# Patient Record
Sex: Female | Born: 1960
Health system: Southern US, Community
[De-identification: ages and names within clinical notes are randomized; demographics above are authoritative.]

## PROBLEM LIST (undated history)

## (undated) DIAGNOSIS — Z87898 Personal history of other specified conditions: Secondary | ICD-10-CM

## (undated) DIAGNOSIS — R51 Headache: Secondary | ICD-10-CM

## (undated) DIAGNOSIS — M545 Low back pain, unspecified: Secondary | ICD-10-CM

## (undated) DIAGNOSIS — Z8614 Personal history of Methicillin resistant Staphylococcus aureus infection: Secondary | ICD-10-CM

## (undated) DIAGNOSIS — M869 Osteomyelitis, unspecified: Secondary | ICD-10-CM

## (undated) DIAGNOSIS — Z8701 Personal history of pneumonia (recurrent): Secondary | ICD-10-CM

## (undated) DIAGNOSIS — E876 Hypokalemia: Secondary | ICD-10-CM

## (undated) DIAGNOSIS — E669 Obesity, unspecified: Secondary | ICD-10-CM

## (undated) DIAGNOSIS — Z419 Encounter for procedure for purposes other than remedying health state, unspecified: Secondary | ICD-10-CM

## (undated) DIAGNOSIS — F329 Major depressive disorder, single episode, unspecified: Secondary | ICD-10-CM

## (undated) DIAGNOSIS — R000 Tachycardia, unspecified: Secondary | ICD-10-CM

## (undated) DIAGNOSIS — I739 Peripheral vascular disease, unspecified: Secondary | ICD-10-CM

## (undated) DIAGNOSIS — I639 Cerebral infarction, unspecified: Secondary | ICD-10-CM

## (undated) DIAGNOSIS — R0602 Shortness of breath: Secondary | ICD-10-CM

## (undated) DIAGNOSIS — K219 Gastro-esophageal reflux disease without esophagitis: Secondary | ICD-10-CM

## (undated) DIAGNOSIS — F419 Anxiety disorder, unspecified: Secondary | ICD-10-CM

## (undated) DIAGNOSIS — D649 Anemia, unspecified: Secondary | ICD-10-CM

## (undated) DIAGNOSIS — R339 Retention of urine, unspecified: Secondary | ICD-10-CM

## (undated) DIAGNOSIS — N390 Urinary tract infection, site not specified: Secondary | ICD-10-CM

## (undated) DIAGNOSIS — F32A Depression, unspecified: Secondary | ICD-10-CM

## (undated) DIAGNOSIS — E785 Hyperlipidemia, unspecified: Secondary | ICD-10-CM

## (undated) DIAGNOSIS — I10 Essential (primary) hypertension: Secondary | ICD-10-CM

## (undated) DIAGNOSIS — J189 Pneumonia, unspecified organism: Secondary | ICD-10-CM

## (undated) DIAGNOSIS — N19 Unspecified kidney failure: Secondary | ICD-10-CM

## (undated) DIAGNOSIS — G609 Hereditary and idiopathic neuropathy, unspecified: Secondary | ICD-10-CM

## (undated) DIAGNOSIS — E119 Type 2 diabetes mellitus without complications: Secondary | ICD-10-CM

## (undated) DIAGNOSIS — M1990 Unspecified osteoarthritis, unspecified site: Secondary | ICD-10-CM

## (undated) DIAGNOSIS — Z418 Encounter for other procedures for purposes other than remedying health state: Secondary | ICD-10-CM

## (undated) DIAGNOSIS — G4730 Sleep apnea, unspecified: Secondary | ICD-10-CM

## (undated) HISTORY — DX: Unspecified osteoarthritis, unspecified site: M19.90

## (undated) HISTORY — DX: Unspecified kidney failure: N19

## (undated) HISTORY — PX: TOE AMPUTATION: SHX809

## (undated) HISTORY — DX: Type 2 diabetes mellitus without complications: E11.9

## (undated) HISTORY — DX: Low back pain, unspecified: M54.50

## (undated) HISTORY — DX: Retention of urine, unspecified: R33.9

## (undated) HISTORY — DX: Hyperlipidemia, unspecified: E78.5

## (undated) HISTORY — DX: Hereditary and idiopathic neuropathy, unspecified: G60.9

## (undated) HISTORY — DX: Gastro-esophageal reflux disease without esophagitis: K21.9

## (undated) HISTORY — DX: Personal history of pneumonia (recurrent): Z87.01

## (undated) HISTORY — DX: Hypokalemia: E87.6

## (undated) HISTORY — DX: Major depressive disorder, single episode, unspecified: F32.9

## (undated) HISTORY — DX: Anxiety disorder, unspecified: F41.9

## (undated) HISTORY — DX: Encounter for procedure for purposes other than remedying health state, unspecified: Z41.9

## (undated) HISTORY — DX: Depression, unspecified: F32.A

## (undated) HISTORY — DX: Anemia, unspecified: D64.9

## (undated) HISTORY — DX: Osteomyelitis, unspecified: M86.9

## (undated) HISTORY — DX: Personal history of other specified conditions: Z87.898

## (undated) HISTORY — PX: DILATION AND CURETTAGE OF UTERUS: SHX78

## (undated) HISTORY — DX: Obesity, unspecified: E66.9

## (undated) HISTORY — PX: TYMPANOPLASTY: SHX33

## (undated) HISTORY — DX: Headache: R51

## (undated) HISTORY — DX: Personal history of Methicillin resistant Staphylococcus aureus infection: Z86.14

## (undated) HISTORY — DX: Cerebral infarction, unspecified: I63.9

## (undated) HISTORY — DX: Low back pain: M54.5

## (undated) HISTORY — DX: Encounter for other procedures for purposes other than remedying health state: Z41.8

## (undated) HISTORY — DX: Sleep apnea, unspecified: G47.30

## (undated) HISTORY — DX: Essential (primary) hypertension: I10

---

## 1997-09-15 ENCOUNTER — Emergency Department (HOSPITAL_COMMUNITY): Admission: EM | Admit: 1997-09-15 | Discharge: 1997-09-15 | Payer: Self-pay | Admitting: Emergency Medicine

## 1997-10-01 ENCOUNTER — Emergency Department (HOSPITAL_COMMUNITY): Admission: EM | Admit: 1997-10-01 | Discharge: 1997-10-01 | Payer: Self-pay | Admitting: Emergency Medicine

## 1999-12-05 ENCOUNTER — Emergency Department (HOSPITAL_COMMUNITY): Admission: EM | Admit: 1999-12-05 | Discharge: 1999-12-05 | Payer: Self-pay | Admitting: Emergency Medicine

## 1999-12-05 ENCOUNTER — Encounter: Payer: Self-pay | Admitting: Emergency Medicine

## 2000-01-09 ENCOUNTER — Encounter: Admission: RE | Admit: 2000-01-09 | Discharge: 2000-04-08 | Payer: Self-pay | Admitting: Internal Medicine

## 2000-07-25 ENCOUNTER — Encounter: Admission: RE | Admit: 2000-07-25 | Discharge: 2000-10-23 | Payer: Self-pay | Admitting: Internal Medicine

## 2006-08-30 ENCOUNTER — Emergency Department (HOSPITAL_COMMUNITY): Admission: EM | Admit: 2006-08-30 | Discharge: 2006-08-30 | Payer: Self-pay | Admitting: Emergency Medicine

## 2006-10-11 ENCOUNTER — Ambulatory Visit: Payer: Self-pay | Admitting: Family Medicine

## 2006-10-11 LAB — CONVERTED CEMR LAB
Albumin: 3.3 g/dL — ABNORMAL LOW (ref 3.5–5.2)
Basophils Absolute: 0.1 10*3/uL (ref 0.0–0.1)
Bilirubin, Direct: 0.1 mg/dL (ref 0.0–0.3)
Cholesterol: 189 mg/dL (ref 0–200)
Creatinine,U: 182.6 mg/dL
Eosinophils Absolute: 0 10*3/uL (ref 0.0–0.6)
Eosinophils Relative: 0.7 % (ref 0.0–5.0)
GFR calc non Af Amer: 115 mL/min
Glucose, Bld: 290 mg/dL — ABNORMAL HIGH (ref 70–99)
HCT: 39.4 % (ref 36.0–46.0)
Hemoglobin: 13 g/dL (ref 12.0–15.0)
Hgb A1c MFr Bld: 12.1 % — ABNORMAL HIGH (ref 4.6–6.0)
Lymphocytes Relative: 39.4 % (ref 12.0–46.0)
MCHC: 33.1 g/dL (ref 30.0–36.0)
MCV: 88.5 fL (ref 78.0–100.0)
Microalb, Ur: 1.4 mg/dL (ref 0.0–1.9)
Monocytes Absolute: 0.6 10*3/uL (ref 0.2–0.7)
Neutro Abs: 3.5 10*3/uL (ref 1.4–7.7)
Neutrophils Relative %: 50.4 % (ref 43.0–77.0)
Potassium: 3.9 meq/L (ref 3.5–5.1)
Sodium: 136 meq/L (ref 135–145)
TSH: 3.18 microintl units/mL (ref 0.35–5.50)
Total Bilirubin: 1.1 mg/dL (ref 0.3–1.2)
Total CHOL/HDL Ratio: 5.1
WBC: 6.9 10*3/uL (ref 4.5–10.5)

## 2006-10-17 ENCOUNTER — Encounter: Admission: RE | Admit: 2006-10-17 | Discharge: 2007-01-15 | Payer: Self-pay | Admitting: Internal Medicine

## 2007-01-03 DIAGNOSIS — F329 Major depressive disorder, single episode, unspecified: Secondary | ICD-10-CM

## 2007-01-03 DIAGNOSIS — R51 Headache: Secondary | ICD-10-CM

## 2007-01-03 DIAGNOSIS — E785 Hyperlipidemia, unspecified: Secondary | ICD-10-CM

## 2007-01-03 DIAGNOSIS — M545 Low back pain, unspecified: Secondary | ICD-10-CM | POA: Insufficient documentation

## 2007-01-03 DIAGNOSIS — F411 Generalized anxiety disorder: Secondary | ICD-10-CM

## 2007-01-03 DIAGNOSIS — R519 Headache, unspecified: Secondary | ICD-10-CM | POA: Insufficient documentation

## 2007-01-03 DIAGNOSIS — E118 Type 2 diabetes mellitus with unspecified complications: Secondary | ICD-10-CM

## 2007-01-03 DIAGNOSIS — M1990 Unspecified osteoarthritis, unspecified site: Secondary | ICD-10-CM | POA: Insufficient documentation

## 2007-01-03 DIAGNOSIS — K219 Gastro-esophageal reflux disease without esophagitis: Secondary | ICD-10-CM

## 2007-01-07 ENCOUNTER — Ambulatory Visit: Payer: Self-pay | Admitting: Family Medicine

## 2007-01-07 DIAGNOSIS — G609 Hereditary and idiopathic neuropathy, unspecified: Secondary | ICD-10-CM | POA: Insufficient documentation

## 2007-05-04 ENCOUNTER — Emergency Department (HOSPITAL_COMMUNITY): Admission: EM | Admit: 2007-05-04 | Discharge: 2007-05-04 | Payer: Self-pay | Admitting: Emergency Medicine

## 2008-06-23 ENCOUNTER — Ambulatory Visit: Payer: Self-pay | Admitting: *Deleted

## 2008-06-23 ENCOUNTER — Ambulatory Visit: Payer: Self-pay | Admitting: Cardiology

## 2008-06-23 ENCOUNTER — Inpatient Hospital Stay (HOSPITAL_COMMUNITY): Admission: EM | Admit: 2008-06-23 | Discharge: 2008-07-06 | Payer: Self-pay | Admitting: Emergency Medicine

## 2008-06-24 ENCOUNTER — Encounter (INDEPENDENT_AMBULATORY_CARE_PROVIDER_SITE_OTHER): Payer: Self-pay | Admitting: Orthopedic Surgery

## 2008-06-24 ENCOUNTER — Encounter (INDEPENDENT_AMBULATORY_CARE_PROVIDER_SITE_OTHER): Payer: Self-pay | Admitting: *Deleted

## 2008-06-25 ENCOUNTER — Ambulatory Visit: Payer: Self-pay | Admitting: Vascular Surgery

## 2008-06-28 ENCOUNTER — Encounter (INDEPENDENT_AMBULATORY_CARE_PROVIDER_SITE_OTHER): Payer: Self-pay | Admitting: *Deleted

## 2008-06-30 ENCOUNTER — Ambulatory Visit: Payer: Self-pay | Admitting: Physical Medicine & Rehabilitation

## 2008-07-06 ENCOUNTER — Inpatient Hospital Stay (HOSPITAL_COMMUNITY)
Admission: RE | Admit: 2008-07-06 | Discharge: 2008-07-19 | Payer: Self-pay | Admitting: Physical Medicine & Rehabilitation

## 2008-07-06 ENCOUNTER — Ambulatory Visit: Payer: Self-pay | Admitting: Physical Medicine & Rehabilitation

## 2008-08-23 ENCOUNTER — Encounter
Admission: RE | Admit: 2008-08-23 | Discharge: 2008-08-24 | Payer: Self-pay | Admitting: Physical Medicine & Rehabilitation

## 2008-08-24 ENCOUNTER — Ambulatory Visit: Payer: Self-pay | Admitting: Physical Medicine & Rehabilitation

## 2008-08-25 ENCOUNTER — Ambulatory Visit: Payer: Self-pay | Admitting: Family Medicine

## 2008-08-27 ENCOUNTER — Encounter: Payer: Self-pay | Admitting: Family Medicine

## 2008-08-27 LAB — CONVERTED CEMR LAB
Alkaline Phosphatase: 74 units/L (ref 39–117)
BUN: 17 mg/dL (ref 6–23)
Basophils Absolute: 0 10*3/uL (ref 0.0–0.1)
Basophils Relative: 0.7 % (ref 0.0–3.0)
Bilirubin, Direct: 0.1 mg/dL (ref 0.0–0.3)
CO2: 29 meq/L (ref 19–32)
Calcium: 9 mg/dL (ref 8.4–10.5)
Chloride: 100 meq/L (ref 96–112)
Creatinine, Ser: 0.8 mg/dL (ref 0.4–1.2)
Creatinine,U: 65.4 mg/dL
Eosinophils Absolute: 0 10*3/uL (ref 0.0–0.7)
Hgb A1c MFr Bld: 10.3 % — ABNORMAL HIGH (ref 4.6–6.5)
Lymphocytes Relative: 33.7 % (ref 12.0–46.0)
MCHC: 33.4 g/dL (ref 30.0–36.0)
MCV: 87.1 fL (ref 78.0–100.0)
Microalb Creat Ratio: 122.3 mg/g — ABNORMAL HIGH (ref 0.0–30.0)
Microalb, Ur: 8 mg/dL — ABNORMAL HIGH (ref 0.0–1.9)
Monocytes Absolute: 0.4 10*3/uL (ref 0.1–1.0)
Neutrophils Relative %: 57.5 % (ref 43.0–77.0)
Platelets: 321 10*3/uL (ref 150.0–400.0)
RBC: 4.1 M/uL (ref 3.87–5.11)
Total Bilirubin: 0.6 mg/dL (ref 0.3–1.2)

## 2008-09-07 ENCOUNTER — Encounter: Payer: Self-pay | Admitting: Family Medicine

## 2009-03-22 ENCOUNTER — Encounter
Admission: RE | Admit: 2009-03-22 | Discharge: 2009-03-25 | Payer: Self-pay | Admitting: Physical Medicine & Rehabilitation

## 2009-03-25 ENCOUNTER — Ambulatory Visit: Payer: Self-pay | Admitting: Physical Medicine & Rehabilitation

## 2009-11-18 ENCOUNTER — Emergency Department (HOSPITAL_COMMUNITY): Admission: EM | Admit: 2009-11-18 | Discharge: 2009-11-18 | Payer: Self-pay | Admitting: Emergency Medicine

## 2010-03-28 ENCOUNTER — Emergency Department (HOSPITAL_COMMUNITY): Admission: EM | Admit: 2010-03-28 | Discharge: 2010-03-28 | Payer: Self-pay | Admitting: Emergency Medicine

## 2010-06-10 ENCOUNTER — Inpatient Hospital Stay (HOSPITAL_COMMUNITY)
Admission: EM | Admit: 2010-06-10 | Discharge: 2010-06-19 | Disposition: A | Discharge status: Rehabilitation Facility | Payer: Self-pay | Source: Home / Self Care | Attending: Internal Medicine | Admitting: Internal Medicine

## 2010-06-11 ENCOUNTER — Encounter (INDEPENDENT_AMBULATORY_CARE_PROVIDER_SITE_OTHER): Payer: Self-pay | Admitting: Internal Medicine

## 2010-06-11 ENCOUNTER — Encounter: Payer: Self-pay | Admitting: Internal Medicine

## 2010-06-12 ENCOUNTER — Inpatient Hospital Stay (HOSPITAL_COMMUNITY)
Admission: EM | Admit: 2010-06-12 | Discharge: 2010-06-19 | Disposition: A | Discharge status: Rehabilitation Facility | Payer: Self-pay | Attending: Internal Medicine | Admitting: Internal Medicine

## 2010-06-12 ENCOUNTER — Encounter (INDEPENDENT_AMBULATORY_CARE_PROVIDER_SITE_OTHER): Payer: Self-pay | Admitting: Internal Medicine

## 2010-06-12 ENCOUNTER — Encounter: Payer: Self-pay | Admitting: Cardiovascular Disease

## 2010-06-12 ENCOUNTER — Other Ambulatory Visit: Payer: Self-pay | Admitting: Internal Medicine

## 2010-06-13 ENCOUNTER — Encounter: Payer: Self-pay | Admitting: Internal Medicine

## 2010-06-14 ENCOUNTER — Encounter (INDEPENDENT_AMBULATORY_CARE_PROVIDER_SITE_OTHER): Payer: Self-pay | Admitting: *Deleted

## 2010-06-14 LAB — CBC
HCT: 29.8 % — ABNORMAL LOW (ref 36.0–46.0)
Hemoglobin: 9.9 g/dL — ABNORMAL LOW (ref 12.0–15.0)
MCH: 28.2 pg (ref 26.0–34.0)
MCHC: 33.2 g/dL (ref 30.0–36.0)
MCV: 84.9 fL (ref 78.0–100.0)
Platelets: 290 10*3/uL (ref 150–400)
RBC: 3.51 MIL/uL — ABNORMAL LOW (ref 3.87–5.11)
RDW: 13.1 % (ref 11.5–15.5)
WBC: 9.5 10*3/uL (ref 4.0–10.5)

## 2010-06-14 LAB — GLUCOSE, CAPILLARY
Glucose-Capillary: 165 mg/dL — ABNORMAL HIGH (ref 70–99)
Glucose-Capillary: 164 mg/dL — ABNORMAL HIGH (ref 70–99)
Glucose-Capillary: 173 mg/dL — ABNORMAL HIGH (ref 70–99)
Glucose-Capillary: 116 mg/dL — ABNORMAL HIGH (ref 70–99)

## 2010-06-15 LAB — GLUCOSE, CAPILLARY
Glucose-Capillary: 123 mg/dL — ABNORMAL HIGH (ref 70–99)
Glucose-Capillary: 120 mg/dL — ABNORMAL HIGH (ref 70–99)
Glucose-Capillary: 165 mg/dL — ABNORMAL HIGH (ref 70–99)

## 2010-06-16 ENCOUNTER — Telehealth (INDEPENDENT_AMBULATORY_CARE_PROVIDER_SITE_OTHER): Payer: Self-pay | Admitting: *Deleted

## 2010-06-16 LAB — GLUCOSE, CAPILLARY
Glucose-Capillary: 138 mg/dL — ABNORMAL HIGH (ref 70–99)
Glucose-Capillary: 132 mg/dL — ABNORMAL HIGH (ref 70–99)
Glucose-Capillary: 153 mg/dL — ABNORMAL HIGH (ref 70–99)

## 2010-06-19 ENCOUNTER — Inpatient Hospital Stay (HOSPITAL_COMMUNITY)
Admission: RE | Admit: 2010-06-19 | Discharge: 2010-06-24 | Payer: Self-pay | Attending: Physical Medicine & Rehabilitation | Admitting: Physical Medicine & Rehabilitation

## 2010-06-26 LAB — DIFFERENTIAL
Basophils Absolute: 0 10*3/uL (ref 0.0–0.1)
Basophils Relative: 0 % (ref 0–1)
Eosinophils Absolute: 0.1 10*3/uL (ref 0.0–0.7)
Eosinophils Relative: 2 % (ref 0–5)
Lymphocytes Relative: 33 % (ref 12–46)
Lymphs Abs: 2.2 10*3/uL (ref 0.7–4.0)
Monocytes Absolute: 0.6 10*3/uL (ref 0.1–1.0)
Monocytes Relative: 9 % (ref 3–12)
Neutro Abs: 3.8 10*3/uL (ref 1.7–7.7)
Neutrophils Relative %: 56 % (ref 43–77)

## 2010-06-26 LAB — GLUCOSE, CAPILLARY
Glucose-Capillary: 145 mg/dL — ABNORMAL HIGH (ref 70–99)
Glucose-Capillary: 183 mg/dL — ABNORMAL HIGH (ref 70–99)
Glucose-Capillary: 144 mg/dL — ABNORMAL HIGH (ref 70–99)
Glucose-Capillary: 152 mg/dL — ABNORMAL HIGH (ref 70–99)
Glucose-Capillary: 174 mg/dL — ABNORMAL HIGH (ref 70–99)
Glucose-Capillary: 119 mg/dL — ABNORMAL HIGH (ref 70–99)
Glucose-Capillary: 124 mg/dL — ABNORMAL HIGH (ref 70–99)
Glucose-Capillary: 143 mg/dL — ABNORMAL HIGH (ref 70–99)
Glucose-Capillary: 146 mg/dL — ABNORMAL HIGH (ref 70–99)
Glucose-Capillary: 150 mg/dL — ABNORMAL HIGH (ref 70–99)
Glucose-Capillary: 154 mg/dL — ABNORMAL HIGH (ref 70–99)
Glucose-Capillary: 155 mg/dL — ABNORMAL HIGH (ref 70–99)
Glucose-Capillary: 157 mg/dL — ABNORMAL HIGH (ref 70–99)
Glucose-Capillary: 168 mg/dL — ABNORMAL HIGH (ref 70–99)
Glucose-Capillary: 107 mg/dL — ABNORMAL HIGH (ref 70–99)
Glucose-Capillary: 119 mg/dL — ABNORMAL HIGH (ref 70–99)
Glucose-Capillary: 132 mg/dL — ABNORMAL HIGH (ref 70–99)
Glucose-Capillary: 143 mg/dL — ABNORMAL HIGH (ref 70–99)
Glucose-Capillary: 145 mg/dL — ABNORMAL HIGH (ref 70–99)
Glucose-Capillary: 145 mg/dL — ABNORMAL HIGH (ref 70–99)
Glucose-Capillary: 152 mg/dL — ABNORMAL HIGH (ref 70–99)
Glucose-Capillary: 154 mg/dL — ABNORMAL HIGH (ref 70–99)
Glucose-Capillary: 163 mg/dL — ABNORMAL HIGH (ref 70–99)
Glucose-Capillary: 168 mg/dL — ABNORMAL HIGH (ref 70–99)
Glucose-Capillary: 181 mg/dL — ABNORMAL HIGH (ref 70–99)
Glucose-Capillary: 187 mg/dL — ABNORMAL HIGH (ref 70–99)
Glucose-Capillary: 198 mg/dL — ABNORMAL HIGH (ref 70–99)
Glucose-Capillary: 121 mg/dL — ABNORMAL HIGH (ref 70–99)
Glucose-Capillary: 126 mg/dL — ABNORMAL HIGH (ref 70–99)
Glucose-Capillary: 127 mg/dL — ABNORMAL HIGH (ref 70–99)
Glucose-Capillary: 148 mg/dL — ABNORMAL HIGH (ref 70–99)
Glucose-Capillary: 172 mg/dL — ABNORMAL HIGH (ref 70–99)

## 2010-06-26 LAB — CBC
HCT: 27.3 % — ABNORMAL LOW (ref 36.0–46.0)
HCT: 27.9 % — ABNORMAL LOW (ref 36.0–46.0)
HCT: 27.5 % — ABNORMAL LOW (ref 36.0–46.0)
HCT: 28.3 % — ABNORMAL LOW (ref 36.0–46.0)
Hemoglobin: 9.2 g/dL — ABNORMAL LOW (ref 12.0–15.0)
Hemoglobin: 9.1 g/dL — ABNORMAL LOW (ref 12.0–15.0)
Hemoglobin: 9 g/dL — ABNORMAL LOW (ref 12.0–15.0)
Hemoglobin: 9.3 g/dL — ABNORMAL LOW (ref 12.0–15.0)
MCH: 28.6 pg (ref 26.0–34.0)
MCH: 28 pg (ref 26.0–34.0)
MCH: 28.1 pg (ref 26.0–34.0)
MCH: 28.3 pg (ref 26.0–34.0)
MCHC: 33.7 g/dL (ref 30.0–36.0)
MCHC: 32.6 g/dL (ref 30.0–36.0)
MCHC: 32.7 g/dL (ref 30.0–36.0)
MCHC: 32.9 g/dL (ref 30.0–36.0)
MCV: 84.8 fL (ref 78.0–100.0)
MCV: 85.8 fL (ref 78.0–100.0)
MCV: 85.9 fL (ref 78.0–100.0)
MCV: 86 fL (ref 78.0–100.0)
Platelets: 336 10*3/uL (ref 150–400)
Platelets: 333 10*3/uL (ref 150–400)
Platelets: 330 10*3/uL (ref 150–400)
Platelets: 381 10*3/uL (ref 150–400)
RBC: 3.22 MIL/uL — ABNORMAL LOW (ref 3.87–5.11)
RBC: 3.25 MIL/uL — ABNORMAL LOW (ref 3.87–5.11)
RBC: 3.2 MIL/uL — ABNORMAL LOW (ref 3.87–5.11)
RBC: 3.29 MIL/uL — ABNORMAL LOW (ref 3.87–5.11)
RDW: 12.9 % (ref 11.5–15.5)
RDW: 13.5 % (ref 11.5–15.5)
RDW: 13.6 % (ref 11.5–15.5)
RDW: 13.9 % (ref 11.5–15.5)
WBC: 7.3 10*3/uL (ref 4.0–10.5)
WBC: 7.3 10*3/uL (ref 4.0–10.5)
WBC: 6.8 10*3/uL (ref 4.0–10.5)
WBC: 5 10*3/uL (ref 4.0–10.5)

## 2010-06-26 LAB — FERRITIN: Ferritin: 313 ng/mL — ABNORMAL HIGH (ref 10–291)

## 2010-06-26 LAB — BASIC METABOLIC PANEL
BUN: 8 mg/dL (ref 6–23)
BUN: 8 mg/dL (ref 6–23)
BUN: 8 mg/dL (ref 6–23)
BUN: 10 mg/dL (ref 6–23)
CO2: 22 mEq/L (ref 19–32)
CO2: 24 mEq/L (ref 19–32)
CO2: 23 mEq/L (ref 19–32)
CO2: 24 mEq/L (ref 19–32)
Calcium: 8 mg/dL — ABNORMAL LOW (ref 8.4–10.5)
Calcium: 8 mg/dL — ABNORMAL LOW (ref 8.4–10.5)
Calcium: 8.2 mg/dL — ABNORMAL LOW (ref 8.4–10.5)
Calcium: 8.4 mg/dL (ref 8.4–10.5)
Chloride: 103 mEq/L (ref 96–112)
Chloride: 107 mEq/L (ref 96–112)
Chloride: 110 mEq/L (ref 96–112)
Chloride: 111 mEq/L (ref 96–112)
Creatinine, Ser: 1.07 mg/dL (ref 0.4–1.2)
Creatinine, Ser: 1.11 mg/dL (ref 0.4–1.2)
Creatinine, Ser: 1.19 mg/dL (ref 0.4–1.2)
Creatinine, Ser: 1.22 mg/dL — ABNORMAL HIGH (ref 0.4–1.2)
GFR calc Af Amer: >60 mL/min (ref >60)
GFR calc Af Amer: >60 mL/min (ref >60)
GFR calc Af Amer: 59 mL/min — ABNORMAL LOW (ref >60)
GFR calc Af Amer: 57 mL/min — ABNORMAL LOW (ref >60)
GFR calc non Af Amer: 55 mL/min — ABNORMAL LOW (ref >60)
GFR calc non Af Amer: 53 mL/min — ABNORMAL LOW (ref >60)
GFR calc non Af Amer: 49 mL/min — ABNORMAL LOW (ref >60)
GFR calc non Af Amer: 47 mL/min — ABNORMAL LOW (ref >60)
Glucose, Bld: 138 mg/dL — ABNORMAL HIGH (ref 70–99)
Glucose, Bld: 150 mg/dL — ABNORMAL HIGH (ref 70–99)
Glucose, Bld: 120 mg/dL — ABNORMAL HIGH (ref 70–99)
Glucose, Bld: 153 mg/dL — ABNORMAL HIGH (ref 70–99)
Potassium: 3 mEq/L — ABNORMAL LOW (ref 3.5–5.1)
Potassium: 2.9 mEq/L — ABNORMAL LOW (ref 3.5–5.1)
Potassium: 3.5 mEq/L (ref 3.5–5.1)
Potassium: 3.9 mEq/L (ref 3.5–5.1)
Sodium: 133 mEq/L — ABNORMAL LOW (ref 135–145)
Sodium: 140 mEq/L (ref 135–145)
Sodium: 139 mEq/L (ref 135–145)
Sodium: 140 mEq/L (ref 135–145)

## 2010-06-26 LAB — COMPREHENSIVE METABOLIC PANEL
ALT: 18 U/L (ref 0–35)
AST: 19 U/L (ref 0–37)
Albumin: 2.1 g/dL — ABNORMAL LOW (ref 3.5–5.2)
Alkaline Phosphatase: 53 U/L (ref 39–117)
BUN: 8 mg/dL (ref 6–23)
CO2: 23 mEq/L (ref 19–32)
Calcium: 8 mg/dL — ABNORMAL LOW (ref 8.4–10.5)
Chloride: 110 mEq/L (ref 96–112)
Creatinine, Ser: 1.3 mg/dL — ABNORMAL HIGH (ref 0.4–1.2)
GFR calc Af Amer: 53 mL/min — ABNORMAL LOW (ref >60)
GFR calc non Af Amer: 44 mL/min — ABNORMAL LOW (ref >60)
Glucose, Bld: 134 mg/dL — ABNORMAL HIGH (ref 70–99)
Potassium: 3.4 mEq/L — ABNORMAL LOW (ref 3.5–5.1)
Sodium: 138 mEq/L (ref 135–145)
Total Bilirubin: 0.7 mg/dL (ref 0.3–1.2)
Total Protein: 6 g/dL (ref 6.0–8.3)

## 2010-06-26 LAB — VITAMIN B12: Vitamin B-12: 1037 pg/mL — ABNORMAL HIGH (ref 211–911)

## 2010-06-26 LAB — URINALYSIS, ROUTINE W REFLEX MICROSCOPIC
Bilirubin Urine: NEGATIVE
Ketones, ur: NEGATIVE mg/dL
Leukocytes, UA: NEGATIVE
Nitrite: NEGATIVE
Protein, ur: >300 mg/dL — AB
Specific Gravity, Urine: 1.016 (ref 1.005–1.030)
Urine Glucose, Fasting: 100 mg/dL — AB
Urobilinogen, UA: 0.2 mg/dL (ref 0.0–1.0)
pH: 6 (ref 5.0–8.0)

## 2010-06-26 LAB — URINE MICROSCOPIC-ADD ON

## 2010-06-26 LAB — IRON AND TIBC
Iron: 15 ug/dL — ABNORMAL LOW (ref 42–135)
Saturation Ratios: 10 % — ABNORMAL LOW (ref 20–55)
TIBC: 154 ug/dL — ABNORMAL LOW (ref 250–470)
UIBC: 139 ug/dL

## 2010-06-26 LAB — URINE CULTURE
Colony Count: NO GROWTH
Culture  Setup Time: 201201092235
Culture: NO GROWTH

## 2010-06-26 LAB — BRAIN NATRIURETIC PEPTIDE: Pro B Natriuretic peptide (BNP): 173 pg/mL — ABNORMAL HIGH (ref 0.0–100.0)

## 2010-06-26 LAB — MAGNESIUM: Magnesium: 1.7 mg/dL (ref 1.5–2.5)

## 2010-06-26 LAB — FOLATE: Folate: 8.4 ng/mL

## 2010-06-27 ENCOUNTER — Encounter
Admission: RE | Admit: 2010-06-27 | Discharge: 2010-07-11 | Payer: Self-pay | Source: Home / Self Care | Attending: Physical Medicine & Rehabilitation | Admitting: Physical Medicine & Rehabilitation

## 2010-06-30 NOTE — H&P (Signed)
NAMEMAGNOLIA, Murphy NO.:  [id number]  MEDICAL RECORD NO.:  [id number]          PATIENT TYPE:  IPS  LOCATION:  [id number]                         FACILITY:  [facility]  PHYSICIAN:  Meredith Staggers, M.D.DATE OF BIRTH:  07-25-1960  DATE OF ADMISSION:  06/19/2010 DATE OF DISCHARGE:                             HISTORY & PHYSICAL   CHIEF COMPLAINT:  Weakness and word-finding problems with confusion.  NEUROLOGIST:  Andrey Spearman, MD.  HISTORY OF PRESENT ILLNESS:  This is a 50 year old African American female, known to the rehab service from prior admission with history of diabetes and hypertension; admitted on December 31 with nausea, vomiting, and hematemesis.  Endoscopy revealed distal esophagitis.  Head CT on admission did not show any acute changes.  The patient, on January 2, developed problems with speech and processing.  Head CT showed evolving infarct in the left parietal lobe.  MRI/MRA of the brain showed confluent left parietal and posterior temporal infarcts with multiple focal infarcts in the bilateral cerebral hemispheres consistent with embolic versus hypotensive watershed event.  The patient had shortness of breath and cough on January 3, and chest x-ray showed extensive bilateral airspace disease, pulmonary edema versus pneumonia.  She was started on IV Zosyn, treated with IV Lasix.  Followup chest x-ray on January 4 showed bilateral airspace disease with increased confluence. She was changed ultimately to clindamycin today.  Carotid Dopplers were negative for ICA stenosis.  TEE on January 10 did show no evidence of PFO; 2-D echo showed 50% to 55% ejection fraction, no wall abnormalities.  Speech Therapy saw the patient, placed her on a regular diet.  Rehab was asked to evaluate the patient and felt ultimately that she could benefit from inpatient rehab stay.  REVIEW OF SYSTEMS:  Notable for weakness, low back pain, insomnia, which is chronic.  Full  12-point review is in written H and P, and other pertinent positives are above.  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  FAMILY HISTORY:  Positive for diabetes and CAD.  SOCIAL HISTORY:  The patient lives with sister in a one-level house with three steps to enter.  She worked in [employer], with multiple back surgeries and injuries has been working on disability.  She does not smoke and occasionally drinks.  ALLERGIES:  VANCOMYCIN.  HOME MEDICATIONS:  None.  LABORATORY DATA:  Please see written H and P.  PHYSICAL EXAMINATION:  VITAL SIGNS:  Blood pressure is 169/93, pulse 90, respiratory rate 16, and temperature 99.0. GENERAL:  The patient is pleasant, alert, oriented x3. HEENT:  Pupils equal, round, reactive to light.  Ear, nose, and throat exam is notable for partial upper dentures and multiple lower teeth missing.  Mucosa is pink and moist. NECK:  Supple.  No JVD or lymphadenopathy. CHEST:  Clear to auscultation except for at the bases. HEART:  Regular rate and rhythm without murmur, rubs, or gallops. ABDOMEN:  Soft, nontender.  Bowel sounds are positive. SKIN:  Generally intact. NEUROLOGIC:  Cranial nerves II-XII are within normal limits.  Judgment, orientation, memory, and mood are appropriate.  She did need cues for month and day although a lot of problem with language.  The patient with significant issues with processing and apraxia with speech and motor function.  She was able to shift attention for simple task, but had more difficulty for more complex tasks.  Strength was generally 4/5 in the upper extremities with more planting problems on the right.  Lower extremity strength was 4/5 proximal; distal with more  planting problems, right and left.  POST-ADMISSION PHYSICIAN EVALUATION: 1. Functional deficit secondary to left parietal and bilateral     cerebral infarcts, essentially due to watershed event. 2. The patient is admitted to receive collaborative interdisciplinary     care between the physiatrist, rehab nursing staff, and therapy     team. 3. The patient's level of medical complexity and substantial therapy     needs in context of that medical necessity cannot be provided at a     lesser intensity of care. 4. The patient has experienced substantial functional loss from her     baseline.  Upon functional assessment at the time of preadmission     screening, patient was min assist transfers, min to guard assist     ambulating, min assist bathing upper body, mod assist dressing     lower body.  She is min to mod assist currently with self-care and     mobility.  Judging by the patient's diagnosis, physical exam, and     functional history, she has the potential for functional progress     which will result in measurable gains while inpatient rehab.  These     gains will be of substantial and practical use upon discharge to     home and facilitate mobility and self-care. 5. The physiatrist to provide 24-hour management of medical needs as     well as oversight of therapy plans/treatment and provide guidance     as appropriate regarding interaction of the two.  Medical problem     list and plan are below. 6. The 24-hour rehab nursing team will assist and manage the patient's     skin care needs as well as nutrition, pain, integration of therapy     concepts, techniques. 7. PT will assess and treat for lower extremity strength, range of     motion, apraxic difficulties, with goals supervision to modified     independent. 8. OT will assess and treat for upper extremity use, ADLs, adaptive     techniques, equipment, functional mobility, apraxia, cognitive-     perceptual training, and  neuromuscular reeducation with goals     modified independent to occasional min assist for set up. 9. Speech and language pathology will assess and treat for language     and cognitive deficits with goals supervision to min assist. Copy and social worker will assess and treat for     psychosocial issues and discharge planning. 11.Team conference will be held weekly to assess progress towards     goals and to determine barriers to discharge. 12.The patient has demonstrated sufficient medical stability and     exercise capacity to tolerate at least 3 hours of therapy per day     at least 5 days per week. 13.Estimated length of stay is 1 week plus.  PROGNOSIS:  Good.  MEDICAL PROBLEM LIST AND PLAN: 1. DVT  prophylaxis, subcu Lovenox. 2. Pain management, p.r.n. Tylenol. 3. Bilateral pneumonia:  This may be secondary to aspiration.  Today     is day six of IV Zosyn.  We will change to p.o. clindamycin as per     recommendations on acute. 4. Diabetes type 2:  Hemoglobin A1c 13.9.  Sugar is under good control     with sliding scale insulin and diet modification.  The patient     needs significant dietary education as well as family. 5. Hypertension:  This is poorly controlled.  We will increase     lisinopril to b.i.d., titrating to a systolic in the [id number] range. 6. Morbid obesity:  Continue pressure relief measures for skin. 7. Fluid overload:  Check BNP.  The patient has been treated with     Lasix.  We will follow in's and out's as well as regular weights     and adjust medication regimen to suit.     Meredith Staggers, M.D.     ZTS/MEDQ  D:  06/19/2010  T:  06/20/2010  Job:  [id number]  cc:   Andrey Spearman, MD  Electronically Signed by Alger Simons M.D. on 06/30/2010 09:59:01 AM

## 2010-07-07 DIAGNOSIS — I10 Essential (primary) hypertension: Secondary | ICD-10-CM | POA: Insufficient documentation

## 2010-07-07 DIAGNOSIS — M869 Osteomyelitis, unspecified: Secondary | ICD-10-CM | POA: Insufficient documentation

## 2010-07-07 DIAGNOSIS — G4730 Sleep apnea, unspecified: Secondary | ICD-10-CM | POA: Insufficient documentation

## 2010-07-07 DIAGNOSIS — I6350 Cerebral infarction due to unspecified occlusion or stenosis of unspecified cerebral artery: Secondary | ICD-10-CM | POA: Insufficient documentation

## 2010-07-07 DIAGNOSIS — Z86718 Personal history of other venous thrombosis and embolism: Secondary | ICD-10-CM | POA: Insufficient documentation

## 2010-07-07 DIAGNOSIS — D649 Anemia, unspecified: Secondary | ICD-10-CM | POA: Insufficient documentation

## 2010-07-07 DIAGNOSIS — N184 Chronic kidney disease, stage 4 (severe): Secondary | ICD-10-CM

## 2010-07-07 DIAGNOSIS — N179 Acute kidney failure, unspecified: Secondary | ICD-10-CM | POA: Insufficient documentation

## 2010-07-07 DIAGNOSIS — A4902 Methicillin resistant Staphylococcus aureus infection, unspecified site: Secondary | ICD-10-CM | POA: Insufficient documentation

## 2010-07-07 NOTE — Consult Note (Addendum)
[name], [name] NO.:  [id number]  MEDICAL RECORD NO.:  [id number]          PATIENT TYPE:  INP  LOCATION:  22                         FACILITY:  [facility]  PHYSICIAN:  Andrey Spearman, MD   DATE OF BIRTH:  18-Jul-1960  DATE OF CONSULTATION: DATE OF DISCHARGE:                                CONSULTATION   CHIEF COMPLAINT:  Language difficulty suspected left middle cerebral artery ischemic infarction.  HISTORY OF PRESENT ILLNESS:  A 50 year old female with uncontrolled hypertension, diabetes, hypercholesterolemia, obesity, who was admitted to [facility] on June 10, 2010 for nausea, vomiting, and hematemesis.  She underwent upper GI endoscopy yesterday on June 11, 2010, was found to have esophagitis.  After her procedure, she was recovering from sedation and her family saw her around 1:00 p.m., noticed that she was somewhat groggy and repeating some words.  By 8 o'clock or 9 o'clock p.m. they noted some significant language problems. At 2 o'clock in the morning, the patient had a CT scan of the head which showed left parietal hypodensity suspicious for ischemic infarction. The patient was then transferred to [facility] for further evaluation.  This morning, the patient continues to have language deficit.  She is awake and alert.  Her family thinks that her language function has slightly improved compared to yesterday evening.  The patient was not on any medications at home prior to this admission except for ibuprofen and Excedrin as needed.  PAST MEDICAL HISTORY:  Hypertension, diabetes, diabetic neuropathy, hypercholesterolemia, obesity, back pain, migraine, depression.  MEDICATIONS AT HOME:  Excedrin Migraine, BC powder and ibuprofen as needed.  In the hospital, she is now on aspirin 325 daily, Carafate, Protonix, Lovenox 40 mg subcu daily, NovoLog and Lantus.  ALLERGIES:  NO KNOWN DRUG ALLERGIES.  FAMILY HISTORY:  Coronary  artery disease, hypertension.  SOCIAL HISTORY:  Denies tobacco, alcohol, illicit was drug use.  REVIEW OF SYSTEMS:  As per HPI.  The patient had been having headaches for the last several days prior to admission.  PHYSICAL EXAMINATION:  VITAL SIGNS:  Blood pressure 157/72, heart rate 121, sinus rhythm, respirations 30, O2 sat 92% on 2 L nasal cannula, temperature 99.8. GENERAL:  She is awake and alert.  She is accompanied at the bedside by some family members.  MENTAL STATUS:  Awake and alert.  She has markedly decreased fluency. She is able to say some phrases with ease.  She does mention to me several times "I know what it is," some perseveration.  She is able to name some simple objects such as cup, but unable to name straw.  She is unable to name most of the objects from [place] stroke scale card except for the club which she calls "hand."  Some of the words and phrases she is able to say out loud, but she is unable to read full sentences.  She is unable to describe the cookie-theft picture.  She does follow some simple commands.  Cranial nerve examination, pupils are reactive from 3- 2 mm.  She has decreased right visual field to confrontation. Extraocular muscles are intact.  Facial  sensation and strength is symmetric.  Uvula is midline.  Shoulders symmetric.  Tongue is midline. Motor examination, normal bulk and tone of bilateral upper and lower extremities.  She has a right parietal drift which moves up word.  No pronator drift.  No drift in lower extremities.  Sensory examination decreased in the right upper and lower extremities to light touch.  She also extinguishes to double simultaneous stimulation on the right side. Cerebellar testing, no ataxia in left upper extremities.  She does have ataxia with a right upper extremity.  Reflexes are trace in the upper and lower extremities.  Downgoing toes.  Gait is not assessed. CARDIOVASCULAR:  The patient has regular rhythm,  tachycardic.  LAB TESTING:  Sodium 141, BUN 13, creatinine 1.2, glucose 179, a hemoglobin A1c of 13.9, platelets 276, H and H of 9.6 over 29.2 white count 9.1.  EKG shows sinus tachycardia.  CT scan of the head which I reviewed from June 12, 2009 at 2:26 a.m. shows a left parietal hypodensity, suspect branch occlusion of the left middle cerebral artery.  ASSESSMENT AND RECOMMENDATIONS:  A 50 year old female with uncontrolled hypertension, diabetes, hypercholesterolemia, obesity who was not on any medications prior to this admission, admitted for nausea, vomiting, hematemesis and diagnosed with esophagitis.  Postprocedure, she was found to have language deficit with CT scan findings and exam findings consistent with branched left middle cerebral artery infarction.  RECOMMENDATIONS: 1. MRI of the brain, MRA of the head. 2. Transthoracic echocardiogram followed by transesophageal     echocardiogram to rule out cardioembolic source. 3. Carotid ultrasound. 4. Fasting lipid profile. 5. Start aspirin 325 mg daily which has been okayed by GI. 6. Speech language therapy evaluation. 7. PT evaluation. 8. Social work [place].  The patient is uninsured, likely needs     to plan for Medicaid.  I discussed my finding with the patient, her family, and other healthcare providers, reviewed imaging myself, recommend additional testing and evaluation of this high-risk medical condition (acute ischemic infarction) which requires high-complexity medical decision making.     Andrey Spearman, MD     VP/MEDQ  D:  06/12/2010  T:  06/12/2010  Job:  [id number]  Electronically Signed by Andrey Spearman  on 07/05/2010 02:56:18 PM

## 2010-07-11 ENCOUNTER — Ambulatory Visit: Admit: 2010-07-11 | Payer: Self-pay | Admitting: Cardiovascular Disease

## 2010-07-11 ENCOUNTER — Other Ambulatory Visit (HOSPITAL_COMMUNITY): Payer: Self-pay | Admitting: Family Medicine

## 2010-07-11 DIAGNOSIS — Z139 Encounter for screening, unspecified: Secondary | ICD-10-CM

## 2010-07-12 ENCOUNTER — Ambulatory Visit: Payer: Self-pay | Admitting: Physical Therapy

## 2010-07-12 ENCOUNTER — Ambulatory Visit: Payer: Self-pay | Attending: Physical Medicine & Rehabilitation | Admitting: Speech Pathology

## 2010-07-12 ENCOUNTER — Encounter: Payer: Self-pay | Admitting: Occupational Therapy

## 2010-07-12 ENCOUNTER — Encounter: Admit: 2010-07-12 | Payer: Self-pay | Admitting: Physical Medicine & Rehabilitation

## 2010-07-13 NOTE — Miscellaneous (Signed)
  Clinical Lists Changes  Observations: Added new observation of EGD: 06/12/2010 (06/14/2010 9:23)

## 2010-07-13 NOTE — Procedures (Signed)
Summary: Upper Endoscopy  Patient: [name] Note: All result statuses are Final unless otherwise noted.  Tests: (1) Upper Endoscopy (EGD)   EGD Upper Endoscopy       DONE     [facility]     [place], [place]  [address]           ENDOSCOPY PROCEDURE REPORT           PATIENT:  [name], [name]  MR#:  [id number]     BIRTHDATE:  04/11/1961, 49 yrs. old  GENDER:  female           ENDOSCOPIST:  Lowella Bandy. Olevia Perches, MD     Referred by:           PROCEDURE DATE:  06/11/2010     PROCEDURE:  EGD with biopsy, 432[address]     ASA CLASS:  Class II     INDICATIONS:  hematemesis, nausea and vomiting intractable     vomiting, suspect diabetic gadtroparesis           MEDICATIONS:   Versed 6 mg, Fentanyl 75 mcg     TOPICAL ANESTHETIC:  Cetacaine Spray           DESCRIPTION OF PROCEDURE:   After the risks benefits and     alternatives of the procedure were thoroughly explained, informed     consent was obtained.  The  endoscope was introduced through the     mouth and advanced to the second portion of the duodenum, without     limitations.  The instrument was slowly withdrawn as the mucosa     was fully examined.     <<PROCEDUREIMAGES>>           Esophagitis was found in the distal esophagus. mild esophagitis,     no Mallory-Weiss tear With standard forceps, a biopsy was obtained     and sent to pathology (see image1, image6, and image5).  A sessile     polyp was found in the bulb of the duodenum. 8 mm sessile polyp in     the duodenal bulb With standard forceps, a biopsy was obtained and     sent to pathology (see image3).  Otherwise the examination was     normal. With standard forceps, a biopsy was obtained and sent to     pathology. r/o H (see image4 and image2).Pylori    Retroflexed     views revealed no abnormalities.    The scope was then withdrawn     from the patient and the procedure completed.           COMPLICATIONS:  None           ENDOSCOPIC IMPRESSION:     1) Esophagitis in the distal esophagus     2) Sessile polyp in the bulb of duodenum     3) Otherwise normal examination     hematemesis was likely caused by forceful vomiting,     RECOMMENDATIONS:     1) Await biopsy results     continue Carafate, PPI, may advance diet     GEScan           REPEAT EXAM:  In 0 year(s) for.           ______________________________     Lowella Bandy. Olevia Perches, MD           CC:           n.  eSIGNED:   Lowella Bandy. Brodie at 06/11/2010 09:03 AM           Juliann Pares, [id number]  Note: An exclamation mark (!) indicates a result that was not dispersed into the flowsheet. Document Creation Date: 06/11/2010 9:03 AM _______________________________________________________________________  (1) Order result status: Final Collection or observation date-time: 06/11/2010 08:52 Requested date-time:  Receipt date-time:  Reported date-time:  Referring Physician:   Ordering Physician: Delfin Edis [phone]) Specimen Source:  Source: Tawanna Cooler Order Number: [id number] Lab site:

## 2010-07-13 NOTE — Letter (Signed)
Summary: Patient Notice-Endo Biopsy Results  [facility]  [place], [place] [address]   Phone: [phone]  Fax: [phone]        June 13, 2010 MRN: [id number]    MELANNI RUMPEL [place], [place]  [address]    Dear Ms. Randie Heinz,  I am pleased to inform you that the biopsies taken during your recent endoscopic examination did not show any evidence of cancer upon pathologic examination.  Additional information/recommendations:  __No further action is needed at this time.  Please follow-up with      your primary care physician for your other healthcare needs.  __ Please call [phone] to schedule a return visit to review      your condition.  _x_ Continue with the treatment plan as outlined on the day of your      exam.     Please call us if you are having persistent problems or have questions about your condition that have not been fully answered at this time.  Sincerely,  Lafayette Dragon MD  This letter has been electronically signed by your physician.  Appended Document: Patient Notice-Endo Biopsy Results Letter mailed to patient.

## 2010-07-13 NOTE — Progress Notes (Signed)
Summary: event monitor  Phone Note Outgoing Call   Call placed by: Susette Racer,  June 16, 2010 9:28 AM Summary of Call: Call patient left messege for her to call back to make appt. for monitor  Follow-up for Phone Call        patient have call back to sch. event monitor let messeges after messege. Follow-up by: Susette Racer,  June 30, 2010 10:56 AM

## 2010-07-18 ENCOUNTER — Ambulatory Visit: Payer: Self-pay | Admitting: Physical Therapy

## 2010-07-18 ENCOUNTER — Ambulatory Visit: Payer: Self-pay | Admitting: Occupational Therapy

## 2010-07-18 ENCOUNTER — Ambulatory Visit: Payer: Self-pay

## 2010-07-19 ENCOUNTER — Encounter (HOSPITAL_COMMUNITY): Payer: Self-pay

## 2010-07-19 ENCOUNTER — Ambulatory Visit (HOSPITAL_COMMUNITY)
Admission: RE | Admit: 2010-07-19 | Discharge: 2010-07-19 | Disposition: A | Discharge status: Home / Self Care | Payer: Worker's Compensation | Source: Ambulatory Visit | Attending: Family Medicine | Admitting: Family Medicine

## 2010-07-19 DIAGNOSIS — Z1231 Encounter for screening mammogram for malignant neoplasm of breast: Secondary | ICD-10-CM | POA: Insufficient documentation

## 2010-07-19 DIAGNOSIS — Z139 Encounter for screening, unspecified: Secondary | ICD-10-CM

## 2010-07-21 ENCOUNTER — Ambulatory Visit: Payer: Self-pay | Admitting: Physical Therapy

## 2010-07-21 ENCOUNTER — Encounter: Payer: Self-pay | Admitting: Occupational Therapy

## 2010-07-25 ENCOUNTER — Inpatient Hospital Stay: Payer: Self-pay | Admitting: Physical Medicine & Rehabilitation

## 2010-07-25 ENCOUNTER — Encounter: Payer: Self-pay | Admitting: Cardiovascular Disease

## 2010-07-25 ENCOUNTER — Encounter (INDEPENDENT_AMBULATORY_CARE_PROVIDER_SITE_OTHER): Payer: Self-pay | Admitting: Cardiovascular Disease

## 2010-07-25 ENCOUNTER — Encounter: Payer: Worker's Compensation | Attending: Physical Medicine & Rehabilitation

## 2010-07-25 DIAGNOSIS — R002 Palpitations: Secondary | ICD-10-CM

## 2010-07-25 DIAGNOSIS — R000 Tachycardia, unspecified: Secondary | ICD-10-CM | POA: Insufficient documentation

## 2010-07-25 DIAGNOSIS — I6789 Other cerebrovascular disease: Secondary | ICD-10-CM

## 2010-07-25 DIAGNOSIS — I10 Essential (primary) hypertension: Secondary | ICD-10-CM

## 2010-07-26 ENCOUNTER — Ambulatory Visit: Payer: Self-pay | Admitting: Speech Pathology

## 2010-07-26 ENCOUNTER — Ambulatory Visit: Payer: Self-pay | Admitting: Physical Therapy

## 2010-07-26 ENCOUNTER — Ambulatory Visit: Payer: Self-pay | Admitting: Occupational Therapy

## 2010-07-28 ENCOUNTER — Telehealth (INDEPENDENT_AMBULATORY_CARE_PROVIDER_SITE_OTHER): Payer: Self-pay | Admitting: *Deleted

## 2010-07-31 ENCOUNTER — Encounter (INDEPENDENT_AMBULATORY_CARE_PROVIDER_SITE_OTHER): Payer: Self-pay

## 2010-07-31 ENCOUNTER — Encounter: Payer: Self-pay | Admitting: Speech Pathology

## 2010-07-31 ENCOUNTER — Encounter: Payer: Self-pay | Admitting: Occupational Therapy

## 2010-07-31 ENCOUNTER — Ambulatory Visit: Payer: Self-pay | Admitting: Physical Therapy

## 2010-07-31 DIAGNOSIS — I472 Ventricular tachycardia: Secondary | ICD-10-CM

## 2010-08-02 ENCOUNTER — Ambulatory Visit: Payer: Self-pay | Admitting: Physical Therapy

## 2010-08-02 ENCOUNTER — Ambulatory Visit: Payer: Self-pay | Admitting: Occupational Therapy

## 2010-08-02 ENCOUNTER — Ambulatory Visit: Payer: Self-pay

## 2010-08-02 NOTE — Assessment & Plan Note (Signed)
Summary: [name]   History of Present Illness: [name] is seen post hospital D/C  She was seen initially seen at [facility] for nausea and vohmiting with gastritis.  She has a left MCA stroke after endocscopy.  Thought secondary to uncontrolled HTN and DM.  Patient was on no meds prior to admission having run out of insulin.  I did her TEE as part of her neuro w/u and aside from severe LVH there were no cardiac abnormalities.  She has a relative tachycardia.  Describes palpitations lasting 30 minutes 2x/week.  No associated syncope SSCP dyspnea or diaphoresis  No history of PAF or arrthymia when in hospital.  Continues to do rehab at [facility]  Speech improved and mild LLE weakness  Current Problems (verified): 1)  Tachycardia  (ICD-785.0) 2)  Hypertension  (ICD-401.9) 3)  Hyperlipidemia  (ICD-272.4) 4)  Mrsa  (ICD-041.12) 5)  Sleep Apnea  (ICD-780.57) 6)  Cva  (ICD-434.91) 7)  Anemia  (ICD-285.9) 8)  Dvt  (ICD-453.40) 9)  Obesity  (ICD-278.00) 10)  Renal Failure  (ICD-586) 11)  Osteomyelitis  (ICD-730.20) 12)  Low Back Pain  (ICD-724.2) 13)  Neuropathy, Idiopathic Peripheral Nos  (ICD-356.9) 14)  Low Back Pain, Chronic  (ICD-724.2) 15)  Degenerative Joint Disease  (ICD-715.90) 16)  Headache  (ICD-784.0) 17)  Gerd  (ICD-530.81) 18)  Diabetes Mellitus, Type II  (ICD-250.00) 19)  Depression  (ICD-311) 20)  Anxiety  (ICD-300.00)  Current Medications (verified): 1)  Metoprolol Tartrate 50 Mg Tabs (Metoprolol Tartrate) .... Take One Tablet By Mouth Twice A Day 2)  Metformin Hcl 1000 Mg Tabs (Metformin Hcl) .... Two Times A Day 3)  Lisinopril 20 Mg Tabs (Lisinopril) .Marland Kitchen.. 1 Tab Morning 2 Tabs By Mouth At Bedtime 4)  Aspirin Ec 325 Mg Tbec (Aspirin) .... Take One Tablet By Mouth Daily 5)  Prilosec 20 Mg Cpdr (Omeprazole) .Marland Kitchen.. 1  Tab By Mouth Once Daily 6)  Ferrous Sulfate 325 (65 Fe) Mg  Tabs (Ferrous Sulfate) .Marland Kitchen.. 1 Tab By Mouth Two Times A Day 7)  Hydrochlorothiazide 12.5 Mg Tabs  (Hydrochlorothiazide) .... Take One Tablet By Mouth Daily. 8)  Potassium Chloride Crys Cr 20 Meq Cr-Tabs (Potassium Chloride Crys Cr) .Marland Kitchen.. 1  Tab By Mouth Once Daily 9)  Pravastatin Sodium 40 Mg Tabs (Pravastatin Sodium) .... Take One Tablet By Mouth Daily At Bedtime  Allergies (verified): No Known Drug Allergies  Past History:  Past Medical History: Last updated: 07/07/2010 HYPERTENSION  HYPERLIPIDEMIA MRSA hx of SLEEP APNEA CVA  ANEMIA  DVT OBESITY  RENAL FAILURE OSTEOMYELITIS LOW BACK PAIN  NEUROPATHY, IDIOPATHIC PERIPHERAL NOS LOW BACK PAIN, CHRONIC DEGENERATIVE JOINT DISEASE  HEADACHE  GERD DIABETES MELLITUS, TYPE II  DEPRESSION ANXIETY    Left parietal and bilateral cerebral infarcts.  Distal esophagitis Hypokalemia resolved.  Fluid overload, compensated.  urinary retention, sees Dr. Alinda Money History of renal failure due to vanc toxicity.  Pneumonia treated.     hospitalized 1-10 for MRSA osteomyelitis of left foot and bacteremia Low back pain, sees Dr. Naaman Plummer  Past Surgical History: Last updated: 07/07/2010 TYMPANOPLASTY  left 2nd toe amputation per Dr. Rolena Infante  History of an ear surgery    D and C in the  past.    Family History: Last updated: 07/07/2010  Positive for diabetes and coronary artery disease.   Social History: Last updated: 07/07/2010   The patient lives with sister in 1-level home with 3   steps at entry.  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  Has been   working on disability.  Does not use any tobacco, uses alcohol socially.   Sister at home can provide some supervision.      Review of Systems       Denies fever, malais, weight loss, blurry vision, decreased visual acuity, cough, sputum, SOB, hemoptysis, pleuritic pain,  heartburn, abdominal pain, melena, lower extremity edema, claudication, or rash.   Vital Signs:  Patient profile:   50 year old female Weight:      283 pounds Pulse rate:   108 / minute Resp:     14  per minute BP sitting:   132 / 82  (left arm)  Vitals Entered By: Burnett Kanaris (July 25, 2010 4:22 PM)  Physical Exam  General:  Affect appropriate Healthy:  appears stated age 50: normal Neck supple with no adenopathy JVP normal no bruits no thyromegaly Lungs clear with no wheezing and good diaphragmatic motion Heart:  S1/S2 no murmur,rub, gallop or click PMI normal Abdomen: benighn, BS positve, no tenderness, no AAA no bruit.  No HSM or HJR Distal pulses intact with no bruits No edema Neuro mild aphasia and LLE weakness Skin warm and dry    Impression & Recommendations:  Problem # 1:  TACHYCARDIA (ICD-785.0) Increase lopresser.  EF normal by TEE.  Event monitor to R/O arrythmia and specifically PAF Orders: Event (Event)  Problem # 2:  HYPERTENSION (ICD-401.9) Improved on medication Her updated medication list for this problem includes:    Metoprolol Tartrate 50 Mg Tabs (Metoprolol tartrate) .Marland Kitchen... Take one tablet by mouth twice a day    Lisinopril 20 Mg Tabs (Lisinopril) .Marland Kitchen... 1 tab morning 2 tabs by mouth at bedtime    Aspirin Ec 325 Mg Tbec (Aspirin) .Marland Kitchen... Take one tablet by mouth daily    Hydrochlorothiazide 12.5 Mg Tabs (Hydrochlorothiazide) .Marland Kitchen... Take one tablet by mouth daily.  Problem # 3:  HYPERLIPIDEMIA (B2193296.4) Continue statin labs per Dr Sharlene Motts Her updated medication list for this problem includes:    Pravastatin Sodium 40 Mg Tabs (Pravastatin sodium) .Marland Kitchen... Take one tablet by mouth daily at bedtime  CHOL: 189 (10/11/2006)   LDL: 123 (10/11/2006)   HDL: 37.4 (10/11/2006)   TG: 141 (10/11/2006)  Problem # 4:  CVA (ICD-434.91) Continue rehab.  F/U neuro  continue ASA and control of BP and DM unless monitor shows PAF antiplatelet Rx enough Her updated medication list for this problem includes:    Aspirin Ec 325 Mg Tbec (Aspirin) .Marland Kitchen... Take one tablet by mouth daily  Patient Instructions: 1)  Your physician recommends that you schedule a  follow-up appointment in: 6-8 weeks 2)  Your physician has recommended that you wear an event monitor.  Event monitors are medical devices that record the heart's electrical activity. Doctors most often use these monitors to diagnose arrhythmias. Arrhythmias are problems with the speed or rhythm of the heartbeat. The monitor is a small, portable device. You can wear one while you do your normal daily activities. This is usually used to diagnose what is causing palpitations/syncope (passing out).

## 2010-08-02 NOTE — Progress Notes (Signed)
  DDS Request received sent to [facility]  July 28, 2010 9:15 AM

## 2010-08-04 ENCOUNTER — Encounter: Payer: Self-pay | Admitting: Occupational Therapy

## 2010-08-08 ENCOUNTER — Ambulatory Visit: Payer: Self-pay | Admitting: Physical Therapy

## 2010-08-08 ENCOUNTER — Encounter: Payer: Self-pay | Admitting: Occupational Therapy

## 2010-08-09 ENCOUNTER — Encounter: Payer: Self-pay | Admitting: Speech Pathology

## 2010-08-09 ENCOUNTER — Ambulatory Visit: Payer: Self-pay | Admitting: Physical Therapy

## 2010-08-09 ENCOUNTER — Encounter: Payer: Self-pay | Admitting: Occupational Therapy

## 2010-08-10 ENCOUNTER — Ambulatory Visit: Payer: Self-pay

## 2010-08-10 ENCOUNTER — Ambulatory Visit: Payer: Self-pay | Admitting: Occupational Therapy

## 2010-08-10 ENCOUNTER — Ambulatory Visit: Payer: Self-pay | Attending: Physical Medicine & Rehabilitation | Admitting: Physical Therapy

## 2010-08-10 DIAGNOSIS — Z5189 Encounter for other specified aftercare: Secondary | ICD-10-CM | POA: Insufficient documentation

## 2010-08-10 DIAGNOSIS — I69919 Unspecified symptoms and signs involving cognitive functions following unspecified cerebrovascular disease: Secondary | ICD-10-CM | POA: Insufficient documentation

## 2010-08-10 DIAGNOSIS — M6281 Muscle weakness (generalized): Secondary | ICD-10-CM | POA: Insufficient documentation

## 2010-08-10 DIAGNOSIS — I69998 Other sequelae following unspecified cerebrovascular disease: Secondary | ICD-10-CM | POA: Insufficient documentation

## 2010-08-16 ENCOUNTER — Ambulatory Visit: Payer: Self-pay | Admitting: Speech Pathology

## 2010-08-16 ENCOUNTER — Ambulatory Visit: Payer: Self-pay | Attending: Family Medicine | Admitting: Physical Therapy

## 2010-08-16 ENCOUNTER — Ambulatory Visit: Payer: Self-pay | Admitting: Occupational Therapy

## 2010-08-18 ENCOUNTER — Telehealth (INDEPENDENT_AMBULATORY_CARE_PROVIDER_SITE_OTHER): Payer: Self-pay | Admitting: *Deleted

## 2010-08-21 ENCOUNTER — Ambulatory Visit: Payer: Self-pay | Admitting: Speech Pathology

## 2010-08-21 LAB — CARDIAC PANEL(CRET KIN+CKTOT+MB+TROPI)
CK, MB: 1.8 ng/mL (ref 0.3–4.0)
Relative Index: 1.5 (ref 0.0–2.5)
Relative Index: 1.7 (ref 0.0–2.5)
Total CK: 108 U/L (ref 7–177)
Total CK: 123 U/L (ref 7–177)
Troponin I: 0.04 ng/mL (ref 0.00–0.06)

## 2010-08-21 LAB — BASIC METABOLIC PANEL
BUN: 13 mg/dL (ref 6–23)
BUN: 20 mg/dL (ref 6–23)
CO2: 23 mEq/L (ref 19–32)
CO2: 23 mEq/L (ref 19–32)
Calcium: 8.2 mg/dL — ABNORMAL LOW (ref 8.4–10.5)
Chloride: 109 mEq/L (ref 96–112)
Creatinine, Ser: 1.24 mg/dL — ABNORMAL HIGH (ref 0.4–1.2)
GFR calc Af Amer: >60 mL/min (ref >60)
GFR calc non Af Amer: 40 mL/min — ABNORMAL LOW (ref >60)
Glucose, Bld: 179 mg/dL — ABNORMAL HIGH (ref 70–99)
Potassium: 4.5 mEq/L (ref 3.5–5.1)
Sodium: 139 mEq/L (ref 135–145)
Sodium: 140 mEq/L (ref 135–145)

## 2010-08-21 LAB — HEPATIC FUNCTION PANEL
AST: 14 U/L (ref 0–37)
Albumin: 2 g/dL — ABNORMAL LOW (ref 3.5–5.2)
Alkaline Phosphatase: 60 U/L (ref 39–117)
Total Protein: 6.1 g/dL (ref 6.0–8.3)

## 2010-08-21 LAB — URINE CULTURE: Colony Count: NO GROWTH

## 2010-08-21 LAB — LACTIC ACID, PLASMA: Lactic Acid, Venous: 2.5 mmol/L — ABNORMAL HIGH (ref 0.5–2.2)

## 2010-08-21 LAB — BLOOD GAS, ARTERIAL
Acid-base deficit: 1.3 mmol/L (ref 0.0–2.0)
O2 Saturation: 97.3 %
Patient temperature: 98.6
TCO2: 21 mmol/L (ref 0–100)
pH, Arterial: 7.405 — ABNORMAL HIGH (ref 7.350–7.400)

## 2010-08-21 LAB — URINALYSIS, ROUTINE W REFLEX MICROSCOPIC
Bilirubin Urine: NEGATIVE
Glucose, UA: >1000 mg/dL — AB
Ketones, ur: NEGATIVE mg/dL
Protein, ur: >300 mg/dL — AB
Urobilinogen, UA: 0.2 mg/dL (ref 0.0–1.0)

## 2010-08-21 LAB — COMPREHENSIVE METABOLIC PANEL
ALT: 14 U/L (ref 0–35)
Alkaline Phosphatase: 75 U/L (ref 39–117)
BUN: 25 mg/dL — ABNORMAL HIGH (ref 6–23)
CO2: 26 mEq/L (ref 19–32)
GFR calc non Af Amer: 49 mL/min — ABNORMAL LOW (ref >60)
Glucose, Bld: 409 mg/dL — ABNORMAL HIGH (ref 70–99)
Potassium: 3.8 mEq/L (ref 3.5–5.1)
Sodium: 134 mEq/L — ABNORMAL LOW (ref 135–145)

## 2010-08-21 LAB — CBC
HCT: 29.2 % — ABNORMAL LOW (ref 36.0–46.0)
HCT: 30.6 % — ABNORMAL LOW (ref 36.0–46.0)
HCT: 30.8 % — ABNORMAL LOW (ref 36.0–46.0)
Hemoglobin: 10.2 g/dL — ABNORMAL LOW (ref 12.0–15.0)
Hemoglobin: 10.7 g/dL — ABNORMAL LOW (ref 12.0–15.0)
Hemoglobin: 9.6 g/dL — ABNORMAL LOW (ref 12.0–15.0)
MCH: 28.5 pg (ref 26.0–34.0)
MCHC: 33.3 g/dL (ref 30.0–36.0)
MCV: 84.8 fL (ref 78.0–100.0)
MCV: 85.3 fL (ref 78.0–100.0)
RBC: 3.37 MIL/uL — ABNORMAL LOW (ref 3.87–5.11)
RBC: 3.75 MIL/uL — ABNORMAL LOW (ref 3.87–5.11)
RDW: 13 % (ref 11.5–15.5)
RDW: 13.1 % (ref 11.5–15.5)
WBC: 7.4 10*3/uL (ref 4.0–10.5)
WBC: 8.7 10*3/uL (ref 4.0–10.5)

## 2010-08-21 LAB — HEMOGLOBIN AND HEMATOCRIT, BLOOD
HCT: 28.9 % — ABNORMAL LOW (ref 36.0–46.0)
HCT: 29.7 % — ABNORMAL LOW (ref 36.0–46.0)
HCT: 29.9 % — ABNORMAL LOW (ref 36.0–46.0)
HCT: 31.7 % — ABNORMAL LOW (ref 36.0–46.0)
Hemoglobin: 10.3 g/dL — ABNORMAL LOW (ref 12.0–15.0)
Hemoglobin: 9.4 g/dL — ABNORMAL LOW (ref 12.0–15.0)
Hemoglobin: 9.9 g/dL — ABNORMAL LOW (ref 12.0–15.0)

## 2010-08-21 LAB — GLUCOSE, CAPILLARY
Glucose-Capillary: 133 mg/dL — ABNORMAL HIGH (ref 70–99)
Glucose-Capillary: 145 mg/dL — ABNORMAL HIGH (ref 70–99)
Glucose-Capillary: 171 mg/dL — ABNORMAL HIGH (ref 70–99)
Glucose-Capillary: 178 mg/dL — ABNORMAL HIGH (ref 70–99)
Glucose-Capillary: 182 mg/dL — ABNORMAL HIGH (ref 70–99)
Glucose-Capillary: 192 mg/dL — ABNORMAL HIGH (ref 70–99)
Glucose-Capillary: 194 mg/dL — ABNORMAL HIGH (ref 70–99)
Glucose-Capillary: 198 mg/dL — ABNORMAL HIGH (ref 70–99)
Glucose-Capillary: 216 mg/dL — ABNORMAL HIGH (ref 70–99)
Glucose-Capillary: 250 mg/dL — ABNORMAL HIGH (ref 70–99)
Glucose-Capillary: 420 mg/dL — ABNORMAL HIGH (ref 70–99)

## 2010-08-21 LAB — URINE MICROSCOPIC-ADD ON

## 2010-08-21 LAB — MRSA PCR SCREENING
MRSA by PCR: POSITIVE — AB
MRSA by PCR: POSITIVE — AB

## 2010-08-21 LAB — POCT I-STAT, CHEM 8
Calcium, Ion: 1.11 mmol/L — ABNORMAL LOW (ref 1.12–1.32)
Chloride: 103 mEq/L (ref 96–112)
HCT: 36 % (ref 36.0–46.0)
TCO2: 28 mmol/L (ref 0–100)

## 2010-08-21 LAB — GASTRIC OCCULT BLOOD (1-CARD TO LAB): Occult Blood, Gastric: POSITIVE — AB

## 2010-08-21 LAB — TSH: TSH: 3.821 u[IU]/mL (ref 0.350–4.500)

## 2010-08-21 LAB — LIPID PANEL
HDL: 39 mg/dL — ABNORMAL LOW (ref >39)
Total CHOL/HDL Ratio: 7.1 RATIO

## 2010-08-21 LAB — PROTIME-INR: Prothrombin Time: 14.7 seconds (ref 11.6–15.2)

## 2010-08-21 LAB — TYPE AND SCREEN: ABO/RH(D): A POS

## 2010-08-21 LAB — BLOOD GAS, VENOUS
Patient temperature: 98.6
pH, Ven: 7.327 — ABNORMAL HIGH (ref 7.250–7.300)

## 2010-08-21 LAB — DIFFERENTIAL
Eosinophils Relative: 1 % (ref 0–5)
Lymphocytes Relative: 31 % (ref 12–46)
Lymphs Abs: 2.7 10*3/uL (ref 0.7–4.0)
Monocytes Relative: 8 % (ref 3–12)

## 2010-08-21 LAB — ABO/RH: ABO/RH(D): A POS

## 2010-08-22 ENCOUNTER — Encounter: Payer: Self-pay | Admitting: Occupational Therapy

## 2010-08-22 NOTE — Progress Notes (Signed)
  Phone Note Other Incoming   Request: Send information Summary of Call: Request for records recieved from DDS. 4 pages forwarded to [employer].

## 2010-08-23 ENCOUNTER — Ambulatory Visit: Payer: Self-pay | Admitting: Physical Therapy

## 2010-08-23 ENCOUNTER — Ambulatory Visit: Payer: Self-pay | Admitting: Speech Pathology

## 2010-08-23 ENCOUNTER — Ambulatory Visit: Payer: Self-pay | Admitting: Occupational Therapy

## 2010-08-23 LAB — POCT CARDIAC MARKERS
CKMB, poc: 1.7 ng/mL (ref 1.0–8.0)
CKMB, poc: 1.9 ng/mL (ref 1.0–8.0)
Myoglobin, poc: 102 ng/mL (ref 12–200)
Myoglobin, poc: 113 ng/mL (ref 12–200)
Troponin i, poc: <0.05 ng/mL (ref 0.00–0.09)
Troponin i, poc: <0.05 ng/mL (ref 0.00–0.09)

## 2010-08-23 LAB — RAPID URINE DRUG SCREEN, HOSP PERFORMED
Barbiturates: NOT DETECTED
Benzodiazepines: NOT DETECTED
Cocaine: NOT DETECTED

## 2010-08-23 LAB — URINE MICROSCOPIC-ADD ON

## 2010-08-23 LAB — GLUCOSE, CAPILLARY
Glucose-Capillary: 237 mg/dL — ABNORMAL HIGH (ref 70–99)
Glucose-Capillary: 299 mg/dL — ABNORMAL HIGH (ref 70–99)
Glucose-Capillary: 334 mg/dL — ABNORMAL HIGH (ref 70–99)

## 2010-08-23 LAB — POCT I-STAT, CHEM 8
BUN: 12 mg/dL (ref 6–23)
Calcium, Ion: 1.14 mmol/L (ref 1.12–1.32)
Chloride: 99 meq/L (ref 96–112)
Creatinine, Ser: 1.1 mg/dL (ref 0.4–1.2)
Glucose, Bld: 536 mg/dL — ABNORMAL HIGH (ref 70–99)
HCT: 37 % (ref 36.0–46.0)
Hemoglobin: 12.6 g/dL (ref 12.0–15.0)
Potassium: 3.5 meq/L (ref 3.5–5.1)
Sodium: 134 meq/L — ABNORMAL LOW (ref 135–145)
TCO2: 26 mmol/L (ref 0–100)

## 2010-08-23 LAB — DIFFERENTIAL
Basophils Absolute: 0 10*3/uL (ref 0.0–0.1)
Eosinophils Relative: 1 % (ref 0–5)
Lymphocytes Relative: 30 % (ref 12–46)
Monocytes Absolute: 0.5 10*3/uL (ref 0.1–1.0)
Monocytes Relative: 7 % (ref 3–12)

## 2010-08-23 LAB — URINALYSIS, ROUTINE W REFLEX MICROSCOPIC
Bilirubin Urine: NEGATIVE
Protein, ur: 100 mg/dL — AB
Urobilinogen, UA: 0.2 mg/dL (ref 0.0–1.0)

## 2010-08-23 LAB — CBC
HCT: 34.5 % — ABNORMAL LOW (ref 36.0–46.0)
Hemoglobin: 11.8 g/dL — ABNORMAL LOW (ref 12.0–15.0)
MCV: 84.8 fL (ref 78.0–100.0)
RDW: 12.8 % (ref 11.5–15.5)
WBC: 7.4 10*3/uL (ref 4.0–10.5)

## 2010-08-23 LAB — POCT PREGNANCY, URINE: Preg Test, Ur: NEGATIVE

## 2010-08-25 ENCOUNTER — Encounter: Payer: Self-pay | Admitting: Occupational Therapy

## 2010-08-25 ENCOUNTER — Inpatient Hospital Stay: Payer: Self-pay | Admitting: Physical Medicine & Rehabilitation

## 2010-08-25 ENCOUNTER — Ambulatory Visit: Payer: Self-pay | Admitting: Physical Therapy

## 2010-08-28 ENCOUNTER — Encounter: Payer: Self-pay | Admitting: Speech Pathology

## 2010-08-28 LAB — URINALYSIS, ROUTINE W REFLEX MICROSCOPIC
Protein, ur: >300 mg/dL — AB
Urobilinogen, UA: 1 mg/dL (ref 0.0–1.0)

## 2010-08-28 LAB — DIFFERENTIAL
Eosinophils Relative: 2 % (ref 0–5)
Monocytes Absolute: 0.6 10*3/uL (ref 0.1–1.0)
Monocytes Relative: 7 % (ref 3–12)
Neutrophils Relative %: 63 % (ref 43–77)

## 2010-08-28 LAB — CBC
MCV: 88.3 fL (ref 78.0–100.0)
Platelets: 303 10*3/uL (ref 150–400)
RDW: 13.9 % (ref 11.5–15.5)
WBC: 8.5 10*3/uL (ref 4.0–10.5)

## 2010-08-28 LAB — POCT I-STAT, CHEM 8
BUN: 18 mg/dL (ref 6–23)
Calcium, Ion: 1.11 mmol/L — ABNORMAL LOW (ref 1.12–1.32)
Chloride: 103 mEq/L (ref 96–112)
Glucose, Bld: 418 mg/dL — ABNORMAL HIGH (ref 70–99)

## 2010-08-28 LAB — GLUCOSE, CAPILLARY: Glucose-Capillary: 274 mg/dL — ABNORMAL HIGH (ref 70–99)

## 2010-08-28 LAB — URINE CULTURE: Colony Count: >=100000

## 2010-08-28 LAB — URINE MICROSCOPIC-ADD ON

## 2010-08-30 ENCOUNTER — Encounter: Payer: Self-pay | Admitting: Occupational Therapy

## 2010-08-30 ENCOUNTER — Ambulatory Visit: Payer: Self-pay | Admitting: Physical Therapy

## 2010-08-30 ENCOUNTER — Encounter: Payer: Self-pay | Admitting: Cardiology

## 2010-08-30 ENCOUNTER — Ambulatory Visit: Payer: Self-pay | Admitting: Speech Pathology

## 2010-08-31 ENCOUNTER — Ambulatory Visit: Payer: Self-pay | Admitting: Physical Therapy

## 2010-09-04 ENCOUNTER — Ambulatory Visit: Payer: Self-pay | Admitting: Speech Pathology

## 2010-09-05 ENCOUNTER — Telehealth: Payer: Self-pay | Admitting: *Deleted

## 2010-09-05 NOTE — Telephone Encounter (Signed)
Spoke with pt, aware monitor shows sinus with occ PAC's. No sig arrythmia

## 2010-09-06 ENCOUNTER — Encounter: Payer: Self-pay | Admitting: Occupational Therapy

## 2010-09-06 ENCOUNTER — Ambulatory Visit: Payer: Self-pay | Admitting: Speech Pathology

## 2010-09-06 ENCOUNTER — Ambulatory Visit: Payer: Self-pay | Admitting: Physical Therapy

## 2010-09-07 ENCOUNTER — Ambulatory Visit: Payer: Self-pay | Admitting: Physical Therapy

## 2010-09-11 ENCOUNTER — Ambulatory Visit: Payer: Self-pay | Admitting: Physical Therapy

## 2010-09-11 ENCOUNTER — Telehealth: Payer: Self-pay | Admitting: Cardiovascular Disease

## 2010-09-11 ENCOUNTER — Ambulatory Visit (INDEPENDENT_AMBULATORY_CARE_PROVIDER_SITE_OTHER): Payer: Medicaid Other | Admitting: Cardiovascular Disease

## 2010-09-11 ENCOUNTER — Encounter: Payer: Self-pay | Admitting: *Deleted

## 2010-09-11 ENCOUNTER — Ambulatory Visit: Payer: Self-pay | Attending: Physical Medicine & Rehabilitation | Admitting: Speech Pathology

## 2010-09-11 ENCOUNTER — Encounter: Payer: Self-pay | Admitting: Cardiovascular Disease

## 2010-09-11 VITALS — BP 154/92 | HR 126 | Resp 14 | Ht 67.0 in | Wt 241.0 lb

## 2010-09-11 DIAGNOSIS — I10 Essential (primary) hypertension: Secondary | ICD-10-CM

## 2010-09-11 DIAGNOSIS — Z5189 Encounter for other specified aftercare: Secondary | ICD-10-CM | POA: Insufficient documentation

## 2010-09-11 DIAGNOSIS — I6350 Cerebral infarction due to unspecified occlusion or stenosis of unspecified cerebral artery: Secondary | ICD-10-CM

## 2010-09-11 DIAGNOSIS — D649 Anemia, unspecified: Secondary | ICD-10-CM

## 2010-09-11 DIAGNOSIS — E785 Hyperlipidemia, unspecified: Secondary | ICD-10-CM

## 2010-09-11 DIAGNOSIS — M6281 Muscle weakness (generalized): Secondary | ICD-10-CM | POA: Insufficient documentation

## 2010-09-11 DIAGNOSIS — I69919 Unspecified symptoms and signs involving cognitive functions following unspecified cerebrovascular disease: Secondary | ICD-10-CM | POA: Insufficient documentation

## 2010-09-11 DIAGNOSIS — R000 Tachycardia, unspecified: Secondary | ICD-10-CM

## 2010-09-11 DIAGNOSIS — I69998 Other sequelae following unspecified cerebrovascular disease: Secondary | ICD-10-CM | POA: Insufficient documentation

## 2010-09-11 MED ORDER — GABAPENTIN 600 MG PO TABS: 600.0000 mg | ORAL_TABLET | Freq: Three times a day (TID) | ORAL | Status: DC

## 2010-09-11 MED ORDER — AMITRIPTYLINE HCL 50 MG PO TABS: 50.0000 mg | ORAL_TABLET | ORAL | Status: DC | PRN

## 2010-09-11 MED ORDER — METOPROLOL TARTRATE 25 MG PO TABS: 25.0000 mg | ORAL_TABLET | Freq: Two times a day (BID) | ORAL | Status: DC

## 2010-09-11 NOTE — Assessment & Plan Note (Signed)
Continue statin  Labs per primary Lab Results  Component Value Date   [facility]  Value: 213        Total Cholesterol/HDL:CHD Risk Coronary Heart Disease Risk Table                     Men   Women  1/2 Average Risk   3.4   3.3  Average Risk       5.0   4.4  2 X Average Risk   9.6   7.1  3 X Average Risk  23.4   11.0        Use the calculated Patient Ratio above and the CHD Risk Table to determine the patient's CHD Risk.        ATP III CLASSIFICATION (LDL):  <100     mg/dL   Optimal  100-129  mg/dL   Near or Above                    Optimal  130-159  mg/dL   Borderline  160-189  mg/dL   High  >190     mg/dL   Very High* 06/13/2010

## 2010-09-11 NOTE — Assessment & Plan Note (Signed)
Resume beta blocker.  May need further lab work if still high with lopresser to include CBC,TSH

## 2010-09-11 NOTE — Assessment & Plan Note (Signed)
Should be improved if she is compliant with beta blocker

## 2010-09-11 NOTE — Patient Instructions (Signed)
CHANGE METOPROLOL TO 25MG  TWICE DAILY FOLLOW UP WITH DR Johnsie Cancel IN 4 WEEKS

## 2010-09-11 NOTE — Progress Notes (Signed)
[name] is seen post hospital D/C  She was seen initially seen at [facility] for nausea and vohmiting with gastritis.  She has a left MCA stroke after endocscopy.  Thought secondary to uncontrolled HTN and DM.  Patient was on no meds prior to admission having run out of insulin.  I did her TEE as part of her neuro w/u and aside from severe LVH there were no cardiac abnormalities.  She has a relative tachycardia.  Describes palpitations lasting 30 minutes 2x/week.  No associated syncope SSCP dyspnea or diaphoresis  No history of PAF or arrthymia when in hospital.  Continues to do rehab at [facility]  Speech improved and mild LLE weakness  Has not taken metoprolol 50 bid  Made her feel poorly.  Discussed need for beta blocker.  Will try lower dose.  Also having problems getting scripts form [facility] office.  Ok to continue rehab for CVA  ROS: Denies fever, malais, weight loss, blurry vision, decreased visual acuity, cough, sputum, SOB, hemoptysis, pleuritic pain, palpitaitons, heartburn, abdominal pain, melena, lower extremity edema, claudication, or rash.   General: Affect appropriate Healthy:  appears stated age 50: normal Neck supple with no adenopathy JVP normal no bruits no thyromegaly Lungs clear with no wheezing and good diaphragmatic motion Heart:  S1/S2 no murmur,rub, gallop or click PMI normal Abdomen: benighn, BS positve, no tenderness, no AAA no bruit.  No HSM or HJR Distal pulses intact with no bruits No edema Neuro non-focal Skin warm and dry No muscular weakness   Current Outpatient Prescriptions  Medication Sig Dispense Refill  . amitriptyline (ELAVIL) 50 MG tablet Take 50 mg by mouth as needed.        Marland Kitchen aspirin EC 325 MG EC tablet Take 325 mg by mouth daily.        . ferrous sulfate 325 (65 FE) MG tablet Take 325 mg by mouth 2 (two) times daily.        Marland Kitchen gabapentin (NEURONTIN) 600 MG tablet Take 600 mg by mouth 3 (three) times daily.        . hydrochlorothiazide  (,MICROZIDE/HYDRODIURIL,) 12.5 MG capsule Take 12.5 mg by mouth daily.        Marland Kitchen lisinopril (PRINIVIL,ZESTRIL) 20 MG tablet Take 1 tablet in the am and 2 at bedtime by mouth daily       . metFORMIN (GLUCOPHAGE) 1000 MG tablet Take 1,000 mg by mouth 2 (two) times daily with a meal.        . omeprazole (PRILOSEC) 20 MG capsule Take 20 mg by mouth daily.        . potassium chloride SA (K-DUR,KLOR-CON) 20 MEQ tablet Take 20 mEq by mouth daily.        . pravastatin (PRAVACHOL) 40 MG tablet Take 40 mg by mouth at bedtime.        . metoprolol (LOPRESSOR) 50 MG tablet Take 50 mg by mouth 2 (two) times daily.          Allergies  Review of patient's allergies indicates no known allergies.  Electrocardiogram:  Sinus tachycardia 127   P wave positive and normal in 2,3 and F  LAFB Poor R wave progresion  Assessment and Plan

## 2010-09-13 ENCOUNTER — Ambulatory Visit: Payer: Self-pay | Admitting: Speech Pathology

## 2010-09-13 ENCOUNTER — Ambulatory Visit: Payer: Self-pay | Admitting: Physical Therapy

## 2010-09-13 NOTE — Telephone Encounter (Signed)
Spoke with pt sister, she had an early morning physical therapy appt and her bp was elevated when she got there. They would not do therapy due to her bp. After she completed speech therapy her bp had gone down. She is having some nausea but has not eaten a lot before taking her meds. She feels fine now. They will cont to monitor her bp and let us know of cont problems.

## 2010-09-18 ENCOUNTER — Ambulatory Visit: Payer: Self-pay | Admitting: Speech Pathology

## 2010-09-20 ENCOUNTER — Ambulatory Visit: Payer: Self-pay | Admitting: Physical Therapy

## 2010-09-20 ENCOUNTER — Ambulatory Visit: Payer: Self-pay | Admitting: Speech Pathology

## 2010-09-20 NOTE — Progress Notes (Signed)
NAMEREID, GARNO NO.:  [id number]  MEDICAL RECORD NO.:  [id number]          PATIENT TYPE:  INP  LOCATION:  [id number]                         FACILITY:  [facility]  PHYSICIAN:  Kieth Brightly, MDDATE OF BIRTH:  [birth year]                                PROGRESS NOTE   HOSPITAL COURSE: [name] is a 50 year old female who was admitted on June 10, 2010 at [facility] for nausea and vomiting.  She was seen there.  At that time she was reported of feeling very sick and dizzy and frequently she was also had one episode of vomiting of red blood.  She was evaluated there and the initial admitting diagnosis was acute upper GI bleed possibly secondary to NSAIDs.  She had hypoglycemia at the time of admission and had an elevated blood pressure also.  After admission, she was transferred.  After admission, she was found to have difficulty in language issues and seen by Neurology as consult and the CAT scan revealed left parietal hypo-densities suspicious for ischemia infarction, so Neurology recommended transferring the patient to [facility] that she may need a transesophageal echocardiogram to rule out a cardioembolic source.  Started on aspirin after that.  For GI bleed issues resolved completely and she does not have any further issues. The patient has had a transesophageal echocardiogram on June 14, 2009 by Dr. Johnsie Cancel and it was found that there is no evidence of left atrial, left appendage clot, no left atrial clots and no left ventricular clots seen.  She had an MRI done which showed multiple foci of emboli on the brain in both hemispheres.  In view of this Neurology was almost definite that this is definitely an embolic source and so that is the reason that pushed [place] to pursue an transesophageal echocardiogram.  A bubble study done during PE showed no evidence of any right-to-left shunt either.  The patient was initiated on a dysphagia diet as per  speech and swallow evaluation, but after that the patient was found to be very short of breath suddenly 2 days back and found to have symptoms or signs.  She was found to have significant evidence of aspiration pneumonia, so in view of this I have made for again total n.p.o. and requested speech and swallow eval to do test based evaluation such as MBS or FEES to assess the accurately the swallowing issues and then initiate diet.  The patient is scheduled for FEES or MBS today.  Her respiratory status is much more stable.  A Neurology has signed off at this time and they recommended follow-up in the office of Dr. Leonie Man in 3-4 weeks.  At that time, they will do a outpatient TCD and bubble study.  They advised cardiac monitoring for rule out arrhythmias or paroxysmal atrial fibrillation.  Over the period of 4-5 days here, we have not been able to recognize any arrhythmias significant enough to induce embolic phenomenon such as atrial fibrillation so far.  I have already spoken to [facility] and she has been already set up for outpatient cardiac arrhythmia monitoring on a 4-week schedule, which will  be done as outpatient.  The patient has significant daytime somnolence and sleepy all day and night and considering her body habitus, I strongly suspect an obstructive sleep.  She will also need an outpatient sleep study once she is discharged.  SUBJECTIVE: GENERAL:  The patient seen and examined today, not in any distress. Afebrile.  Respiratory status is much better. HEAD AND NECK:  Thick neck.  No bleeding seen in oral or nasal mucosa. No JVD. CHEST:  Bilateral air entry is good anteriorly, but posteriorly on the right side there is markedly decreased air entry.  Coarse rales are heard posteriorly and anteriorly.  On the left side, also at the bases posteriorly, there are some coarse rales.  No wheezing heard. CARDIAC:  S1, S2 heard, regular. ABDOMEN:  Soft, nontender, obese.   Bowel sounds positive. EXTREMITIES:  No significant tenderness or erythema in the calf.  No trace pedal . CNS:  She was inspected by me during physical therapy and she is ambulating with some support.  The weakness on both sides have improved considerably and she has a power of 4-5/5 in all 4 limbs at this time.  Oropharyngeal dysphagia continues to be there and will be evaluated by Speech and Swallow.  Language is clear.  No slurring of speech is present at this time.  LABORATORY DATA: No new labs today.  The latest labs are CBC, WBC 9.5, hemoglobin 9.9, hematocrit 29.8, platelets 290.  TSH is 3.821.  ABG done on June 12, 2009 showed a pH of 7.405, pCO2 36.8, pO2 82.4 and O2 saturation 97.3, now on 2 liters oxygen.  Carotid Dopplers on June 12, 2009 showed bilateral minimal plaque noted.  No significant ICA stenosis, antegrade vertebral artery.  TE done on June 14, 2009 shows no left atrium appendage clot.  No PFO.  No normal valves and normal aortic orifice and 60% ejection fraction and LVH present.  MRA done on June 13, 2009 shows bilateral numerous small lacunar infarcts present possibly secondary to aortic embolic phenomenon versus hypotensive and watershed infarct.  MRA shows stenosis of thrombocyte, left MCA bifurcation affecting the posterior most left MCA sylvian division with decreased flow and likely more distal occlusal branch.  This is congruent with the MRI findings. No other major circle of Willis branch stenosis or occlusion.  ASSESSMENT: 1. Cerebrovascular accident with bilateral embolic strokes, strong     possibility for embolic phenomenon. 2. Morbid obesity. 3. Suspected obstructive sleep apnea. 4. Rule out any underlying arrhythmia suggest paroxysmal atrial     fibrillation -- planned for outpatient cardiac telemetry monitoring     for 4 weeks. 5. Dyslipidemia. 6. Diabetes mellitus. 7. Hypertension. 8. Back pain. 9.  Migraine. 10.Depression. 11.Upper gastrointestinal bleed at the time of admission, currently     stable hemoglobin. 12.Aspiration pneumonia.  PLAN: At this time, she is on aspirin 300 mg per rectal.  All her p.o. medications are on hold.  We had discontinued the Lantus and she is on NovoLog sensitive sliding scale at bedtime.  For aspiration pneumonia, continue the Zosyn.  She is on metoprolol 5 mg IV q. 6 hourly for the hypertension.  I need it to be restarted on Crestor 20 mg daily once her p.o. feeds has been established.  At that time, aspirin and Protonix can also be changed to p.o. mode.  Currently, she is on very low flow of normal saline and half normal saline 60 mL per hour for nutrition purposes.  Further decisions about p.o. feeding  will be done after the speech and swallow evaluation is done.  At the time of discharge, the patient will need; 1. Outpatient sleep study. 2. Outpatient telemetry monitoring with [facility] --     already has been set up. 3. Physical therapy, occupational therapy and possible speech language     therapy too.  DISPOSITION: Transferred to telemetry floor.  We will continue observation for any arrhythmic events until she is ready for discharge.     Kieth Brightly, MD     UT/MEDQ  D:  06/15/2010  T:  06/15/2010  Job:  [id number]  Electronically Signed by Kieth Brightly MD on 06/16/2010 02:04:40 PM

## 2010-09-21 NOTE — Consult Note (Signed)
  NAMEREDA, PIWOWARSKI NO.:  [id number]  MEDICAL RECORD NO.:  [id number]          PATIENT TYPE:  INP  LOCATION:  [place]                         FACILITY:  [facility]  PHYSICIAN:  Wallis Bamberg. Johnsie Cancel, MD, FACCDATE OF BIRTH:  January 25, 1961  DATE OF CONSULTATION: DATE OF DISCHARGE:                                CONSULTATION   Transesophageal Echo.  INDICATION:  CVA.  The patient was sedated with 6 mg Versed and 50 mcg of fentanyl.  Using digital technique, an Omniplane probe was advanced in the distal esophagus without incident.  Transgastric imaging revealed severe LVH with a septal thickness in the 18-20 mm range.  There was no outflow tract gradient or SAM.  EF was 60%.  There were no regional wall motion abnormalities and no mural thrombus.  Mitral valve was mildly thickened without significant MR. There was mild left atrial enlargement.  Right-sided cardiac chambers were normal.  Aortic valve was trileaflet and normal.  Ascending aortic root was normal.  Imaging of the atrial septum showed no PFO.  Bubble study was negative for right-to-left shunt.  There was no left atrial appendage clot.  Imaging of the aorta showed no significant debris.  FINAL IMPRESSION: 1. No source of embolus. 2. Negative bubble study. 3. No left atrial appendage clot. 4. Severe left ventricular hypertrophy, ejection fraction 60%. 5. Mild left atrial enlargement. 6. Normal cardiac valves. 7. Normal right-sided cardiac chambers. 8. No aortic debris.  The patient tolerated the procedure well.     Wallis Bamberg. Johnsie Cancel, MD, [facility]     PCN/MEDQ  D:  06/14/2010  T:  06/15/2010  Job:  [id number]  Electronically Signed by Jenkins Rouge MD [facility] on 06/20/2010 02:08:10 PM

## 2010-09-22 ENCOUNTER — Inpatient Hospital Stay: Payer: Self-pay | Admitting: Physical Medicine & Rehabilitation

## 2010-09-22 ENCOUNTER — Encounter: Payer: Self-pay | Attending: Physical Medicine & Rehabilitation

## 2010-09-22 DIAGNOSIS — Z79899 Other long term (current) drug therapy: Secondary | ICD-10-CM | POA: Insufficient documentation

## 2010-09-22 DIAGNOSIS — I69921 Dysphasia following unspecified cerebrovascular disease: Secondary | ICD-10-CM

## 2010-09-22 DIAGNOSIS — E1149 Type 2 diabetes mellitus with other diabetic neurological complication: Secondary | ICD-10-CM | POA: Insufficient documentation

## 2010-09-22 DIAGNOSIS — E1142 Type 2 diabetes mellitus with diabetic polyneuropathy: Secondary | ICD-10-CM | POA: Insufficient documentation

## 2010-09-22 DIAGNOSIS — I69998 Other sequelae following unspecified cerebrovascular disease: Secondary | ICD-10-CM | POA: Insufficient documentation

## 2010-09-22 DIAGNOSIS — I10 Essential (primary) hypertension: Secondary | ICD-10-CM | POA: Insufficient documentation

## 2010-09-22 DIAGNOSIS — M79609 Pain in unspecified limb: Secondary | ICD-10-CM | POA: Insufficient documentation

## 2010-09-22 DIAGNOSIS — I6340 Cerebral infarction due to embolism of unspecified cerebral artery: Secondary | ICD-10-CM

## 2010-09-22 DIAGNOSIS — I69920 Aphasia following unspecified cerebrovascular disease: Secondary | ICD-10-CM | POA: Insufficient documentation

## 2010-09-22 DIAGNOSIS — R471 Dysarthria and anarthria: Secondary | ICD-10-CM

## 2010-09-23 NOTE — Assessment & Plan Note (Signed)
REASON FOR VISIT:  Left lower extremity pain.  HISTORY:  A 50 year old female with prior history of hypertension, diabetes type 2 admitted June 10, 2010, with nausea, vomiting, hematemesis.  She progressed with some problems with speech and mental status changes.  Head CT demonstrated infarct, left parietal lobe.  MRI showed confluent left parietal and temporal lobe infarct, additional small Lipitor infarcts, bilateral cerebral hemispheric embolic infarcts. She had bubble study on TEE June 14, 2010.  PAST MEDICAL HISTORY:  Significant for low back pain, spondylolisthesis as well as diabetes type 2 with peripheral neuropathy.  She had aphasia as well as left knee pain during her rehab stay.  She was then followed at [facility] for her primary care, although the patient somehow does not seem satisfied, wants to see a Reg another doctor.  Nevertheless, [facility] has prescribed her medications, although she did not call them when she ran out of lisinopril and has been off for a week and during this time has been discharged for therapy because her blood pressure has been too high.  I reviewed her e-chart as well as paper chart.  The patient has followed up with Dr. Johnsie Cancel as instructed. Holter monitor recommended.  I do not see where she followed up after this as recommended yet, although this might be coming up soon.  She has had a mammogram, showed no evidence malignancy, July 19, 2010.  Pain is 8/10 on average.  It increases with walking, bending, sitting, standing.  Walking tolerance is 2 minutes.  She needs some help with meal prep, household duties, shopping.  She has bowel and bladder control issues, weakness, numbness, trouble walking, spasms, dizziness, confusion, depression, anxiety, poor appetite, coughing.  MEDICATIONS:  Ferrous sulfate, amitriptyline 50 mg, aspirin 325 mg, gabapentin 600 t.i.d., hydrochlorothiazide 12.5 daily, lisinopril 20 b.i.d., has not been  taking metformin 1000 b.i.d., omeprazole 20 mg a day, K-Dur 20 mEq per day, and pravastatin 40 one-half b.i.d.  SOCIAL HISTORY:  Divorced, lives with sister.  Sister is with her today.  PHYSICAL EXAMINATION:  VITAL SIGNS:  Blood pressure is 137/85, pulse 99, respirations 18, and O2 sat 98% room air. GENERAL:  No acute stress.  Mood and affect appropriate.  Speech is dysarthric and aphasic. NEUROLOGIC:  Standing is fair.  She has pain in the left lower extremity, even with light touch around the knee.  She has hyperactive reflex bilateral lower extremities.  IMPRESSION: 1. CVA, bilateral cerebral, worse with left parietal which is causing     aphasia.  I do think her left lower extremity pain is likely stroke     related given that she has hypersensitivity over that area.  No     specific thalamic infarcts were noted on the report.     Unfortunately, the actual study is not available on image cast. 2. I will increase her Neurontin to 600 q.i.d.  I will send her back     to physical therapy.  I have written for lisinopril and encouraged     her to follow up with [facility] until she gets a primary care     physician.  If not much better in 1 month, consider MRI lumbar spine.    Charlett Blake, M.D. Electronically Signed   AEK/MedQ D:  09/22/2010 12:16:43  T:  09/23/2010 02:25:42  Job #:  [id number]

## 2010-09-25 ENCOUNTER — Ambulatory Visit: Payer: Self-pay

## 2010-09-25 LAB — BASIC METABOLIC PANEL
BUN: 9 mg/dL (ref 6–23)
BUN: 11 mg/dL (ref 6–23)
BUN: 15 mg/dL (ref 6–23)
BUN: 16 mg/dL (ref 6–23)
CO2: 24 mEq/L (ref 19–32)
CO2: 27 mEq/L (ref 19–32)
CO2: 27 mEq/L (ref 19–32)
Calcium: 8.1 mg/dL — ABNORMAL LOW (ref 8.4–10.5)
Calcium: 8.8 mg/dL (ref 8.4–10.5)
Calcium: 8.5 mg/dL (ref 8.4–10.5)
Calcium: 8.6 mg/dL (ref 8.4–10.5)
Calcium: 8.8 mg/dL (ref 8.4–10.5)
Chloride: 99 mEq/L (ref 96–112)
Chloride: 106 mEq/L (ref 96–112)
Chloride: 98 mEq/L (ref 96–112)
Creatinine, Ser: 0.5 mg/dL (ref 0.4–1.2)
Creatinine, Ser: 0.65 mg/dL (ref 0.4–1.2)
Creatinine, Ser: 1.65 mg/dL — ABNORMAL HIGH (ref 0.4–1.2)
Creatinine, Ser: 1.66 mg/dL — ABNORMAL HIGH (ref 0.4–1.2)
Creatinine, Ser: 1.96 mg/dL — ABNORMAL HIGH (ref 0.4–1.2)
GFR calc Af Amer: >60 mL/min (ref >60)
GFR calc Af Amer: >60 mL/min (ref >60)
GFR calc Af Amer: 39 mL/min — ABNORMAL LOW (ref >60)
GFR calc Af Amer: >60 mL/min (ref >60)
GFR calc non Af Amer: >60 mL/min (ref >60)
GFR calc non Af Amer: >60 mL/min (ref >60)
GFR calc non Af Amer: 28 mL/min — ABNORMAL LOW (ref >60)
GFR calc non Af Amer: 33 mL/min — ABNORMAL LOW (ref >60)
GFR calc non Af Amer: 33 mL/min — ABNORMAL LOW (ref >60)
GFR calc non Af Amer: 34 mL/min — ABNORMAL LOW (ref >60)
GFR calc non Af Amer: 35 mL/min — ABNORMAL LOW (ref >60)
GFR calc non Af Amer: >60 mL/min (ref >60)
GFR calc non Af Amer: >60 mL/min (ref >60)
Glucose, Bld: 301 mg/dL — ABNORMAL HIGH (ref 70–99)
Glucose, Bld: 114 mg/dL — ABNORMAL HIGH (ref 70–99)
Glucose, Bld: 142 mg/dL — ABNORMAL HIGH (ref 70–99)
Glucose, Bld: 145 mg/dL — ABNORMAL HIGH (ref 70–99)
Glucose, Bld: 147 mg/dL — ABNORMAL HIGH (ref 70–99)
Glucose, Bld: 233 mg/dL — ABNORMAL HIGH (ref 70–99)
Potassium: 4 mEq/L (ref 3.5–5.1)
Potassium: 3.5 mEq/L (ref 3.5–5.1)
Potassium: 3.7 mEq/L (ref 3.5–5.1)
Potassium: 3.9 mEq/L (ref 3.5–5.1)
Sodium: 133 mEq/L — ABNORMAL LOW (ref 135–145)
Sodium: 135 mEq/L (ref 135–145)
Sodium: 134 mEq/L — ABNORMAL LOW (ref 135–145)
Sodium: 138 mEq/L (ref 135–145)
Sodium: 139 mEq/L (ref 135–145)
Sodium: 141 mEq/L (ref 135–145)

## 2010-09-25 LAB — URINE MICROSCOPIC-ADD ON

## 2010-09-25 LAB — HEPATIC FUNCTION PANEL
Alkaline Phosphatase: 84 U/L (ref 39–117)
Bilirubin, Direct: 0.4 mg/dL — ABNORMAL HIGH (ref 0.0–0.3)
Indirect Bilirubin: 1.3 mg/dL — ABNORMAL HIGH (ref 0.3–0.9)
Total Bilirubin: 1.7 mg/dL — ABNORMAL HIGH (ref 0.3–1.2)
Total Protein: 7.8 g/dL (ref 6.0–8.3)

## 2010-09-25 LAB — CBC
HCT: 38.6 % (ref 36.0–46.0)
HCT: 30 % — ABNORMAL LOW (ref 36.0–46.0)
HCT: 30.5 % — ABNORMAL LOW (ref 36.0–46.0)
HCT: 30.6 % — ABNORMAL LOW (ref 36.0–46.0)
HCT: 30.7 % — ABNORMAL LOW (ref 36.0–46.0)
HCT: 33.5 % — ABNORMAL LOW (ref 36.0–46.0)
HCT: 35.1 % — ABNORMAL LOW (ref 36.0–46.0)
Hemoglobin: 12 g/dL (ref 12.0–15.0)
Hemoglobin: 12.6 g/dL (ref 12.0–15.0)
Hemoglobin: 10.1 g/dL — ABNORMAL LOW (ref 12.0–15.0)
Hemoglobin: 10.2 g/dL — ABNORMAL LOW (ref 12.0–15.0)
Hemoglobin: 10.2 g/dL — ABNORMAL LOW (ref 12.0–15.0)
Hemoglobin: 10.3 g/dL — ABNORMAL LOW (ref 12.0–15.0)
Hemoglobin: 10.4 g/dL — ABNORMAL LOW (ref 12.0–15.0)
Hemoglobin: 11 g/dL — ABNORMAL LOW (ref 12.0–15.0)
Hemoglobin: 11.5 g/dL — ABNORMAL LOW (ref 12.0–15.0)
Hemoglobin: 9.7 g/dL — ABNORMAL LOW (ref 12.0–15.0)
Hemoglobin: 9.9 g/dL — ABNORMAL LOW (ref 12.0–15.0)
MCHC: 33 g/dL (ref 30.0–36.0)
MCHC: 32.6 g/dL (ref 30.0–36.0)
MCHC: 33.6 g/dL (ref 30.0–36.0)
MCV: 88.2 fL (ref 78.0–100.0)
MCV: 89.4 fL (ref 78.0–100.0)
MCV: 89.8 fL (ref 78.0–100.0)
MCV: 90.3 fL (ref 78.0–100.0)
MCV: 90.9 fL (ref 78.0–100.0)
Platelets: 295 10*3/uL (ref 150–400)
Platelets: 302 10*3/uL (ref 150–400)
Platelets: 306 10*3/uL (ref 150–400)
Platelets: 358 10*3/uL (ref 150–400)
Platelets: 391 10*3/uL (ref 150–400)
Platelets: 426 10*3/uL — ABNORMAL HIGH (ref 150–400)
Platelets: 453 10*3/uL — ABNORMAL HIGH (ref 150–400)
Platelets: 479 10*3/uL — ABNORMAL HIGH (ref 150–400)
RBC: 4.01 MIL/uL (ref 3.87–5.11)
RBC: 4.21 MIL/uL (ref 3.87–5.11)
RBC: 3.23 MIL/uL — ABNORMAL LOW (ref 3.87–5.11)
RBC: 3.42 MIL/uL — ABNORMAL LOW (ref 3.87–5.11)
RBC: 3.46 MIL/uL — ABNORMAL LOW (ref 3.87–5.11)
RBC: 3.89 MIL/uL (ref 3.87–5.11)
RDW: 13 % (ref 11.5–15.5)
RDW: 13.4 % (ref 11.5–15.5)
RDW: 13.5 % (ref 11.5–15.5)
WBC: 13.8 10*3/uL — ABNORMAL HIGH (ref 4.0–10.5)
WBC: 9.9 10*3/uL (ref 4.0–10.5)
WBC: 10.1 10*3/uL (ref 4.0–10.5)
WBC: 11 10*3/uL — ABNORMAL HIGH (ref 4.0–10.5)
WBC: 6.3 10*3/uL (ref 4.0–10.5)
WBC: 6.6 10*3/uL (ref 4.0–10.5)
WBC: 7.7 10*3/uL (ref 4.0–10.5)
WBC: 7.8 10*3/uL (ref 4.0–10.5)
WBC: 7.8 10*3/uL (ref 4.0–10.5)
WBC: 9.6 10*3/uL (ref 4.0–10.5)
WBC: 9.6 10*3/uL (ref 4.0–10.5)

## 2010-09-25 LAB — GLUCOSE, CAPILLARY
Glucose-Capillary: 146 mg/dL — ABNORMAL HIGH (ref 70–99)
Glucose-Capillary: 222 mg/dL — ABNORMAL HIGH (ref 70–99)
Glucose-Capillary: 278 mg/dL — ABNORMAL HIGH (ref 70–99)
Glucose-Capillary: 285 mg/dL — ABNORMAL HIGH (ref 70–99)
Glucose-Capillary: 87 mg/dL (ref 70–99)
Glucose-Capillary: 122 mg/dL — ABNORMAL HIGH (ref 70–99)
Glucose-Capillary: 123 mg/dL — ABNORMAL HIGH (ref 70–99)
Glucose-Capillary: 123 mg/dL — ABNORMAL HIGH (ref 70–99)
Glucose-Capillary: 128 mg/dL — ABNORMAL HIGH (ref 70–99)
Glucose-Capillary: 130 mg/dL — ABNORMAL HIGH (ref 70–99)
Glucose-Capillary: 132 mg/dL — ABNORMAL HIGH (ref 70–99)
Glucose-Capillary: 133 mg/dL — ABNORMAL HIGH (ref 70–99)
Glucose-Capillary: 139 mg/dL — ABNORMAL HIGH (ref 70–99)
Glucose-Capillary: 140 mg/dL — ABNORMAL HIGH (ref 70–99)
Glucose-Capillary: 143 mg/dL — ABNORMAL HIGH (ref 70–99)
Glucose-Capillary: 143 mg/dL — ABNORMAL HIGH (ref 70–99)
Glucose-Capillary: 147 mg/dL — ABNORMAL HIGH (ref 70–99)
Glucose-Capillary: 158 mg/dL — ABNORMAL HIGH (ref 70–99)
Glucose-Capillary: 159 mg/dL — ABNORMAL HIGH (ref 70–99)
Glucose-Capillary: 160 mg/dL — ABNORMAL HIGH (ref 70–99)
Glucose-Capillary: 163 mg/dL — ABNORMAL HIGH (ref 70–99)
Glucose-Capillary: 163 mg/dL — ABNORMAL HIGH (ref 70–99)
Glucose-Capillary: 168 mg/dL — ABNORMAL HIGH (ref 70–99)
Glucose-Capillary: 173 mg/dL — ABNORMAL HIGH (ref 70–99)
Glucose-Capillary: 180 mg/dL — ABNORMAL HIGH (ref 70–99)
Glucose-Capillary: 194 mg/dL — ABNORMAL HIGH (ref 70–99)
Glucose-Capillary: 209 mg/dL — ABNORMAL HIGH (ref 70–99)
Glucose-Capillary: 211 mg/dL — ABNORMAL HIGH (ref 70–99)
Glucose-Capillary: 216 mg/dL — ABNORMAL HIGH (ref 70–99)
Glucose-Capillary: 220 mg/dL — ABNORMAL HIGH (ref 70–99)
Glucose-Capillary: 241 mg/dL — ABNORMAL HIGH (ref 70–99)

## 2010-09-25 LAB — CULTURE, BLOOD (ROUTINE X 2): Culture: NO GROWTH

## 2010-09-25 LAB — DIFFERENTIAL
Basophils Absolute: 0 10*3/uL (ref 0.0–0.1)
Basophils Absolute: 0 10*3/uL (ref 0.0–0.1)
Basophils Relative: 0 % (ref 0–1)
Basophils Relative: 0 % (ref 0–1)
Basophils Relative: 0 % (ref 0–1)
Basophils Relative: 1 % (ref 0–1)
Eosinophils Absolute: 0 10*3/uL (ref 0.0–0.7)
Eosinophils Absolute: 0.1 10*3/uL (ref 0.0–0.7)
Eosinophils Absolute: 0.2 10*3/uL (ref 0.0–0.7)
Eosinophils Relative: 0 % (ref 0–5)
Lymphocytes Relative: 11 % — ABNORMAL LOW (ref 12–46)
Lymphs Abs: 1.3 10*3/uL (ref 0.7–4.0)
Monocytes Absolute: 0.7 10*3/uL (ref 0.1–1.0)
Monocytes Absolute: 1.6 10*3/uL — ABNORMAL HIGH (ref 0.1–1.0)
Monocytes Absolute: 0.7 10*3/uL (ref 0.1–1.0)
Monocytes Relative: 12 % (ref 3–12)
Monocytes Relative: 10 % (ref 3–12)
Neutro Abs: 10.6 10*3/uL — ABNORMAL HIGH (ref 1.7–7.7)
Neutro Abs: 3.8 10*3/uL (ref 1.7–7.7)
Neutrophils Relative %: 77 % (ref 43–77)
Neutrophils Relative %: 60 % (ref 43–77)
Neutrophils Relative %: 72 % (ref 43–77)
Neutrophils Relative %: 74 % (ref 43–77)

## 2010-09-25 LAB — MICROALBUMIN / CREATININE URINE RATIO: Creatinine, Urine: 119.7 mg/dL

## 2010-09-25 LAB — LIPID PANEL
Cholesterol: 208 mg/dL — ABNORMAL HIGH (ref 0–200)
HDL: 43 mg/dL (ref >39)
LDL Cholesterol: 151 mg/dL — ABNORMAL HIGH (ref 0–99)
Total CHOL/HDL Ratio: 4.8 RATIO
Triglycerides: 72 mg/dL (ref <150)

## 2010-09-25 LAB — COMPREHENSIVE METABOLIC PANEL
ALT: 16 U/L (ref 0–35)
ALT: 30 U/L (ref 0–35)
ALT: 31 U/L (ref 0–35)
ALT: 33 U/L (ref 0–35)
AST: 15 U/L (ref 0–37)
Albumin: 1.8 g/dL — ABNORMAL LOW (ref 3.5–5.2)
Alkaline Phosphatase: 66 U/L (ref 39–117)
Alkaline Phosphatase: 102 U/L (ref 39–117)
Alkaline Phosphatase: 106 U/L (ref 39–117)
Alkaline Phosphatase: 131 U/L — ABNORMAL HIGH (ref 39–117)
Alkaline Phosphatase: 65 U/L (ref 39–117)
Alkaline Phosphatase: 88 U/L (ref 39–117)
BUN: 14 mg/dL (ref 6–23)
BUN: 16 mg/dL (ref 6–23)
BUN: 16 mg/dL (ref 6–23)
CO2: 27 mEq/L (ref 19–32)
CO2: 26 mEq/L (ref 19–32)
CO2: 27 mEq/L (ref 19–32)
CO2: 27 mEq/L (ref 19–32)
CO2: 27 mEq/L (ref 19–32)
CO2: 28 mEq/L (ref 19–32)
Calcium: 8.3 mg/dL — ABNORMAL LOW (ref 8.4–10.5)
Chloride: 101 mEq/L (ref 96–112)
Chloride: 102 mEq/L (ref 96–112)
Chloride: 102 mEq/L (ref 96–112)
Chloride: 107 mEq/L (ref 96–112)
Creatinine, Ser: 1.74 mg/dL — ABNORMAL HIGH (ref 0.4–1.2)
GFR calc Af Amer: >60 mL/min (ref >60)
GFR calc non Af Amer: >60 mL/min (ref >60)
GFR calc non Af Amer: 24 mL/min — ABNORMAL LOW (ref >60)
GFR calc non Af Amer: 24 mL/min — ABNORMAL LOW (ref >60)
GFR calc non Af Amer: 31 mL/min — ABNORMAL LOW (ref >60)
GFR calc non Af Amer: 32 mL/min — ABNORMAL LOW (ref >60)
Glucose, Bld: 296 mg/dL — ABNORMAL HIGH (ref 70–99)
Glucose, Bld: 126 mg/dL — ABNORMAL HIGH (ref 70–99)
Glucose, Bld: 151 mg/dL — ABNORMAL HIGH (ref 70–99)
Glucose, Bld: 165 mg/dL — ABNORMAL HIGH (ref 70–99)
Glucose, Bld: 174 mg/dL — ABNORMAL HIGH (ref 70–99)
Potassium: 3.5 mEq/L (ref 3.5–5.1)
Potassium: 3.4 mEq/L — ABNORMAL LOW (ref 3.5–5.1)
Potassium: 4 mEq/L (ref 3.5–5.1)
Potassium: 4 mEq/L (ref 3.5–5.1)
Potassium: 4 mEq/L (ref 3.5–5.1)
Potassium: 4.1 mEq/L (ref 3.5–5.1)
Sodium: 131 mEq/L — ABNORMAL LOW (ref 135–145)
Sodium: 135 mEq/L (ref 135–145)
Sodium: 138 mEq/L (ref 135–145)
Sodium: 139 mEq/L (ref 135–145)
Total Bilirubin: 0.5 mg/dL (ref 0.3–1.2)
Total Bilirubin: 0.7 mg/dL (ref 0.3–1.2)
Total Bilirubin: 0.7 mg/dL (ref 0.3–1.2)
Total Bilirubin: 1.3 mg/dL — ABNORMAL HIGH (ref 0.3–1.2)
Total Bilirubin: 1.4 mg/dL — ABNORMAL HIGH (ref 0.3–1.2)
Total Protein: 6.3 g/dL (ref 6.0–8.3)
Total Protein: 6.2 g/dL (ref 6.0–8.3)
Total Protein: 6.2 g/dL (ref 6.0–8.3)

## 2010-09-25 LAB — CULTURE, ROUTINE-ABSCESS

## 2010-09-25 LAB — URINALYSIS, ROUTINE W REFLEX MICROSCOPIC
Ketones, ur: NEGATIVE mg/dL
Leukocytes, UA: NEGATIVE
Nitrite: NEGATIVE
Nitrite: NEGATIVE
Protein, ur: 100 mg/dL — AB
Protein, ur: NEGATIVE mg/dL
Urobilinogen, UA: 1 mg/dL (ref 0.0–1.0)
pH: 6.5 (ref 5.0–8.0)

## 2010-09-25 LAB — ANAEROBIC CULTURE

## 2010-09-25 LAB — CK: Total CK: 28 U/L (ref 7–177)

## 2010-09-25 LAB — VANCOMYCIN, RANDOM: Vancomycin Rm: 36.2 ug/mL

## 2010-09-25 LAB — RAPID URINE DRUG SCREEN, HOSP PERFORMED
Barbiturates: NOT DETECTED
Cocaine: NOT DETECTED
Opiates: POSITIVE — AB
Tetrahydrocannabinol: NOT DETECTED

## 2010-09-25 LAB — MAGNESIUM: Magnesium: 2.6 mg/dL — ABNORMAL HIGH (ref 1.5–2.5)

## 2010-09-25 LAB — VANCOMYCIN, TROUGH: Vancomycin Tr: 13.8 ug/mL (ref 10.0–20.0)

## 2010-09-25 LAB — APTT: aPTT: 44 seconds — ABNORMAL HIGH (ref 24–37)

## 2010-09-25 LAB — HEMOGLOBIN A1C: Hgb A1c MFr Bld: 13.4 % — ABNORMAL HIGH (ref 4.6–6.1)

## 2010-09-26 LAB — URINALYSIS, ROUTINE W REFLEX MICROSCOPIC
Glucose, UA: NEGATIVE mg/dL
Ketones, ur: NEGATIVE mg/dL
Protein, ur: NEGATIVE mg/dL
Urobilinogen, UA: 0.2 mg/dL (ref 0.0–1.0)

## 2010-09-26 LAB — GLUCOSE, CAPILLARY
Glucose-Capillary: 109 mg/dL — ABNORMAL HIGH (ref 70–99)
Glucose-Capillary: 128 mg/dL — ABNORMAL HIGH (ref 70–99)
Glucose-Capillary: 137 mg/dL — ABNORMAL HIGH (ref 70–99)
Glucose-Capillary: 146 mg/dL — ABNORMAL HIGH (ref 70–99)
Glucose-Capillary: 147 mg/dL — ABNORMAL HIGH (ref 70–99)
Glucose-Capillary: 147 mg/dL — ABNORMAL HIGH (ref 70–99)
Glucose-Capillary: 148 mg/dL — ABNORMAL HIGH (ref 70–99)
Glucose-Capillary: 149 mg/dL — ABNORMAL HIGH (ref 70–99)
Glucose-Capillary: 162 mg/dL — ABNORMAL HIGH (ref 70–99)
Glucose-Capillary: 167 mg/dL — ABNORMAL HIGH (ref 70–99)
Glucose-Capillary: 169 mg/dL — ABNORMAL HIGH (ref 70–99)
Glucose-Capillary: 176 mg/dL — ABNORMAL HIGH (ref 70–99)
Glucose-Capillary: 194 mg/dL — ABNORMAL HIGH (ref 70–99)
Glucose-Capillary: 209 mg/dL — ABNORMAL HIGH (ref 70–99)
Glucose-Capillary: 211 mg/dL — ABNORMAL HIGH (ref 70–99)
Glucose-Capillary: 213 mg/dL — ABNORMAL HIGH (ref 70–99)
Glucose-Capillary: 229 mg/dL — ABNORMAL HIGH (ref 70–99)

## 2010-09-26 LAB — URINE CULTURE
Colony Count: NO GROWTH
Culture: NO GROWTH

## 2010-09-26 LAB — BASIC METABOLIC PANEL
CO2: 27 mEq/L (ref 19–32)
Calcium: 8.5 mg/dL (ref 8.4–10.5)
Chloride: 102 mEq/L (ref 96–112)
Creatinine, Ser: 1.37 mg/dL — ABNORMAL HIGH (ref 0.4–1.2)
Glucose, Bld: 195 mg/dL — ABNORMAL HIGH (ref 70–99)

## 2010-09-26 LAB — CBC
HCT: 29.4 % — ABNORMAL LOW (ref 36.0–46.0)
Hemoglobin: 9.8 g/dL — ABNORMAL LOW (ref 12.0–15.0)
MCHC: 33.3 g/dL (ref 30.0–36.0)
MCV: 87.6 fL (ref 78.0–100.0)
RBC: 3.36 MIL/uL — ABNORMAL LOW (ref 3.87–5.11)

## 2010-09-26 LAB — CARDIAC PANEL(CRET KIN+CKTOT+MB+TROPI)
CK, MB: 1.8 ng/mL (ref 0.3–4.0)
CK, MB: 1.9 ng/mL (ref 0.3–4.0)
Total CK: 196 U/L — ABNORMAL HIGH (ref 7–177)
Total CK: 204 U/L — ABNORMAL HIGH (ref 7–177)
Troponin I: <0.01 ng/mL (ref 0.00–0.06)

## 2010-09-26 LAB — D-DIMER, QUANTITATIVE: D-Dimer, Quant: 3.48 ug/mL-FEU — ABNORMAL HIGH (ref 0.00–0.48)

## 2010-09-26 LAB — URINE MICROSCOPIC-ADD ON

## 2010-09-27 ENCOUNTER — Encounter: Payer: Self-pay | Admitting: Speech Pathology

## 2010-09-27 ENCOUNTER — Ambulatory Visit: Payer: Self-pay | Admitting: Physical Therapy

## 2010-10-02 ENCOUNTER — Ambulatory Visit: Payer: Self-pay | Admitting: Speech Pathology

## 2010-10-04 ENCOUNTER — Ambulatory Visit: Payer: Self-pay | Admitting: Speech Pathology

## 2010-10-04 ENCOUNTER — Ambulatory Visit: Payer: Self-pay | Admitting: Physical Therapy

## 2010-10-10 ENCOUNTER — Ambulatory Visit (INDEPENDENT_AMBULATORY_CARE_PROVIDER_SITE_OTHER): Payer: Medicaid Other | Admitting: Cardiovascular Disease

## 2010-10-10 ENCOUNTER — Encounter: Payer: Self-pay | Admitting: Cardiovascular Disease

## 2010-10-10 DIAGNOSIS — I10 Essential (primary) hypertension: Secondary | ICD-10-CM

## 2010-10-10 DIAGNOSIS — R000 Tachycardia, unspecified: Secondary | ICD-10-CM

## 2010-10-10 DIAGNOSIS — E785 Hyperlipidemia, unspecified: Secondary | ICD-10-CM

## 2010-10-10 NOTE — Assessment & Plan Note (Signed)
Nonpathologic.  Normal EF.  No arrhythmia on event monitor.  Increase lopressor to 50 bid.  F/U primary for lab work

## 2010-10-10 NOTE — Patient Instructions (Addendum)
Your physician recommends that you schedule a follow-up appointment in: [place] physician has recommended you make the following change in your medication: METOPROLOL 50 MG 1 TAB TWICE DAILY.

## 2010-10-10 NOTE — Progress Notes (Signed)
50 yo obese, HTN female.  CVA last year.  TEE with normal LV severe LVH. Previous issues with med compliance.  Relative tachycardia.  Improved with lopresser.  Event monitor 2/12 with SR occasional PAC;s no significant arrhtymia.  CRF HTN and elevated lipids well controlled.  DM less well controlled.  Daughter helps her with meds.  No SSCP, palpitations, dyspnea or edema.    Reviewed Event monitor and strips from 2/20-3/20  ROS: Denies fever, malais, weight loss, blurry vision, decreased visual acuity, cough, sputum, SOB, hemoptysis, pleuritic pain, palpitaitons, heartburn, abdominal pain, melena, lower extremity edema, claudication, or rash.   General: Affect appropriate Healthy:  appears stated age 10: normal Neck supple with no adenopathy JVP normal no bruits no thyromegaly Lungs clear with no wheezing and good diaphragmatic motion Heart:  S1/S2 no murmur,rub, gallop or click PMI normal Abdomen: benighn, BS positve, no tenderness, no AAA no bruit.  No HSM or HJR Distal pulses intact with no bruits No edema Neuro non-focal Skin warm and dry No muscular weakness   Current Outpatient Prescriptions  Medication Sig Dispense Refill  . amitriptyline (ELAVIL) 50 MG tablet Take 1 tablet (50 mg total) by mouth as needed.  30 tablet  12  . ferrous sulfate 325 (65 FE) MG tablet Take 325 mg by mouth 2 (two) times daily.        Marland Kitchen gabapentin (NEURONTIN) 600 MG tablet Take 600 mg by mouth. 4 times per day       . hydrochlorothiazide (,MICROZIDE/HYDRODIURIL,) 12.5 MG capsule Take 12.5 mg by mouth daily.        Marland Kitchen lisinopril (PRINIVIL,ZESTRIL) 20 MG tablet 1 tab po bid      . metFORMIN (GLUCOPHAGE) 1000 MG tablet Take 1,000 mg by mouth 2 (two) times daily with a meal.        . metoprolol (LOPRESSOR) 50 MG tablet Take 50 mg by mouth 2 (two) times daily.        . pravastatin (PRAVACHOL) 40 MG tablet Take 40 mg by mouth at bedtime.        Marland Kitchen DISCONTD: aspirin EC 325 MG EC tablet Take 325 mg by  mouth daily.        Marland Kitchen DISCONTD: gabapentin (NEURONTIN) 600 MG tablet Take 1 tablet (600 mg total) by mouth 3 (three) times daily.  90 tablet  3  . DISCONTD: metoprolol (LOPRESSOR) 25 MG tablet Take 1 tablet (25 mg total) by mouth 2 (two) times daily.  60 tablet  11  . DISCONTD: omeprazole (PRILOSEC) 20 MG capsule Take 20 mg by mouth daily.        Marland Kitchen DISCONTD: potassium chloride SA (K-DUR,KLOR-CON) 20 MEQ tablet Take 20 mEq by mouth daily.          Allergies  Vancomycin  Electrocardiogram:  SR 96 Nonspecific ST/T wave changes  Assessment and Plan

## 2010-10-10 NOTE — Assessment & Plan Note (Signed)
Well controlled.  Continue current medications and low sodium Dash type diet.  .  Will improve more with increase in BB

## 2010-10-10 NOTE — Assessment & Plan Note (Addendum)
Just started to be compliant with statin.  F/U labs with primary 3 months

## 2010-10-11 ENCOUNTER — Ambulatory Visit: Payer: Self-pay | Admitting: Speech Pathology

## 2010-10-11 ENCOUNTER — Ambulatory Visit: Payer: Self-pay | Attending: Physical Therapy | Admitting: Physical Therapy

## 2010-10-11 DIAGNOSIS — I69919 Unspecified symptoms and signs involving cognitive functions following unspecified cerebrovascular disease: Secondary | ICD-10-CM | POA: Insufficient documentation

## 2010-10-11 DIAGNOSIS — M6281 Muscle weakness (generalized): Secondary | ICD-10-CM | POA: Insufficient documentation

## 2010-10-11 DIAGNOSIS — I69998 Other sequelae following unspecified cerebrovascular disease: Secondary | ICD-10-CM | POA: Insufficient documentation

## 2010-10-11 DIAGNOSIS — Z5189 Encounter for other specified aftercare: Secondary | ICD-10-CM | POA: Insufficient documentation

## 2010-10-13 ENCOUNTER — Ambulatory Visit: Payer: Self-pay | Admitting: Physical Therapy

## 2010-10-13 ENCOUNTER — Ambulatory Visit: Payer: Self-pay

## 2010-10-16 ENCOUNTER — Ambulatory Visit: Payer: Self-pay | Admitting: *Deleted

## 2010-10-16 ENCOUNTER — Ambulatory Visit: Payer: Self-pay | Admitting: Speech Pathology

## 2010-10-16 ENCOUNTER — Encounter: Payer: Self-pay | Admitting: Speech Pathology

## 2010-10-20 ENCOUNTER — Ambulatory Visit: Payer: Self-pay

## 2010-10-20 ENCOUNTER — Ambulatory Visit: Payer: Self-pay | Admitting: *Deleted

## 2010-10-23 ENCOUNTER — Ambulatory Visit: Payer: Self-pay | Admitting: Physical Therapy

## 2010-10-23 ENCOUNTER — Ambulatory Visit: Payer: Self-pay | Admitting: Speech Pathology

## 2010-10-24 ENCOUNTER — Encounter: Payer: Self-pay | Attending: Neurosurgery | Admitting: Neurosurgery

## 2010-10-24 NOTE — Consult Note (Signed)
NAMESARIANA, PAYSON NO.:  [id number]   MEDICAL RECORD NO.:  [id number]          PATIENT TYPE:  INP   LOCATION:  5013                         FACILITY:  [facility]   PHYSICIAN:  Dahlia Bailiff, MD    DATE OF BIRTH:  [birth year]   DATE OF CONSULTATION:  DATE OF DISCHARGE:                                 CONSULTATION   REQUESTING PHYSICIAN:  Stephanie Coup, M.D.   REASON FOR CONSULTATION:  Osteomyelitis of the left second toe.   HISTORY:  [name] is a very pleasant 50 year old morbidly obese African  American woman who has had a long-standing history of poorly controlled  diabetes.  She presented initially yesterday to the emergency room with  complaints of severe back pain.  She states that she was having  difficulty getting in and out of bed because of her pain so much so that  she could not make it to the bathroom on time and she had an episode of  incontinence.  Upon initial evaluation, she was admitted to the Medical  Service.  She was treated appropriately with narcotic pain medications  which seemed to reduce her back pain significantly.  She had an MRI done  on admission which demonstrated degenerative disk disease at L4-5 with a  grade 1 anterolisthesis at L4-L5 and degenerative changes.  The patient  was also noted to have a swollen nontender left second toe.  Because of  the history of diabetes and concern for infection, an x-ray was done.  The x-ray showed bony destruction consistent with osteomyelitis of the  distal phalanx.  As a result, orthopedic consultation was requested  today.   The patient was seen today in the hospital by my PA, Karen Kays,  and myself.  The patient stated that about a month ago, she received new  diabetic shoes and she felt as though they were too tight and she  started developing a blister on her second toe.  She was given some  cream for this, and despite this, the blister persisted.  She had noted  at home histories of  fevers and chills.  Currently, she is in the  hospital.  She is not on any antibiotics and she has not had any ordered  as of yet.  The patient has been having fevers up to 102.9.   Currently, she is comfortable in bed.   PAST MEDICAL, SURGICAL, FAMILY, AND SOCIAL HISTORY:  Type 2 diabetes,  chronic neuropathy, depression, reflux disease, chronic back pain, and  migraines.  She is on Glucotrol, St. Johns wart remover, and gabapentin.  She is a nonsmoker and nondrinker.   CLINICAL EXAMINATION:  GENERAL:  She is a pleasant woman appears her  stated age in no acute distress.  She is alert and oriented x3.  VITAL SIGNS:  She has a fever to 102.9, pulse 121, and blood pressure  165/90.  NEUROLOGIC:  Cranial nerves II-XII are tested, they are grossly intact.  LUNGS:  Clear to auscultation, but she does have a cough and there are  positive palpitations.  ABDOMEN:  Soft, nontender, and nondistended.  No rebound tenderness.  EXTREMITIES:  She has full range of motion in the lower extremities.  They are palpable with diminished pulses at the dorsalis pedis and  posterior tibialis bilaterally.  It is difficult to get a capillary  refill because she has significant thickening of the nail beds.  There  is significant swelling of the second left toe with a draining wound  present at the distal end.   Her initial laboratory work and x-rays were reviewed.  They do show bony  destruction consistent with osteomyelitis.   Her initial white count on evaluation in the ER was 9.0; however,  yesterday's white count was 16.5.  Her initial white count in the  emergency room per the ER report done on June 23, 2008, was 9.9.  Hematocrit was 38.6.  most recent hematocrit was 37.9.   Blood cultures are still pending at this time.   At this point in time, the patient's clinical history is consistent with  diabetic osteomyelitis of the second toe of the left foot.  Due to the  fact that the patient's white  count is increased over the last 2 days  from 9-16.5 and she is having fevers and reports a history of chills, I  am concerned that she has early sepsis from the infection.  Given this,  I would recommend an urgent amputation of the second metatarsal.  I have  reviewed this with the patient and I have spoken with her sister on the  phone.  The risks of the surgery as I have explained to the patient  include ongoing infection, need for further surgery, wound healing  complications, death, stroke, and paralysis.  All of this was discussed.  All of her questions were encouraged.  At this point in time, I have  spoken with the primary care team and they are aware of my plan.  Since  she ate at 2 p.m. today, we will take her tonight at around 8 o'clock to  the OR for an amputation of the second toe, an I&D, and washout.  After  that, we will begin her on broad-spectrum antibiotics (vancomycin) and  then we will modify the antibiotic regimen per the culture results.       Dahlia Bailiff, MD  Electronically Signed     DDB/MEDQ  D:  06/24/2008  T:  06/25/2008  Job:  [id number]   cc:   Stephanie Coup, MD

## 2010-10-24 NOTE — Discharge Summary (Signed)
NAMELO, PAAP NO.:  [id number]   MEDICAL RECORD NO.:  [id number]          PATIENT TYPE:  INP   LOCATION:  [id number]                         FACILITY:  [facility]   PHYSICIAN:  Lucy Chris, MD     DATE OF BIRTH:  22-Jun-1960   DATE OF ADMISSION:  06/23/2008  DATE OF DISCHARGE:  07/06/2008                               DISCHARGE SUMMARY   DISCHARGE DIAGNOSES:  1. Low back pain secondary to musculoskeletal etiology, resolving.  2. Osteomyelitis of the left second toe status post amputation.  3. Group B streptococcal bacteremia.  4. Acute renal failure, resolving.  5. Diabetes mellitus type 2, complicated by peripheral neuropathy.  6. Depression/anxiety.  7. Gastroesophageal reflux disease.  8. History of migraine headaches.   DISCHARGE MEDICATIONS:  1. Ciprofloxacin 400 mg IV q.12 h for 4 weeks total.  2. Daptomycin 750 mg IV daily for 4 weeks total.  3. Flagyl 500 mg IV q.8 h for 4 weeks total.  4. Elavil 75 mg p.o. at bedtime.  5. Norvasc 5 mg p.o. daily.  6. Colace 100 mg p.o. b.i.d.  7. Ensure 1 can p.o. t.i.d.  8. Lantus 15 units subcutaneous at bedtime.  9. Sliding scale insulin.  10.MS Contin 15 mg p.o. b.i.d.  11.Senokot 1 tablet p.o. at bedtime.  12.Tylenol 650 mg q.6 h p.r.n.  13.Morphine 2 mg IV q.4 h p.r.n.  14.Flexeril 5 mg p.o. t.i.d.  15.MiraLax 17 g p.o. daily p.r.n.  16.Ambien 5 mg p.o. at bedtime p.r.n.  17.Ativan 2 mg p.o. q.8 h p.r.n.   DISPOSITION AND FOLLOWUP:  Once the patient's insurance was approved,  she was discharged from the Medicine Teaching Service and transferred to  Inpatient Rehab for further continuation of her medical care.  The  patient was transferred in stable condition, and she will need to remain  on IV antibiotics noted in the discharge medication section above for a  total of 4 weeks starting from June 24, 2008.  Because she is on  daptomycin, she will need to have her creatinine kinase checked once per  week as well as a weekly BMET to assure that her hypokalemia and acute  renal failure continue to improve.  Furthermore, her insulin regimen  should be titrated based on her blood glucose levels and consideration  should be given to starting her on an ACE inhibitor once her acute renal  issues are stabilized.  If the patient continues to have episodes of  hypertension, consideration can also be given to increasing her Norvasc  dose from 5 mg to 10 mg daily.  However, it is important to note that  she did have transient hypotension earlier in her hospitalization (which  is a possible contributing etiology for her acute renal failure).  So,  it is important not to decrease blood pressures too much in the acute  setting.  Upon discharge from inpatient rehab, the patient should also  be set up with a followup appointment by her orthopedic surgeon, Dr.  Rolena Infante to evaluate her left foot after her recent surgery.   PROCEDURES PERFORMED:  1.  An MRI of the lumbar spine with and without contrast showed a grade      1 anterior slip of L4 on L5 with mild spinal stenosis.  There was      no spinal infection or diskitis.  There was no acute fracture      noted.  A chest x-ray performed on June 23, 2008, showed mild      cardiac enlargement.  Low lung volumes with vascular crowding and      atelectasis were also noted.  A plain film of the hip on right side      performed on June 23, 2008, showed early degenerative changes      but no acute fractures, subluxation, or dislocation.  Soft tissues      were intact.  A plain film of the left foot performed on June 24, 2008, showed bony destructive changes of the second toe      consistent with osteomyelitis.  There is also a potential      incomplete nondisplaced fracture of the proximal phalanx of the      fourth toe.  An MRI of the left foot with and without contrast      performed on June 27, 2008, did not reveal any further signs of       persistent osteomyelitis.  Please see MRI report for further      details.  2. Renal ultrasound performed on June 29, 2008, revealed no focal      lesions.  No bladder lesions were noted.  There was no      hydronephrosis noted, although the kidneys appeared slightly larger      than average.  A portable chest x-ray performed on July 02, 2008, revealed appropriate placement of the patient's PICC line.  3. A second toe amputation of the left foot was performed on June 24, 2008, with clear margins.  This was performed by Dr. Melina Schools of [facility].  4. A PICC line was placed on July 02, 2008.  PICC lines had 3      lumens.   CONSULTATIONS:  Dr. Rolena Infante from Orthopedic Surgery.   BRIEF ADMITTING HISTORY AND PHYSICAL:  Ms. Filo is a 50 year old  African American female with past medical history of uncontrolled  diabetes complicated by peripheral neuropathy, migraine headaches, and  depression who was admitted to the emergency department complaining of  low back pain which is worse on the right side compared to the left.  She stated that it started the morning of admission when she was in bed  trying to roll over.  Her pain is described as sharp and resides mainly  in the right lumbar area and right thigh/buttocks area.  The patient  states that she could not move at all without significant pain and had  to sit down on her bed when she tried to mobilize.  The patient denies  any fecal or urinary incontinence and denies any focal numbness or  weakness.  The patient states that she had injured her back in early  2008 and has seen occupational therapy since then on a regular basis.  This was secondary to a job injury that she incurred.  She states that  her chronic low back pain had been progressively getting better until 4-  5 days prior to admission when she strained her back again throwing away  trash.  She denied any fevers, chills, chest pain,  shortness of breath,  cough, dysuria, or hematuria.  She states that she had some sick  contacts at work, however.   PHYSICAL EXAMINATION:  VITAL SIGNS:  The patient had a temperature of  98.4, blood pressure of 138/99, pulse of 115, respiratory rate of 20,  and oxygen saturation of 100% on room air.  GENERAL:  The patient was in moderate-to-severe distress secondary to  her pain and was tearful because of this.  EYES:  Extraocular motions intact with pupils equal, round, and reactive  to light.  ENT:  Oropharynx and oral cavity to be clear with moist mucous  membranes.  NECK:  Supple with no meningeal signs.  RESPIRATORY:  Clear to auscultation bilaterally.  CARDIOVASCULAR:  Tachycardia with regular rate and no murmurs, rubs, or  gallops.  GI:  Positive bowel sounds with a soft, nontender, and nondistended  abdomen.  EXTREMITIES:  No clubbing, cyanosis, or edema.  MUSCULOSKELETAL:  Significant decreased range of motion in the lower  back secondary to pain, a positive straight leg test on the right with  decreased range of motion.  NEUROLOGIC:  The patient to be alert and oriented x3.  Cranial nerves II  through XII were intact.  Sensation was intact in all extremities, and  the patient had decreased but symmetric deep tendon reflexes throughout.  There was no clonus, no saddle anesthesia, and the patient had a  negative Babinski's bilaterally.  RECTAL:  Tone was not assessed.   White blood cell count was 9.9 with a hemoglobin of 13.1, platelets of  295, ANC of 7.9, and MCV of 88.  Sodium 133, potassium 4.0, chloride  199, bicarb 24, BUN 9, creatinine 0.5, and glucose 301.  Total bilirubin  1.6, alk phos 66, AST 15, ALT 16, total protein 6.3, and albumin 2.6.  Calcium 8.3.  Urinalysis showed greater than 1000 glucose and 100  protein, but no signs of infection.  Urine pregnancy test was negative.  Urine drug screen was positive for opiates.  Microalbumin to creatinine  ratio was  328.  Hemoglobin A1c was 13.4.  HIV was nonreactive.  Magnesium was 1.9.  Fasting lipid profile showed a total cholesterol of  206, triglycerides 72, HDL 43, and LDL 151.   HOSPITAL COURSE:  1. Low-back pain.  The patient was initially admitted for pain      control, and an MRI showed a grade 1 slip of L4 on L5, but no      evidence of abscess or fracture.  The patient had a hip x-ray      performed to rule out any kind of acute fracture of the right      pelvic girdle and this was found to be negative for any soft tissue      process or bony abnormalities.  Physical therapy was consulted and      the patient continued to work with them throughout her      hospitalization showing slow but progressive improvement in her      mobility.  Pain was initially controlled with MS Contin and IV      morphine and throughout her hospitalization her as needed morphine      requirement slowly decreased.  Because the patient was still having      significant mobility issues and required assistance to ambulate out      of bed and into chair, it was decided that the best course of  action for further recovery would be transfer to inpatient rehab      which the patient was agreeable to.  The patient was also continued      on Flexeril for her low back pain and she will need to continue      working with physical therapy to regain her functionality.  Because      her low back pain was thought to be secondary to musculoskeletal      etiology, the patient may benefit from a short course of NSAID use      once her renal issues have stabilized.  2. Osteomyelitis of the left foot.  On hospital day #2, the patient      was noticed to have some purulent discharge from her left second      toe.  An x-ray was obtained which showed bony destructive changes      consistent with osteomyelitis.  An ankle-brachial index was also      obtained which was uninterpretable because of a poor study and       uncompressible vessels.  Because of her acute osteomyelitis, an      urgent Orthopedic consult was obtained, and the patient underwent      an amputation of the left second toe on June 24, 2008, by Dr.      Rolena Infante.  The patient's toe was amputated with clear margins and      culture results from her wound showed MRSA sensitive to Bactrim and      vancomycin as well as group B Streptococcus.  Since the patient was      initially started on IV vancomycin and Zosyn on hospital day #2      because of fever and a rising white blood cell count, she was      continued on this antibiotic regimen while her culture results were      pending.  However, because she developed acute renal failure partly      due to vancomycin toxicity during the middle of her hospital      course, the patient was switched over to IV ciprofloxacin, Flagyl,      and daptomycin for polymicrobial coverage.  A repeat MRI was      performed as well because of persistent leukocytosis and fever      which was concerning for recurrent osteomyelitis.  However, there      was none noted and the patient continued with her current      antibiotic regimen throughout the remainder of her hospital course      with eventual resolution of her leukocytosis and fevers.  The      patient will need to continue with polymicrobial antibiotic      coverage for a total of 4 weeks starting from June 24, 2008.  3. Group B Streptococcus bacteremia.  During her initial ED visit, the      patient was noted to spike a fever up to 101.4 degrees.  The      patient did not have a leukocytosis at that time and urinalysis and      chest x-ray were negative for any signs of infection.  Therefore, a      blood culture was obtained and the patient was initially held off      on starting antibiotics as there was no acute source of infection      found during her admission.  However, on her hospital day #2, the  patient continued to spike fevers and  developed leukocytosis with a      white blood cell count of 16.5.  Broad-spectrum antibiotics were      started with IV vancomycin and Zosyn and by the next day, blood      cultures results returned with group B Streptococcus.  This was      most likely seeded from her left toe wound.  The patient was      continued on IV antibiotics throughout her hospital course and      repeat blood cultures 3 days after initiation of antibiotics showed      no growth by day of discharge.  However, since the patient will      require a total of 4 weeks of IV antibiotics, a PICC line was      placed prior to discharge once her creatinine level started to      stabilize.  In addition because of her bacteremia, a      transesophageal echocardiogram was obtained to rule out any      vegetation or endocarditis.  By day of discharge, remained afebrile      for greater than 48 hours and her leukocytosis had resolved.  She      will need to have careful monitoring of her creatinine kinase level      while she is on the daptomycin at least once per week.  4. Acute renal failure, resolving.  On day of admission, the patient      had a creatinine of 0.5 which rose to a high of 2.21 during the      first half of her hospitalization.  This was thought secondary to a      combination of acute tubular necrosis secondary to a transient      period of hypotension and vancomycin toxicity.  The patient      received an ultrasound to rule out postobstructive causes and this      was negative and a urine for eosinophils was also negative.  The      patient was transitioned from the vancomycin and Zosyn to      ciprofloxacin, Flagyl, and daptomycin.  IV hydration with normal      saline was started and the patient's renal function began to      plateau and then creatinine levels started to decrease slowly and      progressively to a low of 1.6 on day of discharge.  The patient      maintained good urine output throughout  this time and should be      continued on IV fluids while in the Inpatient Rehab Unit with      periodic checks of her creatinine to assure continued resolution of      her acute renal failure.  5. Diabetes mellitus type 2, complicated by neuropathy.  Initially,      the patient was held from her home Glucotrol and started on Lantus      with a sliding scale insulin.  A hemoglobin A1c was checked and     found to be 13.4 indicating poor home glycemic control.  The      patient's Lantus was titrated up for her CBGs and Glucotrol was      reinstated after her surgery.  A microalbumin to creatinine ratio      was obtained and found to be elevated at 328 and the patient would  benefit from addition of ACE inhibitor to her medication regimen      once her acute renal failure resolves.  Because of her peripheral      neuropathy, the patient was initially continued on a high-dose      Neurontin.  However, due to the development of her acute renal      failure, her Neurontin was discontinued and the patient was started      on Elavil to help both with her depression as well as her      neuropathic pain.  The patient may benefit from addition of Lyrica      to her medication regimen upon transfer to inpatient rehab if she      continues to have peripheral neuropathy.  In addition, a fasting      lipid profile was obtained and showed an LDL level of 151 which is      not at goal for a diabetic.  Once her acute issues are resolved,      the patient may benefit from the addition of a statin therapy as      well.  During the last half of her hospital stay, the patient was      also noticed to be hypertensive in the 160 range.  Therefore, she      was started on a low-dose calcium channel blocker with good effect      on her blood pressure.  Should her blood pressures remained      elevated, consideration can be given to increasing her Norvasc dose      but careful attention was be made to not  acutely drop her blood      pressures too much to avoid any further episodes of transient      hypotension which may lead to worsening of her renal function.   DISCHARGE LABORATORIES AND VITAL SIGNS:  On day of discharge, the  patient had a temperature of 97.6, blood pressure of 138/93, pulse of  99, respiratory rate of 18, and oxygen saturation of 95% on room air.  White blood cell count was 6.6, hemoglobin 10.2, and platelets 4-79.  Sodium 141, potassium 3.7, chloride 105, bicarb 28, BUN 13, creatinine  of 1.61, glucose 147, and calcium 8.5.  Creatinine kinase level was 28.  PT was 15.8, INR 1.2, and PTT 44.      Stephanie Coup, MD  Electronically Signed      Lucy Chris, MD  Electronically Signed   RK/MEDQ  D:  07/07/2008  T:  07/08/2008  Job:  [id number]

## 2010-10-24 NOTE — Consult Note (Signed)
[name], JOHANNES NO.:  [id number]   MEDICAL RECORD NO.:  [id number]          PATIENT TYPE:  INP   LOCATION:  [id number]                         FACILITY:  [facility]   PHYSICIAN:  Raynelle Bring, MD      DATE OF BIRTH:  Jun 27, 1960   DATE OF CONSULTATION:  07/14/2008  DATE OF DISCHARGE:  07/06/2008                                 CONSULTATION   REQUESTING PHYSICIAN:  Thornton Dales, P.A.  Dr. Jarvis Morgan   REASON FOR CONSULTATION:  Urinary retention.   HISTORY OF PRESENT ILLNESS:  [name] is a 50 year old female admitted  on June 23, 2008 for severe back pain, lower extremity pain, fever,  and leukocytosis.  She was found to have osteomyelitis of her left leg  and toe, which was treated with antibiotics and amputation.  During this  admission, her MRI shows a grade I anterolisthesis at L4-L5 with  multilevel degenerative disk disease from L3-S1.  Urology was consulted  today for a 3-day history of urinary retention.  The indwelling Foley  catheter was removed on July 12, 2008.  Her PVRs have been gradually  declining since that time.  Today, the PVRs ranged from 200-400 and her  voids have been 100-200 mL.  She denies any history of lower urinary  tract symptoms except for urge incontinence, which began with her back  pain.   PAST MEDICAL HISTORY:  1. Morbid obesity.  2. Type 2 diabetes mellitus with neuropathy.  3. Chronic back pain.  4. Depression.  5. Migraine headache.  6. MRSA infection.  7. Questionable CVA.   PAST SURGICAL HISTORY:  Amputation of left second toe.   MEDICATIONS:  1. Gabapentin.  2. Strifon Forte.  3. Glucotrol.  4. Colace.  5. Daptomycin.  6. Cipro.  7. Flagyl.  8. Elavil.  9. Norvasc.  10.Lantus.  11.MS Contin.  12.Flexeril.  13.MiraLax.  14.Ambien.  15.Ativan.  16.Lopressor.   ALLERGIES:  LATEX, causes rash.   FAMILY HISTORY:  Positive for coronary artery disease and MI in her  father.  Mother died at 51  years old with breast cancer.   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  She lives alone, but her son  and his wife live nearby.  She denies any alcohol use.  She also denies  any history or current use of tobacco.   REVIEW OF SYSTEMS:  Positive for fever and night sweats.  She also has  recent history of skin rash from latex use.  She complains of occasional  blurred vision and headaches due to some migraines.  She has chronic  back pain and deals with constipation due to pain medication use.   PHYSICAL EXAMINATION:  VITAL SIGNS:  Temperature is 97.7, pulse 93,  respirations 20, and blood pressure 123/73.  GENERAL:  She is a well-developed, well-nourished, obese, African  American female in no acute distress.  HEENT:  Normocephalic, atraumatic.  NECK:  Supple.  No lymphadenopathy.  HEART:  Regular rate and rhythm.  No murmurs, rubs, or  gallops.  LUNGS:  Bilateral breath sounds.  Clear to auscultation.  ABDOMEN:  Soft, nontender, nondistended.  Positive bowel sounds x4.  No  CVA tenderness.  GU:  Has a normal urinary meatus without lesions, mass, or discharge.  EXTREMITIES:  Positive pedal pulses x2 bilaterally, positive lower ankle  edema, 1+ pitting and no atrophy.  NEUROLOGIC:  Grossly intact.   LABORATORY DATA:  Urine culture at this time shows no growth.  Microscopic urine, wbc's 0 to few, rbc's 3-6, bacteria rare.  Urinalysis, SP is 1.012, pH 7, blood small, leukocyte trace, rest is  negative.   WBC 5.0, hemoglobin 9.8, hematocrit 29.4, and platelet count of 403.   Sodium 136, chloride 102, BUN 14, creatinine 1.37, which has decreased  from 1.61 on July 06, 2008, on admission was 0.50.  Her potassium is  3.6 and her CO2 is 27.   A renal ultrasound performed on June 29, 2008, showed right kidney in  the size of 15.5 cm and left kidney at 15.0 cm.  Renal ultrasound showed  no evidence of  hydronephrosis, masses, calculus, or parenchymal loss.  There were no bladder lesions.   IMPRESSION/PLAN:  Urinary retention was observed by elevated PVRs of 200-  400 mL.  Recommended continuing to check PVRs and perform in and out  caths for high levels of PVRs.  If her PVR continues to be > 300cc, I  would recommend that she be discharged with either CIC or an indwelling  catheter.  Despite a discussion regarding the advantages of CIC, the  patient is reluctant to proceed with CIC at this time and prefers and  indwelling catheter if necessary.  She may have a neurologic etiology  for her bladder dysfunction although her current need for narcotic pain  medication and poorly controlled diabetes certainly may also be  contributing to her urinary retention.  I  will plan to have her followup with me in about 3 weeks as an outpatient  for further evaluation and a voiding trial if she does leave the  hospital with a catheter.  She may require urodynamics for further  evaluation if still demonstrating evidence for bladder dysfunction.      Franco Collet, NP      Raynelle Bring, MD  Electronically Signed    MA/MEDQ  D:  07/14/2008  T:  07/15/2008  Job:  [id number]

## 2010-10-24 NOTE — H&P (Signed)
[name], OTTEY NO.:  [id number]   MEDICAL RECORD NO.:  [id number]          PATIENT TYPE:  IPS   LOCATION:  4037                         FACILITY:  [facility]   PHYSICIAN:  Meredith Staggers, M.D.DATE OF BIRTH:  [birth year]   DATE OF ADMISSION:  07/06/2008  DATE OF DISCHARGE:                              HISTORY & PHYSICAL   ATTENDING PHYSICIAN:  Jarvis Morgan, M.D.   CHIEF COMPLAINT:  Low back pain and left foot pain.   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  She was admitted on June 23, 2008 with increasing back pain and right  greater than left lower extremity pain and fever with leukocytosis.  She  was found to have purulent blisters on the left foot and x-rays revealed  osteomyelitis of the second toe and nondisplaced fracture, left fourth  toe.  MRI revealed a grade 1 anterolisthesis of L4-L5 with multilevel  degenerative disk disease and facet arthropathy from L3 to S1.  Orthopedic was consulted for input regarding the foot and recommended IV  vancomycin and Zosyn for treatment.  She underwent a left second toe  amputation on June 24, 2008 by Dr. Rolena Infante.  Foot cultures positive  for MRSA and blood cultures positive for a group B strep.  She developed  acute renal failure, on vancomycin, requiring changing the antibiotics  to daptomycin.  Two-D echo was done on June 28, 2008 which showed no  abnormalities.  Plan for IV antibiotics, 4 weeks total.  Her main  complaint really is low back pain at this point.  The patient was felt  after rehab evaluation to be someone who could benefit from an inpatient  admission and thus was brought to the floor today.   REVIEW OF SYSTEMS:  Notable for low back pain, most notable when sitting  and standing.  She also has some problems with lying flat.  She  complains of pain into the right buttocks and then  posterior leg.  She  also had some recent diarrhea after initial constipation.  Appetite is  fair.  Denies shortness breath, chest pain, etc.  Full review of systems  is in the written H&P.   PAST MEDICAL HISTORY:  Positive for morbid obesity, type 2 diabetes with  neuropathy, decreased sensation in bilateral distal limbs.  Migraine  headaches, depression, mechanical back injuries, questionable CVA.   FAMILY HISTORY:  Positive for CAD and cancer.   SOCIAL HISTORY:  The patient shows a home with family, which is 2 levels  with one step to enter.  She lives on the first floor.  She was  independent working at [employer] prior to arrival.  She does not drink or  smoke.  Sister and family can assist at discharge.   ALLERGIES:  None.   HOME MEDICATIONS:  Gabapentin 600 mg 2 t.i.d., St. John's Wort, folic  acid, Glucotrol 10 mg daily, and stool softeners.   LABORATORY DATA:  Hemoglobin 10.1, white count 6.6, platelets 479.  Sodium 141, potassium 3.7, BUN  13, creatinine 1.6.   PHYSICAL EXAMINATION:  VITAL SIGNS:  Blood pressure is 138/93, pulse is  99, respiratory rate is 18, temperature 97.8.  GENERAL:  The patient is lying in bed, is an obese black female, in no  acute distress.  HEENT:  Pupils equal, round, reactive to light.  Ear, nose, and throat  exam is notable for upper set of dentures, but otherwise mucosa pink and  moist.  NECK: Supple without JVD or lymphadenopathy.  CHEST:  Clear to auscultation bilaterally without wheezes, rales, or  rhonchi.  HEART: Regular rate and rhythm without murmur, rubs, or gallops.  EXTREMITIES:  Showed no clubbing, cyanosis, or edema.  ABDOMEN:  Soft, nontender.  SKIN:  Notable for significant dryness over the sole and distal to the  sole and dorsum of the feet.  The left second toe is missing with  sutures in place and area is clean, dry, and intact.  No other breakdown  is appreciated on the foot.  She has a small area of what appears to be  a  healing wound on the right medial first toe.  NEUROLOGICAL:  The patient's cranial nerves are intact.  Reflexes are  1+.  Sensation is slightly decreased distally to fine touch, although  not substantially so.  Strength is 4/5 in the upper extremities proximal  and distal.  Lower extremity strength is 1 to 2/5 proximal and 4/5  distally.  Judgment, orientation, memory, and mood are all within  functional limits.  It is difficult to assess back truly due to her body  habitus and position in the bed today.   POST ADMISSION PHYSICIAN EVALUATION:  1. Functional deficits secondary to left foot osteomyelitis due to      MRSA infection.  Course was complicated by strep bacteremia, need      for left toe amputation, and acute renal failure.  The patient also      has significant lumbar spondylolisthesis and low back pain related      to the disk disease and facet arthropathy.  2. The patient is admitted and received collaborative      interdisciplinary care between the physiatrist, rehab nursing      staff, and therapy team.  3. The patient's level of medical complexity and substantial therapy      needs in context of that medical necessity cannot be provided at a      lesser intensity of care.  4. The patient has experienced substantial functional loss from her      baseline.  Upon rehab evaluation on June 30, 2008, the patient      was requiring max total assist for basic mobility, transfers,      ambulation.  As of most recent therapy evaluation, she is requiring      supervision for basic transfers.  She is able to walk 20 feet with      rolling walker and supervision, although limited by pain and      endurance.  She is requiring mid-to-moderate assist for what ADLs      could be attempted on acute.  She has not had a real voiding trial      as Foley was discontinued this morning.  Judging by the patient's      diagnosis, physical exam, and functional history, she has potential       for functional progress which will result in measurable gains while      in rehab.  The gains will be substantial  and practically used upon      discharge to home where her family will assist her in further needs      as appropriate.  Interim changes in the medical status since our      first preadmission screening are detailed in the history of present      illness above.  5. Physiatrist will provide 24-hour management of medical needs as      well as oversight of the therapy plan/treatment and provide      guidance as appropriate regarding interaction of the two.  The      medical problem list is listed below.  6. A 24-hour rehab nursing will assist in the management of the      patient's skin care needs as well as bowel and bladder function,      appropriate pain medication and treatment, nutritional concepts,      therapy needs, techniques, education, etc.  7. PT will assess and treat for lower extremity use, strengthening,      pain-coping strategies, adaptive equipment, family education, pre      gait and gait training.  Goals, modified independent.  8. OT will assess and treat for upper extremity use of adaptive      equipment, ADLs, appropriate family education, posture, etc.      Goals, supervision to modified independent.  9. Case management social worker will assess and treat for      psychosocial issues and discharge planning.  10.A team conference will be held weekly to assess progress towards      goals and determine barriers of discharge.  11.The patient has demonstrated sufficient medical stability and      exercise capacity to tolerate at least 3 hours therapy per day at      least 5 days per week.  12.Estimated length of stay is 7-10 days.  Prognosis is good.   MEDICAL PROBLEM LIST AND PLAN:  1. Persistent low-back pain.  As above.  This is related to      spondylolisthesis as well as lumbar disk disease and facet      arthropathy.  I do not see a real radicular  component to her pain      based on her MRI.  She may have some referred pain from her to      facets or sacroiliac joint.  Continue to treat aggressively with      analgesics, muscle relaxants, etc.  We will need to work on pain as      an outpatient with specific therapy targeted at this as well as      weight loss, home exercise program, etc.  The patient was on      Neurontin prior to arrival.  We will initiate Lyrica 50 mg b.i.d.      for perceived radicular pain.  We will use morphine sulfate      immediate release 15 mg q.4 h p.r.n. and add MS Contin 15 mg p.o.      b.i.d. as scheduled.  2. Diabetes type 2:  Increase Lantus for better control.  Cover with      sliding scale insulin for now.  3. GU:  Continue voiding trial and observe for PVRs and incontinence.  4. Bowels:  The patient has had recent loose stool after bowel clean      out.  Treat symptomatically for now.  We would like to begin stool      softeners at bedtime tomorrow.  5. DVT prophylaxis.  Subcu Lovenox for now.  6. MRSA osteomyelitis:  Continue contact precautions.  Day 13 of 28 of      daptomycin.  Cipro IV 400 mg p.o. q.12 h continue this as well for      4 weeks total.  7. Orthopedic:  Continue toe-down weightbearing, left heel with Darko      shoe and nonweightbearing on left forefoot.  8. Skin care issues:  Left foot looks good.  Stay with protective      measures.  Place Mepilex to midback and sacrum q. 3-5 days.      Meredith Staggers, M.D.  Electronically Signed     ZTS/MEDQ  D:  07/06/2008  T:  07/07/2008  Job:  [id number]

## 2010-10-24 NOTE — Assessment & Plan Note (Signed)
[name] is back regarding her left foot osteoarthritis and chronic low  back pain.  She was on rehab until July 19, 2008.  She has been home  with her sister in [place], now in [place], [place].  She has  been doing better overall from a pain standpoint even after running out  of her morphine several days ago.  She rates her pain as 6/10, described  as sharp, stabbing, aching, and constant.  She feels that her back has  been doing better as her activity has improved.  She states that pain  interferes with general activity, relations with others, and enjoyment  of life on a severe level.  Sleep is poor in general, but is better with  Lyrica and control of her foot pain.  She notes back pain generally  present most of the time.  Her left foot and ankle seem to bother her  when it is cold and damp outside.   The patient had undergone a left second toe amputation on June 24, 2008, by Dr. Rolena Infante.  She has not seen him in followup as of yet.  Wound  has been healing slowly but consistently.  She denies any fevers, skin  breakdown, etc.   The patient does report some lightheadedness and dizziness.  She is off  the antibiotics currently.  She remains on Lopressor and Lyrica, as well  as Norvasc, Flexeril and Elavil for blood pressure and pain control.  She also uses lidocaine patches.   REVIEW OF SYSTEMS:  Notable for the above as well as some numbness,  tingling, spasms, depression, confusion, bowel and bladder issues,  nausea, vomiting, high sugars, weight gain, shortness of breath.  Full  review is in the written health and history section of the chart.   SOCIAL HISTORY:  The patient is divorced, living with sisters.   PHYSICAL EXAMINATION:  VITAL SIGNS:  Blood pressure is 98/56 sitting,  80/39 standing.  Pulse is 106, respiratory rate 18.  She is sating 99%  on room air.  GENERAL:  The patient is generally pleasant and alert.  She remains  morbidly obese.  HEART:   Tachycardic.  CHEST:  Clear.  ABDOMEN:  Soft, nontender.  MUSCULOSKELETAL:  Both arms had normal motor and function range of  motion.  Low back had more pain with flexion and extension today.  She  had good sitting posture.  Left second toe was generally clean with mild  fibronecrotic tissue over the open area in the wound which measured  about a centimeter in length and half a centimeter in width.  There was  no swelling in the foot.  Sensation in both feet were decreased to  pinprick and light touch today.  Strength in the legs is in the 5/5  range.  The patient had some pain in the back and leg with seated slump  test today.  Reflexes are 1+ throughout.   ASSESSMENT:  1. Left foot osteomyelitis secondary to methicillin-resistant      Staphylococcus aureus.  2. Diabetes type 2, poorly controlled.  3. History of urinary retention and suprapubic catheter.  4. Hypertension with now low blood pressures recorded.  5. Situational anxiety.  6. Chronic low back pain and spondylolisthesis with stenosis.  7. Peripheral neuropathy related to diabetes.  8. Morbid obesity.   PLAN:  1. Continue wound care.  I have recommended gentle debridement of the      area with the gauze pad prior to application of a dry  dressing.  2. We will get the patient started on outpatient therapy to work on      gait, ADL, low back range of motion, pain management etc.  I think,      she will be doing here at [facility]  3. We will hold the Norvasc.  I asked the patient to check blood      pressure and if it continues low and the patient is symptomatic, we      will need to look at holding Lopressor as well.  4. Encourage appropriate weight loss and dietary measures.  5. We will resume MS Contin 15 mg 1 q.12 h #60 with goal of      discontinuing this in the near future.  6. Refilled morphine sulfate immediate release 1 q.6 h p.r.n. #60.  7. Continue Lyrica for neuropathic pain.  She can use  lidocaine      patches for local pain, Flexeril for spasm.  8. We will see her back in about 1 month with nursing, 2 months with      me.      Meredith Staggers, M.D.  Electronically Signed     ZTS/MedQ  D:  08/24/2008 13:13:20  T:  08/25/2008 02:24:01  Job #:  [id number]

## 2010-10-24 NOTE — Op Note (Signed)
NAMEJEMMA, [name] NO.:  [id number]   MEDICAL RECORD NO.:  [id number]          PATIENT TYPE:  INP   LOCATION:  5013                         FACILITY:  [facility]   PHYSICIAN:  Dahlia Bailiff, MD    DATE OF BIRTH:  Jul 02, 1960   DATE OF PROCEDURE:  06/24/2008  DATE OF DISCHARGE:                               OPERATIVE REPORT   PREOPERATIVE DIAGNOSIS:  Diabetic osteomyelitis, left second toe.   POSTOPERATIVE DIAGNOSIS:  Diabetic osteomyelitis, left second toe.   OPERATIVE PROCEDURE:  Second toe amputation, left foot.   COMPLICATIONS:  None.   CONDITION:  Stable.   HISTORY:  She is a very pleasant 50 year old woman who presents with a 1-  month history of a blister noted and it has had increasing drainage for  sometime now.  She had fevers, elevated white count, and x-rays  consistent with bony destruction.  Clinical and radiographic analysis  confirmed osteomyelitis secondary to the diabetes.  Because of her  increasing fevers and elevated white count, decision was made to take  her to the operating room this evening for an amputation.   All appropriate risks, benefits, and alternatives were discussed with  the patient and consent was obtained.   OPERATIVE NOTE:  The patient was seen in the holding area.  The correct  digit was confirmed by the patient and I marked the extremity and toe.  The patient was brought to the operating room.  The left lower extremity  was prepped and draped in the standard fashion.   Upon palpation of the toe, gross pus was expressed from the second toe.  A tennis racket incision was made, starting on the dorsal surface of the  foot and extending in a circumferential manner around the base of the  second toe.  The metatarsal proximal phalangeal joint was resected,  thereby removing the entire infected toe.  The appropriate  intraoperative cultures were obtained and IV antibiotics were started at  that time.  In addition, the toe  was sent to pathology for appropriate  micro and pathological evaluation.   Hemostasis was obtained using pressure and minimal use of the cautery.  Once we had obtained hemostasis, I then closed the wound with  interrupted 2-0 Prolene sutures.  The wound closed without complication.  A bulky dry  dressing was applied and the patient was transferred to the PACU without  incident.  Should be noted the anesthesia type was local with sedation.   FIRST ASSISTANT:  __________PA.      Dahlia Bailiff, MD  Electronically Signed     DDB/MEDQ  D:  06/24/2008  T:  06/25/2008  Job:  [id number]

## 2010-10-27 ENCOUNTER — Ambulatory Visit: Payer: Self-pay | Admitting: Physical Medicine & Rehabilitation

## 2010-10-27 ENCOUNTER — Ambulatory Visit: Payer: Self-pay | Admitting: Physical Therapy

## 2010-10-27 NOTE — Discharge Summary (Signed)
NAMENOBIA, WOLTHUIS NO.:  [id number]   MEDICAL RECORD NO.:  [id number]          PATIENT TYPE:  IPS   LOCATION:  4037                         FACILITY:  [facility]   PHYSICIAN:  Jarvis Morgan, M.D.   DATE OF BIRTH:  1961-04-18   DATE OF ADMISSION:  07/06/2008  DATE OF DISCHARGE:  07/19/2008                               DISCHARGE SUMMARY   DISCHARGE DIAGNOSES:  1. Left foot osteomyelitis secondary to methicillin-resistant      Staphylococcus aureus and strep bacteremia with low back pain      secondary to lumbar spondylolisthesis.  2. Diabetes mellitus type 2, poorly controlled.  3. Urinary retention, question neurogenic bladder.  4. Hypertension.  5. Tachycardia, resolved.  6. Situational anxiety.   HISTORY OF PRESENT ILLNESS:  Ms. Harville is a 50 year old female with  history of diabetes mellitus, multiple back injuries admitted on June 23, 2008 with increasing back pain, right lower extremity greater than  left lower extremity pain, fevers, and leukocytosis.  The patient was  noted to have purulent blisters on the left foot at time of admission  and x-rays done revealed osteomyelitis, left second toe.  Also, question  of nondisplaced fracture, left fourth toe.  MRI of L-spine showed grade  1 slip, L4-L5 with mild stenosis.  Ortho was consulted for input and the  patient was started on IV vancomycin and IV Zosyn for treatment  initially.  She also underwent left second toe amputation on June 24, 2008 by Dr. Rolena Infante.  Foot cultures were positive for MRSA and blood  cultures positive for group B strep.  The patient developed acute renal  failure on IV vancomycin, requiring change of antibiotics to IV  daptomycin.  A 2-D echo was done on June 28, 2008 revealing EF of 55-  65%, no vegetation.  Recommendations are for IV antibiotics to continue  for 4 total weeks.  Currently, therapies are ongoing with the patient  noted to be limited by low back pain.   Also noted to be deconditioned.  The patient was evaluated by physiatrist and felt to be a patient who  would benefit from inpatient rehab stay.   PAST MEDICAL HISTORY:  1. Morbid obesity.  2. DM type 2 with neuropathy and decreased sensation bilateral lower      extremities.  3. History of migraines.  4. Depression.  5. Mechanical back injuries at work.  6. Questionable history of CVA.   ALLERGIES:  No known drug allergies.   REVIEW OF SYMPTOMS:  The patient with complaints of low back pain, most  notable with sitting and standing.  Pain in right buttock and posterior  leg.  Some issues with recent diarrhea.   FAMILY HISTORY:  Positive for coronary artery disease and cancer.   SOCIAL HISTORY:  The patient shares the home with family.  Lives in two-  level home with one-step at entry.  Stays on first level and son lives  on the second level.  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  Does not use any tobacco or  alcohol.  She has a  supportive family that will assist past discharge.   FUNCTIONAL HISTORY:  The patient was independent driving prior to  admission.  Had been on short-term disability a few weeks prior to  admission.   FUNCTIONAL STATUS:  The patient is min to mod assist for toileting with  OT.  Supervision for transfers.  Supervision for ambulating 20 feet with  difficulty advancing left lower extremity.   PHYSICAL EXAMINATION:  VITAL SIGNS:  Blood pressure 138/93, pulse 99,  respiratory rate 18, and temperature 97.8.  GENERAL:  The patient is obese black female lying in bed in no acute  distress.  HEENT:  Pupils equal, round, and reactive to light.  Nares patent.  Hearing appears to be intact.  Oral mucosa is notable for a set of upper  dentures.  NECK:  Supple without JVD or lymphadenopathy.  CHEST:  Clear to auscultation bilaterally without wheezes, rales, or  rhonchi.  EXTREMITIES:  No evidence of clubbing, cyanosis, or edema.   ABDOMEN:  Soft and nontender with positive bowel sounds.  SKIN:  Notable for significant dryness over distal sole to dorsum of  feet.  Left second toe is missing with sutures in place.  The area is  clean, dry, and intact.  She has a small area of healing wound, right  medial first toe.  The back area is also noted to have two linear areas  of breakdown, mid back and gluteal cleft.  NEUROLOGIC:  Cranial nerves intact.  Reflexes 1+.  Sensation slightly  decreased to fine touch, though not substantially.  Strength is 4/5 in  upper extremity proximal and distal.  Lower extremity is 1-2/5 proximal  and 4/5 distal.  Judgment, orientation, memory, and mood are within  functional limits.   HOSPITAL COURSE:  Ms. Madhu Crotteau was admitted to Rehab on July 06, 2008 for inpatient therapies to consist of PT, OT at least 3 hours 5  days a week.  Past admission, physiatrist, Rehab RN, and therapy team  have worked together to provide customized collaborative  interdisciplinary care.  During the patient's stay in Rehab, weekly team  conferences were held to assess the patient's progress, set goals as  well as discuss barriers to discharge.  Rehab RN has been assisting with  skin care and wound care monitoring.  They have been working with the  patient on pressure relief measures to help with prevention of further  breakdown.  They have also been monitoring the patient's p.o. intake  with offering of supplementation on p.r.n. basis.  Initially, the  patient with much complaints regarding pain, especially with mobility  and pain.  Medication regimen was adjusted to help improve the patient's  participation in therapy.  The patient had labs checked on July 07, 2008 revealing hemoglobin 10.3, hematocrit 30.5, white count 6.3, and  platelet 473.  Check of lytes revealed sodium 137, potassium 3.4,  chloride 102, CO2 27, BUN 14, creatinine 1.53, and glucose 123.  LFTs  revealed SGOT 20, SGPT 20,  alkaline phos 65, T-bili 0.5, total protein  6.3, and albumin 2.1.  The patient's blood sugars were monitored on  before meals and nightly basis.  Rehab RN has been assisting the patient  and family regarding insulin administration and management.  The  patient's blood sugars were noted to be variable and Lantus insulin was  titrated for tighter blood sugar control.  At time of discharge, the  patient's blood sugars were ranging from 230s-140s.  Lantus was  increased by 2 units in a.m. and p.m. at time of discharge.  The patient  was also advised to check blood sugars on before meals and nightly basis  for now and follow up with LMD for further adjustments in her  hypoglycemic protocol.  The patient's blood pressures were monitored on  b.i.d. basis.  These were noted to be labile, ranging from [id number]  systolic.  Norvasc was increased to 10 mg p.o. q.a.m.  The patient's  heart rate has also been tachycardic ranging from 90s-99 in part  secondary to deconditioning.  On the a.m. of July 06, 2008, the  patient reported increase in fatigue with malaise.  With her ADLs that  a.m., she was noted to be tachycardic with heart rate in 120s.  EKG done  showed sinus tach with heart rate of 119 beats per minute, no acute  changes.  Chest x-ray was done to rule out pneumonia or CHF for cause of  tachycardia.  Chest x-ray of July 12, 2008 showed stable cardiomegaly  and lungs were noted to be clear.  Cardiac enzymes x3 were drawn and  these were noted to be negative.  CT of chest was done to rule out PE  with the patient's recent immobility.  CTA revealed low lung volumes  with bibasilar atelectasis and no evidence for acute PE.  The patient  was started on low-dose beta-blocker 25 mg b.i.d. with improvement in  heart rate overall.  At time of discharge, the patient's heart rate was  ranging from mid 90-100 range.  The patient has had serial labs checked  to monitor hydration status as well as  anemia.  Last check of July 12, 2008 revealed hemoglobin 9.8, hematocrit 29.4, white count 5.0, and  platelet 403.  Check of lytes revealed sodium 136, potassium 3.6,  chloride 102, CO2 27, BUN 14, creatinine 1.37, and glucose 195.  CPK is  elevated at 212.  The patient to have another CK level drawn on July 20, 2008.  The patient currently continues on Cubicin with pharmacy  recommendation to check CKs q. week while on this.   The patient was noted to have some issues with voiding additionally on  July 12, 2008.  The patient was noted to have urinary retention with  PVR at 689 mL.  Therefore, Foley was placed secondary to ongoing medical  issues.  Foley was discontinued on July 13, 2008 and voiding was  monitored with PVR checks.  The patient was noted to be voiding with  high PVRs ranging from 450-600 mL.  She was started on low-dose  Urecholine without much improvement in voiding.  GU was consulted for  input on the patient's voiding function.  They recommended continuing in  and out caths versus indwelling Foley with a concern of neurogenic  impairments of bladder function.  The patient was very hesitant about  doing in and out caths past discharge and elected on having indwelling  Foley for now.  She is to follow up with Dr. Alinda Money in the next couple  of weeks for voiding trial and for further evaluation as indicated.  The  patient was maintained on IV Flagyl, daptomycin, and Cipro through most  of her stay.  She was initially set up for discharge on July 16, 2008.  There were issues about insurance fully covering the IV  antibiotics with high co-pay.  Family Practice was consulted about need  of the patient requiring all 3 IV  antibiotics.  They felt the patient  could be changed to p.o. Cipro and Flagyl.  However, recommended  continuing IV daptomycin for 4 total weeks.  The patient was changed  over to oral Cipro and Flagyl on July 16, 2008.  IV daptomycin to   continue through July 21, 2008.  She was treated with last dose of  daptomycin on July 19, 2008.  She had been set up with [facility] that will provide an Katherine RN to help with  administration of IV antibiotics.  Currently, a PICC line is in place  and is intact.  [facility] to discontinue PICC on July 21, 2008 past  last dose of IV antibiotic administered.   The patient was limited by pain initially.  Lidocaine patches  additionally were added to back to help with her symptomatology.  Also,  Rehab RN has been assisting by premedicating the patient with MSIR prior  to a.m. and p.m. therapies.  At time of admission, the patient was noted  to be at min to mod assist for bathing and dressing tasks, total assist  for toileting, and min assist for bedside commode transfers.  She was  noted to be limited by decreased endurance with poor exercise tolerance,  pain control issues with decrease in functional mobility.  At time of  admission, the patient was able to ambulate 15-30 feet with rolling  walker with min assist.  Noted to have antalgic gait on right lower  extremity secondary to back pain.  She did require some rest breaks with  ambulation secondary to hip and back pain.  PT has been educating the  patient on sitting tolerance, bed mobility as well as pain management.  They have been working with the patient and family regarding positioning  in bed to decrease pressure and back pain.  Upper extremity exercises  have been ongoing for strengthening and coordination including breathing  awareness.  Family has been very supportive and has been here for more  therapy sessions.  The patient is currently at supervision level for  transfers, supervision level for ambulating up to 50 feet with cues to  tighten abdominals on lifting walker.  She is able to navigate 2 steps  with rolling walker backwards with min assist.  She did express some  hesitation with use  of walker backwards.  She was tried with crutches  for navigation of stairs and she was able to navigate 15 stairs with  right rail with crutches at modified independent levels.  The patient's  caregiver was instructed and has assisted with helping the patient  navigate stairs with verbal cues.  The patient and caregiver are also  able to perform car transfers with supervision, requires demonstration  x2.  The patient is currently modified independent for wheelchair  mobility of 200 feet.  OT evaluation at admission revealed the patient  had min assist to bath feet.  She did require total assist for toileting  due to increased weakness.  She was noted have decrease in activity  tolerance.  Self-care retraining has focused at sink level with working  on weightbearing precautions on left lower extremity as well as  improving activity tolerance.  The patient was provided with LSO for  back support to help with pain management and this has been effective to  a part.  Currently, the patient is able to don foot brace and back brace  independently.  She is modified independent for transfers from low  surfaces.  Modified independent for upper and lower body dressing as  well as toileting.  The patient is able to bathe 10/10 body parts at  modified independent level.  Showers were not done during the patient's  stay due to PICC line currently in place.  The patient will continue to  receive further followup Home Health PT, OT by [facility] past  discharge.  The patient's family is to provide supervision assistance as  needed.  The patient's length of stay was extended by couple of days due  to inclement weather.  On July 19, 2008, the patient was discharged  to home with ongoing outpatient therapies.   DISCHARGE MEDICATIONS:  1. Elavil 75 mg at bedtime.  2. Flexeril 5 mg q.8 h.  3. Lyrica 50 mg b.i.d.  4. Lopressor 25 mg b.i.d.  5. Norvasc 10 mg a day.  6. Xanax 0.25 mg q.6-8 h,  p.r.n. anxiety.  7. Lidocaine patches 2-3 to mid lower back, on 8 a.m. and off 8:00      p.m. daily.  8. Flagyl 500 mg q.8 hours at 8:00 a.m., 4:00 p.m., and midnight.  9. Cipro 500 mg b.i.d. at 6:00 a.m. and 6 p.m.  10.Cubicin 750 mg IV around noon.  Antibiotics to continue through      July 21, 2008. 11.MS Contin 15 mg one p.o. q.12 h., #60 prescription for this month      with additional prescription for #60 for next month.  12.MSIR 15 mg one p.o. q.6-8 h. p.r.n. pain, #45 prescription for this      month with a prescription for #45 for next month also given.  13.Lantus insulin 7 units subcu in a.m. and 17 units subcu with      supper.   DIET:  Diabetic diet, low-fat.   WOUND CARE:  Keep toe area clean and dry with Darco shoe on at all  times, nonweightbearing on right forefoot, and touchdown weightbearing  left heel.   ACTIVITY LEVEL:  Intermittent supervision.  No strenuous activity.  No  alcohol, no smoking, and no driving.   SPECIAL INSTRUCTIONS:  Do not use gabapentin, Glucotrol, or [facility]  wort.  Check blood sugars on b.i.d. rotating basis for now.  Antibiotics  to continue through July 21, 2008.  [facility] to provide PT  and RN.   FOLLOWUP:  The patient to follow up with Dr. Naaman Plummer, September 24, 2008 at  11:20 for 11:40 appointment.  Follow up with Dr. Delma Freeze at May 22, 2009 at 11:00 a.m.  Follow up with Dr. Rolena Infante for postop check in 2  weeks for input regarding weightbearing status and return to work.  Follow up with Dr. Alinda Money for voiding trial in the next few weeks.      Thornton Dales, P.A.    ______________________________  Jarvis Morgan, M.D.    PP/MEDQ  D:  07/20/2008  T:  07/20/2008  Job:  [id number]   cc:   Lucy Chris, MD  Dahlia Bailiff, MD  Raynelle Bring, MD  Ishmael Holter. Sarajane Jews, MD

## 2010-10-27 NOTE — Assessment & Plan Note (Signed)
[facility] OFFICE NOTE   [name], [name]                         MRN:          [id number]  DATE:10/11/2006                            DOB:          Oct 23, 1960    This is a 50 year old woman here to establish with our practice with  several ongoing complaints. She previously received her primary care  through the  [facility] in [place]. Apparently this is set up to treat  patients with no insurance. She recently regained her insurance and has  now switched to [place]. She has been having ongoing problems with headaches.  She was diagnosed with migraine headaches many years ago but for some  reason has never been given a Triptan product to try apparently. She has  simply been given narcotic type pain medications either in oral form or  in shot form. Also she would like to be treated for depression. She has  a long history of depression and anxiety and has been on several agents  over the years. She has tried Zoloft, Paxil, and Lexapro in the past.  All of these were stopped because they either did not work or they had  sexual side effects for her including decreased libido. She now is  having some struggles with daily sadness and occasional tearfulness. She  denies any suicidal ideations. She also finds that her patience is  growing thin and she gets irritable quite easily. She does sleep  reasonably well. She has diabetes and her Glucometer broke a little over  a year ago. She is asking for me to supply her a new one.   OTHER PAST MEDICAL HISTORY:  She was diagnosed with diabetes about 5  years ago. Her last blood work including hemoglobin A1C was in January  of this year. She says that it seems to be fairly well controlled. She  is also developed diabetic neuropathy in the feet which consists of  burning type pains and tingling. She has been on Neurontin now for the  past 1 year and she thinks it  does help somewhat. She tries to watch her  diet but admits to getting very little exercise. Otherwise she was  involved with an injury on the job on February 18th of this year when  she was trying to support a patient who was falling and she helped catch  him before he fell, and in so doing she twisted her lower back and has  had trouble with pain in the lower back ever since. 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 Sela Hilding through  the [facility]. She had 1 set of plain  films that was negative. She had been treated with rest and pain  medications. She is due to begin physical therapy next week. She is  working full time but has a restriction of not lifting more than 15  pounds per Dr. Sela Hilding. She is a gravida 1, para 1 having had 1 vaginal  delivery. She had tympanoplasty to repair a perforation in  1 ear in  1968. She has degenerative arthritis as well as gastroesophageal reflux  disease. She has high cholesterol but is currently taking nothing for  it.   ALLERGIES:  None.   CURRENT MEDICATIONS:  1. Neurontin 600 mg per day.  2. Glipizide 10 mg per day.  3. Etodolac 400 mg 2 or 3 times a day as needed for pain.  4. Metformin 500 mg 2 tablets b.i.d.  5. Cyclobenzaprine 10 mg t.i.d. as needed.  6. Phenergan 25 mg tablets as needed.  7. Vicodin as needed for pain.   HABITS:  She does not use tobacco. She drinks some alcohol.   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   FAMILY HISTORY:  Remarkable for breast cancer, heart disease,  hypertension, and diabetes all in her family.   OBJECTIVE:  Height 5 feet 7 inches, weight 295, blood pressure 132/78,  pulse 84 and regular.  IN GENERAL: She is in no distress. I did not do a detailed examination  on her today. Her affect is very slightly depressed but she is  appropriate and has good eye contact.    ASSESSMENT/PLAN:  1. Type 2 diabetes mellitus, she is fasting so we will check      laboratories today including a urine microalbumin, a metabolic      panel, a hemoglobin A1C, and a lipid panel. Also I wrote for her to      have her own Glucometer again and to test her sugars once daily.  2. Migraine headaches, gave her samples and a prescription for Imitrex      100 mg tablets to use as needed.  3. Depression, we will try Celexa 20 mg once daily hopefully she can      avoid side effects with this agent. I asked to see her back in 3 to      4 weeks for a follow up visit.  4. Low back pain, she will follow up with Dr. Sela Hilding.     Ishmael Holter. Sarajane Jews, MD  Electronically Signed    SAF/MedQ  DD: 10/11/2006  DT: 10/11/2006  Job #: [id number]

## 2010-10-30 ENCOUNTER — Ambulatory Visit: Payer: Self-pay | Admitting: Physical Therapy

## 2010-10-30 ENCOUNTER — Ambulatory Visit: Payer: Self-pay | Admitting: Speech Pathology

## 2010-11-01 ENCOUNTER — Ambulatory Visit: Payer: Self-pay | Admitting: Physical Therapy

## 2010-11-01 ENCOUNTER — Ambulatory Visit: Payer: Self-pay

## 2010-11-08 ENCOUNTER — Ambulatory Visit: Payer: Self-pay | Admitting: Physical Therapy

## 2010-11-08 ENCOUNTER — Ambulatory Visit: Payer: Self-pay

## 2010-11-09 ENCOUNTER — Ambulatory Visit: Payer: Self-pay | Admitting: Physical Therapy

## 2010-11-10 ENCOUNTER — Ambulatory Visit: Payer: Self-pay | Admitting: Physical Therapy

## 2011-03-20 LAB — BASIC METABOLIC PANEL
BUN: 7
Calcium: 9.2
Creatinine, Ser: 0.63
GFR calc Af Amer: >60

## 2011-04-21 ENCOUNTER — Encounter (HOSPITAL_COMMUNITY): Payer: Self-pay | Admitting: *Deleted

## 2011-04-21 ENCOUNTER — Emergency Department (HOSPITAL_COMMUNITY)
Admission: EM | Admit: 2011-04-21 | Discharge: 2011-04-21 | Discharge status: Against Medical Advice | Payer: Medicaid Other | Source: Home / Self Care

## 2011-04-21 ENCOUNTER — Other Ambulatory Visit: Payer: Self-pay

## 2011-04-21 DIAGNOSIS — R079 Chest pain, unspecified: Secondary | ICD-10-CM | POA: Insufficient documentation

## 2011-04-21 DIAGNOSIS — M79609 Pain in unspecified limb: Secondary | ICD-10-CM | POA: Insufficient documentation

## 2011-04-21 NOTE — ED Notes (Signed)
Pt is here with left big toe pain and / infection.  Family sts it looks infected.

## 2011-04-21 NOTE — ED Notes (Signed)
Printed two old ekgs from [facility]. 

## 2011-04-21 NOTE — ED Notes (Signed)
Spoke with family and pt.  Pt would like to have issue resolved on Monday at PCP, apologized for the delay

## 2011-04-22 ENCOUNTER — Encounter (HOSPITAL_COMMUNITY): Payer: Self-pay | Admitting: Internal Medicine

## 2011-04-22 ENCOUNTER — Inpatient Hospital Stay (HOSPITAL_COMMUNITY)
Admission: EM | Admit: 2011-04-22 | Discharge: 2011-04-26 | DRG: 065 | Disposition: A | Discharge status: Rehabilitation Facility | Payer: Medicaid Other | Attending: Internal Medicine | Admitting: Internal Medicine

## 2011-04-22 ENCOUNTER — Other Ambulatory Visit: Payer: Self-pay

## 2011-04-22 ENCOUNTER — Emergency Department (HOSPITAL_COMMUNITY): Payer: Medicaid Other

## 2011-04-22 DIAGNOSIS — N19 Unspecified kidney failure: Secondary | ICD-10-CM

## 2011-04-22 DIAGNOSIS — D649 Anemia, unspecified: Secondary | ICD-10-CM

## 2011-04-22 DIAGNOSIS — E11621 Type 2 diabetes mellitus with foot ulcer: Secondary | ICD-10-CM

## 2011-04-22 DIAGNOSIS — L600 Ingrowing nail: Secondary | ICD-10-CM | POA: Diagnosis present

## 2011-04-22 DIAGNOSIS — R51 Headache: Secondary | ICD-10-CM

## 2011-04-22 DIAGNOSIS — E669 Obesity, unspecified: Secondary | ICD-10-CM

## 2011-04-22 DIAGNOSIS — R4789 Other speech disturbances: Secondary | ICD-10-CM | POA: Diagnosis present

## 2011-04-22 DIAGNOSIS — E118 Type 2 diabetes mellitus with unspecified complications: Secondary | ICD-10-CM | POA: Insufficient documentation

## 2011-04-22 DIAGNOSIS — I639 Cerebral infarction, unspecified: Secondary | ICD-10-CM

## 2011-04-22 DIAGNOSIS — N179 Acute kidney failure, unspecified: Secondary | ICD-10-CM | POA: Diagnosis present

## 2011-04-22 DIAGNOSIS — E119 Type 2 diabetes mellitus without complications: Secondary | ICD-10-CM

## 2011-04-22 DIAGNOSIS — H53469 Homonymous bilateral field defects, unspecified side: Secondary | ICD-10-CM | POA: Diagnosis present

## 2011-04-22 DIAGNOSIS — M869 Osteomyelitis, unspecified: Secondary | ICD-10-CM

## 2011-04-22 DIAGNOSIS — I429 Cardiomyopathy, unspecified: Secondary | ICD-10-CM

## 2011-04-22 DIAGNOSIS — I6340 Cerebral infarction due to embolism of unspecified cerebral artery: Principal | ICD-10-CM | POA: Diagnosis present

## 2011-04-22 DIAGNOSIS — L97509 Non-pressure chronic ulcer of other part of unspecified foot with unspecified severity: Secondary | ICD-10-CM | POA: Diagnosis present

## 2011-04-22 DIAGNOSIS — R29810 Facial weakness: Secondary | ICD-10-CM | POA: Diagnosis present

## 2011-04-22 DIAGNOSIS — F411 Generalized anxiety disorder: Secondary | ICD-10-CM

## 2011-04-22 DIAGNOSIS — A4902 Methicillin resistant Staphylococcus aureus infection, unspecified site: Secondary | ICD-10-CM

## 2011-04-22 DIAGNOSIS — N189 Chronic kidney disease, unspecified: Secondary | ICD-10-CM | POA: Diagnosis present

## 2011-04-22 DIAGNOSIS — I82409 Acute embolism and thrombosis of unspecified deep veins of unspecified lower extremity: Secondary | ICD-10-CM

## 2011-04-22 DIAGNOSIS — K219 Gastro-esophageal reflux disease without esophagitis: Secondary | ICD-10-CM

## 2011-04-22 DIAGNOSIS — I6350 Cerebral infarction due to unspecified occlusion or stenosis of unspecified cerebral artery: Secondary | ICD-10-CM

## 2011-04-22 DIAGNOSIS — M545 Low back pain, unspecified: Secondary | ICD-10-CM

## 2011-04-22 DIAGNOSIS — R000 Tachycardia, unspecified: Secondary | ICD-10-CM

## 2011-04-22 DIAGNOSIS — M1990 Unspecified osteoarthritis, unspecified site: Secondary | ICD-10-CM

## 2011-04-22 DIAGNOSIS — I10 Essential (primary) hypertension: Secondary | ICD-10-CM

## 2011-04-22 DIAGNOSIS — G609 Hereditary and idiopathic neuropathy, unspecified: Secondary | ICD-10-CM

## 2011-04-22 DIAGNOSIS — G459 Transient cerebral ischemic attack, unspecified: Secondary | ICD-10-CM

## 2011-04-22 DIAGNOSIS — G4730 Sleep apnea, unspecified: Secondary | ICD-10-CM

## 2011-04-22 DIAGNOSIS — IMO0001 Reserved for inherently not codable concepts without codable children: Secondary | ICD-10-CM | POA: Diagnosis present

## 2011-04-22 DIAGNOSIS — F329 Major depressive disorder, single episode, unspecified: Secondary | ICD-10-CM

## 2011-04-22 DIAGNOSIS — I129 Hypertensive chronic kidney disease with stage 1 through stage 4 chronic kidney disease, or unspecified chronic kidney disease: Secondary | ICD-10-CM | POA: Diagnosis present

## 2011-04-22 DIAGNOSIS — F3289 Other specified depressive episodes: Secondary | ICD-10-CM

## 2011-04-22 DIAGNOSIS — E785 Hyperlipidemia, unspecified: Secondary | ICD-10-CM

## 2011-04-22 DIAGNOSIS — I428 Other cardiomyopathies: Secondary | ICD-10-CM | POA: Diagnosis present

## 2011-04-22 HISTORY — DX: Tachycardia, unspecified: R00.0

## 2011-04-22 HISTORY — DX: Cerebral infarction, unspecified: I63.9

## 2011-04-22 LAB — PROTIME-INR
INR: 1.14 (ref 0.00–1.49)
Prothrombin Time: 14.8 seconds (ref 11.6–15.2)

## 2011-04-22 LAB — COMPREHENSIVE METABOLIC PANEL
ALT: 7 U/L (ref 0–35)
Alkaline Phosphatase: 84 U/L (ref 39–117)
BUN: 24 mg/dL — ABNORMAL HIGH (ref 6–23)
CO2: 25 mEq/L (ref 19–32)
GFR calc Af Amer: 49 mL/min — ABNORMAL LOW (ref >90)
GFR calc non Af Amer: 42 mL/min — ABNORMAL LOW (ref >90)
Glucose, Bld: 273 mg/dL — ABNORMAL HIGH (ref 70–99)
Potassium: 4.5 mEq/L (ref 3.5–5.1)
Sodium: 136 mEq/L (ref 135–145)

## 2011-04-22 LAB — DIFFERENTIAL
Eosinophils Relative: 2 % (ref 0–5)
Lymphocytes Relative: 25 % (ref 12–46)
Lymphs Abs: 1.8 10*3/uL (ref 0.7–4.0)
Monocytes Relative: 8 % (ref 3–12)
Neutrophils Relative %: 64 % (ref 43–77)

## 2011-04-22 LAB — URINALYSIS, ROUTINE W REFLEX MICROSCOPIC
Bilirubin Urine: NEGATIVE
Ketones, ur: NEGATIVE mg/dL
Nitrite: NEGATIVE
Protein, ur: >300 mg/dL — AB
Urobilinogen, UA: 1 mg/dL (ref 0.0–1.0)

## 2011-04-22 LAB — CBC
Hemoglobin: 8.6 g/dL — ABNORMAL LOW (ref 12.0–15.0)
MCH: 26.1 pg (ref 26.0–34.0)
MCV: 86 fL (ref 78.0–100.0)
Platelets: 412 10*3/uL — ABNORMAL HIGH (ref 150–400)
RBC: 3.29 MIL/uL — ABNORMAL LOW (ref 3.87–5.11)
WBC: 7 10*3/uL (ref 4.0–10.5)

## 2011-04-22 LAB — URINE MICROSCOPIC-ADD ON

## 2011-04-22 LAB — CARDIAC PANEL(CRET KIN+CKTOT+MB+TROPI): Relative Index: INVALID (ref 0.0–2.5)

## 2011-04-22 LAB — PREPARE RBC (CROSSMATCH)

## 2011-04-22 LAB — APTT: aPTT: 41 seconds — ABNORMAL HIGH (ref 24–37)

## 2011-04-22 MED ORDER — HYDROCHLOROTHIAZIDE 12.5 MG PO CAPS
12.5000 mg | ORAL_CAPSULE | Freq: Every day | ORAL | Status: DC
  Administered 2011-04-22 – 2011-04-24 (×3): 12.5 mg via ORAL
  Filled 2011-04-22 (×6): qty 1

## 2011-04-22 MED ORDER — SODIUM CHLORIDE 0.9 % IV SOLN
INTRAVENOUS | Status: DC
  Administered 2011-04-23: 1000 mL via INTRAVENOUS
  Administered 2011-04-24: 10:00:00 via INTRAVENOUS

## 2011-04-22 MED ORDER — GABAPENTIN 300 MG PO CAPS
600.0000 mg | ORAL_CAPSULE | Freq: Four times a day (QID) | ORAL | Status: DC
  Administered 2011-04-22 – 2011-04-23 (×4): 600 mg via ORAL
  Administered 2011-04-23: 300 mg via ORAL
  Administered 2011-04-24 – 2011-04-26 (×7): 600 mg via ORAL
  Filled 2011-04-22 (×23): qty 2

## 2011-04-22 MED ORDER — SIMVASTATIN 20 MG PO TABS
20.0000 mg | ORAL_TABLET | Freq: Every day | ORAL | Status: DC
  Administered 2011-04-23 – 2011-04-24 (×2): 20 mg via ORAL
  Filled 2011-04-22 (×5): qty 1

## 2011-04-22 MED ORDER — ASPIRIN 325 MG PO TABS
325.0000 mg | ORAL_TABLET | Freq: Every day | ORAL | Status: DC
  Administered 2011-04-22 – 2011-04-26 (×4): 325 mg via ORAL
  Filled 2011-04-22 (×6): qty 1

## 2011-04-22 MED ORDER — AMITRIPTYLINE HCL 50 MG PO TABS
50.0000 mg | ORAL_TABLET | Freq: Every day | ORAL | Status: DC
  Administered 2011-04-22 – 2011-04-25 (×4): 50 mg via ORAL
  Filled 2011-04-22 (×7): qty 1

## 2011-04-22 MED ORDER — LISINOPRIL 20 MG PO TABS
20.0000 mg | ORAL_TABLET | Freq: Two times a day (BID) | ORAL | Status: DC
  Administered 2011-04-22 – 2011-04-24 (×5): 20 mg via ORAL
  Filled 2011-04-22 (×10): qty 1

## 2011-04-22 MED ORDER — METOPROLOL TARTRATE 50 MG PO TABS
75.0000 mg | ORAL_TABLET | Freq: Two times a day (BID) | ORAL | Status: DC
  Administered 2011-04-22 – 2011-04-26 (×7): 75 mg via ORAL
  Filled 2011-04-22 (×13): qty 1

## 2011-04-22 MED ORDER — CYCLOSPORINE 0.05 % OP EMUL
1.0000 [drp] | Freq: Two times a day (BID) | OPHTHALMIC | Status: DC
  Administered 2011-04-22 – 2011-04-26 (×8): 1 [drp] via OPHTHALMIC
  Filled 2011-04-22 (×12): qty 1

## 2011-04-22 MED ORDER — ENOXAPARIN SODIUM 30 MG/0.3ML ~~LOC~~ SOLN
30.0000 mg | SUBCUTANEOUS | Status: DC
  Administered 2011-04-22 – 2011-04-23 (×2): 30 mg via SUBCUTANEOUS
  Filled 2011-04-22 (×5): qty 0.3

## 2011-04-22 MED ORDER — INSULIN ASPART 100 UNIT/ML ~~LOC~~ SOLN
0.0000 [IU] | Freq: Three times a day (TID) | SUBCUTANEOUS | Status: DC
  Administered 2011-04-23 – 2011-04-24 (×3): 3 [IU] via SUBCUTANEOUS
  Administered 2011-04-25 (×2): 2 [IU] via SUBCUTANEOUS
  Administered 2011-04-26: 3 [IU] via SUBCUTANEOUS
  Administered 2011-04-26: 2 [IU] via SUBCUTANEOUS
  Filled 2011-04-22 (×3): qty 3

## 2011-04-22 MED ORDER — FERROUS SULFATE 325 (65 FE) MG PO TABS
325.0000 mg | ORAL_TABLET | Freq: Two times a day (BID) | ORAL | Status: DC
  Administered 2011-04-22 – 2011-04-26 (×7): 325 mg via ORAL
  Filled 2011-04-22 (×12): qty 1

## 2011-04-22 MED ORDER — HYDROMORPHONE HCL PF 1 MG/ML IJ SOLN
0.5000 mg | Freq: Once | INTRAMUSCULAR | Status: AC
  Administered 2011-04-22: 0.5 mg via INTRAVENOUS
  Filled 2011-04-22: qty 1

## 2011-04-22 MED ORDER — ACETAMINOPHEN 325 MG PO TABS
650.0000 mg | ORAL_TABLET | ORAL | Status: DC | PRN
  Administered 2011-04-23: 650 mg via ORAL
  Filled 2011-04-22 (×2): qty 2

## 2011-04-22 MED ORDER — GABAPENTIN 600 MG PO TABS
600.0000 mg | ORAL_TABLET | Freq: Four times a day (QID) | ORAL | Status: DC
  Filled 2011-04-22 (×2): qty 1

## 2011-04-22 NOTE — ED Provider Notes (Signed)
History     CSN: [id number] Arrival date & time: 04/22/2011  3:13 PM   First MD Initiated Contact with Patient 04/22/11 1518      No chief complaint on file.   (Consider location/radiation/quality/duration/timing/severity/associated sxs/prior treatment) HPI Comments: Pt has hx of prior stroke with aphasia and left sided weakness at baseline and sister states that for several seconds she had slurred speech today without other deficits.  Now resolved.  Also she has been having toe pain and drainage.  Denies fever or shortness of breath.  Patient is a 50 y.o. female presenting with neurologic complaint. The history is provided by the patient and a relative.  Neurologic Problem The primary symptoms include speech change. Primary symptoms do not include headaches, loss of consciousness, altered mental status, visual change, paresthesias, focal weakness, loss of sensation, nausea or vomiting. The symptoms began less than 1 hour ago. The episode lasted 5 seconds. The symptoms are resolved. The neurological symptoms are focal. Context: while getting into the car.  Features of the speech change include inability to articulate.  Additional symptoms do not include loss of balance. Medical issues also include cerebral vascular accident.    Past Medical History  Diagnosis Date  . HTN (hypertension)   . HLD (hyperlipidemia)   . History of methicillin resistant staphylococcus aureus (MRSA)   . Sleep apnea   . CVA (cerebral infarction)   . Anemia   . DVT (deep venous thrombosis)   . Obesity   . Renal failure     due to vanc toxicity  . Osteomyelitis   . Lower back pain     Chronic  . Idiopathic peripheral neuropathy     NOS  . DJD (degenerative joint disease)   . Headache   . GERD (gastroesophageal reflux disease)   . DM type 2 (diabetes mellitus, type 2)   . Depression   . Anxiety   . Hypokalemia     resolved  . Cerebral infarct     left parietal and bilateral  . Esophagitis    distal  . Fluid overload     compensated  . Urinary retention   . H/O: pneumonia   . History of bacteremia   . Surgery, other elective     of the ear    Past Surgical History  Procedure Date  . Tympanoplasty   . Toe amputation     left 2nd toe  . Dilation and curettage of uterus     history    Family History  Problem Relation Age of Onset  . Coronary artery disease    . Diabetes      History  Substance Use Topics  . Smoking status: Never Smoker   . Smokeless tobacco: Not on file  . Alcohol Use: No     socially    OB History    Grav Para Term Preterm Abortions TAB SAB Ect Mult Living                  Review of Systems  Gastrointestinal: Negative for nausea and vomiting.  Musculoskeletal:       Left great toe pain  Neurological: Positive for speech change. Negative for focal weakness, loss of consciousness, headaches, paresthesias and loss of balance.  Psychiatric/Behavioral: Negative for altered mental status.  All other systems reviewed and are negative.    Allergies  Vancomycin  Home Medications   Current Outpatient Rx  Name Route Sig Dispense Refill  . AMITRIPTYLINE HCL 50 MG PO TABS  Oral Take 50 mg by mouth at bedtime. For nerves/rest     . CYCLOSPORINE 0.05 % OP EMUL Both Eyes Place 1 drop into both eyes 2 (two) times daily.      Marland Kitchen FERROUS SULFATE 325 (65 FE) MG PO TABS Oral Take 325 mg by mouth 2 (two) times daily.      Marland Kitchen GABAPENTIN 600 MG PO TABS Oral Take 600 mg by mouth. 4 times per day     . HYDROCHLOROTHIAZIDE 12.5 MG PO CAPS Oral Take 12.5 mg by mouth daily.      Marland Kitchen LISINOPRIL 20 MG PO TABS Oral Take 20 mg by mouth daily. 1 tab po bid    . METFORMIN HCL 1000 MG PO TABS Oral Take 1,000 mg by mouth 2 (two) times daily with a meal.      . METOPROLOL TARTRATE 50 MG PO TABS Oral Take 50 mg by mouth 2 (two) times daily.      Marland Kitchen PRAVASTATIN SODIUM 40 MG PO TABS Oral Take 40 mg by mouth at bedtime.        BP 156/89  Pulse 117  Resp 16  SpO2  99%  Physical Exam  Nursing note and vitals reviewed. Constitutional: She is oriented to person, place, and time. She appears well-developed and well-nourished. No distress.  HENT:  Head: Normocephalic and atraumatic.  Eyes: EOM are normal. Pupils are equal, round, and reactive to light.  Neck: Normal range of motion. Neck supple.  Cardiovascular: Regular rhythm, normal heart sounds and intact distal pulses.  Tachycardia present.  Exam reveals no friction rub.   No murmur heard. Pulmonary/Chest: Effort normal and breath sounds normal. She has no wheezes. She has no rales.  Abdominal: Soft. Bowel sounds are normal. She exhibits no distension. There is no tenderness. There is no rebound and no guarding.  Musculoskeletal: Normal range of motion. She exhibits no tenderness.       Left foot: She exhibits tenderness.       Feet:       No edema  Neurological: She is alert and oriented to person, place, and time. She displays no tremor. GCS eye subscore is 4. GCS verbal subscore is 5. GCS motor subscore is 6.       4/5 strength in the left upper ext and mild left facial droop.  Normal eye movements and decreased sensation on the left compared to right.  Normal speech but aphasia  Skin: Skin is warm and dry. No rash noted.  Psychiatric: She has a normal mood and affect. Her behavior is normal.    ED Course  Procedures (including critical care time)  Labs Reviewed  GLUCOSE, CAPILLARY - Abnormal; Notable for the following:    Glucose-Capillary 234 (*)    All other components within normal limits  CBC  DIFFERENTIAL  COMPREHENSIVE METABOLIC PANEL  URINALYSIS, ROUTINE W REFLEX MICROSCOPIC  CARDIAC PANEL(CRET KIN+CKTOT+MB+TROPI)   Dg Chest 2 View  04/22/2011  *RADIOLOGY REPORT*  Clinical Data: Tachycardia  CHEST - 2 VIEW  Comparison: 06/19/2010  Findings: Cardiomegaly with pulmonary vascular congestion and suspected mild interstitial edema. No pleural effusion or pneumothorax.  Degenerative  changes of the visualized thoracolumbar spine.  IMPRESSION: Cardiomegaly with pulmonary vascular congestion and suspected mild interstitial edema.  Original Report Authenticated By: Julian Hy, M.D.   Ct Head Wo Contrast  04/22/2011  *RADIOLOGY REPORT*  Clinical Data: Slurred speech, left-sided weakness  CT HEAD WITHOUT CONTRAST  Technique:  Contiguous axial images were obtained from the base  of the skull through the vertex without contrast.  Comparison: Head MRI 06/13/2010  Findings: There is a cortical infarction in the high left frontal and parietal lobe which corresponds to prior infarction demonstrated on MRI 06/13/2010.  There is no CT evidence of acute infarction.  No intracranial hemorrhage.  No focal mass lesion.  No midline shift or mass effect.  No hydrocephalus.  Paranasal sinuses and mastoid air cells are clear.  Orbits are normal.  IMPRESSION:  1.  No acute intracranial findings. 2.  Remote left frontoparietal infarction.  Original Report Authenticated By: Suzy Bouchard, M.D.   Dg Foot Complete Left  04/22/2011  *RADIOLOGY REPORT*  Clinical Data: Great toe wound, evaluate for osteomyelitis  LEFT FOOT - COMPLETE 3+ VIEW  Comparison: MRI dated 06/27/2008  Findings: Soft tissue injury along the medial aspect of the first distal phalanx.  No definite underlying cortical irregularity to suggest osteomyelitis.  Prior resection of the second ray.  No fracture or dislocation is seen.  Plantar and posterior calcaneal enthesopathy.  Vascular calcifications.  IMPRESSION: Soft tissue injury along the medial aspect of the first distal phalanx.  No radiographic evidence of osteomyelitis.  Original Report Authenticated By: Julian Hy, M.D.    Date: 04/23/2011  Rate: 116  Rhythm: sinus tachycardia  QRS Axis: normal  Intervals: normal  ST/T Wave abnormalities: nonspecific T wave changes, left ventricular hypertrophy  Conduction Disutrbances:none  Narrative Interpretation:   Old EKG  Reviewed: unchanged     1. Tachycardia   2. Anemia   3. TIA (transient ischemic attack)   4. Diabetic foot ulcer   5. CVA       MDM   Pt with hx of left sided stroke in the past and today family states slurred speech for 2 seconds and now say her speech is normal.  However pt was at the ED yesterday for foot pain and infection and pt has hx of DM.  Sounds like possible TIA today but other concern is for osteo of the left great toe and pt is tachy here in the 115's.  Will check temp, CBC, CMP, CE, EKG, CXR and head CT and film of the foot.   Currently pt is back to baseline so not a code stroke at this time.   Labs showed worsening anemia and 1 unit of PRBC's ordered due to persistent tachycardia. Spoke with [facility] and neuro about TIA work up and obs.  Also no signs of osteo from the toe.      Blanchie Dessert, MD 04/23/11 [phone]

## 2011-04-22 NOTE — H&P (Signed)
Hiya Boers MRN: [id number] DOB/AGE: 1960-07-10 50 y.o. Primary Care Physician:FRY,STEPHEN A, MD Admit date: 04/22/2011 Chief Complaint: Slurred speech HPI:  Abigailrose Natalie is a 50 year old female with history of hypertension tachycardia hyperlipidemia prior history of CVA in December of 2011 with residual aphasia and left hemiplegia  who presents to the ED with slurred speech. Per patient's sister, and her the way out today as patient was trying to get in the car she developed slurred speech, slight left facial droop. She denies any fever, no nausea, no emesis,no dysuria, no weakness, no cough. Patient endorses some SOB, and an episode of chest pain 1 day prior to admission. Pt also endorses left great toe wound. Patient was seen in ED and head CT done was negative. CBC with Hgb 8.6, HR 115, EKG with HR 115. We were called to admit for further eval and rxcs. Neurology was consulted and will see the patient.  Past Medical History  Diagnosis Date  . HTN (hypertension)   . HLD (hyperlipidemia)   . History of methicillin resistant staphylococcus aureus (MRSA)   . Sleep apnea   . CVA (cerebral infarction)   . Anemia   . Tachycardia   . Obesity   . Renal failure     due to vanc toxicity  . Osteomyelitis   . Lower back pain     Chronic  . Idiopathic peripheral neuropathy     NOS  . DJD (degenerative joint disease)   . Headache   . GERD (gastroesophageal reflux disease)   . DM type 2 (diabetes mellitus, type 2)   . Depression   . Anxiety   . Hypokalemia     resolved  . Cerebral infarct     left parietal and bilateral  . Esophagitis     distal  . Fluid overload     compensated  . Urinary retention   . H/O: pneumonia   . History of bacteremia   . Surgery, other elective     of the ear    Past Surgical History  Procedure Date  . Tympanoplasty   . Toe amputation     left 2nd toe  . Dilation and curettage of uterus     history    Prior to Admission medications   Medication  Sig Start Date End Date Taking? Authorizing Provider  amitriptyline (ELAVIL) 50 MG tablet Take 50 mg by mouth at bedtime. For nerves/rest    Yes Historical Provider, MD  cycloSPORINE (RESTASIS) 0.05 % ophthalmic emulsion Place 1 drop into both eyes 2 (two) times daily.     Yes Historical Provider, MD  ferrous sulfate 325 (65 FE) MG tablet Take 325 mg by mouth 2 (two) times daily.     Yes Historical Provider, MD  gabapentin (NEURONTIN) 600 MG tablet Take 600 mg by mouth. 4 times per day  09/11/10  Yes Josue Hector, MD  hydrochlorothiazide (,MICROZIDE/HYDRODIURIL,) 12.5 MG capsule Take 12.5 mg by mouth daily.    Yes Historical Provider, MD  lisinopril (PRINIVIL,ZESTRIL) 20 MG tablet Take 20 mg by mouth 2 (two) times daily.    Yes Historical Provider, MD  metFORMIN (GLUCOPHAGE) 1000 MG tablet Take 1,000 mg by mouth 2 (two) times daily.    Yes Historical Provider, MD  metoprolol (LOPRESSOR) 50 MG tablet Take 50 mg by mouth 2 (two) times daily.    Yes Historical Provider, MD  pravastatin (PRAVACHOL) 40 MG tablet Take 40 mg by mouth at bedtime.     Yes Historical Provider, MD  Allergies:  Allergies  Allergen Reactions  . Vancomycin Other (See Comments)    Unknown     Family History  Problem Relation Age of Onset  . Coronary artery disease    . Diabetes      Social History:  reports that she has never smoked. She does not have any smokeless tobacco history on file. She reports that she does not drink alcohol or use illicit drugs.  ROS: All systems reviewed with the patient and was positive as per HPI otherwise all other systems are negative.  PHYSICAL EXAM: Blood pressure 154/89, pulse 118, temperature 98.7 F (37.1 C), temperature source Oral, resp. rate 15, SpO2 93.00%. General: Alert, awake, oriented x3, in no acute distress. HEENT: No bruits, no goiter. Heart: Regular rate and rhythm, without murmurs, rubs, gallops. Lungs: Clear to auscultation bilaterally. Abdomen: Soft,  nontender, nondistended, positive bowel sounds. Extremities: No clubbing cyanosis or edema with positive pedal pulses. Neuro: Patient with slight left facial droop otherwise rest of CN exam within normal limits. Sensation is intact. 5/5 RUE and RLE strength. 3-4/5 LUE and LLE strength. Unable to elicit reflexes diffusely.   EKG: Sinus tachycardia.  No results found for this or any previous visit (from the past 240 hour(s)).   Lab results:  [facility] 04/22/11 1545  NA 136  K 4.5  CL 104  CO2 25  GLUCOSE 273*  BUN 24*  CREATININE 1.44*  CALCIUM 9.0  MG --  PHOS --    Basename 04/22/11 1545  AST 10  ALT 7  ALKPHOS 84  BILITOT 0.2*  PROT 6.5  ALBUMIN 2.2*   No results found for this basename: LIPASE:2,AMYLASE:2 in the last 72 hours  Basename 04/22/11 1545  WBC 7.0  NEUTROABS 4.5  HGB 8.6*  HCT 28.3*  MCV 86.0  PLT 412*    Basename 04/22/11 1545  CKTOTAL 95  CKMB 4.0  CKMBINDEX --  TROPONINI <0.30   No results found for this basename: POCBNP:3 in the last 72 hours No results found for this basename: DDIMER in the last 72 hours No results found for this basename: HGBA1C:2 in the last 72 hours No results found for this basename: CHOL:2,HDL:2,LDLCALC:2,TRIG:2,CHOLHDL:2,LDLDIRECT:2 in the last 72 hours No results found for this basename: TSH,T4TOTAL,FREET3,T3FREE,THYROIDAB in the last 72 hours No results found for this basename: VITAMINB12:2,FOLATE:2,FERRITIN:2,TIBC:2,IRON:2,RETICCTPCT:2 in the last 72 hours Imaging results:  Dg Chest 2 View  04/22/2011  *RADIOLOGY REPORT*  Clinical Data: Tachycardia  CHEST - 2 VIEW  Comparison: 06/19/2010  Findings: Cardiomegaly with pulmonary vascular congestion and suspected mild interstitial edema. No pleural effusion or pneumothorax.  Degenerative changes of the visualized thoracolumbar spine.  IMPRESSION: Cardiomegaly with pulmonary vascular congestion and suspected mild interstitial edema.  Original Report Authenticated By:  Julian Hy, M.D.   Ct Head Wo Contrast  04/22/2011  *RADIOLOGY REPORT*  Clinical Data: Slurred speech, left-sided weakness  CT HEAD WITHOUT CONTRAST  Technique:  Contiguous axial images were obtained from the base of the skull through the vertex without contrast.  Comparison: Head MRI 06/13/2010  Findings: There is a cortical infarction in the high left frontal and parietal lobe which corresponds to prior infarction demonstrated on MRI 06/13/2010.  There is no CT evidence of acute infarction.  No intracranial hemorrhage.  No focal mass lesion.  No midline shift or mass effect.  No hydrocephalus.  Paranasal sinuses and mastoid air cells are clear.  Orbits are normal.  IMPRESSION:  1.  No acute intracranial findings. 2.  Remote left frontoparietal  infarction.  Original Report Authenticated By: Suzy Bouchard, M.D.   Dg Foot Complete Left  04/22/2011  *RADIOLOGY REPORT*  Clinical Data: Great toe wound, evaluate for osteomyelitis  LEFT FOOT - COMPLETE 3+ VIEW  Comparison: MRI dated 06/27/2008  Findings: Soft tissue injury along the medial aspect of the first distal phalanx.  No definite underlying cortical irregularity to suggest osteomyelitis.  Prior resection of the second ray.  No fracture or dislocation is seen.  Plantar and posterior calcaneal enthesopathy.  Vascular calcifications.  IMPRESSION: Soft tissue injury along the medial aspect of the first distal phalanx.  No radiographic evidence of osteomyelitis.  Original Report Authenticated By: Julian Hy, M.D.   Impression/Plan:  Principal Problem:  1.TIA (transient ischemic attack) - Patient with prior stroke, DM, HTN, hyperlipidemia. Will admit to r/o CVA. Will check, MRI, 2 d echo, carotid dopplers, cycle cardiac enzymes, PT/OT, place on ASA, risk factor modification. Neurology consult pending. Follow 2. Tachycardia - chronic and worked up extensively by her cardiologist. Titrate beta blocker. 3. Iron deficiency anemia - H/H stable.  Transfusion threshhold Hgb < 7. 4. DM II - check Hgb A1C. Hold oral hypoglycemics. SSI 5. L great toe ulcer - wound care consult. 6. Hyperlipidemia - check FLP. Continue statin. 7. HTN - Continue home regimen. 8. Depression - Continue elavil 9. Prophylaxis- lovenox for DVT.     Tyera Hansley 04/22/2011, 7:10 PM

## 2011-04-22 NOTE — ED Notes (Signed)
Admitting MD at bedside.

## 2011-04-22 NOTE — Consult Note (Signed)
NEUROLOGY STROKE/TIA CONSULT NOTE  Referring Provider: Blanchie Dessert, MD  HPI:  [name] is a 50 y.o. female with history of obesity, DM, HTN, HLD, and previous left temporoparietal infarct in 1/12 and baseline neuro deficits of mild expressive aphasia, left sided weakness, right gaze deviation, and left neglect who presents to the [facility] ED with onset of slurred speech. She lives with her sister who is her caregiver and the primary history today. Her sister states she was at her baseline this morning. At about 2pm, she was in their kitchen when her sister noticed she seemed "clumsy" and was dropping items. Her sister asked her what the problem was and she responded that she was fine. They walked downstairs to get in their car. At that time, her sister noted that she was slurring her words. She felt the patient was talking more out of the right side of her mouth and was drooling slightly. Her language was at her baseline during this. She did not notice any focal neuro deficits or facial droop. The patient states that she did not realize anything was wrong. Her sister brought her to the ED where a CT head was performed with no acute changes. Initial lab work does not suggest infection. By the time she arrived here, her sister felt she was back to her baseline. Patient reports recent chest pains that she associates with anxiety since yesterday. She was seen in the ED yesterday for a left great toe infection. She complained of chest pain at that time and had an EKG performed which was unremarkable. Since her stroke last year, she has not had any further episodes of neuro changes. At baseline, she lives with her sister but performs her own ADLs. She occasionally walks with a walker but usually walks unassisted.   Past Medical History  Diagnosis Date  . HTN (hypertension)   . HLD (hyperlipidemia)   . History of methicillin resistant staphylococcus aureus (MRSA)   . Sleep apnea   . CVA  (cerebral infarction)   . Anemia   . Tachycardia   . Obesity   . Renal failure     due to vanc toxicity  . Osteomyelitis   . Lower back pain     Chronic  . Idiopathic peripheral neuropathy     NOS  . DJD (degenerative joint disease)   . Headache   . GERD (gastroesophageal reflux disease)   . DM type 2 (diabetes mellitus, type 2)   . Depression   . Anxiety   . Hypokalemia     resolved  . Cerebral infarct     left parietal and bilateral  . Esophagitis     distal  . Fluid overload     compensated  . Urinary retention   . H/O: pneumonia   . History of bacteremia   . Surgery, other elective     of the ear  . Stroke     Hx of left frontoparietal stroke in 12/11. History of right brain stroke.    Past Surgical History  Procedure Date  . Tympanoplasty   . Toe amputation     left 2nd toe  . Dilation and curettage of uterus     history   Allergies  Allergen Reactions  . Vancomycin Other (See Comments)    Unknown    Prescriptions prior to admission  Medication Sig Dispense Refill  . amitriptyline (ELAVIL) 50 MG tablet Take 50 mg by mouth at bedtime. For nerves/rest       .  cycloSPORINE (RESTASIS) 0.05 % ophthalmic emulsion Place 1 drop into both eyes 2 (two) times daily.        . ferrous sulfate 325 (65 FE) MG tablet Take 325 mg by mouth 2 (two) times daily.        Marland Kitchen gabapentin (NEURONTIN) 600 MG tablet Take 600 mg by mouth. 4 times per day       . hydrochlorothiazide (,MICROZIDE/HYDRODIURIL,) 12.5 MG capsule Take 12.5 mg by mouth daily.       Marland Kitchen lisinopril (PRINIVIL,ZESTRIL) 20 MG tablet Take 20 mg by mouth 2 (two) times daily.       . metFORMIN (GLUCOPHAGE) 1000 MG tablet Take 1,000 mg by mouth 2 (two) times daily.       . metoprolol (LOPRESSOR) 50 MG tablet Take 50 mg by mouth 2 (two) times daily.       . pravastatin (PRAVACHOL) 40 MG tablet Take 40 mg by mouth at bedtime.         Family History  Problem Relation Age of Onset  . Coronary artery disease    .  Diabetes     Social History: Patient drinks rarely on holidays. She has never used tobacco. She lives with her sister and is on permanent disability.  Review of Systems:   A ten system review of systems was obtained and was negative except as stated above.   Physical Exam: BP 154/89  Pulse 118  Temp(Src) 98.7 F (37.1 C) (Oral)  Resp 15  SpO2 93% GENERAL:   Well nourished, well hydrated, no acute distress. Obese.  CARDIOVASCULAR:   Regular rate and rhythm, no thrills or palpable murmurs, S1, S2, no murmur, no rubs or gallops.      Carotid arteries: No carotid bruits.   RESPIRATORY:  Mild end-expiratory wheezing.  EXTREMITIES:  Left great toenail is ingrown with some skin breakdown.   MENTAL STATUS EXAM:    Orientation:  Alert and oriented to person, place and time.      Memory:  Cooperative, follows commands well.  Recent and remote memory normal.      Attention, concentration:  Attention span and concentration are normal.      Language:  Speech is slightly slurred. Some word finding difficulty (baseline per sister).     CRANIAL NERVES:     CN 2 (Optic):  Some restriction of left bilateral hemifield (baseline), funduscopic examination without optic disk pallor or edema.     CN 3,4,6 (EOM):  Pupils equal and reactive to light. Right gaze deviation; does not cross midline (baseline).     CN 5 (Trigeminal):  Facial sensation is normal, no weakness of masticatory muscles.      CN 7 (Facial):  Left lower facial weakness (baseline).     CN 8 (Auditory):  Auditory acuity grossly normal.      CN 9,10 (Glossophar):  The uvula is midline, the palate elevates symmetrically.      CN 11 (spinal access):  Normal sternocleidomastoid and trapezius strength.      CN 12 (Hypoglossal):  The tongue is midline. No atrophy or fasciculations.   MOTOR:    Deltoids:            (R): 5  (L): 5-      Biceps:                       (R): 5  (L): 5-      Triceps:                       (  R): 5  (L): 4+           Wrist Extensors:         (R): 5  (L): 5      Wrist Flexors:      (R): 5  (L): 5      Hip Flexors:                       (R): 5  (L): 5      Quadriceps:                       (R): 5  (L): 5      Hamstrings:                       (R): 5  (L): 5-      Tibialis Anterior:                 (R): 5  (L): 5-      Medial Gastrocnemius:     (R): 5  (L): 5  REFLEXES:     Triceps:                 (R): 2+  (L): 2+      Biceps:                  (R): 2+  (L): 2+      Brachioradialis:     (R): 2+  (L): 2+      Patellar:                 (R): 1+  (L): 1+      Achilles:                 (R): 1+  (L): 1+      Hoffman:    (R): absent  (L): absent      Babinski:    (R): absent  (L): absent   COORDINATION:     Intact finger-to-nose. Difficulty following with HTS.  SENSATION:     Distal vibratory loss in both legs in a length-dependent pattern. Simultaneous neglect to left lower extremity.  GAIT:     Deferred due to fall risk.  NIHSS Score: 7- this appears to be her baseline deficits Diagnostic Studies:   1) CT head noncontrast 11/11: no acute abnormality 2) MRA brain without 06/12/10: Stenosis or thrombus at the left MCA bifurcation effecting the posterior most left MCA sylvian division with decreased flow and likely more distal occlusion of the branch. This is congruent with the MRI findings. No other major circle of Willis branch stenosis or occlusion. Cavernous ICA and posterior circulation atherosclerosis. 3) MRI brain without 06/12/10: Confluent left parietal and posterior temporal lobe infarct as seen on earlier CT. Multi focal additional small/lacunar infarcts in the bilateral cerebral hemispheres. Pattern could reflect either cardiac or aortic embolic phenomena versus hypotensive and watershed infarcts. No significant mass effect or hemorrhage. Underlying chronic small vessel ischemia 4) TEE 06/14/10: No embolus and negative bubble studies with severe LVH with EF of 60%.     Assessment:  50y/o RH AAW with  history of previous left temporoparietal infarct presenting with an episode of dysarthria today with clumsiness resolving to baseline. Her current deficits per her sister are at her baseline; however, her left sided weakness and neglect are not well explained by her previous stroke in 1/12 and were not mentioned on her discharge  summary at that time. Her event today could be consistent with a lacunar infarct or TIA syndrome such as clumsy hand-dysarthria syndrome. Given her renal problems, she is not current a good candidate for contrasted studies.  Plan: -Agree with plan for admission on telemetry for cardiac monitoring. -Recommend MRI brain without contrast and MRA head and neck without contrast. -Recommend TCD/CUS and TTE. No bubble study necessary as this was checked previously. -Risk stratify with lipid profile, HgbA1c, and TSH. -Current SBP in the 150s. Recommend aiming for SBPs of 140-160 at this time. -If patient can tolerate, recommend aspirin at 325mg  daily for antiplatelet therapy. -Given her recent complaints of chest pain, agree with plan to rule out with cardiac enzymes x3.  Thank you for this consultation. The neurology stroke consult team will follow up tomorrow. Please page me with any further questions if needed.  Caffie Damme [facility] Pager [phone]

## 2011-04-22 NOTE — ED Notes (Signed)
Gave report to RN on 4 [place]. Will transport pt vis stretcher to room.

## 2011-04-23 ENCOUNTER — Inpatient Hospital Stay (HOSPITAL_COMMUNITY): Payer: Medicaid Other

## 2011-04-23 DIAGNOSIS — I517 Cardiomegaly: Secondary | ICD-10-CM

## 2011-04-23 DIAGNOSIS — G459 Transient cerebral ischemic attack, unspecified: Secondary | ICD-10-CM

## 2011-04-23 LAB — CBC
MCHC: 30.7 g/dL (ref 30.0–36.0)
Platelets: 378 10*3/uL (ref 150–400)
RDW: 15.4 % (ref 11.5–15.5)

## 2011-04-23 LAB — GLUCOSE, CAPILLARY
Glucose-Capillary: 171 mg/dL — ABNORMAL HIGH (ref 70–99)
Glucose-Capillary: 182 mg/dL — ABNORMAL HIGH (ref 70–99)

## 2011-04-23 LAB — CARDIAC PANEL(CRET KIN+CKTOT+MB+TROPI)
CK, MB: 3.1 ng/mL (ref 0.3–4.0)
Relative Index: INVALID (ref 0.0–2.5)
Total CK: 69 U/L (ref 7–177)
Total CK: 63 U/L (ref 7–177)
Troponin I: <0.3 ng/mL (ref <0.30)

## 2011-04-23 LAB — LIPID PANEL
LDL Cholesterol: 158 mg/dL — ABNORMAL HIGH (ref 0–99)
Triglycerides: 116 mg/dL (ref <150)

## 2011-04-23 LAB — BASIC METABOLIC PANEL
GFR calc Af Amer: 49 mL/min — ABNORMAL LOW (ref >90)
GFR calc non Af Amer: 42 mL/min — ABNORMAL LOW (ref >90)
Potassium: 4.8 mEq/L (ref 3.5–5.1)
Sodium: 134 mEq/L — ABNORMAL LOW (ref 135–145)

## 2011-04-23 LAB — RAPID URINE DRUG SCREEN, HOSP PERFORMED
Amphetamines: NOT DETECTED
Benzodiazepines: NOT DETECTED

## 2011-04-23 LAB — HEMOGLOBIN A1C: Hgb A1c MFr Bld: 8.4 % — ABNORMAL HIGH (ref <5.7)

## 2011-04-23 LAB — TSH: TSH: 4.099 u[IU]/mL (ref 0.350–4.500)

## 2011-04-23 NOTE — Progress Notes (Signed)
  Echocardiogram 2D Echocardiogram has been performed.  [name] [name] 04/23/2011, 3:33 PM

## 2011-04-23 NOTE — Consult Note (Signed)
WOC consult Note Reason for Consult: left great toe Wound type: etiology not known; ischemic v. Infectious.  Area beneath long toenail has what appears to be a keratotic growth.  Family states that this area drained blood and pus over the weekend. Measurement:1cm x 1cm area Drainage (amount, consistency, odor): None at this time or on old dressing. Periwound: discoloration measuring 1.5cm x .4cm at medial aspect of left great toe. Dressing procedure/placement/frequency: *Recommend podiatry consult for nail debridement when medically able/available*  In the meantime, I have ordered once daily swabbing with an antiseptic swab (povodine iodine) and air dry.  Dress with dry gauze and change daily. Patient transferring to [facility] this pm. I will not follow.  Please re-consult if needed. Thanks, Maudie Flakes, MSN, RN, [facility] [place] [phone])

## 2011-04-23 NOTE — Progress Notes (Signed)
Subjective: "My feet hurt and i am thirsty, but otherwise ok".  Objective: Vital signs Filed Vitals:   04/22/11 1602 04/22/11 1853 04/22/11 2000 04/23/11 0445  BP: 155/79 154/89 158/92 112/71  Pulse: 115 118 120 63  Temp: 98.8 F (37.1 C) 98.7 F (37.1 C) 99.1 F (37.3 C) 97.5 F (36.4 C)  TempSrc: Oral Oral Oral Oral  Resp: 21 15 14 20   Height:   5\' 7"  (1.702 m)   Weight:   121 kg (266 lb 12.1 oz)   SpO2: 96% 93% 92% 99%   Weight change:  Last BM Date: 04/21/11  Intake/Output from previous day: 11/11 0701 - 11/12 0700 In: 420 [P.O.:120; I.V.:300] Out: 0  Total I/O In: 660 [P.O.:60; I.V.:600] Out: 1050 [Urine:1050]   Physical Exam: General: Alert, awake, oriented x3, in no acute distress. HEENT: No bruits, no goiter. Heart: Regular rate and rhythm, without murmurs, rubs, gallops. Lungs: Clear to auscultation bilaterally. Normal effort. No wheeze, rhonchi rales Abdomen: Soft, nontender, nondistended, positive bowel sounds. Obese Extremities: No clubbing cyanosis or edema with positive pedal pulses.  Neuro: Left facial droop. RLE strength 2/5 RUE strength 2/5. Speech slurred.     Lab Results: Basic Metabolic Panel:  Basename 04/23/11 0359 04/22/11 1545  NA 134* 136  K 4.8 4.5  CL 103 104  CO2 25 25  GLUCOSE 205* 273*  BUN 24* 24*  CREATININE 1.44* 1.44*  CALCIUM 8.9 9.0  MG -- --  PHOS -- --   Liver Function Tests:  Basename 04/22/11 1545  AST 10  ALT 7  ALKPHOS 84  BILITOT 0.2*  PROT 6.5  ALBUMIN 2.2*   No results found for this basename: LIPASE:2,AMYLASE:2 in the last 72 hours No results found for this basename: AMMONIA:2 in the last 72 hours CBC:  Basename 04/23/11 0359 04/22/11 1545  WBC 6.9 7.0  NEUTROABS -- 4.5  HGB 8.4* 8.6*  HCT 27.4* 28.3*  MCV 86.2 86.0  PLT 378 412*   Cardiac Enzymes:  Basename 04/23/11 0359 04/22/11 2017 04/22/11 1545  CKTOTAL 69 94 95  CKMB 2.9 3.7 4.0  CKMBINDEX -- -- --  TROPONINI <0.30 <0.30 <0.30    BNP: No results found for this basename: POCBNP:3 in the last 72 hours D-Dimer: No results found for this basename: DDIMER:2 in the last 72 hours CBG:  Basename 04/23/11 0441 04/23/11 0036 04/22/11 2031 04/22/11 1518  GLUCAP 194* 171* 174* 234*   Hemoglobin A1C:  Basename 04/22/11 2017  HGBA1C 8.4*   Fasting Lipid Panel:  Basename 04/23/11 0359  CHOL 230*  HDL 49  LDLCALC 158*  TRIG 116  CHOLHDL 4.7  LDLDIRECT --   Thyroid Function Tests:  Basename 04/22/11 2017  TSH 4.099  T4TOTAL --  FREET4 --  T3FREE --  THYROIDAB --   Anemia Panel: No results found for this basename: VITAMINB12,FOLATE,FERRITIN,TIBC,IRON,RETICCTPCT in the last 72 hours Coagulation:  Basename 04/22/11 1725  LABPROT 14.8  INR 1.14   Urine Drug Screen:  Alcohol Level: No results found for this basename: ETH:2 in the last 72 hours Urinalysis:  Misc. Labs:  Recent Results (from the past 240 hour(s))  MRSA PCR SCREENING     Status: Normal   Collection Time   04/22/11 10:29 PM      Component Value Range Status Comment   MRSA by PCR NEGATIVE  NEGATIVE  Final     Studies/Results: Dg Chest 2 View  04/22/2011  *RADIOLOGY REPORT*  Clinical Data: Tachycardia  CHEST - 2 VIEW  Comparison: 06/19/2010  Findings: Cardiomegaly with pulmonary vascular congestion and suspected mild interstitial edema. No pleural effusion or pneumothorax.  Degenerative changes of the visualized thoracolumbar spine.  IMPRESSION: Cardiomegaly with pulmonary vascular congestion and suspected mild interstitial edema.  Original Report Authenticated By: Julian Hy, M.D.   Ct Head Wo Contrast  04/22/2011  *RADIOLOGY REPORT*  Clinical Data: Slurred speech, left-sided weakness  CT HEAD WITHOUT CONTRAST  Technique:  Contiguous axial images were obtained from the base of the skull through the vertex without contrast.  Comparison: Head MRI 06/13/2010  Findings: There is a cortical infarction in the high left frontal and  parietal lobe which corresponds to prior infarction demonstrated on MRI 06/13/2010.  There is no CT evidence of acute infarction.  No intracranial hemorrhage.  No focal mass lesion.  No midline shift or mass effect.  No hydrocephalus.  Paranasal sinuses and mastoid air cells are clear.  Orbits are normal.  IMPRESSION:  1.  No acute intracranial findings. 2.  Remote left frontoparietal infarction.  Original Report Authenticated By: Suzy Bouchard, M.D.   Dg Foot Complete Left  04/22/2011  *RADIOLOGY REPORT*  Clinical Data: Great toe wound, evaluate for osteomyelitis  LEFT FOOT - COMPLETE 3+ VIEW  Comparison: MRI dated 06/27/2008  Findings: Soft tissue injury along the medial aspect of the first distal phalanx.  No definite underlying cortical irregularity to suggest osteomyelitis.  Prior resection of the second ray.  No fracture or dislocation is seen.  Plantar and posterior calcaneal enthesopathy.  Vascular calcifications.  IMPRESSION: Soft tissue injury along the medial aspect of the first distal phalanx.  No radiographic evidence of osteomyelitis.  Original Report Authenticated By: Julian Hy, M.D.    Medications: Scheduled Meds:   . amitriptyline  50 mg Oral QHS  . aspirin  325 mg Oral Daily  . cycloSPORINE  1 drop Both Eyes BID  . enoxaparin  30 mg Subcutaneous Q24H  . ferrous sulfate  325 mg Oral BID  . gabapentin  600 mg Oral Q6H  . hydrochlorothiazide  12.5 mg Oral Daily  .  HYDROmorphone (DILAUDID) injection  0.5 mg Intravenous Once  . insulin aspart  0-15 Units Subcutaneous TID WC  . lisinopril  20 mg Oral BID  . metoprolol  75 mg Oral BID  . simvastatin  20 mg Oral q1800  . DISCONTD: gabapentin  600 mg Oral Q6H   Continuous Infusions:   . sodium chloride 1,000 mL (04/23/11 0808)   PRN Meds:.acetaminophen  Assessment/Plan:  Principal Problem: 1. *TIA (transient ischemic attack): hx stroke, DM, HTN. MRI pending. Symptoms seem to be worsening per Neuro. MRI pending. CE  neg to date. Carotid dopp pend. Cont ASA. Appreciate neuro assistance.  2. Tachycardia: chronic. Current range 90-110. On BB 3. IDA. Hg stable. FOBT neg. Will monitor and consider transfusion if Hg <7 4. DM 2: uncontrolled. HG AIC 8.4. Holding home oral agents for now. Cont SSI 5. L great toe ulcer: Wound consult 6. HTN: controlled. Continue home meds 7. Hyperlipidemia: continue statin 8. Depression: stable. Continue elavil. 9. Idiopath peripheral neuropathy: baseline. Cont neurontin 10. Acute/chronic renal failure prob due to vanc use and decreased po intake. Chart review indicates baseline creat 1.2-1.3. Currently slightly above base. Will gently hydrate and hold any  Nephrotoxins. Monitor 11. Mild hyponatremia. Iv fluids. monitor    LOS: 1 day   [facility] M 04/23/2011, 8:45 AM  Attending: I have seen and assessed patient and agree with Dyanne Carrel, NP assessment and plan. Patient condition  has worsened, with flaccid LUE, and decreased strength in LLE and Left neglect. MRI pending. Will transfer to [facility] so stroke MD can follow.

## 2011-04-23 NOTE — Plan of Care (Signed)
Problem: Phase II Progression Outcomes Goal: Able to communicate Outcome: Progressing Needs substantial time to understand

## 2011-04-23 NOTE — Progress Notes (Signed)
Speech Language/Pathology Clinical/Bedside Swallow Evaluation Patient Details  Name: [name] MRN: [id number] DOB: 03-16-1961 Today's Date: 04/23/2011 1020-1105  Past Medical History:  Past Medical History  Diagnosis Date  . HTN (hypertension)   . HLD (hyperlipidemia)   . History of methicillin resistant staphylococcus aureus (MRSA)   . Sleep apnea   . CVA (cerebral infarction)   . Anemia   . Tachycardia   . Obesity   . Renal failure     due to vanc toxicity  . Osteomyelitis   . Lower back pain     Chronic  . Idiopathic peripheral neuropathy     NOS  . DJD (degenerative joint disease)   . Headache   . GERD (gastroesophageal reflux disease)   . DM type 2 (diabetes mellitus, type 2)   . Depression   . Anxiety   . Hypokalemia     resolved  . Cerebral infarct     left parietal and bilateral  . Esophagitis     distal  . Fluid overload     compensated  . Urinary retention   . H/O: pneumonia   . History of bacteremia   . Surgery, other elective     of the ear  . Stroke     Hx of left frontoparietal stroke in 12/11. History of right brain stroke.   Past Surgical History:  Past Surgical History  Procedure Date  . Tympanoplasty   . Toe amputation     left 2nd toe  . Dilation and curettage of uterus     history    Assessment/Recommendations/Treatment Plan  SLP Assessment Clinical Impression Statement: Pt presents with clinical symptoms of oral more than pharyngeal dysphagia.  Overt sign of aspiration with chicken noodle soup noted as pt orally pocketed (left) large amount of noodles with suspected aspiration of noodles likely prematurely spilling in pharynx.  No clinical signs of aspiration with puree and thin liquids.  Due to signficant oral sensorimotor deficits resulting in severe oral stasis (left), recommend modify diet with strict adherence to precautions.  Pt will require full supervision as her sisters state she eats quickly.  If pt demonstrates clinical  symptoms of aspiration at bedside, she may benefit from FEES to instrumentally evaluate swallow.      Risk for Aspiration: Moderate Other Related Risk Factors: History of pneumonia;History of dysphagia;History of GERD;Decreased respiratory status;Previous CVA  Recommendations Recommended Consults: OT self-feeding, FEES if pt does not appear to tolerate diet) Solid Consistency: Dysphagia 1 (Puree)  Extra gravy and sauce Liquid Consistency: Thin Liquid Administration via: Cup Medication Administration: Whole meds with puree (crush if not tolerated and not contraindicated) Supervision: Full supervision/cueing for compensatory strategies (pt to feed self with full supervision)  Compensations: Slow rate;Small sips/bites;Check for pocketing;Check for anterior loss (consume liquids (prefer water) throughout meal) Postural Changes and/or Swallow Maneuvers: Upright 30-60 min after meal;Seated upright 90 degrees Oral Care Recommendations: Oral care BID Other Recommendations: Clarify dietary restrictions  Prognosis Prognosis for Safe Diet Advancement: Guarded Barriers to Reach Goals: Behavior Barriers/Prognosis Comment: pt appears impulsive, which could increase her aspiration risk   Swallow Study Goals  SLP Swallowing Goals Patient will consume recommended diet without observed clinical signs of aspiration with: Moderate cueing;Moderate assistance Patient will utilize recommended strategies during swallow to increase swallowing safety with: Moderate cueing;Moderate assistance  Swallow Study Prior Functional Status  Cognitive/Linguistic Baseline: Baseline deficits, baseline aphasia from CVA 12/11  General  Date of Onset: 04/21/11 Other Pertinent Information: Pt is a 50 year old  female admitted to [facility] with left facial droop and slurred speech.  Pt has history of left hemiparesis and aphasia from previous left tempoparietal CVA 12/11. Pt seen by neuro who documented pt with right gaze  preference, left facial droop.  CXR 11/11 cardiomegaly with congestion.  CT Head negative, remote left tempoparietal CVA.  Pt for MRI today secondary to evolving symptoms.  Pt reports choking on foods since Saturday and sensation of stastis pointing to right side of pharynx.  Taking small bites, sips and following solids with liquids prevents cough per pt and family members.      Macario Golds 04/23/2011,11:51 AM

## 2011-04-23 NOTE — Progress Notes (Signed)
Subjective: Patient continues to have slurred speech and left facial droop.  Over night it seems her left arm has become progressively weaker.  MRI, 2D echo, Carotid Doppler all PENDING  Objective: Vital signs in last 24 hours: Temp:  [97.5 F (36.4 C)-99.1 F (37.3 C)] 97.5 F (36.4 C) (11/12 0445) Pulse Rate:  [63-120] 63  (11/12 0445) Resp:  [14-21] 20  (11/12 0445) BP: (112-158)/(71-92) 112/71 mmHg (11/12 0445) SpO2:  [92 %-99 %] 99 % (11/12 0445) Weight:  [121 kg (266 lb 12.1 oz)] 266 lb 12.1 oz (121 kg) (11/11 2000)  Intake/Output from previous day: 11/11 0701 - 11/12 0700 In: 420 [P.O.:120; I.V.:300] Out: 0  Intake/Output this shift: Total I/O In: 660 [P.O.:60; I.V.:600] Out: 1050 [Urine:1050] Nutritional status: NPO  Past Medical History  Diagnosis Date  . HTN (hypertension)   . HLD (hyperlipidemia)   . History of methicillin resistant staphylococcus aureus (MRSA)   . Sleep apnea   . CVA (cerebral infarction)   . Anemia   . Tachycardia   . Obesity   . Renal failure     due to vanc toxicity  . Osteomyelitis   . Lower back pain     Chronic  . Idiopathic peripheral neuropathy     NOS  . DJD (degenerative joint disease)   . Headache   . GERD (gastroesophageal reflux disease)   . DM type 2 (diabetes mellitus, type 2)   . Depression   . Anxiety   . Hypokalemia     resolved  . Cerebral infarct     left parietal and bilateral  . Esophagitis     distal  . Fluid overload     compensated  . Urinary retention   . H/O: pneumonia   . History of bacteremia   . Surgery, other elective     of the ear  . Stroke     Hx of left frontoparietal stroke in 12/11. History of right brain stroke.    Neurologic Exam: Mental Status: Alert, oriented, shows some receptive difficulty (base line per family member next to her)   Able to follow 2 step commands  Cranial Nerves: II: visual fields show decreased over the left aspect of her visual field , pupils equal, round,  reactive to light and accommodation III,IV, VI: ptosis not present, extraocular muscles extra-ocular motions intact bilaterally However she has a right gaze preference.  She is able to get her eyes all the way to the LEFT but shows more preference to the right.   V,VII: smile asymmetric, (+) left facial droop.  Able to wrinkled forehead on left but to less degree than right. VIII: hearing normal bilaterally IX,X: gag reflex present XI: trapezius strength/neck flexion strength normal bilaterally XII: tongue strength normal  Motor: Right : Upper extremity    Left:     Upper extremity 5/5 deltoid       1/5 deltoid 5/5 tricep      0/5 tricep 5/5 biceps      0/5 biceps  5/5wrist flexion     0/5 wrist flexion 5/5 wrist extension     0/5 wrist extension 5/5 hand grip      0/5 hand grip  Lower extremity     Lower extremity 5/5 hip flexor      4/5 hip flexor 5/5 hip adductors     4/5 hip adductors 5/5 hip abductors     4/5 hip abductors 5/5 quadricep      4/5 quadriceps  5/5 hamstrings  4/5 hamstrings 5/5 plantar flexion       4/5 plantar flexion 5/5 plantar extension     4/5 plantar extension Decreased tone over left arm otherwise Tone and bulk:normal tone throughout; no atrophy noted Sensory: Pinprick and light touch intact throughout, bilaterally Deep Tendon Reflexes: 2+ and symmetric throughout Plantars: Right: downgoing   Left: downgoing Cerebellar: Right normal finger-to-nose, normal rapid alternating movements and right normal heel-to-shin test  Lab Results:  Basename 04/23/11 0359 04/22/11 1545  WBC 6.9 7.0  HGB 8.4* 8.6*  HCT 27.4* 28.3*  PLT 378 412*  NA 134* 136  K 4.8 4.5  CL 103 104  CO2 25 25  GLUCOSE 205* 273*  BUN 24* 24*  CREATININE 1.44* 1.44*  CALCIUM 8.9 9.0  LABA1C -- --   Lipid Panel  Basename 04/23/11 0359  CHOL 230*  TRIG 116  HDL 49  CHOLHDL 4.7  VLDL 23  LDLCALC 158*    Studies/Results: Dg Chest 2 View  04/22/2011  *RADIOLOGY REPORT*   Clinical Data: Tachycardia  CHEST - 2 VIEW  Comparison: 06/19/2010  Findings: Cardiomegaly with pulmonary vascular congestion and suspected mild interstitial edema. No pleural effusion or pneumothorax.  Degenerative changes of the visualized thoracolumbar spine.  IMPRESSION: Cardiomegaly with pulmonary vascular congestion and suspected mild interstitial edema.  Original Report Authenticated By: Julian Hy, M.D.   Ct Head Wo Contrast  04/22/2011  *RADIOLOGY REPORT*  Clinical Data: Slurred speech, left-sided weakness  CT HEAD WITHOUT CONTRAST  Technique:  Contiguous axial images were obtained from the base of the skull through the vertex without contrast.  Comparison: Head MRI 06/13/2010  Findings: There is a cortical infarction in the high left frontal and parietal lobe which corresponds to prior infarction demonstrated on MRI 06/13/2010.  There is no CT evidence of acute infarction.  No intracranial hemorrhage.  No focal mass lesion.  No midline shift or mass effect.  No hydrocephalus.  Paranasal sinuses and mastoid air cells are clear.  Orbits are normal.  IMPRESSION:  1.  No acute intracranial findings. 2.  Remote left frontoparietal infarction.  Original Report Authenticated By: Suzy Bouchard, M.D.   Dg Foot Complete Left  04/22/2011  *RADIOLOGY REPORT*  Clinical Data: Great toe wound, evaluate for osteomyelitis  LEFT FOOT - COMPLETE 3+ VIEW  Comparison: MRI dated 06/27/2008  Findings: Soft tissue injury along the medial aspect of the first distal phalanx.  No definite underlying cortical irregularity to suggest osteomyelitis.  Prior resection of the second ray.  No fracture or dislocation is seen.  Plantar and posterior calcaneal enthesopathy.  Vascular calcifications.  IMPRESSION: Soft tissue injury along the medial aspect of the first distal phalanx.  No radiographic evidence of osteomyelitis.  Original Report Authenticated By: Julian Hy, M.D.    Medications:  Scheduled:   .  amitriptyline  50 mg Oral QHS  . aspirin  325 mg Oral Daily  . cycloSPORINE  1 drop Both Eyes BID  . enoxaparin  30 mg Subcutaneous Q24H  . ferrous sulfate  325 mg Oral BID  . gabapentin  600 mg Oral Q6H  . hydrochlorothiazide  12.5 mg Oral Daily  .  HYDROmorphone (DILAUDID) injection  0.5 mg Intravenous Once  . insulin aspart  0-15 Units Subcutaneous TID WC  . lisinopril  20 mg Oral BID  . metoprolol  75 mg Oral BID  . simvastatin  20 mg Oral q1800  . DISCONTD: gabapentin  600 mg Oral Q6H    Assessment/Plan: 50y/o RH AAW with  history of previous left temporoparietal infarct presenting with an episode of dysarthria today with clumsiness resolving to baseline. Her current deficits per her sister are at her baseline; however, her left sided weakness and neglect are not well explained by her previous stroke in 1/12 and were not mentioned on her discharge summary at that time. Her event today could be consistent with a lacunar infarct or TIA syndrome such as clumsy hand-dysarthria syndrome. Given her renal problems, she is not current a good candidate for contrasted studies.    Plan:  -Agree with plan for admission on telemetry for cardiac monitoring.   -Recommend MRI brain without contrast and MRA head and neck without contrast. (if positive for stroke recommend transfer to [facility] hospital so [facility] MD can follow)   -Recommend TCD/ and TTE. No bubble study  -Risk stratify with lipid profile, HgbA1c, and TSH.  -Current SBP in the 150s. Recommend aiming for SBPs of 140-160 at this time.  -If patient can tolerate, recommend aspirin at 325mg  daily for antiplatelet therapy.  -Given her recent complaints of chest pain, agree with plan to rule out with cardiac enzymes x3.    Etta Quill PA-C [facility] [phone]  04/23/2011, 8:41 AM

## 2011-04-23 NOTE — Progress Notes (Signed)
INITIAL ADULT NUTRITION ASSESSMENT Date: 04/23/2011   Time: 3:55 PM Reason for Assessment: consult ASSESSMENT: Female 50 y.o.  Dx: TIA (transient ischemic attack)  Hx:  Past Medical History  Diagnosis Date  . HTN (hypertension)   . HLD (hyperlipidemia)   . History of methicillin resistant staphylococcus aureus (MRSA)   . Sleep apnea   . CVA (cerebral infarction)   . Anemia   . Tachycardia   . Obesity   . Renal failure     due to vanc toxicity  . Osteomyelitis   . Lower back pain     Chronic  . Idiopathic peripheral neuropathy     NOS  . DJD (degenerative joint disease)   . Headache   . GERD (gastroesophageal reflux disease)   . DM type 2 (diabetes mellitus, type 2)   . Depression   . Anxiety   . Hypokalemia     resolved  . Cerebral infarct     left parietal and bilateral  . Esophagitis     distal  . Fluid overload     compensated  . Urinary retention   . H/O: pneumonia   . History of bacteremia   . Surgery, other elective     of the ear  . Stroke     Hx of left frontoparietal stroke in 12/11. History of right brain stroke.     Related Meds:  Scheduled Meds:   . amitriptyline  50 mg Oral QHS  . aspirin  325 mg Oral Daily  . cycloSPORINE  1 drop Both Eyes BID  . enoxaparin  30 mg Subcutaneous Q24H  . ferrous sulfate  325 mg Oral BID  . gabapentin  600 mg Oral Q6H  . hydrochlorothiazide  12.5 mg Oral Daily  .  HYDROmorphone (DILAUDID) injection  0.5 mg Intravenous Once  . insulin aspart  0-15 Units Subcutaneous TID WC  . lisinopril  20 mg Oral BID  . metoprolol  75 mg Oral BID  . simvastatin  20 mg Oral q1800  . DISCONTD: gabapentin  600 mg Oral Q6H   Continuous Infusions:   . sodium chloride 1,000 mL (04/23/11 0808)   PRN Meds:.acetaminophen   Ht: 5\' 7"  (170.2 cm)  Wt: 266 lb 12.1 oz (121 kg)  Ideal Wt: 81.1 kg % Ideal Wt: 149%  Usual Wt: 121 kg % Usual Wt: same  Body mass index is 41.78 kg/(m^2).  Food/Nutrition Related Hx: Pt  admitted with TIA with residual slurred speech and left facial droop; condition may be worsening per MD note.   Pt seen and assessed by SLP who determined pt to be pocketing foods, also with overt signs of aspiration.  Recommends Puree/thin.  Pt did get lunch tray today of which she consumed 100%. Attempted to speak with pt, however, currently sleeping with family at bedside.  Family requests do not disturb and to come back later.  Review of chart shows pt wt 120-121 kg Jan 2012 after first CVA in fall/winter 2011.  Pt has maintained this wt. HgBA1C improved, although remains elevated at 8.4%   HgBA1C previously 10.3-13.9% (2008-2011).    Labs:  CMP     Component Value Date/Time   NA 134* 04/23/2011 0359   K 4.8 04/23/2011 0359   CL 103 04/23/2011 0359   CO2 25 04/23/2011 0359   GLUCOSE 205* 04/23/2011 0359   BUN 24* 04/23/2011 0359   CREATININE 1.44* 04/23/2011 0359   CALCIUM 8.9 04/23/2011 0359   PROT 6.5 04/22/2011 1545  ALBUMIN 2.2* 04/22/2011 1545   AST 10 04/22/2011 1545   ALT 7 04/22/2011 1545   ALKPHOS 84 04/22/2011 1545   BILITOT 0.2* 04/22/2011 1545   GFRNONAA 42* 04/23/2011 0359   GFRAA 49* 04/23/2011 0359    Intake: 100% of meals x1 Output: no BM since admission  Diet Order: Dysphagia 1, thin liquids  Supplements/Tube Feeding: none at this time  IVF:    sodium chloride Last Rate: 1,000 mL (04/23/11 0808)    Estimated Nutritional Needs:   Kcal: 2030-2440 kcal Protein: 81-97g Fluid: ~2.5 L/day  NUTRITION DIAGNOSIS: -Biting/Chewing (NI-1.2).  Status: Ongoing  RELATED TO: TIA with residual facial droop  AS EVIDENCE BY: per SLP assessment, pt requiring dysphagia diet  MONITORING/EVALUATION(Goals): 1.  Food/Beverage; pt continues >75% of meals.  EDUCATION NEEDS: -Education not appropriate at this time  INTERVENTION: 1.  Meals/snacks; RD to follow to assess adequacy of intake.  Pt ate well at lunch today.  Encourage intake if needed.  RD to follow for  SLP/MD modifications to diet.  Dietitian #: [phone].  DOCUMENTATION CODES Per approved criteria  -Morbid Obesity    Brynda Greathouse [facility] 04/23/2011, 3:55 PM

## 2011-04-23 NOTE — Progress Notes (Signed)
*  PRELIMINARY RESULTS* Bilateral Carotid dopplers performed. Preliminary findings showed no ICA stenosis. Antegrade vertebral artery flow.  Mariann Barter 04/23/2011, 10:00 AM

## 2011-04-23 NOTE — Progress Notes (Signed)
Called [phone] gave report  to  [place] receiving RN, patient will be in room 3302.[facility] will p/u patient.

## 2011-04-23 NOTE — Progress Notes (Signed)
Physical Therapy Note  Order received. Chart reviewed. Noted progress notes indicating worsening/progression of symptoms. Also note pt with multiple tests scheduled/pending and possible transfer to [facility] to be followed by Stroke MD pending positive MRI results for acute event. Will check back another day to perform PT/OT evaluations if/when appropriate. Thanks.

## 2011-04-24 LAB — BASIC METABOLIC PANEL
BUN: 31 mg/dL — ABNORMAL HIGH (ref 6–23)
CO2: 20 mEq/L (ref 19–32)
Calcium: 8.8 mg/dL (ref 8.4–10.5)
Creatinine, Ser: 1.74 mg/dL — ABNORMAL HIGH (ref 0.50–1.10)
GFR calc non Af Amer: 34 mL/min — ABNORMAL LOW (ref >90)
Glucose, Bld: 178 mg/dL — ABNORMAL HIGH (ref 70–99)

## 2011-04-24 LAB — CBC
HCT: 27.7 % — ABNORMAL LOW (ref 36.0–46.0)
Hemoglobin: 8.6 g/dL — ABNORMAL LOW (ref 12.0–15.0)
MCH: 26.4 pg (ref 26.0–34.0)
MCHC: 31 g/dL (ref 30.0–36.0)
MCV: 85 fL (ref 78.0–100.0)
RBC: 3.26 MIL/uL — ABNORMAL LOW (ref 3.87–5.11)

## 2011-04-24 LAB — SEDIMENTATION RATE: Sed Rate: 130 mm/hr — ABNORMAL HIGH (ref 0–22)

## 2011-04-24 LAB — HOMOCYSTEINE: Homocysteine: 12 umol/L (ref 4.0–15.4)

## 2011-04-24 LAB — GLUCOSE, CAPILLARY: Glucose-Capillary: 156 mg/dL — ABNORMAL HIGH (ref 70–99)

## 2011-04-24 MED ORDER — ENOXAPARIN SODIUM 40 MG/0.4ML ~~LOC~~ SOLN
40.0000 mg | SUBCUTANEOUS | Status: DC
  Administered 2011-04-24 – 2011-04-25 (×2): 40 mg via SUBCUTANEOUS
  Filled 2011-04-24 (×3): qty 0.4

## 2011-04-24 NOTE — Progress Notes (Signed)
Occupational Therapy Evaluation Patient Details Name: [name] MRN: [id number] DOB: 03-29-1961 Today's Date: 04/24/2011  Problem List:  Patient Active Problem List  Diagnoses  . DIABETES MELLITUS, TYPE II  . HYPERLIPIDEMIA  . ANXIETY  . DEPRESSION  . NEUROPATHY, IDIOPATHIC PERIPHERAL NOS  . GERD  . DEGENERATIVE JOINT DISEASE  . Lumbago  . HEADACHE  . MRSA  . OBESITY  . ANEMIA  . HYPERTENSION  . CVA  . DVT  . RENAL FAILURE  . OSTEOMYELITIS  . SLEEP APNEA  . TACHYCARDIA  . CVA (cerebral vascular accident)  . Tachycardia    Past Medical History:  Past Medical History  Diagnosis Date  . HTN (hypertension)   . HLD (hyperlipidemia)   . History of methicillin resistant staphylococcus aureus (MRSA)   . Sleep apnea   . CVA (cerebral infarction)   . Anemia   . Tachycardia   . Obesity   . Renal failure     due to vanc toxicity  . Osteomyelitis   . Lower back pain     Chronic  . Idiopathic peripheral neuropathy     NOS  . DJD (degenerative joint disease)   . Headache   . GERD (gastroesophageal reflux disease)   . DM type 2 (diabetes mellitus, type 2)   . Depression   . Anxiety   . Hypokalemia     resolved  . Cerebral infarct     left parietal and bilateral  . Esophagitis     distal  . Fluid overload     compensated  . Urinary retention   . H/O: pneumonia   . History of bacteremia   . Surgery, other elective     of the ear  . Stroke     Hx of left frontoparietal stroke in 12/11. History of right brain stroke.   Past Surgical History:  Past Surgical History  Procedure Date  . Tympanoplasty   . Toe amputation     left 2nd toe  . Dilation and curettage of uterus     history    OT Assessment/Plan/Recommendation OT Assessment Clinical Impression Statement: Pt. with h/o left CVA now presenting with acute right CVA and experiencing considerable visual and cognitive defictis along with dense left hemiplegia. Will benefit from actue OT followed by  inpatient rehab to maximize independence with ADL and ADL mobility prior to d/c. OT Recommendation/Assessment: Patient will need skilled OT in the acute care venue OT Problem List: Decreased strength;Decreased range of motion;Impaired vision/perception;Impaired balance (sitting and/or standing);Decreased coordination;Decreased cognition;Decreased safety awareness;Decreased knowledge of precautions;Decreased knowledge of use of DME or AE;Impaired sensation;Obesity;Impaired UE functional use;Pain OT Therapy Diagnosis : Hemiplegia non-dominant side;Cognitive deficits;Disturbance of vision OT Plan OT Frequency: Min 2X/week OT Treatment/Interventions: Self-care/ADL training;Therapeutic exercise;Neuromuscular education;Therapeutic activities;DME and/or AE instruction;Cognitive remediation/compensation;Visual/perceptual remediation/compensation;Patient/family education;Balance training OT Recommendation Recommendations for Other Services: Rehab consult Follow Up Recommendations: Inpatient Rehab Equipment Recommended: Defer to next venue Individuals Consulted Consulted and Agree with Results and Recommendations: Family member/caregiver Family Member Consulted: Brother and son OT Goals Acute Rehab OT Goals OT Goal Formulation: With patient/family ADL Goals Pt Will Perform Grooming: with mod assist;Sitting at sink ADL Goal: Grooming - Progress: Other (comment) Pt Will Transfer to Toilet: with 2+ total assist;Stand pivot transfer;Extra wide 3-in-1;Drop arm 3-in-1 ADL Goal: Toilet Transfer - Progress: Other (comment) Additional ADL Goal #1: Patient will locate and retrieve items placed on right side with Mod VC's in prep for ADLs Miscellaneous OT Goals Miscellaneous OT Goal #1: Pt. will sit EOB >  or equal to 63min with Min guard assist as precursor to ADL's  OT Evaluation Precautions/Restrictions  Precautions Precautions: Fall Restrictions Weight Bearing Restrictions: No Prior Functioning Home  Living Bathroom Toilet: Standard Home Adaptive Equipment: Bedside commode/3-in-1;Walker - rolling Additional Comments: Pt. states she was bathing up at sink (sit->stand) from 3n1 with sisters help PTA Prior Function Level of Independence: Requires assistive device for independence;Needs assistance with ADLs Bath: Minimal ADL ADL Eating/Feeding: +1 Total assistance;Simulated Where Assessed - Eating/Feeding: Bed level Vision/Perception  Vision - History Patient Visual Report: Diplopia;Peripheral vision impairment Vision - Assessment Eye Alignment: Impaired (comment) (dysconjugate gaze) Vision Assessment: Vision tested Alignment/Gaze Preference: Chin down;Gaze right Tracking/Visual Pursuits: Other (comment) Diplopia Assessment: Present all the time/all directions Additional Comments: Mod VC to track from Rt->Lt. to midline. Unable to track past midline going Rt despite Max VC. Able to turn head past midline with initial physical assist, decreasing down to Mod VC.  Cognition Cognition Arousal/Alertness: Awake/alert Overall Cognitive Status: Impaired Orientation Level: Oriented to person;Oriented to place;Oriented to situation Safety/Judgement: Decreased awareness of safety precautions Decreased Safety/Judgement: Decreased awareness of need for assistance Safety/Judgement - Other Comments: Pt. stated she feels she can walk safely with RW Bed Mobility Pt. +2total A(pt.= 30%) with supine-> EOB with HOB elevated to 20 degrees. +2total A (pt.=20%) sit->supine, assist with bilateral lower extremeties. Sensation/Coordination Sensation Light Touch: Impaired by gross assessment Proprioception: Impaired by gross assessment Additional Comments: LUE Extremity Assessment RUE Assessment RUE Assessment: Exceptions to WFL (4/5) LUE Assessment LUE Assessment: Exceptions to [facility] LUE Strength LUE Overall Strength Comments: 0/5 End of Session OT - End of Session Activity Tolerance: Patient  tolerated treatment well Patient left: in bed;with call bell in reach;with family/visitor present Nurse Communication: Mobility status for transfers General Behavior During Session: [facility] for tasks performed   Remus Hagedorn 04/24/2011, 3:24 PM

## 2011-04-24 NOTE — Progress Notes (Signed)
Physical Therapy Evaluation Patient Details Name: [name] MRN: [id number] DOB: 05-06-1961 Today's Date: 04/24/2011  Problem List:  Patient Active Problem List  Diagnoses  . DIABETES MELLITUS, TYPE II  . HYPERLIPIDEMIA  . ANXIETY  . DEPRESSION  . NEUROPATHY, IDIOPATHIC PERIPHERAL NOS  . GERD  . DEGENERATIVE JOINT DISEASE  . Lumbago  . HEADACHE  . MRSA  . OBESITY  . ANEMIA  . HYPERTENSION  . CVA  . DVT  . RENAL FAILURE  . OSTEOMYELITIS  . SLEEP APNEA  . TACHYCARDIA  . CVA (cerebral vascular accident)  . Tachycardia    Past Medical History:  Past Medical History  Diagnosis Date  . HTN (hypertension)   . HLD (hyperlipidemia)   . History of methicillin resistant staphylococcus aureus (MRSA)   . Sleep apnea   . CVA (cerebral infarction)   . Anemia   . Tachycardia   . Obesity   . Renal failure     due to vanc toxicity  . Osteomyelitis   . Lower back pain     Chronic  . Idiopathic peripheral neuropathy     NOS  . DJD (degenerative joint disease)   . Headache   . GERD (gastroesophageal reflux disease)   . DM type 2 (diabetes mellitus, type 2)   . Depression   . Anxiety   . Hypokalemia     resolved  . Cerebral infarct     left parietal and bilateral  . Esophagitis     distal  . Fluid overload     compensated  . Urinary retention   . H/O: pneumonia   . History of bacteremia   . Surgery, other elective     of the ear  . Stroke     Hx of left frontoparietal stroke in 12/11. History of right brain stroke.   Past Surgical History:  Past Surgical History  Procedure Date  . Tympanoplasty   . Toe amputation     left 2nd toe  . Dilation and curettage of uterus     history    PT Assessment/Plan/Recommendation PT Assessment Clinical Impression Statement: Pt is 50 y.o. female with new large R CVA, wtih hx of L CVA and complex medical hx. Pt presents with severe L neglect, gaze disturbances, postural lmitations, and hemiplegia, currently requiring +2  Total A for bed mobility, likely needing a lift for transfers. Will follow acutely for strengthening, balance, and NMR to increase mobility and decrease caregiver burden at next venue of care.  PT Recommendation/Assessment: Patient will need skilled PT in the acute care venue PT Problem List: Decreased strength;Decreased range of motion;Decreased activity tolerance;Decreased balance;Decreased mobility;Decreased coordination;Decreased cognition;Decreased knowledge of use of DME;Decreased safety awareness;Decreased knowledge of precautions;Impaired sensation Barriers to Discharge:  (Stairs to enter home ) PT Therapy Diagnosis : Hemiplegia non-dominant side PT Plan PT Frequency: Min 4X/week PT Treatment/Interventions: DME instruction;Gait training;Functional mobility training;Balance training;Therapeutic exercise;Neuromuscular re-education;Cognitive remediation;Patient/family education;Wheelchair mobility training PT Recommendation Recommendations for Other Services: Rehab consult Follow Up Recommendations: Inpatient Rehab Equipment Recommended: Defer to next venue PT Goals  Acute Rehab PT Goals PT Goal Formulation: With patient/family Time For Goal Achievement: 2 weeks Pt will go Supine/Side to Sit: with mod assist;with rail PT Goal: Supine/Side to Sit - Progress: Progressing toward goal Pt will go Sit to Supine/Side: with mod assist;with rail PT Goal: Sit to Supine/Side - Progress: Progressing toward goal Pt will Transfer Sit to Stand/Stand to Sit: with upper extremity assist;with mod assist PT Transfer Goal: Sit to Stand/Stand to  Sit - Progress: Progressing toward goal Pt will Transfer Bed to Chair/Chair to Bed: with mod assist PT Transfer Goal: Bed to Chair/Chair to Bed - Progress: Progressing toward goal Pt will Ambulate: 1 - 15 feet;with max assist;with rolling walker PT Goal: Ambulate - Progress: Progressing toward goal  PT Evaluation Precautions/Restrictions   Precautions Precautions: Fall Prior Functioning  Home Living Lives With: Other (Comment) (Sister lives with her) Receives Help From: Family Type of Home: House Home Layout: One level Home Access: Stairs to enter Entrance Stairs-Rails: Right Entrance Stairs-Number of Steps: 5 Bathroom Toilet: Standard Home Adaptive Equipment: Bedside commode/3-in-1;Walker - rolling Additional Comments: Pt. states she was bathing up at sink (sit->stand) from 3n1 with sisters help PTA Prior Function Level of Independence: Requires assistive device for independence;Needs assistance with gait;Needs assistance with tranfers;Needs assistance with ADLs Bath: Minimal Able to Take Stairs?: Yes Vocation: Unemployed Cognition Cognition Arousal/Alertness: Awake/alert Overall Cognitive Status: Impaired Orientation Level: Oriented to person;Oriented to place;Oriented to situation Safety/Judgement: Decreased awareness of safety precautions;Decreased safety judgement for tasks assessed Decreased Safety/Judgement: Decreased awareness of need for assistance Safety/Judgement - Other Comments: Pt. stated she feels she can walk safely with RW Sensation/Coordination Sensation Light Touch: Impaired by gross assessment Proprioception: Impaired by gross assessment Additional Comments: L LE Coordination Gross Motor Movements are Fluid and Coordinated: No Fine Motor Movements are Fluid and Coordinated: No Coordination and Movement Description: Impaired toe tap Finger Nose Finger Test: impaired - pt grossly unable to gaze to midline  Extremity Assessment RUE Assessment RUE Assessment: Exceptions to WFL (4/5) LUE Assessment LUE Assessment: Exceptions to [facility] LUE Strength LUE Overall Strength Comments: 0/5 RLE Assessment RLE Assessment: Exceptions to [facility] RLE Strength RLE Overall Strength Comments: Grossly 4/5 throughout, difficulty to fully assess due to trouble following commands LLE Assessment LLE Assessment:  Exceptions to [facility] LLE Strength LLE Overall Strength Comments: L hip 1/5, knee 2+/5, DF 2+/5 Mobility (including Balance) Bed Mobility Bed Mobility: Yes Supine to Sit: 1: +2 Total assist;Patient percentage (comment);HOB elevated (Comment degrees);With rails Supine to Sit Details (indicate cue type and reason): Pt 30% with strong use of R UE, HOB 40deg Sit to Supine - Left: 1: +2 Total assist;Patient percentage (comment);HOB flat;With rail Sit to Supine - Left Details (indicate cue type and reason): Pt 30% with strong use of R UE Transfers Transfers: No (Pt likely to need lift for transfers at this time. ) Ambulation/Gait Ambulation/Gait: No Stairs: No Wheelchair Mobility Wheelchair Mobility: No  Posture/Postural Control Posture/Postural Control: Postural limitations Postural Limitations: Pt with significant L lean and impaired posture, collapsing trunk forward. Max manual and verbal cues for upright and midline, decreasing to Mod A. Possibly pushing L. Diplopia present as well as decreased visual tracking R>L, unable to reach midline End of Session PT - End of Session Activity Tolerance: Patient tolerated treatment well Patient left: in bed;with call bell in reach;with family/visitor present Nurse Communication: Need for lift equipment General Behavior During Session: [facility] for tasks performed Cognition: Impaired (Pt aware of CVA and affected side, but thinks she can use RW)  [employer] 04/24/2011, 3:53 PM

## 2011-04-24 NOTE — Progress Notes (Signed)
Speech Language/Pathology Cognitive/linguistic evaluation  Clinical Impression:  Pt presents with a baseline mild aphasia with overlying cognitive deficits as a result of acute right CVA.  Demonstrates mod-severe impairments in all areas of cognition, but particularly visual organization with a left neglect and right gaze preference.  There is poor sustained attention to visual and verbal input, anosagnosia, and impaired problem solving, impacting awareness and safety.  Mild spastic dysarthria present after bilateral UMN involvement.      Plan:  1.  Acute SLP f/u to address deficits described above. 2.  Continue dysphagia tx after yesterday's bedside swallow eval.  Continue Dysphagia 1 with thin liquids. 3.  Rec CIR consult.   Juan Quam  MA, CCC/SLP Pager [phone]      Dolphus Jenny 04/24/2011, 12:28 PM

## 2011-04-24 NOTE — Progress Notes (Signed)
Stroke Team Progress Note  SUBJECTIVE [name] is a 50 year old female with history of hypertension tachycardia hyperlipidemia prior history of CVA in December of 2011 with residual aphasia and left hemiplegia who presents to the ED with slurred speech. Per patient's sister, and her the way out today as patient was trying to get in the car she developed slurred speech, slight left facial droop.  Her symptoms improved initially on admission and she was felt to be at TIA hence was not given TPA. However she got worse after admission and was found to have dense left hemiplegia and neglect. MRI scan done today shows a large right frontal MCA branch infarct. She has a previous history of a large superior division MCA infarct in December 2011 at that time she had an extensive workup which was negative for source of embolism. She previously had a transesophageal echocardiogram done in 2010 and hence it was not repeated at the last admission. She had 3 week of outpatient cardiac telemetry performed which revealed no evidence of paroxysmal atrial fibrillation he did she has no history of clots in her legs, lungs or family history of strokes or heart attacks at a young age 47 Vital signs: Temp: 98.3 F (36.8 C) (11/13 1023) Temp src: Oral (11/13 1023) BP: 97/54 mmHg (11/13 1023) Pulse Rate: 88  (11/13 1023) Respiratory Rate: 18  Intake/Output from previous day: 11/12 0701 - 11/13 0700 In: 2460 [P.O.:360; I.V.:2100] Out: 1200 [Urine:1200]   IV Fluid Intake:     . sodium chloride 75 mL/hr at 04/24/11 0953    Diet: NPO currently, SLP reconmmends Dysphagia 1, thin liquids  Activity: Bathroom privileges assistance  DVT Prophylaxis:  Lovenox 40 mg sq daily   Studies: Labs Reviewed  GLUCOSE, CAPILLARY - Abnormal; Notable for the following:    Glucose-Capillary 234 (*)    All other components within normal limits  CBC - Abnormal; Notable for the following:    RBC 3.29 (*)    Hemoglobin  8.6 (*)    HCT 28.3 (*)    Platelets 412 (*)    All other components within normal limits  COMPREHENSIVE METABOLIC PANEL - Abnormal; Notable for the following:    Glucose, Bld 273 (*)    BUN 24 (*)    Creatinine, Ser 1.44 (*)    Albumin 2.2 (*)    Total Bilirubin 0.2 (*)    GFR calc non Af Amer 42 (*)    GFR calc Af Amer 49 (*)    All other components within normal limits  URINALYSIS, ROUTINE W REFLEX MICROSCOPIC - Abnormal; Notable for the following:    Appearance CLOUDY (*)    Glucose, UA 250 (*)    Protein, ur >300 (*)    All other components within normal limits  APTT - Abnormal; Notable for the following:    aPTT 41 (*)    All other components within normal limits  HEMOGLOBIN A1C - Abnormal; Notable for the following:    Hemoglobin A1C 8.4 (*)    Mean Plasma Glucose 194 (*)    All other components within normal limits  LIPID PANEL - Abnormal; Notable for the following:    Cholesterol 230 (*)    LDL Cholesterol 158 (*)    All other components within normal limits  CBC - Abnormal; Notable for the following:    RBC 3.18 (*)    Hemoglobin 8.4 (*)    HCT 27.4 (*)    All other components within normal limits  BASIC  METABOLIC PANEL - Abnormal; Notable for the following:    Sodium 134 (*)    Glucose, Bld 205 (*)    BUN 24 (*)    Creatinine, Ser 1.44 (*)    GFR calc non Af Amer 42 (*)    GFR calc Af Amer 49 (*)    All other components within normal limits  GLUCOSE, CAPILLARY - Abnormal; Notable for the following:    Glucose-Capillary 174 (*)    All other components within normal limits  GLUCOSE, CAPILLARY - Abnormal; Notable for the following:    Glucose-Capillary 171 (*)    All other components within normal limits  GLUCOSE, CAPILLARY - Abnormal; Notable for the following:    Glucose-Capillary 194 (*)    All other components within normal limits  GLUCOSE, CAPILLARY - Abnormal; Notable for the following:    Glucose-Capillary 169 (*)    All other components within  normal limits  GLUCOSE, CAPILLARY - Abnormal; Notable for the following:    Glucose-Capillary 155 (*)    All other components within normal limits  GLUCOSE, CAPILLARY - Abnormal; Notable for the following:    Glucose-Capillary 180 (*)    All other components within normal limits  GLUCOSE, CAPILLARY - Abnormal; Notable for the following:    Glucose-Capillary 182 (*)    All other components within normal limits  GLUCOSE, CAPILLARY - Abnormal; Notable for the following:    Glucose-Capillary 156 (*)    All other components within normal limits  DIFFERENTIAL  CARDIAC PANEL(CRET KIN+CKTOT+MB+TROPI)  URINE MICROSCOPIC-ADD ON  TYPE AND SCREEN  PROTIME-INR  PREPARE RBC (CROSSMATCH)  OCCULT BLOOD, POC DEVICE  URINE RAPID DRUG SCREEN (HOSP PERFORMED)  TSH  CARDIAC PANEL(CRET KIN+CKTOT+MB+TROPI)  CARDIAC PANEL(CRET KIN+CKTOT+MB+TROPI)  MRSA PCR SCREENING  CARDIAC PANEL(CRET KIN+CKTOT+MB+TROPI)  LAB REPORT - SCANNED  POCT CBG MONITORING  GLUCOSE, POCT (MANUAL RESULT ENTRY)  GLUCOSE, POCT (MANUAL RESULT ENTRY)  GLUCOSE, POCT (MANUAL RESULT ENTRY)  GLUCOSE, POCT (MANUAL RESULT ENTRY)  GLUCOSE, POCT (MANUAL RESULT ENTRY)  GLUCOSE, POCT (MANUAL RESULT ENTRY)  GLUCOSE, POCT (MANUAL RESULT ENTRY)  GLUCOSE, POCT (MANUAL RESULT ENTRY)  GLUCOSE, POCT (MANUAL RESULT ENTRY)    CBC    Component Value Date/Time   WBC 6.9 04/23/2011 0359   RBC 3.18* 04/23/2011 0359   HGB 8.4* 04/23/2011 0359   HCT 27.4* 04/23/2011 0359   PLT 378 04/23/2011 0359   MCV 86.2 04/23/2011 0359   MCH 26.4 04/23/2011 0359   MCHC 30.7 04/23/2011 0359   RDW 15.4 04/23/2011 0359   LYMPHSABS 1.8 04/22/2011 1545   MONOABS 0.6 04/22/2011 1545   EOSABS 0.2 04/22/2011 1545   BASOSABS 0.0 04/22/2011 1545   CMP     Component Value Date/Time   NA 134* 04/23/2011 0359   K 4.8 04/23/2011 0359   CL 103 04/23/2011 0359   CO2 25 04/23/2011 0359   GLUCOSE 205* 04/23/2011 0359   BUN 24* 04/23/2011 0359   CREATININE 1.44*  04/23/2011 0359   CALCIUM 8.9 04/23/2011 0359   PROT 6.5 04/22/2011 1545   ALBUMIN 2.2* 04/22/2011 1545   AST 10 04/22/2011 1545   ALT 7 04/22/2011 1545   ALKPHOS 84 04/22/2011 1545   BILITOT 0.2* 04/22/2011 1545   GFRNONAA 42* 04/23/2011 0359   GFRAA 49* 04/23/2011 0359   COAGS     Lipid Panel     Component Value Date/Time   CHOL 230* 04/23/2011 0359   TRIG 116 04/23/2011 0359   HDL 49 04/23/2011 0359   CHOLHDL  4.7 04/23/2011 0359   VLDL 23 04/23/2011 0359   LDLCALC 158* 04/23/2011 0359   hgba1C    Lab Results  Component Value Date   HGBA1C 8.4* 04/22/2011   HGBA1C 10.3* 08/25/2008    Dg Chest 2 View  04/22/2011  *RADIOLOGY REPORT*  Clinical Data: Tachycardia  CHEST - 2 VIEW  Comparison: 06/19/2010  Findings: Cardiomegaly with pulmonary vascular congestion and suspected mild interstitial edema. No pleural effusion or pneumothorax.  Degenerative changes of the visualized thoracolumbar spine.  IMPRESSION: Cardiomegaly with pulmonary vascular congestion and suspected mild interstitial edema.  Original Report Authenticated By: Julian Hy, M.D.   Ct Head Wo Contrast  04/22/2011  *RADIOLOGY REPORT*  Clinical Data: Slurred speech, left-sided weakness  CT HEAD WITHOUT CONTRAST  Technique:  Contiguous axial images were obtained from the base of the skull through the vertex without contrast.  Comparison: Head MRI 06/13/2010  Findings: There is a cortical infarction in the high left frontal and parietal lobe which corresponds to prior infarction demonstrated on MRI 06/13/2010.  There is no CT evidence of acute infarction.  No intracranial hemorrhage.  No focal mass lesion.  No midline shift or mass effect.  No hydrocephalus.  Paranasal sinuses and mastoid air cells are clear.  Orbits are normal.  IMPRESSION:  1.  No acute intracranial findings. 2.  Remote left frontoparietal infarction.  Original Report Authenticated By: Suzy Bouchard, M.D.   Mr Angiogram Head Wo  Contrast  04/23/2011  *RADIOLOGY REPORT*  Clinical Data:  Slurred speech and left-sided weakness.  MRI HEAD WITHOUT CONTRAST MRA HEAD WITHOUT CONTRAST  Technique:  Multiplanar, multiecho pulse sequences of the brain and surrounding structures were obtained without intravenous contrast. Angiographic images of the head were obtained using MRA technique without contrast.  Comparison:   04/22/2011 CT head.  MRI HEAD  Findings:  The patient was moderately uncooperative and could not remain motionless for the study.  Images are suboptimal.  Small or subtle lesions could be overlooked.  There is a large area of acute infarction affecting the right insular cortex, right posterior frontal cortex, right superior temporal cortex, and underlying white matter.  There is no associated hemorrhage.  There is no hydrocephalus, mass lesion, or midline shift.  No extra-axial fluid collections are seen. There is moderate atrophy with chronic microvascular ischemic change.  There is a large remote left parietal infarct with gliosis and encephalomalacia.  The carotid and basilar arteries are patent based on flow void signal.  Pituitary and cerebellar tonsils are unremarkable.  There is no sinus or mastoid disease. 1 cm intraparotid lymph node suggested on the right.  Compared to prior CT, the acute infarction is not visible.  IMPRESSION: Large acute right hemisphere MCA territory infarct without hemorrhage or midline shift.  Atrophy and small vessel disease.  MRA HEAD  Findings: Mild nonstenotic irregularity both carotid siphons.  No supraclinoid ICA stenosis.  Basilar artery widely patent vertebrals codominant.  There is no proximal anterior, middle, or posterior cerebral artery stenosis. Mild irregularity of the distal MCA and PCA branches suggests intracranial atherosclerotic change, with a reduced number of M3 vessels on the right as compared to the left, correlating with the distribution of acute infarction.  IMPRESSION: No  proximal carotid or basilar stenosis.  Reduced number of M3 vessels into the right middle cerebral artery territory.  Mild irregularity of the distal MCA and PCA branches which are visualized suggest intracranial atherosclerotic change.  Original Report Authenticated By: Staci Righter, M.D.   Mri  Brain Without Contrast  04/23/2011  *RADIOLOGY REPORT*  Clinical Data:  Slurred speech and left-sided weakness.  MRI HEAD WITHOUT CONTRAST MRA HEAD WITHOUT CONTRAST  Technique:  Multiplanar, multiecho pulse sequences of the brain and surrounding structures were obtained without intravenous contrast. Angiographic images of the head were obtained using MRA technique without contrast.  Comparison:   04/22/2011 CT head.  MRI HEAD  Findings:  The patient was moderately uncooperative and could not remain motionless for the study.  Images are suboptimal.  Small or subtle lesions could be overlooked.  There is a large area of acute infarction affecting the right insular cortex, right posterior frontal cortex, right superior temporal cortex, and underlying white matter.  There is no associated hemorrhage.  There is no hydrocephalus, mass lesion, or midline shift.  No extra-axial fluid collections are seen. There is moderate atrophy with chronic microvascular ischemic change.  There is a large remote left parietal infarct with gliosis and encephalomalacia.  The carotid and basilar arteries are patent based on flow void signal.  Pituitary and cerebellar tonsils are unremarkable.  There is no sinus or mastoid disease. 1 cm intraparotid lymph node suggested on the right.  Compared to prior CT, the acute infarction is not visible.  IMPRESSION: Large acute right hemisphere MCA territory infarct without hemorrhage or midline shift.  Atrophy and small vessel disease.  MRA HEAD  Findings: Mild nonstenotic irregularity both carotid siphons.  No supraclinoid ICA stenosis.  Basilar artery widely patent vertebrals codominant.  There is no  proximal anterior, middle, or posterior cerebral artery stenosis. Mild irregularity of the distal MCA and PCA branches suggests intracranial atherosclerotic change, with a reduced number of M3 vessels on the right as compared to the left, correlating with the distribution of acute infarction.  IMPRESSION: No proximal carotid or basilar stenosis.  Reduced number of M3 vessels into the right middle cerebral artery territory.  Mild irregularity of the distal MCA and PCA branches which are visualized suggest intracranial atherosclerotic change.  Original Report Authenticated By: Staci Righter, M.D.   Dg Foot Complete Left  04/22/2011  *RADIOLOGY REPORT*  Clinical Data: Great toe wound, evaluate for osteomyelitis  LEFT FOOT - COMPLETE 3+ VIEW  Comparison: MRI dated 06/27/2008  Findings: Soft tissue injury along the medial aspect of the first distal phalanx.  No definite underlying cortical irregularity to suggest osteomyelitis.  Prior resection of the second ray.  No fracture or dislocation is seen.  Plantar and posterior calcaneal enthesopathy.  Vascular calcifications.  IMPRESSION: Soft tissue injury along the medial aspect of the first distal phalanx.  No radiographic evidence of osteomyelitis.  Original Report Authenticated By: Julian Hy, M.D.    2D Echocardiogram: performed. mild LVH. Systolic function was moderately to severely reduced. The estimated ejection fraction was in the range of 30% to 35%, worse since prior 2D  TEE - 06/28/2008 - performed by Dr. Stanford Breed shows EF 55-65%, no PFO, no vegetations  Carotid Doppler:  Ordered, not yet done  Physical Exam:   Obese young African American lady not in distress. Awake, alert, oriented x3. Speech and language appear normal. She's able to name and repeat quite well. She has right gaze preference but is able to look to the left. There is dense left homonymous hemianopsia. There is minimal left lower facial symmetry. She has dense left upper  extremity plegia with 0/5 strength. Left lower extremity strength is 2-3/5. There is mild left hemi-neglect and hemisensory loss. Right plantar is downgoing left is upgoing.  Cardiac exam regular heart sounds no murmur or gallop. Lungs are clear to auscultation. ASSESSMENT Patient Active Problem List  Diagnoses  . DIABETES MELLITUS, TYPE II  . HYPERLIPIDEMIA  . ANXIETY  . DEPRESSION  . NEUROPATHY, IDIOPATHIC PERIPHERAL NOS  . GERD  . DEGENERATIVE JOINT DISEASE  . Lumbago  . HEADACHE  . MRSA  . OBESITY  . ANEMIA  . HYPERTENSION  . CVA  . DVT  . RENAL FAILURE  . OSTEOMYELITIS  . SLEEP APNEA  . TACHYCARDIA  . TIA (transient ischemic attack)  . Tachycardia   Ms. Generra Gradwell is a 50 y.o. female with a history of embolic stroke Jan 0000000, source not found. Now with with fluctuating slurred speech and left-sided weakness which has worsened since admission. MRI shows a right frontal middle cerebral artery branch infarct. She has multiple uncontrolled wrist factors of hypertension, diabetes, hyperlipidemia and a basically.  Hospital day # 2  TREATMENT/PLAN Repeat TEE as she has had a significant decline in her ejection fraction on the echo to look for intracardiac thrombus. Check hypercoag labs and vasculitis panel. Resume diet.continue antiplatelet therapy for now. Physical occupational and speech therapy. Rehabilitation consult. I had a long discussion with the patient and her sister with regards to her clinical presentation, a plan of care and prognosis and answered questions. Stroke team will follow.  Burnetta Sabin, AVNP, ANP-BC, GNP-BC [facility] Pager: [phone] 04/24/2011 11:15 AM

## 2011-04-24 NOTE — Progress Notes (Addendum)
Inpatient Diabetes Program Recommendations  AACE/ADA: New Consensus Statement on Inpatient Glycemic Control (2009)  Target Ranges:  Prepandial:   less than 140 mg/dL      Peak postprandial:   less than 180 mg/dL (1-2 hours)      Critically ill patients:  140 - 180 mg/dL   Reason for Visit: Elevated A1c  Inpatient Diabetes Program Recommendations Oral Agents: Consider adding DPP4 inhibitor also to diabetes regimen such as Januvia. HgbA1C: A1c=8.4% indicating suboptimal glycemic control  Note: Spoke to patient and sister.  Patient does not check blood sugars at home but does have meter.  They are not sure how to use.  Asked them to bring in to hospital tomorrow and I will assist them with meter.  Will follow.

## 2011-04-24 NOTE — Progress Notes (Signed)
Patient admitted, await pt/ot eval.  NCM will continue to follow for dc needs

## 2011-04-24 NOTE — Progress Notes (Addendum)
Subjective: continues to have significant left upper and lower extremity weakness.  Objective: Vital signs Filed Vitals:   04/23/11 1900 04/24/11 0100 04/24/11 0500 04/24/11 1023  BP: 111/76 105/71 115/69 97/54  Pulse: 88 90 84 88  Temp: 98.9 F (37.2 C) 97.9 F (36.6 C) 97.7 F (36.5 C) 98.3 F (36.8 C)  TempSrc: Oral Oral Oral Oral  Resp: 18 17 17 18   Height: 5\' 7"  (1.702 m)     Weight: 123.2 kg (271 lb 9.7 oz)     SpO2: 95% 99% 98% 100%   Weight change: 2.2 kg (4 lb 13.6 oz) Last BM Date: 04/21/11  Intake/Output from previous day: 11/12 0701 - 11/13 0700 In: 2460 [P.O.:360; I.V.:2100] Out: 1200 [Urine:1200]   Physical Exam: General: Alert, awake, oriented x3, in no acute distress. HEENT: No bruits, no goiter. Heart: Regular rate and rhythm, without murmurs, rubs, gallops. Lungs: Clear to auscultation bilaterally. Normal effort. No wheeze, rhonchi rales Abdomen: Soft, nontender, nondistended, positive bowel sounds. Obese Extremities: No clubbing cyanosis or edema with positive pedal pulses.  Neuro: Left facial droop. Left upper extremity is flaccid strength 0/5, left lower extremity is 2/5  Lab Results: Basic Metabolic Panel:  Basename 04/23/11 0359 04/22/11 1545  NA 134* 136  K 4.8 4.5  CL 103 104  CO2 25 25  GLUCOSE 205* 273*  BUN 24* 24*  CREATININE 1.44* 1.44*  CALCIUM 8.9 9.0  MG -- --  PHOS -- --   Liver Function Tests:  Basename 04/22/11 1545  AST 10  ALT 7  ALKPHOS 84  BILITOT 0.2*  PROT 6.5  ALBUMIN 2.2*   No results found for this basename: LIPASE:2,AMYLASE:2 in the last 72 hours No results found for this basename: AMMONIA:2 in the last 72 hours CBC:  Basename 04/23/11 0359 04/22/11 1545  WBC 6.9 7.0  NEUTROABS -- 4.5  HGB 8.4* 8.6*  HCT 27.4* 28.3*  MCV 86.2 86.0  PLT 378 412*   Cardiac Enzymes:  Basename 04/23/11 1219 04/23/11 0359 04/22/11 2017  CKTOTAL 63 69 94  CKMB 3.1 2.9 3.7  CKMBINDEX -- -- --  TROPONINI <0.30 <0.30  <0.30   BNP: No results found for this basename: POCBNP:3 in the last 72 hours D-Dimer: No results found for this basename: DDIMER:2 in the last 72 hours CBG:  Basename 04/24/11 1126 04/24/11 0707 04/23/11 2315 04/23/11 1607 04/23/11 1157 04/23/11 0758  GLUCAP 150* 156* 182* 180* 155* 169*   Hemoglobin A1C:  Basename 04/22/11 2017  HGBA1C 8.4*   Fasting Lipid Panel:  Basename 04/23/11 0359  CHOL 230*  HDL 49  LDLCALC 158*  TRIG 116  CHOLHDL 4.7  LDLDIRECT --   Thyroid Function Tests:  Basename 04/22/11 2017  TSH 4.099  T4TOTAL --  FREET4 --  T3FREE --  THYROIDAB --   Anemia Panel: No results found for this basename: VITAMINB12,FOLATE,FERRITIN,TIBC,IRON,RETICCTPCT in the last 72 hours Coagulation:  Basename 04/22/11 1725  LABPROT 14.8  INR 1.14   Urine Drug Screen:  Alcohol Level: No results found for this basename: ETH:2 in the last 72 hours Urinalysis:  Misc. Labs:  Recent Results (from the past 240 hour(s))  MRSA PCR SCREENING     Status: Normal   Collection Time   04/22/11 10:29 PM      Component Value Range Status Comment   MRSA by PCR NEGATIVE  NEGATIVE  Final     Studies/Results: Dg Chest 2 View  04/22/2011  *RADIOLOGY REPORT*  Clinical Data: Tachycardia  CHEST -  2 VIEW  Comparison: 06/19/2010  Findings: Cardiomegaly with pulmonary vascular congestion and suspected mild interstitial edema. No pleural effusion or pneumothorax.  Degenerative changes of the visualized thoracolumbar spine.  IMPRESSION: Cardiomegaly with pulmonary vascular congestion and suspected mild interstitial edema.  Original Report Authenticated By: Julian Hy, M.D.   Ct Head Wo Contrast  04/22/2011  *RADIOLOGY REPORT*  Clinical Data: Slurred speech, left-sided weakness  CT HEAD WITHOUT CONTRAST  Technique:  Contiguous axial images were obtained from the base of the skull through the vertex without contrast.  Comparison: Head MRI 06/13/2010  Findings: There is a cortical  infarction in the high left frontal and parietal lobe which corresponds to prior infarction demonstrated on MRI 06/13/2010.  There is no CT evidence of acute infarction.  No intracranial hemorrhage.  No focal mass lesion.  No midline shift or mass effect.  No hydrocephalus.  Paranasal sinuses and mastoid air cells are clear.  Orbits are normal.  IMPRESSION:  1.  No acute intracranial findings. 2.  Remote left frontoparietal infarction.  Original Report Authenticated By: Suzy Bouchard, M.D.   Mr Angiogram Head Wo Contrast  04/23/2011  *RADIOLOGY REPORT*  Clinical Data:  Slurred speech and left-sided weakness.  MRI HEAD WITHOUT CONTRAST MRA HEAD WITHOUT CONTRAST  Technique:  Multiplanar, multiecho pulse sequences of the brain and surrounding structures were obtained without intravenous contrast. Angiographic images of the head were obtained using MRA technique without contrast.  Comparison:   04/22/2011 CT head.  MRI HEAD  Findings:  The patient was moderately uncooperative and could not remain motionless for the study.  Images are suboptimal.  Small or subtle lesions could be overlooked.  There is a large area of acute infarction affecting the right insular cortex, right posterior frontal cortex, right superior temporal cortex, and underlying white matter.  There is no associated hemorrhage.  There is no hydrocephalus, mass lesion, or midline shift.  No extra-axial fluid collections are seen. There is moderate atrophy with chronic microvascular ischemic change.  There is a large remote left parietal infarct with gliosis and encephalomalacia.  The carotid and basilar arteries are patent based on flow void signal.  Pituitary and cerebellar tonsils are unremarkable.  There is no sinus or mastoid disease. 1 cm intraparotid lymph node suggested on the right.  Compared to prior CT, the acute infarction is not visible.  IMPRESSION: Large acute right hemisphere MCA territory infarct without hemorrhage or midline shift.   Atrophy and small vessel disease.  MRA HEAD  Findings: Mild nonstenotic irregularity both carotid siphons.  No supraclinoid ICA stenosis.  Basilar artery widely patent vertebrals codominant.  There is no proximal anterior, middle, or posterior cerebral artery stenosis. Mild irregularity of the distal MCA and PCA branches suggests intracranial atherosclerotic change, with a reduced number of M3 vessels on the right as compared to the left, correlating with the distribution of acute infarction.  IMPRESSION: No proximal carotid or basilar stenosis.  Reduced number of M3 vessels into the right middle cerebral artery territory.  Mild irregularity of the distal MCA and PCA branches which are visualized suggest intracranial atherosclerotic change.  Original Report Authenticated By: Staci Righter, M.D.   Mri Brain Without Contrast  04/23/2011  *RADIOLOGY REPORT*  Clinical Data:  Slurred speech and left-sided weakness.  MRI HEAD WITHOUT CONTRAST MRA HEAD WITHOUT CONTRAST  Technique:  Multiplanar, multiecho pulse sequences of the brain and surrounding structures were obtained without intravenous contrast. Angiographic images of the head were obtained using MRA technique without contrast.  Comparison:   04/22/2011 CT head.  MRI HEAD  Findings:  The patient was moderately uncooperative and could not remain motionless for the study.  Images are suboptimal.  Small or subtle lesions could be overlooked.  There is a large area of acute infarction affecting the right insular cortex, right posterior frontal cortex, right superior temporal cortex, and underlying white matter.  There is no associated hemorrhage.  There is no hydrocephalus, mass lesion, or midline shift.  No extra-axial fluid collections are seen. There is moderate atrophy with chronic microvascular ischemic change.  There is a large remote left parietal infarct with gliosis and encephalomalacia.  The carotid and basilar arteries are patent based on flow void  signal.  Pituitary and cerebellar tonsils are unremarkable.  There is no sinus or mastoid disease. 1 cm intraparotid lymph node suggested on the right.  Compared to prior CT, the acute infarction is not visible.  IMPRESSION: Large acute right hemisphere MCA territory infarct without hemorrhage or midline shift.  Atrophy and small vessel disease.  MRA HEAD  Findings: Mild nonstenotic irregularity both carotid siphons.  No supraclinoid ICA stenosis.  Basilar artery widely patent vertebrals codominant.  There is no proximal anterior, middle, or posterior cerebral artery stenosis. Mild irregularity of the distal MCA and PCA branches suggests intracranial atherosclerotic change, with a reduced number of M3 vessels on the right as compared to the left, correlating with the distribution of acute infarction.  IMPRESSION: No proximal carotid or basilar stenosis.  Reduced number of M3 vessels into the right middle cerebral artery territory.  Mild irregularity of the distal MCA and PCA branches which are visualized suggest intracranial atherosclerotic change.  Original Report Authenticated By: Staci Righter, M.D.   Dg Foot Complete Left  04/22/2011  *RADIOLOGY REPORT*  Clinical Data: Great toe wound, evaluate for osteomyelitis  LEFT FOOT - COMPLETE 3+ VIEW  Comparison: MRI dated 06/27/2008  Findings: Soft tissue injury along the medial aspect of the first distal phalanx.  No definite underlying cortical irregularity to suggest osteomyelitis.  Prior resection of the second ray.  No fracture or dislocation is seen.  Plantar and posterior calcaneal enthesopathy.  Vascular calcifications.  IMPRESSION: Soft tissue injury along the medial aspect of the first distal phalanx.  No radiographic evidence of osteomyelitis.  Original Report Authenticated By: Julian Hy, M.D.    Medications: Scheduled Meds:    . amitriptyline  50 mg Oral QHS  . aspirin  325 mg Oral Daily  . cycloSPORINE  1 drop Both Eyes BID  .  enoxaparin (LOVENOX) injection  40 mg Subcutaneous Q24H  . ferrous sulfate  325 mg Oral BID  . gabapentin  600 mg Oral Q6H  . hydrochlorothiazide  12.5 mg Oral Daily  . insulin aspart  0-15 Units Subcutaneous TID WC  . lisinopril  20 mg Oral BID  . metoprolol  75 mg Oral BID  . simvastatin  20 mg Oral q1800  . DISCONTD: enoxaparin  30 mg Subcutaneous Q24H   Continuous Infusions:    . sodium chloride 75 mL/hr at 04/24/11 0953   PRN Meds:.acetaminophen  Assessment/Plan:  Principal Problem: 1. large right MCA CVA: Continue aspirin, neurology following , plan for TEE today .  Appreciate neuro assistance. 2d echo shows an EF of 30-35%, MRA with no significant abnormalities. PT/OT eval 2. Cardiomyopathy: EF of 30-35%, suspect secondary to hypertension based on diffuse hypokinesis, clinically compensated continue Lopressor, lisinopril, follow up with Dr. Jenkins Rouge. 3. iron deficiency anemia: Hg stable. FOBT  neg. Will monitor and consider transfusion if Hg <7 4. DM 2: uncontrolled. HG AIC 8.4. Start low-dose Lantus, Cont SSI 5. L great toe ulcer: Wound consult 6. HTN: controlled. Continue home meds 7. Hyperlipidemia: continue statin 8. Depression: stable. Continue elavil. 9. Idiopath peripheral neuropathy: baseline. Cont neurontin 10. Dispo: consult CIR      LOS: 2 days   Shamarra Warda 04/24/2011, 11:52 AM

## 2011-04-24 NOTE — Progress Notes (Signed)
UR review completed. 

## 2011-04-25 ENCOUNTER — Encounter (HOSPITAL_COMMUNITY)
Admission: EM | Disposition: A | Discharge status: Rehabilitation Facility | Payer: Self-pay | Source: Home / Self Care | Attending: Internal Medicine

## 2011-04-25 ENCOUNTER — Ambulatory Visit (HOSPITAL_COMMUNITY): Admit: 2011-04-25 | Payer: Self-pay | Admitting: Cardiology

## 2011-04-25 ENCOUNTER — Encounter (HOSPITAL_COMMUNITY): Payer: Self-pay | Admitting: *Deleted

## 2011-04-25 DIAGNOSIS — I6330 Cerebral infarction due to thrombosis of unspecified cerebral artery: Secondary | ICD-10-CM

## 2011-04-25 DIAGNOSIS — I429 Cardiomyopathy, unspecified: Secondary | ICD-10-CM

## 2011-04-25 DIAGNOSIS — I428 Other cardiomyopathies: Secondary | ICD-10-CM

## 2011-04-25 DIAGNOSIS — I6789 Other cerebrovascular disease: Secondary | ICD-10-CM

## 2011-04-25 HISTORY — PX: TEE WITHOUT CARDIOVERSION: SHX5443

## 2011-04-25 LAB — GLUCOSE, CAPILLARY
Glucose-Capillary: 128 mg/dL — ABNORMAL HIGH (ref 70–99)
Glucose-Capillary: 97 mg/dL (ref 70–99)
Glucose-Capillary: 154 mg/dL — ABNORMAL HIGH (ref 70–99)

## 2011-04-25 LAB — C4 COMPLEMENT: Complement C4, Body Fluid: 51 mg/dL — ABNORMAL HIGH (ref 10–40)

## 2011-04-25 LAB — PROTEIN S, TOTAL: Protein S Ag, Total: 121 % (ref 60–150)

## 2011-04-25 LAB — BASIC METABOLIC PANEL
BUN: 37 mg/dL — ABNORMAL HIGH (ref 6–23)
CO2: 21 mEq/L (ref 19–32)
Chloride: 104 mEq/L (ref 96–112)
Creatinine, Ser: 2.04 mg/dL — ABNORMAL HIGH (ref 0.50–1.10)
GFR calc Af Amer: 32 mL/min — ABNORMAL LOW (ref >90)
Glucose, Bld: 148 mg/dL — ABNORMAL HIGH (ref 70–99)
Potassium: 5.7 mEq/L — ABNORMAL HIGH (ref 3.5–5.1)

## 2011-04-25 LAB — LUPUS ANTICOAGULANT PANEL
DRVVT: 43.1 secs — ABNORMAL HIGH (ref 34.1–42.2)
PTT Lupus Anticoagulant: 40.2 secs (ref 28.0–43.0)

## 2011-04-25 LAB — PROTEIN C ACTIVITY: Protein C Activity: 82 % (ref 75–133)

## 2011-04-25 LAB — CBC
HCT: 27.4 % — ABNORMAL LOW (ref 36.0–46.0)
Hemoglobin: 8.6 g/dL — ABNORMAL LOW (ref 12.0–15.0)
MCV: 84.8 fL (ref 78.0–100.0)
RBC: 3.23 MIL/uL — ABNORMAL LOW (ref 3.87–5.11)
WBC: 11.3 10*3/uL — ABNORMAL HIGH (ref 4.0–10.5)

## 2011-04-25 LAB — PROTEIN C, TOTAL: Protein C, Total: 111 % (ref 72–160)

## 2011-04-25 LAB — C3 COMPLEMENT: C3 Complement: 148 mg/dL (ref 90–180)

## 2011-04-25 SURGERY — ECHOCARDIOGRAM, TRANSESOPHAGEAL
Anesthesia: Moderate Sedation

## 2011-04-25 MED ORDER — SODIUM CHLORIDE 0.9 % IV SOLN
INTRAVENOUS | Status: DC
  Administered 2011-04-25: 500 mL via INTRAVENOUS

## 2011-04-25 MED ORDER — BUTAMBEN-TETRACAINE-BENZOCAINE 2-2-14 % EX AERO
INHALATION_SPRAY | CUTANEOUS | Status: DC | PRN
  Administered 2011-04-25: 1 via TOPICAL

## 2011-04-25 MED ORDER — MIDAZOLAM HCL 10 MG/2ML IJ SOLN
INTRAMUSCULAR | Status: DC | PRN
  Administered 2011-04-25: 2 mg via INTRAVENOUS
  Administered 2011-04-25: 1 mg via INTRAVENOUS

## 2011-04-25 MED ORDER — LOSARTAN POTASSIUM 50 MG PO TABS
100.0000 mg | ORAL_TABLET | Freq: Every day | ORAL | Status: DC
  Administered 2011-04-26: 100 mg via ORAL
  Filled 2011-04-25 (×2): qty 2

## 2011-04-25 MED ORDER — MIDAZOLAM HCL 10 MG/2ML IJ SOLN
INTRAMUSCULAR | Status: AC
  Filled 2011-04-25: qty 2

## 2011-04-25 MED ORDER — LIDOCAINE VISCOUS 2 % MT SOLN
OROMUCOSAL | Status: DC | PRN
  Administered 2011-04-25: 4 mL via OROMUCOSAL

## 2011-04-25 MED ORDER — FENTANYL CITRATE 0.05 MG/ML IJ SOLN
INTRAMUSCULAR | Status: AC
  Filled 2011-04-25: qty 2

## 2011-04-25 MED ORDER — LIDOCAINE VISCOUS 2 % MT SOLN
OROMUCOSAL | Status: AC
  Filled 2011-04-25: qty 15

## 2011-04-25 MED ORDER — FENTANYL CITRATE 0.05 MG/ML IJ SOLN
INTRAMUSCULAR | Status: DC | PRN
  Administered 2011-04-25: 25 ug via INTRAVENOUS

## 2011-04-25 NOTE — Consult Note (Signed)
CARDIOLOGY CONSULT NOTE  Patient ID: [name] MRN: [id number] DOB/AGE: April 12, 1961 50 y.o.  Admit date: 04/22/2011 Referring Physician Marye Round Primary 58 A, MD Primary Cardiologist Earnstine Meinders Reason for Consultation Decreased EF on echo with recurrent CVA Principal Problem:  *CVA (cerebral vascular accident) Active Problems:  DIABETES MELLITUS, TYPE II  HYPERLIPIDEMIA  HYPERTENSION  CVA  Cardiomyopathy  HPI:  50 yo obese, HTN female. CVA last year. TEE with normal LV severe LVH. Previous issues with med compliance. Relative tachycardia. Improved with lopresser. Event monitor 2/12 with SR occasional PAC;s no significant arrhtymia. CRF HTN and elevated lipids well controlled. DM less well controlled. Sister helps her with meds. No SSCP, palpitations, dyspnea or edema. Admitted with recurrent CVA.  Previous LUE/LLE weakness worse and worse vision in left eye.  Vision poor in both eyes with recent opthamology visits.  Echo done 11/12 shows continued severe LVH but no EF 35%.  No clinical CHF.  No SSCP.  Continues to have some palpitations but no documented PAF.  Passed swallowing study and is taking puree diet.  ECG with poor R wave progression LAD no change from previous.   Previous ECG;s reviewed with poor R wave progression from body habitus no previous MI.  Carotids done this admission with no disease.    ROS: Denies fever, malais, weight loss, , , cough, sputum, SOB, hemoptysis, pleuritic pain, , heartburn, abdominal pain, melena, lower extremity edema, claudication, or rash.  All other systems reviewed and negative  All other systems reviewed and negative except as noted above  Past Medical History  Diagnosis Date  . HTN (hypertension)   . HLD (hyperlipidemia)   . History of methicillin resistant staphylococcus aureus (MRSA)   . Sleep apnea   . CVA (cerebral infarction)   . Anemia   . Tachycardia   . Obesity   . Renal failure     due to vanc toxicity  .  Osteomyelitis   . Lower back pain     Chronic  . Idiopathic peripheral neuropathy     NOS  . DJD (degenerative joint disease)   . Headache   . GERD (gastroesophageal reflux disease)   . DM type 2 (diabetes mellitus, type 2)   . Depression   . Anxiety   . Hypokalemia     resolved  . Cerebral infarct     left parietal and bilateral  . Esophagitis     distal  . Fluid overload     compensated  . Urinary retention   . H/O: pneumonia   . History of bacteremia   . Surgery, other elective     of the ear  . Stroke     Hx of left frontoparietal stroke in 12/11. History of right brain stroke.    Family History  Problem Relation Age of Onset  . Coronary artery disease    . Diabetes      History   Social History  . Marital Status: Divorced    Spouse Name: N/A    Number of Children: N/A  . Years of Education: N/A   Occupational History  . Not on file.   Social History Main Topics  . Smoking status: Never Smoker   . Smokeless tobacco: Never Used  . Alcohol Use: Yes     rare on holidays  . Drug Use: No  . Sexually Active: Not on file   Other Topics Concern  . Not on file   Social History Narrative   DivorcedOne of 10 children5 in  [place] and was living with sister at time of CVANonsmokerNondrinker    Past Surgical History  Procedure Date  . Tympanoplasty   . Toe amputation     left 2nd toe  . Dilation and curettage of uterus     history     Prescriptions prior to admission  Medication Sig Dispense Refill  . amitriptyline (ELAVIL) 50 MG tablet Take 50 mg by mouth at bedtime. For nerves/rest       . cycloSPORINE (RESTASIS) 0.05 % ophthalmic emulsion Place 1 drop into both eyes 2 (two) times daily.        . ferrous sulfate 325 (65 FE) MG tablet Take 325 mg by mouth 2 (two) times daily.        Marland Kitchen gabapentin (NEURONTIN) 600 MG tablet Take 600 mg by mouth. 4 times per day       . hydrochlorothiazide (,MICROZIDE/HYDRODIURIL,) 12.5 MG capsule Take 12.5 mg by mouth daily.        Marland Kitchen lisinopril (PRINIVIL,ZESTRIL) 20 MG tablet Take 20 mg by mouth 2 (two) times daily.       . metFORMIN (GLUCOPHAGE) 1000 MG tablet Take 1,000 mg by mouth 2 (two) times daily.       . metoprolol (LOPRESSOR) 50 MG tablet Take 50 mg by mouth 2 (two) times daily.       . pravastatin (PRAVACHOL) 40 MG tablet Take 40 mg by mouth at bedtime.          Physical Exam: Blood pressure 148/96, pulse 82, temperature 97.8 F (36.6 C), temperature source Oral, resp. rate 16, height 5\' 7"  (1.702 m), weight 123.2 kg (271 lb 9.7 oz), SpO2 100.00%. Affect appropriate Chronically ill and obese HEENT: normal Neck supple with no adenopathy JVP normal no bruits no thyromegaly Lungs clear with no wheezing and good diaphragmatic motion Heart:  S1/S2 no murmur,rub, gallop or click PMI  Not palpable Abdomen: benighn, BS positve, no tenderness, no AAA no bruit.  No HSM or HJR Distal pulses intact with no bruits Plus one edema Neuro aphasia and left hemiparesis patch on right eye Skin warm and dry    Labs:   Lab Results  Component Value Date   WBC 11.3* 04/25/2011   HGB 8.6* 04/25/2011   HCT 27.4* 04/25/2011   MCV 84.8 04/25/2011   PLT 477* 04/25/2011     Lab 04/25/11 0551 04/22/11 1545  NA 134* --  K 5.7* --  CL 104 --  CO2 21 --  BUN 37* --  CREATININE 2.04* --  CALCIUM 8.8 --  PROT -- 6.5  BILITOT -- 0.2*  ALKPHOS -- 84  ALT -- 7  AST -- 10  GLUCOSE 148* --   Lab Results  Component Value Date   CKTOTAL 63 04/23/2011   CKMB 3.1 04/23/2011   TROPONINI <0.30 04/23/2011    Lab Results  Component Value Date   CHOL 230* 04/23/2011   CHOL  Value: 276        ATP III CLASSIFICATION:  <200     mg/dL   Desirable  200-239  mg/dL   Borderline High  >=240    mg/dL   High       * 06/13/2010   CHOL  Value: 208        ATP III CLASSIFICATION:  <200     mg/dL   Desirable  200-239  mg/dL   Borderline High  >=240    mg/dL   High       * 06/23/2008  Lab Results  Component Value Date   HDL 49  04/23/2011   HDL 39* 06/13/2010   HDL 43 06/23/2008   Lab Results  Component Value Date   LDLCALC 158* 04/23/2011   LDLCALC  Value: 213        Total Cholesterol/HDL:CHD Risk Coronary Heart Disease Risk Table                     Men   Women  1/2 Average Risk   3.4   3.3  Average Risk       5.0   4.4  2 X Average Risk   9.6   7.1  3 X Average Risk  23.4   11.0        Use the calculated Patient Ratio above and the CHD Risk Table to determine the patient's CHD Risk.        ATP III CLASSIFICATION (LDL):  <100     mg/dL   Optimal  100-129  mg/dL   Near or Above                    Optimal  130-159  mg/dL   Borderline  160-189  mg/dL   High  >190     mg/dL   Very High* 06/13/2010   LDLCALC  Value: 151        Total Cholesterol/HDL:CHD Risk Coronary Heart Disease Risk Table                     Men   Women  1/2 Average Risk   3.4   3.3  Average Risk       5.0   4.4  2 X Average Risk   9.6   7.1  3 X Average Risk  23.4   11.0        Use the calculated Patient Ratio above and the CHD Risk Table to determine the patient's CHD Risk.        ATP III CLASSIFICATION (LDL):  <100     mg/dL   Optimal  100-129  mg/dL   Near or Above                    Optimal  130-159  mg/dL   Borderline  160-189  mg/dL   High  >190     mg/dL   Very High* 06/23/2008   Lab Results  Component Value Date   TRIG 116 04/23/2011   TRIG 120 06/13/2010   TRIG 72 06/23/2008   Lab Results  Component Value Date   CHOLHDL 4.7 04/23/2011   CHOLHDL 7.1 06/13/2010   CHOLHDL 4.8 06/23/2008   No results found for this basename: LDLDIRECT      Radiology: Dg Chest 2 View  04/22/2011  *RADIOLOGY REPORT*  Clinical Data: Tachycardia  CHEST - 2 VIEW  Comparison: 06/19/2010  Findings: Cardiomegaly with pulmonary vascular congestion and suspected mild interstitial edema. No pleural effusion or pneumothorax.  Degenerative changes of the visualized thoracolumbar spine.  IMPRESSION: Cardiomegaly with pulmonary vascular congestion and suspected mild interstitial  edema.  Original Report Authenticated By: Julian Hy, M.D.   Ct Head Wo Contrast  04/22/2011  *RADIOLOGY REPORT*  Clinical Data: Slurred speech, left-sided weakness  CT HEAD WITHOUT CONTRAST  Technique:  Contiguous axial images were obtained from the base of the skull through the vertex without contrast.  Comparison: Head MRI 06/13/2010  Findings: There is a cortical infarction in the high left frontal  and parietal lobe which corresponds to prior infarction demonstrated on MRI 06/13/2010.  There is no CT evidence of acute infarction.  No intracranial hemorrhage.  No focal mass lesion.  No midline shift or mass effect.  No hydrocephalus.  Paranasal sinuses and mastoid air cells are clear.  Orbits are normal.  IMPRESSION:  1.  No acute intracranial findings. 2.  Remote left frontoparietal infarction.  Original Report Authenticated By: Suzy Bouchard, M.D.   Mr Angiogram Head Wo Contrast  04/23/2011  *RADIOLOGY REPORT*  Clinical Data:  Slurred speech and left-sided weakness.  MRI HEAD WITHOUT CONTRAST MRA HEAD WITHOUT CONTRAST  Technique:  Multiplanar, multiecho pulse sequences of the brain and surrounding structures were obtained without intravenous contrast. Angiographic images of the head were obtained using MRA technique without contrast.  Comparison:   04/22/2011 CT head.  MRI HEAD  Findings:  The patient was moderately uncooperative and could not remain motionless for the study.  Images are suboptimal.  Small or subtle lesions could be overlooked.  There is a large area of acute infarction affecting the right insular cortex, right posterior frontal cortex, right superior temporal cortex, and underlying white matter.  There is no associated hemorrhage.  There is no hydrocephalus, mass lesion, or midline shift.  No extra-axial fluid collections are seen. There is moderate atrophy with chronic microvascular ischemic change.  There is a large remote left parietal infarct with gliosis and  encephalomalacia.  The carotid and basilar arteries are patent based on flow void signal.  Pituitary and cerebellar tonsils are unremarkable.  There is no sinus or mastoid disease. 1 cm intraparotid lymph node suggested on the right.  Compared to prior CT, the acute infarction is not visible.  IMPRESSION: Large acute right hemisphere MCA territory infarct without hemorrhage or midline shift.  Atrophy and small vessel disease.  MRA HEAD  Findings: Mild nonstenotic irregularity both carotid siphons.  No supraclinoid ICA stenosis.  Basilar artery widely patent vertebrals codominant.  There is no proximal anterior, middle, or posterior cerebral artery stenosis. Mild irregularity of the distal MCA and PCA branches suggests intracranial atherosclerotic change, with a reduced number of M3 vessels on the right as compared to the left, correlating with the distribution of acute infarction.  IMPRESSION: No proximal carotid or basilar stenosis.  Reduced number of M3 vessels into the right middle cerebral artery territory.  Mild irregularity of the distal MCA and PCA branches which are visualized suggest intracranial atherosclerotic change.  Original Report Authenticated By: Staci Righter, M.D.   Mri Brain Without Contrast  04/23/2011  *RADIOLOGY REPORT*  Clinical Data:  Slurred speech and left-sided weakness.  MRI HEAD WITHOUT CONTRAST MRA HEAD WITHOUT CONTRAST  Technique:  Multiplanar, multiecho pulse sequences of the brain and surrounding structures were obtained without intravenous contrast. Angiographic images of the head were obtained using MRA technique without contrast.  Comparison:   04/22/2011 CT head.  MRI HEAD  Findings:  The patient was moderately uncooperative and could not remain motionless for the study.  Images are suboptimal.  Small or subtle lesions could be overlooked.  There is a large area of acute infarction affecting the right insular cortex, right posterior frontal cortex, right superior temporal  cortex, and underlying white matter.  There is no associated hemorrhage.  There is no hydrocephalus, mass lesion, or midline shift.  No extra-axial fluid collections are seen. There is moderate atrophy with chronic microvascular ischemic change.  There is a large remote left parietal infarct with gliosis and encephalomalacia.  The carotid and basilar arteries are patent based on flow void signal.  Pituitary and cerebellar tonsils are unremarkable.  There is no sinus or mastoid disease. 1 cm intraparotid lymph node suggested on the right.  Compared to prior CT, the acute infarction is not visible.  IMPRESSION: Large acute right hemisphere MCA territory infarct without hemorrhage or midline shift.  Atrophy and small vessel disease.  MRA HEAD  Findings: Mild nonstenotic irregularity both carotid siphons.  No supraclinoid ICA stenosis.  Basilar artery widely patent vertebrals codominant.  There is no proximal anterior, middle, or posterior cerebral artery stenosis. Mild irregularity of the distal MCA and PCA branches suggests intracranial atherosclerotic change, with a reduced number of M3 vessels on the right as compared to the left, correlating with the distribution of acute infarction.  IMPRESSION: No proximal carotid or basilar stenosis.  Reduced number of M3 vessels into the right middle cerebral artery territory.  Mild irregularity of the distal MCA and PCA branches which are visualized suggest intracranial atherosclerotic change.  Original Report Authenticated By: Staci Righter, M.D.   Dg Foot Complete Left  04/22/2011  *RADIOLOGY REPORT*  Clinical Data: Great toe wound, evaluate for osteomyelitis  LEFT FOOT - COMPLETE 3+ VIEW  Comparison: MRI dated 06/27/2008  Findings: Soft tissue injury along the medial aspect of the first distal phalanx.  No definite underlying cortical irregularity to suggest osteomyelitis.  Prior resection of the second ray.  No fracture or dislocation is seen.  Plantar and posterior  calcaneal enthesopathy.  Vascular calcifications.  IMPRESSION: Soft tissue injury along the medial aspect of the first distal phalanx.  No radiographic evidence of osteomyelitis.  Original Report Authenticated By: Julian Hy, M.D.   EKG: SR poor R wave progression LAD no change from previous  ASSESSMENT AND PLAN:  Cardiomyopathy:  Not likely ischemic with no ECG changes and cardiac panel negative x 2.  D/C lisinopril.  Start Hyzaar Sister indicates she gets SOB with with ACE.  Adding low dose diuretic with decreased EF.  F/U EF evaluation in 6 months CVA:  No documented afib.  No carotid disease.  Will leave up to neuro issues of anticoagulation.  Sister ok with SNF on D/C HTN:  Well controlled continue current meds DM:  Target A1c less than 6.5 SS coverage  Signed: Jenkins Rouge 6:02 PM  04/25/2011, 5:51 PM

## 2011-04-25 NOTE — Progress Notes (Addendum)
Speech Language/Pathology SLP Cancellation Note  Unable to complete SLP evaluation/treatment secondary to pt in procedure.   Therapist: Herbie [place], [place] CCC-SLP [phone]

## 2011-04-25 NOTE — Progress Notes (Signed)
I met with patient and her sister, Mackie Pai, at bedside. Patient previously on inpatient rehabilitation after her CVA last year. Returned home with her sister and had progressed well to Mod I to supervision level. I discussed the increased physical care that patient would have even after another inpatient rehabilitation stay. Sister wants to see how patient does and will discuss with other siblings the additional support that patient will need at home. I await medical work up completion, and then can admit patient to acute inpatient rehabilitation. ? Tomorrow. I will follow up in the a.m. Please call me with questions. Pager [phone].

## 2011-04-25 NOTE — Progress Notes (Signed)
PT Cancellation Note  ___Treatment cancelled today due to medical issues with patient which prohibited therapy  _x__ Treatment cancelled today due to patient receiving procedure or test: pt at TEE   ___ Treatment cancelled today due to patient's refusal to participate   ___ Treatment cancelled today due to   Signature: Strummer Canipe B. Boyd, [place], DPT [phone]

## 2011-04-25 NOTE — Progress Notes (Signed)
Subjective: Just back from TEE. Sedated still     Physical Exam: Blood pressure 148/96, pulse 82, temperature 97.8 F (36.6 C), temperature source Oral, resp. rate 16, height 5\' 7"  (1.702 m), weight 123.2 kg (271 lb 9.7 oz), SpO2 100.00%. Arousable. CVS: RRR, S3 gallop. RS: clear ant,  flacid left hemiplegia Abdomen : soft, NT   Investigations: Results for orders placed during the hospital encounter of 04/22/11 (from the past 48 hour(s))  GLUCOSE, CAPILLARY     Status: Abnormal   Collection Time   04/23/11 11:15 PM      Component Value Range Comment   Glucose-Capillary 182 (*) 70 - 99 (mg/dL)    Comment 1 Documented in Chart      Comment 2 Notify RN     CBC     Status: Abnormal   Collection Time   04/24/11  6:25 AM      Component Value Range Comment   WBC 7.9  4.0 - 10.5 (K/uL)    RBC 3.26 (*) 3.87 - 5.11 (MIL/uL)    Hemoglobin 8.6 (*) 12.0 - 15.0 (g/dL)    HCT 27.7 (*) 36.0 - 46.0 (%)    MCV 85.0  78.0 - 100.0 (fL)    MCH 26.4  26.0 - 34.0 (pg)    MCHC 31.0  30.0 - 36.0 (g/dL)    RDW 15.8 (*) 11.5 - 15.5 (%)    Platelets 409 (*) 150 - 400 (K/uL)   BASIC METABOLIC PANEL     Status: Abnormal   Collection Time   04/24/11  6:25 AM      Component Value Range Comment   Sodium 136  135 - 145 (mEq/L)    Potassium 5.5 (*) 3.5 - 5.1 (mEq/L)    Chloride 106  96 - 112 (mEq/L)    CO2 20  19 - 32 (mEq/L)    Glucose, Bld 178 (*) 70 - 99 (mg/dL)    BUN 31 (*) 6 - 23 (mg/dL)    Creatinine, Ser 1.74 (*) 0.50 - 1.10 (mg/dL)    Calcium 8.8  8.4 - 10.5 (mg/dL)    GFR calc non Af Amer 34 (*) >90 (mL/min)    GFR calc Af Amer 39 (*) >90 (mL/min)   GLUCOSE, CAPILLARY     Status: Abnormal   Collection Time   04/24/11  7:07 AM      Component Value Range Comment   Glucose-Capillary 156 (*) 70 - 99 (mg/dL)   GLUCOSE, CAPILLARY     Status: Abnormal   Collection Time   04/24/11 11:26 AM      Component Value Range Comment   Glucose-Capillary 150 (*) 70 - 99 (mg/dL)   ANTITHROMBIN III      Status: Normal   Collection Time   04/24/11 12:05 PM      Component Value Range Comment   AntiThromb III Func 98  75 - 120 (%)   PROTEIN C ACTIVITY     Status: Normal   Collection Time   04/24/11 12:05 PM      Component Value Range Comment   Protein C Activity 82  75 - 133 (%)   PROTEIN C, TOTAL     Status: Normal   Collection Time   04/24/11 12:05 PM      Component Value Range Comment   Protein C, Total 111  72 - 160 (%)   PROTEIN S ACTIVITY     Status: Abnormal   Collection Time   04/24/11 12:05 PM  Component Value Range Comment   Protein S Activity 67 (*) 69 - 129 (%)   PROTEIN S, TOTAL     Status: Normal   Collection Time   04/24/11 12:05 PM      Component Value Range Comment   Protein S Ag, Total 121  60 - 150 (%)   LUPUS ANTICOAGULANT PANEL     Status: Abnormal   Collection Time   04/24/11 12:05 PM      Component Value Range Comment   PTT Lupus Anticoagulant 40.2  28.0 - 43.0 (secs)    PTTLA Confirmation NOT APPL  <8.0 (secs)    PTTLA 4:1 Mix NOT APPL  28.0 - 43.0 (secs)    Drvvt 43.1 (*) 34.1 - 42.2 (secs)    Drvvt confirmation NOT APPL  <1.16 (Ratio)    dRVVT Incubated 1:1 Mix 37.2  34.1 - 42.2 (secs)    Lupus Anticoagulant NOT DETECTED  NOT DETECTED    HOMOCYSTEINE, SERUM     Status: Normal   Collection Time   04/24/11 12:05 PM      Component Value Range Comment   Homocysteine-Norm 12.0  4.0 - 15.4 (umol/L)   C3 COMPLEMENT     Status: Normal   Collection Time   04/24/11 12:05 PM      Component Value Range Comment   C3 Complement 148  90 - 180 (mg/dL)   C4 COMPLEMENT     Status: Abnormal   Collection Time   04/24/11 12:05 PM      Component Value Range Comment   Complement C4, Body Fluid 51 (*) 10 - 40 (mg/dL)   ANA     Status: Normal   Collection Time   04/24/11 12:05 PM      Component Value Range Comment   ANA NEGATIVE  NEGATIVE    SEDIMENTATION RATE     Status: Abnormal   Collection Time   04/24/11 12:05 PM      Component Value Range Comment    Sed Rate 130 (*) 0 - 22 (mm/hr)   RPR     Status: Normal   Collection Time   04/24/11 12:05 PM      Component Value Range Comment   RPR NON REACTIVE  NON REACTIVE    GLUCOSE, CAPILLARY     Status: Abnormal   Collection Time   04/24/11  4:34 PM      Component Value Range Comment   Glucose-Capillary 168 (*) 70 - 99 (mg/dL)    Comment 1 Notify RN     GLUCOSE, CAPILLARY     Status: Abnormal   Collection Time   04/24/11  9:50 PM      Component Value Range Comment   Glucose-Capillary 158 (*) 70 - 99 (mg/dL)   CBC     Status: Abnormal   Collection Time   04/25/11  5:51 AM      Component Value Range Comment   WBC 11.3 (*) 4.0 - 10.5 (K/uL)    RBC 3.23 (*) 3.87 - 5.11 (MIL/uL)    Hemoglobin 8.6 (*) 12.0 - 15.0 (g/dL)    HCT 27.4 (*) 36.0 - 46.0 (%)    MCV 84.8  78.0 - 100.0 (fL)    MCH 26.6  26.0 - 34.0 (pg)    MCHC 31.4  30.0 - 36.0 (g/dL)    RDW 15.9 (*) 11.5 - 15.5 (%)    Platelets 477 (*) 150 - 400 (K/uL)   BASIC METABOLIC PANEL     Status: Abnormal  Collection Time   04/25/11  5:51 AM      Component Value Range Comment   Sodium 134 (*) 135 - 145 (mEq/L)    Potassium 5.7 (*) 3.5 - 5.1 (mEq/L)    Chloride 104  96 - 112 (mEq/L)    CO2 21  19 - 32 (mEq/L)    Glucose, Bld 148 (*) 70 - 99 (mg/dL)    BUN 37 (*) 6 - 23 (mg/dL)    Creatinine, Ser 2.04 (*) 0.50 - 1.10 (mg/dL)    Calcium 8.8  8.4 - 10.5 (mg/dL)    GFR calc non Af Amer 28 (*) >90 (mL/min)    GFR calc Af Amer 32 (*) >90 (mL/min)   GLUCOSE, CAPILLARY     Status: Abnormal   Collection Time   04/25/11  6:53 AM      Component Value Range Comment   Glucose-Capillary 142 (*) 70 - 99 (mg/dL)   GLUCOSE, CAPILLARY     Status: Abnormal   Collection Time   04/25/11 11:02 AM      Component Value Range Comment   Glucose-Capillary 128 (*) 70 - 99 (mg/dL)   GLUCOSE, CAPILLARY     Status: Normal   Collection Time   04/25/11  4:28 PM      Component Value Range Comment   Glucose-Capillary 97  70 - 99 (mg/dL)    Recent  Results (from the past 240 hour(s))  MRSA PCR SCREENING     Status: Normal   Collection Time   04/22/11 10:29 PM      Component Value Range Status Comment   MRSA by PCR NEGATIVE  NEGATIVE  Final     No results found.    Medications: I have reviewed the patient's current medications.  Impression/Plan:  Principal Problem:  *CVA (cerebral vascular accident) Active Problems:  DIABETES MELLITUS, TYPE II  HYPERLIPIDEMIA  HYPERTENSION  CVA  Cardiomyopathy TEE shows severley depressed EF which is new compared to prior studies. Etiology could be hypertension but I would recommend cardiology consult.  I will discuss with Dr. Leonie Man tomorrow if we should use Coumadin for secondary stroke prevention.  Hopefully she can go to CIR in 1-2 days.   LOS: 3 days   Socrates Cahoon 04/25/2011, 6:01 PM

## 2011-04-25 NOTE — Interval H&P Note (Signed)
History and Physical Interval Note:   04/25/2011   2:34 PM   [name]  has presented today for surgery, with the diagnosis of CVA  The various methods of treatment have been discussed with the patient and family. After consideration of risks, benefits and other options for treatment, the patient has consented to  Procedure(s): TRANSESOPHAGEAL ECHOCARDIOGRAM (TEE) as a surgical intervention .  The patients' history has been reviewed, patient examined, no change in status, stable for surgery.  I have reviewed the patients' chart and labs.  Questions were answered to the patient's satisfaction.     Glori Bickers  MD

## 2011-04-25 NOTE — PMR Pre-admission (Signed)
PMR Admission Coordinator Pre-Admission Assessment  Patient:  [name] is an 50 y.o., female MRN:  [id number] DOB:  1960-06-26 Height:  Height: 5\' 7"  (170.2 cm) Weight:  Weight: 123.2 kg (271 lb 9.7 oz)  SS# [id number]  Insurance Information:  [id number]      Policy#:[id number] ( we have listed [id number] O)      Subscriber:pt Benefits:  Phone [phone]     Name:automated Eff. Date:active 04/25/11     Aid to the disabled class  Current Medical History:   Patient Admitting Diagnosis:   Large Right MCA Infarct    History of Present Illness: Admitted 04/23/11 with increased left sided weakness and facial droop.  MRI with a large r MCA cva without hemmorhage. Placed on aspirin by neurology and lovenox. Hgb A1c is 8.4.  TEE 04/25/11  with depressed EF. Cardiology consulted for cardiomyopathy with medication changes noted.  Patients Past Medical History:   Past Medical History  Diagnosis Date  . HTN (hypertension)   . HLD (hyperlipidemia)   . History of methicillin resistant staphylococcus aureus (MRSA)   . Sleep apnea   . CVA (cerebral infarction)   . Anemia   . Tachycardia   . Obesity   . Renal failure     due to vanc toxicity  . Osteomyelitis   . Lower back pain     Chronic  . Idiopathic peripheral neuropathy     NOS  . DJD (degenerative joint disease)   . Headache   . GERD (gastroesophageal reflux disease)   . DM type 2 (diabetes mellitus, type 2)   . Depression   . Anxiety   . Hypokalemia     resolved  . Cerebral infarct     left parietal and bilateral  . Esophagitis     distal  . Fluid overload     compensated  . Urinary retention   . H/O: pneumonia   . History of bacteremia   . Surgery, other elective     of the ear  . Stroke     Hx of left frontoparietal stroke in 12/11. History of right brain stroke.   Family Medical History:  family history includes Coronary artery disease in an unspecified family member and Diabetes in an unspecified  family member. NIH Stroke scale:   NIH 24 to 19 since admission :  Height and Weight Height: 5\' 7"  (170.2 cm) Weight: 123.2 kg (271 lb 9.7 oz) BSA (Calculated - sq m): 2.41 sq meters BMI (Calculated): 42.6  Weight in (lb) to have BMI = 25: 159.3       Prior Rehab/Hospitalizations: [facility] 2011  Medications PTA Medications:   Medications Prior to Admission  Medication Dose Route Frequency Provider Last Rate Last Dose  . amitriptyline (ELAVIL) tablet 50 mg  50 mg Oral QHS Daniel Thompson   50 mg at 04/25/11 2225  . aspirin tablet 325 mg  325 mg Oral Daily Irine Seal   325 mg at 04/24/11 1509  . cycloSPORINE (RESTASIS) 0.05 % ophthalmic emulsion 1 drop  1 drop Both Eyes BID Irine Seal   1 drop at 04/25/11 2227  . enoxaparin (LOVENOX) injection 40 mg  40 mg Subcutaneous Q24H Christian M Butte, PHARMD   40 mg at 04/25/11 2239  . ferrous sulfate tablet 325 mg  325 mg Oral BID Irine Seal   325 mg at 04/25/11 2227  . furosemide (LASIX) tablet 20 mg  20 mg Oral Daily Sorin Laza      . gabapentin (NEURONTIN) capsule  600 mg  600 mg Oral Q6H Daniel Thompson   600 mg at 04/26/11 0600  . HYDROmorphone (DILAUDID) injection 0.5 mg  0.5 mg Intravenous Once Blanchie Dessert, MD   0.5 mg at 04/22/11 1709  . insulin aspart (novoLOG) injection 0-15 Units  0-15 Units Subcutaneous TID WC Irine Seal   2 Units at 04/26/11 [id number]  . losartan (COZAAR) tablet 100 mg  100 mg Oral Daily Josue Hector, MD      . metoprolol tartrate (LOPRESSOR) tablet 75 mg  75 mg Oral BID Irine Seal   75 mg at 04/25/11 2225  . simvastatin (ZOCOR) tablet 20 mg  20 mg Oral q1800 Irine Seal   20 mg at 04/24/11 1815  . DISCONTD: 0.9 %  sodium chloride infusion   Intravenous Continuous Irine Seal 75 mL/hr at 04/24/11 0953    . DISCONTD: 0.9 %  sodium chloride infusion   Intravenous Continuous Jolaine Artist, MD 20 mL/hr at 04/25/11 1408 500 mL at 04/25/11 1408  . DISCONTD: acetaminophen (TYLENOL)  tablet 650 mg  650 mg Oral Q4H PRN Irine Seal   650 mg at 04/23/11 0845  . DISCONTD: butamben-tetracaine-benzocaine (CETACAINE) spray    PRN Jolaine Artist, MD   1 spray at 04/25/11 1434  . DISCONTD: enoxaparin (LOVENOX) injection 30 mg  30 mg Subcutaneous Q24H Irine Seal   30 mg at 04/23/11 2343  . DISCONTD: fentaNYL (SUBLIMAZE) injection    PRN Jolaine Artist, MD   25 mcg at 04/25/11 1435  . DISCONTD: gabapentin (NEURONTIN) tablet 600 mg  600 mg Oral Q6H Irine Seal      . DISCONTD: hydrochlorothiazide (MICROZIDE) capsule 12.5 mg  12.5 mg Oral Daily Irine Seal   12.5 mg at 04/24/11 1509  . DISCONTD: lidocaine (XYLOCAINE) 2 % viscous mouth solution    PRN Jolaine Artist, MD   4 mL at 04/25/11 1433  . DISCONTD: lisinopril (PRINIVIL,ZESTRIL) tablet 20 mg  20 mg Oral BID Irine Seal   20 mg at 04/24/11 2202  . DISCONTD: midazolam (VERSED) 10 MG/2ML injection    PRN Jolaine Artist, MD   1 mg at 04/25/11 1444   Medications Prior to Admission  Medication Sig Dispense Refill  . amitriptyline (ELAVIL) 50 MG tablet Take 50 mg by mouth at bedtime. For nerves/rest       . cycloSPORINE (RESTASIS) 0.05 % ophthalmic emulsion Place 1 drop into both eyes 2 (two) times daily.        . ferrous sulfate 325 (65 FE) MG tablet Take 325 mg by mouth 2 (two) times daily.        Marland Kitchen gabapentin (NEURONTIN) 600 MG tablet Take 600 mg by mouth. 4 times per day       . hydrochlorothiazide (,MICROZIDE/HYDRODIURIL,) 12.5 MG capsule Take 12.5 mg by mouth daily.       Marland Kitchen lisinopril (PRINIVIL,ZESTRIL) 20 MG tablet Take 20 mg by mouth 2 (two) times daily.       . metFORMIN (GLUCOPHAGE) 1000 MG tablet Take 1,000 mg by mouth 2 (two) times daily.       . metoprolol (LOPRESSOR) 50 MG tablet Take 50 mg by mouth 2 (two) times daily.       . pravastatin (PRAVACHOL) 40 MG tablet Take 40 mg by mouth at bedtime.         Current Medications: Current facility-administered medications:amitriptyline (ELAVIL)  tablet 50 mg, 50 mg, Oral, QHS, Irine Seal, 50 mg at 04/25/11 2225;  aspirin tablet  325 mg, 325 mg, Oral, Daily, Irine Seal, 325 mg at 04/24/11 1509;  cycloSPORINE (RESTASIS) 0.05 % ophthalmic emulsion 1 drop, 1 drop, Both Eyes, BID, Irine Seal, 1 drop at 04/25/11 2227 enoxaparin (LOVENOX) injection 40 mg, 40 mg, Subcutaneous, Q24H, Christian M Buettner, PHARMD, 40 mg at 04/25/11 2239;  ferrous sulfate tablet 325 mg, 325 mg, Oral, BID, Irine Seal, 325 mg at 04/25/11 2227;  furosemide (LASIX) tablet 20 mg, 20 mg, Oral, Daily, Sorin Laza;  gabapentin (NEURONTIN) capsule 600 mg, 600 mg, Oral, Q6H, Irine Seal, 600 mg at 04/26/11 0600 insulin aspart (novoLOG) injection 0-15 Units, 0-15 Units, Subcutaneous, TID WC, Irine Seal, 2 Units at 04/26/11 [id number];  losartan (COZAAR) tablet 100 mg, 100 mg, Oral, Daily, Josue Hector, MD;  metoprolol tartrate (LOPRESSOR) tablet 75 mg, 75 mg, Oral, BID, Irine Seal, 75 mg at 04/25/11 2225;  simvastatin (ZOCOR) tablet 20 mg, 20 mg, Oral, q1800, Irine Seal, 20 mg at 04/24/11 1815 DISCONTD: 0.9 %  sodium chloride infusion, , Intravenous, Continuous, Jolaine Artist, MD, Last Rate: 20 mL/hr at 04/25/11 1408, 500 mL at 04/25/11 1408;  DISCONTD: acetaminophen (TYLENOL) tablet 650 mg, 650 mg, Oral, Q4H PRN, Irine Seal, 650 mg at 04/23/11 0845;  DISCONTD: butamben-tetracaine-benzocaine (CETACAINE) spray, , , PRN, Jolaine Artist, MD, 1 spray at 04/25/11 1434 DISCONTD: fentaNYL (SUBLIMAZE) injection, , , PRN, Jolaine Artist, MD, 25 mcg at 04/25/11 1435;  DISCONTD: hydrochlorothiazide (MICROZIDE) capsule 12.5 mg, 12.5 mg, Oral, Daily, Irine Seal, 12.5 mg at 04/24/11 1509;  DISCONTD: lidocaine (XYLOCAINE) 2 % viscous mouth solution, , , PRN, Jolaine Artist, MD, 4 mL at 04/25/11 1433 DISCONTD: lisinopril (PRINIVIL,ZESTRIL) tablet 20 mg, 20 mg, Oral, BID, Irine Seal, 20 mg at 04/24/11 2202;  DISCONTD: midazolam (VERSED) 10  MG/2ML injection, , , PRN, Jolaine Artist, MD, 1 mg at 04/25/11 1444  Precautions/Special Needs:    Additional Precautions/Restrictions: Precautions Precautions: Fall Required Braces or Orthoses: No Restrictions Weight Bearing Restrictions: No  Therapy Assessments; Current: Cognition Arousal/Alertness: Awake/alert Overall Cognitive Status: Impaired Orientation Level: Oriented to person;Oriented to place;Disoriented to time Safety/Judgement: Decreased awareness of safety precautions;Decreased safety judgement for tasks assessed Decreased Safety/Judgement: Decreased awareness of need for assistance Safety/Judgement - Other Comments: Pt. stated she feels she can walk safely with RW Cognition Overall Cognitive Status: Impaired Arousal/Alertness: Awake/alert Orientation Level: Oriented to person;Oriented to place;Disoriented to time Attention: Sustained Sustained Attention: Impaired Sustained Attention Impairment: Verbal basic;Functional basic (Max assist) Memory: Impaired Memory Impairment: Storage deficit;Retrieval deficit;Decreased recall of new information Awareness: Impaired Awareness Impairment: Intellectual impairment (Poor awareness of physical impairments) Problem Solving: Impaired Problem Solving Impairment: Functional basic Executive Function:  (TBA) Behaviors: Perseveration;Other (comment) (Apathy, impersistance, flat affect) Safety/Judgment: Impaired   PTA level: Home Living Lives With: Other (Comment) (Sister lives with her) Receives Help From: Family Type of Home: House Home Layout: One level Home Access: Stairs to enter Entrance Stairs-Rails: Right Entrance Stairs-Number of Steps: 5 Bathroom Toilet: Standard Home Adaptive Equipment: Bedside commode/3-in-1;Walker - rolling Additional Comments: Pt. states she was bathing up at sink (sit->stand) from 3n1 with sisters help PTA Prior Function Level of Independence: Requires assistive device for  independence;Needs assistance with gait;Needs assistance with tranfers;Needs assistance with ADLs Bath: Minimal Able to Take Stairs?: Yes Driving: No Vocation: Unemployed Comments: able to do laundry, has progressed well Sensation Light Touch: Impaired by gross assessment Proprioception: Impaired by gross assessment Additional Comments: L LE Coordination Gross Motor Movements are Fluid and Coordinated: No Fine Motor Movements are Fluid  and Coordinated: No Coordination and Movement Description: Impaired toe tap Finger Nose Finger Test: impaired - pt grossly unable to gaze to midline   ADLs/Mobility: Current P.T. And O.T.  Currently has a foley catheter ADL Eating/Feeding: +1 Total assistance;Simulated Where Assessed - Eating/Feeding: Bed level  Bed Mobility Bed Mobility: Yes Rolling Right: 1: +2 Total assist;Patient percentage (comment) (+2 total assist (pt=20%)) Rolling Right Details (indicate cue type and reason): Assist to flex left LE and bring left UE across chest.  Facilitation to rotate trunk.  Max cues for sequence. Rolling Left: 1: +2 Total assist;Patient percentage (comment) (+2 total assist (pt=35%)) Rolling Left Details (indicate cue type and reason): Assist to flex left LE and to position left UE in a protected position.  Facilitation to trunk to rotate.  Max cues for sequence. Supine to Sit: 1: +2 Total assist;Patient percentage (comment);HOB elevated (Comment degrees) (+2 total assist (pt=30%); HOB 45 degrees.) Supine to Sit Details (indicate cue type and reason): Assist to trunk to facilitate anterior translation and to bilateral LEs to move to EOB.  Max cues for sequence. Sit to Supine - Left: 1: +2 Total assist;Patient percentage (comment);HOB flat (+2 total assist (pt=20%)) Sit to Supine - Left Details (indicate cue type and reason): Assist to slow descent of trunk to bed and for bilateral LEs to translate onto bed.  Max cues for sequence. Transfers Transfers:  No Ambulation/Gait Ambulation/Gait: No Stairs: No Wheelchair Mobility Wheelchair Mobility: No Posture/Postural Control Posture/Postural Control: Postural limitations Postural Limitations: Pt with significant left lean and impaired posture, collapsing trunk forward. Max manual and verbal cues for upright and midline, decreasing to Mod A. Possibly pushing L. Diplopia present as well as decreased visual tracking right to left.  Only able to reach center with eyes (ie-left neglect). Balance Balance Assessed: Yes Static Sitting Balance Static Sitting - Balance Support: Right upper extremity supported;Feet supported Static Sitting - Level of Assistance: 2: Max assist;Other (comment) (See postural limitations section.) Static Sitting - Comment/# of Minutes: 10  Home Assistive Devices/Equipment:  Home Assistive Devices/Equipment Home Assistive Devices/Equipment: Cane  Discharge Planning:  Discharge Planning Living Arrangements: Family members (sister) Support Systems: Children;Family members Assistance Needed: mod assist needed Do you have any problems obtaining your medications?: No Type of Residence: Private residence Home Care Services: No (sister asking about pcs and caps services) Patient expects to be discharged to:: home Expected Discharge Date:  (2 to 3 days) Case Management Consult Needed: Yes (Comment)  Prior Functional Levels:  Prior Functional Level Bed Mobility: independent Transfers: mod I Mobility - Walk/Wheelchair: Mod I RW Upper Body Dressing: Independent Lower Body Dressing: independent Grooming: independent Eating/Drinking: independent Toilet Transfer: independent Bladder Continence: independent Bowel Management: independent Stair Climbing: no Communication: independent Memory: independent Cooking/Meal Prep: sister Housework: Chief Operating Officer Management: sister Driving: no   Previous Environmental health practitioner:  Previous Wellsite geologist: Family  members (sister) Support Systems: Children;Family members Assistance Needed: mod assist needed Do you have any problems obtaining your medications?: No Type of Residence: Private residence Home Care Services: No (sister asking about pcs and caps services) Patient expects to be discharged to:: home Expected Discharge Date:  (2 to 3 days) Home Environment Number of Levels: one level Previous Home Environment Number of Steps: one Previous Home Environment Is Bedroom on Main Floor?: Yes Previous Home Environment Is Bathroom on Main Floor?: Yes   Discharge Living Setting:  Discharge Living Setting Plans for Discharge Living Setting: Patient's home Discharge Living Setting Number of Levels: one level Discharge  Living Setting Number of Steps: few Discharge Living Setting is Bedroom on Main Floor?: Yes Discharge Living Setting is Bathroom on Main Floor?: Yes   Social/Family/Support Systems:  Social/Family/Support Systems Patient Roles: Parent Contact Information: Tunesia Dettman is sister Anticipated Caregiver: sister, son, and family Anticipated Caregiver's Contact Information: [phone] Ability/Limitations of Caregiver: min assist to mod assist (sister is to discuss with family additional physical assist ) Caregiver Availability: 24/7 Discharge Plan Discussed with Primary Caregiver: Yes Is Caregiver In Agreement with Plan?: Yes Does Caregiver/Family have Issues with Lodging/Transportation while Pt is in Rehab?: No   Goals/Additional Needs:  Goals/Additional Needs Patient/Family Goal for Rehab: mod assist P.T. adn O.T.  (ELOS 2 to 3 weeks) Pt/Family Agrees to Admission and willing to participate: Yes Program Orientation Provided & Reviewed with Pt/Caregiver Including Roles  & Responsibilities: Yes  Preadmission Screen Completed By:  Cleatrice Burke, 04/26/2011 10:03 AM  Patient's condition:  This patient's condition remains as documented in the Consult dated 04/25/2011,  in which the Rehabilitation Physician determined and documented that the patient's condition is appropriate for intensive rehabilitative care in an inpatient rehabilitation facility.  Preadmission Screen Competed by:Danne Baxter, RN, Time/Date,04/26/2011 at 1000.    Admission Coordinator:  Cleatrice Burke, time 1000 /Date11/15/2012  .

## 2011-04-25 NOTE — Progress Notes (Signed)
Stroke Team Progress Note  SUBJECTIVE No complaints.no changes noted overnight  OBJECTIVE Most recent Vital Signs: Temp: 98.5 F (36.9 C) (11/14 0539) Temp src: Oral (11/14 0539) BP: 133/97 mmHg (11/14 0539) Pulse Rate: 84  (11/14 0539) Respiratory Rate: 24  Intake/Output from previous day: 11/13 0701 - 11/14 0700 In: 65 [P.O.:50] Out: 300 [Urine:300]   Diet: NPO for TEE  Activity: Up with assistance  DVT Prophylaxis:  Lovenox 40 mg sq daily   Physical Exam:  Obese young African American lady not in distress. Awake, alert, oriented x3. Speech and language appear normal. She's able to name and repeat quite well. She has right gaze preference but is able to look to the left. There is dense left homonymous hemianopsia. There is minimal left lower facial symmetry. She has dense left upper extremity plegia with 0/5 strength. Left lower extremity strength is 2-3/5. There is mild left hemi-neglect and hemisensory loss. Right plantar is downgoing left is upgoing.  ASSESSMENT Patient Active Problem List  Diagnoses  . DIABETES MELLITUS, TYPE II  . HYPERLIPIDEMIA  . ANXIETY  . DEPRESSION  . NEUROPATHY, IDIOPATHIC PERIPHERAL NOS  . GERD  . DEGENERATIVE JOINT DISEASE  . Lumbago  . HEADACHE  . MRSA  . OBESITY  . ANEMIA  . HYPERTENSION  . CVA  . DVT  . RENAL FAILURE  . OSTEOMYELITIS  . SLEEP APNEA  . TACHYCARDIA  . CVA (cerebral vascular accident)  . Tachycardia   Ms. Silvestra Kate is a 50 y.o. female with a history of embolic stroke Jan 0000000, source not found. Now with with fluctuating slurred speech and left-sided weakness which has worsened since admission. MRI shows a right frontal middle cerebral artery branch infarct. She has multiple uncontrolled wrist factors of hypertension, diabetes, hyperlipidemia and a basically.  Hospital day # 3  TREATMENT/PLAN  Agree with CIR consult. TEE today. Follow up hypercoag and vasculitis labs.discuss with sister and answered  questions.  Steffanie Rainwater, ANP-BC, GNP-BC [facility] Pager: [phone] 04/25/2011 8:14 AM

## 2011-04-25 NOTE — Consult Note (Signed)
Physical Medicine and Rehabilitation Consult Reason for Consult:stroke Referring Phsyician: triad [name] is an 50 y.o. female.   HPI: 50 year old female with a history of hypertension as well as cerebrovascular accident in December of 2011 with residual aphasia and left-sided weakness. She was admitted 04/22/2011 with increased left-sided weakness and facial droop. Cranial CT scan was negative for acute changes with remote left frontal parietal infarction. MRI of the brain showed a large acute right hemispheric middle cerebral artery infarction without hemorrhage. MRA of the head was negative for stenosis. Echocardiogram with ejection fraction 30-35% without significant abnormalities. Carotid Dopplers were negative for internal carotid artery stenosis. Seen by neurology services placed on aspirin as well as subcutaneous Lovenox. Noted history of diabetes mellitus with hemoglobin A1c of 8.4 and full diabetic teaching ongoing. She currently requires total assistance for bed mobility transfers are total assist and felt patient would need lift chairs for her mobility. Ambulation has yet to be tested. Inpatient rehabilitation services work was consulted at the request of physical and occupational therapy for consideration of inpatient rehabilitation services.  Review of Systems  Constitutional: Positive for malaise/fatigue.  Eyes: Negative for blurred vision.  Respiratory: Negative for cough and shortness of breath.   Cardiovascular: Negative for chest pain and orthopnea.  Gastrointestinal: Positive for constipation. Negative for nausea.  Genitourinary: Negative for dysuria.  Musculoskeletal: Positive for myalgias.  Skin: Negative.   Neurological: Positive for speech change and headaches. Negative for dizziness and seizures.  Psychiatric/Behavioral: Negative for depression.   Past Medical History  Diagnosis Date  . HTN (hypertension)   . HLD (hyperlipidemia)   . History of methicillin  resistant staphylococcus aureus (MRSA)   . Sleep apnea   . CVA (cerebral infarction)   . Anemia   . Tachycardia   . Obesity   . Renal failure     due to vanc toxicity  . Osteomyelitis   . Lower back pain     Chronic  . Idiopathic peripheral neuropathy     NOS  . DJD (degenerative joint disease)   . Headache   . GERD (gastroesophageal reflux disease)   . DM type 2 (diabetes mellitus, type 2)   . Depression   . Anxiety   . Hypokalemia     resolved  . Cerebral infarct     left parietal and bilateral  . Esophagitis     distal  . Fluid overload     compensated  . Urinary retention   . H/O: pneumonia   . History of bacteremia   . Surgery, other elective     of the ear  . Stroke     Hx of left frontoparietal stroke in 12/11. History of right brain stroke.   Past Surgical History  Procedure Date  . Tympanoplasty   . Toe amputation     left 2nd toe  . Dilation and curettage of uterus     history   Family History  Problem Relation Age of Onset  . Coronary artery disease    . Diabetes     Social History:  reports that she has never smoked. She has never used smokeless tobacco. She reports that she drinks alcohol. She reports that she does not use illicit drugs. Allergies:  Allergies  Allergen Reactions  . Vancomycin Other (See Comments)    Unknown    Medications Prior to Admission  Medication Dose Route Frequency Provider Last Rate Last Dose  . acetaminophen (TYLENOL) tablet 650 mg  650 mg Oral Q4H PRN  Irine Seal   650 mg at 04/23/11 0845  . amitriptyline (ELAVIL) tablet 50 mg  50 mg Oral QHS Daniel Thompson   50 mg at 04/24/11 2202  . aspirin tablet 325 mg  325 mg Oral Daily Irine Seal   325 mg at 04/24/11 1509  . cycloSPORINE (RESTASIS) 0.05 % ophthalmic emulsion 1 drop  1 drop Both Eyes BID Irine Seal   1 drop at 04/24/11 2203  . enoxaparin (LOVENOX) injection 40 mg  40 mg Subcutaneous Q24H Christian M [place], PHARMD   40 mg at 04/24/11 2202  .  ferrous sulfate tablet 325 mg  325 mg Oral BID Irine Seal   325 mg at 04/24/11 2202  . gabapentin (NEURONTIN) capsule 600 mg  600 mg Oral Q6H Daniel Thompson   600 mg at 04/25/11 0536  . hydrochlorothiazide (MICROZIDE) capsule 12.5 mg  12.5 mg Oral Daily Irine Seal   12.5 mg at 04/24/11 1509  . HYDROmorphone (DILAUDID) injection 0.5 mg  0.5 mg Intravenous Once Blanchie Dessert, MD   0.5 mg at 04/22/11 1709  . insulin aspart (novoLOG) injection 0-15 Units  0-15 Units Subcutaneous TID WC Irine Seal   3 Units at 04/24/11 1816  . lisinopril (PRINIVIL,ZESTRIL) tablet 20 mg  20 mg Oral BID Irine Seal   20 mg at 04/24/11 2202  . metoprolol tartrate (LOPRESSOR) tablet 75 mg  75 mg Oral BID Irine Seal   75 mg at 04/24/11 2202  . simvastatin (ZOCOR) tablet 20 mg  20 mg Oral q1800 Irine Seal   20 mg at 04/24/11 1815  . DISCONTD: 0.9 %  sodium chloride infusion   Intravenous Continuous Irine Seal 75 mL/hr at 04/24/11 0953    . DISCONTD: enoxaparin (LOVENOX) injection 30 mg  30 mg Subcutaneous Q24H Irine Seal   30 mg at 04/23/11 2343  . DISCONTD: gabapentin (NEURONTIN) tablet 600 mg  600 mg Oral Q6H Irine Seal       Medications Prior to Admission  Medication Sig Dispense Refill  . amitriptyline (ELAVIL) 50 MG tablet Take 50 mg by mouth at bedtime. For nerves/rest       . cycloSPORINE (RESTASIS) 0.05 % ophthalmic emulsion Place 1 drop into both eyes 2 (two) times daily.        . ferrous sulfate 325 (65 FE) MG tablet Take 325 mg by mouth 2 (two) times daily.        Marland Kitchen gabapentin (NEURONTIN) 600 MG tablet Take 600 mg by mouth. 4 times per day       . hydrochlorothiazide (,MICROZIDE/HYDRODIURIL,) 12.5 MG capsule Take 12.5 mg by mouth daily.       Marland Kitchen lisinopril (PRINIVIL,ZESTRIL) 20 MG tablet Take 20 mg by mouth 2 (two) times daily.       . metFORMIN (GLUCOPHAGE) 1000 MG tablet Take 1,000 mg by mouth 2 (two) times daily.       . metoprolol (LOPRESSOR) 50 MG tablet Take  50 mg by mouth 2 (two) times daily.       . pravastatin (PRAVACHOL) 40 MG tablet Take 40 mg by mouth at bedtime.          Home: Home Living Lives With: Other (Comment) (Sister lives with her) [place] Help From: Family Type of Home: House Home Layout: One level Home Access: Stairs to enter Entrance Stairs-Rails: Right Entrance Stairs-Number of Steps: 5 Bathroom Toilet: [place]: Bedside commode/3-in-1;Walker - rolling Additional Comments: Pt. states she was bathing up at sink (sit->stand) from 3n1 with  sisters help PTA  Functional History: Prior Function Level of Independence: Requires assistive device for independence;Needs assistance with gait;Needs assistance with tranfers;Needs assistance with ADLs Bath: Minimal Able to Take Stairs?: Yes Vocation: Unemployed Functional Status:  Mobility: Bed Mobility Bed Mobility: Yes Supine to Sit: 1: +2 Total assist;Patient percentage (comment);HOB elevated (Comment degrees);With rails Supine to Sit Details (indicate cue type and reason): Pt 30% with strong use of R UE, HOB 40deg Sit to Supine - Left: 1: +2 Total assist;Patient percentage (comment);HOB flat;With rail Sit to Supine - Left Details (indicate cue type and reason): Pt 30% with strong use of R UE Transfers Transfers: No (Pt likely to need lift for transfers at this time. ) Ambulation/Gait Ambulation/Gait: No Stairs: No Wheelchair Mobility Wheelchair Mobility: No  ADL: ADL Eating/Feeding: +1 Total assistance;Simulated Where Assessed - Eating/Feeding: Bed level  Cognition: Cognition Overall Cognitive Status: Impaired Arousal/Alertness: Awake/alert Orientation Level: Oriented to person;Oriented to place;Oriented to situation Attention: Sustained Sustained Attention: Impaired Sustained Attention Impairment: Verbal basic;Functional basic (Max assist) Memory: Impaired Memory Impairment: Storage deficit;Retrieval deficit;Decreased recall of new  information Awareness: Impaired Awareness Impairment: Intellectual impairment (Poor awareness of physical impairments) Problem Solving: Impaired Problem Solving Impairment: Functional basic Executive Function:  (TBA) Behaviors: Perseveration;Other (comment) (Apathy, impersistance, flat affect) Safety/Judgment: Impaired Cognition Arousal/Alertness: Awake/alert Overall Cognitive Status: Impaired Orientation Level: Oriented to person;Oriented to place;Oriented to situation Safety/Judgement: Decreased awareness of safety precautions;Decreased safety judgement for tasks assessed Decreased Safety/Judgement: Decreased awareness of need for assistance Safety/Judgement - Other Comments: Pt. stated she feels she can walk safely with RW  Blood pressure 133/97, pulse 84, temperature 98.5 F (36.9 C), temperature source Oral, resp. rate 24, height 5\' 7"  (1.702 m), weight 123.2 kg (271 lb 9.7 oz), SpO2 98.00%. Physical Exam  Constitutional: She appears well-developed.  HENT:  Head: Normocephalic.  Eyes: Pupils are equal, round, and reactive to light.  Neck: Neck supple. No thyromegaly present.  Cardiovascular: Normal rate.   Pulmonary/Chest: Effort normal.  Abdominal: She exhibits no distension. There is no tenderness.  Musculoskeletal: Normal range of motion. She exhibits no edema.  Neurological: She is alert.       Left facial droop  Psychiatric: Her behavior is normal.    Results for orders placed during the hospital encounter of 04/22/11 (from the past 24 hour(s))  GLUCOSE, CAPILLARY     Status: Abnormal   Collection Time   04/24/11  7:07 AM      Component Value Range   Glucose-Capillary 156 (*) 70 - 99 (mg/dL)  GLUCOSE, CAPILLARY     Status: Abnormal   Collection Time   04/24/11 11:26 AM      Component Value Range   Glucose-Capillary 150 (*) 70 - 99 (mg/dL)  ANTITHROMBIN III     Status: Normal   Collection Time   04/24/11 12:05 PM      Component Value Range   AntiThromb III  Func 98  75 - 120 (%)  HOMOCYSTEINE, SERUM     Status: Normal   Collection Time   04/24/11 12:05 PM      Component Value Range   Homocysteine-Norm 12.0  4.0 - 15.4 (umol/L)  C3 COMPLEMENT     Status: Normal   Collection Time   04/24/11 12:05 PM      Component Value Range   C3 Complement 148  90 - 180 (mg/dL)  C4 COMPLEMENT     Status: Abnormal   Collection Time   04/24/11 12:05 PM      Component Value  Range   Complement C4, Body Fluid 51 (*) 10 - 40 (mg/dL)  SEDIMENTATION RATE     Status: Abnormal   Collection Time   04/24/11 12:05 PM      Component Value Range   Sed Rate 130 (*) 0 - 22 (mm/hr)  RPR     Status: Normal   Collection Time   04/24/11 12:05 PM      Component Value Range   RPR NON REACTIVE  NON REACTIVE   GLUCOSE, CAPILLARY     Status: Abnormal   Collection Time   04/24/11  4:34 PM      Component Value Range   Glucose-Capillary 168 (*) 70 - 99 (mg/dL)   Comment 1 Notify RN    GLUCOSE, CAPILLARY     Status: Abnormal   Collection Time   04/24/11  9:50 PM      Component Value Range   Glucose-Capillary 158 (*) 70 - 99 (mg/dL)  CBC     Status: Abnormal   Collection Time   04/25/11  5:51 AM      Component Value Range   WBC 11.3 (*) 4.0 - 10.5 (K/uL)   RBC 3.23 (*) 3.87 - 5.11 (MIL/uL)   Hemoglobin 8.6 (*) 12.0 - 15.0 (g/dL)   HCT 27.4 (*) 36.0 - 46.0 (%)   MCV 84.8  78.0 - 100.0 (fL)   MCH 26.6  26.0 - 34.0 (pg)   MCHC 31.4  30.0 - 36.0 (g/dL)   RDW 15.9 (*) 11.5 - 15.5 (%)   Platelets 477 (*) 150 - 400 (K/uL)   Mr Angiogram Head Wo Contrast  04/23/2011  *RADIOLOGY REPORT*  Clinical Data:  Slurred speech and left-sided weakness.  MRI HEAD WITHOUT CONTRAST MRA HEAD WITHOUT CONTRAST  Technique:  Multiplanar, multiecho pulse sequences of the brain and surrounding structures were obtained without intravenous contrast. Angiographic images of the head were obtained using MRA technique without contrast.  Comparison:   04/22/2011 CT head.  MRI HEAD  Findings:  The  patient was moderately uncooperative and could not remain motionless for the study.  Images are suboptimal.  Small or subtle lesions could be overlooked.  There is a large area of acute infarction affecting the right insular cortex, right posterior frontal cortex, right superior temporal cortex, and underlying white matter.  There is no associated hemorrhage.  There is no hydrocephalus, mass lesion, or midline shift.  No extra-axial fluid collections are seen. There is moderate atrophy with chronic microvascular ischemic change.  There is a large remote left parietal infarct with gliosis and encephalomalacia.  The carotid and basilar arteries are patent based on flow void signal.  Pituitary and cerebellar tonsils are unremarkable.  There is no sinus or mastoid disease. 1 cm intraparotid lymph node suggested on the right.  Compared to prior CT, the acute infarction is not visible.  IMPRESSION: Large acute right hemisphere MCA territory infarct without hemorrhage or midline shift.  Atrophy and small vessel disease.  MRA HEAD  Findings: Mild nonstenotic irregularity both carotid siphons.  No supraclinoid ICA stenosis.  Basilar artery widely patent vertebrals codominant.  There is no proximal anterior, middle, or posterior cerebral artery stenosis. Mild irregularity of the distal MCA and PCA branches suggests intracranial atherosclerotic change, with a reduced number of M3 vessels on the right as compared to the left, correlating with the distribution of acute infarction.  IMPRESSION: No proximal carotid or basilar stenosis.  Reduced number of M3 vessels into the right middle cerebral artery territory.  Mild irregularity of the distal MCA and PCA branches which are visualized suggest intracranial atherosclerotic change.  Original Report Authenticated By: Staci Righter, M.D.   Mri Brain Without Contrast  04/23/2011  *RADIOLOGY REPORT*  Clinical Data:  Slurred speech and left-sided weakness.  MRI HEAD WITHOUT  CONTRAST MRA HEAD WITHOUT CONTRAST  Technique:  Multiplanar, multiecho pulse sequences of the brain and surrounding structures were obtained without intravenous contrast. Angiographic images of the head were obtained using MRA technique without contrast.  Comparison:   04/22/2011 CT head.  MRI HEAD  Findings:  The patient was moderately uncooperative and could not remain motionless for the study.  Images are suboptimal.  Small or subtle lesions could be overlooked.  There is a large area of acute infarction affecting the right insular cortex, right posterior frontal cortex, right superior temporal cortex, and underlying white matter.  There is no associated hemorrhage.  There is no hydrocephalus, mass lesion, or midline shift.  No extra-axial fluid collections are seen. There is moderate atrophy with chronic microvascular ischemic change.  There is a large remote left parietal infarct with gliosis and encephalomalacia.  The carotid and basilar arteries are patent based on flow void signal.  Pituitary and cerebellar tonsils are unremarkable.  There is no sinus or mastoid disease. 1 cm intraparotid lymph node suggested on the right.  Compared to prior CT, the acute infarction is not visible.  IMPRESSION: Large acute right hemisphere MCA territory infarct without hemorrhage or midline shift.  Atrophy and small vessel disease.  MRA HEAD  Findings: Mild nonstenotic irregularity both carotid siphons.  No supraclinoid ICA stenosis.  Basilar artery widely patent vertebrals codominant.  There is no proximal anterior, middle, or posterior cerebral artery stenosis. Mild irregularity of the distal MCA and PCA branches suggests intracranial atherosclerotic change, with a reduced number of M3 vessels on the right as compared to the left, correlating with the distribution of acute infarction.  IMPRESSION: No proximal carotid or basilar stenosis.  Reduced number of M3 vessels into the right middle cerebral artery territory.  Mild  irregularity of the distal MCA and PCA branches which are visualized suggest intracranial atherosclerotic change.  Original Report Authenticated By: Staci Righter, M.D.    Assessment/Plan: Diagnosis: Large Right MCA Infarct 1. Does the need for close, 24 hr/day medical supervision in concert with the patient's rehab needs make it unreasonable for this patient to be served in a less intensive setting? Yes 2. Co-Morbidities requiring supervision/potential complications: Diabetes type 2 depression prior right brain stroke hypertension morbid obesity. 3. Due to bladder management, bowel management, safety, skin/wound care, disease management, medication administration and patient education, does the patient require 24 hr/day rehab nursing? Yes 4. Does the patient require coordinated care of a physician, rehab nurse, PT (1-2 hrs/day, 5 days/week), OT (1-2 hrs/day, 5 days/week) and SLP (1-2 hrs/day, 5 days/week) to address physical and functional deficits in the context of the above medical diagnosis(es)? Yes Addressing deficits in the following areas: balance, bathing, bowel/bladder control, cognition, dressing, endurance, feeding, grooming, locomotion, psychosocial adjustment, speech, strength, swallowing, toileting and transferring 5. Can the patient actively participate in an intensive therapy program of at least 3 hrs of therapy per day at least 5 days per week? Yes and Potentially 6. The potential for patient to make measurable gains while on inpatient rehab is good 7. Anticipated functional outcomes upon discharge from inpatients are minimal to moderate assist PT, moderate assist OT, supervision to moderate assist SLP 8. Estimated rehab length  of stay to reach the above functional goals is: 3-4 weeks 9. Does the patient have adequate social supports to accommodate these discharge functional goals? Yes and Potentially 10. Anticipated D/C setting: Home 11. Anticipated post D/C treatments: [place]  therapy 12. Overall Rehab/Functional Prognosis: good  RECOMMENDATIONS: This patient's condition is appropriate for continued rehabilitative care in the following setting: CIR Patient has agreed to participate in recommended program. Yes and Potentially Note that insurance prior authorization may be required for reimbursement for recommended care.  Comment:  Need to followup on social supports. This patient will need significant assistance even after inpatient rehabilitation stay. Please see above estimated discharge goals. Rehabilitation nurse will followup.   ANGIULLI,DANIEL J. 04/25/2011

## 2011-04-25 NOTE — Procedures (Signed)
INDICATIONS:  CVA  PROCEDURE:   Informed consent was obtained prior to the procedure. The risks, benefits and alternatives for the procedure were discussed and the patient comprehended these risks.  Risks include, but are not limited to, cough, sore throat, vomiting, nausea, somnolence, esophageal and stomach trauma or perforation, bleeding, low blood pressure, aspiration, pneumonia, infection, trauma to the teeth and death.    After a procedural time-out, the patient was given 3 mg versed and 25 mcg fentanyl for moderate sedation.  The oropharynx was anesthetized 5 cc of topical 1% viscous lidocaine and cetacaine spray.  The transesophageal probe was inserted in the esophagus and stomach without difficulty and multiple views were obtained.  The patient was kept under observation until the patient left the procedure room.  The patient left the procedure room in stable condition.   Agitated microbubble saline contrast was administered.  COMPLICATIONS:    There were no immediate complications.  FINDINGS:  1. LEFT VENTRICLE: The left ventricle is dilated with severe global hypokinesis. EF 20-25%. Moderate LVH.  2. RIGHT VENTRICLE:  The right ventricle is mildly dilated. Moderate to severe global HK.   3. LEFT ATRIUM:  The left atrium is dilated with heavy smoke.  4. ATRIAL SEPTUM:  The atrial septum is free of any thrombus or masses.  There is no evidence for interatrial shunting. Bubble negative.  5. RIGHT ATRIUM:  The right atrium is is dilated with heavy smoke.  6. MITRAL VALVE:  The mitral valve is normal in structure. The annulus is dilated with moderate to severe MR.  There were no vegetations or stenosis.  7. AORTIC VALVE:  The aortic valve is normal in structure and function without regurgitation.  There were no vegetations or stenosis   8. TRICUSPID VALVE:  The tricuspid valve is normal in structure with moderate regurgitation.  There were no vegetations or stenosis.    9. AORTIC ARCH, ASCENDING AND DESCENDING AORTA:  There was moderate to severe atherosclerosis of the descending aorta 10. PERICARDIUM:  Small concentric pericardial effusion. 11. PULMONIC VALVE: Trivial PR      IMPRESSION:   1. Severe biventricular dysfunction with estimated LVEF 20-25% 2. Moderate to severe MR 3. Moderate TR 4. Biatrial enlargement with severe stasis (smoke) 5. No PFO 6. Small pericardial effusion   Glori Bickers 04/25/2011, 2:57 PM

## 2011-04-25 NOTE — H&P (View-Only) (Signed)
Stroke Team Progress Note  SUBJECTIVE No complaints.no changes noted overnight  OBJECTIVE Most recent Vital Signs: Temp: 98.5 F (36.9 C) (11/14 0539) Temp src: Oral (11/14 0539) BP: 133/97 mmHg (11/14 0539) Pulse Rate: 84  (11/14 0539) Respiratory Rate: 24  Intake/Output from previous day: 11/13 0701 - 11/14 0700 In: 73 [P.O.:50] Out: 300 [Urine:300]   Diet: NPO for TEE  Activity: Up with assistance  DVT Prophylaxis:  Lovenox 40 mg sq daily   Physical Exam:  Obese young African American lady not in distress. Awake, alert, oriented x3. Speech and language appear normal. She's able to name and repeat quite well. She has right gaze preference but is able to look to the left. There is dense left homonymous hemianopsia. There is minimal left lower facial symmetry. She has dense left upper extremity plegia with 0/5 strength. Left lower extremity strength is 2-3/5. There is mild left hemi-neglect and hemisensory loss. Right plantar is downgoing left is upgoing.  ASSESSMENT Patient Active Problem List  Diagnoses  . DIABETES MELLITUS, TYPE II  . HYPERLIPIDEMIA  . ANXIETY  . DEPRESSION  . NEUROPATHY, IDIOPATHIC PERIPHERAL NOS  . GERD  . DEGENERATIVE JOINT DISEASE  . Lumbago  . HEADACHE  . MRSA  . OBESITY  . ANEMIA  . HYPERTENSION  . CVA  . DVT  . RENAL FAILURE  . OSTEOMYELITIS  . SLEEP APNEA  . TACHYCARDIA  . CVA (cerebral vascular accident)  . Tachycardia   [name] is a 50 y.o. female with a history of embolic stroke Jan 0000000, source not found. Now with with fluctuating slurred speech and left-sided weakness which has worsened since admission. MRI shows a right frontal middle cerebral artery branch infarct. She has multiple uncontrolled wrist factors of hypertension, diabetes, hyperlipidemia and a basically.  Hospital day # 3  TREATMENT/PLAN  Agree with CIR consult. TEE today. Follow up hypercoag and vasculitis labs.discuss with sister and answered  questions.  Steffanie Rainwater, ANP-BC, GNP-BC [facility] Pager: [phone] 04/25/2011 8:14 AM

## 2011-04-26 ENCOUNTER — Encounter (HOSPITAL_COMMUNITY): Payer: Self-pay

## 2011-04-26 ENCOUNTER — Inpatient Hospital Stay (HOSPITAL_COMMUNITY)
Admission: RE | Admit: 2011-04-26 | Discharge: 2011-05-16 | DRG: 945 | Disposition: A | Discharge status: Skilled Nursing Facility | Payer: Medicaid Other | Source: Ambulatory Visit | Attending: Physical Medicine & Rehabilitation | Admitting: Physical Medicine & Rehabilitation

## 2011-04-26 DIAGNOSIS — D649 Anemia, unspecified: Secondary | ICD-10-CM | POA: Insufficient documentation

## 2011-04-26 DIAGNOSIS — I6340 Cerebral infarction due to embolism of unspecified cerebral artery: Secondary | ICD-10-CM

## 2011-04-26 DIAGNOSIS — H532 Diplopia: Secondary | ICD-10-CM

## 2011-04-26 DIAGNOSIS — E669 Obesity, unspecified: Secondary | ICD-10-CM

## 2011-04-26 DIAGNOSIS — M1990 Unspecified osteoarthritis, unspecified site: Secondary | ICD-10-CM

## 2011-04-26 DIAGNOSIS — R4789 Other speech disturbances: Secondary | ICD-10-CM

## 2011-04-26 DIAGNOSIS — K219 Gastro-esophageal reflux disease without esophagitis: Secondary | ICD-10-CM

## 2011-04-26 DIAGNOSIS — E1139 Type 2 diabetes mellitus with other diabetic ophthalmic complication: Secondary | ICD-10-CM

## 2011-04-26 DIAGNOSIS — I6330 Cerebral infarction due to thrombosis of unspecified cerebral artery: Secondary | ICD-10-CM

## 2011-04-26 DIAGNOSIS — F3289 Other specified depressive episodes: Secondary | ICD-10-CM | POA: Insufficient documentation

## 2011-04-26 DIAGNOSIS — Z8614 Personal history of Methicillin resistant Staphylococcus aureus infection: Secondary | ICD-10-CM

## 2011-04-26 DIAGNOSIS — G8190 Hemiplegia, unspecified affecting unspecified side: Secondary | ICD-10-CM

## 2011-04-26 DIAGNOSIS — Z6841 Body Mass Index (BMI) 40.0 and over, adult: Secondary | ICD-10-CM

## 2011-04-26 DIAGNOSIS — S98139A Complete traumatic amputation of one unspecified lesser toe, initial encounter: Secondary | ICD-10-CM

## 2011-04-26 DIAGNOSIS — I509 Heart failure, unspecified: Secondary | ICD-10-CM

## 2011-04-26 DIAGNOSIS — R29898 Other symptoms and signs involving the musculoskeletal system: Secondary | ICD-10-CM

## 2011-04-26 DIAGNOSIS — I639 Cerebral infarction, unspecified: Secondary | ICD-10-CM | POA: Diagnosis present

## 2011-04-26 DIAGNOSIS — G609 Hereditary and idiopathic neuropathy, unspecified: Secondary | ICD-10-CM | POA: Insufficient documentation

## 2011-04-26 DIAGNOSIS — I6350 Cerebral infarction due to unspecified occlusion or stenosis of unspecified cerebral artery: Secondary | ICD-10-CM

## 2011-04-26 DIAGNOSIS — Z5189 Encounter for other specified aftercare: Principal | ICD-10-CM

## 2011-04-26 DIAGNOSIS — E785 Hyperlipidemia, unspecified: Secondary | ICD-10-CM | POA: Insufficient documentation

## 2011-04-26 DIAGNOSIS — I429 Cardiomyopathy, unspecified: Secondary | ICD-10-CM

## 2011-04-26 DIAGNOSIS — G4730 Sleep apnea, unspecified: Secondary | ICD-10-CM

## 2011-04-26 DIAGNOSIS — H53469 Homonymous bilateral field defects, unspecified side: Secondary | ICD-10-CM

## 2011-04-26 DIAGNOSIS — R1310 Dysphagia, unspecified: Secondary | ICD-10-CM

## 2011-04-26 DIAGNOSIS — I059 Rheumatic mitral valve disease, unspecified: Secondary | ICD-10-CM

## 2011-04-26 DIAGNOSIS — R29810 Facial weakness: Secondary | ICD-10-CM

## 2011-04-26 DIAGNOSIS — I69920 Aphasia following unspecified cerebrovascular disease: Secondary | ICD-10-CM

## 2011-04-26 DIAGNOSIS — I69998 Other sequelae following unspecified cerebrovascular disease: Secondary | ICD-10-CM

## 2011-04-26 DIAGNOSIS — G8110 Spastic hemiplegia affecting unspecified side: Secondary | ICD-10-CM

## 2011-04-26 DIAGNOSIS — Z79899 Other long term (current) drug therapy: Secondary | ICD-10-CM

## 2011-04-26 DIAGNOSIS — I428 Other cardiomyopathies: Secondary | ICD-10-CM

## 2011-04-26 DIAGNOSIS — A4902 Methicillin resistant Staphylococcus aureus infection, unspecified site: Secondary | ICD-10-CM | POA: Diagnosis not present

## 2011-04-26 DIAGNOSIS — F341 Dysthymic disorder: Secondary | ICD-10-CM

## 2011-04-26 DIAGNOSIS — E118 Type 2 diabetes mellitus with unspecified complications: Secondary | ICD-10-CM | POA: Insufficient documentation

## 2011-04-26 DIAGNOSIS — I10 Essential (primary) hypertension: Secondary | ICD-10-CM | POA: Insufficient documentation

## 2011-04-26 DIAGNOSIS — F329 Major depressive disorder, single episode, unspecified: Secondary | ICD-10-CM | POA: Insufficient documentation

## 2011-04-26 DIAGNOSIS — E11319 Type 2 diabetes mellitus with unspecified diabetic retinopathy without macular edema: Secondary | ICD-10-CM

## 2011-04-26 LAB — CARDIOLIPIN ANTIBODIES, IGG, IGM, IGA
Anticardiolipin IgA: 7 APL U/mL — ABNORMAL LOW (ref <22)
Anticardiolipin IgG: 8 GPL U/mL — ABNORMAL LOW (ref <23)

## 2011-04-26 LAB — TYPE AND SCREEN
ABO/RH(D): A POS
Antibody Screen: NEGATIVE

## 2011-04-26 LAB — RETICULOCYTES
RBC.: 3.13 MIL/uL — ABNORMAL LOW (ref 3.87–5.11)
Retic Count, Absolute: 103.3 10*3/uL (ref 19.0–186.0)
Retic Ct Pct: 3.3 % — ABNORMAL HIGH (ref 0.4–3.1)

## 2011-04-26 LAB — IRON AND TIBC: Saturation Ratios: 19 % — ABNORMAL LOW (ref 20–55)

## 2011-04-26 LAB — CREATININE, SERUM
Creatinine, Ser: 1.68 mg/dL — ABNORMAL HIGH (ref 0.50–1.10)
GFR calc Af Amer: 41 mL/min — ABNORMAL LOW (ref >90)

## 2011-04-26 LAB — GLUCOSE, CAPILLARY: Glucose-Capillary: 125 mg/dL — ABNORMAL HIGH (ref 70–99)

## 2011-04-26 LAB — FERRITIN: Ferritin: 267 ng/mL (ref 10–291)

## 2011-04-26 MED ORDER — BIOTENE DRY MOUTH MT LIQD
15.0000 mL | Freq: Two times a day (BID) | OROMUCOSAL | Status: DC
  Administered 2011-04-26 – 2011-05-16 (×37): 15 mL via OROMUCOSAL

## 2011-04-26 MED ORDER — SIMVASTATIN 20 MG PO TABS
20.0000 mg | ORAL_TABLET | Freq: Every day | ORAL | Status: DC
  Administered 2011-04-26 – 2011-05-15 (×21): 20 mg via ORAL
  Filled 2011-04-26 (×21): qty 1

## 2011-04-26 MED ORDER — INSULIN ASPART 100 UNIT/ML ~~LOC~~ SOLN
0.0000 [IU] | Freq: Three times a day (TID) | SUBCUTANEOUS | Status: DC
  Administered 2011-04-26: 2 [IU] via SUBCUTANEOUS
  Administered 2011-04-27 (×2): 3 [IU] via SUBCUTANEOUS
  Administered 2011-04-28 – 2011-05-10 (×10): 2 [IU] via SUBCUTANEOUS
  Filled 2011-04-26 (×2): qty 3

## 2011-04-26 MED ORDER — ALUM & MAG HYDROXIDE-SIMETH 400-400-40 MG/5ML PO SUSP
30.0000 mL | ORAL | Status: DC | PRN
  Filled 2011-04-26: qty 30

## 2011-04-26 MED ORDER — WARFARIN SODIUM 10 MG PO TABS
10.0000 mg | ORAL_TABLET | Freq: Once | ORAL | Status: AC
  Administered 2011-04-26: 10 mg via ORAL
  Filled 2011-04-26: qty 1

## 2011-04-26 MED ORDER — METHOCARBAMOL 500 MG PO TABS
500.0000 mg | ORAL_TABLET | Freq: Four times a day (QID) | ORAL | Status: DC | PRN
  Administered 2011-04-30: 500 mg via ORAL
  Filled 2011-04-26: qty 1

## 2011-04-26 MED ORDER — FLUCONAZOLE 100 MG PO TABS
100.0000 mg | ORAL_TABLET | Freq: Every day | ORAL | Status: DC
  Administered 2011-04-26: 100 mg via ORAL
  Filled 2011-04-26: qty 1

## 2011-04-26 MED ORDER — FERROUS SULFATE 325 (65 FE) MG PO TABS
325.0000 mg | ORAL_TABLET | Freq: Two times a day (BID) | ORAL | Status: DC
  Administered 2011-04-26 – 2011-05-16 (×40): 325 mg via ORAL
  Filled 2011-04-26 (×45): qty 1

## 2011-04-26 MED ORDER — PROMETHAZINE HCL 25 MG/ML IJ SOLN: 12.5000 mg | Freq: Four times a day (QID) | INTRAMUSCULAR | Status: DC | PRN

## 2011-04-26 MED ORDER — BISACODYL 10 MG RE SUPP: 10.0000 mg | Freq: Every day | RECTAL | Status: DC | PRN

## 2011-04-26 MED ORDER — FUROSEMIDE 20 MG PO TABS
20.0000 mg | ORAL_TABLET | Freq: Every day | ORAL | Status: DC
  Administered 2011-04-27 – 2011-05-16 (×20): 20 mg via ORAL
  Filled 2011-04-26 (×21): qty 1

## 2011-04-26 MED ORDER — CITALOPRAM HYDROBROMIDE 10 MG PO TABS
10.0000 mg | ORAL_TABLET | Freq: Every day | ORAL | Status: DC
  Administered 2011-04-26 – 2011-05-15 (×20): 10 mg via ORAL
  Filled 2011-04-26 (×21): qty 1

## 2011-04-26 MED ORDER — CYCLOSPORINE 0.05 % OP EMUL
1.0000 [drp] | Freq: Two times a day (BID) | OPHTHALMIC | Status: DC
  Administered 2011-04-26 – 2011-04-27 (×2): 1 [drp] via OPHTHALMIC
  Administered 2011-04-27: 8 [drp] via OPHTHALMIC
  Administered 2011-04-28 (×2): via OPHTHALMIC
  Administered 2011-04-29 – 2011-05-01 (×5): 1 [drp] via OPHTHALMIC
  Administered 2011-05-01: 20:00:00 via OPHTHALMIC
  Administered 2011-05-02: 1 [drp] via OPHTHALMIC
  Administered 2011-05-02: 09:00:00 via OPHTHALMIC
  Administered 2011-05-03: 1 [drp] via OPHTHALMIC
  Administered 2011-05-03: 09:00:00 via OPHTHALMIC
  Administered 2011-05-04: 1 [drp] via OPHTHALMIC
  Administered 2011-05-04: 10:00:00 via OPHTHALMIC
  Administered 2011-05-05 – 2011-05-07 (×5): 1 [drp] via OPHTHALMIC
  Administered 2011-05-07: 21:00:00 via OPHTHALMIC
  Administered 2011-05-08: 1 [drp] via OPHTHALMIC
  Administered 2011-05-08: 20:00:00 via OPHTHALMIC
  Administered 2011-05-09 (×2): 1 [drp] via OPHTHALMIC
  Administered 2011-05-10: 20:00:00 via OPHTHALMIC
  Administered 2011-05-10: 1 [drp] via OPHTHALMIC
  Administered 2011-05-11: 8 [drp] via OPHTHALMIC
  Administered 2011-05-11 – 2011-05-12 (×3): 1 [drp] via OPHTHALMIC
  Administered 2011-05-13: 20:00:00 via OPHTHALMIC
  Administered 2011-05-13: 1 [drp] via OPHTHALMIC
  Administered 2011-05-14: 08:00:00 via OPHTHALMIC
  Administered 2011-05-14: 1 [drp] via OPHTHALMIC
  Administered 2011-05-15: 08:00:00 via OPHTHALMIC
  Administered 2011-05-15 – 2011-05-16 (×2): 1 [drp] via OPHTHALMIC
  Filled 2011-04-26 (×45): qty 1

## 2011-04-26 MED ORDER — METOPROLOL TARTRATE 50 MG PO TABS
75.0000 mg | ORAL_TABLET | Freq: Two times a day (BID) | ORAL | Status: DC
  Administered 2011-04-26 – 2011-05-16 (×40): 75 mg via ORAL
  Filled 2011-04-26 (×44): qty 1

## 2011-04-26 MED ORDER — LOSARTAN POTASSIUM 50 MG PO TABS
100.0000 mg | ORAL_TABLET | Freq: Every day | ORAL | Status: DC
  Administered 2011-04-27 – 2011-05-16 (×20): 100 mg via ORAL
  Filled 2011-04-26 (×22): qty 2

## 2011-04-26 MED ORDER — SORBITOL 70 % SOLN
30.0000 mL | Freq: Two times a day (BID) | Status: DC | PRN
  Filled 2011-04-26: qty 30

## 2011-04-26 MED ORDER — GLIMEPIRIDE 2 MG PO TABS
2.0000 mg | ORAL_TABLET | Freq: Every day | ORAL | Status: DC
  Administered 2011-04-27 – 2011-05-16 (×20): 2 mg via ORAL
  Filled 2011-04-26 (×21): qty 1

## 2011-04-26 MED ORDER — PROMETHAZINE HCL 12.5 MG RE SUPP: 12.5000 mg | Freq: Four times a day (QID) | RECTAL | Status: DC | PRN

## 2011-04-26 MED ORDER — PATIENT'S GUIDE TO USING COUMADIN BOOK
Freq: Once | Status: AC
  Administered 2011-04-26: 1
  Filled 2011-04-26: qty 1

## 2011-04-26 MED ORDER — GUAIFENESIN-DM 100-10 MG/5ML PO SYRP: 5.0000 mL | ORAL_SOLUTION | Freq: Four times a day (QID) | ORAL | Status: DC | PRN

## 2011-04-26 MED ORDER — PROMETHAZINE HCL 12.5 MG PO TABS: 12.5000 mg | ORAL_TABLET | Freq: Four times a day (QID) | ORAL | Status: DC | PRN

## 2011-04-26 MED ORDER — DIPHENHYDRAMINE HCL 12.5 MG/5ML PO ELIX: 12.5000 mg | ORAL_SOLUTION | Freq: Four times a day (QID) | ORAL | Status: DC | PRN

## 2011-04-26 MED ORDER — GABAPENTIN 300 MG PO CAPS
600.0000 mg | ORAL_CAPSULE | Freq: Three times a day (TID) | ORAL | Status: DC
  Administered 2011-04-26 – 2011-05-01 (×16): 600 mg via ORAL
  Filled 2011-04-26 (×20): qty 2

## 2011-04-26 MED ORDER — ACETAMINOPHEN 325 MG PO TABS
325.0000 mg | ORAL_TABLET | ORAL | Status: DC | PRN
  Administered 2011-04-27 – 2011-04-28 (×2): 650 mg via ORAL
  Filled 2011-04-26 (×2): qty 2

## 2011-04-26 MED ORDER — FLUCONAZOLE 100 MG PO TABS
100.0000 mg | ORAL_TABLET | Freq: Every day | ORAL | Status: AC
  Administered 2011-04-27 – 2011-04-29 (×3): 100 mg via ORAL
  Filled 2011-04-26 (×3): qty 1

## 2011-04-26 MED ORDER — FUROSEMIDE 20 MG PO TABS
20.0000 mg | ORAL_TABLET | Freq: Every day | ORAL | Status: DC
  Administered 2011-04-26: 20 mg via ORAL
  Filled 2011-04-26 (×2): qty 1

## 2011-04-26 MED ORDER — ENOXAPARIN SODIUM 40 MG/0.4ML ~~LOC~~ SOLN
40.0000 mg | SUBCUTANEOUS | Status: DC
  Administered 2011-04-26 – 2011-04-29 (×4): 40 mg via SUBCUTANEOUS
  Filled 2011-04-26 (×6): qty 0.4

## 2011-04-26 MED ORDER — WARFARIN VIDEO
Freq: Once | Status: AC
  Administered 2011-04-27: 12:00:00
  Filled 2011-04-26: qty 1

## 2011-04-26 MED ORDER — AMITRIPTYLINE HCL 50 MG PO TABS
50.0000 mg | ORAL_TABLET | Freq: Every day | ORAL | Status: DC
  Administered 2011-04-26 – 2011-05-15 (×20): 50 mg via ORAL
  Filled 2011-04-26 (×24): qty 1

## 2011-04-26 MED ORDER — ASPIRIN 325 MG PO TABS
325.0000 mg | ORAL_TABLET | Freq: Every day | ORAL | Status: DC
  Administered 2011-04-27 – 2011-04-30 (×4): 325 mg via ORAL
  Filled 2011-04-26 (×6): qty 1

## 2011-04-26 NOTE — Progress Notes (Signed)
Pt requested a medication for anxiety. Called Dr Judeth Horn and explained pts request to him. Request denied due to patient admission dx of Right CVA.  Pt has not been given medication for anxiety at home or on unit 3000. Pt on observation appears calm and is falling back off to sleep following dinner. Pt was lethargic on admission to unit 4000.

## 2011-04-26 NOTE — Progress Notes (Signed)
OT Cancellation Note  Treatment cancelled this a.m.  Pt politely declined due to fatigue after morning activity.  Will attempt again later as schedule allows.  04/26/2011 Nestor Lewandowsky, OTR/L Pager: [phone]

## 2011-04-26 NOTE — Progress Notes (Addendum)
Speech Pathology: Dysphagia/ Cognitive-linguistic Treatment Note  Patient was observed with : Pureed and Thin liquids.  Patient was noted to have s/s of aspiration : Yes:  Soft cough x1 over 10 trials.    Lung Sounds:  Clear  Temperature: afebrile   Pt independently followed precautions, taking small sips and bites while self feeding.  Pt with left sided oral weakness, no dentures in place. Pt verbalized that she did not feel that she needed a puree diet, but also admitted that she has significant pocketing in left oral cavity requiring liquid wash.  Pt may be appropriate for a diet upgrade with dentition in place.  Will defer management to inpatient rehab SLP.   Pt verbalized deficits x3 with question cue.  Completed 2 step command with mod verbal cues to repeat instructions x1.  SLP provided max verbal cues to utilize environmental indicators for orientation, however sister informed SLP that pts baseline visual deficits are severe (macular degeneration) and that she would be unlikely to see many elements of her surrounding environment.  Pt also participated in conversation for 3 minutes with min questioning cues to extend single word reponses to complex phrases and sentences.  Slow rate, but language functional with no significant word finding.   Recommendations:  Pt to continue current diet, receive therapy for cognition and swallowing in the inpatient rehab setting.   Pain:   none Intervention Required:   No   Goals: Goals partially met  Herbie [place], [place] CCC-SLP [phone]

## 2011-04-26 NOTE — H&P (Signed)
Physical Medicine and Rehabilitation Admission H&P  CC:  Slurred speech, facial droop, increased left sided weakness, left HH  HPI: [name] is an 50 y.o. female with H/O HTN, DM, 11/11CVA with  left sided weakness and an mild aphasia. Admitted 12/11 with slurred speech, and slight facial droop. With a  Patient with worsening of left sided weakness past discharge. CT head done and without acute changes. MRI brain with large acute right MCA infarct affecting insular cortex, posterior frontal cortex, superior temporal cortex and underlying white matter. MRA without stenosis.   2D echo with EF 30-35% with moderate to severe hypokinesis.  Carotid dopplers without ICA stenosis.  Evaluated by neuro who recommended TEE as patient with significant decrease in EF.  Hypercoagulable and vasculitis panel ordered.  Patient with outpatient cardiac telemetry which was negative for PAF.  TEE done revealing moderate LVH, severe global hypokinesis with EF 20-25%, bi atrial enlargement with severe statis, moderate to severe MR, no PFO and severe atherosclerosis of aortic arch.  Cardiology recommended low dose diuretic due to decreased EF and no ace due to history of SOB with lisinopril.  Neurology recommends coumadin given TEE findings and as patient with  2 embolic strokes within a year.  Patient continues with dense LUE plegia with hemisensory deficits , dense left homonymous hemianopsia, mild left facial weakness, and dysphagia and right gaze preference.  On puree diet with thin liquids due to severe pocketing on left and impulsivity at meals.  Family with reports of bloody, purulent drainage from left great toe.  WOC recommends podiatry consult for nail debridement in the future and local care with betadine swabs. Podiatry consulted today. Therapies initiated and working on pre gait activities.  Patient  Requires max cues for sequencing, with decreased awareness of deficits.       Review of Systems  HENT: Positive  for ear pain.   Eyes: Positive for blurred vision and double vision.  Respiratory: Negative.   Cardiovascular: Negative.   Gastrointestinal: Negative.   Genitourinary: Positive for frequency.  Musculoskeletal: Positive for joint pain.  Neurological: Positive for sensory change, speech change, focal weakness, weakness and headaches.  Psychiatric/Behavioral: Positive for depression.   Past Medical History  Diagnosis Date  . HTN (hypertension)   . HLD (hyperlipidemia)   . History of methicillin resistant staphylococcus aureus (MRSA)   . Sleep apnea   . CVA (cerebral infarction)   . Anemia   . Tachycardia   . Obesity   . Renal failure     due to vanc toxicity  . Osteomyelitis   . Lower back pain     Chronic  . Idiopathic peripheral neuropathy     NOS  . DJD (degenerative joint disease)   . Headache   . GERD (gastroesophageal reflux disease)   . DM type 2 (diabetes mellitus, type 2)   . Depression   . Anxiety   . Hypokalemia     resolved   Cerebral infarct  Embolic  Jan 0000000    left parietal and bilateral  . Esophagitis     distal  . Fluid overload     compensated  . Urinary retention   . H/O: pneumonia   . History of bacteremia   . Surgery, other elective     of the ear  . Stroke   .  Hx of left frontoparietal stroke in 12/11. History of right brain stroke. Diabetic retinopathy with decreased vision    Past Surgical History  Procedure Date  .  Tympanoplasty   . Toe amputation     left 2nd toe  . Dilation and curettage of uterus     history  .Marland Kitchen Laser surgery for retinopathy.  Family History  Problem Relation Age of Onset  . Coronary artery disease    . Diabetes     Social History: Live with sister independent for ADLs and mobility. She reports that she has never smoked. She has never used smokeless tobacco. She reports that she drinks alcohol. She reports that she does not use illicit drugs.  Allergies  Allergen Reactions  . Vancomycin Other (See  Comments)    Unknown    Lisinopril                                                                  SOB  Medications Prior to Admission  Medication Sig Dispense Refill  . amitriptyline (ELAVIL) 50 MG tablet Take 50 mg by mouth at bedtime. For nerves/rest       . cycloSPORINE (RESTASIS) 0.05 % ophthalmic emulsion Place 1 drop into both eyes 2 (two) times daily.        . ferrous sulfate 325 (65 FE) MG tablet Take 325 mg by mouth 2 (two) times daily.        Marland Kitchen gabapentin (NEURONTIN) 600 MG tablet Take 600 mg by mouth. 4 times per day       . hydrochlorothiazide (,MICROZIDE/HYDRODIURIL,) 12.5 MG capsule Take 12.5 mg by mouth daily.       Marland Kitchen lisinopril (PRINIVIL,ZESTRIL) 20 MG tablet Take 20 mg by mouth 2 (two) times daily.       . metFORMIN (GLUCOPHAGE) 1000 MG tablet Take 1,000 mg by mouth 2 (two) times daily.       . metoprolol (LOPRESSOR) 50 MG tablet Take 50 mg by mouth 2 (two) times daily.       . pravastatin (PRAVACHOL) 40 MG tablet Take 40 mg by mouth at bedtime.          Home:     Functional History: Prior Function Level of Independence: Requires assistive device for independence; Denies needs for assistance with gait or transfers.  Needed assist up from low surfaces. Bath: Minimal Able to Take Stairs?: Yes Vocation: Unemployed  Functional Status: ADL  Eating/Feeding: +1 Total assistance;Simulated  Where Assessed - Eating/Feeding: Bed level  Bed Mobility  Bed Mobility: Yes  Rolling Right: 1: +2 Total assist;Patient percentage (comment) (+2 total assist (pt=20%))  Rolling Right Details (indicate cue type and reason): Assist to flex left LE and bring left UE across chest. Facilitation to rotate trunk. Max cues for sequence.  Rolling Left: 1: +2 Total assist;Patient percentage (comment) (+2 total assist (pt=35%))  Rolling Left Details (indicate cue type and reason): Assist to flex left LE and to position left UE in a protected position. Facilitation to trunk to rotate. Max cues for  sequence.  Supine to Sit: 1: +2 Total assist;Patient percentage (comment);HOB elevated (Comment degrees) (+2 total assist (pt=30%); HOB 45 degrees.)  Supine to Sit Details (indicate cue type and reason): Assist to trunk to facilitate anterior translation and to bilateral LEs to move to EOB. Max cues for sequence.  Sit to Supine - Left: 1: +2 Total assist;Patient percentage (comment);HOB flat (+2 total assist (pt=20%))  Sit to Supine - Left Details (indicate cue type and reason): Assist to slow descent of trunk to bed and for bilateral LEs to translate onto bed. Max cues for sequence.  Transfers  Transfers: No  Ambulation/Gait  Ambulation/Gait: No  Stairs: No  Wheelchair Mobility  Wheelchair Mobility: No          Blood pressure 117/83, pulse 80, temperature 98.3 F (36.8 C), temperature source Oral, resp. rate 22, height 5\' 7"  (1.702 m), weight 125.3 kg (276 lb 3.8 oz), SpO2 98.00%. Physical Exam  Constitutional: She is oriented to person, place, and time.       Obese female with left facial droop and flat affect.  HENT:  Mouth/Throat: Oropharyngeal exudate (White coating on tongue, with food residue and poor oral care noted.  Poor dentition.) present.  Eyes:       Unable to cross over to left field  Neck: Normal range of motion.  Cardiovascular: Normal rate.   Pulmonary/Chest: Effort normal and breath sounds normal.  Abdominal: Soft. Bowel sounds are normal.  Neurological: She is alert and oriented to person, place, and time. A cranial nerve deficit (Left facial weakness with dysarthric speech with increased rate.  Left HH.  Blurred and double vision  reported.  ) is present.       Decreased sensation left greater than right.   Dense LUE paresis.  LLE weakness. Right gaze preference.  Decreased awareness.  Oriented to self, place, situation.  Able to correct month with minimal cues. Unable to state year with max cues.    Skin: Skin is warm and dry.  Psychiatric:       Apathy  noted.    Post Admission Physician Evaluation: 1. Functional deficits secondary  to R MCA distribution infarct. 2. Patient admitted to receive collaborative, interdisciplinary care between the physiatrist, rehab nursing staff, and therapy team. 3. Patient's level of medical complexity and substantial therapy needs in context of that medical necessity cannot be provided at a lesser intensity of care. Patient has experienced substantial functional loss from his/her baseline. Upon functional assessment at the time of the preadmission screening, patient was Bed Mobility: Yes  Supine to Sit: 1: +2 Total assist;Patient percentage (comment);HOB elevated (Comment degrees);With rails  Supine to Sit Details (indicate cue type and reason): Pt 30% with strong use of R UE, HOB 40deg  Sit to Supine - Left: 1: +2 Total assist;Patient percentage (comment);HOB flat;With rail  Sit to Supine - Left Details (indicate cue type and reason): Pt 30% with strong use of R UE  Transfers  Transfers: No (Pt likely to need lift for transfers at this time. )  Ambulation/Gait  Ambulation/Gait: No  Stairs: No  Wheelchair Mobility  Wheelchair Mobility: No  ADL:  ADL  Eating/Feeding: +1 Total assistance;Simulated  Where Assessed - Eating/Feeding: Bed level    4. .  Upon most recent functional evaluation, patient was see above.  Judging by the patient's diagnosis, physical exam, and functional history, the patient has potential for functional progress which will result in measurable gains while on inpatient rehab.  These gains will be of substantial and practical use upon discharge in facilitating mobility and self-care at the household level. 5. Physiatrist will provide 24 hour management of medical needs as well as oversight of the therapy plan/treatment and provide guidance as appropriate regarding the interaction of the two. 6. 24 hour rehab nursing will assist in the management of  bladder management, bowel management,  safety, skin/wound care and pain management  and help integrate therapy concepts, techniques,education, etc. 7. PT will assess and treat for: balance, endurance, locomotion and strength. Goals are: minimal assist. 8. OT will assess and treat for: bathing, cognition, dressing and grooming .  Goals are: minimal assist.  9. SLP will assess and treat for: cognition, speech and swallowing.  Goals are: supervision. 10. Case Management and Social Worker will assess and treat for psychological issues and discharge planning. 11. Team conference will be held weekly to assess progress toward goals and to determine barriers to discharge. 12.  Patient will receive at least 3 hours of therapy per day at least 5 days per week. 13. ELOS and Prognosis: 3-4 weeks good   Medical Problem List and Plan:  1. DVT Prophylaxis/Anticoagulation: Lovenox and aspirin till coumadin therapeutic. 2. Pain Management: Gabapentin for neuropathy.   3. Mood: flat affect.  May need medication for activation.  Patient Active Hospital Problem List: 4.CVA (cerebral vascular accident) (04/22/2011)    POA: Yes   Assessment: left hemiparesis, Left HH with diplopia, left inattention, left facial droop, decreased insight with poor awarness   Plan: Aspirin till coumadin therapeutic. 5.Diabetes:  Assessment:  Hgb AIC @ 8.4 (improve compared with 11/11 @ 13.9)  Plan:  Continue diabetic education.  Check CBGs @ ac and Hs basis.  Use SSI for tighter BS control.  Off metformin due to renal insufficiency   Substitute amaryl. 3.HTN:   Assessment: Boderline  control.  Plan:  Bid checks.  Monitor on metoprolol and hyzaar.  Monitor renal status. 4.Hyperlipidemia: on zocor. 5.Sleep apnea?  Will need sleep sturdy past discharge. 6.Depression/ Anxiety: Ego support.  Will have SW follow up.  Add celexa. 7.Neuropathy:  Continue gabapentin and elavil.   8. Left great toe drainage:  Add vinegar soaks and paint with betadine bid. Podiatry consult  pending. 9. Anemia:  ? Etiology. Need to monitor with coumadin being initiated. Continue iron supplement. Check anemia panel and stool guaiacs. 10. Cardiomyopathy with Mod-sev Mitral regurg     Charlett Blake 04/26/2011, 4:12 PM

## 2011-04-26 NOTE — Progress Notes (Signed)
Admitted to room 4030 from unit 3000. Sister at bedside assisting with admission. Pt lethargic, slurred speech when she did answer a question. Admission complete. 2+ Max Assist.

## 2011-04-26 NOTE — Progress Notes (Signed)
ANTICOAGULATION CONSULT NOTE - Initial Consult  Pharmacy Consult for  Couamdin Indication: stroke  Allergies  Allergen Reactions  . Ace Inhibitors Shortness Of Breath  . Vancomycin Other (See Comments)    Unknown     Patient Measurements: Height: 5\' 7"  (170.2 cm) Weight: 271 lb 9.7 oz (123.2 kg) IBW/kg (Calculated) : 61.6   Labs:  Basename 04/26/11 0623 04/25/11 0551 04/24/11 0625 04/23/11 1219  HGB -- 8.6* 8.6* --  HCT -- 27.4* 27.7* --  PLT -- 477* 409* --  APTT -- -- -- --  LABPROT -- -- -- --  INR -- -- -- --  HEPARINUNFRC -- -- -- --  CREATININE 1.68* 2.04* 1.74* --  CKTOTAL -- -- -- 63  CKMB -- -- -- 3.1  TROPONINI -- -- -- <0.30   Estimated Creatinine Clearance: 55.7 ml/min (by C-G formula based on Cr of 1.68).  Medical History: Past Medical History  Diagnosis Date  . HTN (hypertension)   . HLD (hyperlipidemia)   . History of methicillin resistant staphylococcus aureus (MRSA)   . Sleep apnea   . CVA (cerebral infarction)   . Anemia   . Tachycardia   . Obesity   . Renal failure     due to vanc toxicity  . Osteomyelitis   . Lower back pain     Chronic  . Idiopathic peripheral neuropathy     NOS  . DJD (degenerative joint disease)   . Headache   . GERD (gastroesophageal reflux disease)   . DM type 2 (diabetes mellitus, type 2)   . Depression   . Anxiety   . Hypokalemia     resolved  . Cerebral infarct     left parietal and bilateral  . Esophagitis     distal  . Fluid overload     compensated  . Urinary retention   . H/O: pneumonia   . History of bacteremia   . Surgery, other elective     of the ear  . Stroke     Hx of left frontoparietal stroke in 12/11. History of right brain stroke.    Medications:     Currently on Lovenox 40 mg SQ q24hrs, Aspririn 325 mg daily, Metoprolol 75 mg BID, Zocor 20 mg daily (for home Pravastatin 40 mg daily), Losartan 100 mg daily, Lasix 20 mg daily, Elavil 50 mg QHS, Neurontin 600 mg QID, Sliding scale  insulin, Restasis 1 drop OU BID,Ferrous Sulfate 325 mg BID,  Day #1 Fluconazole 100 mg PO daily  Lisinopril and HCTZ dc'd yesterday. Noted ACEi caused SOB in the past.  Has also been on Metformin at home  Assessment:    Prior CVA December 2011.  TEE yesterday.  EF 20-25%    To begin Coumadin today.   Expecting transfer to Rehab today.  Lovenox 30 mg given 11/11 & 11/12, 40 mg given 11/13 & 11/14.     Scr had been up to 2.04 yesterday, now down to 1.68. K+ 5.7 11/14, ACEi changed to ARB, HCTZ changed to Lasix.  Goal of Therapy:  INR 2-3   Plan:     Will plan to begin Coumadin with 10 mg today.      Will follow-up rehab admission orders.  Expect Lovenox will continue until INR >2.  Will f/u Aspirin plans.   Daily PT/INR to begin in AM.    WIll f/u bmet with you. Needs recheck K+ and f/u Scr on ARB and diuretic.  May need to consider decreasing Neurontin interval  if Scr remains elevated.  Arty Baumgartner 04/26/2011,11:51 AM

## 2011-04-26 NOTE — Progress Notes (Signed)
@   Subjective:  Denies SSCP, palpitations or Dyspnea Alternating eye patch for double vision  Objective:  Vital Signs in the last 24 hours:       Wt Readings from Last 1 Encounters:  04/23/11 123.2 kg (271 lb 9.7 oz)    Temp:  [97.4 F (36.3 C)-98.6 F (37 C)] 97.7 F (36.5 C) (11/15 1044) Pulse Rate:  [82-96] 88  (11/15 1044) Resp:  [12-22] 20  (11/15 1044) BP: (125-152)/(69-101) 132/85 mmHg (11/15 1044) SpO2:  [91 %-100 %] 100 % (11/15 1044)  Intake/Output from previous day: 11/14 0701 - 11/15 0700 In: 370 [P.O.:120; I.V.:250] Out: -  Intake/Output from this shift:    Physical Exam: General appearance: alert, no distress and morbidly obese Lungs: clear to auscultation bilaterally Heart: regular rate and rhythm, S1, S2 normal, no murmur, click, rub or gallop Abdomen: soft, non-tender; bowel sounds normal; no masses,  no organomegaly Neurologic: Motor: left hemiparesis with aphasia and visual disturbance  Lab Results:  Basename 04/25/11 0551 04/24/11 0625  WBC 11.3* 7.9  HGB 8.6* 8.6*  PLT 477* 409*    Basename 04/26/11 0623 04/25/11 0551 04/24/11 0625  NA -- 134* 136  K -- 5.7* 5.5*  CL -- 104 106  CO2 -- 21 20  GLUCOSE -- 148* 178*  BUN -- 37* 31*  CREATININE 1.68* 2.04* --    Basename 04/23/11 1219  TROPONINI <0.30   Hepatic Function Panel No results found for this basename: PROT,ALBUMIN,AST,ALT,ALKPHOS,BILITOT,BILIDIR,IBILI in the last 72 hours No results found for this basename: CHOL in the last 72 hours No results found for this basename: PROTIME in the last 72 hours  Imaging: No results found.  Cardiac Studies:  ECG:  Orders placed during the hospital encounter of 04/22/11  . EKG     Telemetry:  Echo:   Medications:     . amitriptyline  50 mg Oral QHS  . aspirin  325 mg Oral Daily  . cycloSPORINE  1 drop Both Eyes BID  . enoxaparin (LOVENOX) injection  40 mg Subcutaneous Q24H  . ferrous sulfate  325 mg Oral BID  . furosemide  20 mg  Oral Daily  . gabapentin  600 mg Oral Q6H  . insulin aspart  0-15 Units Subcutaneous TID WC  . losartan  100 mg Oral Daily  . metoprolol  75 mg Oral BID  . simvastatin  20 mg Oral q1800  . DISCONTD: hydrochlorothiazide  12.5 mg Oral Daily  . DISCONTD: lisinopril  20 mg Oral BID    Assessment/Plan:  CVA:  Agree with coumadin and ok to go to rehab DCM:  On ARB, diuretic and beta blocker.  F/U echo in 6 months    Jenkins Rouge 04/26/2011, 11:14 AM

## 2011-04-26 NOTE — Progress Notes (Signed)
Stroke Team Progress Note  SUBJECTIVE Sister at bedside.She has no new complaints. She had TEE yesterday by Dr. Haroldine Laws  which showed left atrial smoke and severe aortic arch plaques. There was no definite clot noted.  Her ejection fraction was quite low at 20-25% which was significantly decreased from a previous TEE from 2 years ago.Given her history of 2 embolic strokes within a year we decided to start her on Coumadin given the TEE findings.  OBJECTIVE Most recent Vital Signs: Temp: 98.6 F (37 C) (11/15 0609) Temp src: Oral (11/15 0609) BP: 148/86 mmHg (11/15 0609) Pulse Rate: 96  (11/15 0609) Respiratory Rate: 22  Intake/Output from previous day: 11/14 0701 - 11/15 0700 In: 370 [P.O.:120; I.V.:250] Out: -    Diet: Dysphagia 1 thin liquids  Activity: Up with assistance  DVT Prophylaxis:  Lovenox 40 mg sq daily   Studies: CBC    Component Value Date/Time   WBC 11.3* 04/25/2011 0551   RBC 3.23* 04/25/2011 0551   HGB 8.6* 04/25/2011 0551   HCT 27.4* 04/25/2011 0551   PLT 477* 04/25/2011 0551   MCV 84.8 04/25/2011 0551   MCH 26.6 04/25/2011 0551   MCHC 31.4 04/25/2011 0551   RDW 15.9* 04/25/2011 0551   LYMPHSABS 1.8 04/22/2011 1545   MONOABS 0.6 04/22/2011 1545   EOSABS 0.2 04/22/2011 1545   BASOSABS 0.0 04/22/2011 1545   CMP    Component Value Date/Time   NA 134* 04/25/2011 0551   K 5.7* 04/25/2011 0551   CL 104 04/25/2011 0551   CO2 21 04/25/2011 0551   GLUCOSE 148* 04/25/2011 0551   BUN 37* 04/25/2011 0551   CREATININE 1.68* 04/26/2011 0623   CALCIUM 8.8 04/25/2011 0551   PROT 6.5 04/22/2011 1545   ALBUMIN 2.2* 04/22/2011 1545   AST 10 04/22/2011 1545   ALT 7 04/22/2011 1545   ALKPHOS 84 04/22/2011 1545   BILITOT 0.2* 04/22/2011 1545   GFRNONAA 35* 04/26/2011 0623   GFRAA 41* 04/26/2011 0623   COAGS Lab Results  Component Value Date   INR 1.14 04/22/2011   INR 1.13 06/12/2010   INR 1.2 06/30/2008      Lipid Panel    Component Value Date/Time   CHOL 230* 04/23/2011 0359   TRIG 116 04/23/2011 0359   HDL 49 04/23/2011 0359   CHOLHDL 4.7 04/23/2011 0359   VLDL 23 04/23/2011 0359   LDLCALC 158* 04/23/2011 0359   HgbA1C  Lab Results  Component Value Date   HGBA1C 8.4* 04/22/2011   Urine Drug Screen     Component Value Date/Time   LABOPIA NONE DETECTED 04/23/2011 0714   COCAINSCRNUR NONE DETECTED 04/23/2011 0714   LABBENZ NONE DETECTED 04/23/2011 0714   AMPHETMU NONE DETECTED 04/23/2011 0714   THCU NONE DETECTED 04/23/2011 0714   LABBARB NONE DETECTED 04/23/2011 0714     TEE: 1. Severe biventricular dysfunction with estimated LVEF 20-25% 2. Moderate to severe MR 3. Moderate TR 4. Biatrial enlargement with severe stasis (smoke) 5. No PFO 6. Small pericardial effusion   Physical Exam:  Obese young African American lady not in distress. Awake, alert, oriented x3. Speech and language appear normal. She's able to name and repeat quite well. She has right gaze preference but is able to look to the left. There is dense left homonymous hemianopsia. There is minimal left lower facial symmetry. She has dense left upper extremity plegia with 0/5 strength. Left lower extremity strength is 2/5. There is mild  hemisensory loss. Right plantar is downgoing  left is upgoing.  ASSESSMENT [name] is a 50 y.o. female with a history of embolic stroke Jan 0000000, source not found. Now with with fluctuating slurred speech and left-sided weakness which has worsened since admission. MRI shows a right frontal middle cerebral artery branch infarct. She has multiple uncontrolled wrist factors of hypertension, diabetes, hyperlipidemia. TE yest shows EF 20% with haziness. Decreased EF since previous stroke of unknown reason and possibility of poor flow in heart leads to recommendation for coumadin for secondary stroke prevention.  Hospital day # 4  TREATMENT/PLAN Agree with plan for CIR today. Coumadin for secondary stroke prevention.I had a long  discussion with the patient and her sister with regards to the risk benefit of Coumadin and answered questions. Follow up Dr. Leonie Man 2 months.F/u with Dr Haroldine Laws at hear failure clinic  [name], AVNP, ANP-BC, GNP-BC [name] Pager: [phone] 04/26/2011 10:34 AM I have personally spoken to and examined this patient, discuss plan of care with patient and family and reviewed pertinent data. Antony Contras, MD

## 2011-04-26 NOTE — Progress Notes (Signed)
Patient to be admitted to inpatient acute rehabilitation today. Patient and sister in agreement. Please call me with any questions. [phone]. Discussed with Dr. Marye Round.

## 2011-04-26 NOTE — Progress Notes (Signed)
ANTICOAGULATION CONSULT NOTE - Initial Consult  Pharmacy Consult for  [place] Indication: stroke  Allergies  Allergen Reactions  . Ace Inhibitors Shortness Of Breath  . Vancomycin Other (See Comments)    Unknown     Patient Measurements: Height: 5\' 7"  (170.2 cm) Weight: 276 lb 3.8 oz (125.3 kg) IBW/kg (Calculated) : 61.6   Labs:  Basename 04/26/11 0623 04/25/11 0551 04/24/11 0625  HGB -- 8.6* 8.6*  HCT -- 27.4* 27.7*  PLT -- 477* 409*  APTT -- -- --  LABPROT -- -- --  INR -- -- --  HEPARINUNFRC -- -- --  CREATININE 1.68* 2.04* 1.74*  CKTOTAL -- -- --  CKMB -- -- --  TROPONINI -- -- --   Estimated Creatinine Clearance: 56.3 ml/min (by C-G formula based on Cr of 1.68).  Medical History: Past Medical History  Diagnosis Date  . HTN (hypertension)   . HLD (hyperlipidemia)   . History of methicillin resistant staphylococcus aureus (MRSA)   . Sleep apnea   . CVA (cerebral infarction)   . Anemia   . Tachycardia   . Obesity   . Renal failure     due to vanc toxicity  . Osteomyelitis   . Lower back pain     Chronic  . Idiopathic peripheral neuropathy     NOS  . DJD (degenerative joint disease)   . Headache   . GERD (gastroesophageal reflux disease)   . DM type 2 (diabetes mellitus, type 2)   . Depression   . Anxiety   . Hypokalemia     resolved  . Cerebral infarct     left parietal and bilateral  . Esophagitis     distal  . Fluid overload     compensated  . Urinary retention   . H/O: pneumonia   . History of bacteremia   . Surgery, other elective     of the ear  . Stroke     Hx of left frontoparietal stroke in 12/11. History of right brain stroke.    Medications:     Currently on Lovenox 40 mg SQ q24hrs, Aspririn 325 mg daily, Metoprolol 75 mg BID, Zocor 20 mg daily (for home Pravastatin 40 mg daily), Losartan 100 mg daily, Lasix 20 mg daily, Elavil 50 mg QHS, Neurontin 600 mg QID, Sliding scale insulin, Restasis 1 drop OU BID,Ferrous Sulfate 325 mg  BID,  Day #1 Fluconazole 100 mg PO daily  Lisinopril and HCTZ dc'd yesterday. Noted ACEi caused SOB in the past.  Has also been on Metformin at home  Assessment:    Prior CVA December 2011.  TEE yesterday.  EF 20-25%    To begin Coumadin today.   Expecting transfer to Rehab today.  Lovenox 30 mg given 11/11 & 11/12, 40 mg given 11/13 & 11/14.     Scr had been up to 2.04 yesterday, now down to 1.68. K+ 5.7 11/14, ACEi changed to ARB, HCTZ changed to Lasix.  Goal of Therapy:  INR 2-3   Plan:     Will plan to begin Coumadin with 10 mg today.      Lovenox  40mg  dailywill continue until INR >2.  Aspirin 325 mg daily   Daily PT/INR to begin in AM.    WIll f/u bmet with you. Needs recheck K+ and f/u Scr on ARB and diuretic.  May need to consider decreasing Neurontin interval if Scr remains elevated.  Leodis Sias T 04/26/2011,3:46 PM

## 2011-04-26 NOTE — Discharge Summary (Signed)
Physician Discharge Summary  Patient ID: [name] MRN: [id number] DOB/AGE: 18-Feb-1961 50 y.o. Primary Care Physician:FRY,STEPHEN A, MD Admit date: 04/22/2011 Discharge date: 04/26/2011    Discharge Diagnoses:  Large acute right hemispheric MCA territory infarct with left hemiplegia - possible  Embolic etiology Severe cardiomyopathy with biventricular dysfunction and estimated ejection fraction 20 - 25 % Ingrown toenail first toe left foot Chronic systolic congestive heart failure Principal Problem:  *CVA (cerebral vascular accident) Active Problems:  DIABETES MELLITUS, TYPE II  HYPERLIPIDEMIA  HYPERTENSION  CVA  Cardiomyopathy   Current Discharge Medication List    CONTINUE these medications which have NOT CHANGED   Details  amitriptyline (ELAVIL) 50 MG tablet Take 50 mg by mouth at bedtime. For nerves/rest     cycloSPORINE (RESTASIS) 0.05 % ophthalmic emulsion Place 1 drop into both eyes 2 (two) times daily.      ferrous sulfate 325 (65 FE) MG tablet Take 325 mg by mouth 2 (two) times daily.      gabapentin (NEURONTIN) 600 MG tablet Take 600 mg by mouth. 4 times per day     metFORMIN (GLUCOPHAGE) 1000 MG tablet Take 1,000 mg by mouth 2 (two) times daily.     metoprolol (LOPRESSOR) 50 MG tablet Take 50 mg by mouth 2 (two) times daily.     pravastatin (PRAVACHOL) 40 MG tablet Take 40 mg by mouth at bedtime.        STOP taking these medications     hydrochlorothiazide (,MICROZIDE/HYDRODIURIL,) 12.5 MG capsule      lisinopril (PRINIVIL,ZESTRIL) 20 MG tablet         Discharged Condition:to rehab    Consults: Dr. Jenkins Rouge from cardiology Doctor Leonie Man from neurology  Significant Diagnostic Studies: Dg Chest 2 View  04/22/2011  *RADIOLOGY REPORT*  Clinical Data: Tachycardia  CHEST - 2 VIEW  Comparison: 06/19/2010  Findings: Cardiomegaly with pulmonary vascular congestion and suspected mild interstitial edema. No pleural effusion or pneumothorax.   Degenerative changes of the visualized thoracolumbar spine.  IMPRESSION: Cardiomegaly with pulmonary vascular congestion and suspected mild interstitial edema.  Original Report Authenticated By: Julian Hy, M.D.   Ct Head Wo Contrast  04/22/2011  *RADIOLOGY REPORT*  Clinical Data: Slurred speech, left-sided weakness  CT HEAD WITHOUT CONTRAST  Technique:  Contiguous axial images were obtained from the base of the skull through the vertex without contrast.  Comparison: Head MRI 06/13/2010  Findings: There is a cortical infarction in the high left frontal and parietal lobe which corresponds to prior infarction demonstrated on MRI 06/13/2010.  There is no CT evidence of acute infarction.  No intracranial hemorrhage.  No focal mass lesion.  No midline shift or mass effect.  No hydrocephalus.  Paranasal sinuses and mastoid air cells are clear.  Orbits are normal.  IMPRESSION:  1.  No acute intracranial findings. 2.  Remote left frontoparietal infarction.  Original Report Authenticated By: Suzy Bouchard, M.D.   Mr Angiogram Head Wo Contrast  04/23/2011  *RADIOLOGY REPORT*  Clinical Data:  Slurred speech and left-sided weakness.  MRI HEAD WITHOUT CONTRAST MRA HEAD WITHOUT CONTRAST  Technique:  Multiplanar, multiecho pulse sequences of the brain and surrounding structures were obtained without intravenous contrast. Angiographic images of the head were obtained using MRA technique without contrast.  Comparison:   04/22/2011 CT head.  MRI HEAD  Findings:  The patient was moderately uncooperative and could not remain motionless for the study.  Images are suboptimal.  Small or subtle lesions could be overlooked.  There is a  large area of acute infarction affecting the right insular cortex, right posterior frontal cortex, right superior temporal cortex, and underlying white matter.  There is no associated hemorrhage.  There is no hydrocephalus, mass lesion, or midline shift.  No extra-axial fluid collections are  seen. There is moderate atrophy with chronic microvascular ischemic change.  There is a large remote left parietal infarct with gliosis and encephalomalacia.  The carotid and basilar arteries are patent based on flow void signal.  Pituitary and cerebellar tonsils are unremarkable.  There is no sinus or mastoid disease. 1 cm intraparotid lymph node suggested on the right.  Compared to prior CT, the acute infarction is not visible.  IMPRESSION: Large acute right hemisphere MCA territory infarct without hemorrhage or midline shift.  Atrophy and small vessel disease.  MRA HEAD  Findings: Mild nonstenotic irregularity both carotid siphons.  No supraclinoid ICA stenosis.  Basilar artery widely patent vertebrals codominant.  There is no proximal anterior, middle, or posterior cerebral artery stenosis. Mild irregularity of the distal MCA and PCA branches suggests intracranial atherosclerotic change, with a reduced number of M3 vessels on the right as compared to the left, correlating with the distribution of acute infarction.  IMPRESSION: No proximal carotid or basilar stenosis.  Reduced number of M3 vessels into the right middle cerebral artery territory.  Mild irregularity of the distal MCA and PCA branches which are visualized suggest intracranial atherosclerotic change.  Original Report Authenticated By: Staci Righter, M.D.   Mri Brain Without Contrast  04/23/2011  *RADIOLOGY REPORT*  Clinical Data:  Slurred speech and left-sided weakness.  MRI HEAD WITHOUT CONTRAST MRA HEAD WITHOUT CONTRAST  Technique:  Multiplanar, multiecho pulse sequences of the brain and surrounding structures were obtained without intravenous contrast. Angiographic images of the head were obtained using MRA technique without contrast.  Comparison:   04/22/2011 CT head.  MRI HEAD  Findings:  The patient was moderately uncooperative and could not remain motionless for the study.  Images are suboptimal.  Small or subtle lesions could be  overlooked.  There is a large area of acute infarction affecting the right insular cortex, right posterior frontal cortex, right superior temporal cortex, and underlying white matter.  There is no associated hemorrhage.  There is no hydrocephalus, mass lesion, or midline shift.  No extra-axial fluid collections are seen. There is moderate atrophy with chronic microvascular ischemic change.  There is a large remote left parietal infarct with gliosis and encephalomalacia.  The carotid and basilar arteries are patent based on flow void signal.  Pituitary and cerebellar tonsils are unremarkable.  There is no sinus or mastoid disease. 1 cm intraparotid lymph node suggested on the right.  Compared to prior CT, the acute infarction is not visible.  IMPRESSION: Large acute right hemisphere MCA territory infarct without hemorrhage or midline shift.  Atrophy and small vessel disease.  MRA HEAD  Findings: Mild nonstenotic irregularity both carotid siphons.  No supraclinoid ICA stenosis.  Basilar artery widely patent vertebrals codominant.  There is no proximal anterior, middle, or posterior cerebral artery stenosis. Mild irregularity of the distal MCA and PCA branches suggests intracranial atherosclerotic change, with a reduced number of M3 vessels on the right as compared to the left, correlating with the distribution of acute infarction.  IMPRESSION: No proximal carotid or basilar stenosis.  Reduced number of M3 vessels into the right middle cerebral artery territory.  Mild irregularity of the distal MCA and PCA branches which are visualized suggest intracranial atherosclerotic change.  Original  Report Authenticated By: Staci Righter, M.D.   Dg Foot Complete Left  04/22/2011  *RADIOLOGY REPORT*  Clinical Data: Great toe wound, evaluate for osteomyelitis  LEFT FOOT - COMPLETE 3+ VIEW  Comparison: MRI dated 06/27/2008  Findings: Soft tissue injury along the medial aspect of the first distal phalanx.  No definite  underlying cortical irregularity to suggest osteomyelitis.  Prior resection of the second ray.  No fracture or dislocation is seen.  Plantar and posterior calcaneal enthesopathy.  Vascular calcifications.  IMPRESSION: Soft tissue injury along the medial aspect of the first distal phalanx.  No radiographic evidence of osteomyelitis.  Original Report Authenticated By: Julian Hy, M.D.    Lab Results: Results for orders placed during the hospital encounter of 04/22/11 (from the past 48 hour(s))  ANTITHROMBIN III     Status: Normal   Collection Time   04/24/11 12:05 PM      Component Value Range Comment   AntiThromb III Func 98  75 - 120 (%)   PROTEIN C ACTIVITY     Status: Normal   Collection Time   04/24/11 12:05 PM      Component Value Range Comment   Protein C Activity 82  75 - 133 (%)   PROTEIN C, TOTAL     Status: Normal   Collection Time   04/24/11 12:05 PM      Component Value Range Comment   Protein C, Total 111  72 - 160 (%)   PROTEIN S ACTIVITY     Status: Abnormal   Collection Time   04/24/11 12:05 PM      Component Value Range Comment   Protein S Activity 67 (*) 69 - 129 (%)   PROTEIN S, TOTAL     Status: Normal   Collection Time   04/24/11 12:05 PM      Component Value Range Comment   Protein S Ag, Total 121  60 - 150 (%)   LUPUS ANTICOAGULANT PANEL     Status: Abnormal   Collection Time   04/24/11 12:05 PM      Component Value Range Comment   PTT Lupus Anticoagulant 40.2  28.0 - 43.0 (secs)    PTTLA Confirmation NOT APPL  <8.0 (secs)    PTTLA 4:1 Mix NOT APPL  28.0 - 43.0 (secs)    Drvvt 43.1 (*) 34.1 - 42.2 (secs)    Drvvt confirmation NOT APPL  <1.16 (Ratio)    dRVVT Incubated 1:1 Mix 37.2  34.1 - 42.2 (secs)    Lupus Anticoagulant NOT DETECTED  NOT DETECTED    HOMOCYSTEINE, SERUM     Status: Normal   Collection Time   04/24/11 12:05 PM      Component Value Range Comment   Homocysteine-Norm 12.0  4.0 - 15.4 (umol/L)   C3 COMPLEMENT     Status: Normal     Collection Time   04/24/11 12:05 PM      Component Value Range Comment   C3 Complement 148  90 - 180 (mg/dL)   C4 COMPLEMENT     Status: Abnormal   Collection Time   04/24/11 12:05 PM      Component Value Range Comment   Complement C4, Body Fluid 51 (*) 10 - 40 (mg/dL)   ANA     Status: Normal   Collection Time   04/24/11 12:05 PM      Component Value Range Comment   ANA NEGATIVE  NEGATIVE    SEDIMENTATION RATE     Status:  Abnormal   Collection Time   04/24/11 12:05 PM      Component Value Range Comment   Sed Rate 130 (*) 0 - 22 (mm/hr)   RPR     Status: Normal   Collection Time   04/24/11 12:05 PM      Component Value Range Comment   RPR NON REACTIVE  NON REACTIVE    GLUCOSE, CAPILLARY     Status: Abnormal   Collection Time   04/24/11  4:34 PM      Component Value Range Comment   Glucose-Capillary 168 (*) 70 - 99 (mg/dL)    Comment 1 Notify RN     GLUCOSE, CAPILLARY     Status: Abnormal   Collection Time   04/24/11  9:50 PM      Component Value Range Comment   Glucose-Capillary 158 (*) 70 - 99 (mg/dL)   CBC     Status: Abnormal   Collection Time   04/25/11  5:51 AM      Component Value Range Comment   WBC 11.3 (*) 4.0 - 10.5 (K/uL)    RBC 3.23 (*) 3.87 - 5.11 (MIL/uL)    Hemoglobin 8.6 (*) 12.0 - 15.0 (g/dL)    HCT 27.4 (*) 36.0 - 46.0 (%)    MCV 84.8  78.0 - 100.0 (fL)    MCH 26.6  26.0 - 34.0 (pg)    MCHC 31.4  30.0 - 36.0 (g/dL)    RDW 15.9 (*) 11.5 - 15.5 (%)    Platelets 477 (*) 150 - 400 (K/uL)   BASIC METABOLIC PANEL     Status: Abnormal   Collection Time   04/25/11  5:51 AM      Component Value Range Comment   Sodium 134 (*) 135 - 145 (mEq/L)    Potassium 5.7 (*) 3.5 - 5.1 (mEq/L)    Chloride 104  96 - 112 (mEq/L)    CO2 21  19 - 32 (mEq/L)    Glucose, Bld 148 (*) 70 - 99 (mg/dL)    BUN 37 (*) 6 - 23 (mg/dL)    Creatinine, Ser 2.04 (*) 0.50 - 1.10 (mg/dL)    Calcium 8.8  8.4 - 10.5 (mg/dL)    GFR calc non Af Amer 28 (*) >90 (mL/min)    GFR calc  Af Amer 32 (*) >90 (mL/min)   GLUCOSE, CAPILLARY     Status: Abnormal   Collection Time   04/25/11  6:53 AM      Component Value Range Comment   Glucose-Capillary 142 (*) 70 - 99 (mg/dL)   GLUCOSE, CAPILLARY     Status: Abnormal   Collection Time   04/25/11 11:02 AM      Component Value Range Comment   Glucose-Capillary 128 (*) 70 - 99 (mg/dL)   GLUCOSE, CAPILLARY     Status: Normal   Collection Time   04/25/11  4:28 PM      Component Value Range Comment   Glucose-Capillary 97  70 - 99 (mg/dL)   GLUCOSE, CAPILLARY     Status: Abnormal   Collection Time   04/25/11  9:05 PM      Component Value Range Comment   Glucose-Capillary 154 (*) 70 - 99 (mg/dL)   CREATININE, SERUM     Status: Abnormal   Collection Time   04/26/11  6:23 AM      Component Value Range Comment   Creatinine, Ser 1.68 (*) 0.50 - 1.10 (mg/dL)    GFR calc non Af Wyvonnia Lora  35 (*) >90 (mL/min)    GFR calc Af Amer 41 (*) >90 (mL/min)   GLUCOSE, CAPILLARY     Status: Abnormal   Collection Time   04/26/11  7:00 AM      Component Value Range Comment   Glucose-Capillary 125 (*) 70 - 99 (mg/dL)   GLUCOSE, CAPILLARY     Status: Abnormal   Collection Time   04/26/11 11:25 AM      Component Value Range Comment   Glucose-Capillary 163 (*) 70 - 99 (mg/dL)    Recent Results (from the past 240 hour(s))  MRSA PCR SCREENING     Status: Normal   Collection Time   04/22/11 10:29 PM      Component Value Range Status Comment   MRSA by PCR NEGATIVE  NEGATIVE  Final      Hospital Course:  1. large MCA stroke. Patient was admitted from the emergency room after she was admitted to [facility] in April 22, 2011. Initially she had only minor deficits and was thought that she may have only a TIA. The patient was evaluated by Dr. Caffie Damme on April 22, 2011 at 8 PM. She felt that the patient has a TIA type syndrome with clumsy hand dysarthria. He was also noted that the patient was having renal insufficiency hence  she was not felt to be a good candidate for CT angiogram. On April 23, 2011 at 8:40 AM he was noted to the patient's left-sided weakness has worsened. At that point it was determined that the patient must have an MRI to look for a possibility of a stroke. The same day at 1 PM the MRI resulted back with a large right MCA stroke and the patient was transferred to [facility]. Complete workup was initiated including hypercoagulable panel. Transesophageal echocardiogram was obtained showing severely depressed ejection fraction and blood pooling. Consultation with neurology and cardiology was obtained. The plan is to anticoagulate the patient to prevent another stroke. 2. Cardiomyopathy: During this admission the patient was diagnosed with a severe dilated cardiopathy. Etiology is uncertain. It was felt that the patient was too high risk for cardiac catheterization due to her recent stroke. Plan is to start medical treatment with beta blocker angiotensin receptor blocker and Lasix and followup as an outpatient to see if the ejection fraction improves.  3. ingrown toenail podiatry consult has been obtained  Discharge Exam: Blood pressure 132/85, pulse 88, temperature 97.7 F (36.5 C), temperature source Oral, resp. rate 20, height 5\' 7"  (1.702 m), weight 123.2 kg (271 lb 9.7 oz), SpO2 100.00%.  Alert oriented dysarthric but not aphasic Following commands   0/5 strength left side 5 out of 5 strength right side Lungs clear  Cor: regular  Disposition: To inpatient rehabilitation     Follow-up Information    Follow up with FRY,STEPHEN A.   Contact information:   [facility] [phone]       Follow up with Forbes Cellar, MD. Make an appointment in 2 months.   Contact information:   [address], [facility] [place] [phone]          Signed: Edythe Lynn 04/26/2011, 11:58  AM

## 2011-04-26 NOTE — Progress Notes (Signed)
Physical Therapy Treatment Patient Details Name: [name] MRN: [id number] DOB: 1960-09-30 Today's Date: 04/26/2011  PT Assessment/Plan  PT - Assessment/Plan Comments on Treatment Session: Discussed with patient and sister activity progress as well as need for lift for OOB.  RN aware. PT Plan: Discharge plan remains appropriate;Frequency remains appropriate PT Frequency: Min 4X/week Follow Up Recommendations: Inpatient Rehab Equipment Recommended: Defer to next venue PT Goals  Acute Rehab PT Goals PT Goal Formulation: With patient/family Time For Goal Achievement: 2 weeks Pt will go Supine/Side to Sit: with mod assist;with rail PT Goal: Supine/Side to Sit - Progress: Progressing toward goal Pt will go Sit to Supine/Side: with mod assist;with rail PT Goal: Sit to Supine/Side - Progress: Progressing toward goal Pt will Transfer Sit to Stand/Stand to Sit: with upper extremity assist;with mod assist PT Transfer Goal: Sit to Stand/Stand to Sit - Progress: Progressing toward goal Pt will Transfer Bed to Chair/Chair to Bed: with mod assist PT Transfer Goal: Bed to Chair/Chair to Bed - Progress: Progressing toward goal Pt will Ambulate: 1 - 15 feet;with max assist;with rolling walker PT Goal: Ambulate - Progress: Progressing toward goal  PT Treatment Precautions/Restrictions  Precautions Precautions: Fall Required Braces or Orthoses: No Restrictions Weight Bearing Restrictions: No  0/10 pain with treat. Mobility (including Balance) Bed Mobility Bed Mobility: Yes Rolling Right: 1: +2 Total assist;Patient percentage (comment) (+2 total assist (pt=20%)) Rolling Right Details (indicate cue type and reason): Assist to flex left LE and bring left UE across chest.  Facilitation to rotate trunk.  Max cues for sequence. Rolling Left: 1: +2 Total assist;Patient percentage (comment) (+2 total assist (pt=35%)) Rolling Left Details (indicate cue type and reason): Assist to flex left LE and to  position left UE in a protected position.  Facilitation to trunk to rotate.  Max cues for sequence. Supine to Sit: 1: +2 Total assist;Patient percentage (comment);HOB elevated (Comment degrees) (+2 total assist (pt=30%); HOB 45 degrees.) Supine to Sit Details (indicate cue type and reason): Assist to trunk to facilitate anterior translation and to bilateral LEs to move to EOB.  Max cues for sequence. Sit to Supine - Left: 1: +2 Total assist;Patient percentage (comment);HOB flat (+2 total assist (pt=20%)) Sit to Supine - Left Details (indicate cue type and reason): Assist to slow descent of trunk to bed and for bilateral LEs to translate onto bed.  Max cues for sequence. Transfers Transfers: No Ambulation/Gait Ambulation/Gait: No Stairs: No Wheelchair Mobility Wheelchair Mobility: No  Posture/Postural Control Posture/Postural Control: Postural limitations Postural Limitations: Pt with significant left lean and impaired posture, collapsing trunk forward. Max manual and verbal cues for upright and midline, decreasing to Mod A. Possibly pushing L. Diplopia present as well as decreased visual tracking right to left.  Only able to reach center with eyes (ie-left neglect). Balance Balance Assessed: Yes Static Sitting Balance Static Sitting - Balance Support: Right upper extremity supported;Feet supported Static Sitting - Level of Assistance: 2: Max assist;Other (comment) (See postural limitations section.) Static Sitting - Comment/# of Minutes: 10 Exercise    End of Session PT - End of Session Activity Tolerance: Patient tolerated treatment well Patient left: in bed;with call bell in reach;with bed alarm set;with family/visitor present Nurse Communication: Need for lift equipment General Behavior During Session: [facility] for tasks performed Cognition: Impaired (Slow to process information.)  Cyndia Bent 04/26/2011, 9:56 AM  04/26/2011 Cyndia Bent, PT, DPT [phone]

## 2011-04-26 NOTE — Plan of Care (Signed)
Overall Plan of Care [facility]) Patient Details Name: [name] MRN: [id number] DOB: 13-Nov-1960  Diagnosis:    Primary Diagnosis:    CVA (cerebral infarction) Co-morbidities: Morbid obese, CVA, DM, EF 20%, cardiomyopathy, pulmonary congestion.  Functional Problem List  Patient demonstrates impairments in the following areas: Balance, Behavior, Bladder, Bowel, Cognition, Edema, Endurance, Medication Management, Motor, Nutrition, Pain, Perception, Safety, Sensory  and Vision  Basic ADL's: eating, grooming, bathing, dressing and toileting Advanced ADL's: simple meal preparation and laundry  Transfers:  bed mobility, bed to chair, car and furniture Locomotion:  ambulation and wheelchair mobility  Additional Impairments:  Functional use of upper extremity, Swallowing, Communication  expression and Social Cognition   problem solving, memory, attention and awareness  Anticipated Outcomes Item Anticipated Outcome  Eating/Swallowing  Swallow without aspiration, Self feed, Supervision-minimal assist  Basic self-care  Min assist  Tolieting  Mis assist  Bowel/Bladder  Continence  Transfers  Min assist  Locomotion  Min assist W/C level  Communication  Supervision  Cognition  Minimal assist  Pain  Pain at tolerable level for comfort  Safety/Judgment  No injuries  Other     Therapy Plan:  PT minimum 5x week, 1-2 sessions, 60-90 minutes  OT minimum 5x week, 1-2 sessions, 60-90 minutes SLP minimum 5x week, 1-2 sessions, 60-90 minutes     Team Interventions: Item RN PT OT SLP SW TR Other  Self Care/Advanced ADL Retraining  x x      Neuromuscular Re-Education  x x      Therapeutic Activities  x x x     UE/LE Strength Training/ROM  x x      UE/LE Coordination Activities  x x      Visual/Perceptual Remediation/Compensation  x x      DME/Adaptive Equipment Instruction  x x      Therapeutic Exercise  x x      Balance/Vestibular Training  x x      Patient/Family Education  x x x      Cognitive Remediation/Compensation  x x x     Functional Mobility Training  x x      Ambulation/Gait Training  x       IT trainer  x       Wheelchair Propulsion/Positioning  x       Functional IT sales professional Reintegration  x       Dysphagia/Aspiration Precaution Training    x     Speech/Language Facilitation    x     Bladder Management x        Bowel Management x        Disease Management/Prevention x        Pain Management x        Medication Management x        Skin Care/Wound Management x        Splinting/Orthotics  x       Discharge Planning     x    Psychosocial Support     x                       Team Discharge Planning: Destination:  Home Projected Follow-up:  Home Health with SLP, PT, OT Projected Equipment Needs:  Hospital Bed, Sliding Board and Wheelchair Patient/family involved in discharge planning:  Yes  MD ELOS: 4 wks Medical Rehab Prognosis:  Good Assessment: R MCA infarct with Left hemiparesis and neglect.  Expect  prolonged recovery requiring length of stay listed above.

## 2011-04-27 DIAGNOSIS — G8110 Spastic hemiplegia affecting unspecified side: Secondary | ICD-10-CM

## 2011-04-27 DIAGNOSIS — I6330 Cerebral infarction due to thrombosis of unspecified cerebral artery: Secondary | ICD-10-CM

## 2011-04-27 LAB — DIFFERENTIAL
Basophils Absolute: 0 10*3/uL (ref 0.0–0.1)
Basophils Relative: 1 % (ref 0–1)
Eosinophils Absolute: 0.2 10*3/uL (ref 0.0–0.7)
Monocytes Absolute: 1 10*3/uL (ref 0.1–1.0)
Monocytes Relative: 13 % — ABNORMAL HIGH (ref 3–12)
Neutrophils Relative %: 58 % (ref 43–77)

## 2011-04-27 LAB — COMPREHENSIVE METABOLIC PANEL
Albumin: 1.9 g/dL — ABNORMAL LOW (ref 3.5–5.2)
Alkaline Phosphatase: 97 U/L (ref 39–117)
BUN: 34 mg/dL — ABNORMAL HIGH (ref 6–23)
CO2: 22 mEq/L (ref 19–32)
Chloride: 108 mEq/L (ref 96–112)
GFR calc non Af Amer: 42 mL/min — ABNORMAL LOW (ref >90)
Potassium: 4.6 mEq/L (ref 3.5–5.1)
Total Bilirubin: 0.3 mg/dL (ref 0.3–1.2)

## 2011-04-27 LAB — CBC
HCT: 28.6 % — ABNORMAL LOW (ref 36.0–46.0)
Hemoglobin: 8.8 g/dL — ABNORMAL LOW (ref 12.0–15.0)
MCV: 86.4 fL (ref 78.0–100.0)
RBC: 3.31 MIL/uL — ABNORMAL LOW (ref 3.87–5.11)
RDW: 16.2 % — ABNORMAL HIGH (ref 11.5–15.5)
WBC: 7.6 10*3/uL (ref 4.0–10.5)

## 2011-04-27 LAB — GLUCOSE, CAPILLARY
Glucose-Capillary: 162 mg/dL — ABNORMAL HIGH (ref 70–99)
Glucose-Capillary: 78 mg/dL (ref 70–99)
Glucose-Capillary: 96 mg/dL (ref 70–99)

## 2011-04-27 LAB — OCCULT BLOOD X 1 CARD TO LAB, STOOL: Fecal Occult Bld: NEGATIVE

## 2011-04-27 LAB — PROTIME-INR: INR: 1.41 (ref 0.00–1.49)

## 2011-04-27 MED ORDER — WARFARIN SODIUM 10 MG PO TABS
10.0000 mg | ORAL_TABLET | Freq: Once | ORAL | Status: AC
  Administered 2011-04-27: 10 mg via ORAL
  Filled 2011-04-27: qty 1

## 2011-04-27 MED ORDER — MUPIROCIN 2 % EX OINT
1.0000 "application " | TOPICAL_OINTMENT | Freq: Two times a day (BID) | CUTANEOUS | Status: DC
  Administered 2011-04-27 – 2011-05-16 (×33): 1 via TOPICAL
  Filled 2011-04-27 (×5): qty 22

## 2011-04-27 NOTE — Progress Notes (Signed)
ANTICOAGULATION CONSULT NOTE - Follow Up Consult  Pharmacy Consult for Coumadin Indication: Stroke  Allergies  Allergen Reactions  . Ace Inhibitors Shortness Of Breath  . Vancomycin Other (See Comments)    Unknown     Patient Measurements: Height: 5\' 7"  (170.2 cm) Weight: 275 lb 5.7 oz (124.9 kg) IBW/kg (Calculated) : 61.6    Vital Signs: Temp: 98.5 F (36.9 C) (11/16 0500) Temp src: Oral (11/16 0500) BP: 115/80 mmHg (11/16 0500) Pulse Rate: 111  (11/16 0500)  Labs:  Basename 04/27/11 0700 04/26/11 0623 04/25/11 0551  HGB 8.8* -- 8.6*  HCT 28.6* -- 27.4*  PLT 466* -- 477*  APTT -- -- --  LABPROT 17.5* -- --  INR 1.41 -- --  HEPARINUNFRC -- -- --  CREATININE 1.44* 1.68* 2.04*  CKTOTAL -- -- --  CKMB -- -- --  TROPONINI -- -- --   Estimated Creatinine Clearance: 65.5 ml/min (by C-G formula based on Cr of 1.44).   Medications:  Scheduled:    . amitriptyline  50 mg Oral QHS  . antiseptic oral rinse  15 mL Mouth Rinse BID  . aspirin  325 mg Oral Daily  . citalopram  10 mg Oral QHS  . cycloSPORINE  1 drop Both Eyes BID  . enoxaparin  40 mg Subcutaneous Q24H  . ferrous sulfate  325 mg Oral BID  . fluconazole  100 mg Oral Daily  . furosemide  20 mg Oral Daily  . gabapentin  600 mg Oral TID  . glimepiride  2 mg Oral Q breakfast  . insulin aspart  0-15 Units Subcutaneous TID WC & HS  . losartan  100 mg Oral Daily  . metoprolol  75 mg Oral BID  . mupirocin ointment  1 application Topical BID  . patient's guide to using coumadin book   Does not apply Once  . simvastatin  20 mg Oral q1800  . warfarin  10 mg Oral ONCE-1800  . warfarin   Does not apply Once   Goal of Therapy:  INR 2-3   Assessment & Plan:  50yo female with stroke.  INR 1.41 this AM with stable CBC.  No bleeding problems noted.  1.  Coumadin 10mg  2.  F/U AM  Benjamine Strout P 04/27/2011,2:28 PM

## 2011-04-27 NOTE — Plan of Care (Signed)
Problem: RH PAIN MANAGEMENT Goal: RH STG PAIN MANAGED AT OR BELOW PT'S PAIN GOAL Pt pain will be managed at a 3 or less

## 2011-04-27 NOTE — Progress Notes (Signed)
Patient information reviewed and entered into U[facility]-PRO system by Jessee Mezera, RN, CRRN, PPS Coordinator.  Information including medical coding and functional independence measure will be reviewed and updated through discharge.    

## 2011-04-27 NOTE — Progress Notes (Signed)
Social Work Assessment and Plan Assessment and Plan  Patient Name: [name]  [id number] Date: 04/27/2011  Problem List:  Patient Active Problem List  Diagnoses  . DIABETES MELLITUS, TYPE II  . HYPERLIPIDEMIA  . ANXIETY  . DEPRESSION  . NEUROPATHY, IDIOPATHIC PERIPHERAL NOS  . GERD  . DEGENERATIVE JOINT DISEASE  . Lumbago  . HEADACHE  . OBESITY  . ANEMIA  . HYPERTENSION  . CVA  . DVT  . RENAL FAILURE  . OSTEOMYELITIS  . SLEEP APNEA  . TACHYCARDIA  . CVA (cerebral vascular accident)  . Tachycardia  . Cardiomyopathy  . CVA (cerebral infarction)    Past Medical History:  Past Medical History  Diagnosis Date  . HTN (hypertension)   . HLD (hyperlipidemia)   . History of methicillin resistant staphylococcus aureus (MRSA)   . Sleep apnea   . CVA (cerebral infarction)   . Anemia   . Tachycardia   . Obesity   . Renal failure     due to vanc toxicity  . Osteomyelitis   . Lower back pain     Chronic  . Idiopathic peripheral neuropathy     NOS  . DJD (degenerative joint disease)   . Headache   . GERD (gastroesophageal reflux disease)   . DM type 2 (diabetes mellitus, type 2)   . Depression   . Anxiety   . Hypokalemia     resolved  . Cerebral infarct     left parietal and bilateral  . Esophagitis     distal  . Fluid overload     compensated  . Urinary retention   . H/O: pneumonia   . History of bacteremia   . Surgery, other elective     of the ear  . Stroke     Hx of left frontoparietal stroke in 12/11. History of right brain stroke.    Past Surgical History:  Past Surgical History  Procedure Date  . Tympanoplasty   . Toe amputation     left 2nd toe  . Dilation and curettage of uterus     history    Discharge Planning  Discharge Planning Living Arrangements: Family members Support Systems: Children;Family members;Church/faith community;Friends/neighbors Assistance Needed: Will require 24 hour care at discharge, sister is aware of this. Do  you have any problems obtaining your medications?: No Type of Residence: Private residence [place]: No Patient expects to be discharged to:: Home with sister and other family members providing assistance Expected Discharge Date: 05/21/11 Case Management Consult Needed: Yes (Comment) (RNCM already following)  Social/Family/Support Systems Social/Family/Support Systems Patient Roles: Spouse;Other (Comment) Forensic scientist) Contact Information: [name] [phone] cell Anticipated Caregiver: Sister, son and other family members Anticipated Caregiver's Contact Information: see above Ability/Limitations of Caregiver: Sister to have discussion with family who can assit her with pt's care. Caregiver Availability: 24/7  Employment Status Employment Status Employment Status: Disabled Date Retired/Disabled/Unemployed: 05/2010 Legal Hisotry/Current Legal Issues: No issues Guardian/Conservator: None  Abuse/Neglect Abuse/Neglect Assessment (Assessment to be complete while patient is alone) Physical Abuse: Denies Verbal Abuse: Denies Sexual Abuse: Denies Exploitation of patient/patient's resources: Denies Self-Neglect: Denies  Emotional Status Emotional Status Pt's affect, behavior adn adjustment status: Pt is motivated to improve this tiome and realizes how much work it takes. Last time pt had difficulty participating in the rehab program. Sister reports she is in a much better place this time. Recent Psychosocial Issues: Deficits from her previous CVA last year, but had made improvement. Pyschiatric History: Hx- depression/anxiety takes meds  for this and feels well controlled. Deferred BDI will continue to monitor due to she is aware of her deficits.  Patient/Family Perceptions, Expectations & Goals Pt/Family Perceptions, Expectations and Goals Pt/Family understanding of illness & functional limitations: Pt and sister have a good understanding of her CVA and her  deficits. Both are optimistic regarding her recovery. sister is hwere to observe in therapies. 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, Warehouse manager, Retiree, etc Anticipated changes in roles/activities/participation: Plans to resume at discharge Pt/family expectations/goals: Pt reports: " I want to get back to where I was, but I think I need  a scooter now." Sister reports: "She is in a better place this time about rehab and plans to work hard while she is here."  [employer]: None Premorbid Home Care/DME Agencies: None Transportation available at discharge: Family Resource referrals recommended: Support group (specify) (CVA Support Group)  Discharge Research scientist (life sciences) Resources: [employer] (specify county) Sports coach) Museum/gallery curator Resources: Tax inspector Screen Referred: No Living Expenses: Lives with family Money Management: Family Home Management: Sister but pt did assiist with some. Patient/Family Preliminary Plans: Return home with sister and other family member's assistance. Sister is aware she will require 24 hour care.  Sister to tlak with other family members regarding the help they can provide to her once home. Will look into PCS services for pt and talk with PT about if pt is appropriate for a scooter.   Clinical Impression:  Pleasant female who acknowledges she is ready to work because she realizes how much it takes. She feels she did not take advantage of the program last time.  Sister is at bedside and very supportive and encouraging. Will monitor pt's coping with her history of depression and awareness of her deficits.  Familiar with rehab program.      [name] 04/27/2011

## 2011-04-27 NOTE — Progress Notes (Signed)
  Subjective/Complaints: No new complaints, doesn't know how she feels yet."Can I be on an antidepressant?"  Objective: Vital Signs: Blood pressure 115/80, pulse 111, temperature 98.5 F (36.9 C), temperature source Oral, resp. rate 20, height 5\' 7"  (1.702 m), weight 124.9 kg (275 lb 5.7 oz), SpO2 93.00%. No results found.  Basename 04/27/11 0700 04/25/11 0551  WBC 7.6 11.3*  HGB 8.8* 8.6*  HCT 28.6* 27.4*  PLT 466* 477*    Basename 04/27/11 0700 04/26/11 0623 04/25/11 0551  NA 137 -- 134*  K 4.6 -- 5.7*  CL 108 -- 104  CO2 22 -- 21  GLUCOSE 146* -- 148*  BUN 34* -- 37*  CREATININE 1.44* 1.68* --  CALCIUM 8.5 -- 8.8    Physical Exam: General appearance: morbidly obese, flat affect. Resp: clear to auscultation bilaterally Cardio: regular rate and rhythm GI: soft, non-tender; bowel sounds normal; no masses,  no organomegaly Extremities: no edema, redness or tenderness in the calves or thighs Pulses: 2+ and symmetric Skin: Skin color, texture, turgor normal. No rashes or lesions Neurologic: Gait: working on pregait activities. Alert and oriented X 3, flat affect, left inattention, left hemiparesis UE>LE.  Left hemisensory deficits.  Poor awareness . Assessment/Plan: 1. Functional deficits secondary to  Right Cardioembolic CVA  which require 3+ hours per day of interdisciplinary therapy in a comprehensive inpatient rehab setting. Physiatrist is providing close team supervision and 24 hour management of active medical problems listed below. Physiatrist and rehab team continue to assess barriers to discharge/monitor patient progress toward functional and medical goals. Mobility:         ADL:   Cognition: Cognition Orientation Level: Oriented X4 Cognition Orientation Level: Oriented X4  2. Anticoagulation/DVT prophylaxis with Pharmaceutical: Coumdin .  Continue aspirin and Lovenox  till INR therapeutic.  3. Pain Management: Monitor for now for OA symptoms.  Patient  Active Hospital Problem List: DIABETES MELLITUS, TYPE II (01/03/2007)   Assessment: blood sugars 12-150 range   Plan: Amaryl added for control. Monitor over next few days. HYPERLIPIDEMIA (01/03/2007)   Assessment:    Plan: continue zocor DEPRESSION (01/03/2007)   Assessment: flat and seems disinterested.   Plan: Started on celexa.  Ego support by team.  SW to follow up today. NEUROPATHY, IDIOPATHIC PERIPHERAL NOS (01/07/2007)   Assessment: Pain BLE especially with increased activity per patient   Plan: Continue neurontin ANEMIA (07/07/2010)   Assessment: ?etiology.    Plan: Anemia panel with low iron stores.  Continue iron supplement.  Will monitor H and H for now.  Check stool guaiacs.   HYPERTENSION (07/07/2010)   Assessment:  systolic's resonalble.  Monitor for trends.   Plan: Continue losartan, metoprolol, furosemide Cardiomyopathy (04/25/2011)    POA: Unknown   Assessment: EF @ 20-25%.  Likely contributing to SOB with activity.   Plan: Monitor for symptoms.  Monitor for signs of overload.  Will add low salt and CM restrictions. Dysphagia:  On DI diet due to pocketing and impulsivity.    [name] 04/27/2011, 9:07 AM

## 2011-04-27 NOTE — Progress Notes (Signed)
Physical Therapy Session Note  Patient Details  Name: [name] MRN: [id number] Date of Birth: Dec 02, 1960  Today's Date: 04/27/2011 Time: 1330-1405 Time Calculation (min): 35 min  Precautions: Precautions Precautions: Fall Required Braces or Orthoses: No (Recommend PRAFO for left LE) Restrictions Weight Bearing Restrictions: No Other Position/Activity Restrictions: diplopia, homonimous hemianopsia, left inattention, left UE sensory loss, left hemiplegia, apraxia  Short Term Goals: PT Short Term Goal 1: Pt will roll left with max assist, roll right with mod assist. PT Short Term Goal 2: Pt will supine-sit with max assist. PT Short Term Goal 3: Pt will transfer with max assist. PT Short Term Goal 4: W/C mobility x 50 feet mod assist in controlled environment. PT Short Term Goal 5: Tolerate standing > 3 min with mechanical lift vs. total assist +2 to initiate weight bearing and pregait activities.  Skilled Therapeutic Interventions/Progress Updates:     Pain Pain Assessment Pain Assessment: 0-10 Pain Score:   0  Bed mobility rolling left max assist, facilitation for sequencing and trunk rotation to achieve side-lying. Decreased sustained attention to task, requiring mod cues for attention. Supine-sit total assist + 2, pt = < 25%, NDT facilitation at head and pelvis for weight shift and trunk alignment. Sitting EOB, midline orientation activities with facilitation for trunk and head alignment. Initial sitting balance max assist with significant posterior left lean, balance reactions absent. Pt perceives midline to be left of midline in upright, unsupported sitting. Slideboard transfer to W/C with total assist + 1, facilitation for sequencing and weight shift. W/C mobility training x 50 feet using right hemi-technique max assist, focus on sustained attention to task and sequencing. Sister present for session this afternoon, she is very supportive. Sister reports pt sat in slouched posture  prior to most recent CVA due to lumbar pain from work-related injury. Pt previously worked full time as Quarry manager.        Therapy/Group: Individual Therapy  Kendrick [place] 04/27/2011, 2:36 PM

## 2011-04-27 NOTE — Consult Note (Signed)
Reason for Consult:Infected, Ingrown Left Hallux Nail Referring Physician: Dr. Marye Round  HPI: [name] is an 50 y.o. female. Patient complains of ingrowing Hallux nail Left foot.  Patients nurse states she has noticed pus from the nail bed and nursing team has been soaking in vinegar water soaks with no improvement in appearance of toe   Past Medical History  Diagnosis Date  . HTN (hypertension)   . HLD (hyperlipidemia)   . History of methicillin resistant staphylococcus aureus (MRSA)   . Sleep apnea   . CVA (cerebral infarction)   . Anemia   . Tachycardia   . Obesity   . Renal failure     due to vanc toxicity  . Osteomyelitis   . Lower back pain     Chronic  . Idiopathic peripheral neuropathy     NOS  . DJD (degenerative joint disease)   . Headache   . GERD (gastroesophageal reflux disease)   . DM type 2 (diabetes mellitus, type 2)   . Depression   . Anxiety   . Hypokalemia     resolved  . Cerebral infarct     left parietal and bilateral  . Esophagitis     distal  . Fluid overload     compensated  . Urinary retention   . H/O: pneumonia   . History of bacteremia   . Surgery, other elective     of the ear  . Stroke     Hx of left frontoparietal stroke in 12/11. History of right brain stroke.    Past Surgical History  Procedure Date  . Tympanoplasty   . Toe amputation     left 2nd toe  . Dilation and curettage of uterus     history    Family History  Problem Relation Age of Onset  . Coronary artery disease    . Diabetes      Social History:  reports that she has never smoked. She has never used smokeless tobacco. She reports that she drinks alcohol. She reports that she does not use illicit drugs.  Allergies:  Allergies  Allergen Reactions  . Ace Inhibitors Shortness Of Breath  . Vancomycin Other (See Comments)    Unknown     Medications: I have reviewed the patient's current medications.  Blood pressure 115/80, pulse 111, temperature 98.5 F  (36.9 C), temperature source Oral, resp. rate 20, height 5\' 7"  (1.702 m), weight 275 lb 5.7 oz (124.9 kg), SpO2 93.00%.  Examination: Patient seen at bedside to be awake and alert, cooperative and having her lunch.  Patient relates pain in Left Great toe.  Vascular exam reveals non- palpable pedal pulses Left- DP or PT.  Digital hair growth absent, cool to cool from proximal to distal.  Capillary refill time decreased and upon thorough debridement of nail, no bleeding was encountered. Neurological exam is decreased secondary to stroke.  Patient able to note pain with pressure however light touch is absent.  Musculoskeletal exam revels a flaccid Left Foot- patient unable to lift foot or move digits.  Dermatological exam reveals ingrowing Left hallux nail with minimal drainage encountered with debridement of hallux nail.  No redness, no swelling, no calor noted, no malodor-- Upon debridement of nail, positive probe to bone on distal medial aspect of hallux is encountered.    Assessment/Plan: Ingrown, Infected Hallux nail Left with positive probe to bone.  Plan- because of non-palpable pedal pulses and questionable blood supply to the foot, the nail was debrided back in  and thus abscess was drained.  X-rays from 04/22/11 were reviewed and report is negative for osteomyelitis.  Will order bactroban ointment and dry dressing q daily. Will reassess on Monday 04/30/2011 Rebeka Kimble P 04/27/2011, 11:34 AM

## 2011-04-27 NOTE — Progress Notes (Addendum)
[facility] Individual Statement of Services  Patient Name:  Lizzett Quigg  Date:  04/27/2011  Welcome to the [place] [facility].  Our goal is to provide you with an individualized program based on your diagnosis and situation, designed to meet your specific needs.  With this comprehensive rehabilitation program, you will be expected to participate in at least 3 hours of rehabilitation therapies Monday-Friday, with modified therapy programming on the weekends.  Estimated length of stay: 4 weeks Overall predicted outcome: Minimum assistance. Wheelchair mobility.  Your rehabilitation program will include the following services:  Physical Therapy (PT), Occupational Therapy (OT), Speech Therapy (ST), 24 hour per day rehabilitation nursing, Therapeutic Recreaction (TR), Case Management (RN and Education officer, museum), Rehabilitation Medicine, Nutrition Services and Pharmacy Services  Weekly team conferences will be held on Wednesdays to discuss your progress.  Your RN Case Writer will talk with you frequently to get your input and to update you on team discussions.  Team conferences with you and your family in attendance may also be held.  Depending on your progress and recovery, your program may change.  Your RN Case Engineer, production will coordinate services and will keep you informed of any changes.  Your RN Tourist information centre manager and SW names and contact numbers are listed  below.  The following services may also be recommended but are not provided by the [facility] will be made to provide these services after discharge if needed.  Arrangements include referral to agencies that provide these services.  Your insurance has been verified to be:  [employer] Your primary doctor is:   Dr. Sarajane Jews  Pertinent information will be shared with your doctor and your insurance company.  Case Manager: Jesse Fall, [facility] [phone]  Social Worker:  Ovidio Kin, [place]  Information discussed with pt's sister and pt and copy given to patient by: [name], 04/27/2011, 11:38 AM

## 2011-04-27 NOTE — Progress Notes (Signed)
Speech Language Pathology Assessment & Plan  Speech Language Pathology Assessment and Plan  SLP Diagnosis: cognitive deficits, aphasia, dysarthria, dysphagia  Clinical Impressions: [name] is an 50 y.o. female with H/O HTN, DM, 12/11 CVA with residual left sided weakness and mild expressive aphasia. Admitted 11/11 with slurred speech, and slight facial droop. With a Patient with worsening of left sided weakness past discharge. CT head done and without acute changes. MRI brain with large acute right MCA infarct affecting insular cortex, posterior frontal cortex, superior temporal cortex and underlying white matter. Patient transferred to [facility] 04/26/11 and no present with deficits (see below) which impact her ability to safely eat, complete BADLs and express her wants and needs.    Rehab Potential: good for goals   ELOS: 3-4 weeks  Therapy Start Time/End Time: Time Calculation Start Time: 1400 Stop Time: 1500 Time Calculation (min): 60 min  Pain: Pain Assessment Pain Assessment: No/denies pain Pain Score: 0-No pain  Cognitive-linguistic Assessment Prior Functioning:  Prior Functional Status Cognitive/Linguistic Baseline: Baseline deficits (mild expressive aphasia) Baseline deficit details: occasional paraphasias in conversation Type of Home: House Lives With: Family (sister [place]) [place] Help From: Family (sister Karie Kirks and son Clifton James) Vocation: On disability Cognition: Cognition Overall Cognitive Status: Impaired Arousal/Alertness: Other (comment) (tired) Orientation Level: Oriented to person;Oriented to place;Oriented to situation;Disoriented to time Attention: Sustained Sustained Attention: Impaired Sustained Attention Impairment: Verbal basic;Functional basic Memory: Impaired Memory Impairment: Storage deficit;Retrieval deficit;Decreased recall of new information Awareness: Impaired Awareness Impairment: Intellectual impairment (of swalloing precautions and physical  deficits) Problem Solving: Impaired Problem Solving Impairment: Functional basic;Verbal basic Behaviors: Perseveration;Other (comment) (flat affect) Comprehension: Auditory Comprehension Yes/No Questions: Impaired Basic Immediate Environment Questions: 25-49% accurate Commands: Impaired Two Step Basic Commands: 50-74% accurate Multistep Basic Commands: 0-24% accurate Other Conversation Comments: basic/simple conversation WFL Interfering Components: Attention;Processing speed EffectiveTechniques: Extra processing time;Repetition;Visual/Gestural cues Visual Recognition/Discrimination Discrimination: Exceptions to [facility] Common Objects: Able in field of 2 Pictures: Not tested Reading Comprehension Reading Status: Not tested Expression: Expression Primary Mode of Expression: Verbal Verbal Expression Initiation: Impaired (mild-moderate impariment) Level of Generative/Spontaneous Verbalization: Conversation (mild-moderately impaired) Naming: Impairment Responsive: 76-100% accurate Confrontation: Within functional limits (basic items) Common Objects: Able in field of 2 Pictures: Not tested Convergent: Not tested Divergent: Not tested Other Naming Comments: paraphasias present appear to be baseline Verbal Errors: Phonemic paraphasias;Perseveration;Not aware of errors Pragmatics: Impairment Impairments: Abnormal affect;Eye contact;Monotone Interfering Components: Attention;Premorbid deficit Effective Techniques: Semantic cues Written Expression Dominant Hand: Right Written Expression: Not tested Oral/Motor: Oral Motor/Sensory Function Labial ROM: Reduced left Labial Symmetry: Abnormal symmetry left Labial Strength: Reduced Labial Sensation: Reduced Lingual ROM: Reduced left Lingual Symmetry: Abnormal symmetry left Lingual Strength: Reduced Lingual Sensation: Reduced Facial ROM: Reduced left Facial Symmetry: Left drooping eyelid Facial Strength: Reduced Facial Sensation:  Reduced Velum:  (NT) Mandible: Within Functional Limits Motor Speech Intelligibility: Intelligibility reduced  Assessment/Plan:  Assessment Clinical Impression Statement: Patient presents with a baseline mild expessive aphasia with new cognitive deficits.  Cognitive deficits are characterized by moderately severe attention, awareness, recall, and problem solving impariments.  Addiitoanlly, patient presents with mild-moderate dysarthria .  SLP Recommendation/Assessment:  (Skilled SLP services needed during CIR stay.) Problem List: Orientation;Attention;Memory;Problem Solving;Verbal expression Therapy Diagnosis: Dysarthria;Aphasia;Cognitive Impairments;Speech and Language deficits  Cognitive-Linguistic Short-Term Goals Patient will: 1. Demonstrate problem solving with moderate assist semantic cues with BADLs. 2. Demonstrate sustained attention to BADL for 5-8 minutes with moderate semantic cues. 3. Label 1 physical and 1 cognitive deficit with minimal assist semantic cues. 4. Verbally express needs/wants  with minimal assist sematic cues to increase vocal intensity.  Plan Speech Therapy Frequency: min 5x/week, 1-2 sessions, 60-90 minutes Duration: 3-4 weeks Treatment/Interventions: Environmental controls;Cueing hierarchy;Cognitive reorganization;Internal/external aids;Functional tasks;SLP instruction and feedback;Compensatory strategies;Patient/family education;Language facilitation;Other (comment) (group dysphagia treatment) Potential to Achieve Goals: Good Potential Considerations: Ability to learn/carryover information;Co-morbidities;Previous level of function;Severity of impairments Recommendation Follow up Recommendations: Home health SLP Equipment Recommended: None recommended by SLP Individuals Consulted Consulted and Agree with Results and Recommendations: Patient;Family member/caregiver Family Member Consulted : sister  Clinical/Bedside Swallow Evaluation Patient Details    Name: [name] MRN: [id number] DOB: 06-27-1960 Today's Date: 04/27/2011  Past Medical History:  Past Medical History  Diagnosis Date  . HTN (hypertension)   . HLD (hyperlipidemia)   . History of methicillin resistant staphylococcus aureus (MRSA)   . Sleep apnea   . CVA (cerebral infarction)   . Anemia   . Tachycardia   . Obesity   . Renal failure     due to vanc toxicity  . Osteomyelitis   . Lower back pain     Chronic  . Idiopathic peripheral neuropathy     NOS  . DJD (degenerative joint disease)   . Headache   . GERD (gastroesophageal reflux disease)   . DM type 2 (diabetes mellitus, type 2)   . Depression   . Anxiety   . Hypokalemia     resolved  . Cerebral infarct     left parietal and bilateral  . Esophagitis     distal  . Fluid overload     compensated  . Urinary retention   . H/O: pneumonia   . History of bacteremia   . Surgery, other elective     of the ear  . Stroke     Hx of left frontoparietal stroke in 12/11. History of right brain stroke.   Past Surgical History:  Past Surgical History  Procedure Date  . Tympanoplasty   . Toe amputation     left 2nd toe  . Dilation and curettage of uterus     history    Assessment/Recommendations/Treatment Plan Suspected Esophageal Findings Suspected Esophageal Findings: Belching (history of GERD)  SLP Assessment Clinical Impression Statement: Patient presents with moderately severe oral and cognitive based dysphagia characterized by left oral weakness, a fast rate of intake with large bites and sips.  With consistent cues patient able to self feed.   Risk for Aspiration: Mild Other Related Risk Factors: History of pneumonia;History of dysphagia;History of GERD;Previous CVA;Cognitive impairment  Recommendations Solid Consistency: Dysphagia 1 (Puree) Liquid Consistency: Thin Liquid Administration via: Cup;Straw Medication Administration: Whole meds with puree Supervision: Full supervision/cueing for  compensatory strategies (patient able to self feed with assist) Compensations: Slow rate;Small sips/bites;Check for pocketing;Check for anterior loss Postural Changes and/or Swallow Maneuvers: Out of bed for meals;Upright 30-60 min after meal;Seated upright 90 degrees Oral Care Recommendations: Oral care BID Other Recommendations: Other (Comment) (no ice )  Prognosis Prognosis for Safe Diet Advancement: Good Barriers to Reach Goals: Cognitive deficits Barriers/Prognosis Comment: cognitive deficits impact aspiration risk; however, full supervision ensre safety with p.o. intake.  Individuals Consulted Consulted and Agree with Results and Recommendations: Family member/caregiver;Patient;RN Family Member Consulted: sister  Swallow Study: Short-term Goals  SLP Swallowing Goals 5. Patient will consume recommended diet without observed clinical signs of aspiration with: Minimal assistance;Minimal cueing 6. Patient will recall and utilize recommended strategies during swallow to increase swallowing safety with: Minimal assistance;Minimal cueing  Swallow Study Prior Functional Status  Cognitive/Linguistic Baseline: Baseline deficits (mild  expressive aphasia) Baseline deficit details: occasional paraphasias in conversation Type of Home: House Lives With: Family (sister Karie Kirks) Vocation: On disability  General  Date of Onset: 04/22/11 Other Pertinent Information: Patient transferred to [place] 04/26/11 and evalutaion conducted Type of Study: Bedside swallow evaluation Diet Prior to this Study: Dysphagia 1 (puree);Thin liquids Temperature Spikes Noted: No Respiratory Status: Room air History of Intubation: No Behavior/Cognition: Cooperative;Requires cueing;Other (comment) (tired) Oral Cavity - Dentition: Dentures, top;Poor condition Vision: Impaired for self-feeding Patient Positioning: Upright in chair Baseline Vocal Quality: Normal Volitional Cough: Weak Volitional Swallow: Able to  elicit Ice chips: Not tested  Oral Motor/Sensory Function  Labial ROM: Reduced left Labial Symmetry: Abnormal symmetry left Labial Strength: Reduced Labial Sensation: Reduced Lingual ROM: Reduced left Lingual Symmetry: Abnormal symmetry left Lingual Strength: Reduced Lingual Sensation: Reduced Facial ROM: Reduced left Facial Symmetry: Left drooping eyelid Facial Strength: Reduced Facial Sensation: Reduced Velum:  (NT) Mandible: Within Functional Limits  Consistency Results  Ice Chips Ice chips: Not tested  Thin Liquid Thin Liquid: Within functional limits Presentation: Cup;Straw Oral Phase Functional Implications: Left anterior spillage Pharyngeal  Phase Impairments: Multiple swallows;Cough - Delayed Other Comments: cough elicited with mixed consistencies sip with ice and thin; cue effective at reminding patient to not eat the ice  Nectar Thick Liquid Nectar Thick Liquid: Not tested  Honey Thick Liquid Honey Thick Liquid: Not tested  Puree Puree: Within functional limits Presentation: Self Fed (hand over hand to use left upper extremity) Oral Phase Impairments: Reduced lingual movement/coordination Oral Phase Functional Implications: Left anterior spillage;Left lateral sulci pocketing;Oral residue  Solid Solid: Not tested  Recommendations for other services: Other: none at this time  Discharge Criteria: Patient will be discharged from SLP if patient refuses treatment 3 consecutive times without medical reason, if treatment goals not met, if there is a change in medical status, if patient makes no progress towards goals or if patient is discharged from hospital.  The above assessment, treatment plan, treatment alternatives and goals were discussed and mutually agreed upon: by family and patient    Elysia Grand 04/27/2011 3:39 PM

## 2011-04-27 NOTE — Progress Notes (Signed)
Occupational Therapy Assessment and Plan  Patient Details  Name: [name] MRN: [id number] Date of Birth: 1961/02/21  OT Diagnosis: apraxia, disturbance of vision, hemiplegia affecting non-dominant side and lumbago (low back pain) Rehab Potential: Rehab Potential: Good ELOS: 4 weeks   Assessment & Plan Clinical Impression: Patient is a 50 y.o. year old female with recent admission to the hospital on 04/23/11 with left sided weakness and mild aphasia, slurred speech, and slight facial droop.  Patient transferred to CIR on 04/26/2011 .  Patient's past medical history is significant for HTN, DM, EF 30-35% with severe hypokinesis, tachycardia, hyperlipidemia, MRSA, anemia, obesity, renal failure, osteomyelitis, chronic low back pain, DJD, left 2nd toe amputation and bilat frontoparietal embolic CVAs in December of 2011 admitted to [facility] 04/26/11 s/p large acute embolic right MCA CVA. PTA pt lived with sister in a single level home with three steps to enter. Pt was supervision with gait in home environment with no assistive device, min assist for stair negotiation with railing, and supervision gait with RW community distances due to left sided weakness from prior CVA. Pt currently presents to CIR with left sided hemiplegia, diplopia and homonimous hemianopsia, motor apraxia, decreased sustained attention, right sided dysmetria, decreased sustained left sided activation, flaccid left upper extremity, left upper extremity sensory loss, absent balance reactions,decreased postural control frequently collapsing left and anterior,left inattention to body and space, decreased muscular endurance, and overall decreased activity tolerance resulting in decreased functional independence. Pt is currently total assist for basic self-cares due to above deficits.  Patient currently requires total with basic self-care skills secondary to ataxia and decreased coordination secondary to LUE hemiplegia and visual deficits of  diplopia and homonomous hemianopsia.  Prior to hospitalization, patient could complete basic self-cares with min assist, primarily at sink level.  Patient will benefit from skilled intervention to decrease level of assist with basic self-care skills prior to discharge home with care partner.  Anticipate patient will require 24 hour supervision and follow up home health.  OT - End of Session Activity Tolerance: Tolerates 10 - 20 min activity with multiple rests Endurance Deficit: Yes OT Assessment Rehab Potential: Good Barriers to Discharge:  (? w/c accessibility, specifically in bathroom) OT Plan OT Frequency: 1-2 X/day, 60-90 minutes Estimated Length of Stay: 4 weeks OT Treatment/Interventions: Balance/vestibular training;DME/adaptive equipment instruction;Functional mobility training;Cognitive remediation/compensation;Neuromuscular re-education;Patient/family education;Self Care/advanced ADL retraining;Therapeutic Activities;Therapeutic Exercise;UE/LE Strength taining/ROM;UE/LE Coordination activities;Visual/perceptual remediation/compensation  Precautions/Restrictions  Precautions Precautions: Fall Required Braces or Orthoses: No (Recommend PRAFO for left LE) Restrictions Weight Bearing Restrictions: No Other Position/Activity Restrictions: diplopia, homonimous hemianopsia, left inattention, left UE sensory loss, left hemiplegia, apraxia General Chart Reviewed: Yes Family/Caregiver Present: Yes (sister) Pain Pain Assessment Pain Assessment: 0-10 Pain Score:   5 Pain Type: Acute pain Pain Location: Foot Pain Orientation: Left Pain Descriptors: Aching Pain Onset: On-going Pain Intervention(s): RN made aware (premedicated) Multiple Pain Sites: No Home Living/Prior Functioning Home Living Lives With: Family (sister [place]) Receives Help From: Family (sister Karie Kirks and son Clifton James) Type of Home: House Home Layout: One level Home Access: Stairs to enter Entrance Stairs-Rails:  Psychiatric nurse of Steps: 3 at front door, 3 at [place], bilat rails both locations likely too far apart to reach both. No STE from carport, but 2 steps immediately inside [place] door. Bathroom Shower/Tub: Scientist, research (life sciences):  (? accessibility if requires 22" w/c) Home Adaptive Equipment: Walker - rolling;Shower chair with back;Bedside commode/3-in-1 Prior Function Level of Independence: Other (comment) (supervision household gait no DME, RW community  distances) Bath: Minimal Able to Take Stairs?: Yes (with min assist of sister and railing) Leisure: Hobbies-yes (Comment) (makes blankets and enjoys reading romance novels) ADL ADL Grooming: Minimal cueing;Maximal assistance Where Assessed-Grooming: Wheelchair Upper Body Bathing: Minimal cueing;Moderate assistance Where Assessed-Upper Body Bathing: Wheelchair Lower Body Bathing: Dependent (+2) Where Assessed-Lower Body Bathing: Standing at sink;Sitting at sink Upper Body Dressing: Unable to assess (no clothes on eval) Lower Body Dressing: Unable to assess (no clothes on eval) Toileting: Unable to assess (incontinent of bowel during session) Toilet Transfer: Unable to assess Tub/Shower Transfer: Unable to assess Vision/Perception  Vision - History Patient Visual Report: Diplopia;Peripheral vision impairment Vision - Assessment Eye Alignment: Impaired (comment) Vision Assessment: Vision impaired - to be further tested in functional context Alignment/Gaze Preference: Chin down;Gaze right;Head tilt Tracking/Visual Pursuits: Impaired - to be further tested in functional context Diplopia Assessment: Present all the time/all directions Additional Comments: Pt unable to track past midline towards left, can track from right to left until midline.  Able to turn head past midline with mod verbal cues to compensate Perception Perception: Impaired Inattention/Neglect: Does not attend to left visual  field;Does not attend to left side of body Praxis Praxis: Not tested  Cognition Orientation Level: Oriented X4 Sensation Sensation Light Touch: Impaired Detail Light Touch Impaired Details: Absent LUE (to light touch and deep pressure) Stereognosis: Not tested Hot/Cold: Not tested Proprioception: Impaired Detail Proprioception Impaired Details: Absent LUE Coordination Gross Motor Movements are Fluid and Coordinated: No Fine Motor Movements are Fluid and Coordinated: No Coordination and Movement Description: Motor apraxia noted. Dysmetria right UE and LE. No active movement left side. May also be impacted by visual deficits (see vision section). Finger Nose Finger Test: Impaired.  Pt with increased difficulty gazing to midline Motor  Motor Motor: Hemiplegia;Abnormal tone;Abnormal postural alignment and control;Motor apraxia;Motor impersistence Motor - Skilled Clinical Observations: decreased sustained left trunk activation, decreased postural control, poor graded movements Mobility  Bed Mobility Bed Mobility: Yes Rolling Right: 1: +1 Total assist Supine to Sit: 1: +2 Total assist;Patient percentage (comment);Other (comment) (pt = < 25 %) Transfers Transfers: Yes Sit to Stand: 1: +2 Total assist;Patient percentage (comment) (< 25%) Sit to Stand Details: Tactile cues for initiation;Tactile cues for weight shifting;Tactile cues for placement;Tactile cues for weight beaing;Verbal cues for technique;Manual facilitation for weight shifting;Manual facilitation for placement Stand to Sit: 1: +2 Total assist Stand to Sit Details (indicate cue type and reason): Verbal cues for precautions/safety;Verbal cues for sequencing  Trunk/Postural Assessment  Cervical Assessment Cervical Assessment: Exceptions to [facility] (forward, rotated right) Thoracic Assessment Thoracic Assessment:  (flexed, increased left lateral flexion, right rotation) Lumbar Assessment Lumbar Assessment:  (flexed throughout,  increased left lateral flexion) Postural Control Postural Control: Deficits on evaluation Postural Limitations: decreased left sided elongation, decreased sustained left trunk activation. Significant left lean, poor graded movements frequently collapsing left and anterior, especially with divided attention.  Extremity/Trunk Assessment RUE Assessment RUE Assessment: Exceptions to WFL (ROM WFL, strength grossly 4/5) LUE Assessment LUE Assessment: Exceptions to [facility] (flaccid, PROM WFL, strength 0/5)  Treatment session: 1000-1100. Focus on ADL assessment and retraining at sink level from w/c.  Pt required total assist +2 sit to stand, static standing, and stand to sit with pt performing <25%.  Pt required verbal and visual cues to find center of body using mirror to assist; pt demonstrated increased difficulty with this secondary to diplopia and homonomous hemianopsia and decreased balance reactions.  Mod assist UB bathing, total assist LB bathing, and unable to assess  dressing due to no clothes available on eval.  Tactile and verbal cues for pt to obtain midline sitting balance in wheelchair to correct posterior pelvic tilt in sitting.  Discharge Criteria: Patient will be discharged from OT if patient refuses treatment 3 consecutive times without medical reason, if treatment goals not met, if there is a change in medical status, if patient makes no progress towards goals or if patient is discharged from hospital.  The above assessment, treatment plan, treatment alternatives and goals were discussed and mutually agreed upon: by family  Arne Cleveland. 04/27/2011, 11:57 AM

## 2011-04-27 NOTE — Progress Notes (Signed)
Pt alert and oriented times four,  Pt has left sided neglect and weakness.  Pt stood with the sara lift and two assist, had small soft BM in brief and commode. Pt is able to feed self after being set up and needs cues to eat. Podiatrist came in and cut toe nail off from left great toe,  Dressing applied

## 2011-04-27 NOTE — Progress Notes (Signed)
Subjective/Complaints: No new complaints, doesn't know how she feels yet.  Objective: Vital Signs: Blood pressure 115/80, pulse 111, temperature 98.5 F (36.9 C), temperature source Oral, resp. rate 20, height 5\' 7"  (1.702 m), weight 124.9 kg (275 lb 5.7 oz), SpO2 93.00%. No results found.  Basename 04/25/11 0551  WBC 11.3*  HGB 8.6*  HCT 27.4*  PLT 477*    Basename 04/26/11 0623 04/25/11 0551  NA -- 134*  K -- 5.7*  CL -- 104  CO2 -- 21  GLUCOSE -- 148*  BUN -- 37*  CREATININE 1.68* 2.04*  CALCIUM -- 8.8    Physical Exam: General appearance: morbidly obese, flat affect. Resp: clear to auscultation bilaterally Cardio: regular rate and rhythm GI: soft, non-tender; bowel sounds normal; no masses,  no organomegaly Extremities: no edema, redness or tenderness in the calves or thighs Pulses: 2+ and symmetric Skin: Skin color, texture, turgor normal. No rashes or lesions Neurologic: Gait: working on pregait activities. Alert and oriented X 3, flat affect, left inattention, left hemiparesis UE>LE.  Left hemisensory deficits.  Poor awareness . Assessment/Plan: 1. Functional deficits secondary to  Right Cardioembolic CVA  which require 3+ hours per day of interdisciplinary therapy in a comprehensive inpatient rehab setting. Physiatrist is providing close team supervision and 24 hour management of active medical problems listed below. Physiatrist and rehab team continue to assess barriers to discharge/monitor patient progress toward functional and medical goals. Mobility:         ADL:   Cognition: Cognition Orientation Level: Oriented X4 Cognition Orientation Level: Oriented X4  2. Anticoagulation/DVT prophylaxis with Pharmaceutical: Coumdin .  Continue aspirin and Lovenox  till INR therapeutic.  3. Pain Management: Monitor for now for OA symptoms.  Patient Active Hospital Problem List: DIABETES MELLITUS, TYPE II (01/03/2007)   Assessment: blood sugars 12-150 range  Plan: Amaryl added for control. Monitor over next few days. HYPERLIPIDEMIA (01/03/2007)   Assessment:    Plan: continue zocor DEPRESSION (01/03/2007)   Assessment: flat and seems disinterested.   Plan: Started on celexa.  Ego support by team.  SW to follow up today. NEUROPATHY, IDIOPATHIC PERIPHERAL NOS (01/07/2007)   Assessment: Pain BLE especially with increased activity per patient   Plan: Continue neurontin ANEMIA (07/07/2010)   Assessment: ?etiology.    Plan: Anemia panel with low iron stores.  Continue iron supplement.  Will monitor H and H for now.  Check stool guaiacs.   HYPERTENSION (07/07/2010)   Assessment:  systolic's resonalble.  Monitor for trends.   Plan: Continue losartan, metoprolol, furosemide Cardiomyopathy (04/25/2011)    POA: Unknown   Assessment: EF @ 20-25%.  Likely contributing to SOB with activity.   Plan: Monitor for symptoms.  Monitor for signs of overload.  Will add low salt and CM restrictions. Dysphagia:  On DI diet due to pocketing and impulsivity.    Bary Leriche 04/27/2011, 7:52 AM

## 2011-04-27 NOTE — Progress Notes (Addendum)
Brief Nutrition Note  RD to see patient 2/2 nutrition risk screen. Pt with dysphagia. Currently on Dysphagia 1 diet, thin liquids (with 2 gram sodium and CHO modified medium restriction) eating 50-100%. Sister at bedside reports wt has been mostly stable and has seen much improvement in PO intake during hospitalization.  Current weight at 275 lb (124.9 kg).    BMI: 43.1, pt is morbidly obese.  Labs and meds reviewed.  Patient is currently eating well. Ate 100% of breakfast per RN report. Wt stable. Consult RD for any nutrition needs/concerns.  [id number]

## 2011-04-27 NOTE — Progress Notes (Signed)
Physical Therapy Assessment and Plan  Patient Details  Name: [name] MRN: [id number] Date of Birth: 1960-08-28  PT Diagnosis: Abnormal posture, Abnormality of gait, Cognitive deficits, Contracture of joint: left heel cord, Coordination disorder, Hemiplegia non-dominant, Impaired cognition, Impaired sensation, Muscle weakness and Pain in left foot Rehab Potential: Good ELOS:  4 weeks  0800-0900 60 min   Assessment & Plan Clinical Impression:   Pt is a 50 y.o femal with history of HTN, DM, EF 30-35% with severe hypokinesis, tachycardia, hyperlipidemia, MRSA, anemia, obesity, renal failure, osteomyelitis, chronic low back pain, DJD, left 2nd toe amputation and bilat frontopariental embolic CVAs in December of 2011 admitted to [facility] 04/26/11 s/p large acute embolic right MCA CVA. PTA pt lived with sister in a single level home with three steps to enter. Pt was supervision with gait in home environment with no assistive device, min assist for stair negotiation with railing, and supervision gait with RW community distances due to left sided weakness from prior CVA. Pt currently presents to CIR with left sided hemiplegia, diplopia and homonimous hemianopsia, motor apraxia, decreased sustained attention, right sided dysmetria, decreased sustained left sided activation, absent balance reactions, poor graded weight shifts in all planes, decreased postural control frequently collapsing left and anterior, decreased bilat hip and left ankle ROM, left inattention to body and space, decreased muscular endurance, and overall decreased activity tolerance resulting in decreased functional independence. Pt is currently total assist for mobility as a result of these deficits.  Patient will benefit from skilled PT intervention to maximize safe functional mobility, minimize fall risk and decrease caregiver burden for planned discharge home with 24 hour assist.  Anticipate patient will benefit from follow up [facility] at  discharge.  PT - End of Session Activity Tolerance: Tolerates 10 - 20 min activity with multiple rests Endurance Deficit: Yes PT Assessment Rehab Potential: Good PT Plan PT Frequency: 1-2 X/day, 60-90 minutes PT Treatment/Interventions: Ambulation/gait training;Balance/vestibular training;Cognitive remediation/compensation;DME/adaptive equipment instruction;Community reintegration;Functional mobility training;Neuromuscular re-education;Pain management;Patient/family education;Therapeutic Exercise;Therapeutic Activities;Stair training;Splinting/orthotics;UE/LE Strength taining/ROM;UE/LE Coordination activities;Visual/perceptual remediation/compensation;Wheelchair propulsion/positioning  Precautions/Restrictions Precautions Precautions: Fall Required Braces or Orthoses: No (Recommend PRAFO for left LE) Restrictions Weight Bearing Restrictions: No Other Position/Activity Restrictions: left hemiplegia, diplopia, homonimous hemianopsia, left inattention, apraxia   Pain Pain Assessment Pain Assessment: 0-10 Pain Score:   8 Pain Type: Acute pain Pain Location: Foot Pain Orientation: Left Pain Descriptors: Aching Pain Onset: On-going Pain Intervention(s): RN made aware;Other (Comment) (medicated during session) Multiple Pain Sites: No Home Living/Prior Functioning Home Living Lives With: Family (sister [place]) [place] Help From: Family (sister Karie Kirks and son Clifton James) Type of Home: House Home Layout: One level Home Access: Stairs to enter Entrance Stairs-Rails: Psychiatric nurse of Steps: 3 at front door, 3 at [place], bilat rails both locations likely too far apart to reach both. No STE from carport, but 2 steps immediately inside [place] door. Bathroom Shower/Tub: Event organiser: Walker - rolling;Shower chair with back;Bedside commode/3-in-1 Prior Function Level of Independence: Other (comment) (supervision household gait no DME, RW  community distances) Able to Take Stairs?: Yes (with min assist of sister and railing) Leisure: Hobbies-yes (Comment) (makes blankets) Vision/Perception  Pt reports diplopia. Homonimous hemianopsia and decreased visual scanning and tracking left of midline.    Cognition Decreased sustained attention, easily distracted. Decreased problem solving, safety and emergent awareness and higher level executive functioning.   Sensation Sensation Light Touch: Impaired Detail Light Touch Impaired Details: Absent LLE (to light touch and deep pressure) Stereognosis: Not tested Hot/Cold:  Not tested Proprioception: Impaired Detail (absent left LE) Proprioception Impaired Details: Absent LLE Coordination Gross Motor Movements are Fluid and Coordinated: No Fine Motor Movements are Fluid and Coordinated: No Coordination and Movement Description: Motor apraxia noted. Dysmetria right UE and LE. No active movement left side. May also be impacted by visual deficits (see vision section). Motor  Motor Motor: Hemiplegia;Abnormal tone;Abnormal postural alignment and control;Motor apraxia;Motor impersistence Motor - Skilled Clinical Observations: decreased sustained left trunk activation, decreased postural control, poor graded movements  Mobility Bed Mobility Bed Mobility: Yes Rolling Right: 1: +1 Total assist Supine to Sit: 1: +2 Total assist;Patient percentage (comment);Other (comment) (pt = < 25 %) Transfers Transfers: Yes Lateral/Scoot Transfers: 1: +1 Total assist;With slide board;Other (comment) (chuck pad under pt to assist, transfer to pt's right side) Lateral/Scoot Transfer Details (indicate cue type and reason): decreased active left side initiation in weight bearing position, poor sustained activation, poor postural control, motor apraxia Locomotion  Ambulation Ambulation: No Gait Gait: No (unable to achieve standing at this time) Stairs / Additional Locomotion Stairs: No (unsafe to attempt  at this time, unable to achive standing) Wheelchair Mobility Wheelchair Mobility: No (Will be assessed during afternoon session)  Trunk/Postural Assessment  Cervical Assessment Cervical Assessment: Exceptions to [facility] (forward, rotated right) Thoracic Assessment Thoracic Assessment:  (flexed, increased left lateral flexion, right rotation) Lumbar Assessment Lumbar Assessment:  (flexed throughout, increased left lateral flexion) Postural Control Postural Control: Deficits on evaluation Postural Limitations: decreased left sided elongation, decreased sustained left trunk activation. Significant left lean, poor graded movements frequently collapsing left and anterior, especially with divided attention.  Balance Balance Balance Assessed: Yes Static Sitting Balance Static Sitting - Balance Support: Right upper extremity supported;Feet supported Static Sitting - Level of Assistance: 3: Mod assist;4: Min assist Static Sitting - Comment/# of Minutes: initially mod assist, progressed to min assist with facilitation for midline alignment Dynamic Sitting Balance Dynamic Sitting - Balance Support: Right upper extremity supported;Feet supported Dynamic Sitting - Level of Assistance: 2: Max assist Dynamic Sitting - Balance Activities: Forward lean/weight shifting;Reaching for objects;Lateral lean/weight shifting;Reaching across midline Dynamic Sitting - Comments: frequently collapses left and forward Extremity Assessment    RLE Strength RLE Overall Strength Comments: AROM WFL except decreased hip external rotation, hip flexion 3/5 otherwise 4/5. Dysmetria noted. LLE Strength LLE Overall Strength Comments: PROM WFL except ankle dorsiflexion to neurtal only, heel cord contracture developing and no passive hip internal rotation (recommend PRAFO for positioning in bed). Strength grossly 1/5 in weight bearing positions only. Unable to isolate or initiate in open chain  likely due to apraxia and attention  deficits.  Recommendations for other services: none at this time  Discharge Criteria: Patient will be discharged from PT if patient refuses treatment 3 consecutive times without medical reason, if treatment goals not met, if there is a change in medical status, if patient makes no progress towards goals or if patient is discharged from hospital.  The above assessment, treatment plan, treatment alternatives and goals were discussed and mutually agreed upon: by pt and sister  Treatment Initiated During Session: Seated reaching EOB, focus on reaching across midline and up to increase left sided trunk elongation and scanning to the left. Poor sustained postural control and trunk elongation on left noted. Pt able to attend to left with moderate cues and mod-max physical assist and manual facilitation. Slideboard transfer to pt's right side with use of over the back Bobath technique, facilitation for weight shift and sequencing of movement. Left LE muscle initiated  noted in weight bearing, however pt unable to sustain at this time. Decreased postural control with mobility task, frequently collapsing to the left and anteriorly. Sister present for session this am. Educated re: sitting on pt's left side and encouraging scanning and eye contact on her left to increase left sided attention and improve visual scanning. Sister verbalized agreement.  Kendrick Ranch 04/27/2011, 9:29 AM

## 2011-04-28 LAB — GLUCOSE, CAPILLARY
Glucose-Capillary: 96 mg/dL (ref 70–99)
Glucose-Capillary: 146 mg/dL — ABNORMAL HIGH (ref 70–99)

## 2011-04-28 LAB — PROTIME-INR
INR: 1.31 (ref 0.00–1.49)
Prothrombin Time: 16.5 seconds — ABNORMAL HIGH (ref 11.6–15.2)

## 2011-04-28 MED ORDER — WARFARIN SODIUM 2.5 MG PO TABS
12.5000 mg | ORAL_TABLET | Freq: Once | ORAL | Status: AC
  Administered 2011-04-28: 12.5 mg via ORAL
  Filled 2011-04-28: qty 1

## 2011-04-28 NOTE — Progress Notes (Signed)
Occupational Therapy Note  Patient Details  Name: [name] MRN: [id number] Date of Birth: 02/09/1961 Today's Date: 04/28/2011 Time:  3:30-4:15 Pain:  none Pt. Lying in bed.  Asked for bedpan.  Assisted pt with rolling to right with total assist; and to left with max assist.   Pt. Sat with HOB up and brushed teeth after set up.  Pt. Needed manual facilitation to reach for bed rails and roll hips.  She needed manual assist to reach with LUE.    Lisa Roca 04/28/2011, 4:32 PM

## 2011-04-28 NOTE — Progress Notes (Signed)
Subjective/Complaints: No new complaints, doesn't know how she feels yet.  Comfortable night.  Objective: Vital Signs: Blood pressure 134/92, pulse 87, temperature 98.2 F (36.8 C), temperature source Oral, resp. rate 20, height 5\' 7"  (1.702 m), weight 275 lb 5.7 oz (124.9 kg), SpO2 95.00%. No results found.  Basename 04/27/11 0700  WBC 7.6  HGB 8.8*  HCT 28.6*  PLT 466*   CBG (last 3)   Basename 04/27/11 2204 04/27/11 1638 04/27/11 1120  GLUCAP 96 78 162*   Results for orders placed during the hospital encounter of 04/26/11 (from the past 24 hour(s))  GLUCOSE, CAPILLARY     Status: Abnormal   Collection Time   04/27/11 11:20 AM      Component Value Range   Glucose-Capillary 162 (*) 70 - 99 (mg/dL)   Comment 1 Notify RN    GLUCOSE, CAPILLARY     Status: Normal   Collection Time   04/27/11  4:38 PM      Component Value Range   Glucose-Capillary 78  70 - 99 (mg/dL)   Comment 1 Notify RN    GLUCOSE, CAPILLARY     Status: Normal   Collection Time   04/27/11 10:04 PM      Component Value Range   Glucose-Capillary 96  70 - 99 (mg/dL)   Comment 1 Notify RN       Basename 04/27/11 0700 04/26/11 0623  NA 137 --  K 4.6 --  CL 108 --  CO2 22 --  GLUCOSE 146* --  BUN 34* --  CREATININE 1.44* 1.68*  CALCIUM 8.5 --    Physical Exam: General appearance: morbidly obese, flat affect. Resp: clear to auscultation bilaterally Cardio: regular rate and rhythm GI: soft, non-tender; bowel sounds normal; no masses,  no organomegaly Extremities: no edema, redness or tenderness in the calves or thighs Pulses: 2+ and symmetric Skin: Skin color, texture, turgor normal. No rashes or lesions Neurologic: Gait: working on pregait activities. Alert and oriented X 3, flat affect, left inattention, left hemiparesis UE>LE.  Left hemisensory deficits.  Poor awareness . Assessment/Plan: 1. Functional deficits secondary to  Right Cardioembolic CVA  which require 3+ hours per day of  interdisciplinary therapy in a comprehensive inpatient rehab setting. Physiatrist is providing close team supervision and 24 hour management of active medical problems listed below. Physiatrist and rehab team continue to assess barriers to discharge/monitor patient progress toward functional and medical goals. 2.  Diabetes- stable 3.  Anemia- unchanged Mobility: Bed Mobility Bed Mobility: Yes Rolling Right: 1: +1 Total assist Supine to Sit: 1: +2 Total assist;Patient percentage (comment);Other (comment) (pt = < 25 %) Transfers Transfers: Yes Sit to Stand: 1: +2 Total assist;Patient percentage (comment) (< 25%) Stand to Sit: 1: +2 Total assist Lateral/Scoot Transfers: 1: +1 Total assist;With slide board;Other (comment) (chuck pad under pt to assist, transfer to pt's right side) Lateral/Scoot Transfer Details (indicate cue type and reason): decreased active left side initiation in weight bearing position, poor sustained activation, poor postural control, motor apraxia Ambulation/Gait Stairs: No (unsafe to attempt at this time, unable to achive standing) Wheelchair Mobility Wheelchair Mobility: No (Will be assessed during afternoon session) ADL:   Cognition: Cognition Overall Cognitive Status: Impaired Arousal/Alertness: Other (comment) (tired) Orientation Level: Oriented to person;Oriented to place;Oriented to time Attention: Sustained Sustained Attention: Impaired Sustained Attention Impairment: Verbal basic;Functional basic Memory: Impaired Memory Impairment: Storage deficit;Retrieval deficit;Decreased recall of new information Awareness: Impaired Awareness Impairment: Intellectual impairment (of swalloing precautions and physical deficits) Problem Solving:  Impaired Problem Solving Impairment: Functional basic;Verbal basic Behaviors: Perseveration;Other (comment) (flat affect) Cognition Arousal/Alertness: Other (comment) (tired) Orientation Level: Oriented to person;Oriented to  place;Oriented to time  2. Anticoagulation/DVT prophylaxis with Pharmaceutical: Coumdin .  Continue aspirin and Lovenox  till INR therapeutic.  3. Pain Management: Monitor for now for OA symptoms.  Patient Active Hospital Problem List: DIABETES MELLITUS, TYPE II (01/03/2007)   Assessment: blood sugars 12-150 range   Plan: Amaryl added for control. Monitor over next few days. HYPERLIPIDEMIA (01/03/2007)   Assessment:    Plan: continue zocor DEPRESSION (01/03/2007)   Assessment: flat and seems disinterested.   Plan: Started on celexa.  Ego support by team.  SW to follow up today. NEUROPATHY, IDIOPATHIC PERIPHERAL NOS (01/07/2007)   Assessment: Pain BLE especially with increased activity per patient   Plan: Continue neurontin ANEMIA (07/07/2010)   Assessment: ?etiology.    Plan: Anemia panel with low iron stores.  Continue iron supplement.  Will monitor H and H for now.  Check stool guaiacs.   HYPERTENSION (07/07/2010)   Assessment:  systolic's resonalble.  Monitor for trends.   Plan: Continue losartan, metoprolol, furosemide Cardiomyopathy (04/25/2011)    POA: Unknown   Assessment: EF @ 20-25%.  Likely contributing to SOB with activity.   Plan: Monitor for symptoms.  Monitor for signs of overload.  Will add low salt and CM restrictions. Dysphagia:  On DI diet due to pocketing and impulsivity.    Nyoka Cowden 04/28/2011, 8:09 AM

## 2011-04-28 NOTE — Progress Notes (Signed)
Speech Language Pathology Speech Language Pathology Session Note   Skilled Therapeutic Interventions:Treatment focus on scanning to left environment/left attention to locate items. Total A verbal, visual, and tactile cues needed to locate pictures in left field. Max A visual and tactile cues for eyes to scan past midline. Naming pictures/items with Mod A semantic cues. Sorting tasks between two shapes with Max A visual and question cues to self-monitor and correct errors. Emergent awareness into difficulty of tasks. Sustained attention for ~2 minutes. Attention impacted by pain. Traced name with hand over hand assist.   Therapy Start Time/End Time: Time Calculation Start Time: 1300 Stop Time: 1400 Time Calculation (min): 60 min  Pain: Headache:7/10; RN notified and medications given   FIM: Comprehension Comprehension Mode: Auditory Comprehension: 3-Understands basic 50 - 74% of the time/requires cueing 25 - 50%  of the time Expression Expression Mode: Verbal Expression: 2-Expresses basic 25 - 49% of the time/requires cueing 50 - 75% of the time. Uses single words/gestures. Social Interaction Social Interaction: 2-Interacts appropriately 25 - 49% of time - Needs frequent redirection. Problem Solving Problem Solving Mode: Not assessed Problem Solving: 2-Solves basic 25 - 49% of the time - needs direction more than half the time to initiate, plan or complete simple activities Memory Memory Mode: Not assessed Memory: 2-Recognizes or recalls 25 - 49% of the time/requires cueing 51 - 75% of the time   Therapy/Group: Individual Therapy     [name] 04/28/2011 1:57 PM

## 2011-04-28 NOTE — Progress Notes (Signed)
Physical Therapy Session Note  Patient Details  Name: [name] MRN: [id number] Date of Birth: 1960/06/23  Today's Date: 04/28/2011 Time: [id number] Time Calculation (min): 60 min  Precautions: Precautions Precautions: Fall Precaution Comments: fall, obesity Required Braces or Orthoses: No (Recommend PRAFO for left LE) Restrictions Weight Bearing Restrictions: No Other Position/Activity Restrictions: diplopia, homonimous hemianopsia, left inattention, left UE sensory loss, left hemiplegia, apraxia  Short Term Goals: PT Short Term Goal 1: Pt will roll left with max assist, roll right with mod assist. PT Short Term Goal 2: Pt will supine-sit with max assist. PT Short Term Goal 3: Pt will transfer with max assist. PT Short Term Goal 4: W/C mobility x 50 feet mod assist in controlled environment. PT Short Term Goal 5: Tolerate standing > 3 min with mechanical lift vs. total assist +2 to initiate weight bearing and pregait activities.  Skilled Therapeutic Interventions/Progress Updates: Therapeutic activities to improve static,dynamic sitting balance; bed mobility; neuromuscular reed LLE     General Pt up in wc with Hoyer lift pad in place      Pain No complaint of pain  Bed mobility - rolling right with max assist to position Lt extremities and perform roll; rolling left max assist with tactile cues to position uninvolved LE; supine to sit max assist (pt 10% Transfers- Total assist of 2 with Maxilift       Trunk/Postural Assessment  Pt sits in posterior pelvic tilt    Balance (sitting) Pt requires assist to attain initial static sitting balance position; can maintain static sitting position with unilateral UE support; pt performed reaching activities in sitting using RUE- requires cues to visually locate objects initially     Exercises LLE neuro reed in sidelying (gravity eliminated)- Pt able to actively flex L hip approximately 50% range with manual support        Therapy/Group: Individual Therapy  Alyssia Heese,JIM 04/28/2011, 3:39 PM

## 2011-04-28 NOTE — Progress Notes (Signed)
ANTICOAGULATION CONSULT NOTE - Follow Up Consult  Pharmacy Consult:  Coumadin Indication:  CVA  Allergies  Allergen Reactions  . Ace Inhibitors Shortness Of Breath  . Vancomycin Other (See Comments)    Unknown     Patient Measurements: Height: 5\' 7"  (170.2 cm) Weight: 275 lb 5.7 oz (124.9 kg) IBW/kg (Calculated) : 61.6    Vital Signs: Temp: 98.2 F (36.8 C) (11/17 0512) Temp src: Oral (11/17 0512) BP: 134/92 mmHg (11/17 0512) Pulse Rate: 87  (11/17 0512)  Labs:  Basename 04/28/11 0750 04/27/11 0700 04/26/11 0623  HGB -- 8.8* --  HCT -- 28.6* --  PLT -- 466* --  APTT -- -- --  LABPROT 16.5* 17.5* --  INR 1.31 1.41 --  HEPARINUNFRC -- -- --  CREATININE -- 1.44* 1.68*  CKTOTAL -- -- --  CKMB -- -- --  TROPONINI -- -- --   Estimated Creatinine Clearance: 65.5 ml/min (by C-G formula based on Cr of 1.44).    Assessment: 19 YOF s/p CVA on Coumadin.  INR subtherapeutic and unexpectedly decreased in setting of drug-drug interaction with Diflucan.  Goal of Therapy:  INR 2-3   Plan:  Coumadin 12.5mg  PO today. Continue Lovenox until INR therapeutic. INR in AM.   Mina Marble, Simrit Gohlke Dien 04/28/2011,9:14 AM

## 2011-04-28 NOTE — Progress Notes (Signed)
Occupational Therapy Session Note  Patient Details  Name: [name] MRN: [id number] Date of Birth: May 02, 1961  Today's Date: 04/28/2011 Time: 0900-0950 Time Calculation (min): 50 min  Precautions: Precautions Precautions: Fall Precaution Comments: fall, obesity Required Braces or Orthoses: No (Recommend PRAFO for left LE) Restrictions Weight Bearing Restrictions: No Other Position/Activity Restrictions: diplopia, homonimous hemianopsia, left inattention, left UE sensory loss, left hemiplegia, apraxia    Skilled Therapeutic Interventions/Progress Updates:    Pt. Scheduled for OT for bathing and dressing for 45 minutes.  Pt. Was incontinent of bowel and bladder.  OT addressed bed mobility and functional ADL tasks.  Pt. Rolled to right with max assist.  Pt needed manual assist to use LUE during session.  Sister present during session.     Pain Pain Assessment Pain Assessment: 0-10 Pain Score: 0-No pain ADL ADL Pt. Incontinent of Bowel and bladder.     Upper Body Dressing: Unable to assess (no clothes on eval) Lower Body Dressing: Unable to assess (no clothes on eval) Toileting: Unable to assess (incontinent of bowel during session) Toilet Transfer: Unable to assess Tub/Shower Transfer: Unable to assess  Exercises    Other Treatments    Therapy/Group: Individual Therapy  Lisa Roca 04/28/2011, 12:49 PM

## 2011-04-29 ENCOUNTER — Inpatient Hospital Stay (HOSPITAL_COMMUNITY): Payer: Medicaid Other

## 2011-04-29 LAB — GLUCOSE, CAPILLARY

## 2011-04-29 MED ORDER — WARFARIN SODIUM 5 MG PO TABS
5.0000 mg | ORAL_TABLET | Freq: Once | ORAL | Status: AC
  Administered 2011-04-29: 5 mg via ORAL
  Filled 2011-04-29: qty 1

## 2011-04-29 NOTE — Progress Notes (Signed)
Subjective/Complaints: No new complaints, doesn't know how she feels yet.  Comfortable night.  Objective: Vital Signs: Blood pressure 125/75, pulse 76, temperature 97.1 F (36.2 C), temperature source Axillary, resp. rate 16, height 5\' 7"  (1.702 m), weight 275 lb 5.7 oz (124.9 kg), SpO2 94.00%. No results found.  Basename 04/27/11 0700  WBC 7.6  HGB 8.8*  HCT 28.6*  PLT 466*   CBG (last 3)   Basename 04/29/11 0746 04/28/11 2026 04/28/11 1633  GLUCAP 96 108* 99   Results for orders placed during the hospital encounter of 04/26/11 (from the past 24 hour(s))  OCCULT BLOOD X 1 CARD TO LAB, STOOL     Status: Normal   Collection Time   04/28/11  9:47 AM      Component Value Range   Fecal Occult Bld NEGATIVE    GLUCOSE, CAPILLARY     Status: Abnormal   Collection Time   04/28/11 11:21 AM      Component Value Range   Glucose-Capillary 146 (*) 70 - 99 (mg/dL)   Comment 1 Notify RN    GLUCOSE, CAPILLARY     Status: Normal   Collection Time   04/28/11  4:33 PM      Component Value Range   Glucose-Capillary 99  70 - 99 (mg/dL)   Comment 1 Notify RN    GLUCOSE, CAPILLARY     Status: Abnormal   Collection Time   04/28/11  8:26 PM      Component Value Range   Glucose-Capillary 108 (*) 70 - 99 (mg/dL)  GLUCOSE, CAPILLARY     Status: Normal   Collection Time   04/29/11  7:46 AM      Component Value Range   Glucose-Capillary 96  70 - 99 (mg/dL)   Comment 1 Notify RN       Basename 04/27/11 0700  NA 137  K 4.6  CL 108  CO2 22  GLUCOSE 146*  BUN 34*  CREATININE 1.44*  CALCIUM 8.5   BP Readings from Last 3 Encounters:  04/29/11 125/75  04/26/11 132/85  04/26/11 132/85   INR  Date Value Range Status  04/28/2011 1.31  0.00-1.49 (no units) Final      Physical Exam: General appearance: morbidly obese, flat affect. Resp: clear to auscultation bilaterally Cardio: regular rate and rhythm GI: soft, non-tender; bowel sounds normal; no masses,  no  organomegaly Extremities: no edema, redness or tenderness in the calves or thighs Pulses: 2+ and symmetric Skin: Skin color, texture, turgor normal. No rashes or lesions Neurologic: Gait: working on pregait activities. Alert and oriented X 3, flat affect, left inattention, left hemiparesis UE>LE.  Left hemisensory deficits.  Poor awareness . Assessment/Plan: 1. Functional deficits secondary to  Right Cardioembolic CVA  which require 3+ hours per day of interdisciplinary therapy in a comprehensive inpatient rehab setting. Physiatrist is providing close team supervision and 24 hour management of active medical problems listed below. Physiatrist and rehab team continue to assess barriers to discharge/monitor patient progress toward functional and medical goals. 2.  Diabetes- stable 3.  Anemia- unchanged 4.  HTN- controlled Mobility: Bed Mobility Bed Mobility: Yes Rolling Right: 1: +1 Total assist Supine to Sit: 1: +2 Total assist;Patient percentage (comment);Other (comment) (pt = < 25 %) Transfers Transfers: Yes Sit to Stand: 1: +2 Total assist;Patient percentage (comment) (< 25%) Stand to Sit: 1: +2 Total assist Lateral/Scoot Transfers: 1: +1 Total assist;With slide board;Other (comment) (chuck pad under pt to assist, transfer to pt's right  side) Lateral/Scoot Transfer Details (indicate cue type and reason): decreased active left side initiation in weight bearing position, poor sustained activation, poor postural control, motor apraxia Ambulation/Gait Stairs: No (unsafe to attempt at this time, unable to achive standing) Wheelchair Mobility Wheelchair Mobility: No (Will be assessed during afternoon session) ADL:   Cognition: Cognition Overall Cognitive Status: Impaired Arousal/Alertness: Other (comment) (tired) Orientation Level: Oriented to place;Disoriented to time Attention: Sustained Sustained Attention: Impaired Sustained Attention Impairment: Verbal basic;Functional  basic Memory: Impaired Memory Impairment: Storage deficit;Retrieval deficit;Decreased recall of new information Awareness: Impaired Awareness Impairment: Intellectual impairment (of swalloing precautions and physical deficits) Problem Solving: Impaired Problem Solving Impairment: Functional basic;Verbal basic Behaviors: Perseveration;Other (comment) (flat affect) Cognition Arousal/Alertness: Other (comment) (tired) Orientation Level: Oriented to place;Disoriented to time  2. Anticoagulation/DVT prophylaxis with Pharmaceutical: Coumdin .  Continue aspirin and Lovenox  till INR therapeutic.  3. Pain Management: Monitor for now for OA symptoms.  Patient Active Hospital Problem List: DIABETES MELLITUS, TYPE II (01/03/2007)   Assessment: blood sugars 12-150 range   Plan: Amaryl added for control. Monitor over next few days. HYPERLIPIDEMIA (01/03/2007)   Assessment:    Plan: continue zocor DEPRESSION (01/03/2007)   Assessment: flat and seems disinterested.   Plan: Started on celexa.  Ego support by team.  SW to follow up today. NEUROPATHY, IDIOPATHIC PERIPHERAL NOS (01/07/2007)   Assessment: Pain BLE especially with increased activity per patient   Plan: Continue neurontin ANEMIA (07/07/2010)   Assessment: ?etiology.    Plan: Anemia panel with low iron stores.  Continue iron supplement.  Will monitor H and H for now.  Check stool guaiacs.   HYPERTENSION (07/07/2010)   Assessment:  systolic's resonalble.  Monitor for trends.   Plan: Continue losartan, metoprolol, furosemide Cardiomyopathy (04/25/2011)    POA: Unknown   Assessment: EF @ 20-25%.  Likely contributing to SOB with activity.   Plan: Monitor for symptoms.  Monitor for signs of overload.  Will add low salt and CM restrictions. Dysphagia:  On DI diet due to pocketing and impulsivity.    Nyoka Cowden 04/29/2011, 8:28 AM

## 2011-04-29 NOTE — Progress Notes (Signed)
Podiatry Progress Note: S/  Patient seen resting in bed with family members visiting.  Eating dinner.  Appears much more awake and oriented then my last visit.  Denies pain to Left foot or Great toe.  Denies N/V/F/C. O/  Left great toe is stable but not significantly improved.  -  Inablility to palpate pedal pulses and decreased capillary refill time of digit continues to be present.  Slight boggy feeling to the medial aspect of the left great toe noted. No redness or swelling present, no calor or signs of infection present.  No malodor noted.  Distal aspect of hallux has fibrous base, no drainage seen. A/  S/p drainage abscess and of ingrown hallux nail left P/  Recommend repeat x-ray of left great toe and lower extremity arterial studies to assess potential for healing.  Will order.  Will follow up.   Trudie Buckler, DPM

## 2011-04-29 NOTE — Progress Notes (Signed)
In bed most of day .except for therapy. Full supervision with meals, Needs sit up and cueing during meals. Incontinent  Bowel and bladder. 2+ assist with sara. Vinegar soaks this am to both feet.Dressing change to lt great toe. Prafo to left foot while in bed.. Takes medication whole in apple sauce.  See fim for bathing and dressing

## 2011-04-29 NOTE — Progress Notes (Signed)
ANTICOAGULATION CONSULT NOTE - Follow Up Consult  Pharmacy Consult:  Coumadin Indication:  CVA  Allergies  Allergen Reactions  . Ace Inhibitors Shortness Of Breath  . Vancomycin Other (See Comments)    Unknown     Patient Measurements: Height: 5\' 7"  (170.2 cm) Weight: 275 lb 5.7 oz (124.9 kg) IBW/kg (Calculated) : 61.6    Vital Signs: Temp: 97.1 F (36.2 C) (11/18 0549) Temp src: Axillary (11/18 0549) BP: 125/75 mmHg (11/18 0549) Pulse Rate: 76  (11/18 0549)  Labs:  Basename 04/29/11 0831 04/28/11 0750 04/27/11 0700  HGB -- -- 8.8*  HCT -- -- 28.6*  PLT -- -- 466*  APTT -- -- --  LABPROT 23.9* 16.5* 17.5*  INR 2.10* 1.31 1.41  HEPARINUNFRC -- -- --  CREATININE -- -- 1.44*  CKTOTAL -- -- --  CKMB -- -- --  TROPONINI -- -- --   Estimated Creatinine Clearance: 65.5 ml/min (by C-G formula based on Cr of 1.44).    Assessment: 56 YOF s/p CVA on Coumadin.  INR increased to therapeutic level today likely d/t DDI with Diflucan.  Goal of Therapy: INR 2-3    Plan:  1.  Coumadin 5mg  PO today. 2.  Continue Lovenox today and if INR remains therapeutic tomorrow will d/c. 3.  F/U INR in AM   Wallace, Jceon Alverio Dien 04/29/2011,10:02 AM

## 2011-04-29 NOTE — Progress Notes (Signed)
Physical Therapy Session Note  Patient Details  Name: Mahailey Timbrook MRN: [id number] Date of Birth: [birth year]  Today's Date: 04/29/2011 Time: [id number] Time Calculation (min): 54 min  Precautions: Precautions Precautions: Fall Precaution Comments: fall, obesity Required Braces or Orthoses: No (Recommend PRAFO for left LE) Restrictions Weight Bearing Restrictions: No Other Position/Activity Restrictions: diplopia, homonimous hemianopsia, left inattention, left UE sensory loss, left hemiplegia, apraxia  Short Term Goals: PT Short Term Goal 1: Pt will roll left with max assist, roll right with mod assist. PT Short Term Goal 2: Pt will supine-sit with max assist. PT Short Term Goal 3: Pt will transfer with max assist. PT Short Term Goal 4: W/C mobility x 50 feet mod assist in controlled environment. PT Short Term Goal 5: Tolerate standing > 3 min with mechanical lift vs. total assist +2 to initiate weight bearing and pregait activities.  Skilled Therapeutic Interventions/Progress Updates: Focus of treatment: Standing using standing frame to facilitate weightbearing LLE and trunk extension; rolling         Therapy Vitals Temp: 98.1 F (36.7 C) Temp src: Oral Pulse Rate: 87  Resp: 18  BP: 112/81 mmHg Patient Position, if appropriate: Sitting Oxygen Therapy SpO2: 97 % O2 Device: None (Room air) Pain No complaint of pain  Mobility Rolling: (bed/mat) to left with tactile cues for positioning RLE and guarding LUE mod assist; to right mod/max  Transfers: Maxilift with +2 assist     Trunk/Postural Assessment Pt. Tolerated standing in standing frame approximately 10 minutes with assist to attain trunk extension; pt performed reaching activity to R; no complaint of dizziness        Exercises : Neuro reed Lt LE in sidelying (gravity eliminated) AA hip flexion/extension    Other Treatments    Therapy/Group: Individual Therapy  Myldred Raju,JIM 04/29/2011, 3:55 PM

## 2011-04-30 LAB — GLUCOSE, CAPILLARY
Glucose-Capillary: 75 mg/dL (ref 70–99)
Glucose-Capillary: 101 mg/dL — ABNORMAL HIGH (ref 70–99)

## 2011-04-30 LAB — PROTIME-INR: Prothrombin Time: 28.2 seconds — ABNORMAL HIGH (ref 11.6–15.2)

## 2011-04-30 MED ORDER — WARFARIN SODIUM 5 MG PO TABS
5.0000 mg | ORAL_TABLET | Freq: Once | ORAL | Status: AC
  Administered 2011-04-30: 5 mg via ORAL
  Filled 2011-04-30: qty 1

## 2011-04-30 NOTE — Progress Notes (Signed)
ANTICOAGULATION CONSULT NOTE - Follow Up Consult  Pharmacy Consult:  Coumadin Indication:  CVA  Allergies  Allergen Reactions  . Ace Inhibitors Shortness Of Breath  . Vancomycin Other (See Comments)    Unknown     Patient Measurements: Height: 5\' 7"  (170.2 cm) Weight: 275 lb 5.7 oz (124.9 kg) IBW/kg (Calculated) : 61.6    Vital Signs: Temp: 98.9 F (37.2 C) (11/19 0445) Temp src: Oral (11/19 0445) BP: 120/78 mmHg (11/19 0445) Pulse Rate: 83  (11/19 0445)  Labs:  Basename 04/30/11 0713 04/29/11 0831 04/28/11 0750  HGB -- -- --  HCT -- -- --  PLT -- -- --  APTT -- -- --  LABPROT 28.2* 23.9* 16.5*  INR 2.59* 2.10* 1.31  HEPARINUNFRC -- -- --  CREATININE -- -- --  CKTOTAL -- -- --  CKMB -- -- --  TROPONINI -- -- --   Estimated Creatinine Clearance: 65.5 ml/min (by C-G formula based on Cr of 1.44).    Assessment: 57 YOF s/p CVA on Coumadin.  INR remains therapeutic.  Goal of Therapy: INR 2-3   Plan:  1.  Coumadin 5mg  PO today. 2.  D/C Lovenox as INR remains therapeutic. 3.  F/U with order to decrease ASA dose or discontinue. 4.  INR in AM.   Mina Marble, Mayrani Khamis Dien 04/30/2011,11:01 AM

## 2011-04-30 NOTE — Progress Notes (Signed)
Max assist. Pt denies pain all day. Denies needs when asked on room checks. Pills whole with chocolate pudding. No BM today. Incontinent of urine. Good appetite today. Left great toe assessed applied Bactroban cream and dressing with clean gauze. Pt declined one of her therapies this AM due to tiredness. Continue plan of care.

## 2011-04-30 NOTE — Progress Notes (Signed)
Occupational  Therapy Note  Patient Details  Name: [name] MRN: [id number] Date of Birth: 06-Nov-1960 Today's Date: 04/30/2011  Time Calculation 1120-1200 40 min  Pain:  Patient denies pain  Skilled Clinical Intervention:  Patient seen for neuromuscular re-education to address incorporating left side of body into transitional movements such as rolling and supine to sit.  Patient continues with severe visual / perceptual deficits, and dense hemiplegia.  Patient with significant cognitive impairment, lacking awareness of stated deficits, asking if she can go to her sister's house for Thanksgiving.  Neuromuscular reeducation to left upper extremity; with facilitation, patient able to adduct and flex shoulder, extend elbow, pronate forearm, and had trace movement in finger flexion  Marlowe Sax M 04/30/2011, 2:05 PM

## 2011-04-30 NOTE — Progress Notes (Signed)
Occupational Therapy Session Note  Patient Details  Name: [name] MRN: [id number] Date of Birth: Apr 24, 1961  Today's Date: 04/30/2011 Time: 1000-1053 Time Calculation (min): 53 min  Precautions: Precautions Precautions: Fall Precaution Comments: fall, obesity Required Braces or Orthoses: No (Recommend PRAFO for left LE) Restrictions Weight Bearing Restrictions: No Other Position/Activity Restrictions: diplopia, homonimous hemianopsia, left inattention, left UE sensory loss, left hemiplegia, apraxia  Skilled Therapeutic Interventions/Progress Updates:    Engaged in ADL retraining at bed level.  Focus on bed mobility with rolling to Rt and Lt with total assist to Rt and max assist to Lt.  Pt completed bathing at bed level and was able to complete 5 of 10 steps with only setup assist.  Pt required assist to complete bathing and mod assist with donning dress.  Pt demonstrated improved Lt gaze and attention requiring minimal cues to scan to Lt.  Decreased LUE sensation and mod cues to orient self to LUE and make sure it stays supported and elevated.  Ther ex and tactile stimulation with pt donning lotion to LUE and encouraging tapping to increase sensation and bring awareness to Lt side. Pain Pain Assessment Pain Assessment: No/denies pain Pain Score: 0-No pain  Therapy/Group: Individual Therapy  Arne Cleveland. 04/30/2011, 11:57 AM

## 2011-04-30 NOTE — Progress Notes (Signed)
1st Shift RN to follow up US Order.

## 2011-04-30 NOTE — Progress Notes (Signed)
Subjective/Complaints: No new complaints, doesn't know how she feels yet.  Comfortable night.  Objective: Vital Signs: Blood pressure 120/78, pulse 83, temperature 98.9 F (37.2 C), temperature source Oral, resp. rate 22, height 5\' 7"  (1.702 m), weight 124.9 kg (275 lb 5.7 oz), SpO2 98.00%. Dg Foot Complete Left  04/30/2011  *RADIOLOGY REPORT*  Clinical Data: Diabetic foot ulcer.  LEFT FOOT - COMPLETE 3+ VIEW 04/29/2011:  Comparison: The left foot x-ray 04/22/2011.  Findings: Prior amputation of the second ray.  No evidence of acute or subacute fracture or dislocation.  Hallux valgus.  No radiographic evidence of osteomyelitis.  Calcifications involving the small arteries of the foot.  Large plantar calcaneal spur. Enthesopathy at the insertion of the Achilles tendon on the posterior calcaneus.  IMPRESSION: No acute or subacute osseous abnormality.  No evidence of osteomyelitis.  Hallux valgus.  Original Report Authenticated By: Deniece Portela, M.D.   No results found for this basename: WBC:2,HGB:2,HCT:2,PLT:2 in the last 72 hours CBG (last 3)   Basename 04/30/11 0714 04/29/11 2018 04/29/11 1618  GLUCAP 146* 74 88   Results for orders placed during the hospital encounter of 04/26/11 (from the past 24 hour(s))  GLUCOSE, CAPILLARY     Status: Normal   Collection Time   04/29/11  7:46 AM      Component Value Range   Glucose-Capillary 96  70 - 99 (mg/dL)   Comment 1 Notify RN    PROTIME-INR     Status: Abnormal   Collection Time   04/29/11  8:31 AM      Component Value Range   Prothrombin Time 23.9 (*) 11.6 - 15.2 (seconds)   INR 2.10 (*) 0.00 - 1.49   GLUCOSE, CAPILLARY     Status: Abnormal   Collection Time   04/29/11 11:13 AM      Component Value Range   Glucose-Capillary 131 (*) 70 - 99 (mg/dL)   Comment 1 Notify RN    GLUCOSE, CAPILLARY     Status: Normal   Collection Time   04/29/11  4:18 PM      Component Value Range   Glucose-Capillary 88  70 - 99 (mg/dL)   Comment 1 Notify RN    GLUCOSE, CAPILLARY     Status: Normal   Collection Time   04/29/11  8:18 PM      Component Value Range   Glucose-Capillary 74  70 - 99 (mg/dL)  GLUCOSE, CAPILLARY     Status: Abnormal   Collection Time   04/30/11  7:14 AM      Component Value Range   Glucose-Capillary 146 (*) 70 - 99 (mg/dL)    No results found for this basename: NA:2,K:2,CL:2,CO2:2,GLUCOSE:2,BUN:2,CREATININE:2,CALCIUM:2 in the last 72 hours BP Readings from Last 3 Encounters:  04/30/11 120/78  04/26/11 132/85  04/26/11 132/85   INR  Date Value Range Status  04/29/2011 2.10* 0.00-1.49 (no units) Final      Physical Exam: General appearance: morbidly obese, flat affect. Resp: clear to auscultation bilaterally Cardio: regular rate and rhythm GI: soft, non-tender; bowel sounds normal; no masses,  no organomegaly Extremities: no edema, redness or tenderness in the calves or thighs Pulses: 2+ and symmetric Skin: Skin color, texture, turgor normal. No rashes or lesions Neurologic: Gait: working on pregait activities. Alert and oriented X 3, flat affect, left inattention, left hemiparesis UE>LE.  Left hemisensory deficits.  Poor awareness . Assessment/Plan: 1. Functional deficits secondary to  Right Cardioembolic CVA  which require 3+ hours per  day of interdisciplinary therapy in a comprehensive inpatient rehab setting. Physiatrist is providing close team supervision and 24 hour management of active medical problems listed below. Physiatrist and rehab team continue to assess barriers to discharge/monitor patient progress toward functional and medical goals. 2.  Diabetes- stable 3.  Anemia- unchanged 4.  HTN- controlled Mobility: Bed Mobility Bed Mobility: Yes Rolling Right: 1: +1 Total assist Supine to Sit: 1: +2 Total assist;Patient percentage (comment);Other (comment) (pt = < 25 %) Transfers Transfers: Yes Sit to Stand: 1: +2 Total assist;Patient percentage (comment) (< 25%) Stand  to Sit: 1: +2 Total assist Lateral/Scoot Transfers: 1: +1 Total assist;With slide board;Other (comment) (chuck pad under pt to assist, transfer to pt's right side) Lateral/Scoot Transfer Details (indicate cue type and reason): decreased active left side initiation in weight bearing position, poor sustained activation, poor postural control, motor apraxia Ambulation/Gait Stairs: No (unsafe to attempt at this time, unable to achive standing) Wheelchair Mobility Wheelchair Mobility: No (Will be assessed during afternoon session) ADL:   Cognition: Cognition Overall Cognitive Status: Impaired Arousal/Alertness: Other (comment) (tired) Orientation Level: Oriented to person;Oriented to place;Disoriented to time Attention: Sustained Sustained Attention: Impaired Sustained Attention Impairment: Verbal basic;Functional basic Memory: Impaired Memory Impairment: Storage deficit;Retrieval deficit;Decreased recall of new information Awareness: Impaired Awareness Impairment: Intellectual impairment (of swalloing precautions and physical deficits) Problem Solving: Impaired Problem Solving Impairment: Functional basic;Verbal basic Behaviors: Perseveration;Other (comment) (flat affect) Cognition Arousal/Alertness: Other (comment) (tired) Orientation Level: Oriented to person;Oriented to place;Disoriented to time  2. Anticoagulation/DVT prophylaxis with Pharmaceutical: Coumdin .  Continue aspirin and Lovenox  till INR therapeutic.  3. Pain Management: Monitor for now for OA symptoms.  Patient Active Hospital Problem List: DIABETES MELLITUS, TYPE II (01/03/2007)   Assessment: blood sugars 12-150 range   Plan: Amaryl added for control. Monitor over next few days. HYPERLIPIDEMIA (01/03/2007)   Assessment:    Plan: continue zocor DEPRESSION (01/03/2007)   Assessment: flat and seems disinterested.   Plan: Started on celexa.  Ego support by team.  SW to follow up today. NEUROPATHY, IDIOPATHIC  PERIPHERAL NOS (01/07/2007)   Assessment: Pain BLE especially with increased activity per patient   Plan: Continue neurontin ANEMIA (07/07/2010)   Assessment: ?etiology.    Plan: Anemia panel with low iron stores.  Continue iron supplement.  Will monitor H and H for now.  Check stool guaiacs.   HYPERTENSION (07/07/2010)   Assessment:  systolic's resonalble.  Monitor for trends.   Plan: Continue losartan, metoprolol, furosemide Cardiomyopathy (04/25/2011)    POA: Unknown   Assessment: EF @ 20-25%.  Likely contributing to SOB with activity.   Plan: Monitor for symptoms.  Monitor for signs of overload.  Will add low salt and CM restrictions. Dysphagia:  On DI diet due to pocketing and impulsivity.    Alysia Penna E 04/30/2011, 7:19 AM

## 2011-04-30 NOTE — Progress Notes (Addendum)
Speech Language Pathology Progress Notes  Speech Language Pathology Progress Note  Cognitive-Linguistic Short-Term Goals  Patient will:  1. Demonstrate problem solving with moderate assist semantic cues with BADLs.  2. Demonstrate sustained attention to BADL for 5-8 minutes with moderate semantic cues.  3. Label 1 physical and 1 cognitive deficit with minimal assist semantic cues.  4. Verbally express needs/wants with minimal assist sematic cues to increase vocal intensity.  Swallow Study: Short-term Goals  SLP Swallowing Goals  5. Patient will consume recommended diet without observed clinical signs of aspiration with: Minimal assistance;Minimal cueing  6. Patient will recall and utilize recommended strategies during swallow to increase swallowing safety with: Minimal assistance;Minimal cueing  Skilled Therapeutic Interventions/Progress Updates: session focused on therapeutic self feeding with Dys.1 textures and thin liquids.  Patient required hand over hand assist to utilize left upper extremity and minimal assist semantic cues for portions control with sips.  Cough x1 with large sip of water via cup.  Patient requires supervision sematic cues to problem solve with use of spoon and able to self correct x1 at end of session.   Therapy Start Time/End Time: Time Calculation Start Time: 0815 Stop Time: 0915 Time Calculation (min): 60 min  Pain: Pain Assessment Pain Assessment: No/denies pain Pain Score: 0-No pain  FIM: Comprehension Comprehension Mode: Auditory Comprehension: 4-Understands basic 75 - 89% of the time/requires cueing 10 - 24% of the time Expression Expression Mode: Verbal Expression: 2-Expresses basic 25 - 49% of the time/requires cueing 50 - 75% of the time. Uses single words/gestures. Social Interaction Social Interaction: 3-Interacts appropriately 50 - 74% of the time - May be physically or verbally inappropriate. Problem Solving Problem Solving Mode: Not  assessed Problem Solving: 2-Solves basic 25 - 49% of the time - needs direction more than half the time to initiate, plan or complete simple activities Memory Memory Mode: Not assessed Memory: 2-Recognizes or recalls 25 - 49% of the time/requires cueing 51 - 75% of the time   Therapy/Group: Individual Therapy     [name] 04/30/2011 9:25 AM

## 2011-04-30 NOTE — Progress Notes (Signed)
Physical Therapy Session Note  Patient Details  Name: [name] MRN: [id number] Date of Birth: [birth year]  Today's Date: 04/30/2011 Time: [id number]   Precautions: Precautions Precautions: Fall Precaution Comments: fall, obesity Required Braces or Orthoses: No (Recommend PRAFO for left LE) Restrictions Weight Bearing Restrictions: No Other Position/Activity Restrictions: diplopia, homonimous hemianopsia, left inattention, left UE sensory loss, left hemiplegia, apraxia  Short Term Goals: PT Short Term Goal 1: Pt will roll left with max assist, roll right with mod assist. PT Short Term Goal 2: Pt will supine-sit with max assist. PT Short Term Goal 3: Pt will transfer with max assist. PT Short Term Goal 4: W/C mobility x 50 feet mod assist in controlled environment. PT Short Term Goal 5: Tolerate standing > 3 min with mechanical lift vs. total assist +2 to initiate weight bearing and pregait activities.  Skilled Therapeutic Interventions/Progress Updates: PT noted to be we tof urine and have poor sitting posture in w/c, pt unaware.  Transferred back to bed towards L with slide board +3 assist, pt =20%, max instructional cues. Performed rolling training to with change brief and bedding  with multiple repititions with mod instructional cues for technique(flex knee, reach with UE, flex head), total instructional cues and assist to manage LUE, with rail pt rolled L with min A and after 5, reached close S; rolled R with mod A ( A to manage arm and flex L knee)with rail.  Supine to sit towards L thru sidelying with Total A+2 pt=40% with instructional cues for technique and manual facilitation to trunk and to assist RLE to move LLE off bed Sit to supine with total A + 2 pt=50%, x2 , assist to lift BLE x 1 and once to raise LLE only, assist to lower trunk in straight pattern, instructional cues Pt assists in moving up in bed with RUE and RLE, +2 A     Pain - none, applied kinsesiotape to RUE  for edema control, unable to remove ring   Trunk/Postural Assessment and balance Worked on postural control seated EOB with cues to extend lower, upper trunk and neck static sitting. Worked on trying to activate LUE and weight bear thru LUE.  Static balance close S with LE support. Worked on dynamic balance reaching with RUE in all planes with max cues to attend to L perispace, body, and move eyes and head towards target and touch.Pt moves quickly increasing fall risk but close S except reaching to feet increased assis (min). approx 30 min   Exercises- worked on pt moving LUE with her RUE    Therapy/Group: Individual Therapy  Othelia Pulling 04/30/2011, 2:08 PM

## 2011-04-30 NOTE — Progress Notes (Signed)
Patient family member states that a MD stated that she didn't need the prafo boot anymore.. Patient refused the boot. Nurse is aware.Marland KitchenMarland Kitchen

## 2011-05-01 DIAGNOSIS — L98499 Non-pressure chronic ulcer of skin of other sites with unspecified severity: Secondary | ICD-10-CM

## 2011-05-01 DIAGNOSIS — L97509 Non-pressure chronic ulcer of other part of unspecified foot with unspecified severity: Secondary | ICD-10-CM

## 2011-05-01 DIAGNOSIS — I739 Peripheral vascular disease, unspecified: Secondary | ICD-10-CM

## 2011-05-01 LAB — GLUCOSE, CAPILLARY: Glucose-Capillary: 130 mg/dL — ABNORMAL HIGH (ref 70–99)

## 2011-05-01 LAB — PROTIME-INR: Prothrombin Time: 28.5 seconds — ABNORMAL HIGH (ref 11.6–15.2)

## 2011-05-01 MED ORDER — WARFARIN SODIUM 5 MG PO TABS
5.0000 mg | ORAL_TABLET | Freq: Once | ORAL | Status: AC
  Administered 2011-05-01: 5 mg via ORAL
  Filled 2011-05-01: qty 1

## 2011-05-01 NOTE — Progress Notes (Signed)
Physical Therapy Session Note  Patient Details  Name: [name] MRN: [id number] Date of Birth: 04/05/1961  Today's Date: 05/01/2011 Time: 1330-1430 Time Calculation (min): 60 min  Precautions: Precautions Precautions: Fall Precaution Comments: fall, obesity Required Braces or Orthoses: No Restrictions Weight Bearing Restrictions: No Other Position/Activity Restrictions: diplopia with patch wearing schedule, homonimous hemianopsia, left inattention, left hemiplegia, apraxia, decreased sustained attention  Short Term Goals: PT Short Term Goal 1: Pt will roll left with max assist, roll right with mod assist. PT Short Term Goal 2: Pt will supine-sit with max assist. PT Short Term Goal 3: Pt will transfer with max assist. PT Short Term Goal 4: W/C mobility x 50 feet mod assist in controlled environment. PT Short Term Goal 5: Tolerate standing > 3 min with mechanical lift vs. total assist +2 to initiate weight bearing and pregait activities.  Skilled Therapeutic Interventions/Progress Updates:     General Chart Reviewed: Yes Pain Pain Assessment Pain Assessment: No/denies pain Pain Score: 0-No pain Mobility Bed Mobility Rolling Right: 2: Max assist Rolling Right Details: Manual facilitation for placement;Tactile cues for sequencing;Verbal cues for technique Rolling Left: 3: Mod assist Rolling Left Details: Tactile cues for sequencing;Verbal cues for technique;Manual facilitation for placement Supine to Sit: 1: +1 Total assist Supine to Sit Details: Manual facilitation for weight shifting;Tactile cues for sequencing;Manual facilitation for placement Sit to Supine - Left: 1: +1 Total assist Sit to Supine - Left Details: Tactile cues for sequencing;Manual facilitation for placement;Manual facilitation for weight shifting;Verbal cues for technique Transfers Lateral/Scoot Transfers: With slide board;1: +2 Total assist Lateral/Scoot Transfer Details: Tactile cues for  sequencing;Manual facilitation for weight shifting;Manual facilitation for placement;Manual facilitation for weight bearing    Other Treatments Treatments Therapeutic Activity: Slideboard transfer to and from [facility] total assist + 2, pt = 25%, manual facilitation for initiation with left LE, postural control, and weight shift. Rolling left and right in bed to doff and don pants with total assist and manual facilitation per above. With left LE placed on bed with knee flexion, pt able to initiate bridge with good weight bearing through and contraction of left LE. Pt also able to initiate left ankle dorsiflexion and plantarflexion in supine for the first time today, however she is unable to feel it. Sustained attention to task improving. Continues to do best with single step instructions.  Therapy/Group: Individual Therapy  Kendrick Ranch 05/01/2011, 5:18 PM

## 2011-05-01 NOTE — Progress Notes (Signed)
Patient ID: [name], female   DOB: 12-28-1960, 50 y.o.   MRN: [id number]     Subjective/Complaints: No new complaints, doesn't know how she feels yet.  Comfortable night.  Objective: Vital Signs: Blood pressure 126/85, pulse 77, temperature 98 F (36.7 C), temperature source Oral, resp. rate 19, height 5\' 7"  (1.702 m), weight 124.9 kg (275 lb 5.7 oz), SpO2 100.00%. Dg Foot Complete Left  04/30/2011  *RADIOLOGY REPORT*  Clinical Data: Diabetic foot ulcer.  LEFT FOOT - COMPLETE 3+ VIEW 04/29/2011:  Comparison: The left foot x-ray 04/22/2011.  Findings: Prior amputation of the second ray.  No evidence of acute or subacute fracture or dislocation.  Hallux valgus.  No radiographic evidence of osteomyelitis.  Calcifications involving the small arteries of the foot.  Large plantar calcaneal spur. Enthesopathy at the insertion of the Achilles tendon on the posterior calcaneus.  IMPRESSION: No acute or subacute osseous abnormality.  No evidence of osteomyelitis.  Hallux valgus.  Original Report Authenticated By: Deniece Portela, M.D.   No results found for this basename: WBC:2,HGB:2,HCT:2,PLT:2 in the last 72 hours CBG (last 3)   Basename 04/30/11 2105 04/30/11 1654 04/30/11 1110  GLUCAP 101* 75 137*   Results for orders placed during the hospital encounter of 04/26/11 (from the past 24 hour(s))  GLUCOSE, CAPILLARY     Status: Abnormal   Collection Time   04/30/11 11:10 AM      Component Value Range   Glucose-Capillary 137 (*) 70 - 99 (mg/dL)   Comment 1 Notify RN    GLUCOSE, CAPILLARY     Status: Normal   Collection Time   04/30/11  4:54 PM      Component Value Range   Glucose-Capillary 75  70 - 99 (mg/dL)   Comment 1 Notify RN    GLUCOSE, CAPILLARY     Status: Abnormal   Collection Time   04/30/11  9:05 PM      Component Value Range   Glucose-Capillary 101 (*) 70 - 99 (mg/dL)   Comment 1 Notify RN      No results found for this basename:  NA:2,K:2,CL:2,CO2:2,GLUCOSE:2,BUN:2,CREATININE:2,CALCIUM:2 in the last 72 hours BP Readings from Last 3 Encounters:  05/01/11 126/85  04/26/11 132/85  04/26/11 132/85   INR  Date Value Range Status  04/30/2011 2.59* 0.00-1.49 (no units) Final      Physical Exam: General appearance: morbidly obese, flat affect. Resp: clear to auscultation bilaterally Cardio: regular rate and rhythm GI: soft, non-tender; bowel sounds normal; no masses,  no organomegaly Extremities: no edema, redness or tenderness in the calves or thighs Pulses: 2+ and symmetric Skin: Skin color, texture, turgor normal. No rashes or lesions Neurologic: Gait: working on pregait activities. Alert and oriented X 3, flat affect, left inattention, left hemiparesis UE>LE.  Left hemisensory deficits.  Poor awareness . Assessment/Plan: 1. Functional deficits secondary to  Right Cardioembolic CVA  which require 3+ hours per day of interdisciplinary therapy in a comprehensive inpatient rehab setting. Physiatrist is providing close team supervision and 24 hour management of active medical problems listed below. Physiatrist and rehab team continue to assess barriers to discharge/monitor patient progress toward functional and medical goals. 2.  Diabetes- stable 3.  Anemia- unchanged 4.  HTN- controlled Mobility: Bed Mobility Bed Mobility: Yes Rolling Right: 1: +1 Total assist Rolling Left: 1: +1 Total assist Rolling Left Details (indicate cue type and reason): Assist to flex left leg, and cues to maintain flexion in right leg.  Utilized bed rail with tactile  cue to locate Supine to Sit: 1: +1 Total assist Transfers Transfers: Yes Sit to Stand: 1: +2 Total assist Stand to Sit: 1: +2 Total assist Lateral/Scoot Transfers: With slide board;1: +2 Total assist Lateral/Scoot Transfer Details (indicate cue type and reason): Second person needed to stabilize equipment during transfer,  Slide board transfer to  right Ambulation/Gait Ambulation/Gait Assistance: Not tested (comment) Stairs: No (unsafe to attempt at this time, unable to achive standing) Wheelchair Mobility Wheelchair Mobility: Yes Distance:  (53ft) ADL:   Cognition: Cognition Overall Cognitive Status: Impaired Arousal/Alertness: Other (comment) (tired) Orientation Level: Oriented to person;Oriented to situation (poor awareness of body and deficits) Attention: Sustained Sustained Attention: Impaired Sustained Attention Impairment: Verbal basic;Functional basic Memory: Impaired Memory Impairment: Storage deficit;Retrieval deficit;Decreased recall of new information Awareness: Impaired Awareness Impairment: Intellectual impairment (of swalloing precautions and physical deficits) Problem Solving: Impaired Problem Solving Impairment: Functional basic;Verbal basic Behaviors: Perseveration;Other (comment) (flat affect) Cognition Arousal/Alertness: Other (comment) (tired) Orientation Level: Oriented to person;Oriented to situation (poor awareness of body and deficits)  2. Anticoagulation/DVT prophylaxis with Pharmaceutical: Coumdin .  Continue aspirin and Lovenox  till INR therapeutic.  3. Pain Management: Monitor for now for OA symptoms.  Patient Active Hospital Problem List: DIABETES MELLITUS, TYPE II (01/03/2007)   Assessment: blood sugars 12-150 range   Plan: Amaryl added for control. Monitor over next few days. HYPERLIPIDEMIA (01/03/2007)   Assessment:    Plan: continue zocor DEPRESSION (01/03/2007)   Assessment: flat and seems disinterested.   Plan: Started on celexa.  Ego support by team.  SW to follow up today. NEUROPATHY, IDIOPATHIC PERIPHERAL NOS (01/07/2007)   Assessment: Pain BLE especially with increased activity per patient   Plan: Continue neurontin ANEMIA (07/07/2010)   Assessment: ?etiology.    Plan: Anemia panel with low iron stores.  Continue iron supplement.  Will monitor H and H for now.  Check stool  guaiacs.   HYPERTENSION (07/07/2010)   Assessment:  systolic's resonalble.  Monitor for trends.   Plan: Continue losartan, metoprolol, furosemide Cardiomyopathy (04/25/2011)    POA: Unknown   Assessment: EF @ 20-25%.  Likely contributing to SOB with activity.   Plan: Monitor for symptoms.  Monitor for signs of overload.  Will add low salt and CM restrictions. Dysphagia:  On DI diet due to pocketing and impulsivity. D/C ASA   KIRSTEINS,ANDREW E 05/01/2011, 7:14 AM

## 2011-05-01 NOTE — Progress Notes (Signed)
Pt to vascular lab this AM for doppler scans. Cleaned left great toe with soap/water, dried, applied ointment and wrapped with dry gauze. Pt followed simple commands during assessment.  Pt family harsh with CNA during morning rounds. Very demanding over breakfast tray. Stated tray has been there for 15 minutes and no one has come in to assist with her feeding yet. I personally witnessed trays were just delivered to rooms and speech was in room very shortly after incident and performed eval with breakfast. CNAs just left room prior to incident doing AM introductions. Family at bedside through out the day. Observed for further incidences. None following the AM occurrence. Pt rested well. Appeared more alert today. Pt stated she slept better during night. Pt very talkative with family at bedside and staff. Noted positive change with her today. No incontinent episodes today. No BM on day shift. No c/o pain. Participating with therapies. Continue plan of care.

## 2011-05-01 NOTE — Progress Notes (Signed)
Patient Details  Name: [name] MRN: [id number] Date of Birth: 04/05/1961  Today's Date: 05/01/2011 Time: 8:56   TR order received and chart reviewed.  Pt on hold for TR services at this time due to decreased activity tolerance.  Will attempt eval completion early next week as appropriate.  Will continue to monitor.   Varick Keys 05/01/2011, 8:56 AM

## 2011-05-01 NOTE — Progress Notes (Signed)
Occupational Therapy Note  Patient Details  Name: [name] MRN: [id number] Date of Birth: 06-May-1961 Today's Date: 05/01/2011  1520-1655 35 min   Skilled Intervention: Neuro rehab with focus on occulomotor exercises to increase saccadic eye movements to left and increase visual attention. Pt able to hold gaze in left field x 10 seconds. Also worked on convergence exercises to decrease diplopia. Used block on R temporal field with increase eye movement to nasal field. Will assess use of block of left nasal field to increase eye movement to left. Used anchors in left field to increase left scanning. At sink, pt requires +2 skilled assist using 3 musketeer technique for sit to stand. Pt completed @ 80% of movement with left knee blocked. Required mod vc and tactile cues for upright posture. Pt able to verbalize need to stay in "center". No functional movement observed LUE. Flaccid. Poor awareness LUE.  Individual session    [place] 05/01/2011, 5:05 PM

## 2011-05-01 NOTE — Progress Notes (Signed)
*  PRELIMINARY RESULTS*  Lower Extremity Arterial Imaging and ABIs have been performed. ABIs not ascertained due to non-compressible vessels bilaterally.   Landry Mellow 05/01/2011, 11:05 AM

## 2011-05-01 NOTE — Progress Notes (Signed)
PT = 28.5, INR 2.63  A: CVA. INR now in therapeutic range 2-3. ASA d/c'd.  P: Repeat Coumadin 5mg  today.

## 2011-05-01 NOTE — Progress Notes (Signed)
Occupational Therapy Note  Patient Details  Name: [name] MRN: [id number] Date of Birth: [birth year] Today's Date: 05/01/2011  Pt missed 60 mins skilled OT session secondary to being off the unit for ?dopplar/other scan.  Will follow up as able.   [name] 05/01/2011, 10:54 AM

## 2011-05-01 NOTE — Progress Notes (Signed)
Speech Language Pathology Progress Notes  Speech Language Pathology Progress Note    Therapy Start Time/End Time: Time Calculation Start Time: [id number] Stop Time: 1220 Time Calculation (min): 50 min   Pain: Pain Assessment Pain Assessment: No/denies pain Pain Score: 0-No pain  Therapy/Group: Individual Therapy  Skilled intervention:  Pt resting in bed; sisters present.  Improved attention to left; max assist initially to locate left arm and call bell; by end of session, reaching independently for iced tea on far left side of bed.  Persisting impairments in intellectual awareness - mod cues to identify deficits post stroke.  Pt successfully sustained attention to verbal sequencing/organization task with emerging elements of alternating attention present (able to independently return to sequencing task and recall appropriate step after interruption from staff).  Mod assist for problem-solving during set-up for lunch and with self-feeding.  Impulsive liquid intake followed by coughing - pt with awareness she needed to follow precautions; demonstrated carry-over with improved inhibition after coughing incident.  Overall with adequate diet toleration with exception of described incident.  Pt will benefit from trial of upgraded diet to determine readiness for advancement.     [name] 05/01/2011 12:21 PM

## 2011-05-02 DIAGNOSIS — I70269 Atherosclerosis of native arteries of extremities with gangrene, unspecified extremity: Secondary | ICD-10-CM

## 2011-05-02 DIAGNOSIS — I6789 Other cerebrovascular disease: Secondary | ICD-10-CM

## 2011-05-02 LAB — GLUCOSE, CAPILLARY: Glucose-Capillary: 92 mg/dL (ref 70–99)

## 2011-05-02 MED ORDER — GABAPENTIN 300 MG PO CAPS
600.0000 mg | ORAL_CAPSULE | Freq: Four times a day (QID) | ORAL | Status: DC
  Administered 2011-05-02 – 2011-05-16 (×56): 600 mg via ORAL
  Filled 2011-05-02 (×61): qty 2

## 2011-05-02 MED ORDER — WARFARIN SODIUM 5 MG PO TABS
5.0000 mg | ORAL_TABLET | Freq: Once | ORAL | Status: AC
  Administered 2011-05-02: 5 mg via ORAL
  Filled 2011-05-02: qty 1

## 2011-05-02 NOTE — Progress Notes (Signed)
Occupational Therapy Session Note  Patient Details  Name: [name] MRN: [id number] Date of Birth: February 04, 1961  Today's Date: 05/02/2011 Time: [id number] Time Calculation (min): 30 min  Precautions: Precautions Precautions: Fall Precaution Comments: fall, obesity Required Braces or Orthoses:  (Left PRAFO in bed) Restrictions Weight Bearing Restrictions: No Other Position/Activity Restrictions: diplopia with patch wearing schedule, homonimous hemianopsia, left inattention, left hemiplegia, apraxia, absent left sided sensation, decreased sustained attention  Short Term Goals: OT Short Term Goal 1: Pt will complete bathing with mod assist OT Short Term Goal 2: Pt will complete UB dressing with mod assist OT Short Term Goal 3: Pt will complete toilet transfer with max assist using squat pivot technique OT Short Term Goal 4: Pt will complete grooming in midline seated position at sink with min cues to scan to obtain items  Skilled Therapeutic Interventions/Progress Updates:    Engaged in visual motor exercises of tracking, focal precision, and scanning.  Pt required mod verbal cues for attention to task and cues to use RUE to follow along with tracing and identifying letters and symbols.  Engaged in tracing activity with each eye covered one at a time, line bisection, and clock drawing.  Pt with increased deficits with Rt eye covered with deficits in sequencing, problem solving, and focused/visual attention to task.  Pain Pain Assessment Pain Assessment: No/denies pain Pain Score: 0-No pain  Therapy/Group: Individual Therapy  [name] 05/02/2011, 3:57 PM

## 2011-05-02 NOTE — Progress Notes (Signed)
Speech Language Pathology   Therapy time in/time out:  9:05-10:05; 60 minutes Individual treatment Pain:  No pain    Therapeutic Intervention:  Pt sitting in chair at bedside.  Trial mechanical solids - mod assist to check for left pocketing and left spillage, but thorough/safe mastication.  Adequate toleration thin liquids. Recommend advancing to Dysphagia 2 diet with thin liquids and continued full supervision secondary to cognitive deficits.   Pt continues with improved identification of impairments post-stroke; needed verbal cues to continue to next item in list. Sustained attention to problem-solving task independently.  Visual barriers with difficulty identifying pictured items; however, completed verbal problem-solving task with min assist to identify pictured problems, provide rationale for safety risks, and provide one solution to each problem.  Progressing toward goals.

## 2011-05-02 NOTE — Progress Notes (Signed)
ANTICOAGULATION CONSULT NOTE - Follow Up Consult  Pharmacy Consult:  Murphy Indication:  CVA  Allergies  Allergen Reactions  . Ace Inhibitors Shortness Of Breath  . Vancomycin Other (See Comments)    Unknown     Patient Measurements: Height: 5\' 7"  (170.2 cm) Weight: 275 lb 2.2 oz (124.8 kg) IBW/kg (Calculated) : 61.6    Vital Signs: Temp: 97.6 F (36.4 C) (11/21 0500) Temp src: Oral (11/21 0500) BP: 119/84 mmHg (11/21 0500) Pulse Rate: 69  (11/21 0500)  Labs:  Basename 05/02/11 0700 05/01/11 0620 04/30/11 0713  HGB -- -- --  HCT -- -- --  PLT -- -- --  APTT -- -- --  LABPROT 30.1* 28.5* 28.2*  INR 2.82* 2.63* 2.59*  HEPARINUNFRC -- -- --  CREATININE -- -- --  CKTOTAL -- -- --  CKMB -- -- --  TROPONINI -- -- --   Estimated Creatinine Clearance: 65.5 ml/min (by C-G formula based on Cr of 1.44).    Assessment: [name].  INR remains therapeutic.  Goal of Therapy: INR 2-3   Plan:  1.  Murphy 5mg  PO today. 2.  INR daily.   Makaylin Carlo K. Posey Pronto, PharmD, BCPS.  Clinical Pharmacist Pager [phone]. 05/02/2011 3:02 PM

## 2011-05-02 NOTE — Progress Notes (Signed)
Podiatry Progress Note:  S/  Patient seen in room resting comfortably.  Relates no pain in Left Great toe.  States nursing has been changing the dressing.  Patient had vascular study performed yesterday.  O/  Appearance of Left Hallux is improved -  Bone fragment no longer palpable.  No discreet abscess noted, area on medial aspect of hallux looks much better and appears to be healing.  No redness, no swelling, no abscess noted, no malodor seen.  A/  Left Halllux abscess- improving with bactroban and dressing changes.  P/  Continue dressing changes,  Vascular has been consulted regarding healing potential of toe.  Will sign off and if any concern arises, please contact me.  Trudie Buckler, DPM

## 2011-05-02 NOTE — Consult Note (Signed)
[facility] Consult  Reson for Consult:  Great toe wound left. Referring Physician:  Charlett Blake  History of Present Illness: This is a 50 y.o. female who has a hx of HTN, HLD, and a prior hx of CVA in 12/11, which left her with residual aphasia and left hemiplegia.  She presented to the ED on 04/22/11 for slurred speech.  When the pt was getting into the car, she developed slurred speech, slight left facial droop.  A head CT was done in the ED and was negative.  She is now in IP rehabilitation on the [facility].    She also has a left great toe wound.  Pt states that before hospitalization, she went to have a pedicure and she developed a toe wound after that.  She guesses she has had this wound for 2-4 weeks.  She states that she thinks it is from an ingrown toenail.  Vascular studies have been done and results are below.  Vascular surgery is consulted.  Pt states that she had a 2nd toe amputation on the left in the past.  She has DM and states that the podiatrist cuts her toenails.   Past Medical History  Diagnosis Date  . HTN (hypertension)   . HLD (hyperlipidemia)   . History of methicillin resistant staphylococcus aureus (MRSA)   . Sleep apnea   . CVA (cerebral infarction)   . Anemia   . Tachycardia   . Obesity   . Renal failure     due to vanc toxicity  . Osteomyelitis   . Lower back pain     Chronic  . Idiopathic peripheral neuropathy     NOS  . DJD (degenerative joint disease)   . Headache   . GERD (gastroesophageal reflux disease)   . DM type 2 (diabetes mellitus, type 2)   . Depression   . Anxiety   . Hypokalemia     resolved  . Cerebral infarct     left parietal and bilateral  . Esophagitis     distal  . Fluid overload     compensated  . Urinary retention   . H/O: pneumonia   . History of bacteremia   . Surgery, other elective     of the ear  . Stroke     Hx of left frontoparietal stroke in 12/11. History of right brain stroke.    Past Surgical History  Procedure Date  . Tympanoplasty   . Toe amputation     left 2nd toe  . Dilation and curettage of uterus     history    Allergies  Allergen Reactions  . Ace Inhibitors Shortness Of Breath  . Vancomycin Other (See Comments)    Unknown     Prior to Admission medications   Medication Sig Start Date End Date Taking? Authorizing Provider  amitriptyline (ELAVIL) 50 MG tablet Take 50 mg by mouth at bedtime. For nerves/rest     Historical Provider, MD  cycloSPORINE (RESTASIS) 0.05 % ophthalmic emulsion Place 1 drop into both eyes 2 (two) times daily.      Historical Provider, MD  ferrous sulfate 325 (65 FE) MG tablet Take 325 mg by mouth 2 (two) times daily.      Historical Provider, MD  gabapentin (NEURONTIN) 600 MG tablet Take 600 mg by mouth. 4 times per day  09/11/10   Josue Hector, MD  metFORMIN (GLUCOPHAGE) 1000 MG tablet Take 1,000 mg by mouth 2 (two) times daily.  Historical Provider, MD  metoprolol (LOPRESSOR) 50 MG tablet Take 50 mg by mouth 2 (two) times daily.     Historical Provider, MD  pravastatin (PRAVACHOL) 40 MG tablet Take 40 mg by mouth at bedtime.      Historical Provider, MD   Current Facility-Administered Medications  Medication Dose Route Frequency Provider Last Rate Last Dose  . acetaminophen (TYLENOL) tablet 325-650 mg  325-650 mg Oral Q4H PRN Bary Leriche, PA   650 mg at 04/28/11 1348  . alum & mag hydroxide-simeth (MAALOX PLUS) 400-400-40 MG/5ML suspension 30 mL  30 mL Oral Q4H PRN Bary Leriche, PA      . amitriptyline (ELAVIL) tablet 50 mg  50 mg Oral QHS Ivan Anchors Love, PA   50 mg at 05/01/11 2154  . antiseptic oral rinse (BIOTENE) solution 15 mL  15 mL Mouth Rinse BID Ivan Anchors Love, PA   15 mL at 05/02/11 0903  . bisacodyl (DULCOLAX) suppository 10 mg  10 mg Rectal Daily PRN Bary Leriche, PA      . citalopram (CELEXA) tablet 10 mg  10 mg Oral QHS Ivan Anchors Love, PA   10 mg at 05/01/11 2153  . cycloSPORINE (RESTASIS) 0.05 %  ophthalmic emulsion 1 drop  1 drop Both Eyes BID Bary Leriche, PA      . diphenhydrAMINE (BENADRYL) 12.5 MG/5ML elixir 12.5-25 mg  12.5-25 mg Oral Q6H PRN Bary Leriche, PA      . ferrous sulfate tablet 325 mg  325 mg Oral BID Bary Leriche, PA   325 mg at 05/02/11 0901  . furosemide (LASIX) tablet 20 mg  20 mg Oral Daily Ivan Anchors Love, PA   20 mg at 05/02/11 0901  . gabapentin (NEURONTIN) capsule 600 mg  600 mg Oral QID Charlett Blake, MD   600 mg at 05/02/11 1249  . glimepiride (AMARYL) tablet 2 mg  2 mg Oral Q breakfast Bary Leriche, PA   2 mg at 05/02/11 0901  . guaiFENesin-dextromethorphan (ROBITUSSIN DM) 100-10 MG/5ML syrup 5-10 mL  5-10 mL Oral Q6H PRN Bary Leriche, PA      . insulin aspart (novoLOG) injection 0-15 Units  0-15 Units Subcutaneous TID WC & HS Bary Leriche, PA   2 Units at 05/01/11 1201  . losartan (COZAAR) tablet 100 mg  100 mg Oral Daily Ivan Anchors Love, PA   100 mg at 05/02/11 [id number]  . methocarbamol (ROBAXIN) tablet 500 mg  500 mg Oral Q6H PRN Bary Leriche, PA   500 mg at 04/30/11 0925  . metoprolol tartrate (LOPRESSOR) tablet 75 mg  75 mg Oral BID Bary Leriche, PA   75 mg at 05/02/11 0902  . mupirocin ointment (BACTROBAN) 2 % 1 application  1 application Topical BID Viona Gilmore Egerton   1 application at [id number] 2000  . promethazine (PHENERGAN) tablet 12.5 mg  12.5 mg Oral Q6H PRN Bary Leriche, PA       Or  . promethazine (PHENERGAN) suppository 12.5 mg  12.5 mg Rectal Q6H PRN Bary Leriche, PA       Or  . promethazine (PHENERGAN) injection 12.5 mg  12.5 mg Intramuscular Q6H PRN Bary Leriche, PA      . simvastatin (ZOCOR) tablet 20 mg  20 mg Oral q1800 Ivan Anchors Love, PA   20 mg at 05/01/11 1735  . sorbitol 70 % solution 30 mL  30 mL Oral Q12H PRN Ivan Anchors  Love, PA      . warfarin (COUMADIN) tablet 5 mg  5 mg Oral ONCE-1800 Crystal Ivey, PHARMD   5 mg at 05/01/11 1735  . DISCONTD: gabapentin (NEURONTIN) capsule 600 mg  600 mg Oral TID Bary Leriche,  PA   600 mg at 05/01/11 1949     History   Social History  . Marital Status: Divorced    Spouse Name: N/A    Number of Children: N/A  . Years of Education: N/A   Occupational History  . Not on file.   Social History Main Topics  . Smoking status: Never Smoker   . Smokeless tobacco: Never Used  . Alcohol Use: Yes     rare on holidays  . Drug Use: No  . Sexually Active: Not Currently   Other Topics Concern  . Not on file   Social History Narrative   DivorcedOne of 10 children5 in [place] and was living with sister at time of CVANonsmokerNondrinker    Family History  Problem Relation Age of Onset  . Coronary artery disease    . Diabetes       ROS: [x]  Positive   [ ]  Negative   [ ]  All sytems reviewed and are negative  General: [ ]  Weight loss, [ ]  Fever, [ ]  chills Neurologic: [ ]  Dizziness, [ ]  Blackouts, [ ]  Seizure [x]  Stroke, [x]  "Mini stroke", [x]  Slurred speech, [ ]  Temporary blindness; [x]  weakness in arms or legs, [ ]  Hoarseness; [x]  wears an eye patch; states that she had laser surgery for retinopathy; she rotates the patch b/w eyes to strengthen her eye muscle use. Cardiac: [ ]  Chest pain/pressure, [ ]  Shortness of breath at rest [ ]  Shortness of breath with exertion, [ ]  Atrial fibrillation or irregular heartbeat Vascular: [ ]  Pain in legs with walking, [ ]  Pain in legs at rest, [ ]  Pain in legs at night,  [x]  Non-healing ulcer, [ ]  Blood clot in vein/DVT,  [x]  pain in calves--improves with pain medication.  Does not ambulate due to hemiparesis Pulmonary: [ ]  Home oxygen, [ ]  Productive cough, [ ]  Coughing up blood, [ ]  Asthma,  [ ]  Wheezing Musculoskeletal:  [x]  Arthritis, [ ]  Low back pain, [ ]  Joint pain Hematologic: [ ]  Easy Bruising, [x]  Anemia; [ ]  Hepatitis Gastrointestinal: [ ]  Blood in stool, [ ]  Gastroesophageal Reflux/heartburn, [ ]  Trouble swallowing;  Urinary: [ ]  chronic Kidney disease, [ ]  on HD - [ ]  MWF or [ ]  TTHS, [ ]  Burning with urination, [ ]   Difficulty urinating Skin: [ ]  Rashes, [ ]  Wounds Psychological: [ ]  Anxiety, [x]  Depression   Physical Examination  Filed Vitals:   05/02/11 0500  BP: 119/84  Pulse: 69  Temp: 97.6 F (36.4 C)  Resp: 19    Body mass index is 43.09 kg/(m^2).  General:  WDWN morbidly obese female in NAD Gait: Normal HENT:  Wears eye patch and rotates this on a regular basis, NCAT, OP w/o Erythema or exudate Pulmonary: normal non-labored breathing , without Rales, rhonchi,  wheezing Cardiac: RRR, without  Murmurs, rubs or gallops; No carotid bruits Abdomen: soft, NT, no masses, +BS Skin: no rashes, ulcers noted with exception of left big toe--portion of the toe nail is gone.  There is an ulceration on the left big toe.   Vascular Exam/Pulses: +doppler signal bilateral DP; there was a good PT doppler signal on the right; the left PT signal was weak. Extremities:  open wound on left great toe;  Musculoskeletal: no muscle wasting or atrophy  Neurologic: A&O X 3; Appropriate Affect;  She has no use of her LUE; 0/5 grip on the left and 5/5 on the right.  She does have movement of her LLE, but it is limited; her RLE has good movement. Speech is fluent but slow. Psych: Judgement intact, mood and affect appropriate Lymph: no obvious LAD  Non-Invasive Vascular Imaging:  Brachial pressures:  +--------+-----+----+---+  RightLeftMax +--------+-----+----+---+ Systolic160 [id number] 0000000 +--------+-----+----+---+ Arterial pressure indices:  +----------------+--------+-------------+------------------+ Location PressureBrachial Waveform    index   +----------------+--------+-------------+------------------+ Right ant tibial368mm Hg-------------Biphasic  +----------------+--------+-------------+------------------+ Right post 25mm Hg 0.28 Severely dampened  tibial   monophasic  +----------------+--------+-------------+------------------+ Left ant tibial 353mm  Hg-------------Monophasic  +----------------+--------+-------------+------------------+ Left post tibial84mm Hg 0.33 Severely dampened     monophasic  +----------------+--------+-------------+------------------+ Arterial flow:  +---------------+--------+---------------+-----------------+ Location V sys Flow analysis Comment  +---------------+--------+---------------+-----------------+ Right common 81cm/s Triphasic Minimal wall  femoral -  waveform changes  distal     +---------------+--------+---------------+-----------------+ Right profunda 56cm/s Triphasic Minimal wall  femoral -  waveform changes  proximal     +---------------+--------+---------------+-----------------+ Right femoral -68cm/s Triphasic Minimal wall  proximal  waveform changes  +---------------+--------+---------------+-----------------+ Right femoral --67cm/s Triphasic Minimal wall  mid  waveform changes  +---------------+--------+---------------+-----------------+ Right femoral --46cm/s Triphasic Minimal wall  distal  waveform changes  +---------------+--------+---------------+-----------------+ Right popliteal81cm/s Triphasic Minimal wall  - proximal  waveform changes  +---------------+--------+---------------+-----------------+ Right popliteal71cm/s Triphasic Minimal wall  - distal  waveform changes  +---------------+--------+---------------+-----------------+ Right anterior -146cm/sBiphasic Mild plaque noted tibial - distal waveform   +---------------+--------+---------------+-----------------+ Right posterior-15cm/s Monophasic Dampened  tibial - distal waveform monophasic.     Difficult to     image  +---------------+--------+---------------+-----------------+ Left common 48cm/s Triphasic Minimal wall  femoral -  waveform changes  distal      +---------------+--------+---------------+-----------------+ Left profunda 57cm/s Triphasic Minimal wall  femoral -  waveform changes  proximal     +---------------+--------+---------------+-----------------+ Left femoral - 56cm/s Triphasic Minimal wall  proximal  waveform changes  +---------------+--------+---------------+-----------------+ Left femoral - -51cm/s Triphasic Minimal wall  mid  waveform changes  +---------------+--------+---------------+-----------------+ Left femoral - -40cm/s Triphasic Minimal wall  distal  waveform changes  +---------------+--------+---------------+-----------------+ Left popliteal 81cm/s Triphasic Minimal wall  - proximal  waveform changes  +---------------+--------+---------------+-----------------+ Left popliteal -56cm/s Triphasic Minimal wall  - distal  waveform changes  +---------------+--------+---------------+-----------------+ Left anterior 36cm/s Biphasic Mild plaque  tibial - distal waveform noted.  +---------------+--------+---------------+-----------------+ Left posterior --------Monophasic Dampened  tibial - distal waveform monophasic.     Difficult to     image.  +---------------+--------+---------------+-----------------+  ------------------------------------------------------------ Summary:  - Bilateral ABIs in the anterior tibial arteries could not be ascertained secondary to incompressible vessels. The posterior tibialABIs indicated a severe reduction in arterial flow bilaterally. - Duplex scan revealed minimal wall changes throughout bilaterally with no evidence of significant stenosis. There is narrowing of the tibial vessels with mild plaque noted bilaterally. - Technically difficult due to morbid obesity. Other specific details can be found in the table(s) above. Prepared and Electronically Authenticated by Ruta Hinds, MD 2012-11-20T14:07:07.967   ASSESSMENT/PLAN: This is a 50 y.o. morbidly obese female who presented with CVA.  She does have a left great toe wound, which vascular surgery was consulted for.  She does have good signal with the doppler in her bilateral DP pulses.  Dr. Bridgett Larsson will evaluate the pt and look at her ABI study to evaluate waveforms.  Pt is on Coumadin.   Evorn Gong, PA-C [facility]  [phone]  Addendum  I have independently interviewed and examined the patient, and I agree with the physician assistant's findings.  The left great toe has dry gangrene.  I have not aggressively explored the left great toe, but I suspect there is bone partially exposed.  I reviewed the patient's ABIs.  She has dampened monophasic signals in both PTs.  She has a biphasic DP on the right and a biphasic hyperemic DP on the left.  This is obvious evidence of peripheral arterial disease but she does have evidence of perfusion into both feet via the AT/DP system.  I would obtain toe pressures and DBI to see if she would heal a toe amputation.  Given her dense hemiparesis on the left, I see NO advantage to proceeding with a surgical bypass given the 2% mortality rate cite in national series.  I suspect the patient and the family will need an objective study such as an angiogram prior to proceeding with any type of amputation, so I will plan such either this Friday or Monday, pending available of the PV lab.  CBC    Component Value Date/Time   WBC 7.6 04/27/2011 0700   RBC 3.31* 04/27/2011 0700   HGB 8.8* 04/27/2011 0700   HCT 28.6* 04/27/2011 0700   PLT 466* 04/27/2011 0700   MCV 86.4 04/27/2011 0700   MCH 26.6 04/27/2011 0700   MCHC 30.8 04/27/2011 0700   RDW 16.2* 04/27/2011 0700   LYMPHSABS 2.0 04/27/2011 0700   MONOABS 1.0 04/27/2011 0700   EOSABS 0.2 04/27/2011 0700   BASOSABS 0.0 04/27/2011 0700   BMET    Component Value Date/Time   NA 137 04/27/2011 0700    K 4.6 04/27/2011 0700   CL 108 04/27/2011 0700   CO2 22 04/27/2011 0700   GLUCOSE 146* 04/27/2011 0700   BUN 34* 04/27/2011 0700   CREATININE 1.44* 04/27/2011 0700   CALCIUM 8.5 04/27/2011 0700   GFRNONAA 42* 04/27/2011 0700   GFRAA 49* 04/27/2011 0700    Recommendations: 1. Maximal medical management of co-morbidities 2. Toe pressures with DBI 3. Diagnostic angiogram Friday or Monday: Monday more likely 4. HCO3 drip 12 hours prior to any angiographic procedure 5. Defer any toe or forefoot amputations to Ortho (Dr. Sharol Given)  Adele Barthel, MD [facility]: [phone] Pager: [phone]  05/02/2011, 10:22 PM

## 2011-05-02 NOTE — Progress Notes (Signed)
Occupational Therapy Session Note  Patient Details  Name: [name] MRN: [id number] Date of Birth: 1961/05/14  Today's Date: 05/02/2011 Time: 0730-0840 Time Calculation (min): 70 min  Precautions: Precautions Precautions: Fall Precaution Comments: fall, obesity Required Braces or Orthoses: No Restrictions Weight Bearing Restrictions: No Other Position/Activity Restrictions: diplopia with patch wearing schedule, homonimous hemianopsia, left inattention, left hemiplegia, apraxia, decreased sustained attention  Short Term Goals: OT Short Term Goal 1: Pt will complete bathing with mod assist OT Short Term Goal 2: Pt will complete UB dressing with mod assist OT Short Term Goal 3: Pt will complete toilet transfer with max assist using squat pivot technique OT Short Term Goal 4: Pt will complete grooming in midline seated position at sink with min cues to scan to obtain items  Skilled Therapeutic Interventions/Progress Updates:    Pt seen for ADL retraining with focus on bed mobility and transfers.  Mod assist rolling to left with assist to flex Lt knee and cues to use Rt foot to help lift Lt foot to EOB, max assist sidelying to sit with assist to lift, and max assist transfer to [facility].  Use of squatpivot to transfer to [facility] with max assist to lift and lower, slideboard transfer from [facility] with total assist +3 with one person to hold w/c steady and 2 for proper body mechanics and technique.  Engaged in sit to stand at sink with focus on midline orientation and weight shift to maintain midline in standing.  Cues to use vision to obtain midline.  Three musketeer technique with support below shoulder for sit to stand with pt performing about 60% with sit to stand and 75% stand to sit. Pain  No c/o pain.  Therapy/Group: Individual Therapy  [name] 05/02/2011, 8:54 AM

## 2011-05-02 NOTE — Progress Notes (Signed)
Social Work Patient ID: [name], female   DOB: [birth year], 50 y.o.   MRN: [id number]  Met with sister and pt when here to go through therapies with pt. Aware of team conf and goals pt is working toward. \Sister unsure if can provide the care pt will need at discharge.  She reports: "It looks like a lot of care." She wants To take pt home and pt wants to go home but at a level sister can manage her.  Continue to work toward discharge And get a commitment closer to discharge. Both are aware of the options.

## 2011-05-02 NOTE — Progress Notes (Signed)
Patient ID: Jeshia Hamblin, female   DOB: 05/24/1961, 50 y.o.   MRN: [id number] Patient ID: Makylie Uhles, female   DOB: [birth year], 50 y.o.   MRN: [id number]     Subjective/Complaints: No new complaints, doesn't know how she feels yet.  Comfortable night.  Objective: Vital Signs: Blood pressure 119/84, pulse 69, temperature 97.6 F (36.4 C), temperature source Oral, resp. rate 19, height 5\' 7"  (1.702 m), weight 124.8 kg (275 lb 2.2 oz), SpO2 97.00%. No results found. No results found for this basename: WBC:2,HGB:2,HCT:2,PLT:2 in the last 72 hours CBG (last 3)   Basename 05/02/11 0722 05/01/11 2056 05/01/11 1653  GLUCAP 76 87 82   Results for orders placed during the hospital encounter of 04/26/11 (from the past 24 hour(s))  GLUCOSE, CAPILLARY     Status: Abnormal   Collection Time   05/01/11 11:17 AM      Component Value Range   Glucose-Capillary 130 (*) 70 - 99 (mg/dL)   Comment 1 Notify RN    GLUCOSE, CAPILLARY     Status: Normal   Collection Time   05/01/11  4:53 PM      Component Value Range   Glucose-Capillary 82  70 - 99 (mg/dL)   Comment 1 Notify RN    GLUCOSE, CAPILLARY     Status: Normal   Collection Time   05/01/11  8:56 PM      Component Value Range   Glucose-Capillary 87  70 - 99 (mg/dL)  PROTIME-INR     Status: Abnormal   Collection Time   05/02/11  7:00 AM      Component Value Range   Prothrombin Time 30.1 (*) 11.6 - 15.2 (seconds)   INR 2.82 (*) 0.00 - 1.49   GLUCOSE, CAPILLARY     Status: Normal   Collection Time   05/02/11  7:22 AM      Component Value Range   Glucose-Capillary 76  70 - 99 (mg/dL)   Comment 1 Notify RN      No results found for this basename: NA:2,K:2,CL:2,CO2:2,GLUCOSE:2,BUN:2,CREATININE:2,CALCIUM:2 in the last 72 hours BP Readings from Last 3 Encounters:  05/02/11 119/84  04/26/11 132/85  04/26/11 132/85   INR  Date Value Range Status  05/02/2011 2.82* 0.00-1.49 (no units) Final      Physical Exam: General appearance:  morbidly obese, flat affect. Resp: clear to auscultation bilaterally Cardio: regular rate and rhythm GI: soft, non-tender; bowel sounds normal; no masses,  no organomegaly Extremities: no edema, redness or tenderness in the calves or thighs Pulses: 2+ and symmetric Skin: Skin color, texture, turgor normal. No rashes or lesions Neurologic: Gait: working on pregait activities. Alert and oriented X 3, flat affect, left inattention, left hemiparesis UE(0/5)>LE.  Left hemisensory deficits plus insensate feet bilateral .  Poor awareness . Assessment/Plan: 1. Functional deficits secondary to  Right Cardioembolic CVA  which require 3+ hours per day of interdisciplinary therapy in a comprehensive inpatient rehab setting. Physiatrist is providing close team supervision and 24 hour management of active medical problems listed below. Physiatrist and rehab team continue to assess barriers to discharge/monitor patient progress toward functional and medical goals. 2.  Diabetes- stable 3.  Anemia- unchanged 4.  HTN- controlled Mobility: Bed Mobility Bed Mobility: Yes Rolling Right: 2: Max assist Rolling Left: 3: Mod assist Rolling Left Details (indicate cue type and reason): Assist to flex left leg, and cues to maintain flexion in right leg.  Utilized bed rail with tactile cue to locate Supine to Sit: 1: +1  Total assist Sit to Supine - Left: 1: +1 Total assist Transfers Transfers: Yes Sit to Stand: 1: +2 Total assist Stand to Sit: 1: +2 Total assist Lateral/Scoot Transfers: With slide board;1: +2 Total assist Lateral/Scoot Transfer Details (indicate cue type and reason): Second person needed to stabilize equipment during transfer,  Slide board transfer to right Ambulation/Gait Ambulation/Gait Assistance: 2: Max assist Stairs: No (unsafe to attempt at this time, unable to achive standing) Wheelchair Mobility Wheelchair Mobility: Yes Distance:  (63ft) ADL:   Cognition: Cognition Overall  Cognitive Status: Impaired Arousal/Alertness: Other (comment) (tired) Orientation Level: Oriented to person;Oriented to place;Oriented to time Attention: Sustained Sustained Attention: Impaired Sustained Attention Impairment: Verbal basic;Functional basic Memory: Impaired Memory Impairment: Storage deficit;Retrieval deficit;Decreased recall of new information Awareness: Impaired Awareness Impairment: Intellectual impairment (of swalloing precautions and physical deficits) Problem Solving: Impaired Problem Solving Impairment: Functional basic;Verbal basic Behaviors: Perseveration;Other (comment) (flat affect) Cognition Arousal/Alertness: Other (comment) (tired) Orientation Level: Oriented to person;Oriented to place;Oriented to time  2. Anticoagulation/DVT prophylaxis with Pharmaceutical: Coumdin .  Continue aspirin and Lovenox  till INR therapeutic.  3. Pain Management: Monitor for now for OA symptoms.  Patient Active Hospital Problem List: DIABETES MELLITUS, TYPE II (01/03/2007)   Assessment: blood sugars 12-150 range   Plan: Amaryl added for control. Monitor over next few days. HYPERLIPIDEMIA (01/03/2007)   Assessment:    Plan: continue zocor DEPRESSION (01/03/2007)   Assessment: flat and seems disinterested.   Plan: Started on celexa.  Ego support by team.  SW to follow up today. NEUROPATHY, IDIOPATHIC PERIPHERAL NOS (01/07/2007)   Assessment: Pain BLE especially with increased activity per patient   Plan: Continue neurontin but increase to QID ANEMIA (07/07/2010)   Assessment: ?etiology.    Plan: Anemia panel with low iron stores.  Continue iron supplement.  Will monitor H and H for now.  Check stool guaiacs.   HYPERTENSION (07/07/2010)   Assessment:  systolic's resonalble.  Monitor for trends.   Plan: Continue losartan, metoprolol, furosemide Cardiomyopathy (04/25/2011)    POA: Unknown   Assessment: EF @ 20-25%.  Likely contributing to SOB with activity.   Plan: Monitor for  symptoms.  Monitor for signs of overload.  Will add low salt and CM restrictions. Dysphagia:  On DI diet due to pocketing and impulsivity. D/C ASA   KIRSTEINS,ANDREW E 05/02/2011, 8:23 AM

## 2011-05-02 NOTE — Progress Notes (Signed)
Physical Therapy Session Note  Patient Details  Name: [name] MRN: [id number] Date of Birth: 28-Jul-1960  Today's Date: 05/02/2011 Time: [id number] Time Calculation (min): 70 min  Precautions: Precautions Precautions: Fall Precaution Comments: fall, obesity Required Braces or Orthoses:  (Left PRAFO in bed) Restrictions Weight Bearing Restrictions: No Other Position/Activity Restrictions: diplopia with patch wearing schedule, homonimous hemianopsia, left inattention, left hemiplegia, apraxia, absent left sided sensation, decreased sustained attention  Short Term Goals: PT Short Term Goal 1: Pt will roll left with max assist, roll right with mod assist. PT Short Term Goal 2: Pt will supine-sit with max assist. PT Short Term Goal 3: Pt will transfer with max assist. PT Short Term Goal 4: W/C mobility x 50 feet mod assist in controlled environment. PT Short Term Goal 5: Tolerate standing > 3 min with mechanical lift vs. total assist +2 to initiate weight bearing and pregait activities.  Skilled Therapeutic Interventions/Progress Updates:     General Chart Reviewed: Yes   Pain Pain Assessment Pain Assessment: No/denies pain Pain Score: 0-No pain Mobility Bed Mobility Rolling Left: 3: Mod assist;With rail Rolling Left Details: Manual facilitation for placement;Tactile cues for sequencing;Verbal cues for technique Supine to Sit: 1: +1 Total assist;HOB flat Supine to Sit Details: Tactile cues for sequencing;Verbal cues for technique;Manual facilitation for placement;Manual facilitation for weight shifting Transfers Lateral/Scoot Transfers: 2: Max assist;Other (comment) (with slideboard) Lateral/Scoot Transfer Details: Manual facilitation for weight shifting;Manual facilitation for placement;Tactile cues for sequencing;Tactile cues for posture   Other Treatments Treatments Therapeutic Activity: With maxi sky and sarah plus to act as walker, standing pregait activities with  manual facilitation and emphasis on left sided activation including reaching with trunk elongation to right and across midline, right heel lifts, stepping forward and laterally with right foot. Intermittent and fleeting left sided contractions palpated, however inconsistent and not sustained. Manual facilitation on left side throughout for alignment at hip and trunk (+2 skilled assist). Gait trial with maxi sky and Sarah plus as walker x 12 feet total assist + 2 for skilled facilitation for alignment, sequencing, weight shifting in all planes, and trunk elongation. No active left LE contraction palpated during gait, pt shifted all weight onto harness during right swing phase even with manual facilitation throughout. ? dense left hemisensory deficits vs. Apraxia, likely both. Will continue to assess and problem solve to continue to initiate and increase functional use of left side. Neuromuscular Facilitation: Left;Lower Extremity;Forced use  Therapy/Group: Individual Therapy  Kendrick Ranch 05/02/2011, 2:57 PM

## 2011-05-02 NOTE — Patient Care Conference (Signed)
Inpatient RehabilitationTeam Conference Note Date: 05/02/2011   Time: 2:07 PM    Patient Name: [name]      Medical Record Number: [id number]  Date of Birth: [birth year] Sex: Female         Room/Bed: 4001/4001-01 Payor Info: Payor: MEDICAID [place]  Plan: MEDICAID [place] ACCESS  Product Type: *No Product type*     Admitting Diagnosis: (r) cva  Admit Date/Time:  04/26/2011  2:12 PM Admission Comments: No comment available   Primary Diagnosis:  CVA (cerebral infarction) Principal Problem: CVA (cerebral infarction)  Patient Active Problem List  Diagnoses Date Noted  . CVA (cerebral infarction) 04/26/2011  . Cardiomyopathy 04/25/2011  . CVA (cerebral vascular accident) 04/22/2011  . Tachycardia   . TACHYCARDIA 07/25/2010  . OBESITY 07/07/2010  . ANEMIA 07/07/2010  . HYPERTENSION 07/07/2010  . CVA 07/07/2010  . DVT 07/07/2010  . RENAL FAILURE 07/07/2010  . OSTEOMYELITIS 07/07/2010  . SLEEP APNEA 07/07/2010  . NEUROPATHY, IDIOPATHIC PERIPHERAL NOS 01/07/2007  . DIABETES MELLITUS, TYPE II 01/03/2007  . HYPERLIPIDEMIA 01/03/2007  . ANXIETY 01/03/2007  . DEPRESSION 01/03/2007  . GERD 01/03/2007  . DEGENERATIVE JOINT DISEASE 01/03/2007  . Lumbago 01/03/2007  . HEADACHE 01/03/2007    Expected Discharge Date: Expected Discharge Date:  (4 weeks)  Team Members Present: Physician: Dr. Alysia Penna Case Manager Present: Sherry Fall, RN Social Worker Present: Ovidio Kin, LCSW PT Present: Karna Christmas, PT OT Present: [name], OT SLP Present: Weston Anna, SLP RN Present: Altamese Dilling, RN    Current Status/Progress Goal Weekly Team Focus  Medical   very limited neuro recovery  improve LLE control  Intensive PT/OT   Bowel/Bladder   patient is continent of bowel and bladder  patient will remain continent of bowel and bladder with no accidents. patient will have a BM every 2 days. patient will need mod assistance.  patient will remain continent. patient  will progress towards moderate assistance   Swallow/Nutrition/ Hydration   tolerating D1/thins; ready for trials upgraded solids.    Supervision      ADL's   mod A bathing, mod A UB dsg, max A LB dsg, total A transfers  min A   increased activity tolerance, upright sitting balance   Mobility   total assist + 1-2 W/C level with use of slideboard  min assist w/c level  positioning, attention to left, care of left UE, sustained attention, increase out of bed time   Communication             Safety/Cognition/ Behavioral Observations  mod deficits attention, mod-severe problem-solving/exec functioning; improved awareness  min-mod  left attention; prob solving; awareness   Pain   patient is not currently complaining of pain.  patient's pain will be <2.  Staff will assess pain every shift and PRN to keep patient <2.   Skin   patient has an open wound tp left great toe. no other skin breakdown at this time.  patient will have continued wound healing to left toe. patient will have no further skin breakdown  staff will change dressing to left great toe daily. staff will assess patient's skin daily.      *See Interdisciplinary Assessment and Plan and progress notes for long and short-term goals  Barriers to Discharge: Question if family will be able to provide needed assist    Possible Resolutions to Barriers:  Family training and education    Discharge Planning/Teaching Needs:  Plan to go home with sister providing care unsure if sister realizes  how much care pt will require. Will encourage sister to come in and observe and begin learning her care. Will look into PCS services      Team Discussion:  Discussed dx, hx, evals, goals, d/c plan. Pt with apraxia, poor attention, (B) DM neuropathy.Team concerned about pt's sister's ability to provide level of care. Plan to request sister to be present for therapies to observe.   May need to downgrade goals.       Revisions to Treatment Plan:  . none   Continued Need for Acute Rehabilitation Level of Care: The patient requires daily medical management by a physician with specialized training in physical medicine and rehabilitation for the following conditions: Daily direction of a multidisciplinary physical rehabilitation program to ensure safe treatment while eliciting the highest outcome that is of practical value to the patient.: Yes Daily medical management of patient stability for increased activity during participation in an intensive rehabilitation regime.: Yes Daily analysis of laboratory values and/or radiology reports with any subsequent need for medication adjustment of medical intervention for : Neurological problems;Cardiac problems  Met with pt and her sister and nephew to discuss Team Conference. Pt's sister is PCG and reported that she has back problems and would not be able to lift, but she wants to take pt home if possible. She mentioned possibility of SNF placement.  Discussed accessibility of home, requested that she bring doorway measurements. Requested that she observe therapies as much as possible. She was in agreement with goals and 4 week LOS with d/c date to be determined next week. Informed her that goals may need to be downgraded. [name] 05/02/2011, 2:07 PM

## 2011-05-02 NOTE — Progress Notes (Signed)
Patient is alert and oriented x1. She has not requested any pain meds this shift. Patient's blood glucose did not need any insulin coverage this shift. Eye patch alternated as ordered. Patient was max x2 for slide board transfer to bed, and SARA lift was used to get patient onto the toilet. Continue current plan of care.

## 2011-05-03 LAB — GLUCOSE, CAPILLARY
Glucose-Capillary: 85 mg/dL (ref 70–99)
Glucose-Capillary: 103 mg/dL — ABNORMAL HIGH (ref 70–99)
Glucose-Capillary: 89 mg/dL (ref 70–99)

## 2011-05-03 NOTE — Progress Notes (Signed)
PT = 34.3, INR up to 3.33 A: CVA. INR has been steadily trending up now > goal 2-3. P: Hold Coumadin x 1 days and will decrease maintenance dose tomorrow.

## 2011-05-03 NOTE — Progress Notes (Signed)
[facility]  Daily Progress Note   Awaiting toe pressures and BLE ABI.  Will check on patient again tomorrow.  Dr. Scot Dock will be covering for me today.  Adele Barthel, MD [facility] Office: [phone] Pager: [phone]  05/03/2011, 6:24 AM

## 2011-05-03 NOTE — Progress Notes (Signed)
Patient ID: [name], female   DOB: 1960-07-19, 50 y.o.   MRN: [id number] Patient ID: [name], female   DOB: 1960-12-02, 50 y.o.   MRN: [id number] Patient ID: [name], female   DOB: [birth year], 50 y.o.   MRN: [id number]     Subjective/Complaints: No new complaints, doesn't know how she feels yet.  Comfortable night.  Objective: Vital Signs: Blood pressure 105/78, pulse 73, temperature 97.7 F (36.5 C), temperature source Oral, resp. rate 20, height 5\' 7"  (1.702 m), weight 124.8 kg (275 lb 2.2 oz), SpO2 100.00%. No results found. No results found for this basename: WBC:2,HGB:2,HCT:2,PLT:2 in the last 72 hours CBG (last 3)   Basename 05/03/11 0707 05/02/11 2104 05/02/11 1631  GLUCAP 85 113* 77   Results for orders placed during the hospital encounter of 04/26/11 (from the past 24 hour(s))  GLUCOSE, CAPILLARY     Status: Normal   Collection Time   05/02/11 11:47 AM      Component Value Range   Glucose-Capillary 92  70 - 99 (mg/dL)   Comment 1 Notify RN    GLUCOSE, CAPILLARY     Status: Normal   Collection Time   05/02/11  4:31 PM      Component Value Range   Glucose-Capillary 77  70 - 99 (mg/dL)   Comment 1 Notify RN    GLUCOSE, CAPILLARY     Status: Abnormal   Collection Time   05/02/11  9:04 PM      Component Value Range   Glucose-Capillary 113 (*) 70 - 99 (mg/dL)   Comment 1 Notify RN    GLUCOSE, CAPILLARY     Status: Normal   Collection Time   05/03/11  7:07 AM      Component Value Range   Glucose-Capillary 85  70 - 99 (mg/dL)   Comment 1 Notify RN      No results found for this basename: NA:2,K:2,CL:2,CO2:2,GLUCOSE:2,BUN:2,CREATININE:2,CALCIUM:2 in the last 72 hours BP Readings from Last 3 Encounters:  05/03/11 105/78  04/26/11 132/85  04/26/11 132/85   INR  Date Value Range Status  05/02/2011 2.82* 0.00-1.49 (no units) Final      Physical Exam: General appearance: morbidly obese, flat affect. Resp: clear to auscultation bilaterally Cardio:  regular rate and rhythm GI: soft, non-tender; bowel sounds normal; no masses,  no organomegaly Extremities: no edema, redness or tenderness in the calves or thighs Pulses: 2+ and symmetric Skin: Skin color, texture, turgor normal. No rashes or lesions Neurologic: Gait: working on pregait activities. Alert and oriented X 3, flat affect, left inattention, left hemiparesis UE(0/5)>LE except 2- L triceps.  Left hemisensory deficits plus insensate feet bilateral .  Poor awareness . Assessment/Plan: 1. Functional deficits secondary to  Right Cardioembolic CVA  which require 3+ hours per day of interdisciplinary therapy in a comprehensive inpatient rehab setting. Physiatrist is providing close team supervision and 24 hour management of active medical problems listed below. Physiatrist and rehab team continue to assess barriers to discharge/monitor patient progress toward functional and medical goals. 2.  Diabetes- stable 3.  Anemia- unchanged 4.  HTN- controlled Mobility: Bed Mobility Bed Mobility: Yes Rolling Right: 2: Max assist Rolling Left: 3: Mod assist;With rail Rolling Left Details (indicate cue type and reason): Assist to flex left leg, and cues to maintain flexion in right leg.  Utilized bed rail with tactile cue to locate Supine to Sit: 1: +1 Total assist;HOB flat Sit to Supine - Left: 1: +1 Total assist Transfers Transfers: Yes Sit to  Stand: 1: +2 Total assist Stand to Sit: 1: +2 Total assist Lateral/Scoot Transfers: 2: Max assist;Other (comment) (with slideboard) Lateral/Scoot Transfer Details (indicate cue type and reason): Second person needed to stabilize equipment during transfer,  Slide board transfer to right Ambulation/Gait Ambulation/Gait Assistance: 1: +2 Total assist;Patient percentage (comment) (pt = < 25%) Stairs: No (unsafe to attempt at this time, unable to achive standing) Wheelchair Mobility Wheelchair Mobility: Yes Distance:  (31ft) ADL:    Cognition: Cognition Overall Cognitive Status: Impaired Arousal/Alertness: Other (comment) (tired) Orientation Level: Oriented to person;Oriented to place;Disoriented to time Attention: Sustained Sustained Attention: Impaired Sustained Attention Impairment: Verbal basic;Functional basic Memory: Impaired Memory Impairment: Storage deficit;Retrieval deficit;Decreased recall of new information Awareness: Impaired Awareness Impairment: Intellectual impairment (of swalloing precautions and physical deficits) Problem Solving: Impaired Problem Solving Impairment: Functional basic;Verbal basic Behaviors: Perseveration;Other (comment) (flat affect) Cognition Arousal/Alertness: Other (comment) (tired) Orientation Level: Oriented to person;Oriented to place;Disoriented to time  2. Anticoagulation/DVT prophylaxis with Pharmaceutical: Coumdin .  Continue aspirin and Lovenox  till INR therapeutic.  3. Pain Management: Monitor for now for OA symptoms.  Patient Active Hospital Problem List: DIABETES MELLITUS, TYPE II (01/03/2007)   Assessment: blood sugars 12-150 range   Plan: Amaryl added for control. Monitor over next few days. HYPERLIPIDEMIA (01/03/2007)   Assessment:    Plan: continue zocor DEPRESSION (01/03/2007)   Assessment: flat and seems disinterested.   Plan: Started on celexa.  Ego support by team.   NEUROPATHY, IDIOPATHIC PERIPHERAL NOS (01/07/2007)   Assessment: Pain BLE especially with increased activity per patient   Plan: Continue neurontin but increase to QID ANEMIA (07/07/2010)   Assessment: ?etiology.    Plan: Anemia panel with low iron stores.  Continue iron supplement.  Will monitor H and H for now.  Check stool guaiacs.   HYPERTENSION (07/07/2010)   Assessment:  systolic's resonalble.  Monitor for trends.   Plan: Continue losartan, metoprolol, furosemide Cardiomyopathy (04/25/2011)    POA: Unknown   Assessment: EF @ 20-25%.  Likely contributing to SOB with activity.    Plan: Monitor for symptoms.  Monitor for signs of overload.  Will add low salt and CM restrictions. Dysphagia:  On DI diet due to pocketing and impulsivity. D/C ASA   Alysia Penna E 05/03/2011, 7:26 AM

## 2011-05-03 NOTE — Progress Notes (Signed)
Patient is alert and oriented x2 and knows her family. She has denied any pain this shift or discomfort this shift. Toe dressing done as ordered. Patient's blood glucose has not required any insulin this shift. Continue current plan of care.

## 2011-05-04 DIAGNOSIS — I6350 Cerebral infarction due to unspecified occlusion or stenosis of unspecified cerebral artery: Secondary | ICD-10-CM

## 2011-05-04 LAB — GLUCOSE, CAPILLARY: Glucose-Capillary: 112 mg/dL — ABNORMAL HIGH (ref 70–99)

## 2011-05-04 LAB — PROTIME-INR: INR: 3.19 — ABNORMAL HIGH (ref 0.00–1.49)

## 2011-05-04 MED ORDER — METHYLPHENIDATE HCL 5 MG PO TABS
5.0000 mg | ORAL_TABLET | Freq: Two times a day (BID) | ORAL | Status: DC
  Administered 2011-05-04 – 2011-05-06 (×6): 5 mg via ORAL
  Filled 2011-05-04 (×6): qty 1

## 2011-05-04 MED ORDER — WARFARIN SODIUM 2.5 MG PO TABS
2.5000 mg | ORAL_TABLET | Freq: Once | ORAL | Status: AC
  Administered 2011-05-04: 18:00:00 via ORAL
  Filled 2011-05-04: qty 1

## 2011-05-04 NOTE — Progress Notes (Signed)
PT = 33.2, INR 3.19 (down from 3.33)  A: CVA. INR rose slightly > goal 2-3 but now declining.  Pt has one episode of severe HTN yesterday 152/97 (losartan, lopressor, lasix)  P: Give Coumadin 2.5mg  today x 1 to prevent INR from dropping to rapidly from held dose.

## 2011-05-04 NOTE — Progress Notes (Signed)
Patient ID: [name], female   DOB: 08-03-1960, 50 y.o.   MRN: [id number]     Subjective/Complaints: No new complaints, doesn't know how she feels yet.  Comfortable night.  Objective: Vital Signs: Blood pressure 128/83, pulse 76, temperature 97.5 F (36.4 C), temperature source Oral, resp. rate 18, height 5\' 7"  (1.702 m), weight 124.8 kg (275 lb 2.2 oz), SpO2 96.00%. No results found. No results found for this basename: WBC:2,HGB:2,HCT:2,PLT:2 in the last 72 hours CBG (last 3)   Basename 05/03/11 2054 05/03/11 1940 05/03/11 1639  GLUCAP 96 89 103*   Results for orders placed during the hospital encounter of 04/26/11 (from the past 24 hour(s))  GLUCOSE, CAPILLARY     Status: Abnormal   Collection Time   05/03/11 11:58 AM      Component Value Range   Glucose-Capillary 116 (*) 70 - 99 (mg/dL)   Comment 1 Notify RN    GLUCOSE, CAPILLARY     Status: Abnormal   Collection Time   05/03/11  4:39 PM      Component Value Range   Glucose-Capillary 103 (*) 70 - 99 (mg/dL)   Comment 1 Notify RN    GLUCOSE, CAPILLARY     Status: Normal   Collection Time   05/03/11  7:40 PM      Component Value Range   Glucose-Capillary 89  70 - 99 (mg/dL)   Comment 1 Notify RN    GLUCOSE, CAPILLARY     Status: Normal   Collection Time   05/03/11  8:54 PM      Component Value Range   Glucose-Capillary 96  70 - 99 (mg/dL)   Comment 1 Notify RN      No results found for this basename: NA:2,K:2,CL:2,CO2:2,GLUCOSE:2,BUN:2,CREATININE:2,CALCIUM:2 in the last 72 hours BP Readings from Last 3 Encounters:  05/04/11 128/83  04/26/11 132/85  04/26/11 132/85   INR  Date Value Range Status  05/03/2011 3.33* 0.00-1.49 (no units) Final      Physical Exam: General appearance: morbidly obese, flat affect. Resp: clear to auscultation bilaterally Cardio: regular rate and rhythm GI: soft, non-tender; bowel sounds normal; no masses,  no organomegaly Extremities: no edema, redness or tenderness in the  calves or thighs Pulses: 2+ and symmetric Skin: Skin color, texture, turgor normal. No rashes or lesions Neurologic: Gait: working on pregait activities. Alert and oriented X 3, flat affect, left inattention, left hemiparesis UE(0/5)>LE except 2- L triceps.  Left hemisensory deficits plus insensate feet bilateral .  Poor awareness . Assessment/Plan: 1. Functional deficits secondary to  Right Cardioembolic CVA  which require 3+ hours per day of interdisciplinary therapy in a comprehensive inpatient rehab setting. Physiatrist is providing close team supervision and 24 hour management of active medical problems listed below. Physiatrist and rehab team continue to assess barriers to discharge/monitor patient progress toward functional and medical goals. 2.  Diabetes- stable 3.  Anemia- unchanged 4.  HTN- controlled Mobility: Bed Mobility Bed Mobility: Yes Rolling Right: 2: Max assist Rolling Left: 3: Mod assist;With rail Rolling Left Details (indicate cue type and reason): Assist to flex left leg, and cues to maintain flexion in right leg.  Utilized bed rail with tactile cue to locate Supine to Sit: 1: +1 Total assist;HOB flat Sit to Supine - Left: 1: +1 Total assist Transfers Transfers: Yes Sit to Stand: 1: +2 Total assist Stand to Sit: 1: +2 Total assist Lateral/Scoot Transfers: 2: Max assist;Other (comment) (with slideboard) Lateral/Scoot Transfer Details (indicate cue type and reason): Second person  needed to stabilize equipment during transfer,  Slide board transfer to right Ambulation/Gait Ambulation/Gait Assistance: 1: +2 Total assist;Patient percentage (comment) (pt = < 25%) Stairs: No (unsafe to attempt at this time, unable to achive standing) Wheelchair Mobility Wheelchair Mobility: Yes Distance:  (20ft) ADL:   Cognition: Cognition Overall Cognitive Status: Impaired Arousal/Alertness: Other (comment) (tired) Orientation Level: Oriented to person;Oriented to  place Attention: Sustained Sustained Attention: Impaired Sustained Attention Impairment: Verbal basic;Functional basic Memory: Impaired Memory Impairment: Storage deficit;Retrieval deficit;Decreased recall of new information Awareness: Impaired Awareness Impairment: Intellectual impairment (of swalloing precautions and physical deficits) Problem Solving: Impaired Problem Solving Impairment: Functional basic;Verbal basic Behaviors: Perseveration;Other (comment) (flat affect) Cognition Arousal/Alertness: Other (comment) (tired) Orientation Level: Oriented to person;Oriented to place  2. Anticoagulation/DVT prophylaxis with Pharmaceutical: Coumdin .  Continue aspirin and Lovenox  till INR therapeutic.  3. Pain Management: Monitor for now for OA symptoms.  Patient Active Hospital Problem List: DIABETES MELLITUS, TYPE II (01/03/2007)   Assessment: blood sugars 12-150 range   Plan: Amaryl added for control. Monitor over next few days. HYPERLIPIDEMIA (01/03/2007)   Assessment:    Plan: continue zocor DEPRESSION (01/03/2007)   Assessment: flat and seems disinterested.   Plan: Started on celexa.  Ego support by team.   NEUROPATHY, IDIOPATHIC PERIPHERAL NOS (01/07/2007)   Assessment: Pain BLE especially with increased activity per patient   Plan: Continue neurontin but increase to QID ANEMIA (07/07/2010)   Assessment: ?etiology.    Plan: Anemia panel with low iron stores.  Continue iron supplement.  Will monitor H and H for now.  Check stool guaiacs.   HYPERTENSION (07/07/2010)   Assessment:  systolic's resonalble.  Monitor for trends.   Plan: Continue losartan, metoprolol, furosemide Cardiomyopathy (04/25/2011)    POA: Unknown   Assessment: EF @ 20-25%.  Likely contributing to SOB with activity.   Plan: Monitor for symptoms.  Monitor for signs of overload.  Will add low salt and CM restrictions. Dysphagia:  On DI diet due to pocketing and impulsivity. D/C ASA   Alysia Penna  E 05/04/2011, 7:35 AM

## 2011-05-04 NOTE — Progress Notes (Signed)
Speech Language Pathology Speech Language Pathology Session Note   Skilled Therapeutic Interventions: Pt awake in bed with sister in room. Treatment focus on trials of Dys. 2 textures for possible upgrade. Pt without overt s/s of aspiration with trials with minimal left pocketing that cleared when followed by a sip of liquid and intermittent finger sweep. Both pt and sister educated on appropriate textures for new upgraded diet and compensatory strategies. Both verbalized understanding. Sorting/scanning task with change. Max verbal, visual, question and tactile cues needed to tend to left and self-monitor and correct errors.   Therapy Start Time/End Time: Time Calculation Start Time: [id number] Stop Time: 1440 Time Calculation (min): 45 min  Pain:No/Denies Pain   FIM: Comprehension Comprehension Mode: Auditory Comprehension: 4-Understands basic 75 - 89% of the time/requires cueing 10 - 24% of the time Expression Expression Mode: Verbal Expression: 4-Expresses basic 75 - 89% of the time/requires cueing 10 - 24% of the time. Needs helper to occlude trach/needs to repeat words. Social Interaction Social Interaction: 3-Interacts appropriately 50 - 74% of the time - May be physically or verbally inappropriate. Problem Solving Problem Solving Mode: Not assessed Problem Solving: 1-Solves basic less than 25% of the time - needs direction nearly all the time or does not effectively solve problems and may need a restraint for safety Memory Memory Mode: Not assessed Memory: 2-Recognizes or recalls 25 - 49% of the time/requires cueing 51 - 75% of the time   Therapy/Group: Individual Therapy     [name], [place] 05/04/2011 2:43 PM

## 2011-05-04 NOTE — Progress Notes (Signed)
[facility]  Daily Progress Note   Awaiting toe pressures and DBI.  The DBI's predictive value is reasonable despite medial calcification in this patient's arteries.  DBI > 0.60 predicts success after toe amputation.  I am trying to avoid a diagnostic angiogram in this patient if possible, as I don't think she is a bypass candidate anyway.  The diagnostic angiogram would be more useful as a preoperative planning tool.  The family and the patient may insist on one anyway before proceeding before any type of amputation.  Adele Barthel, MD [facility] Office: [phone] Pager: [phone]  05/04/2011, 8:04 AM

## 2011-05-04 NOTE — Progress Notes (Signed)
*  PRELIMINARY RESULTS*  Carotid Dopplers  has been performed.  No significant ICA stenosis bilaterally. Vertebral arteries are patent with antegrade flow.  [name] 05/04/2011, 12:56 PM

## 2011-05-04 NOTE — Progress Notes (Signed)
Physical Therapy Session Note  Patient Details  Name: [name] MRN: [id number] Date of Birth: 30-Jul-1960  Today's Date: 05/04/2011 Time: 1000-1055 Time Calculation (min): 55 min  Precautions: Precautions Precautions: Fall Precaution Comments: obesity, severe left neglect Required Braces or Orthoses:  (Left PRAFO in bed) Restrictions Weight Bearing Restrictions: No Other Position/Activity Restrictions: diplopia with patch wearing schedule, homonimous hemianopsia, left inattention, left hemiplegia, apraxia, absent left sided sensation, decreased sustained attention  Short Term Goals: PT Short Term Goal 1: Pt will roll left with max assist, roll right with mod assist. PT Short Term Goal 2: Pt will supine-sit with max assist. PT Short Term Goal 3: Pt will transfer with max assist. PT Short Term Goal 4: W/C mobility x 50 feet mod assist in controlled environment. PT Short Term Goal 5: Tolerate standing > 3 min with mechanical lift vs. total assist +2 to initiate weight bearing and pregait activities.  Skilled Therapeutic Interventions/Progress Updates: focus of treatment: to decrease assist sit to stand; initiate stepping on Rt LE in standing using Ethelene Hal ; Neuro reed LT LE in sidelying and supine           Pain No complaint of pain  Mobility : Transfers W/C to mat min/mod assist (level/scoot transfer) + assist for setup using sliding board                 Other Treatments  Sit to stand X 2 to EVA walker mod assist with MaxiSky sling for safety; Standing -pt able to stand X 2 to EVA walker for approximately 5 minutes with assist to attain terminal knee extension LLE and tactile cues to attain trunk extension. Pt assisted in standing to weight shift to Left and attempt step RLE. Pt unable to decrease weight bearing on uninvolved to allow step and maintain terminal knee extension of Left. Neuro reed: Lt hip flexion -pt able to actively flex Lt hip in gravity eliminated  position approximately 75% range and antigravity (supine) approximately 50 % range with assist. Unable to isolate left quads to perform SAQs. Pt able to perform hip IR and ER in hooklying position with assist to attain position.  Therapy/Group: Individual Therapy  Denis Carreon,JIM 05/04/2011, 10:35 AM

## 2011-05-04 NOTE — Progress Notes (Signed)
Occupational Therapy Weekly Progress Note  Patient Details  Name: [name] MRN: [id number] Date of Birth: 1960-07-01 Today's Date: 05/04/2011  Time [id number] 60 min  Pain:  Patient reports no pain now, but reports consistent pain "in therapy"  In both feet.  Patient unable to score pain.  Patient has met 1 of 4 short term goals.  Patient is showing significant pre-functional improvements in sitting balance static and dynamic, sit to stand transition, stand endurance, visual attention to left, visual scanning to left with cueing which help her to progress forward toward short term goals..  Patient continues to demonstrate the following deficits: Left neglect, dense left hemiplegia, decreased postural control,  diplopia, decreased sustained attention, decreased ability to separate eye movements from head movements,  impulsivity, and decreased awareness and therefore will continue to benefit from skilled OT intervention to enhance overall performance with ADL.  Patient progressing toward long term goals..  Continue plan of care. Need to work with family to ascertain if able to provide necessary assistance at discharge.  OT Short Term Goals: OT Short Term Goal 1: Pt will complete bathing with mod assist OT Short Term Goal 1 - Progress: Progressing toward goal (continue goal)  OT Short Term Goal 2: Pt will complete UB dressing with mod assist OT Short Term Goal 2 - Progress: Progressing toward goal (continue goal)  OT Short Term Goal 3: Pt will complete toilet transfer with max assist using squat pivot technique OT Short Term Goal 3 - Progress: Revised (modified due to lack of progress/goal met)  Revised OT Short term goal 3:  Patient will complete toilet transfer with one caregiver using slide board and drop arm commode and max assist  OT Short Term Goal 4: Pt will complete grooming in midline seated position at sink with min cues to scan to obtain items OT Short Term Goal 4 - Progress:  Met OT Short term goal 4 = Patient will complete grooming activities while seated at sink with initial verbal cue only.   Skilled Clinical Intervention:  Patient seen this am for ADL with emphasis on improving awareness and attention to left side of body and environment, visual scanning to left of midline, sustaining visual attention to left environment (5-10 sec), improving postural control for static to dynamic sitting balance during bathing and dressing tasks, and level surface transfers with a sliding board.     Individual treatment    Mariah Milling 05/04/2011, 10:01 AM

## 2011-05-04 NOTE — Progress Notes (Signed)
Occupational Therapy Session Note  Patient Details  Name: [name] MRN: [id number] Date of Birth: October 01, 1960  Today's Date: 05/04/2011 Time: [id number] Time Calculation (min): 26 min  Precautions: Precautions Precautions: Fall Precaution Comments: obesity, severe left neglect Required Braces or Orthoses:  (Left PRAFO in bed) Restrictions Weight Bearing Restrictions: No Other Position/Activity Restrictions: diplopia with patch wearing schedule, homonimous hemianopsia, left inattention, left hemiplegia, apraxia, absent left sided sensation, decreased sustained attention  Short Term Goals: OT Short Term Goal 1: Pt will complete bathing with mod assist OT Short Term Goal 1 - Progress: Progressing toward goal OT Short Term Goal 2: Pt will complete UB dressing with mod assist OT Short Term Goal 2 - Progress: Progressing toward goal OT Short Term Goal 3: Pt will complete toilet transfer with max assist using squat pivot technique OT Short Term Goal 3 - Progress: Revised (modified due to lack of progress/goal met) OT Short Term Goal 4: Pt will complete grooming in midline seated position at sink with min cues to scan to obtain items OT Short Term Goal 4 - Progress: Met  Skilled Therapeutic Interventions/Progress Updates:  Balance/vestibular training;Cognitive remediation/compensation;DME/adaptive equipment instruction;Functional mobility training;Neuromuscular re-education;Pain management;Patient/family education;Self Care/advanced ADL retraining;Therapeutic Activities;Therapeutic Exercise;Splinting/orthotics;UE/LE Strength taining/ROM;UE/LE Coordination activities;Visual/perceptual remediation/compensation;Wheelchair propulsion/positioning   Patient seen for second session to address ability to actively shift weight via lower body.  Patient currently initiates all weight shifts from head, and needs to initiate many transitional movements from pelvis / lower trunk.  Active weight bearing  through Bilateral Upper Extremities to encourage active trunk with body weight shift to left/ forward / backward, etc, with trunk extension and more forward position of pelvis.    General General Chart Reviewed: Yes Response to Previous Treatment: Patient with no complaints from previous session Family/Caregiver Present: No    Pain Pain Assessment Pain Assessment: 0-10 Pain Score:  (not scored: pain in feet in therapy) Pain Type: Acute pain Pain Location: Foot Pain Orientation: Right;Left Pain Onset:  (in therapy) ADL ADL Grooming: Supervision/safety;Setup Where Assessed-Grooming: Sitting at sink;Edge of bed;Wheelchair Upper Body Bathing: Moderate assistance Where Assessed-Upper Body Bathing: Edge of bed Lower Body Bathing: Dependent (+2 assist for pericare) Where Assessed-Lower Body Bathing: Edge of bed (sit to stand) Upper Body Dressing: Moderate assistance Where Assessed-Upper Body Dressing: Edge of bed Lower Body Dressing: Dependent Where Assessed-Lower Body Dressing: Edge of bed Toileting: Unable to assess (incontinent of bowel during session) Toilet Transfer: Unable to assess Tub/Shower Transfer: Unable to assess ADL Comments: Improved postural control, improved dynamic sitting balance, improved sit to stand and static stand tolerance  Therapy/Group: Individual Therapy   Mariah Milling 05/04/2011, 11:27 AM

## 2011-05-05 LAB — PROTIME-INR
INR: 2.88 — ABNORMAL HIGH (ref 0.00–1.49)
Prothrombin Time: 30.6 s — ABNORMAL HIGH (ref 11.6–15.2)

## 2011-05-05 LAB — GLUCOSE, CAPILLARY
Glucose-Capillary: 94 mg/dL (ref 70–99)
Glucose-Capillary: 125 mg/dL — ABNORMAL HIGH (ref 70–99)

## 2011-05-05 MED ORDER — WARFARIN SODIUM 2.5 MG PO TABS
2.5000 mg | ORAL_TABLET | Freq: Once | ORAL | Status: AC
  Administered 2011-05-05: 2.5 mg via ORAL
  Filled 2011-05-05: qty 1

## 2011-05-05 NOTE — Progress Notes (Signed)
Occupational Therapy Session Note  Patient Details  Name: Jamariana Sforza MRN: [id number] Date of Birth: 1961/05/10  Today's Date: 05/05/2011 Time: 10:30-12:00 Total time- 90 minutes Precautions: Precautions Precautions: Fall Precaution Comments: obesity, severe left neglect Required Braces or Orthoses:  (Left PRAFO in bed) Restrictions Weight Bearing Restrictions: No Other Position/Activity Restrictions: diplopia with patch wearing schedule, homonimous hemianopsia, left inattention, left hemiplegia, apraxia, absent left sided sensation, decreased sustained attention  Short Term Goals: OT Short Term Goal 1: Pt will complete bathing with mod assist OT Short Term Goal 1 - Progress: Progressing toward goal OT Short Term Goal 2: Pt will complete UB dressing with mod assist OT Short Term Goal 2 - Progress: Progressing toward goal OT Short Term Goal 3: Pt will complete toilet transfer with max assist using squat pivot technique OT Short Term Goal 3 - Progress: Revised (modified due to lack of progress/goal met) OT Short Term Goal 4: Pt will complete grooming in midline seated position at sink with min cues to scan to obtain items OT Short Term Goal 4 - Progress: Met  Skilled Therapeutic Interventions/Progress Updates:    Pt. Engaged in therapeutic bathing and dressing at wc level in front of sink.  Addressed sit to stand, standing balance, attention to the left, crossing midline, sitting balance, LUE neurmuscular reeducation.  Pt did sit to stand with total assist +2 for pericare (pt did 50% of work).  She required verbal cues to stand in midline and physical assist to stabilize left knee.  Pt. Recalled names of people working with her and was oriented to time.  She maintained selective attention during entire session.    Pain none   ADL: see FIM       Therapy/Group: Individual Therapy  Lisa Roca 05/05/2011, 11:42 AM

## 2011-05-05 NOTE — Progress Notes (Signed)
Patient ID: [name], female   DOB: Jan 03, 1961, 50 y.o.   MRN: [id number] Patient ID: [name], female   DOB: 1960/08/25, 50 y.o.   MRN: [id number]     Subjective/Complaints: No new complaints, doesn't know how she feels yet.  Comfortable night.  Objective: Vital Signs: Blood pressure 135/80, pulse 80, temperature 97.8 F (36.6 C), temperature source Oral, resp. rate 19, height 5\' 7"  (1.702 m), weight 124.8 kg (275 lb 2.2 oz), SpO2 92.00%. No results found. No results found for this basename: WBC:2,HGB:2,HCT:2,PLT:2 in the last 72 hours CBG (last 3)   Basename 05/04/11 2042 05/04/11 1126 05/04/11 0703  GLUCAP 112* 115* 84   Results for orders placed during the hospital encounter of 04/26/11 (from the past 24 hour(s))  GLUCOSE, CAPILLARY     Status: Normal   Collection Time   05/04/11  7:03 AM      Component Value Range   Glucose-Capillary 84  70 - 99 (mg/dL)   Comment 1 Notify RN    GLUCOSE, CAPILLARY     Status: Abnormal   Collection Time   05/04/11 11:26 AM      Component Value Range   Glucose-Capillary 115 (*) 70 - 99 (mg/dL)   Comment 1 Notify RN    GLUCOSE, CAPILLARY     Status: Abnormal   Collection Time   05/04/11  8:42 PM      Component Value Range   Glucose-Capillary 112 (*) 70 - 99 (mg/dL)   Comment 1 Notify RN      No results found for this basename: NA:2,K:2,CL:2,CO2:2,GLUCOSE:2,BUN:2,CREATININE:2,CALCIUM:2 in the last 72 hours BP Readings from Last 3 Encounters:  05/05/11 135/80  04/26/11 132/85  04/26/11 132/85   INR  Date Value Range Status  05/04/2011 3.19* 0.00-1.49 (no units) Final      Physical Exam: General appearance: morbidly obese, flat affect. Resp: clear to auscultation bilaterally Cardio: regular rate and rhythm GI: soft, non-tender; bowel sounds normal; no masses,  no organomegaly Extremities: no edema, redness or tenderness in the calves or thighs Pulses: 2+ and symmetric Skin: Skin color, texture, turgor normal. No rashes or  lesions Neurologic: Gait: working on pregait activities. Alert and oriented X 3, flat affect, left inattention, left hemiparesis UE(0/5)>LE except 2- L triceps.  Left hemisensory deficits plus insensate feet bilateral .  Poor awareness . Assessment/Plan: 1. Functional deficits secondary to  Right Cardioembolic CVA  which require 3+ hours per day of interdisciplinary therapy in a comprehensive inpatient rehab setting. Physiatrist is providing close team supervision and 24 hour management of active medical problems listed below. Physiatrist and rehab team continue to assess barriers to discharge/monitor patient progress toward functional and medical goals. 2.  Diabetes- stable 3.  Anemia- unchanged 4.  HTN- controlled Mobility: Bed Mobility Bed Mobility: Yes Rolling Right: 2: Max assist Rolling Left: 3: Mod assist;With rail Rolling Left Details (indicate cue type and reason): Assist to flex left leg, and cues to maintain flexion in right leg.  Utilized bed rail with tactile cue to locate Supine to Sit: 1: +1 Total assist;HOB flat Sit to Supine - Left: 1: +1 Total assist Transfers Transfers: Yes Sit to Stand: 1: +2 Total assist Stand to Sit: 1: +2 Total assist Lateral/Scoot Transfers: 2: Max assist;Other (comment) (with slideboard) Lateral/Scoot Transfer Details (indicate cue type and reason): Second person needed to stabilize equipment during transfer,  Slide board transfer to right Ambulation/Gait Ambulation/Gait Assistance: 1: +2 Total assist;Patient percentage (comment) (pt = < 25%) Stairs: No (unsafe to  attempt at this time, unable to achive standing) Wheelchair Mobility Wheelchair Mobility: Yes Distance:  (54ft) ADL:   Cognition: Cognition Overall Cognitive Status: Impaired Arousal/Alertness: Awake/alert Orientation Level: Oriented to person;Oriented to place;Oriented to situation Attention: Sustained Sustained Attention: Impaired Sustained Attention Impairment: Functional  basic Memory: Impaired Memory Impairment: Retrieval deficit Awareness: Impaired Awareness Impairment: Intellectual impairment Problem Solving: Impaired Problem Solving Impairment: Functional basic;Verbal basic Behaviors: Perseveration;Other (comment) (flat affect) Safety/Judgment: Impaired Comments: Moves very quickly at times, impulsive Cognition Arousal/Alertness: Awake/alert Orientation Level: Oriented to person;Oriented to place;Oriented to situation  2. Anticoagulation/DVT prophylaxis with Pharmaceutical: Coumdin .  Continue aspirin and Lovenox  till INR therapeutic.  3. Pain Management: Monitor for now for OA symptoms.  Patient Active Hospital Problem List: DIABETES MELLITUS, TYPE II (01/03/2007)   Assessment: blood sugars 12-150 range   Plan: Amaryl added for control. Monitor over next few days. HYPERLIPIDEMIA (01/03/2007)   Assessment:    Plan: continue zocor DEPRESSION (01/03/2007)   Assessment: flat and seems disinterested.Post CVA depression ritalin trial   Plan: Started on celexa.  Ego support by team.   NEUROPATHY, IDIOPATHIC PERIPHERAL NOS (01/07/2007)   Assessment: Pain BLE especially with increased activity per patient   Plan: Continue neurontin but increase to QID ANEMIA (07/07/2010)   Assessment: ?etiology.    Plan: Anemia panel with low iron stores.  Continue iron supplement.  Will monitor H and H for now.  Check stool guaiacs.   HYPERTENSION (07/07/2010)   Assessment:  systolic's resonalble.  Monitor for trends.   Plan: Continue losartan, metoprolol, furosemide Cardiomyopathy (04/25/2011)    POA: Unknown   Assessment: EF @ 20-25%.  Likely contributing to SOB with activity.   Plan: Monitor for symptoms.  Monitor for signs of overload.  Will add low salt and CM restrictions. Dysphagia:  On DI diet due to pocketing and impulsivity. D/C ASA   Kween Bacorn E 05/05/2011, 7:01 AM

## 2011-05-05 NOTE — Progress Notes (Signed)
PT = 30.6, INR 2.88 (down from 3.19)  A: CVA. INR rose slightly > goal 2-3 but now declining and back in goal range.  Pt has one episode of severe HTN yesterday 156/100 (losartan, lopressor, lasix)  P: Give Coumadin 2.5mg  again today x 1

## 2011-05-05 NOTE — Progress Notes (Signed)
Patient, pleasant and participates in therapies. No complains of pain this shift. LUE continues to be flaccid and requires 2 people to transfer patient. Therapy reports patient having a BM and void while doing her bath this morning on the [facility]. No unsafe issues notes, continue with plan of care. Reagen Goates, Dione Plover

## 2011-05-05 NOTE — Progress Notes (Signed)
Physical Therapy Session Note  Patient Details  Name: [name] MRN: [id number] Date of Birth: 1960-07-05  Today's Date: 05/05/2011 Time: [id number] Time Calculation (min): 55 min  Precautions: Precautions Precautions: Fall Precaution Comments: obesity, severe left neglect Required Braces or Orthoses:  (Left PRAFO in bed) Restrictions Weight Bearing Restrictions: No Other Position/Activity Restrictions: diplopia with patch wearing schedule, homonimous hemianopsia, left inattention, left hemiplegia, apraxia, absent left sided sensation, decreased sustained attention  Short Term Goals: PT Short Term Goal 1: Pt will roll left with max assist, roll right with mod assist. PT Short Term Goal 2: Pt will supine-sit with max assist. PT Short Term Goal 3: Pt will transfer with max assist. PT Short Term Goal 4: W/C mobility x 50 feet mod assist in controlled environment. PT Short Term Goal 5: Tolerate standing > 3 min with mechanical lift vs. total assist +2 to initiate weight bearing and pregait activities.  Skilled Therapeutic Interventions/Progress Updates:  Focus of treatment: Gait training with lift to facilitate weight shifts to uninvolved LE/ increase weight bearing LT LE to facilitate knee extension     General Pt up in wc      Pain  No complaint of pain    Locomotion Gait training: 15 feet X 2 using Lite Gait total assist X 2 . Pt requires assist of one to weight shift to RT to unweight involved lower extremity and additional assist to advance Left Lower extremity.        Other: Stepping forward/backward Rt Lower extremity using light gait assist and assist to attain left knee extension in stance to facilitate quad contraction.           Therapy/Group: Individual Therapy  Hezzie Karim,JIM 05/05/2011, 1:59 PM

## 2011-05-05 NOTE — Progress Notes (Signed)
Speech Language Pathology Progress Notes  Speech Language Pathology Progress Note    Short Term Goals from 04/27/11:  Patient will:  1. Demonstrate problem solving with moderate assist semantic cues with BADLs. NOT MET 2. Demonstrate sustained attention to BADL for 5-8 minutes with moderate semantic cues. MET 3. Label 1 physical and 1 cognitive deficit with minimal assist semantic cues. MET 4. Verbally express needs/wants with minimal assist sematic cues to increase vocal intensity. MET  5. Patient will consume recommended diet without observed clinical signs of aspiration with: Minimal assistance; minimal cueing MET 6. Patient will recall and utilize recommended strategies during swallow to increase swallowing safety with: Minimal assistance; minimal cueing NOT MET  Skilled Therapeutic Interventions/Progress Updates: Pt has met 4/6 of her short-term goals.  She continues to require max assist overall for basic problem-solving.  Verbal problem-solving marginally better than functional; though solutions to verbal situations are produced impulsively and without consideration of consequences. Impulsivity persists. Attention has demonstrated improvements with emerging abilities to alternatively attend to competing stimuli while maintaining task set.  Continues to require max assist to self-monitor and correct.  Pt participated in card-writing task today for her niece's birthday, requiring max assist for letter/image construction on paper (baseline aphasic component also impacts letter retrieval for writing).  Showed minimal recognition of errors.  Improved motor/sensory function for swallowing with diet upgrade this week to Dysphagia 2/thin liquids and improved toleration; however, continues to require mod assist to utilize safety strategies when eating.       Therapy Start Time/End Time: Time Calculation Start Time: [id number] Stop Time: 1200 Time Calculation (min): 90 min   Pain: no  pain  Therapy/Group: Individual Therapy   [name] 05/05/2011 1:22 PM

## 2011-05-05 NOTE — Progress Notes (Signed)
Physical Therapy Session Note  Patient Details  Name: [name] MRN: [id number] Date of Birth: 1961/03/18  Today's Date: 05/05/2011 Time: 0900-0945 Time Calculation (min): 45 min  Precautions: Precautions Precautions: Fall Precaution Comments: obesity, severe left neglect Required Braces or Orthoses:  (Left PRAFO in bed) Restrictions Weight Bearing Restrictions: No Other Position/Activity Restrictions: diplopia with patch wearing schedule, homonimous hemianopsia, left inattention, left hemiplegia, apraxia, absent left sided sensation, decreased sustained attention  Short Term Goals: PT Short Term Goal 1: Pt will roll left with max assist, roll right with mod assist. PT Short Term Goal 2: Pt will supine-sit with max assist. PT Short Term Goal 3: Pt will transfer with max assist. PT Short Term Goal 4: W/C mobility x 50 feet mod assist in controlled environment. PT Short Term Goal 5: Tolerate standing > 3 min with mechanical lift vs. total assist +2 to initiate weight bearing and pregait activities.  Skilled Therapeutic Interventions/Progress Updates: Focus of treatment: Gait training with Lite Gait + 2 assist to facilitate weight shift to RT; knee extension control in stance of Lt and advancing Lt LE during swing          Pain  No c/o pain    Locomotion Pt ambulated 22 feet and 20 feet using Lite Gait +2 assist to weight shift to RT to allow swing on Lt. Pt could initiate swing on Lt when unweighted but required assist to completely advance Lt with partial knee extension in stance. Sit to stand mod assist. Ambulation Ambulation/Gait Assistance: 1: +2 Total assist               Therapy/Group: Individual Therapy  [name],JIM 05/05/2011, 9:43 AM

## 2011-05-06 LAB — GLUCOSE, CAPILLARY
Glucose-Capillary: 99 mg/dL (ref 70–99)
Glucose-Capillary: 96 mg/dL (ref 70–99)

## 2011-05-06 LAB — PROTIME-INR: Prothrombin Time: 29.2 seconds — ABNORMAL HIGH (ref 11.6–15.2)

## 2011-05-06 MED ORDER — WARFARIN SODIUM 2.5 MG PO TABS
2.5000 mg | ORAL_TABLET | Freq: Every day | ORAL | Status: DC
  Administered 2011-05-06: 2.5 mg via ORAL
  Filled 2011-05-06 (×2): qty 1

## 2011-05-06 NOTE — Progress Notes (Signed)
Patient ID: [name], female   DOB: 10-14-1960, 50 y.o.   MRN: [id number] Subjective/Complaints: No new complaints, doesn't know how she feels yet.  Comfortable night.  Objective: Vital Signs: Blood pressure 138/90, pulse 78, temperature 97.9 F (36.6 C), temperature source Oral, resp. rate 19, height 5\' 7"  (1.702 m), weight 124.8 kg (275 lb 2.2 oz), SpO2 98.00%. No results found. No results found for this basename: WBC:2,HGB:2,HCT:2,PLT:2 in the last 72 hours CBG (last 3)   Basename 05/06/11 0720 05/05/11 2108 05/05/11 1621  GLUCAP 88 92 80   Results for orders placed during the hospital encounter of 04/26/11 (from the past 24 hour(s))  GLUCOSE, CAPILLARY     Status: Abnormal   Collection Time   05/05/11 11:42 AM      Component Value Range   Glucose-Capillary 125 (*) 70 - 99 (mg/dL)   Comment 1 Notify RN    GLUCOSE, CAPILLARY     Status: Normal   Collection Time   05/05/11  4:21 PM      Component Value Range   Glucose-Capillary 80  70 - 99 (mg/dL)   Comment 1 Notify RN    GLUCOSE, CAPILLARY     Status: Normal   Collection Time   05/05/11  9:08 PM      Component Value Range   Glucose-Capillary 92  70 - 99 (mg/dL)  GLUCOSE, CAPILLARY     Status: Normal   Collection Time   05/06/11  7:20 AM      Component Value Range   Glucose-Capillary 88  70 - 99 (mg/dL)   Comment 1 Notify RN      No results found for this basename: NA:2,K:2,CL:2,CO2:2,GLUCOSE:2,BUN:2,CREATININE:2,CALCIUM:2 in the last 72 hours BP Readings from Last 3 Encounters:  05/06/11 138/90  04/26/11 132/85  04/26/11 132/85   INR  Date Value Range Status  05/05/2011 2.88* 0.00-1.49 (no units) Final      Physical Exam: General appearance: morbidly obese, flat affect. Resp: clear to auscultation bilaterally Cardio: regular rate and rhythm GI: soft, non-tender; bowel sounds normal; no masses,  no organomegaly Extremities: no edema, redness or tenderness in the calves or thighs Pulses: 2+ and  symmetric Skin: Skin color, texture, turgor normal. No rashes or lesions Neurologic: Gait: working on pregait activities. Alert and oriented X 3, flat affect, left inattention, left hemiparesis UE(0/5)>LE except 2- L triceps.  Left hemisensory deficits plus insensate feet bilateral .  Poor awareness . Assessment/Plan: 1. Functional deficits secondary to  Right Cardioembolic CVA  which require 3+ hours per day of interdisciplinary therapy in a comprehensive inpatient rehab setting. Physiatrist is providing close team supervision and 24 hour management of active medical problems listed below. Physiatrist and rehab team continue to assess barriers to discharge/monitor patient progress toward functional and medical goals. 2.  Diabetes- stable 3.  Anemia- unchanged 4.  HTN- controlled Mobility: Bed Mobility Bed Mobility: Yes Rolling Right: 2: Max assist Rolling Left: 3: Mod assist;With rail Rolling Left Details (indicate cue type and reason): Assist to flex left leg, and cues to maintain flexion in right leg.  Utilized bed rail with tactile cue to locate Supine to Sit: 1: +1 Total assist;HOB flat Sit to Supine - Left: 1: +1 Total assist Transfers Transfers: Yes Sit to Stand: 1: +2 Total assist Stand to Sit: 1: +2 Total assist Lateral/Scoot Transfers: 2: Max assist;Other (comment) (with slideboard) Lateral/Scoot Transfer Details (indicate cue type and reason): Second person needed to stabilize equipment during transfer,  Slide board transfer to right  Ambulation/Gait Ambulation/Gait Assistance: 1: +2 Total assist Stairs: No (unsafe to attempt at this time, unable to achive standing) Wheelchair Mobility Wheelchair Mobility: Yes Distance:  (58ft) ADL:   Cognition: Cognition Overall Cognitive Status: Impaired Arousal/Alertness: Awake/alert Orientation Level: Disoriented to situation;Disoriented to time Attention: Sustained Sustained Attention: Impaired Sustained Attention Impairment:  Functional basic Memory: Impaired Memory Impairment: Retrieval deficit Awareness: Impaired Awareness Impairment: Intellectual impairment Problem Solving: Impaired Problem Solving Impairment: Functional basic;Verbal basic Behaviors: Perseveration;Other (comment) (flat affect) Safety/Judgment: Impaired Comments: Moves very quickly at times, impulsive Cognition Arousal/Alertness: Awake/alert Orientation Level: Disoriented to situation;Disoriented to time  2. Anticoagulation/DVT prophylaxis with Pharmaceutical: Coumdin .  Continue aspirin and Lovenox  till INR therapeutic.  3. Pain Management: Monitor for now for OA symptoms.  Patient Active Hospital Problem List: DIABETES MELLITUS, TYPE II (01/03/2007)   Assessment: blood sugars 12-150 range   Plan: Amaryl added for control. Monitor over next few days. HYPERLIPIDEMIA (01/03/2007)   Assessment:    Plan: continue zocor DEPRESSION (01/03/2007)   Assessment: flat and seems disinterested.Post CVA depression ritalin trial   Plan: Started on celexa.  Ego support by team.   NEUROPATHY, IDIOPATHIC PERIPHERAL NOS (01/07/2007)   Assessment: Pain BLE especially with increased activity per patient   Plan: Continue neurontin but increase to QID ANEMIA (07/07/2010)   Assessment: ?etiology.    Plan: Anemia panel with low iron stores.  Continue iron supplement.  Will monitor H and H for now.  Check stool guaiacs.   HYPERTENSION (07/07/2010)   Assessment:  systolic's resonalble.  Monitor for trends.   Plan: Continue losartan, metoprolol, furosemide Cardiomyopathy (04/25/2011)    POA: Unknown   Assessment: EF @ 20-25%.  Likely contributing to SOB with activity.   Plan: Monitor for symptoms.  Monitor for signs of overload.  Will add low salt and CM restrictions. Dysphagia:  On DI diet due to pocketing and impulsivity. D/C ASA   Alysia Penna E 05/06/2011, 7:33 AM

## 2011-05-06 NOTE — Progress Notes (Signed)
Alert and oriented no complaint of pain. Participates in therapies. No unsafe behaviors noted.Dressing changed to lt great toe.Blood sugars within normal limits. [name] lift 2 person assist .

## 2011-05-06 NOTE — Progress Notes (Signed)
[name] Lift to transfer

## 2011-05-06 NOTE — Progress Notes (Signed)
PT = 29.2, INR 2.71  A: CVA. INR rose slightly > goal 2-3 but now declining and back in goal range.  Pt has one episode of severe HTN yesterday 150/88 (losartan, lopressor, lasix)  P: Give Coumadin 2.5mg  po daily for maintenance.

## 2011-05-06 NOTE — Progress Notes (Signed)
Occupational Therapy Note  Patient Details  Name: [name] MRN: [id number] Date of Birth: [birth year] Today's Date: 05/06/2011  Time in/out: 8:30-9:25 Total minutes: 55   Pain: 0/10; no pain reported  Maryan Puls 05/06/2011, 12:17 PM   Skilled intervention: ADL-retraining, at w/c level sink side, with emphasis on bed mobility, transfers using sliding board, safe and effective use of DME, attention to and management of left side (UE and LE), sequencing and motor planning, and dynamic sitting balance.   Patient able to request assistance from OT, as needed, in preparation for bed <> w/c transfers using sliding board.   Patient was advised on use of environment controls (hospital bed) but operational status of equipment appeared inadequate this date.   OT provided blocking assist and verbal cues with stabilization of left hip/knees during sliding board transfers; patient demo'd no loss of balance or instability at trunk during transfer.   Patient able to wash upper and lower body with verbal cues to progress and assist with washing of right UE, buttocks (done in bed), and feet (deferred this date d/t time limitations).  INDIVIDUAL SESSION

## 2011-05-06 NOTE — Progress Notes (Signed)
Applied Prafo boot to right leg

## 2011-05-07 DIAGNOSIS — G8110 Spastic hemiplegia affecting unspecified side: Secondary | ICD-10-CM

## 2011-05-07 DIAGNOSIS — I6330 Cerebral infarction due to thrombosis of unspecified cerebral artery: Secondary | ICD-10-CM

## 2011-05-07 LAB — GLUCOSE, CAPILLARY
Glucose-Capillary: 80 mg/dL (ref 70–99)
Glucose-Capillary: 105 mg/dL — ABNORMAL HIGH (ref 70–99)

## 2011-05-07 LAB — PROTIME-INR: Prothrombin Time: 26.8 seconds — ABNORMAL HIGH (ref 11.6–15.2)

## 2011-05-07 LAB — OCCULT BLOOD X 1 CARD TO LAB, STOOL: Fecal Occult Bld: NEGATIVE

## 2011-05-07 MED ORDER — WARFARIN SODIUM 5 MG PO TABS
5.0000 mg | ORAL_TABLET | Freq: Once | ORAL | Status: AC
  Administered 2011-05-07: 5 mg via ORAL
  Filled 2011-05-07: qty 1

## 2011-05-07 MED ORDER — METHYLPHENIDATE HCL 5 MG PO TABS
10.0000 mg | ORAL_TABLET | Freq: Two times a day (BID) | ORAL | Status: DC
  Administered 2011-05-07 – 2011-05-16 (×20): 10 mg via ORAL
  Filled 2011-05-07 (×20): qty 2

## 2011-05-07 NOTE — Progress Notes (Signed)
Occupational Therapy Session Note  Patient Details  Name: [name] MRN: [id number] Date of Birth: 22-May-1961  Today's Date: 05/07/2011 Time: [id number] Time Calculation (min): 28 min  Precautions: Precautions Precautions: Fall Precaution Comments: obesity, severe left neglect Required Braces or Orthoses:  (Left PRAFO in bed) Restrictions Weight Bearing Restrictions: No Other Position/Activity Restrictions: diplopia with patch wearing schedule, homonimous hemianopsia, left inattention, left hemiplegia, apraxia, absent left sided sensation, decreased sustained attention  Short Term Goals: OT Short Term Goal 1: Pt will complete bathing with mod assist OT Short Term Goal 1 - Progress: Progressing toward goal OT Short Term Goal 2: Pt will complete UB dressing with mod assist OT Short Term Goal 2 - Progress: Progressing toward goal OT Short Term Goal 3: Pt will complete toilet transfer with max assist using squat pivot technique OT Short Term Goal 3 - Progress: Revised (modified due to lack of progress/goal met) OT Short Term Goal 4: Pt will complete grooming in midline seated position at sink with min cues to scan to obtain items OT Short Term Goal 4 - Progress: Met  Skilled Therapeutic Interventions/Progress Updates:    Session focused on sit to stand and static standing balance.  Pt encouraged to use RLE to assist with moving LLE to complete bed mobility to sitting EOB.  Three musketeer technique for sit to stand with pt completing approx 60% of task and initiating movement and weight bearing through LLE.  Pt incontinent, so focus on sit to stand to clean periarea with +2 to keep upright standing while other person cleaning bottom.  Weight shifting with min cues for pt to shift weight to Rt side and to stick out chest to increase standing balance.  Stand pivot transfer to w/c per request of pt with mod assist and max verbal cues to take steps and to initiate weight shifting.  Pain   No  c/o pain this session.  Therapy/Group: Individual Therapy  [name] 05/07/2011, 3:04 PM

## 2011-05-07 NOTE — Progress Notes (Signed)
PT = 26.8 sec, INR = 2.43  42 YOF with recent stroke, INR is therapeutic with coumadin 2.5mg  daily last 3 days.   Plan 1.  INR trending down, suspect needs higher dose tonight, will give 5mg  x1 2. Check CBC in am  Doreene Eland, PharmD 05/07/2011 13:45

## 2011-05-07 NOTE — Progress Notes (Signed)
Social Work Patient ID: [name], female   DOB: 1960/07/05, 50 y.o.   MRN: [id number]   Met with pt and sister who was here to observe pt in therapies. Sister wants to take pt home and provide care. She is not sure whether she will be able to provide the care pt will require at discharge.  They are aware of the alternative Of short term NHP.  Will continue to monitor and check in with sister.  Pt reports her progress is slow, but she is hanging In there.

## 2011-05-07 NOTE — Progress Notes (Signed)
Pt. Having trouble swallowing pills whole in pudding, pockets on the right and observed a pill in between her teeth. Requiring multi attempts to get meds swallowed. Drools from mouth.  Incontinent of small stool tonight. No initiation to call when incontinent. Timed toilieting unsuccessful at hs. Wears eye patch at times, alternating eyes every 2 hours during the day. Dressing to left great toe changed per orders. Sister at bedside and staying the night. [name]

## 2011-05-07 NOTE — Progress Notes (Signed)
Pt assisted to floor during attempted transfer from wc to bed. Pt body went limp and staff unable to hold her up. No injuries. 2 NA staff with patient assisting. Algis Liming, PA on unit and notified of incident. Sister at bedside notified.

## 2011-05-07 NOTE — Progress Notes (Signed)
Occupational Therapy Session Note  Patient Details  Name: [name] MRN: [id number] Date of Birth: [birth year]  Today's Date: 05/07/2011 Time: 1002-1056 Time Calculation (min): 54 min  Precautions: Precautions Precautions: Fall Precaution Comments: obesity, severe left neglect Required Braces or Orthoses:  (Left PRAFO in bed) Restrictions Weight Bearing Restrictions: No Other Position/Activity Restrictions: diplopia with patch wearing schedule, homonimous hemianopsia, left inattention, left hemiplegia, apraxia, absent left sided sensation, decreased sustained attention  Short Term Goals: OT Short Term Goal 1: Pt will complete bathing with mod assist OT Short Term Goal 1 - Progress: Progressing toward goal OT Short Term Goal 2: Pt will complete UB dressing with mod assist OT Short Term Goal 2 - Progress: Progressing toward goal OT Short Term Goal 3: Pt will complete toilet transfer with max assist using squat pivot technique OT Short Term Goal 3 - Progress: Revised (modified due to lack of progress/goal met) OT Short Term Goal 4: Pt will complete grooming in midline seated position at sink with min cues to scan to obtain items OT Short Term Goal 4 - Progress: Met  Skilled Therapeutic Interventions/Progress Updates:    Pt seen for ADL retraining sitting at EOB.  Focus of session placed on bed mobility with increased Lt body attention to move LLE off side of bed and set up of LUE and use of bedrails to get to sitting EOB.  Pt demonstrated increased unsupported sitting balance, increased sit to stand still with +2 assist, and increased attention to task.  Completed bathing and dsg at EOB with +2 assist to complete pericare and don brief.  Verbal and tactile cues for pt to weight shift towards Rt with "hip bump".  Grooming conducted at sink seated in w/c with min verbal cues to initiate grooming task.  Constant cues to attend to LUE and encouraged incorporation in all tasks through weight  bearing or drawing attention to it.  Pain   Pt reports no pain. ADL ADL Grooming: Supervision/safety;Setup Where Assessed-Grooming: Sitting at sink;Edge of bed;Wheelchair Upper Body Bathing: Moderate assistance Where Assessed-Upper Body Bathing: Edge of bed Lower Body Bathing: Dependent (+2 assist for pericare) Where Assessed-Lower Body Bathing: Edge of bed (sit to stand) Upper Body Dressing: Moderate assistance Where Assessed-Upper Body Dressing: Edge of bed Lower Body Dressing: Dependent Where Assessed-Lower Body Dressing: Edge of bed ADL Comments: Improved postural control, improved dynamic sitting balance, improved sit to stand and static stand tolerance  Therapy/Group: Individual Therapy  [name] 05/07/2011, 11:01 AM

## 2011-05-07 NOTE — Progress Notes (Signed)
Physical Therapy Session Note  Patient Details  Name: Norris Hell MRN: [id number] Date of Birth: 1960-07-20  Today's Date: 05/07/2011 Time: 1130-1200 Time Calculation (min): 30 min  Precautions: Precautions Precautions: Fall Precaution Comments: obesity, severe left neglect Required Braces or Orthoses:  (Left PRAFO in bed) Restrictions Weight Bearing Restrictions: No Other Position/Activity Restrictions: diplopia with patch wearing schedule, homonimous hemianopsia, left inattention, left hemiplegia, apraxia, absent left sided sensation, decreased sustained attention  Short Term Goals: PT Short Term Goal 1: Pt will roll left with max assist, roll right with mod assist. PT Short Term Goal 2: Pt will supine-sit with max assist. PT Short Term Goal 3: Pt will transfer with max assist. PT Short Term Goal 4: W/C mobility x 50 feet mod assist in controlled environment. PT Short Term Goal 5: Tolerate standing > 3 min with mechanical lift vs. total assist +2 to initiate weight bearing and pregait activities.   Pain: no c/o pain during session   Mobility Bed Mobility Bed Mobility: No Transfers Transfers: Yes Sit to Stand: 1: +2 Total assist;From elevated surface;With upper extremity assist Sit to Stand Details: Tactile cues for initiation;Tactile cues for sequencing;Manual facilitation for weight shifting;Manual facilitation for weight bearing;Verbal cues for sequencing;Verbal cues for technique;Tactile cues for weight shifting;Tactile cues for weight beaing;Tactile cues for posture Sit to Stand Details (indicate cue type and reason): Use of maxisky for safety and trunk support and UE support around therapist and then up on tall table with +2 A with cues for upright posture and gaze during sit to stand and static standing during lateral weight shifting to LLE with use of mirror as visual feedback and manual cues through LLE for activation of hip and knee extensors for sit to stand and for  stability during static stand  and manual cues for L trunk elongation Stand to Sit: 1: +2 Total assist;To elevated surface Stand to Sit Details (indicate cue type and reason): Verbal cues for sequencing Stand to Sit Details: cues for attention to L UE and for anterior lean and controlled lowering to seat Lateral/Scoot Transfers: 3: Mod assist Lateral/Scoot Transfer Details: Tactile cues for initiation;Tactile cues for sequencing;Tactile cues for weight shifting;Visual cues for safe use of DME/AE;Visual cues/gestures for precautions/safety;Visual cues/gestures for sequencing;Verbal cues for sequencing;Verbal cues for precautions/safety;Verbal cues for safe use of DME/AE;Manual facilitation for weight shifting Lateral/Scoot Transfer Details (indicate cue type and reason): scoot to R side w/c <> mat; cues needed for lateral weight shifts for slideboard placement and anterior lean to unweight hips  Therapy/Group: Co-Treatment with PTA (60 minutes total)  Raylene Everts [place] 05/07/2011, 1:27 PM

## 2011-05-07 NOTE — Progress Notes (Signed)
Speech Language Pathology Speech Language Pathology Session Note   Skilled Therapeutic Interventions: Treatment focus on sustained attention and functional problem solving. Sorting task with card game with Mod assist question cues to match pictures and self-monitor and correct errors. 4-step sequencing task with picture cards. Pt with Max A verbal, visual and question cues to complete task. Unable to determine how much vision deficits impact task. Pt reports vision is "better." Verbalized swallowing strategies with Mod I and reported tolerating upgraded diet well.   Therapy Start Time/End Time: Time Calculation Start Time: [id number] Stop Time: 1530 Time Calculation (min): 45 min  Pain:No/Denies Pain  FIM: Comprehension Comprehension Mode: Auditory Comprehension: 3-Understands basic 50 - 74% of the time/requires cueing 25 - 50%  of the time Expression Expression Mode: Verbal Expression: 3-Expresses basic 50 - 74% of the time/requires cueing 25 - 50% of the time. Needs to repeat parts of sentences. Social Interaction Social Interaction Mode: Asleep Social Interaction: 3-Interacts appropriately 50 - 74% of the time - May be physically or verbally inappropriate. Problem Solving Problem Solving Mode: Asleep Problem Solving: 2-Solves basic 25 - 49% of the time - needs direction more than half the time to initiate, plan or complete simple activities Memory Memory Mode: Asleep Memory: 2-Recognizes or recalls 25 - 49% of the time/requires cueing 51 - 75% of the time   Therapy/Group: Individual Therapy     Zena Vitelli, [place] 05/07/2011 3:34 PM

## 2011-05-07 NOTE — Progress Notes (Signed)
Left great toe cleaned and dressed with bactroban applied. Toe nail almost gone. No stool today. Continent of urine. Pt alert and oriented, pleasant. Conversing appropriately. No c/o pain. Good appetite. Slide board used to transfer wc to bed. Pt assists weakly. Requires frequent cueing. Family at bedside. Participates with therapy. Continue plan of care.

## 2011-05-07 NOTE — Progress Notes (Signed)
[facility]  Daily Progress Note  Assessment/Planning: BLE PAD   L great toe looks less ischemic today: no bone exposed on exam today  Still awaiting DBI and toe pressures  Subjective   No complaints  Objective Filed Vitals:   05/06/11 0500 05/06/11 1450 05/06/11 2051 05/07/11 0503  BP: 138/90 128/85 150/92 138/78  Pulse: 78 89 94 95  Temp: 97.9 F (36.6 C) 97.3 F (36.3 C)  99 F (37.2 C)  TempSrc: Oral Oral  Oral  Resp: 19 16  16   Height:      Weight:      SpO2: 98%   96%    Intake/Output Summary (Last 24 hours) at 05/07/11 0800 Last data filed at 05/06/11 2100  Gross per 24 hour  Intake    960 ml  Output      1 ml  Net    959 ml   VASC  L calf warm, left foot warm, no palpable pulses, R great toe probed: no frank bone, previously bone-like protrusion is hardened callus, L great toe looks less ischemic today  Laboratory CBC    Component Value Date/Time   WBC 7.6 04/27/2011 0700   HGB 8.8* 04/27/2011 0700   HCT 28.6* 04/27/2011 0700   PLT 466* 04/27/2011 0700    BMET    Component Value Date/Time   NA 137 04/27/2011 0700   K 4.6 04/27/2011 0700   CL 108 04/27/2011 0700   CO2 22 04/27/2011 0700   GLUCOSE 146* 04/27/2011 0700   BUN 34* 04/27/2011 0700   CREATININE 1.44* 04/27/2011 0700   CALCIUM 8.5 04/27/2011 0700   GFRNONAA 42* 04/27/2011 0700   GFRAA 49* 04/27/2011 0700    Adele Barthel, MD [facility]: [phone] Pager: [phone]  05/07/2011, 8:00 AM

## 2011-05-07 NOTE — Progress Notes (Signed)
Physical Therapy Session Note  Patient Details  Name: [name] MRN: [id number] Date of Birth: [birth year]  Today's Date: 05/07/2011 Time: 1100-1130 Time Calculation (min): 30 min  Precautions: Precautions Precautions: Fall Precaution Comments: obesity, severe left neglect Required Braces or Orthoses:  (Left PRAFO in bed) Restrictions Weight Bearing Restrictions: No Other Position/Activity Restrictions: diplopia with patch wearing schedule, homonimous hemianopsia, left inattention, left hemiplegia, apraxia, absent left sided sensation, decreased sustained attention  Short Term Goals: PT Short Term Goal 1: Pt will roll left with max assist, roll right with mod assist. PT Short Term Goal 2: Pt will supine-sit with max assist. PT Short Term Goal 3: Pt will transfer with max assist. PT Short Term Goal 4: W/C mobility x 50 feet mod assist in controlled environment. PT Short Term Goal 5: Tolerate standing > 3 min with mechanical lift vs. total assist +2 to initiate weight bearing and pregait activities.  Skilled Therapeutic Interventions/Progress Updates: Focus of treatment: Therapeutic activities to facilitate weight shifts/Left knee and hip control in stance using MaxiSky; Gait using Maxisky and Harmon Pier walker to facilitate weight bearing and to initiate swing LLE  General Pt up in wc  Pain No c/o pain   Mobility  Sit to stand +2 max assist from raised mat with minimal forward lean    Locomotion Gait 30 feet X 4 with MaxiSky lift and Eva walker for support + ace wrap Left ankle; Pt required assist of one to advance left LE during swing with limited weight shift to the uninvolved side and second person to assist with weight shift. In standing , pt continues to need max vcs to stand erect and stands with flexed Left knee and posterior rotated hip and trunk.  During gait , pt.leans heavily on AD.   Therapy/Group: Co-Treatment  (please see other PT note dated 05/07/11 for other half of  session per Raylene Everts, PT)  HOUT,JIM 05/07/2011, 12:33 PM

## 2011-05-07 NOTE — Progress Notes (Signed)
Patient ID: [name], female   DOB: 1961-01-10, 50 y.o.   MRN: [id number] Patient ID: [name], female   DOB: Sep 08, 1960, 50 y.o.   MRN: [id number] Subjective/Complaints: No new complaints, doesn't know how she feels yet.  Comfortable night.  Objective: Vital Signs: Blood pressure 138/78, pulse 95, temperature 99 F (37.2 C), temperature source Oral, resp. rate 16, height 5\' 7"  (1.702 m), weight 124.8 kg (275 lb 2.2 oz), SpO2 96.00%. No results found. No results found for this basename: WBC:2,HGB:2,HCT:2,PLT:2 in the last 72 hours CBG (last 3)   Basename 05/06/11 2050 05/06/11 1635 05/06/11 1136  GLUCAP 104* 88 96   Results for orders placed during the hospital encounter of 04/26/11 (from the past 24 hour(s))  GLUCOSE, CAPILLARY     Status: Normal   Collection Time   05/06/11  7:20 AM      Component Value Range   Glucose-Capillary 88  70 - 99 (mg/dL)   Comment 1 Notify RN    GLUCOSE, CAPILLARY     Status: Normal   Collection Time   05/06/11 11:36 AM      Component Value Range   Glucose-Capillary 96  70 - 99 (mg/dL)   Comment 1 Notify RN    GLUCOSE, CAPILLARY     Status: Normal   Collection Time   05/06/11  4:35 PM      Component Value Range   Glucose-Capillary 88  70 - 99 (mg/dL)   Comment 1 Notify RN    GLUCOSE, CAPILLARY     Status: Abnormal   Collection Time   05/06/11  8:50 PM      Component Value Range   Glucose-Capillary 104 (*) 70 - 99 (mg/dL)    No results found for this basename: NA:2,K:2,CL:2,CO2:2,GLUCOSE:2,BUN:2,CREATININE:2,CALCIUM:2 in the last 72 hours BP Readings from Last 3 Encounters:  05/07/11 138/78  04/26/11 132/85  04/26/11 132/85   INR  Date Value Range Status  05/06/2011 2.71* 0.00-1.49 (no units) Final      Physical Exam: General appearance: morbidly obese, flat affect. Resp: clear to auscultation bilaterally Cardio: regular rate and rhythm GI: soft, non-tender; bowel sounds normal; no masses,  no organomegaly Extremities: no  edema, redness or tenderness in the calves or thighs Pulses: 2+ and symmetric Skin: Skin color, texture, turgor normal. No rashes or lesions Neurologic: Gait: working on pregait activities. Alert and oriented X 3, flat affect, left inattention, left hemiparesis UE(0/5)>LE except 2- L triceps.  Left hemisensory deficits plus insensate feet bilateral .  Poor awareness . Assessment/Plan: 1. Functional deficits secondary to  Right Cardioembolic CVA  which require 3+ hours per day of interdisciplinary therapy in a comprehensive inpatient rehab setting. Physiatrist is providing close team supervision and 24 hour management of active medical problems listed below. Physiatrist and rehab team continue to assess barriers to discharge/monitor patient progress toward functional and medical goals. 2.  Diabetes- stable 3.  Anemia- unchanged 4.  HTN- controlled Mobility: Bed Mobility Bed Mobility: Yes Rolling Right: 2: Max assist Rolling Left: 3: Mod assist;With rail Rolling Left Details (indicate cue type and reason): Assist to flex left leg, and cues to maintain flexion in right leg.  Utilized bed rail with tactile cue to locate Supine to Sit: 1: +1 Total assist;HOB flat Sit to Supine - Left: 1: +1 Total assist Transfers Transfers: Yes Sit to Stand: 1: +2 Total assist Stand to Sit: 1: +2 Total assist Lateral/Scoot Transfers: 2: Max assist;Other (comment) (with slideboard) Lateral/Scoot Transfer Details (indicate cue type and  reason): Second person needed to stabilize equipment during transfer,  Slide board transfer to right Ambulation/Gait Ambulation/Gait Assistance: 1: +2 Total assist Stairs: No (unsafe to attempt at this time, unable to achive standing) Wheelchair Mobility Wheelchair Mobility: Yes Distance:  (2ft) ADL:   Cognition: Cognition Overall Cognitive Status: Impaired Arousal/Alertness: Awake/alert Orientation Level: Oriented to person;Oriented to place Attention:  Sustained Sustained Attention: Impaired Sustained Attention Impairment: Functional basic Memory: Impaired Memory Impairment: Retrieval deficit Awareness: Impaired Awareness Impairment: Intellectual impairment Problem Solving: Impaired Problem Solving Impairment: Functional basic;Verbal basic Behaviors: Perseveration;Other (comment) (flat affect) Safety/Judgment: Impaired Comments: Moves very quickly at times, impulsive Cognition Arousal/Alertness: Awake/alert Orientation Level: Oriented to person;Oriented to place  2. Anticoagulation/DVT prophylaxis with Pharmaceutical: Coumdin .  Continue aspirin and Lovenox  till INR therapeutic.  3. Pain Management: Monitor for now for OA symptoms.  Patient Active Hospital Problem List: DIABETES MELLITUS, TYPE II (01/03/2007)   Assessment: blood sugars 12-150 range   Plan: Amaryl added for control. Monitor over next few days. HYPERLIPIDEMIA (01/03/2007)   Assessment:    Plan: continue zocor DEPRESSION (01/03/2007)   Assessment: flat and seems disinterested.Post CVA depression ritalin trial   Plan: Started on celexa.  Ego support by team.   NEUROPATHY, IDIOPATHIC PERIPHERAL NOS (01/07/2007)   Assessment: Pain BLE especially with increased activity per patient   Plan: Continue neurontin but increase to QID ANEMIA (07/07/2010)   Assessment: ?etiology.    Plan: Anemia panel with low iron stores.  Continue iron supplement.  Will monitor H and H for now.  Check stool guaiacs.   HYPERTENSION (07/07/2010)   Assessment:  systolic's resonalble.  Monitor for trends.   Plan: Continue losartan, metoprolol, furosemide Cardiomyopathy (04/25/2011)    POA: Unknown   Assessment: EF @ 20-25%.  Likely contributing to SOB with activity.   Plan: Monitor for symptoms.  Monitor for signs of overload.  Will add low salt and CM restrictions. Dysphagia:  On DI diet due to pocketing and impulsivity. D/C ASA   Traylon Schimming E 05/07/2011, 7:13 AM

## 2011-05-08 ENCOUNTER — Encounter (HOSPITAL_COMMUNITY): Payer: Self-pay | Admitting: Internal Medicine

## 2011-05-08 LAB — CBC
HCT: 33.3 % — ABNORMAL LOW (ref 36.0–46.0)
MCV: 87.2 fL (ref 78.0–100.0)
RBC: 3.82 MIL/uL — ABNORMAL LOW (ref 3.87–5.11)
WBC: 4.3 10*3/uL (ref 4.0–10.5)

## 2011-05-08 LAB — GLUCOSE, CAPILLARY: Glucose-Capillary: 77 mg/dL (ref 70–99)

## 2011-05-08 LAB — PROTIME-INR: INR: 2.06 — ABNORMAL HIGH (ref 0.00–1.49)

## 2011-05-08 MED ORDER — WARFARIN SODIUM 5 MG PO TABS
5.0000 mg | ORAL_TABLET | Freq: Once | ORAL | Status: AC
  Administered 2011-05-08: 5 mg via ORAL
  Filled 2011-05-08: qty 1

## 2011-05-08 NOTE — Progress Notes (Signed)
Speech Language Pathology Progress Notes  Speech Language Pathology Session Note   Short Term Goals: 1. Patient will demonstrate problem solving with moderate assist semantic cues with BADLs. 2. Patient will demonstrate selective attention to BADL/task for 10-12 minutes with moderate semantic cues. 3. Patient will request help as needed with moderate semantic cues.  4. Patient will consume Dys.2 texures without observed clinical signs of aspiration with: Minimal assistance; semantic cues. 5 .Patient will recall and utilize recommended strategies during swallow to increase swallowing safety with: Minimal assistance; semantic cues.  Therapy Start Time/End Time:  [id number] 60 minutes Individual Treatment  Pain: 0  Skilled Therapeutic Intervention: Session focused on selective attention with matching given two variables; SLP facilitated with familiar card game and maximum assist semantic, visual, and tactile cues to scan to midline and slight left field with maximum assist semantic cues to attend to matching and moderate cues to attend to turn.  Mild external distractions did not appear to impact function.  Patient also consumed thin liquids via straw with no overt s/s of aspiration.    [name] 05/08/2011 11:22 AM

## 2011-05-08 NOTE — Progress Notes (Signed)
Pt had a good day today. No c/o pain. Small incontinent BM. Good appetite. Pills whole with applesauce. Participating with therapies. Continue plan of care.

## 2011-05-08 NOTE — Progress Notes (Signed)
Occupational Therapy Session Note  Patient Details  Name: [name] MRN: [id number] Date of Birth: 04/11/1961  Today's Date: 05/08/2011 Time: 1000-1056 Time Calculation (min): 56 min  Precautions: Precautions Precautions: Fall Precaution Comments: obesity, severe left neglect Required Braces or Orthoses:  (Left PRAFO in bed) Restrictions Weight Bearing Restrictions: No Other Position/Activity Restrictions: diplopia with patch wearing schedule, homonimous hemianopsia, left inattention, left hemiplegia, apraxia, absent left sided sensation, decreased sustained attention  Short Term Goals: OT Short Term Goal 1: Pt will complete bathing with mod assist OT Short Term Goal 1 - Progress: Progressing toward goal OT Short Term Goal 2: Pt will complete UB dressing with mod assist OT Short Term Goal 2 - Progress: Progressing toward goal OT Short Term Goal 3: Pt will complete toilet transfer with max assist using squat pivot technique OT Short Term Goal 3 - Progress: Revised (modified due to lack of progress/goal met) OT Short Term Goal 4: Pt will complete grooming in midline seated position at sink with min cues to scan to obtain items OT Short Term Goal 4 - Progress: Met  Skilled Therapeutic Interventions/Progress Updates:    Pt seen for ADL retraining with focus on static and dynamic sitting balance at EOB with bathing and dressing.  Pt demonstrated increased sitting balance this session with 1 LOB when leaning backwards, but maintained balance throughout session with reaching down to attempt to don socks and pull up pants.  Pt continues to require total assist +2 to complete LB bathing and dressing, but is making great improvements towards goals by increased attention, sitting and standing balance, and participation in tasks.  Neuro re-ed with vision with pt scanning to Lt to read headlines on newspaper and clean sink with scanning to Lt.  Pt compensates for field cut by turning head, max cues  to scan with eyes and keep head straight. Pain   Pt reports no pain at this time.  Therapy/Group: Individual Therapy  [name] 05/08/2011, 11:11 AM

## 2011-05-08 NOTE — Progress Notes (Signed)
Occupational Therapy Session Note  Patient Details  Name: [name] MRN: [id number] Date of Birth: [birth year]  Today's Date: 05/08/2011 Time: 1330-1400 Time Calculation (min): 30 min  Precautions: Precautions Precautions: Fall Precaution Comments: obesity, severe left neglect Required Braces or Orthoses:  (Left PRAFO in bed) Restrictions Weight Bearing Restrictions: No Other Position/Activity Restrictions: diplopia with patch wearing schedule, homonimous hemianopsia, left inattention, left hemiplegia, apraxia, absent left sided sensation, decreased sustained attention  Short Term Goals: OT Short Term Goal 1: Pt will complete bathing with mod assist OT Short Term Goal 1 - Progress: Progressing toward goal OT Short Term Goal 2: Pt will complete UB dressing with mod assist OT Short Term Goal 2 - Progress: Progressing toward goal OT Short Term Goal 3: Pt will complete toilet transfer with max assist using squat pivot technique OT Short Term Goal 3 - Progress: Revised (modified due to lack of progress/goal met) OT Short Term Goal 4: Pt will complete grooming in midline seated position at sink with min cues to scan to obtain items OT Short Term Goal 4 - Progress: Met  Skilled Therapeutic Interventions/Progress Updates:   Neuro rehab with focus on occulomotor exercises to increase saccadic eye movements to left and increase visual attention with scanning to left environment.  Participated in connect four game with max verbal cues to scan to Lt to obtain pegs and scan for patterns and constant reminders to look to the Lt of the game board.  Pt demonstrated appropriate attention to task, requiring cues only for vision.  Pain Pain Assessment Pain Assessment: No/denies pain  Therapy/Group: Individual Therapy  [name] 05/08/2011, 2:34 PM

## 2011-05-08 NOTE — Progress Notes (Signed)
Patient ID: [name], female   DOB: 1960-11-18, 50 y.o.   MRN: [id number] Subjective/Complaints: No new complaints, doesn't know how she feels yet.  Comfortable night.  Objective: Vital Signs: Blood pressure 146/98, pulse 86, temperature 98.3 F (36.8 C), temperature source Oral, resp. rate 18, height 5\' 7"  (1.702 m), weight 124.8 kg (275 lb 2.2 oz), SpO2 99.00%. No results found. No results found for this basename: WBC:2,HGB:2,HCT:2,PLT:2 in the last 72 hours CBG (last 3)   Basename 05/08/11 [id number] 05/07/11 2107 05/07/11 1632  GLUCAP 77 118* 87   Results for orders placed during the hospital encounter of 04/26/11 (from the past 24 hour(s))  GLUCOSE, CAPILLARY     Status: Abnormal   Collection Time   05/07/11 12:21 PM      Component Value Range   Glucose-Capillary 105 (*) 70 - 99 (mg/dL)   Comment 1 Notify RN    GLUCOSE, CAPILLARY     Status: Abnormal   Collection Time   05/07/11  3:59 PM      Component Value Range   Glucose-Capillary 68 (*) 70 - 99 (mg/dL)   Comment 1 Notify RN    GLUCOSE, CAPILLARY     Status: Normal   Collection Time   05/07/11  4:32 PM      Component Value Range   Glucose-Capillary 87  70 - 99 (mg/dL)   Comment 1 Notify RN    OCCULT BLOOD X 1 CARD TO LAB, STOOL     Status: Normal   Collection Time   05/07/11  6:16 PM      Component Value Range   Fecal Occult Bld NEGATIVE    GLUCOSE, CAPILLARY     Status: Abnormal   Collection Time   05/07/11  9:07 PM      Component Value Range   Glucose-Capillary 118 (*) 70 - 99 (mg/dL)   Comment 1 Notify RN    GLUCOSE, CAPILLARY     Status: Normal   Collection Time   05/08/11  7:12 AM      Component Value Range   Glucose-Capillary 77  70 - 99 (mg/dL)   Comment 1 Notify RN      No results found for this basename: NA:2,K:2,CL:2,CO2:2,GLUCOSE:2,BUN:2,CREATININE:2,CALCIUM:2 in the last 72 hours BP Readings from Last 3 Encounters:  05/08/11 146/98  04/26/11 132/85  04/26/11 132/85   INR  Date Value Range  Status  05/07/2011 2.43* 0.00-1.49 (no units) Final      Physical Exam: General appearance: morbidly obese, flat affect. Resp: clear to auscultation bilaterally Cardio: regular rate and rhythm GI: soft, non-tender; bowel sounds normal; no masses,  no organomegaly Extremities: no edema, redness or tenderness in the calves or thighs Pulses: 2+ and symmetric Skin: Skin color, texture, turgor normal. No rashes or lesions Neurologic: Gait: working on pregait activities. Alert and oriented X 3, flat affect, left inattention, left hemiparesis UE(0/5)>LE except 2- L triceps.  Left hemisensory deficits plus insensate feet bilateral .  Poor awareness . Assessment/Plan: 1. Functional deficits secondary to  Right Cardioembolic CVA  which require 3+ hours per day of interdisciplinary therapy in a comprehensive inpatient rehab setting. Physiatrist is providing close team supervision and 24 hour management of active medical problems listed below. Physiatrist and rehab team continue to assess barriers to discharge/monitor patient progress toward functional and medical goals. 2.  Diabetes- stable 3.  Anemia- unchanged 4.  HTN- controlled Mobility: Bed Mobility Bed Mobility: No Rolling Right: 2: Max assist Rolling Left: 3: Mod assist;With rail Rolling  Left Details (indicate cue type and reason): Assist to flex left leg, and cues to maintain flexion in right leg.  Utilized bed rail with tactile cue to locate Supine to Sit: 1: +1 Total assist;HOB flat Sit to Supine - Left: 1: +1 Total assist Transfers Transfers: Yes Sit to Stand: 1: +2 Total assist;From elevated surface;With upper extremity assist Sit to Stand Details (indicate cue type and reason): Use of maxisky for safety and trunk support and UE support around therapist and then up on tall table with +2 A with cues for upright posture and gaze during sit to stand and static standing during lateral weight shifting to LLE with use of mirror as visual  feedback and manual cues through LLE for activation of hip and knee extensors for sit to stand and for stability during static stand  and manual cues for L trunk elongation Stand to Sit: 1: +2 Total assist;To elevated surface Stand to Sit Details: cues for attention to L UE and for anterior lean and controlled lowering to seat Lateral/Scoot Transfers: 3: Mod assist Lateral/Scoot Transfer Details (indicate cue type and reason): scoot to R side w/c <> mat; cues needed for lateral weight shifts for slideboard placement and anterior lean to unweight hips Ambulation/Gait Ambulation/Gait Assistance: 1: +2 Total assist Stairs: No (unsafe to attempt at this time, unable to achive standing) Wheelchair Mobility Wheelchair Mobility: Yes Distance: 150 ADL:   Cognition: Cognition Overall Cognitive Status: Impaired Arousal/Alertness: Awake/alert Orientation Level: Oriented to person;Oriented to place Attention: Sustained Sustained Attention: Impaired Sustained Attention Impairment: Functional basic Memory: Impaired Memory Impairment: Retrieval deficit Awareness: Impaired Awareness Impairment: Intellectual impairment Problem Solving: Impaired Problem Solving Impairment: Functional basic;Verbal basic Behaviors: Perseveration;Other (comment) (flat affect) Safety/Judgment: Impaired Comments: Moves very quickly at times, impulsive Cognition Arousal/Alertness: Awake/alert Orientation Level: Oriented to person;Oriented to place  2. Anticoagulation/DVT prophylaxis with Pharmaceutical: Coumdin .  Continue aspirin and Lovenox  till INR therapeutic.  3. Pain Management: Monitor for now for OA symptoms.  Patient Active Hospital Problem List: DIABETES MELLITUS, TYPE II (01/03/2007)   Assessment: blood sugars 120-150 range generally but low yesterday d/t poor appeti3   Plan:Cont.  Amaryl 2mg l. Monitor over next few days. HYPERLIPIDEMIA (01/03/2007)   Assessment:    Plan: continue zocor DEPRESSION  (01/03/2007)   Assessment: flat and seems disinterested.Post CVA depression ritalin trial   Plan: Started on celexa.  Ego support by team.   NEUROPATHY, IDIOPATHIC PERIPHERAL NOS (01/07/2007)   Assessment: Pain BLE especially with increased activity per patient   Plan: Continue neurontin but increase to QID ANEMIA (07/07/2010)   Assessment: ?etiology.    Plan: Anemia panel with low iron stores.  Continue iron supplement.  Will monitor H and H for now.  Check stool guaiacs.   HYPERTENSION (07/07/2010)   Assessment:  systolic's resonalble.  Monitor for trends.   Plan: Continue losartan, metoprolol, furosemide Cardiomyopathy (04/25/2011)    POA: Unknown   Assessment: EF @ 20-25%.  Likely contributing to SOB with activity.   Plan: Monitor for symptoms.  Monitor for signs of overload.  Will add low salt and CM restrictions. Dysphagia:  On DI diet due to pocketing and impulsivity. D/C ASA   Alysia Penna E 05/08/2011, 7:39 AM

## 2011-05-08 NOTE — Progress Notes (Signed)
Pt had diaper change at 8pm and 11pm ...turn and reposition

## 2011-05-08 NOTE — Progress Notes (Signed)
Physical Therapy Session Note  Patient Details  Name: [name] MRN: [id number] Date of Birth: 1960-10-02  Today's Date: 05/08/2011 Time: 1102-1202 Time Calculation (min): 60 min  Precautions: Precautions Precautions: Fall Precaution Comments: obesity, severe left neglect Required Braces or Orthoses:  (Left PRAFO in bed) Restrictions Weight Bearing Restrictions: No Other Position/Activity Restrictions: diplopia with patch wearing schedule, homonimous hemianopsia, left inattention, left hemiplegia, apraxia, absent left sided sensation, decreased sustained attention  Short Term Goals: PT Short Term Goal 1: Pt will roll left with max assist, roll right with mod assist. PT Short Term Goal 2: Pt will supine-sit with max assist. PT Short Term Goal 3: Pt will transfer with max assist. PT Short Term Goal 3 - Progress: Met PT Short Term Goal 4: W/C mobility x 50 feet mod assist in controlled environment. PT Short Term Goal 5: Tolerate standing > 3 min with mechanical lift vs. total assist +2 to initiate weight bearing and pregait activities. PT Short Term Goal 5 - Progress: Met  Pain Pain Assessment Pain Assessment: No/denies pain  Mobility Transfers Transfers: Yes Sit to Stand: 1: +2 Total assist;With upper extremity assist;From elevated surface Sit to Stand Details: Tactile cues for initiation;Tactile cues for sequencing;Tactile cues for weight shifting;Verbal cues for sequencing;Visual cues/gestures for sequencing;Verbal cues for precautions/safety;Manual facilitation for weight shifting;Manual facilitation for placement Sit to Stand Details (indicate cue type and reason): Sit to stand with +2A (bilat UE support around therapists) but patient performing >50% of sit to stand with cues for anterior lean and R foot placement; manual facilitation for  L foot and LE positioning Stand to Sit: 1: +2 Total assist Stand to Sit Details (indicate cue type and reason): Verbal cues for  sequencing Stand to Sit Details: cues for anterior lean and controlled lowering to seat  Lateral/Scoot Transfers: 4: Min assist;3: Mod assist;With slide board;With armrests removed Lateral/Scoot Transfer Details: Tactile cues for posture;Verbal cues for sequencing Lateral/Scoot Transfer Details (indicate cue type and reason): slideboard to mat to R side with min A and cues to maintain anterior lean and for initiation, slideboard mat to w/c to L side with mod A and cues for anterior lean   Other Treatments Treatments Therapeutic Activity: Performed sit to squat and squat pivot to R and L training sitting on mat with bilat UE support on low bench in front of patient to cue patient for full anterior lean with manual facilitation for WB through LLE and LUE; patient able to maintain squat and pivot to L and R x 10 reps with verbal and tactile cues for initiation.  Also performed static standing and reaching to L for objects for increased weight shifting and weight acceptance on LLE and activaiton of LLE extensors for stability in stance with +2 A.   Neuromuscular Facilitation: Activity to increase lateral weight shifting;Activity to increase sustained activation;Left;Lower Extremity  Therapy/Group: Individual Therapy  Raylene Everts [facility] 05/08/2011, 12:16 PM

## 2011-05-08 NOTE — Progress Notes (Signed)
Recreational Therapy Assessment and Plan  Patient Details  Name: [name] MRN: [id number] Date of Birth: 1961/04/17  Rehab Potential:  Good ELOS:  4 weeks   Assessment Clinical Impression: Pt is a 50 y.o femal with history of HTN, DM, EF 30-35% with severe hypokinesis, tachycardia, hyperlipidemia, MRSA, anemia, obesity, renal failure, osteomyelitis, chronic low back pain, DJD, left 2nd toe amputation and bilat frontopariental embolic CVAs in December of 2011 admitted to [facility] 04/26/11 s/p large acute embolic right MCA CVA. PTA pt lived with sister in a single level home with three steps to enter.   Pt presents with left sided weakness, decreased vision, decreased sustained attention, decreased balance , left inattention, decreased activity tolerance,  & aphasia limiting pt's independence with leisure/community pursuits.   Recreational Therapy Leisure History/Participation Premorbid leisure interest/current participation: Games - Word-search;Games - Cards;Sports - Pharmacist, hospital;Petra [place] - Vegetable gardening;Community - Shopping mall (spades, only go to buy what i need) Spiritual Interests: Church;Womens'Men's Groups (Bible study on weds) Other Leisure Interests: Cooking/Baking;Television;Videogames;Computer (tetris) Leisure Participation Style: With Family/Friends Awareness of Community Resources: Good-identify 3 post discharge leisure resources [employer] Appropriate for Education?: Yes Patient Agreeable to [employer]?: Yes Stress Management: Poor Methods of Stress Management: take meds for stress/anxiety Patient agreeable to Pet Therapy: Yes Does patient have pets?: No Social interaction - Mood/Behavior: Cooperative Recreational Therapy Orientation Orientation -Reviewed with patient: Available activity resources;Use of Dayroom Strengths/Weaknesses Patient Strengths/Abilities: Willingness to participate Patient weaknesses: Physical limitations  Plan Min 1 time per week for >20  minutes   Discharge Criteria: Patient will be discharged from TR if patient refuses treatment 3 consecutive times without medical reason.  If treatment goals not met, if there is a change in medical status, if patient makes no progress towards goals or if patient is discharged from hospital.  The above assessment, treatment plan, treatment alternatives and goals were discussed and mutually agreed upon: by patient  [place] 05/08/2011, 12:49 PM

## 2011-05-08 NOTE — Progress Notes (Signed)
ANTICOAGULATION CONSULT NOTE - Follow Up Consult  Pharmacy Consult for warfarin Indication: CVA  Allergies  Allergen Reactions  . Ace Inhibitors Shortness Of Breath  . Vancomycin Other (See Comments)    Unknown     Vital Signs: Temp: 98.3 F (36.8 C) (11/27 0537) Temp src: Oral (11/27 0537) BP: 146/98 mmHg (11/27 0537) Pulse Rate: 86  (11/27 0537)  Labs:  Basename 05/08/11 0500 05/07/11 0701 05/06/11 0640  HGB 10.3* -- --  HCT 33.3* -- --  PLT 436* -- --  APTT -- -- --  LABPROT 23.6* 26.8* 29.2*  INR 2.06* 2.43* 2.71*  HEPARINUNFRC -- -- --  CREATININE -- -- --  CKTOTAL -- -- --  CKMB -- -- --  TROPONINI -- -- --   Estimated Creatinine Clearance: 65.5 ml/min (by C-G formula based on Cr of 1.44).   Medications:  Scheduled:    . amitriptyline  50 mg Oral QHS  . antiseptic oral rinse  15 mL Mouth Rinse BID  . citalopram  10 mg Oral QHS  . cycloSPORINE  1 drop Both Eyes BID  . ferrous sulfate  325 mg Oral BID  . furosemide  20 mg Oral Daily  . gabapentin  600 mg Oral QID  . glimepiride  2 mg Oral Q breakfast  . insulin aspart  0-15 Units Subcutaneous TID WC & HS  . losartan  100 mg Oral Daily  . methylphenidate  10 mg Oral BID WC  . metoprolol  75 mg Oral BID  . mupirocin ointment  1 application Topical BID  . simvastatin  20 mg Oral q1800  . warfarin  5 mg Oral ONCE-1800  . DISCONTD: warfarin  2.5 mg Oral q1800    Assessment: 54 YOF with recent stroke on coumadin, INR is therapeutic but trending down. Dose increased last night.  Based on previous warfarin dosing and INR response will repeat 5mg .  CBC reveals improved Hgb, platelets stable  Goal of Therapy:  INR 2-3   Plan:  1. Coumadin 5mg  PO x1 tonight 2. Continue daily INR until able to INR stable.   Clovis Riley 05/08/2011,12:24 PM

## 2011-05-09 DIAGNOSIS — I739 Peripheral vascular disease, unspecified: Secondary | ICD-10-CM

## 2011-05-09 LAB — GLUCOSE, CAPILLARY
Glucose-Capillary: 87 mg/dL (ref 70–99)
Glucose-Capillary: 150 mg/dL — ABNORMAL HIGH (ref 70–99)
Glucose-Capillary: 130 mg/dL — ABNORMAL HIGH (ref 70–99)

## 2011-05-09 LAB — PROTIME-INR
INR: 2.57 — ABNORMAL HIGH (ref 0.00–1.49)
Prothrombin Time: 28 seconds — ABNORMAL HIGH (ref 11.6–15.2)

## 2011-05-09 MED ORDER — WARFARIN SODIUM 2.5 MG PO TABS
2.5000 mg | ORAL_TABLET | Freq: Once | ORAL | Status: AC
  Administered 2011-05-09: 2.5 mg via ORAL
  Filled 2011-05-09 (×2): qty 1

## 2011-05-09 NOTE — Progress Notes (Signed)
[facility]  Daily Progress Note  Assessment/Planning: R sided stroke with severe L sided deficits, BLE PAD   L great toe pressure 50 mm Hg, suggesting she should be able to heal any lesions in that foot  No vascular surgical interventions immediately needed  She can follow up in clinic for longitudinal surveillance  If she continues to have significant hemiplegia, there is no advantage to aggressive vascular reconstruction as she would not be able to ambulate anyway.  Subjective    No significant changes, no claudication or rest pain sx  Objective Filed Vitals:   05/08/11 0537 05/08/11 1530 05/09/11 0508 05/09/11 0900  BP: 146/98 154/88 133/84   Pulse: 86 85 81   Temp: 98.3 F (36.8 C) 98.3 F (36.8 C) 98.2 F (36.8 C)   TempSrc: Oral Oral Oral   Resp: 18 18 16    Height:      Weight:    254 lb 12.8 oz (115.577 kg)  SpO2: 99% 100% 99%     Intake/Output Summary (Last 24 hours) at 05/09/11 1211 Last data filed at 05/09/11 1000  Gross per 24 hour  Intake    360 ml  Output      5 ml  Net    355 ml   VASC L calf warm, left foot warm, no palpable pulses, L great toe unchanged   Laboratory CBC    Component Value Date/Time   WBC 4.3 05/08/2011 0500   HGB 10.3* 05/08/2011 0500   HCT 33.3* 05/08/2011 0500   PLT 436* 05/08/2011 0500    BMET    Component Value Date/Time   NA 137 04/27/2011 0700   K 4.6 04/27/2011 0700   CL 108 04/27/2011 0700   CO2 22 04/27/2011 0700   GLUCOSE 146* 04/27/2011 0700   BUN 34* 04/27/2011 0700   CREATININE 1.44* 04/27/2011 0700   CALCIUM 8.5 04/27/2011 0700   GFRNONAA 42* 04/27/2011 0700   GFRAA 49* 04/27/2011 0700    Adele Barthel, MD [facility]: [phone] Pager: [phone]  05/09/2011, 12:11 PM

## 2011-05-09 NOTE — Progress Notes (Signed)
Patient Details  Name: [name] MRN: [id number] Date of Birth: 21-Jun-1960  Today's Date: 05/09/2011 Time: 9:30-10:00   Short Term Goals  Pt will participate in TR task OOB 1 time per week for >20 minutes.  Skilled Therapeutic Interventions/Progress Updates: Pt in bed and stating that she was wet.  Pt indeed wet soaking through diaper, pants, and bed pad.  Rolling right and left with using rail with Max assist.  Pt c/o fatigue from this morning's events.  No c/o pain  Therapy/Group: Individual Therapy   Breshay Ilg 05/09/2011, 9:59 AM

## 2011-05-09 NOTE — Progress Notes (Signed)
Pt was clean and diaper change (dry) 7-3 shift NT was called into pt room to verify that pt was clean and dry, before 11-7am shift leave

## 2011-05-09 NOTE — Progress Notes (Signed)
Occupational Therapy Session Note  Patient Details  Name: [name] MRN: [id number] Date of Birth: 1961/02/04  Today's Date: 05/09/2011 Time: 0730-0830 Time Calculation (min): 60 min  Precautions: Precautions Precautions: Fall Precaution Comments: obesity, severe left neglect Required Braces or Orthoses:  (Left PRAFO in bed) Restrictions Weight Bearing Restrictions: No Other Position/Activity Restrictions: diplopia with patch wearing schedule, homonimous hemianopsia, left inattention, left hemiplegia, apraxia, absent left sided sensation, decreased sustained attention  Short Term Goals: OT Short Term Goal 1: Pt will complete bathing with mod assist OT Short Term Goal 1 - Progress: Progressing toward goal OT Short Term Goal 2: Pt will complete UB dressing with mod assist OT Short Term Goal 2 - Progress: Progressing toward goal OT Short Term Goal 3: Pt will complete toilet transfer with max assist using squat pivot technique OT Short Term Goal 3 - Progress: Revised (modified due to lack of progress/goal met) OT Short Term Goal 4: Pt will complete grooming in midline seated position at sink with min cues to scan to obtain items OT Short Term Goal 4 - Progress: Met  Skilled Therapeutic Interventions/Progress Updates:    Pt seen for ADL retraining seated at EOB. Pt demonstrating increased sitting balance with dynamic sitting to complete LB bathing.  Required +2 assist to complete pericare due to incontinence of bowel.  Pt with increased ability to complete bed mobility of rolling side to side.  Focus on sit to stand with +2 assist to lift, once lifting initiated pt completed approx 50% of sit to stand task.  Min cues for posture and to "stick out belly" to increase standing balance.  Pt demonstrated increased sitting balance with attempt to don sock.  Slideboard transfer mod A +2 to stabilize chair, pt with increased attention and participation with transfer this session.  Pain   Pt  reports no pain at this time. ADL See FIM for additional ADL info ADL Comments: Improved postural control, improved dynamic sitting balance, improved sit to stand and static stand tolerance  Therapy/Group: Individual Therapy  [name] 05/09/2011, 11:31 AM

## 2011-05-09 NOTE — Progress Notes (Signed)
ANTICOAGULATION CONSULT NOTE - Follow Up Consult  Pharmacy Consult for Warfarin Indication: CVA  Allergies  Allergen Reactions  . Ace Inhibitors Shortness Of Breath  . Vancomycin Other (See Comments)    Unknown     Vital Signs: Temp: 98.2 F (36.8 C) (11/28 0508) Temp src: Oral (11/28 0508) BP: 133/84 mmHg (11/28 0508) Pulse Rate: 81  (11/28 0508)  Labs:  Basename 05/09/11 0650 05/08/11 0500 05/07/11 0701  HGB -- 10.3* --  HCT -- 33.3* --  PLT -- 436* --  APTT -- -- --  LABPROT 28.0* 23.6* 26.8*  INR 2.57* 2.06* 2.43*  HEPARINUNFRC -- -- --  CREATININE -- -- --  CKTOTAL -- -- --  CKMB -- -- --  TROPONINI -- -- --   Estimated Creatinine Clearance: 62.8 ml/min (by C-G formula based on Cr of 1.44).   Medications:  Scheduled:    . amitriptyline  50 mg Oral QHS  . antiseptic oral rinse  15 mL Mouth Rinse BID  . citalopram  10 mg Oral QHS  . cycloSPORINE  1 drop Both Eyes BID  . ferrous sulfate  325 mg Oral BID  . furosemide  20 mg Oral Daily  . gabapentin  600 mg Oral QID  . glimepiride  2 mg Oral Q breakfast  . insulin aspart  0-15 Units Subcutaneous TID WC & HS  . losartan  100 mg Oral Daily  . methylphenidate  10 mg Oral BID WC  . metoprolol  75 mg Oral BID  . mupirocin ointment  1 application Topical BID  . simvastatin  20 mg Oral q1800  . warfarin  5 mg Oral ONCE-1800   Assessment:  68 YOF with recent stroke on coumadin, INR is therapeutic, improved with 5mg  x 2 days. No bleeding noted.  Goal of Therapy:  INR 2-3   Plan:  1. Coumadin 2.5mg  tonight 2. Continue daily INR for now.   [name] 05/09/2011,12:14 PM

## 2011-05-09 NOTE — Progress Notes (Signed)
Patient ID: [name], female   DOB: 1961/03/21, 50 y.o.   MRN: [id number] Subjective/Complaints: No new complaints, doesn't know how she feels yet.  Comfortable night.  Objective: Vital Signs: Blood pressure 133/84, pulse 81, temperature 98.2 F (36.8 C), temperature source Oral, resp. rate 16, height 5\' 7"  (1.702 m), weight 124.8 kg (275 lb 2.2 oz), SpO2 99.00%. No results found.  Basename 05/08/11 0500  WBC 4.3  HGB 10.3*  HCT 33.3*  PLT 436*   CBG (last 3)   Basename 05/08/11 2149 05/08/11 1703 05/08/11 1130  GLUCAP 151* 77 131*   Results for orders placed during the hospital encounter of 04/26/11 (from the past 24 hour(s))  GLUCOSE, CAPILLARY     Status: Normal   Collection Time   05/08/11  7:12 AM      Component Value Range   Glucose-Capillary 77  70 - 99 (mg/dL)   Comment 1 Notify RN    GLUCOSE, CAPILLARY     Status: Abnormal   Collection Time   05/08/11 11:30 AM      Component Value Range   Glucose-Capillary 131 (*) 70 - 99 (mg/dL)  GLUCOSE, CAPILLARY     Status: Normal   Collection Time   05/08/11  5:03 PM      Component Value Range   Glucose-Capillary 77  70 - 99 (mg/dL)  GLUCOSE, CAPILLARY     Status: Abnormal   Collection Time   05/08/11  9:49 PM      Component Value Range   Glucose-Capillary 151 (*) 70 - 99 (mg/dL)   Comment 1 Notify RN      No results found for this basename: NA:2,K:2,CL:2,CO2:2,GLUCOSE:2,BUN:2,CREATININE:2,CALCIUM:2 in the last 72 hours BP Readings from Last 3 Encounters:  05/09/11 133/84  04/26/11 132/85  04/26/11 132/85   INR  Date Value Range Status  05/08/2011 2.06* 0.00-1.49 (no units) Final      Physical Exam: General appearance: morbidly obese, flat affect. Resp: clear to auscultation bilaterally Cardio: regular rate and rhythm GI: soft, non-tender; bowel sounds normal; no masses,  no organomegaly Extremities: no edema, redness or tenderness in the calves or thighs Pulses: 2+ and symmetric Skin: Skin color,  texture, turgor normal. No rashes or lesions Neurologic: Gait: working on pregait activities. Alert and oriented X 3, flat affect, left inattention, left hemiparesis UE(0/5)>LE except 2- L triceps.  Left hemisensory deficits plus insensate feet bilateral .  Poor awareness . Assessment/Plan: 1. Functional deficits secondary to  Right Cardioembolic CVA  which require 3+ hours per day of interdisciplinary therapy in a comprehensive inpatient rehab setting. Physiatrist is providing close team supervision and 24 hour management of active medical problems listed below. Physiatrist and rehab team continue to assess barriers to discharge/monitor patient progress toward functional and medical goals. 2.  Diabetes- stable 3.  Anemia- unchanged 4.  HTN- controlled Mobility: Bed Mobility Bed Mobility: No Rolling Right: 2: Max assist Rolling Left: 3: Mod assist;With rail Rolling Left Details (indicate cue type and reason): Assist to flex left leg, and cues to maintain flexion in right leg.  Utilized bed rail with tactile cue to locate Supine to Sit: 1: +1 Total assist;HOB flat Sit to Supine - Left: 1: +1 Total assist Transfers Transfers: Yes Sit to Stand: 1: +2 Total assist;With upper extremity assist;From elevated surface Sit to Stand Details (indicate cue type and reason): Sit to stand with +2A (bilat UE support around therapists) but patient performing >50% of sit to stand with cues for anterior lean and R  foot placement; manual facilitation for  L foot and LE positioning Stand to Sit: 1: +2 Total assist Stand to Sit Details: cues for anterior lean and controlled lowering to seat  Lateral/Scoot Transfers: 4: Min assist;3: Mod assist;With slide board;With armrests removed Lateral/Scoot Transfer Details (indicate cue type and reason): slideboard to mat to R side with min A and cues to maintain anterior lean and for initiation, slideboard mat to w/c to L side with mod A and cues for anterior lean    Ambulation/Gait Ambulation/Gait Assistance: 1: +2 Total assist Stairs: No (unsafe to attempt at this time, unable to achive standing) Wheelchair Mobility Wheelchair Mobility: Yes Distance: 150 ADL:   Cognition: Cognition Overall Cognitive Status: Impaired Arousal/Alertness: Awake/alert Orientation Level: Oriented to person;Oriented to place Attention: Sustained Sustained Attention: Impaired Sustained Attention Impairment: Functional basic Memory: Impaired Memory Impairment: Retrieval deficit Awareness: Impaired Awareness Impairment: Intellectual impairment Problem Solving: Impaired Problem Solving Impairment: Functional basic;Verbal basic Behaviors: Perseveration;Other (comment) (flat affect) Safety/Judgment: Impaired Comments: Moves very quickly at times, impulsive Cognition Arousal/Alertness: Awake/alert Orientation Level: Oriented to person;Oriented to place  2. Anticoagulation/DVT prophylaxis with Pharmaceutical: Coumdin .  Continue aspirin and Lovenox  till INR therapeutic.  3. Pain Management: Monitor for now for OA symptoms.  Patient Active Hospital Problem List: DIABETES MELLITUS, TYPE II (01/03/2007)   Assessment: blood sugars 120-150 range generally but low yesterday d/t poor appeti3   Plan:Cont.  Amaryl 2mg l. Monitor over next few days. HYPERLIPIDEMIA (01/03/2007)   Assessment:    Plan: continue zocor DEPRESSION (01/03/2007)   Assessment: flat and seems disinterested.Post CVA depression ritalin trial   Plan: Started on celexa.  Ego support by team.   NEUROPATHY, IDIOPATHIC PERIPHERAL NOS (01/07/2007)   Assessment: Pain BLE especially with increased activity per patient   Plan: Continue neurontin but increase to QID ANEMIA (07/07/2010)   Assessment: ?etiology.    Plan: Anemia panel with low iron stores.  Continue iron supplement.  Will monitor H and H for now.  Check stool guaiacs.   HYPERTENSION (07/07/2010)   Assessment:  systolic's resonalble.  Monitor for  trends.   Plan: Continue losartan, metoprolol, furosemide Cardiomyopathy (04/25/2011)    POA: Unknown   Assessment: EF @ 20-25%.  Likely contributing to SOB with activity.   Plan: Monitor for symptoms.  Monitor for signs of overload.  Will add low salt and CM restrictions. Dysphagia:  On DI diet due to pocketing and impulsivity.  Team conf today  Charlett Blake 05/09/2011, 7:05 AM

## 2011-05-09 NOTE — Patient Care Conference (Signed)
Inpatient RehabilitationTeam Conference Note Date: 05/09/2011   Time: 10:00AM    Patient Name: [name]      Medical Record Number: [id number]  Date of Birth: 08-Sep-1960 Sex: Female         Room/Bed: 4001/4001-01 Payor Info: Payor: MEDICAID [place]  Plan: MEDICAID [place] ACCESS  Product Type: *No Product type*     Admitting Diagnosis: (r) cva  Admit Date/Time:  04/26/2011  2:12 PM Admission Comments: No comment available   Primary Diagnosis:  CVA (cerebral infarction) Principal Problem: CVA (cerebral infarction)  Patient Active Problem List  Diagnoses Date Noted  . CVA (cerebral infarction) 04/26/2011  . Cardiomyopathy 04/25/2011  . CVA (cerebral vascular accident) 04/22/2011  . TACHYCARDIA 07/25/2010  . OBESITY 07/07/2010  . ANEMIA 07/07/2010  . HYPERTENSION 07/07/2010  . CVA 07/07/2010  . DVT 07/07/2010  . RENAL FAILURE 07/07/2010  . OSTEOMYELITIS 07/07/2010  . SLEEP APNEA 07/07/2010  . NEUROPATHY, IDIOPATHIC PERIPHERAL NOS 01/07/2007  . DIABETES MELLITUS, TYPE II 01/03/2007  . HYPERLIPIDEMIA 01/03/2007  . ANXIETY 01/03/2007  . DEPRESSION 01/03/2007  . GERD 01/03/2007  . DEGENERATIVE JOINT DISEASE 01/03/2007  . Lumbago 01/03/2007  . HEADACHE 01/03/2007    Expected Discharge Date: Expected Discharge Date:  (4 weeks)  Team Members Present: Physician: Dr. Alysia Penna Case Manager Present: Jesse Fall, RN Social Worker Present: Ovidio Kin, LCSW PT Present: Raylene Everts, PT OT Present: [name], OT SLP Present: Gunnar Fusi, SLP Antony Salmon, OT RN: Andria Meuse, RN     Current Status/Progress Goal Weekly Team Focus  Medical   BP and CBGs well controlled no UE strength  Maintain good control of risk factors    see above   Bowel/Bladder   pt incontinent bowel and bladder, timed toileting q3h  pt will be continent of bowel and bladder with mod assistance  continue with timed toileting to eliminate incontinence   Swallow/Nutrition/  Hydration   Dys.2 textures, thin liquids, full supervision  supervision  current diet toleration and potnetial trilas of Dys.3 textures   ADL's    mod A bathing, mod A UB dsg, total A LB dsg, mod-max assist slideboard transfers    min A    visual compensatory strategies, LB bathing and dsg techniques   Mobility   min-mod A slideboard transfers, +2 A to stand and for ambulation with lift equipment  min A w/c level  decreased assistance for transfers, sit to stand, static standing and activation of LLE   Communication             Safety/Cognition/ Behavioral Observations  moderate assist with improved attention and awarness  min-moderate assist  continue midline, left attention, problem solving and requesting help as needed   Pain   no current complaints of pain  <2  continue to monitor for pain and effectiveness of prn pain medications   Skin   l great toe wound with dry dressing c.d.i., small skin opening in abdominal fold open to air c.d.i.  no new breakdown  no new breakdown, treat current skin issues      *See Interdisciplinary Assessment and Plan and progress notes for long and short-term goals  Barriers to Discharge: Heavy physical care    Possible Resolutions to Barriers:  Family training vs SNF placement    Discharge Planning/Teaching Needs:  Pt and sister leaning toward short term NHP due to the amount of care pt requires. Start FL2 and begin looking      Team Discussion:  Pt's neuropathic pain improved.  DM controlled.Slow progress but attention improved, also sitting balance.Incontinent of bowel and bladder -- unaware when soiled.  Revisions to Treatment Plan:  none   Continued Need for Acute Rehabilitation Level of Care: The patient requires daily medical management by a physician with specialized training in physical medicine and rehabilitation for the following conditions: Daily direction of a multidisciplinary physical rehabilitation program to ensure safe  treatment while eliciting the highest outcome that is of practical value to the patient.: Yes Daily medical management of patient stability for increased activity during participation in an intensive rehabilitation regime.: Yes Daily analysis of laboratory values and/or radiology reports with any subsequent need for medication adjustment of medical intervention for : Neurological problems;Cardiac problems  Met with pt's sister to report on Team Conference. Discussed SNF options. Pt in agreement with transferring to a SNF from CIR.  [name] 05/09/11, 12:06 PM

## 2011-05-09 NOTE — Progress Notes (Signed)
Speech Language Pathology Progress Notes  Speech Language Pathology Session Note    Short Term Goals: 1. Patient will demonstrate problem solving with moderate assist semantic cues with BADLs.  2. Patient will demonstrate selective attention to BADL/task for 10-12 minutes with moderate semantic cues.  3. Patient will request help as needed with moderate semantic cues.  4. Patient will consume Dys.2 texures without observed clinical signs of aspiration with: Minimal assistance; semantic cues.  5 .Patient will recall and utilize recommended strategies during swallow to increase swallowing safety with: Minimal assistance; semantic cues.  Skilled Therapeutic Interventions/Progress Updates: Session focused on problem solving with functional tasks; sorting money, cards, etc in midline field of vision with max assist semantic, visual and tactile cues used however visual impairment are continuing to impact ability to problem solve. Patient with moderate assist semantic cue to make safe choices with attempts to drink while lying down with no request for SLP to assist with sitting or getting cup placed out of reach. Patient requested to end session early due to fatigue.    Therapy Start Time/End Time: Time Calculation Start Time: 1330 Stop Time: 1410 Time Calculation (min): 40 min  Pain: Pain Assessment Pain Assessment: No/denies pain  FIM: Comprehension Comprehension Mode: Auditory Comprehension: 4-Understands basic 75 - 89% of the time/requires cueing 10 - 24% of the time Expression Expression Mode: Verbal Expression: 5-Expresses basic 90% of the time/requires cueing < 10% of the time. Social Interaction Social Interaction Mode: Asleep Social Interaction: 5-Interacts appropriately 90% of the time - Needs monitoring or encouragement for participation or interaction. Problem Solving Problem Solving Mode: Asleep Problem Solving: 2-Solves basic 25 - 49% of the time - needs direction more than  half the time to initiate, plan or complete simple activities Memory Memory Mode: Asleep Memory: 3-Recognizes or recalls 50 - 74% of the time/requires cueing 25 - 49% of the time   Therapy/Group: Individual Therapy   [name] 05/09/2011 2:46 PM

## 2011-05-09 NOTE — Progress Notes (Signed)
Social Work Patient ID: [name], female   DOB: [birth year], 50 y.o.   MRN: [id number]   Met with pt to discuss discharge plans. She and sister have spoken and feel that the best option is to go to a SNF for a short time, then return home Once she is a higher level and not as much care for her sister.  Discussed she would go sooner than four weeks here. Will send out to all of [facility] Then inform pt options.  She is agreeable to this plan.  I will try to find a [place] that can offer pt therapy due to pt's Medicaid only status. Aware of the team conference and update. She realizes she is progressing but it is slow.

## 2011-05-09 NOTE — Progress Notes (Signed)
Pt alert and oriented x 3 today. Pt transfers with sliding board to right side with max assist from bed to chair and chair to bed. Requires cueing for safety with transfer. Incontinent of bowel and bladder today. Small skin tear noted in abdominal fold, barrier cream applied, open to air. L great toe dry dressing changed, small amount of sanguinous drainage noted. No complaints of pain today. D3 diet and requires full supervision with meals. Takes pills whole with puree. Daughter in room. Continue with plan of care.

## 2011-05-09 NOTE — Progress Notes (Signed)
Physical Therapy Session Note  Patient Details  Name: [name] MRN: [id number] Date of Birth: Dec 01, 1960  Today's Date: 05/09/2011 Time:1030-1130 and  1425-1500 Time Calculation (min): 60 min and 35 min  Precautions: Precautions Precautions: Fall Precaution Comments: obesity, severe left neglect Required Braces or Orthoses:  (Left PRAFO in bed) Restrictions Weight Bearing Restrictions: No Other Position/Activity Restrictions: diplopia with patch wearing schedule, homonimous hemianopsia, left inattention, left hemiplegia, apraxia, absent left sided sensation, decreased sustained attention  Skilled Therapeutic Interventions: Treatments focused on bed mobility, and SB transfers. Bed mobility training with max A to roll L, and mod A to stabilize LLE during bridging for moving sideways in bed and L sidelying to sit.  SBT with +2 in AM, +1 mod A in PM.  Neuromuscular re-ed during weight bearing in sitting, scooting and standing via manual facilitation, verbal cues.  Pt demonstrates perceptual deficits in use of RUE and RLE during transfers, but improves with tactile cues for placement during SB transfers.  Sit> stand +2 A for safety, pt performing 50%.    Dynamic standing balance: with +2 A, pt stood and reached R with LUE to retrieve and place items overhead; A at L knee for stability.    General: chart reviewed   Pain Pain Assessment Pain Assessment: No/denies pain           Therapy/Group: Individual Therapy  [name] 05/09/2011, 3:17 PM

## 2011-05-09 NOTE — Progress Notes (Signed)
ABI completed at 09:15.  Preliminary report:  Right ABI is not ascertained due to non compressible vessels secondary to calcification.  Left ABI is greater than 1.0; but calcified vessels may cause falsely elevated blood pressures.  Great toe pressure on the right is 90 mmHg and 50 mmHg on the left, which indicate adequate perfusion bilaterally. Charlaine Dalton 05/09/2011, 11:40 AM

## 2011-05-10 DIAGNOSIS — I6330 Cerebral infarction due to thrombosis of unspecified cerebral artery: Secondary | ICD-10-CM

## 2011-05-10 DIAGNOSIS — Z5189 Encounter for other specified aftercare: Secondary | ICD-10-CM

## 2011-05-10 DIAGNOSIS — G8110 Spastic hemiplegia affecting unspecified side: Secondary | ICD-10-CM

## 2011-05-10 LAB — GLUCOSE, CAPILLARY
Glucose-Capillary: 97 mg/dL (ref 70–99)
Glucose-Capillary: 127 mg/dL — ABNORMAL HIGH (ref 70–99)

## 2011-05-10 MED ORDER — INSULIN ASPART 100 UNIT/ML ~~LOC~~ SOLN
0.0000 [IU] | Freq: Three times a day (TID) | SUBCUTANEOUS | Status: DC
  Administered 2011-05-13: 3 [IU] via SUBCUTANEOUS
  Administered 2011-05-14 – 2011-05-15 (×2): 2 [IU] via SUBCUTANEOUS
  Administered 2011-05-16: 3 [IU] via SUBCUTANEOUS
  Filled 2011-05-10: qty 3

## 2011-05-10 MED ORDER — INSULIN ASPART 100 UNIT/ML ~~LOC~~ SOLN: 0.0000 [IU] | Freq: Every day | SUBCUTANEOUS | Status: DC

## 2011-05-10 MED ORDER — WARFARIN SODIUM 2.5 MG PO TABS
2.5000 mg | ORAL_TABLET | Freq: Once | ORAL | Status: AC
  Administered 2011-05-10: 2.5 mg via ORAL
  Filled 2011-05-10: qty 1

## 2011-05-10 NOTE — Progress Notes (Signed)
Occupational Therapy Session Note  Patient Details  Name: [name] MRN: [id number] Date of Birth: Jun 22, 1960  Today's Date: 05/10/2011 Time: 1330-1400 Time Calculation (min): 30 min  Precautions: Precautions Precautions: Fall Precaution Comments: left neglect Required Braces or Orthoses:  (Left PRAFO in bed) Restrictions Weight Bearing Restrictions: No Other Position/Activity Restrictions: diplopia with patch wearing schedule, homonimous hemianopsia, left hemiplegia, apraxia, absent left sided sensation, decreased sustained attention  Short Term Goals: OT Short Term Goal 1: Pt will complete bathing with mod assist OT Short Term Goal 1 - Progress: Progressing toward goal OT Short Term Goal 2: Pt will complete UB dressing with mod assist OT Short Term Goal 2 - Progress: Progressing toward goal OT Short Term Goal 3: Pt will complete toilet transfer with max assist using squat pivot technique OT Short Term Goal 3 - Progress: Revised (modified due to lack of progress/goal met) OT Short Term Goal 4: Pt will complete grooming in midline seated position at sink with min cues to scan to obtain items OT Short Term Goal 4 - Progress: Met  Skilled Therapeutic Interventions/Progress Updates:    Focus on bed mobility with rolling side to side with min assist to roll with setup of bending LLE.  Pt incontinent prior to session, first portion of session focus on rolling to change brief and clean up.  Slideboard transfer with mod assist with assist to lift pt/initiate lift +2 to stabilize chair and assist with lifting.  Therapeutic ex in sitting with scanning to Lt to match cards by color.  Use of eye patch over Rt eye to minimize double vision, verbal cues for pt to state color of card followed by scanning for matching color -- this improved her accuracy and scanning.  Pain   No c/o pain this session  Therapy/Group: Individual Therapy  [name] 05/10/2011, 3:17 PM

## 2011-05-10 NOTE — Progress Notes (Signed)
Pt in good spirits today. Conversing with staff appropriately. No c/o pain. Meds whole with applesauce. Good appetite. Supervised meals. Transfer with slide board, maxi lift. Incontinent of bowel and bladder.  Participating with therapy, Continue with care plan.

## 2011-05-10 NOTE — Progress Notes (Signed)
Patient ID: [name], female   DOB: [birth year], 50 y.o.   MRN: [id number] Patient ID: [name], female   DOB: 1960/09/11, 50 y.o.   MRN: [id number] Subjective/Complaints: No new complaints, doesn't know how she feels yet.  Comfortable night.  Objective: Vital Signs: Blood pressure 150/90, pulse 83, temperature 98 F (36.7 C), temperature source Oral, resp. rate 18, height 5\' 7"  (1.702 m), weight 115.577 kg (254 lb 12.8 oz), SpO2 97.00%. No results found.  Basename 05/08/11 0500  WBC 4.3  HGB 10.3*  HCT 33.3*  PLT 436*   CBG (last 3)   Basename 05/09/11 2054 05/09/11 1627 05/09/11 1135  GLUCAP 130* 104* 150*   Results for orders placed during the hospital encounter of 04/26/11 (from the past 24 hour(s))  GLUCOSE, CAPILLARY     Status: Abnormal   Collection Time   05/09/11 11:35 AM      Component Value Range   Glucose-Capillary 150 (*) 70 - 99 (mg/dL)   Comment 1 Notify RN    GLUCOSE, CAPILLARY     Status: Abnormal   Collection Time   05/09/11  4:27 PM      Component Value Range   Glucose-Capillary 104 (*) 70 - 99 (mg/dL)   Comment 1 Notify RN    GLUCOSE, CAPILLARY     Status: Abnormal   Collection Time   05/09/11  8:54 PM      Component Value Range   Glucose-Capillary 130 (*) 70 - 99 (mg/dL)   Comment 1 Notify RN      No results found for this basename: NA:2,K:2,CL:2,CO2:2,GLUCOSE:2,BUN:2,CREATININE:2,CALCIUM:2 in the last 72 hours BP Readings from Last 3 Encounters:  05/10/11 150/90  04/26/11 132/85  04/26/11 132/85   INR  Date Value Range Status  05/09/2011 2.57* 0.00-1.49 (no units) Final      Physical Exam: General appearance: morbidly obese, flat affect. Resp: clear to auscultation bilaterally Cardio: regular rate and rhythm GI: soft, non-tender; bowel sounds normal; no masses,  no organomegaly Extremities: no edema, redness or tenderness in the calves or thighs Pulses: 2+ and symmetric Skin: Skin color, texture, turgor normal. No rashes or  lesions Neurologic: Gait: working on pregait activities. Alert and oriented X 3, flat affect, left inattention, left hemiparesis UE(0/5)>LE except 2- L triceps.  Left hemisensory deficits plus insensate feet bilateral .  Poor awareness . Assessment/Plan: 1. Functional deficits secondary to  Right Cardioembolic CVA  which require 3+ hours per day of interdisciplinary therapy in a comprehensive inpatient rehab setting. Physiatrist is providing close team supervision and 24 hour management of active medical problems listed below. Physiatrist and rehab team continue to assess barriers to discharge/monitor patient progress toward functional and medical goals. 2.  Diabetes- stable 3.  Anemia- unchanged 4.  HTN- controlled Mobility: Bed Mobility Bed Mobility: No Rolling Right: 2: Max assist Rolling Left: 3: Mod assist;With rail Rolling Left Details (indicate cue type and reason): Assist to flex left leg, and cues to maintain flexion in right leg.  Utilized bed rail with tactile cue to locate Supine to Sit: 1: +1 Total assist;HOB flat Sit to Supine - Left: 1: +1 Total assist Transfers Transfers: Yes Sit to Stand: 1: +2 Total assist;With upper extremity assist;From elevated surface Sit to Stand Details (indicate cue type and reason): Sit to stand with +2A (bilat UE support around therapists) but patient performing >50% of sit to stand with cues for anterior lean and R foot placement; manual facilitation for  L foot and LE positioning Stand to  Sit: 1: +2 Total assist Stand to Sit Details: cues for anterior lean and controlled lowering to seat  Lateral/Scoot Transfers: 4: Min assist;3: Mod assist;With slide board;With armrests removed Lateral/Scoot Transfer Details (indicate cue type and reason): slideboard to mat to R side with min A and cues to maintain anterior lean and for initiation, slideboard mat to w/c to L side with mod A and cues for anterior lean  Ambulation/Gait Ambulation/Gait  Assistance: 1: +2 Total assist Stairs: No (unsafe to attempt at this time, unable to achive standing) Wheelchair Mobility Wheelchair Mobility: Yes Distance: 150 ADL:   Cognition: Cognition Overall Cognitive Status: Impaired Arousal/Alertness: Awake/alert Orientation Level: Oriented to person;Oriented to place Attention: Sustained Sustained Attention: Impaired Sustained Attention Impairment: Functional basic Memory: Impaired Memory Impairment: Retrieval deficit Awareness: Impaired Awareness Impairment: Intellectual impairment Problem Solving: Impaired Problem Solving Impairment: Functional basic;Verbal basic Behaviors: Perseveration;Other (comment) (flat affect) Safety/Judgment: Impaired Comments: Moves very quickly at times, impulsive Cognition Arousal/Alertness: Awake/alert Orientation Level: Oriented to person;Oriented to place  2. Anticoagulation/DVT prophylaxis with Pharmaceutical: Coumdin .  Continue aspirin and Lovenox  till INR therapeutic.  3. Pain Management: Monitor for now for OA symptoms.  Patient Active Hospital Problem List: DIABETES MELLITUS, TYPE II (01/03/2007)   Assessment: blood sugars 120-150 range generally   Plan:Cont.  Amaryl 2mg l. Monitor over next few days. HYPERLIPIDEMIA (01/03/2007)   Assessment:    Plan: continue zocor DEPRESSION (01/03/2007)   Assessment: flat and seems disinterested.Post CVA depression ritalin trial   Plan: Started on celexa.  Ego support by team.   NEUROPATHY, IDIOPATHIC PERIPHERAL NOS (01/07/2007)   Assessment: Pain BLE especially with increased activity per patient   Plan: Continue neurontin but increase to QID ANEMIA (07/07/2010)   Assessment: ?etiology.    Plan: Anemia panel with low iron stores.  Continue iron supplement.  Will monitor H and H for now.  Check stool guaiacs.   HYPERTENSION (07/07/2010)   Assessment:  systolic's resonalble.  Monitor for trends.   Plan: Continue losartan, metoprolol,  furosemide Cardiomyopathy (04/25/2011)    POA: Unknown   Assessment: EF @ 20-25%.  Likely contributing to SOB with activity.   Plan: Monitor for symptoms.  Monitor for signs of overload.  Will add low salt and CM restrictions. Dysphagia:  On DI diet due to pocketing and impulsivity.  Charlett Blake 05/10/2011, 7:12 AM

## 2011-05-10 NOTE — Progress Notes (Signed)
ANTICOAGULATION CONSULT NOTE - Follow Up Consult  Pharmacy Consult for Coumadin Indication: CVA  Allergies  Allergen Reactions  . Ace Inhibitors Shortness Of Breath  . Vancomycin Other (See Comments)    Unknown     Vital Signs: Temp: 98 F (36.7 C) (11/29 0514) Temp src: Oral (11/29 0514) BP: 150/90 mmHg (11/29 0514) Pulse Rate: 83  (11/29 0514)  Labs:  Basename 05/10/11 0700 05/09/11 0650 05/08/11 0500  HGB -- -- 10.3*  HCT -- -- 33.3*  PLT -- -- 436*  APTT -- -- --  LABPROT 31.8* 28.0* 23.6*  INR 3.02* 2.57* 2.06*  HEPARINUNFRC -- -- --  CREATININE -- -- --  CKTOTAL -- -- --  CKMB -- -- --  TROPONINI -- -- --   Estimated Creatinine Clearance: 62.8 ml/min (by C-G formula based on Cr of 1.44).   Medications:  Scheduled:    . amitriptyline  50 mg Oral QHS  . antiseptic oral rinse  15 mL Mouth Rinse BID  . citalopram  10 mg Oral QHS  . cycloSPORINE  1 drop Both Eyes BID  . ferrous sulfate  325 mg Oral BID  . furosemide  20 mg Oral Daily  . gabapentin  600 mg Oral QID  . glimepiride  2 mg Oral Q breakfast  . insulin aspart  0-15 Units Subcutaneous TID WC & HS  . losartan  100 mg Oral Daily  . methylphenidate  10 mg Oral BID WC  . metoprolol  75 mg Oral BID  . mupirocin ointment  1 application Topical BID  . simvastatin  20 mg Oral q1800  . warfarin  2.5 mg Oral ONCE-1800   Assessment:  72 YOF with recent stroke on coumadin, INR is at upper end of therapeutic range and INR trend up.  Expect INR to decrease as lower dose given last night.  No bleeding noted.   Goal of Therapy:  INR 2-3   Plan:  1. Coumadin 2.5mg  tonight  2. Continue daily INR for now.   [name] 05/10/2011,11:19 AM

## 2011-05-10 NOTE — Progress Notes (Signed)
Occupational Therapy Session Note  Patient Details  Name: [name] MRN: [id number] Date of Birth: [birth year]  Today's Date: 05/10/2011 Time: 1105-1200 Time Calculation (min): 55 min  Precautions: Precautions Precautions: Fall Precaution Comments: left neglect Required Braces or Orthoses:  (Left PRAFO in bed) Restrictions Weight Bearing Restrictions: No Other Position/Activity Restrictions: diplopia with patch wearing schedule, homonimous hemianopsia, left hemiplegia, apraxia, absent left sided sensation, decreased sustained attention  Short Term Goals: OT Short Term Goal 1: Pt will complete bathing with mod assist OT Short Term Goal 1 - Progress: Progressing toward goal OT Short Term Goal 2: Pt will complete UB dressing with mod assist OT Short Term Goal 2 - Progress: Progressing toward goal OT Short Term Goal 3: Pt will complete toilet transfer with max assist using squat pivot technique OT Short Term Goal 3 - Progress: Revised (modified due to lack of progress/goal met) OT Short Term Goal 4: Pt will complete grooming in midline seated position at sink with min cues to scan to obtain items OT Short Term Goal 4 - Progress: Met  Skilled Therapeutic Interventions/Progress Updates:    Pt seen for ADL retraining at sink from w/c level.  Focus on forward scooting to prepare for sit to stand and sit to stand with +2 with mod assist to stand, pt requires UE support and min cues to push up from w/c and assist to lift while initiating sit to stand from legs.  Pt demonstrated improved sit to stand with increased participation and initiation.  Encouraged visual scanning to Lt environment to obtain items at sink to complete bathing and grooming.  Weight shifting in standing with verbal and tactile cues to hip check to shift weight to Rt side and place equal weight through both legs.  Pain Pain Assessment Pain Assessment: No/denies pain Pain Score: 0-No pain ADL ADL ADL Comments: Improved  postural control, improved dynamic sitting balance, improved sit to stand and static stand tolerance See FIM for additional information  Therapy/Group: Individual Therapy  [name] 05/10/2011, 12:17 PM

## 2011-05-10 NOTE — Progress Notes (Signed)
Physical Therapy Session Note  Patient Details  Name: [name] MRN: [id number] Date of Birth: December 16, 1960  Today's Date: 05/10/2011 Time:  [id number]    Precautions: Precautions Precautions: Fall Precaution Comments: left neglect Required Braces or Orthoses:  (Left PRAFO in bed) Restrictions Weight Bearing Restrictions: No Other Position/Activity Restrictions: diplopia with patch wearing schedule, homonimous hemianopsia, left hemiplegia, apraxia, absent left sided sensation, decreased sustained attention  Short Term Goals: PT Short Term Goal 1: Pt will roll left with max assist, roll right with mod assist. PT Short Term Goal 1- Progress: Met PT Short Term Goal 2: Pt will supine-sit with max assist. PT Short Term Goal 2- Progress: Met PT Short Term Goal 3: Pt will transfer with max assist. PT Short Term Goal 3 - Progress: Met PT Short Term Goal 4: W/C mobility x 50 feet mod assist in controlled environment. PT Short Term Goal 4- Progress: Met PT Short Term Goal 5: Tolerate standing > 3 min with mechanical lift vs. total assist +2 to initiate weight bearing and pregait activities. PT Short Term Goal 5 - Progress: Met  Skilled Therapeutic Interventions/Progress Updates:     Pain Pain Assessment Pain Assessment: No/denies pain Pain Score: 0-No pain Mobility Bed Mobility Rolling Right: 2: Max assist Rolling Right Details: Tactile cues for sequencing;Manual facilitation for placement Rolling Left: 3: Mod assist Rolling Left Details: Tactile cues for sequencing;Manual facilitation for placement Supine to Sit: 2: Max assist Supine to Sit Details: Manual facilitation for weight shifting;Tactile cues for sequencing;Manual facilitation for placement Transfers Lateral/Scoot Transfers: With slide board;2: Max assist Lateral/Scoot Transfer Details: Manual facilitation for weight shifting;Manual facilitation for placement;Tactile cues for sequencing         Other Treatments  Treatments Therapeutic Activity: Pt with incontinent of urine episode in bed upon entry, bed saturated through brief. Pt unable to feel this. Rolling left and right in bed with NDT facilitation at head and pelvis for typical movement patterns, sequencing, and weight shift mod-max assist. Improved initiation of movement, however due to decreased sustained attention to task pt does not maintain positions > 30 seconds. Hygiene and clothing change with total assist + 2. W/C mobility training x 100 feet with mod assist and mod verbal cues for sequencing and environmental scanning. Seated self feeding activity with set-up assist for opening containers and mod verbal cues for swallowing strategies.  Therapy/Group: Individual Therapy  Kendrick Ranch 05/10/2011, 8:26 AM

## 2011-05-10 NOTE — Progress Notes (Signed)
Physical Therapy Weekly Progress Note  Patient Details  Name: [name] MRN: [id number] Date of Birth: 01-27-1961 Today's Date: 05/10/2011  Patient is a 50 y.o. female admitted to [facility] s/p large acute right MCA CVA. Patient has made good progress with PT this week, from total assist + 2 for bed mobility and transfers to current mod-max assist for bed mobility and transfers. Pre-gait activities are also progressing with use of Maxi Sky and Ethelene Hal, with improved activation of left LE in weight bearing positions. Although patient is better able to initiate left LE activation in weight bearing positions, sustained contractions are not maintained > 5-10 seconds at this time. Sustained attention is also improving, however internal and external distractions also decrease sustained attention to task resulting in decreased postural control and sustained left sided contractions during mobility. Hands on family education was initiated during therapies with patient's sister this week. Due to the level of assistance the patient requires at this time, the patient and her sister have decided to discharge to SNF prior to discharge home. Patient will continue to benefit from skilled inpatient physical therapy to continue to addres remaining initiation, sustained use, attention, awareness, and strength deficits to further enhance overall performance with activity tolerance, balance, postural control, ability to compensate for deficits, functional use of  left lower extremity, attention, awareness and coordination for maximum functional gains prior to dischrage to next venue of care.  Patient progressing toward long term goals. 5 of 5 short term goals were met this week.  Continue plan of care.  PT Short Term Goals PT Short Term Goal 1: Pt will roll left with max assist, roll right with mod assist. PT Short Term Goal 2: Pt will supine-sit with max assist. PT Short Term Goal 3: Pt will transfer with max assist. PT  Short Term Goal 3 - Progress: Met PT Short Term Goal 4: W/C mobility x 50 feet mod assist in controlled environment. PT Short Term Goal 5: Tolerate standing > 3 min with mechanical lift vs. total assist +2 to initiate weight bearing and pregait activities. PT Short Term Goal 5 - Progress: Met  Kendrick Ranch 05/10/2011, 10:34 AM

## 2011-05-10 NOTE — Progress Notes (Signed)
Speech Language Pathology Progress Notes  Speech Language Pathology Session Note  Short Term Goals:  1. Patient will demonstrate problem solving with moderate assist semantic cues with BADLs.  2. Patient will demonstrate selective attention to BADL/task for 10-12 minutes with moderate semantic cues.  3. Patient will request help as needed with moderate semantic cues.  4. Patient will consume Dys.2 texures without observed clinical signs of aspiration with: Minimal assistance; semantic cues.  5 .Patient will recall and utilize recommended strategies during swallow to increase swallowing safety with: Minimal assistance; semantic cues.   Skilled Therapeutic Interventions/Progress Updates: RN administering medications with with pocketing and upon cuing patient to perform a lingual sweep, unsuccessfully then SLP facilitated with demonstration cues to perform manual sweep was effective at removing pocketed food from breakfast and pills with some success; however oral care was needed to remove breakfast residue.  SLP facilitated oral care with max assist semantic and demonstration cues to brush inside left oral cavity.  Pt with no awareness of brushing outside of cheek.  Session then focused on visual scanning and problem solving with SLP facilitating with a small field 4 pictures on a contrasted card with mod assist tactile and visual cues to locate 2 items on left.  Pt then required max assist semantic cues to determine which object was different.    Therapy Start Time/End Time: Time Calculation Start Time: 0905 Stop Time: 1000 Time Calculation (min): 55 minutes  Pain: Pain Assessment Pain Assessment: No/denies pain Pain Score: 0-No pain Pain Type: Acute pain Pain Location: Foot Pain Orientation: Right;Left Pain Descriptors: Aching Pain Onset:  (in therapy) Pain Intervention(s): Medication (See eMAR) Multiple Pain Sites: No  FIM: Comprehension Comprehension Mode: Auditory Comprehension:  3-Understands basic 50 - 74% of the time/requires cueing 25 - 50%  of the time Expression Expression Mode: Verbal Expression: 4-Expresses basic 75 - 89% of the time/requires cueing 10 - 24% of the time. Needs helper to occlude trach/needs to repeat words. Social Interaction Social Interaction Mode: Asleep Social Interaction: 4-Interacts appropriately 75 - 89% of the time - Needs redirection for appropriate language or to initiate interaction. Problem Solving Problem Solving Mode: Asleep Problem Solving: 2-Solves basic 25 - 49% of the time - needs direction more than half the time to initiate, plan or complete simple activities Memory Memory Mode: Asleep Memory: 3-Recognizes or recalls 50 - 74% of the time/requires cueing 25 - 49% of the time   Therapy/Group: Individual Therapy     [name] 05/10/2011 9:54 AM

## 2011-05-11 DIAGNOSIS — Z5189 Encounter for other specified aftercare: Secondary | ICD-10-CM

## 2011-05-11 DIAGNOSIS — G8110 Spastic hemiplegia affecting unspecified side: Secondary | ICD-10-CM

## 2011-05-11 DIAGNOSIS — I6330 Cerebral infarction due to thrombosis of unspecified cerebral artery: Secondary | ICD-10-CM

## 2011-05-11 LAB — BASIC METABOLIC PANEL
BUN: 21 mg/dL (ref 6–23)
CO2: 26 mEq/L (ref 19–32)
Chloride: 107 mEq/L (ref 96–112)
Creatinine, Ser: 1.1 mg/dL (ref 0.50–1.10)
GFR calc Af Amer: 68 mL/min — ABNORMAL LOW (ref >90)
Glucose, Bld: 76 mg/dL (ref 70–99)
Potassium: 4.8 mEq/L (ref 3.5–5.1)

## 2011-05-11 LAB — GLUCOSE, CAPILLARY
Glucose-Capillary: 83 mg/dL (ref 70–99)
Glucose-Capillary: 111 mg/dL — ABNORMAL HIGH (ref 70–99)
Glucose-Capillary: 119 mg/dL — ABNORMAL HIGH (ref 70–99)

## 2011-05-11 LAB — PROTIME-INR: Prothrombin Time: 29.8 seconds — ABNORMAL HIGH (ref 11.6–15.2)

## 2011-05-11 MED ORDER — WARFARIN SODIUM 2.5 MG PO TABS
2.5000 mg | ORAL_TABLET | Freq: Once | ORAL | Status: AC
  Administered 2011-05-11: 2.5 mg via ORAL
  Filled 2011-05-11: qty 1

## 2011-05-11 NOTE — Progress Notes (Signed)
Progress Notes  Speech Language Pathology Therapy Note  Patient Details  Name: Bettyjean Swilling MRN: [id number] Date of Birth: Feb 07, 1961  Today's Date: 05/11/2011 Time: 0830-0930 Time Calculation (min): 60 min  Precautions: Precautions Precautions: Fall Precaution Comments: left neglect Required Braces or Orthoses: No Restrictions Weight Bearing Restrictions: No Other Position/Activity Restrictions: diplopia with patch wearing schedule, homonimous hemianopsia, Lt hemiplegia, apraxia, decreased sustained attention  Short Term Goals:  1. Patient will demonstrate problem solving with moderate assist semantic cues with BADLs.  2. Patient will demonstrate selective attention to BADL/task for 10-12 minutes with moderate semantic cues.  3. Patient will request help as needed with moderate semantic cues.  4. Patient will consume Dys.2 texures without observed clinical signs of aspiration with: Minimal assistance; semantic cues.  5 .Patient will recall and utilize recommended strategies during swallow to increase swallowing safety with: Minimal assistance; semantic cues.  Skilled Therapeutic Interventions/Progress Updates: Session focused visual scanning to slight right orientation of 4"x4" field with 1"x1" 4 colored blocks with SLP facilitating task with moderate assist verbal, visual, tactile and demonstration cues to match colors due to deficits with midline inattention, scanning difficulties as well as problem solving difficulties. Patient present with good verbal memory skills (able to tell SLP what strategies to use); however, difficult carrying strategies over into function.   Precautions/Restrictions  Precautions Precautions: Fall Precaution Comments: left neglect Required Braces or Orthoses: No Restrictions Other Position/Activity Restrictions: diplopia with patch wearing schedule, homonimous hemianopsia, Lt hemiplegia, apraxia, decreased sustained attention   Pain Pain  Assessment Pain Assessment: No/denies pain  Therapy/Group: Individual Therapy  Carmelia Roller., CCC-SLP [id number] [place] 05/11/2011 11:09 AM

## 2011-05-11 NOTE — Progress Notes (Signed)
Occupational Therapy Session Note  Patient Details  Name: [name] MRN: [id number] Date of Birth: [birth year]  Today's Date: 05/11/2011 Time: 0730-0830 Time Calculation (min): 60 min  Precautions: Precautions Precautions: Fall Precaution Comments: left neglect Required Braces or Orthoses: No Restrictions Weight Bearing Restrictions: No Other Position/Activity Restrictions: diplopia with patch wearing schedule, homonimous hemianopsia, Lt hemiplegia, apraxia, decreased sustained attention  Short Term Goals: OT Short Term Goal 1: Pt will complete bathing with mod assist OT Short Term Goal 1 - Progress: Met OT Short Term Goal 2: Pt will complete UB dressing with mod assist OT Short Term Goal 2 - Progress: Met OT Short Term Goal 3: Pt will complete toilet transfer with max assist using squat pivot technique OT Short Term Goal 3 - Progress: Revised (modified due to lack of progress/goal met) OT Short Term Goal 4: Pt will complete grooming in midline seated position at sink with min cues to scan to obtain items OT Short Term Goal 4 - Progress: Met  Skilled Therapeutic Interventions/Progress Updates:    Pt seen for ADL retraining with focus on bed mobility to complete pericare with rolling side to side.  Pt requires mod assist and max cues with setup to roll to Rt and min assist to roll to Lt with use of bed rails to roll over.  Completed pericare at bed level to increase independence, rest of bathing performed sitting EOB.  Pt continues to require +2 assist to stand with verbal and tactile cues to weight shift to maintain midline standing.  Grooming performed at sink with cues to scan to Rt and Lt to obtain items and cues for object orientation.  Pt continues to demonstrate perceptual deficits with visual scanning and functional tasks such as applying toothpaste to toothbrush.  Pain Pain Assessment Pain Assessment: No/denies pain ADL ADL Grooming: Setup;Minimal cueing Where  Assessed-Grooming: Sitting at sink;Wheelchair Upper Body Bathing: Moderate assistance Where Assessed-Upper Body Bathing: Edge of bed Lower Body Bathing: Moderate assistance (at bed level with rolling side to side to complete pericare) Where Assessed-Lower Body Bathing: Edge of bed;Bed level (bed level to increase independence with pericare) Upper Body Dressing: Moderate assistance Where Assessed-Upper Body Dressing: Edge of bed Lower Body Dressing: Dependent (pt donning Rt leg pants and assisting with pulling up pants) Where Assessed-Lower Body Dressing: Edge of bed Toileting: Unable to assess Toilet Transfer: Unable to assess Tub/Shower Transfer: Unable to assess (not safe to complete tub/shower transfer at this time) ADL Comments: Improved postural control, improved dynamic sitting balance, improved sit to stand and static stand tolerance  Therapy/Group: Individual Therapy  [name] 05/11/2011, 10:33 AM

## 2011-05-11 NOTE — Progress Notes (Signed)
Alert and oriented no complaint of pain. Eye patch alternate every 2 hr. Participates in therapy's. 2 person max assist transfer. toliet every 2 hr. Lt neglect. Takes medications whole in apple sauce. Continue plan of care.

## 2011-05-11 NOTE — Progress Notes (Signed)
Patient ID: [name], female   DOB: 10/15/1960, 50 y.o.   MRN: [id number] Patient ID: [name], female   DOB: 04/19/1961, 50 y.o.   MRN: [id number] Patient ID: [name], female   DOB: 12-17-1960, 50 y.o.   MRN: [id number] Subjective/Complaints: No new complaints, doesn't know how she feels yet.  Comfortable night.  Objective: Vital Signs: Blood pressure 120/82, pulse 82, temperature 98.3 F (36.8 C), temperature source Oral, resp. rate 18, height 5\' 7"  (1.702 m), weight 115.577 kg (254 lb 12.8 oz), SpO2 96.00%. No results found. No results found for this basename: WBC:2,HGB:2,HCT:2,PLT:2 in the last 72 hours CBG (last 3)   Basename 05/10/11 2052 05/10/11 1629 05/10/11 1201  GLUCAP 139* 72 127*   Results for orders placed during the hospital encounter of 04/26/11 (from the past 24 hour(s))  GLUCOSE, CAPILLARY     Status: Normal   Collection Time   05/10/11  7:13 AM      Component Value Range   Glucose-Capillary 97  70 - 99 (mg/dL)   Comment 1 Notify RN    GLUCOSE, CAPILLARY     Status: Abnormal   Collection Time   05/10/11 12:01 PM      Component Value Range   Glucose-Capillary 127 (*) 70 - 99 (mg/dL)   Comment 1 Notify RN    GLUCOSE, CAPILLARY     Status: Normal   Collection Time   05/10/11  4:29 PM      Component Value Range   Glucose-Capillary 72  70 - 99 (mg/dL)   Comment 1 Notify RN    GLUCOSE, CAPILLARY     Status: Abnormal   Collection Time   05/10/11  8:52 PM      Component Value Range   Glucose-Capillary 139 (*) 70 - 99 (mg/dL)   Comment 1 Notify RN      No results found for this basename: NA:2,K:2,CL:2,CO2:2,GLUCOSE:2,BUN:2,CREATININE:2,CALCIUM:2 in the last 72 hours BP Readings from Last 3 Encounters:  05/11/11 120/82  04/26/11 132/85  04/26/11 132/85   INR  Date Value Range Status  05/10/2011 3.02* 0.00-1.49 (no units) Final      Physical Exam: General appearance: morbidly obese, flat affect. Resp: clear to auscultation bilaterally Cardio:  regular rate and rhythm GI: soft, non-tender; bowel sounds normal; no masses,  no organomegaly Extremities: no edema, redness or tenderness in the calves or thighs Pulses: 2+ and symmetric Skin: Skin color, texture, turgor normal. No rashes or lesions Neurologic: Gait: working on pregait activities. Alert and oriented X 3, flat affect, left inattention, left hemiparesis UE(0/5)>LE except 2- L triceps.  Left hemisensory deficits plus insensate feet bilateral .  Poor awareness.  LLE has 2-/5 knee/hip extensor synergy . Assessment/Plan: 1. Functional deficits secondary to  Right Cardioembolic CVA  which require 3+ hours per day of interdisciplinary therapy in a comprehensive inpatient rehab setting. Physiatrist is providing close team supervision and 24 hour management of active medical problems listed below. Physiatrist and rehab team continue to assess barriers to discharge/monitor patient progress toward functional and medical goals. 2.  Diabetes- stable 3.  Anemia- unchanged 4.  HTN- controlled Mobility: Bed Mobility Bed Mobility: No Rolling Right: 2: Max assist Rolling Left: 3: Mod assist Rolling Left Details (indicate cue type and reason): Assist to flex left leg, and cues to maintain flexion in right leg.  Utilized bed rail with tactile cue to locate Supine to Sit: 2: Max assist Sit to Supine - Left: 1: +1 Total assist Transfers Transfers: Yes Sit to  Stand: 1: +2 Total assist;With upper extremity assist;From elevated surface Sit to Stand Details (indicate cue type and reason): Sit to stand with +2A (bilat UE support around therapists) but patient performing >50% of sit to stand with cues for anterior lean and R foot placement; manual facilitation for  L foot and LE positioning Stand to Sit: 1: +2 Total assist Stand to Sit Details: cues for anterior lean and controlled lowering to seat  Lateral/Scoot Transfers: With slide board;2: Max assist Lateral/Scoot Transfer Details (indicate  cue type and reason): slideboard to mat to R side with min A and cues to maintain anterior lean and for initiation, slideboard mat to w/c to L side with mod A and cues for anterior lean  Ambulation/Gait Ambulation/Gait Assistance: 1: +2 Total assist Stairs: No (unsafe to attempt at this time, unable to achive standing) Wheelchair Mobility Wheelchair Mobility: Yes Distance: 150 ADL:   Cognition: Cognition Overall Cognitive Status: Impaired Arousal/Alertness: Awake/alert Orientation Level: Oriented to person;Oriented to place;Oriented to time Attention: Sustained Sustained Attention: Impaired Sustained Attention Impairment: Functional basic Memory: Impaired Memory Impairment: Retrieval deficit Awareness: Impaired Awareness Impairment: Intellectual impairment Problem Solving: Impaired Problem Solving Impairment: Functional basic;Verbal basic Behaviors: Perseveration;Other (comment) (flat affect) Safety/Judgment: Impaired Comments: Moves very quickly at times, impulsive Cognition Arousal/Alertness: Awake/alert Orientation Level: Oriented to person;Oriented to place;Oriented to time  2. Anticoagulation/DVT prophylaxis with Pharmaceutical: Coumdin .  Continue aspirin and Lovenox  till INR therapeutic.  3. Pain Management: Monitor for now for OA symptoms.  Patient Active Hospital Problem List: DIABETES MELLITUS, TYPE II (01/03/2007)   Assessment: blood sugars 120-150 range generally   Plan:Cont.  Amaryl 2mg l. Monitor over next few days. HYPERLIPIDEMIA (01/03/2007)   Assessment:    Plan: continue zocor DEPRESSION (01/03/2007)   Assessment: flat and seems disinterested.Post CVA depression ritalin trial   Plan: Started on celexa.  Ego support by team.   NEUROPATHY, IDIOPATHIC PERIPHERAL NOS (01/07/2007)   Assessment: Pain BLE especially with increased activity per patient   Plan: Continue neurontin but increase to QID ANEMIA (07/07/2010)   Assessment: ?etiology.    Plan: Anemia  panel with low iron stores.  Continue iron supplement.  Will monitor H and H for now.  Check stool guaiacs.   HYPERTENSION (07/07/2010)   Assessment:  systolic's resonalble.  Monitor for trends.   Plan: Continue losartan, metoprolol, furosemide Cardiomyopathy (04/25/2011)    POA: Unknown   Assessment: EF @ 20-25%.  Likely contributing to SOB with activity.   Plan: Monitor for symptoms.  Monitor for signs of overload.  Will add low salt and CM restrictions. Dysphagia:  On DI diet due to pocketing and impulsivity.  Alysia Penna E 05/11/2011, 7:11 AM

## 2011-05-11 NOTE — Progress Notes (Signed)
Speech Pathology: Dysphagia Treatment Note  Group treatment Time: 1130-1200  Patient was observed with : Dys.2 Ground and Thin liquids.  Patient was noted to have s/s of aspiration : No:    Patient required: mod-max semantic cues to consistently follow precautions/strategies of not talking with food in mouth, using manual sweep to clear left pocketing.  Clinical Impression: Patient continues to need close supervision with meals to ensure safety with p.o. Intake and reduce oral residue to prevent penetration and aspiration.  Patient's oral sensory deficits greatly impact her ability to self monitor.  Recommendations:  Continue with recommendations: current diet and close supervision with meals.  Pain:   none Intervention Required:   No   Gunnar Fusi, M.A., CCC-SLP [phone]

## 2011-05-11 NOTE — Progress Notes (Signed)
ANTICOAGULATION CONSULT NOTE - Follow Up Consult  Pharmacy Consult for Coumadin Indication: CVA  Allergies  Allergen Reactions  . Ace Inhibitors Shortness Of Breath  . Vancomycin Other (See Comments)    Unknown     Vital Signs: Temp: 98.3 F (36.8 C) (11/30 0500) Temp src: Oral (11/30 0500) BP: 120/82 mmHg (11/30 0500) Pulse Rate: 82  (11/30 0500)  Labs:  Basename 05/11/11 0641 05/10/11 0700 05/09/11 0650  HGB -- -- --  HCT -- -- --  PLT -- -- --  APTT -- -- --  LABPROT 29.8* 31.8* 28.0*  INR 2.78* 3.02* 2.57*  HEPARINUNFRC -- -- --  CREATININE 1.10 -- --  CKTOTAL -- -- --  CKMB -- -- --  TROPONINI -- -- --   Estimated Creatinine Clearance: 82.1 ml/min (by C-G formula based on Cr of 1.1).   Medications:  Scheduled:     . amitriptyline  50 mg Oral QHS  . antiseptic oral rinse  15 mL Mouth Rinse BID  . citalopram  10 mg Oral QHS  . cycloSPORINE  1 drop Both Eyes BID  . ferrous sulfate  325 mg Oral BID  . furosemide  20 mg Oral Daily  . gabapentin  600 mg Oral QID  . glimepiride  2 mg Oral Q breakfast  . insulin aspart  0-15 Units Subcutaneous TID WC  . insulin aspart  0-5 Units Subcutaneous QHS  . losartan  100 mg Oral Daily  . methylphenidate  10 mg Oral BID WC  . metoprolol  75 mg Oral BID  . mupirocin ointment  1 application Topical BID  . simvastatin  20 mg Oral q1800  . warfarin  2.5 mg Oral ONCE-1800  . DISCONTD: insulin aspart  0-15 Units Subcutaneous TID WC & HS   Assessment:  4 YOF with recent stroke on Coumadin.  INR mid-range today post dose decrease.  No bleeding noted.   Goal of Therapy:  INR 2-3   Plan:  1. Repeat Coumadin 2.5mg  PO tonight  2. Continue daily INR for now.   Johnnette Gourd Dien 05/11/2011,10:58 AM

## 2011-05-11 NOTE — Progress Notes (Signed)
Occupational Therapy Weekly Progress Note  Patient Details  Name: [name] MRN: [id number] Date of Birth: 12-20-1960 Today's Date: 05/11/2011  Patient has met 3 of 4 short term goals.  Pt has demonstrated increased sitting balance at EOB to increase participation with bathing and dressing.  Pt continues to require +2 to complete pericare if in sit to stand or assist to roll side to side to complete pericare at bed level.  Pt with improved ability to reach towards feet to assist with pulling up pants and attempting to don socks.    Patient continues to demonstrate the following deficits: attention, Lt neglect, decreased standing balance and sit to stand, and therefore will continue to benefit from skilled OT intervention to enhance overall performance with ADL and visual re-ed and compensation.  Patient progressing toward long term goals..  Continue plan of care.  OT Short Term Goals OT Short Term Goal 1: Pt will complete bathing with mod assist OT Short Term Goal 1 - Progress: Met OT Short Term Goal 2: Pt will complete UB dressing with mod assist OT Short Term Goal 2 - Progress: Met OT Short Term Goal 3: Pt will complete toilet transfer with max assist using squat pivot technique OT Short Term Goal 3 - Progress: Revised (modified due to lack of progress/goal met) OT Short Term Goal 4: Pt will complete grooming in midline seated position at sink with min cues to scan to obtain items OT Short Term Goal 4 - Progress: Met  [name] 05/11/2011, 9:53 AM

## 2011-05-11 NOTE — Progress Notes (Signed)
Physical Therapy Session Note  Patient Details  Name: [name] MRN: [id number] Date of Birth: 1960/08/06  Today's Date: 05/11/2011 Time: 1325-1350 Time Calculation (min): 25 min  Precautions: Precautions Precautions: Fall Precaution Comments: left neglect Required Braces or Orthoses:  (Left PRAFO in bed) Restrictions Weight Bearing Restrictions: No Other Position/Activity Restrictions: diplopia with patch wearing schedule, homonimous hemianopsia, left hemiplagia, apraxia, decreased sustained attention.  Short Term Goals: PT Short Term Goal 1: Pt will roll left with max assist, roll right with mod assist. PT Short Term Goal 2: Pt will supine-sit with max assist. PT Short Term Goal 3: Pt will transfer with max assist. PT Short Term Goal 3 - Progress: Met PT Short Term Goal 4: W/C mobility x 50 feet mod assist in controlled environment. PT Short Term Goal 5: Tolerate standing > 3 min with mechanical lift vs. total assist +2 to initiate weight bearing and pregait activities. PT Short Term Goal 5 - Progress: Met  Skilled Therapeutic Interventions/Progress Updates:     General Chart Reviewed: Yes   Pain Pain Assessment Pain Assessment: No/denies pain Pain Score: 0-No pain Mobility Transfers Sit to Stand: 1: +2 Total assist;From elevated surface Sit to Stand Details: Manual facilitation for weight shifting;Tactile cues for posture;Verbal cues for sequencing Stand to Sit: 1: +2 Total assist Stand to Sit Details (indicate cue type and reason): Tactile cues for posture;Verbal cues for sequencing;Manual facilitation for weight shifting Lateral/Scoot Transfers: 2: Max assist;With slide board Lateral/Scoot Transfer Details: Manual facilitation for weight shifting;Manual facilitation for placement;Tactile cues for sequencing Lateral/Scoot Transfer Details (indicate cue type and reason): does well with quick, "1, 2, 3, scoot!" verbal cues to initiate and scoot during transfer. Over  the back bobath technique to facilitate anterior lateral weight shift.    Other Treatments Treatments Therapeutic Activity: 50 minute co-treat with occupational therapist for + 2 skilled facilitation for standing pregait activities. Standing with bilat UE support on tall table to increase symmetry and left UE weight bearing, lateral weight shifts with right heel lift, manual facilitation at trunk and left LE for alignment, left trunk elongation, and sustained left LE stance components. Improved left LE contraction noted, but inconsistent and unsustained at this time.   Therapy/Group: Co-Treatment  Kendrick Ranch 05/11/2011, 3:10 PM

## 2011-05-11 NOTE — Progress Notes (Signed)
Occupational Therapy Session Note  Patient Details  Name: Tnia Szostek MRN: [id number] Date of Birth: 11-Apr-1961  Today's Date: 05/11/2011 Time: [id number] (co-treat 1300-1350 with PT) Time Calculation (min): 25 min  Precautions: Precautions Precautions: Fall Precaution Comments: left neglect Required Braces or Orthoses: No Restrictions Weight Bearing Restrictions: No Other Position/Activity Restrictions: diplopia with patch wearing schedule, homonimous hemianopsia, Lt hemiplegia, apraxia, decreased sustained attention  Short Term Goals: OT Short Term Goal 1: Pt will complete bathing with mod assist OT Short Term Goal 1 - Progress: Met OT Short Term Goal 2: Pt will complete UB dressing with mod assist OT Short Term Goal 2 - Progress: Met OT Short Term Goal 3: Pt will complete toilet transfer with max assist using squat pivot technique OT Short Term Goal 3 - Progress: Revised (modified due to lack of progress/goal met) OT Short Term Goal 4: Pt will complete grooming in midline seated position at sink with min cues to scan to obtain items OT Short Term Goal 4 - Progress: Met  Skilled Therapeutic Interventions/Progress Updates:    Pt seen for co-treatment with PT for 50 mins to benefit from 2 skilled therapists to facilitate weight bearing in standing while focusing on visual attention and scanning to Lt body and environment.  Use of mirror for visual cues to facilitate and encourage midline standing and midline sitting with therapeutic activities.  Pt required mod assist slideboard transfer from w/c to mat with second person stabilizing board and w/c.  Pt required mod verbal cues to encourage scanning to Lt to obtain items and cues to organize visual stimulus in front of pt while weight bearing was being facilitated through LUE.  Pt continues to present with perceptual deficits and double vision which is being compensated for with eye patch.  Pain   Pt with no c/o pain this session,  reports fatigue.  Therapy/Group: Co-Treatment  [name] 05/11/2011, 2:06 PM

## 2011-05-11 NOTE — Progress Notes (Signed)
OccupationalTherapy Note  Patient Details  Name: [name] MRN: [id number] Date of Birth: [birth year] Today's Date: 05/11/2011  [employer] Time: 1200 - 69 30 min   Skilled Intervention: pt seen in [employer] to address increasing safety and independence with self feeding. Focus on visually scanning to left, increasing attention to left body and environment. Pt required Mod continuous verbal and auditory cues to scan into left field. Pt unaware of placing items, i.e. Cup on top of left arm. Used left arm as anchor to repetitively have pt scan to ID left arm to find lunch items. Noted impulsivity. Mod vc to follow safe swallowing techniques. Mod vc to perform left cheek sweep. Pt asked to go to bathroom and session completed in room with toileting using Sarah lift.   Group treatment   [name],[name] 05/11/2011, 1:30 PM

## 2011-05-12 DIAGNOSIS — Z5189 Encounter for other specified aftercare: Secondary | ICD-10-CM

## 2011-05-12 DIAGNOSIS — I6330 Cerebral infarction due to thrombosis of unspecified cerebral artery: Secondary | ICD-10-CM

## 2011-05-12 DIAGNOSIS — G8110 Spastic hemiplegia affecting unspecified side: Secondary | ICD-10-CM

## 2011-05-12 LAB — GLUCOSE, CAPILLARY: Glucose-Capillary: 83 mg/dL (ref 70–99)

## 2011-05-12 LAB — PROTIME-INR: INR: 2.27 — ABNORMAL HIGH (ref 0.00–1.49)

## 2011-05-12 MED ORDER — WARFARIN SODIUM 2.5 MG PO TABS
2.5000 mg | ORAL_TABLET | ORAL | Status: DC
  Administered 2011-05-13 – 2011-05-14 (×2): 2.5 mg via ORAL
  Filled 2011-05-12 (×3): qty 1

## 2011-05-12 MED ORDER — WARFARIN SODIUM 5 MG PO TABS
5.0000 mg | ORAL_TABLET | ORAL | Status: DC
  Administered 2011-05-12 – 2011-05-15 (×2): 5 mg via ORAL
  Filled 2011-05-12 (×2): qty 1

## 2011-05-12 NOTE — Progress Notes (Signed)
Speech Language Pathology Therapy Note  Patient Details  Name: [name] MRN: [id number] Date of Birth: 03-May-1961  Today's Date: 05/12/2011 Time: 1130-1200 Time Calculation (min): 30 min  Precautions: falls, swallowing, aspiration   Short Term Goals:Short Term Goals:  1. Patient will demonstrate problem solving with moderate assist semantic cues with BADLs.  2. Patient will demonstrate selective attention to BADL/task for 10-12 minutes with moderate semantic cues.  3. Patient will request help as needed with moderate semantic cues.  4. Patient will consume Dys.2 texures without observed clinical signs of aspiration with: Minimal assistance; semantic cues.  5 .Patient will recall and utilize recommended strategies during swallow to increase swallowing safety with: Minimal assistance; semantic cues.   Skilled Therapeutic Interventions/Progress Updates: Pt seen today during meal of D2 with thin liquids to address swallow strategies. Mirror set up in front of patient. Pt used strategies with minimal verbal assist from therapist. No over signs or symptoms of aspiration were observed.    Continue swallow therapy per plan of care.     Therapy/Group: Individual Therapy  Verlin Grills 05/12/2011 5:07 PM

## 2011-05-12 NOTE — Progress Notes (Signed)
Patient ID: [name], female   DOB: August 15, 1960, 50 y.o.   MRN: [id number]  Subjective/Complaints: No new complaints, considering options for SNF discharge to continue rehab support.    Objective: Vital Signs: BP 111/76  Pulse 85  Temp(Src) 98.7 F (37.1 C) (Oral)  Resp 17  Ht 5\' 7"  (1.702 m)  Wt 115.577 kg (254 lb 12.8 oz)  BMI 39.91 kg/m2  SpO2 96%  No results found for this basename: WBC:2,HGB:2,HCT:2,PLT:2 in the last 72 hours CBG (last 3)   Basename 05/12/11 0723 05/11/11 2048 05/11/11 1645  GLUCAP 79 82 119*    Basename 05/11/11 0641  NA 141  K 4.8  CL 107  CO2 26  GLUCOSE 76  BUN 21  CREATININE 1.10  CALCIUM 8.7   Lab Results  Component Value Date   INR 2.78* 05/11/2011   INR 3.02* 05/10/2011   INR 2.57* 05/09/2011    Physical Exam: General: No apparent distress    Lungs: Normal respiratory effort. Lungs are clear to auscultation, no crackles or wheezes. Cardiovascular: Regular rate and rhythm, no edema Musculoskeletal:  Neurovascularly intact Neurological: L HP L>UE, L neglect unchanged  Assessment/Plan: 1. Functional deficits secondary to  Right Cardioembolic CVA  which require 3+ hours per day of interdisciplinary therapy in a comprehensive inpatient rehab setting. Physiatrist is providing close team supervision and 24 hour management of active medical problems listed below. Physiatrist and rehab team continue to assess barriers to discharge/monitor patient progress toward functional and medical goals.  2.  Anticoagulation/DVT prophylaxis with Pharmaceutical: Coumadin .    3. Pain Management: Monitor for now for OA symptoms.  Patient's other  Active Hospital Problem List: DIABETES MELLITUS, TYPE II    Assessment: blood sugars 120-150 range generally   Plan: The current medical regimen is effective;  continue present plan and medications.   HYPERLIPIDEMIA   Assessment: stable   Plan: The current medical regimen is effective;  continue present  plan and medications.   DEPRESSION    Assessment: flat and seems disinterested.Post CVA depression ritalin trial   Plan: Started on celexa, continue same.  Ego support by team.    NEUROPATHY, IDIOPATHIC PERIPHERAL NOS   Assessment: Pain BLE especially with increased activity per patient   Plan: Continue neurontin  ANEMIA    Assessment: stable   Plan: Anemia panel with low iron stores.  Continue iron supplement.  Will monitor H and H for now.     HYPERTENSION  BP Readings from Last 3 Encounters:  05/12/11 111/76  04/26/11 132/85  04/26/11 132/85     Assessment: stable.  Monitor for trends.   Plan: Continue losartan, metoprolol, furosemide  IRETON,SUSAN C 05/12/2011, 9:22 AM   I have personally seen and examined the patient and agree as listed here Gwendolyn Grant ,MD 11:05 AM

## 2011-05-12 NOTE — Progress Notes (Signed)
Alert, confused at times. Flat.  No bed mobility, 2-3 person with slide board. Using lift when  Patient tired. Toilet every 2 hr.  Bed alarm for safety. Left prafo  In bed. Continue plan of care

## 2011-05-12 NOTE — Progress Notes (Signed)
Physical Therapy Session Note  Patient Details  Name: [name] MRN: [id number] Date of Birth: [birth year]  Today's Date: 05/12/2011 Time:08:50  - [id number]    Precautions: Precautions Precautions: Fall Precaution Comments: Fall Required Braces or Orthoses:  (Left PRAFO in bed) Restrictions Weight Bearing Restrictions: No Other Position/Activity Restrictions: diplopia with patch wearing schedule, homonimous hemianopsia, left hemiplagia, apraxia, decreased sustained attention. Pain Pain Assessment Pain Assessment: No/denies pain  Short Term Goals: PT Short Term Goal 1: Pt will roll left with max assist, roll right with mod assist. PT Short Term Goal 2: Pt will supine-sit with max assist. PT Short Term Goal 3: Pt will transfer with max assist. PT Short Term Goal 3 - Progress: Met PT Short Term Goal 4: W/C mobility x 50 feet mod assist in controlled environment. PT Short Term Goal 5: Tolerate standing > 3 min with mechanical lift vs. total assist +2 to initiate weight bearing and pregait activities. PT Short Term Goal 5 - Progress: Met   Skilled Therapeutic Interventions/Progress Updates:   Mobility Bed Mobility Rolling Left: 2: Max assist;With rail Rolling Left Details: Tactile cues for initiation;Tactile cues for sequencing;Tactile cues for weight shifting;Verbal cues for technique;Verbal cues for sequencing Supine to Sit: 2: Max assist Supine to Sit Details: Tactile cues for weight shifting;Verbal cues for technique;Verbal cues for sequencing  Therapeutic Exercise: Performed Left LE PROM stretching supine in bed for hip ERs and ADDs and ankle PFs   Therapeutic Activity:    Attempted to perform standing with STEDY but unable with one person assist.  Transfered pt to w/c for upright sitting for increased activity tolerance and to work on postural control. Transfers: Lateral/Scoot Transfers: 1: +2 Total assist;With slide board Lateral/Scoot Transfer Details: Tactile cues for  initiation;Tactile cues for sequencing;Tactile cues for weight shifting;Verbal cues for sequencing;Other (comment) (total assist for board placement: pt leaned to elft side wit) Lateral/Scoot Transfer Details (indicate cue type and reason): Total assit for board placement; Min A for pt to lean to left side for board placement.   Therapy/Group: Individual Therapy  Bjorn Loser, PTA 05/12/2011, 9:52 AM

## 2011-05-13 LAB — GLUCOSE, CAPILLARY
Glucose-Capillary: 110 mg/dL — ABNORMAL HIGH (ref 70–99)
Glucose-Capillary: 67 mg/dL — ABNORMAL LOW (ref 70–99)

## 2011-05-13 NOTE — Progress Notes (Signed)
Physical Therapy Session Note  Patient Details  Name: [name] MRN: [id number] Date of Birth: June 11, 1961  Today's Date: 05/13/2011 Time: 1100-1155 Time Calculation (min): 55 min  Precautions: Precautions Precautions: Fall Precaution Comments: Fall Required Braces or Orthoses:  (Left PRAFO in bed) Restrictions Weight Bearing Restrictions: No Other Position/Activity Restrictions: diplopia with patch wearing schedule, homonimous hemianopsia, left hemiplagia, apraxia, decreased sustained attention.  Short Term Goals: PT Short Term Goal 1: Pt will roll left with max assist, roll right with mod assist. PT Short Term Goal 2: Pt will supine-sit with max assist. PT Short Term Goal 3: Pt will transfer with max assist. PT Short Term Goal 3 - Progress: Met PT Short Term Goal 4: W/C mobility x 50 feet mod assist in controlled environment. PT Short Term Goal 5: Tolerate standing > 3 min with mechanical lift vs. total assist +2 to initiate weight bearing and pregait activities. PT Short Term Goal 5 - Progress: Met  Skilled Therapeutic Interventions/Progress Updates: Focus of treatment: standing (using standing frame) with dynamic reaching activities to faciliate trunk control/allignment in stance; partial sit to stands using standing frame to facilitate Left hip,knee motor control in weight bearing; transfer training using sliding board.    General Pt up in wc     Pain no complaint of pain   Mobility transfer training- mod/max assist + setup using sliding board (bobath style- over trunk) wc to mat/mat to wc                   Other Treatments  Standing frame- pt initially needed max assist to attain midline stance (leans to left) with mirror for visual cueing; with mirror pt attempted to shift to right with right UE assist; pt performed reaching activities to right and left to facilitate midline trunk control and Left knee extension; pt performed partial sit to stands using Standing  frame belt to improve left knee and hip extension. Pt. Pt. Requires max assist for stand to sit with limited eccentric control. Therapy/Group: Individual Therapy  Romeo Zielinski,JIM 05/13/2011, 11:50 AM

## 2011-05-13 NOTE — Progress Notes (Addendum)
Patient ID: [name], female   DOB: 02-Jun-1961, 50 y.o.   MRN: [id number]  Subjective/Complaints: Considering options for SNF discharge to continue rehab support. No new deficits or complaints.    Objective: Vital Signs: BP 122/88  Pulse 86  Temp(Src) 98.2 F (36.8 C) (Oral)  Resp 18  Ht 5\' 7"  (1.702 m)  Wt 115.577 kg (254 lb 12.8 oz)  BMI 39.91 kg/m2  SpO2 96%  No results found for this basename: WBC:2,HGB:2,HCT:2,PLT:2 in the last 72 hours CBG (last 3)   Basename 05/13/11 0811 05/12/11 2023 05/12/11 1624  GLUCAP 110* 155* 83    Basename 05/11/11 0641  NA 141  K 4.8  CL 107  CO2 26  GLUCOSE 76  BUN 21  CREATININE 1.10  CALCIUM 8.7   Lab Results  Component Value Date   INR 2.27* 05/12/2011   INR 2.78* 05/11/2011   INR 3.02* 05/10/2011    Physical Exam: General: No apparent distress.    Lungs: Normal respiratory effort. Lungs are clear to auscultation, no crackles or wheezes. Cardiovascular: Regular rate and rhythm, no edema Musculoskeletal:  Neurovascularly intact Neurological: L HP L>UE, L neglect unchanged  Assessment/Plan: 1. Functional deficits secondary to  Right Cardioembolic CVA  which require 3+ hours per day of interdisciplinary therapy in a comprehensive inpatient rehab setting. Physiatrist is providing close team supervision and 24 hour management of active medical problems listed below. Physiatrist and rehab team continue to assess barriers to discharge/monitor patient progress toward functional and medical goals.  2.  Anticoagulation/DVT prophylaxis with Pharmaceutical: Coumadin .    3. Pain Management: Monitor for now for OA symptoms.  Patient's other  Active Hospital Problem List: DIABETES MELLITUS, TYPE II    Assessment: blood sugars 120-150 range generally   Plan: The current medical regimen is effective;  continue present plan and medications.   HYPERLIPIDEMIA   Assessment: stable   Plan: The current medical regimen is effective;   continue present plan and medications.   DEPRESSION    Assessment: flat and seems disinterested.Post CVA depression ritalin trial   Plan: Started on celexa, continue same.  Ego support by team.    NEUROPATHY, IDIOPATHIC PERIPHERAL NOS   Assessment: Pain BLE especially with increased activity per patient   Plan: Continue neurontin  ANEMIA    Assessment: stable   Plan: Anemia panel with low iron stores.  Continue iron supplement.  Will monitor H and H for now.     HYPERTENSION  BP Readings from Last 3 Encounters:  05/13/11 122/88  04/26/11 132/85  04/26/11 132/85     Assessment: stable.  Monitor for trends.   Plan: Continue losartan, metoprolol, furosemide  IRETON,SUSAN C 05/13/2011, 10:06 AM     Patient personally seen and examined by me. Agree as here.  Gwendolyn Grant, MD 10:13 AM

## 2011-05-13 NOTE — Progress Notes (Signed)
Patient, very pleasant and cooperative with staff. Requires 2 people with transfers with the slide board due to flaccid R side. Wearing eye patch today as ordered. Incont of stool x1 in breif, before she had known that she was incont, she asked for a bedpan but it was too late. Dsg to L great toe changed, see doc flowsheets. No unsafe issues noted. No new complaints, continue with plan of care. Cordelia Bessinger, Dione Plover

## 2011-05-14 DIAGNOSIS — I6330 Cerebral infarction due to thrombosis of unspecified cerebral artery: Secondary | ICD-10-CM

## 2011-05-14 DIAGNOSIS — Z5189 Encounter for other specified aftercare: Secondary | ICD-10-CM

## 2011-05-14 DIAGNOSIS — G8110 Spastic hemiplegia affecting unspecified side: Secondary | ICD-10-CM

## 2011-05-14 LAB — GLUCOSE, CAPILLARY
Glucose-Capillary: 105 mg/dL — ABNORMAL HIGH (ref 70–99)
Glucose-Capillary: 111 mg/dL — ABNORMAL HIGH (ref 70–99)

## 2011-05-14 LAB — PROTIME-INR
INR: 2.6 — ABNORMAL HIGH (ref 0.00–1.49)
Prothrombin Time: 28.3 seconds — ABNORMAL HIGH (ref 11.6–15.2)

## 2011-05-14 NOTE — Progress Notes (Signed)
Occupational Therapy Session Note  Patient Details  Name: [name] MRN: [id number] Date of Birth: [birth year]  Today's Date: 05/14/2011 Time: 0730-0827 Time Calculation (min): 57 min  Precautions: Precautions Precautions: Fall Precaution Comments: Fall Required Braces or Orthoses:  (Left PRAFO in bed) Restrictions Weight Bearing Restrictions: No Other Position/Activity Restrictions: diplopia with patch wearing schedule, homonimous hemianopsia, left hemiplagia, apraxia, decreased sustained attention.  Short Term Goals: OT Short Term Goal 1: Pt will complete bathing with mod assist OT Short Term Goal 1 - Progress: Met OT Short Term Goal 2: Pt will complete UB dressing with mod assist OT Short Term Goal 2 - Progress: Met OT Short Term Goal 3: Pt will complete toilet transfer with max assist using squat pivot technique OT Short Term Goal 3 - Progress: Revised (modified due to lack of progress/goal met) OT Short Term Goal 4: Pt will complete grooming in midline seated position at sink with min cues to scan to obtain items OT Short Term Goal 4 - Progress: Met  Skilled Therapeutic Interventions/Progress Updates:    Pt seen for ADL retraining at EOB with focus on dynamic sitting balance especially with washing feet and donning socks.  Pt demonstrated increased ability to complete LB bathing due to increased dynamic sitting balance.  Pt required steadying assist at Lt side and min cues to correct balance if she began to lose it to her Lt -- pt able to correct when leaning too far to side.  Pt demonstrated increased attention to task and problem solving with the ability to don Rt sock after many attempts.  Weight shifting in standing to promote more accurate midline static standing to assist with pulling up pants. Pt more active in each step of dressing this session.  Pain Pain Assessment Pain Assessment: No/denies pain ADL ADL Comments: Improved postural control, improved dynamic sitting  balance, improved sit to stand and static stand tolerance See FIM for further ADL information  Therapy/Group: Individual Therapy  [name] 05/14/2011, 9:43 AM

## 2011-05-14 NOTE — Progress Notes (Signed)
Speech Language Pathology Therapy Note [facility] Note  Patient Details  Name: Zona Michalik MRN: [id number] Date of Birth: 05-08-1961  Today's Date: 05/14/2011 Time: [id number] Time Calculation (min): 15 min  Precautions: Precautions Precautions: Fall Precaution Comments: Fall Required Braces or Orthoses:  (Left PRAFO in bed) Restrictions Weight Bearing Restrictions: No Other Position/Activity Restrictions: diplopia with patch wearing schedule, homonimous hemianopsia, left hemiplagia, apraxia, decreased sustained attention.  Therapeutic Intervention: Treatment focus on utilization of swallowing strategies with Dys. 2 textures and thin liquids. Pt needed Min A verbal cues to clear left anterior spillage and residuals in left pocket. No overt s/s of aspiration.  Therapy/Group: Dysphagia Group  Kecia Swoboda 05/14/2011 12:09 PM

## 2011-05-14 NOTE — Progress Notes (Signed)
Progress Notes  Speech Language Pathology Therapy Note & Weekly Progress Note  Patient Details  Name: [name] MRN: [id number] Date of Birth: December 13, 1960  Today's Date: 05/14/2011 Time: 0850-0930 Time Calculation (min): 40 min  Precautions: Precautions Precautions: Fall Precaution Comments: Fall Required Braces or Orthoses:  (Left PRAFO in bed) Restrictions Weight Bearing Restrictions: No Other Position/Activity Restrictions: diplopia with patch wearing schedule, homonimous hemianopsia, left hemiplagia, apraxia, decreased sustained attention.  Skilled Therapeutic Interventions: Session focused on selective attention and problem solving.  Patient sitting at table in her room with visual scanning matching task.  SLP facilitated with eye patch over right eye, limiting visual field (cards with 4 varying shapes) and providing contrast.  Patient also required hand over hand assist to use finger an assistive device to aid visual scanning.  Overall, max assist to id shapes on cards and determine if they were the same of not.  Patient requested a cup of ice and consumed it during session with moderate assist semantic cues to request help with locating cup as needed and without overt s/s of aspiration.    Short Term Goals:  1. Patient will demonstrate problem solving with moderate assist semantic cues with BADLs. (not met) 2. Patient will demonstrate selective attention to BADL/task for 10-12 minutes with moderate semantic cues. (met) 3. Patient will request help as needed with moderate semantic cues. (met) 4. Patient will consume Dys.2 texures without observed clinical signs of aspiration with: Minimal assistance; semantic cues. (met) 5. Patient will recall and utilize recommended strategies during swallow to increase swallowing safety with: Minimal assistance; semantic cues. (met)  New Short Term Goals: set 05/14/11 1. Patient will demonstrate problem solving with moderate assist semantic cues  with BADLs.  2. Patient will demonstrate selective attention to BADL/task for 20 minutes with supervision semantic cues.  3. Patient will request help as needed with minimal semantic cues.  4. Patient will consume Dys.2 texures without observed clinical signs of aspiration with: supervision assistance; semantic cues.  5. Patient will recall and utilize recommended strategies during swallow to increase swallowing safety with: supervision assistance; semantic cues.   Progress Updates: Patient met 4 out of 5 short term goals this week and has made gains in her ability to attend with mild environmental distractions and consume  Dys.2 textures with minimal assist semantic cues to manage left oral pocketing and use compensatory strategies such as mirror, lingual and manual sweep with meals.  Patient has also made gains in recognizing and beginning to request help as needed with tasks.  Patient with limited gains in problem solving due to visual and sensory deficits as well as decreased awareness and self monitoring.  For example, in previous session, patient unable to sense oral pocketing in left buccal cavity and upon performing oral care patient with no awareness or self monitoring of brushing external cheek instead of inside oral cavity.  Patient's verbal skills continue to outweigh her functional problem solving skills and would continue to benefit from skilled SLP services to address deficits discussed as well as to consume Dys.3 trails and assess potential for diet advancement in group and individual treament sessions.   Precautions/Restrictions  Restrictions Weight Bearing Restrictions: No   Pain Pain Assessment Pain Assessment: No/denies pain Pain Score: 0-No pain  Oral/Motor: Oral Motor/Sensory Function Labial ROM: Reduced left Labial Symmetry: Abnormal symmetry left Labial Strength: Reduced Labial Sensation: Reduced Lingual ROM: Reduced left Lingual Symmetry: Abnormal symmetry  left Lingual Strength: Reduced Lingual Sensation: Reduced Facial ROM: Reduced left Facial  Symmetry: Left drooping eyelid Facial Strength: Reduced Facial Sensation: Reduced Velum:  (NT) Mandible: Within Functional Limits Motor Speech Intelligibility: Intelligibility reduced Comprehension: Auditory Comprehension Yes/No Questions: Impaired Basic Immediate Environment Questions: 25-49% accurate Commands: Impaired Two Step Basic Commands: 50-74% accurate Multistep Basic Commands: 0-24% accurate Other Conversation Comments: basic/simple conversation WFL Interfering Components: Attention;Processing speed EffectiveTechniques: Extra processing time;Repetition;Visual/Gestural cues Visual Recognition/Discrimination Discrimination: Exceptions to [facility] Common Objects: Able in field of 2 Pictures: Not tested Reading Comprehension Reading Status: Not tested Expression: Expression Primary Mode of Expression: Verbal Verbal Expression Initiation: Impaired (mild-moderate impariment) Level of Generative/Spontaneous Verbalization: Conversation (mild-moderately impaired) Naming: Impairment Responsive: 76-100% accurate Confrontation: Within functional limits (basic items) Common Objects: Able in field of 2 Pictures: Not tested Convergent: Not tested Divergent: Not tested Other Naming Comments: paraphasias present appear to be baseline Verbal Errors: Phonemic paraphasias;Perseveration;Not aware of errors Pragmatics: Impairment Impairments: Abnormal affect;Eye contact;Monotone Interfering Components: Attention;Premorbid deficit Effective Techniques: Semantic cues Written Expression Dominant Hand: Right Written Expression: Not tested  Therapy/Group: Individual Therapy  Carmelia Roller., CCC-SLP [id number] Renn[place] 05/14/2011 11:55 AM

## 2011-05-14 NOTE — Progress Notes (Signed)
Physical Therapy Session Note  Patient Details  Name: [name] MRN: [id number] Date of Birth: 15-Feb-1961  Today's Date: 05/14/2011 Time: 1000-1055 Time Calculation (min): 55 min  Precautions: Precautions Precautions: Fall Precaution Comments: Fall Required Braces or Orthoses:  (Left PRAFO in bed) Restrictions Weight Bearing Restrictions: No Other Position/Activity Restrictions: diplopia with patch wearing schedule, homonimous hemianopsia, left hemiplagia, apraxia, decreased sustained attention.  Short Term Goals: PT Short Term Goal 1: Pt will roll left with max assist, roll right with mod assist. PT Short Term Goal 2: Pt will supine-sit with max assist. PT Short Term Goal 3: Pt will transfer with max assist. PT Short Term Goal 3 - Progress: Met PT Short Term Goal 4: W/C mobility x 50 feet mod assist in controlled environment. PT Short Term Goal 5: Tolerate standing > 3 min with mechanical lift vs. total assist +2 to initiate weight bearing and pregait activities. PT Short Term Goal 5 - Progress: Met  Skilled Therapeutic Interventions/Progress Updates: Focus of treatment: 1. Transfer training 2. Neuro reed Left LE 3. Standing to EVA walker focusing on trunk extension control and weight shifts 4. Stepping using Eva walker and MaxiSky to facilitate Left knee extension in stance.     General Pt. Up in wc      Pain Pain Assessment Pain Assessment: No/denies pain Mobility transfer training: Pt performs scooting/lateral transfers to wc using sliding board mod assist to right/mod-max to left + setup with assist of second person for safety  Bed mobility: pt able to roll to left on mat when right hip placed in hooklying position min assist; roll to right placing Left hip in hooklying and max cues to assist Left UE min /mod assist Locomotion Pt. Ambulated 15 feet X 2 using MaxiSky lift and Eva walker with total assist of 2 to advance Lt LE and advance AD; pt intermittently attempted to  advance Left LE with ace wrap on ankle but continues to lean to left with flexed knee in stance   Standing : pt stood from mat to back of wc mod assist with improved trunk extension control in stance /blocking Left knee in extension X 2 for approximately 2 - 3 minutes         Exercises Neuro reed Left LE : pt able to partially flex left hip in supine using therapy ball    Other Treatments    Therapy/Group: Individual Therapy  Myrl Bynum,JIM 05/14/2011, 10:57 AM

## 2011-05-14 NOTE — Progress Notes (Signed)
Patient is alert and oriented this shift. She has denied any pain or discomfort this shift. Continue current plan of care.

## 2011-05-14 NOTE — Progress Notes (Signed)
Occupational Therapy Note [facility]  Patient Details  Name: [name] MRN: [id number] Date of Birth: 08-20-1960 Today's Date: 05/14/2011  Treatment Time: 1130-1145 Focus on set up of tray and using LUE to hold items with hand over hand assist and open item with RUE.  Min cues for pt to use mirror to check for food on Lt side of mouth.  [name] 05/14/2011, 12:08 PM

## 2011-05-14 NOTE — Progress Notes (Signed)
ANTICOAGULATION CONSULT NOTE - Follow Up Consult  Pharmacy Consult for Coumadin Indication: CVA  Allergies  Allergen Reactions  . Ace Inhibitors Shortness Of Breath  . Vancomycin Other (See Comments)    Unknown     Vital Signs: Temp: 99.4 F (37.4 C) (12/03 0530) Temp src: Axillary (12/03 0530) BP: 118/73 mmHg (12/03 0530) Pulse Rate: 87  (12/03 0530)  Labs:  Basename 05/14/11 0620 05/12/11 1100  HGB -- --  HCT -- --  PLT -- --  APTT -- --  LABPROT 28.3* 25.4*  INR 2.60* 2.27*  HEPARINUNFRC -- --  CREATININE -- --  CKTOTAL -- --  CKMB -- --  TROPONINI -- --   Estimated Creatinine Clearance: 82.1 ml/min (by C-G formula based on Cr of 1.1).   Medications:  Scheduled:     . amitriptyline  50 mg Oral QHS  . antiseptic oral rinse  15 mL Mouth Rinse BID  . citalopram  10 mg Oral QHS  . cycloSPORINE  1 drop Both Eyes BID  . ferrous sulfate  325 mg Oral BID  . furosemide  20 mg Oral Daily  . gabapentin  600 mg Oral QID  . glimepiride  2 mg Oral Q breakfast  . insulin aspart  0-15 Units Subcutaneous TID WC  . insulin aspart  0-5 Units Subcutaneous QHS  . losartan  100 mg Oral Daily  . methylphenidate  10 mg Oral BID WC  . metoprolol  75 mg Oral BID  . mupirocin ointment  1 application Topical BID  . simvastatin  20 mg Oral q1800  . warfarin  2.5 mg Oral Custom  . warfarin  5 mg Oral Custom   Assessment:  [name] with recent stroke on Coumadin.  INR therapeutic.  No bleeding noted.   Goal of Therapy:  INR 2-3   Plan:  1. Continue coumadin 2.5 daily except 5 mg TuF. 2. INR  MWF.   [name] 05/14/2011,12:23 PM

## 2011-05-14 NOTE — Progress Notes (Signed)
Patient ID: [name], female   DOB: Nov 06, 1960, 50 y.o.   MRN: [id number] Subjective/Complaints: No new complaints, feels ok no problems in PT/OT yesterday.  Comfortable night. Review of Systems  Neurological: Positive for sensory change and focal weakness.  All other systems reviewed and are negative.   Objective: Vital Signs: Blood pressure 118/73, pulse 87, temperature 99.4 F (37.4 C), temperature source Axillary, resp. rate 18, height 5\' 7"  (1.702 m), weight 115.577 kg (254 lb 12.8 oz), SpO2 95.00%. No results found. No results found for this basename: WBC:2,HGB:2,HCT:2,PLT:2 in the last 72 hours CBG (last 3)   Basename 05/13/11 2004 05/13/11 1716 05/13/11 1648  GLUCAP 104* 89 67*   Results for orders placed during the hospital encounter of 04/26/11 (from the past 24 hour(s))  GLUCOSE, CAPILLARY     Status: Abnormal   Collection Time   05/13/11  8:11 AM      Component Value Range   Glucose-Capillary 110 (*) 70 - 99 (mg/dL)   Comment 1 Notify RN    GLUCOSE, CAPILLARY     Status: Abnormal   Collection Time   05/13/11 11:51 AM      Component Value Range   Glucose-Capillary 156 (*) 70 - 99 (mg/dL)   Comment 1 Notify RN    GLUCOSE, CAPILLARY     Status: Abnormal   Collection Time   05/13/11  4:48 PM      Component Value Range   Glucose-Capillary 67 (*) 70 - 99 (mg/dL)   Comment 1 Notify RN    GLUCOSE, CAPILLARY     Status: Normal   Collection Time   05/13/11  5:16 PM      Component Value Range   Glucose-Capillary 89  70 - 99 (mg/dL)   Comment 1 Notify RN    GLUCOSE, CAPILLARY     Status: Abnormal   Collection Time   05/13/11  8:04 PM      Component Value Range   Glucose-Capillary 104 (*) 70 - 99 (mg/dL)   Comment 1 Notify RN      No results found for this basename: NA:2,K:2,CL:2,CO2:2,GLUCOSE:2,BUN:2,CREATININE:2,CALCIUM:2 in the last 72 hours BP Readings from Last 3 Encounters:  05/14/11 118/73  04/26/11 132/85  04/26/11 132/85   INR  Date Value Range Status    05/12/2011 2.27* 0.00-1.49 (no units) Final      Physical Exam: General appearance: morbidly obese, flat affect. Resp: clear to auscultation bilaterally Cardio: regular rate and rhythm GI: soft, non-tender; bowel sounds normal; no masses,  no organomegaly Extremities: no edema, redness or tenderness in the calves or thighs Pulses: 2+ and symmetric Skin: Skin color, texture, turgor normal. No rashes or lesions Neurologic: Gait: working on pregait activities. Alert and oriented X 3, flat affect, left inattention, left hemiparesis UE(0/5)>LE except 2- L triceps.  Left hemisensory deficits plus insensate feet bilateral .  Poor awareness.  LLE has 2-/5 knee/hip extensor synergy . Assessment/Plan: 1. Functional deficits secondary to  Right Cardioembolic CVA  which require 3+ hours per day of interdisciplinary therapy in a comprehensive inpatient rehab setting. Physiatrist is providing close team supervision and 24 hour management of active medical problems listed below. Physiatrist and rehab team continue to assess barriers to discharge/monitor patient progress toward functional and medical goals. 2.  Diabetes- stable 3.  Anemia- unchanged 4.  HTN- controlled Mobility: Bed Mobility Bed Mobility: No Rolling Right: 2: Max assist Rolling Left: 2: Max assist;With rail Rolling Left Details (indicate cue type and reason): Assist to flex left  leg, and cues to maintain flexion in right leg.  Utilized bed rail with tactile cue to locate Supine to Sit: 2: Max assist Sit to Supine - Left: 1: +1 Total assist Transfers Transfers: Yes Sit to Stand: 1: +2 Total assist;From elevated surface Sit to Stand Details (indicate cue type and reason): Sit to stand with +2A (bilat UE support around therapists) but patient performing >50% of sit to stand with cues for anterior lean and R foot placement; manual facilitation for  L foot and LE positioning Stand to Sit: 1: +2 Total assist Stand to Sit Details: cues  for anterior lean and controlled lowering to seat  Lateral/Scoot Transfers: 1: +2 Total assist;With slide board Lateral/Scoot Transfer Details (indicate cue type and reason): Total assit for board placement; Min A for pt to lean to left side for board placement. Ambulation/Gait Ambulation/Gait Assistance: 1: +2 Total assist Stairs: No (unsafe to attempt at this time, unable to achive standing) Wheelchair Mobility Wheelchair Mobility: Yes Distance: 150 ADL:   Cognition: Cognition Overall Cognitive Status: Impaired Arousal/Alertness: Awake/alert Orientation Level: Oriented to person;Oriented to place Attention: Sustained Sustained Attention: Impaired Sustained Attention Impairment: Functional basic Memory: Impaired Memory Impairment: Retrieval deficit Awareness: Impaired Awareness Impairment: Intellectual impairment Problem Solving: Impaired Problem Solving Impairment: Functional basic;Verbal basic Behaviors: Perseveration;Other (comment) (flat affect) Safety/Judgment: Impaired Comments: Moves very quickly at times, impulsive Cognition Arousal/Alertness: Awake/alert Orientation Level: Oriented to person;Oriented to place  2. Anticoagulation/DVT prophylaxis with Pharmaceutical: Coumdin .  Continue aspirin and Lovenox  till INR therapeutic.  3. Pain Management: Monitor for now for OA symptoms.  Patient Active Hospital Problem List: DIABETES MELLITUS, TYPE II (01/03/2007)   Assessment: blood sugars 120-150 range generally   Plan:Cont.  Amaryl 2mg l. Monitor over next few days. HYPERLIPIDEMIA (01/03/2007)   Assessment:    Plan: continue zocor DEPRESSION (01/03/2007)   Assessment: flat and seems disinterested.Post CVA depression ritalin trial   Plan: Started on celexa.  Ego support by team.   NEUROPATHY, IDIOPATHIC PERIPHERAL NOS (01/07/2007)   Assessment: Pain BLE especially with increased activity per patient   Plan: Continue neurontin but increase to QID ANEMIA (07/07/2010)    Assessment: ?etiology.    Plan: Anemia panel with low iron stores.  Continue iron supplement.  Will monitor H and H for now.  Check stool guaiacs.   HYPERTENSION (07/07/2010)   Assessment:  systolic's resonalble.  Monitor for trends.   Plan: Continue losartan, metoprolol, furosemide Cardiomyopathy (04/25/2011)    POA: Unknown   Assessment: EF @ 20-25%.  Likely contributing to SOB with activity.   Plan: Monitor for symptoms.  Monitor for signs of overload.  Will add low salt and CM restrictions. Dysphagia:  On DI diet due to pocketing and impulsivity.  Alysia Penna E 05/14/2011, 7:15 AM

## 2011-05-15 DIAGNOSIS — G8110 Spastic hemiplegia affecting unspecified side: Secondary | ICD-10-CM

## 2011-05-15 DIAGNOSIS — Z5189 Encounter for other specified aftercare: Secondary | ICD-10-CM

## 2011-05-15 DIAGNOSIS — I6330 Cerebral infarction due to thrombosis of unspecified cerebral artery: Secondary | ICD-10-CM

## 2011-05-15 LAB — GLUCOSE, CAPILLARY
Glucose-Capillary: 142 mg/dL — ABNORMAL HIGH (ref 70–99)
Glucose-Capillary: 70 mg/dL (ref 70–99)
Glucose-Capillary: 109 mg/dL — ABNORMAL HIGH (ref 70–99)

## 2011-05-15 NOTE — Discharge Summary (Signed)
[name], [name] NO.:  [id number]  MEDICAL RECORD NO.:  [id number]  LOCATION:  [id number]                         FACILITY:  [facility]  PHYSICIAN:  Charlett Blake, M.D.DATE OF BIRTH:  11-16-1960  DATE OF ADMISSION:  04/26/2011 DATE OF DISCHARGE:  05/16/2011                              DISCHARGE SUMMARY   HISTORY OF PRESENT ILLNESS:  [name] is a 50 year old female with a history of hypertension, diabetes mellitus, CVA in November 2011, with left-sided weakness and mild aphasia residually, admitted in November 2011, with slurred speech and a slight right facial droop.  The patient with worsening of left-sided weakness past admission.  CT of head done showed no acute changes.  MRI of brain done showed acute large right MCA infarct affecting insular cortex, posterior frontal cortex, superior temporal cortex, and underlying white matter.  MRA without stenosis.  A 2D echo done showed EF of 30% to 35%, with moderate-to- severe hypokinesis.  Carotid Dopplers done showed no ICA stenosis.  The patient was evaluated by Neurology who recommended TEE as the patient with significant decrease in EF.  Hypercoagulopathy and vasculitis panel were ordered.  The patient has had outpatient cardiac telemetry which was negative for PAF.  TEE done revealed moderate LVH with severe global hypokinesis with EF of 20% to 25%, biatrial enlargement with severe stasis, moderate-to-severe MR, no PFO and severe atherosclerosis of the aortic arch.  Cardiology recommends low-dose diuretic due to decreased EF and no Ace due to history of shortness of breath with lisinopril. Neurology recommends Coumadin given TEE findings, and as the patient with 2 embolic strokes within a year.  Currently, the patient continues with dense left hemiplegia with hemisensory deficits.  She is noted to have dense left homonymous hemianopsia with mild left facial weakness and dysarthria with a right gaze  preference.  She is on a puree diet with thin liquids due to severe pocketing on the left and impulsivity at meals.  The family reported bloody purulent drainage from the left great toe, and  WOC recommended Podiatry consult for nail debridement in the future as well as local care with Betadine swabs.  Therapies were initiated  and are currently working on pre-gait activity.  Currently, the patient requires max cues for sequencing with decreased awareness of deficits.  The patient was evaluated by rehab and we felt that she would benefit from a CIR program.  REVIEW OF SYSTEMS:  Positive for ear pain, blurred vision, double vision, frequency, joint pains, as well as focal weakness with headaches.  Also noted to have depressed mood.  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  PAST SURGICAL HISTORY:  Positive for tympanoplasty, left second toe amputation, D and C, and laser surgery for retinopathy.  ALLERGIES:  VANCOMYCIN AND LISINOPRIL.  FAMILY HISTORY:  Positive for coronary artery disease and diabetes.  SOCIAL  HISTORY:  The patient lives with a sister and was independent for ADLs and mobility.  She reports she has never smoked, does not use smokeless tobacco.  Drinks alcohol occasionally.  Does not use any illicit drugs.  FUNCTIONAL HISTORY:  The patient required assisted device prior to admission.  Needed assist for transfers from low surfaces.  Required minimal assistance for bathing.  Functional status, the patient is total assist for feeding.  She is +1, 20% to 35%  for bed mobility.  She is +2 total assist, 35% to sit at the edge of bed with head of bed raised to 45 degrees.  She is +2 total assist 20% for sit to supine.  PHYSICAL EXAMINATION:  GENERAL:  The patient is an obese female with a left facial droop, flat affect, and oriented x3. HEENT:  Oral mucosa with white coating and food residue on tongue.  Poor dentition noted.  Eyes, unable to cross over to left field. NECK:  Supple with normal range of motion. HEART:  Regular rate rhythm. LUNGS:  Normal effort and lungs sounds are clear. ABDOMEN:  Soft and nontender.  Positive bowel sounds. NEUROLOGIC:  The patient is alert and oriented x3.  She is noted to have left facial weakness with dysarthric speech with increased rate.  Left homonymous hemianopsia, blurred double vision reported.  Decreased sensation left greater than right.  Dense left upper extremity paresis, right gaze preference, decreased awareness.  Able to correct state and month with minimal cues.  Unable to state year with max cues. SKIN:  Warm and dry. EXTREMITIES:  Left great toe with purulent drainage when depressed. PSYCHIATRIC:  Apathy noted.  HOSPITAL COURSE:  [name] was admitted to rehab on April 26, 2011, for inpatient therapies to consist of PT/OT and speech therapy at least 3 hours for 5 days a week.  Past admission, physiatrist, rehab, RN, and therapy team have worked together to provide customized collaborative interdisciplinary care.  Rehab RN has worked with the patient on bowel and bladder program, management as well as safety wound care and pain management issues to help integrate therapy concepts techniques as well as education.  The patient was monitored with b.i.d. blood pressure checks.  These have been reasonable ranging from 0000000 systolics, [id number] diastolic.  The patient's diabetes was monitored with a.c. and bedtime CBG checks, and these have ranged from 60s-140s range. Tight control was  not attempted to prevent hypoglycemic episodes.  The patient was maintained on Coumadin for CVA prophylaxis throughout her Stay. Protime / INR at discharge is at 27.5/2.51  Labs done on the past admission revealed the patient with renal insufficiency with BUN at 34, creatinine at 1.44.  She was also noted to be anemic with H and H at 8.8 and 28.6.  She was noted to have low iron stores, therefore iron supplements was added.  Recheck CBC of May 08, 2011, revealed improvement with hemoglobin at 10.3, hematocrit 33.3, white count 4.3, and platelets 436.  Recheck of renal status done on May 11, 2011, shows resolution of renal insufficiency with sodium 141, potassium 4.8, chloride 107, CO2 26, BUN 21, creatinine 1.1, and glucose 76.  Podiatry followed up on the patient for treatment of her infected left great toe.  This was incised and drained.  X-rays of the left toe done showed no evidence of osteomyelitis.  They also ordered ABIs that were performed on May 01, 2011, showing noncompressible vessels bilaterally and poor circulation, left great toe.  A question  of angiogram was brought up.  He felt that as the patient is not a bypass candidate, angiogram would be more useful as a preoperative planning tool.  ABI is greater than 0.6 predicting success after toe amputation. Surgery is not indicated currently and patient to follow up with Dr. Bridgett Larsson past Discharge.  The patient did report depressed mood at the time of admission, and she was started on Celexa for mood stabilization.  She reported  bilateral lower extremity pain, especially with activity and Neurontin was increased to q.i.d. basis.  During the patient's stay in the rehab, weekly team conferences were held to monitor the patient's progress, set goals, as well as discuss barriers to discharge.  At the time of admission, the patient was limited by her dense left hemiplegia with diplopia, homonomous hemianopsia, and  motor apraxia.  She was also noted to have decreased sustained left-sided activation with absent balance reaction, poor graded weight shifting in all planes, decreased postural control frequently collapsing to the left with decreased bilateral hip and left ankle range of motion.  The patient was total assist for mobility due to all of these deficits.  A left PR  AFO was ordered to use in bed to help prevent left ankle contracture.  PT has been ongoing and currently the patient is able to perform scooting lateral transfers to wheelchair using sliding board with mod assist to right, mod to max assist to left, with setup and second person assist for safety.  She is able to roll on mat with right hip placed in hooking position with min assist.  Able to roll to left in hooking with max cues and assist of min to mod assist with assist of left upper extremity.  She is able to ambulate 15 feet x2 using Maxi Sky and Ethelene Hal with +2 assist to advance left lower extremity and for assistive device.  She intermittently attempting to advance left lower extremity with Ace wrap on ankle, but continued to lean to the left with flexed knee in stance.  She was able to stand from mat to back of wheelchair with mod assist with improved truncal extension control in stance with blocking of left knee for approximately 2-3 minutes.  OT has worked with the patient on self-care tasks.  They have focused on utilization of left upper extremity for grooming as well as ADL tasks.  They have focused on personal care at bed level and upper and lower body bathing while sitting at the edge of the bed.  Currently, the patient is +2 assist for hygiene, +2 assist for transfers to bedside commode, +2 assist to pull up pants with focus on sit to stand and upright balance.  She is demonstrating increased ability to maintain dynamic sitting balance for washing bilateral lower extremity as well as donning her right shoe and  sock.  Speech therapy has been ongoing with focus on improving sustained attention as well as improving problem solving and dysphagia treatment.  Currently, the patient is on D2 diet without any signs or symptoms of infection.  She requires min verbal cues to clear left anterior spillage and residual in the left cheek. She requires hand over hand assist to use finger as an assisted device for visual scanning to the left.  She is max assist to ID shapes on cards and to determine if these were identical.  She requires mod assist with semantic cues, explained procedures in a card game, and count hand at the end.  Currently, the  patient continues to require significant assist and family has elected on progressive therapies at SNF level. Bed is available at [facility] for May 16, 2011, and the patient is to be discharged to this facility with progressive PT/OT, speech therapy to continue past discharge.  DISCHARGE MEDICATIONS:  Elavil 50 mg p.o. at bedtime, biotin rinse 15 mL b.i.d. swish and spit, Celexa 10 mg p.o. at bedtime, Restasis 0.05% 1 GT OU b.i.d., ferrous sulfate 325 mg b.i.d., Lasix 20 mg p.o. per day, Neurontin 600 mg p.o. q.i.d., Amaryl 2 mg p.o. per day, Cozaar 100 mg p.o. per day, Ritalin 10 mg p.o. at 07:00 a.m. and noon daily, metoprolol 75 mg p.o. b.i.d., Zocor 20 mg p.o. at bedtime, Coumadin 2.5 mg on Monday, Wednesday, Thursday, Friday, and Sunday, and 5 mg on Tuesdays and Saturdays, Tylenol 650 mg p.o. q.4 h. p.r.n. pain, Robaxin 500 mg p.o. q.6 h. p.r.n. spasms, and Maalox Plus 30 mL p.o. q.i.d. p.r.n. indigestion.  DIET INSTRUCTIONS:  Carb modified medium, D2 diet thin liquids with supervision at meals.  SPECIAL INSTRUCTIONS:  Routine pressure relief measures.  Continue to monitor the left toe for any recurrent drainage.  CBG checks a.c. at bedtime with sliding scale insulin per protocol.  Toilet patient cue 3-4 hours while awake.  Progressive PT/OT, speech  therapy to continue past discharge.  FOLLOWUP:  The patient is to follow up with Dr. Alysia Penna on 06/19/11 at  1:30 pm.  Follow up with Dr. Haroldine Laws in the next 2 weeks.  Follow up with Dr. Leonie Man in 4-6 weeks.  Follow up with Dr. Adele Barthel for routine check and input on left great toe.     Reesa Chew, P.A.   ______________________________ Charlett Blake, M.D.    PL/MEDQ  D:  05/15/2011  T:  05/15/2011  Job:  [id number]  cc:   Pramod P. Leonie Man, MD Shaune Pascal. Bensimhon, MD Ishmael Holter. Sarajane Jews, MD Conrad Plush, MD

## 2011-05-15 NOTE — Progress Notes (Signed)
Physical Therapy Session Note  Patient Details  Name: [name] MRN: [id number] Date of Birth: [birth year]  Today's Date: 05/15/2011 Time: 1545-1600 Time Calculation (min): 15 min  Precautions: Precautions Precautions: Fall Precaution Comments: Fall Required Braces or Orthoses:  (Left PRAFO in bed) Restrictions Weight Bearing Restrictions: No Other Position/Activity Restrictions: diplopia with patch wearing schedule, homonimous hemianopsia, left hemiplegia, apraxia, decreased sustained attention  Short Term Goals: PT Short Term Goal 1: Pt will roll left with max assist, roll right with mod assist. PT Short Term Goal 2: Pt will supine-sit with max assist. PT Short Term Goal 3: Pt will transfer with max assist. PT Short Term Goal 3 - Progress: Met PT Short Term Goal 4: W/C mobility x 50 feet mod assist in controlled environment. PT Short Term Goal 5: Tolerate standing > 3 min with mechanical lift vs. total assist +2 to initiate weight bearing and pregait activities. PT Short Term Goal 5 - Progress: Met  Skilled Therapeutic Interventions/Progress Updates:       Pain Pain Assessment Pain Assessment: No/denies pain Pain Score: 0-No pain      Other Treatments   Pt still supine in bed upon entry. Declined OOB with PT despite max encouragement provided. Discussed continued participation with PT in SNF setting, positioning in bed, continued use of PRAFO to prevent left foot contracture, and pressure relief. Pt verbalized agreement. Short term goals met at this time, however long term goals for transfers, balance, and gait not met. Will continue to benefit from PT in SNF setting to further progress functional mobility to increase independence and decrease burden of care for discharge home.  Therapy/Group: Individual Therapy  Kendrick Ranch 05/15/2011, 4:27 PM

## 2011-05-15 NOTE — Progress Notes (Signed)
Occupational Therapy Session Note  Patient Details  Name: [name] MRN: [id number] Date of Birth: November 24, 1960  Today's Date: 05/15/2011 Time: 1130-1140 Time Calculation (min): 10 min  Precautions: Precautions Precautions: Fall Precaution Comments: Fall Required Braces or Orthoses:  (Left PRAFO in bed) Restrictions Weight Bearing Restrictions: No Other Position/Activity Restrictions: diplopia with patch wearing schedule, homonimous hemianopsia, left hemiplagia, apraxia, decreased sustained attention.  Short Term Goals: OT Short Term Goal 1: Pt will complete bathing with mod assist OT Short Term Goal 1 - Progress: Met OT Short Term Goal 2: Pt will complete UB dressing with mod assist OT Short Term Goal 2 - Progress: Met OT Short Term Goal 3: Pt will complete toilet transfer with max assist using squat pivot technique OT Short Term Goal 3 - Progress: Revised (modified due to lack of progress/goal met) OT Short Term Goal 4: Pt will complete grooming in midline seated position at sink with min cues to scan to obtain items OT Short Term Goal 4 - Progress: Met  Skilled Therapeutic Interventions/Progress Updates:    Pt engaged in self-feeding group with focus on increased attention to left, selective attention, and swallowing precautions.  Pt required min verbal cues to follow swallowing precautions and attend to left.  Pt engaged in conversation with other participants while engaged in eating activities  Pain Pain Assessment Pain Assessment: No/denies pain Pain Score: 0-No pain  Therapy/Group: Group Therapy  Leroy Libman 05/15/2011, 12:17 PM

## 2011-05-15 NOTE — Progress Notes (Signed)
Patient ID: [name], female   DOB: November 26, 1960, 50 y.o.   MRN: [id number] Patient ID: [name], female   DOB: 05/09/1961, 50 y.o.   MRN: [id number] Subjective/Complaints: No new complaints, feels ok no problems in PT/OT yesterday.  Comfortable night."I walked in PT" Review of Systems  Neurological: Positive for sensory change and focal weakness.  All other systems reviewed and are negative.   Objective: Vital Signs: Blood pressure 126/85, pulse 87, temperature 98.3 F (36.8 C), temperature source Oral, resp. rate 20, height 5\' 7"  (1.702 m), weight 115.577 kg (254 lb 12.8 oz), SpO2 96.00%. No results found. No results found for this basename: WBC:2,HGB:2,HCT:2,PLT:2 in the last 72 hours CBG (last 3)   Basename 05/15/11 [id number] 05/14/11 2136 05/14/11 1652  GLUCAP 87 111* 65*   Results for orders placed during the hospital encounter of 04/26/11 (from the past 24 hour(s))  GLUCOSE, CAPILLARY     Status: Abnormal   Collection Time   05/14/11  7:35 AM      Component Value Range   Glucose-Capillary 146 (*) 70 - 99 (mg/dL)   Comment 1 Notify RN    GLUCOSE, CAPILLARY     Status: Abnormal   Collection Time   05/14/11 11:20 AM      Component Value Range   Glucose-Capillary 105 (*) 70 - 99 (mg/dL)   Comment 1 Notify RN    GLUCOSE, CAPILLARY     Status: Abnormal   Collection Time   05/14/11  4:52 PM      Component Value Range   Glucose-Capillary 65 (*) 70 - 99 (mg/dL)   Comment 1 Notify RN    GLUCOSE, CAPILLARY     Status: Abnormal   Collection Time   05/14/11  9:36 PM      Component Value Range   Glucose-Capillary 111 (*) 70 - 99 (mg/dL)   Comment 1 Notify RN    GLUCOSE, CAPILLARY     Status: Normal   Collection Time   05/15/11  7:12 AM      Component Value Range   Glucose-Capillary 87  70 - 99 (mg/dL)   Comment 1 Notify RN      No results found for this basename: NA:2,K:2,CL:2,CO2:2,GLUCOSE:2,BUN:2,CREATININE:2,CALCIUM:2 in the last 72 hours BP Readings from Last 3 Encounters:    05/15/11 126/85  04/26/11 132/85  04/26/11 132/85   INR  Date Value Range Status  05/14/2011 2.60* 0.00-1.49 (no units) Final      Physical Exam: General appearance: morbidly obese, flat affect. Resp: clear to auscultation bilaterally Cardio: regular rate and rhythm GI: soft, non-tender; bowel sounds normal; no masses,  no organomegaly Extremities: no edema, redness or tenderness in the calves or thighs Pulses: 2+ and symmetric Skin: Skin color, texture, turgor normal. No rashes or lesions Neurologic: Gait: working on pregait activities. Alert and oriented X 3, flat affect, left inattention, left hemiparesis UE(0/5)>LE except 2- L triceps.  Left hemisensory deficits plus insensate feet bilateral .  Poor awareness.  LLE has 2-/5 knee/hip extensor synergy . Assessment/Plan: 1. Functional deficits secondary to  Right Cardioembolic CVA  which require 3+ hours per day of interdisciplinary therapy in a comprehensive inpatient rehab setting. Physiatrist is providing close team supervision and 24 hour management of active medical problems listed below. Physiatrist and rehab team continue to assess barriers to discharge/monitor patient progress toward functional and medical goals. 2.  Diabetes- stable 3.  Anemia- unchanged 4.  HTN- controlled Mobility: Bed Mobility Bed Mobility: No Rolling Right: 2: Max assist  Rolling Left: 2: Max assist;With rail Rolling Left Details (indicate cue type and reason): Assist to flex left leg, and cues to maintain flexion in right leg.  Utilized bed rail with tactile cue to locate Supine to Sit: 2: Max assist Sit to Supine - Left: 1: +1 Total assist Transfers Transfers: Yes Sit to Stand: 1: +2 Total assist;From elevated surface Sit to Stand Details (indicate cue type and reason): Sit to stand with +2A (bilat UE support around therapists) but patient performing >50% of sit to stand with cues for anterior lean and R foot placement; manual facilitation for  L  foot and LE positioning Stand to Sit: 1: +2 Total assist Stand to Sit Details: cues for anterior lean and controlled lowering to seat  Lateral/Scoot Transfers: 1: +2 Total assist;With slide board Lateral/Scoot Transfer Details (indicate cue type and reason): Total assit for board placement; Min A for pt to lean to left side for board placement. Ambulation/Gait Ambulation/Gait Assistance: 1: +2 Total assist Stairs: No (unsafe to attempt at this time, unable to achive standing) Wheelchair Mobility Wheelchair Mobility: Yes Distance: 150 ADL:   Cognition: Cognition Overall Cognitive Status: Impaired Arousal/Alertness: Awake/alert Orientation Level: Oriented to person;Oriented to place;Oriented to situation Attention: Sustained Sustained Attention: Impaired Sustained Attention Impairment: Functional basic Memory: Impaired Memory Impairment: Retrieval deficit Awareness: Impaired Awareness Impairment: Intellectual impairment Problem Solving: Impaired Problem Solving Impairment: Functional basic;Verbal basic Behaviors: Perseveration;Other (comment) (flat affect) Safety/Judgment: Impaired Comments: Moves very quickly at times, impulsive Cognition Arousal/Alertness: Awake/alert Orientation Level: Oriented to person;Oriented to place;Oriented to situation  2. Anticoagulation/DVT prophylaxis with Pharmaceutical: Coumdin .  Continue aspirin and Lovenox  till INR therapeutic.  3. Pain Management: Monitor for now for OA symptoms.  Patient Active Hospital Problem List: DIABETES MELLITUS, TYPE II (01/03/2007)   Assessment: blood sugars 120-150 range generally   Plan:Cont.  Amaryl 2mg l. Monitor over next few days. HYPERLIPIDEMIA (01/03/2007)   Assessment:    Plan: continue zocor DEPRESSION (01/03/2007)   Assessment: flat and seems disinterested.Post CVA depression ritalin trial   Plan: Started on celexa.  Ego support by team.   NEUROPATHY, IDIOPATHIC PERIPHERAL NOS (01/07/2007)    Assessment: Pain BLE especially with increased activity per patient   Plan: Continue neurontin but increase to QID ANEMIA (07/07/2010)   Assessment: ?etiology.    Plan: Anemia panel with low iron stores.  Continue iron supplement.  Will monitor H and H for now.  Check stool guaiacs.   HYPERTENSION (07/07/2010)   Assessment:  systolic's resonalble.  Monitor for trends.   Plan: Continue losartan, metoprolol, furosemide Cardiomyopathy (04/25/2011)      Assessment: EF @ 20-25%.  Likely contributing to SOB with activity.   Plan: Monitor for symptoms.  Monitor for signs of overload.  Will add low salt and CM restrictions. Dysphagia:  On DI diet due to pocketing and impulsivity.  Charlett Blake 05/15/2011, 7:28 AM

## 2011-05-15 NOTE — Progress Notes (Signed)
Physical Therapy Session Note  Patient Details  Name: Puanani Scharrer MRN: [id number] Date of Birth: August 23, 1960  Today's Date: 05/15/2011 Time: [id number] Time Calculation (min): 25 min  Precautions: Precautions Precautions: Fall Precaution Comments: Fall Required Braces or Orthoses:  (Left PRAFO in bed) Restrictions Weight Bearing Restrictions: No Other Position/Activity Restrictions: diplopia with patch wearing schedule, homonimous hemianopsia, left hemiplegia, apraxia, decreased sustained attention  Short Term Goals: PT Short Term Goal 1: Pt will roll left with max assist, roll right with mod assist. PT Short Term Goal 2: Pt will supine-sit with max assist. PT Short Term Goal 3: Pt will transfer with max assist. PT Short Term Goal 3 - Progress: Met PT Short Term Goal 4: W/C mobility x 50 feet mod assist in controlled environment. PT Short Term Goal 5: Tolerate standing > 3 min with mechanical lift vs. total assist +2 to initiate weight bearing and pregait activities. PT Short Term Goal 5 - Progress: Met  Skilled Therapeutic Interventions/Progress Updates:  Pt supine in bed upon entry, declined OOB with PT despite max encouragement. Pt to discharge from CIR to SNF tomorrow. Discharge re-assessment completed. Bed mobility with min assist today. More encouragement provided to participate, however pt continued to decline. Will follow-up this afternoon as able.        Pain Pain Assessment Pain Assessment: No/denies pain Pain Score: 0-No pain    Therapy/Group: Individual Therapy  Kendrick Ranch 05/15/2011, 4:25 PM

## 2011-05-15 NOTE — Progress Notes (Signed)
Speech Pathology: Dysphagia Treatment Note  Group treatment  [id number]  Patient was observed with : Dys.2 Ground and Thin liquids.  Patient was noted to have s/s of aspiration : No:    Patient required: supervision cues to consistently follow precautions/strategies of utilizing lingual sweep and minimal assist to self feed with a slow pace.  Clinical Impression: Patient progressing toward goals  Recommendations:  continue with current recommendations   Pain:   none Intervention Required:   No  Goals: progressing

## 2011-05-15 NOTE — Progress Notes (Addendum)
Patient Details  Name: Maretta Ferroni MRN: [id number] Date of Birth: 1961/04/05  Today's Date: 05/15/2011 Time: 10-10:35  Skilled Therapeutic Interventions/Progress Updates: Set pt up to brush her teeth at the sink per pt request.  Played card game seated using AE (card rack) focusing on selective attention  and expression of rules of play with mod semantic cues.  Pt required max assist hand-over-hand cues to utilize pointer finger as a strategy to assist eyes in locating a given card.   No c/o pain.     Therapy/Group: co-treat with ST  Activity Level: Simple:  Level of assist: Mod Assist  Delany Steury 05/15/2011, 12:56 PM

## 2011-05-15 NOTE — Discharge Summary (Signed)
  Discharge summary #[id number].

## 2011-05-15 NOTE — Progress Notes (Signed)
Social Work Patient ID: [name], female   DOB: 1960/07/06, 50 y.o.   MRN: [id number]   Met with pt to inform never got a hold of her sister. Her phone is not working. Pt reports she will tell her When she visits tonight. Plan to transfer to [facility] at 10:30 tom. Pt aware sister needs to take her Christmas decorations and plants.  See pt in am and gather paperwork for the transfer tomorrow.

## 2011-05-15 NOTE — Progress Notes (Signed)
Speech Language Pathology Therapy Note  & Discharge Summary  Speech Language Pathology Therapy Note Patient Details  Name: [name] MRN: [id number] Date of Birth: 1960-09-21  Today's Date: 05/15/2011 Time: 1015-1050 Time Calculation (min): 35 min  Pain Assessment Pain Assessment: No/denies pain Pain Score: 0-No pain  Precautions: Precautions Precautions: Fall Precaution Comments: Fall Required Braces or Orthoses:  (Left PRAFO in bed) Restrictions Weight Bearing Restrictions: No Other Position/Activity Restrictions: diplopia with patch wearing schedule, homonimous hemianopsia, left hemiplagia, apraxia, decreased sustained attention.  Short Term Goals: 1. Patient will demonstrate problem solving with moderate assist semantic cues with BADLs.  2. Patient will demonstrate selective attention to BADL/task for 20 minutes with supervision semantic cues.  3. Patient will request help as needed with minimal semantic cues.  4. Patient will consume Dys.2 texures without observed clinical signs of aspiration with: supervision assistance; semantic cues.  5. Patient will recall and utilize recommended strategies during swallow to increase swallowing safety with: supervision assistance; semantic cues.  Skilled Therapeutic Interventions/Progress Updates: Co-treatment session with recreation therapy.  Session focused on selective attention to a card game and SLP facilitated with moderate assist semantic cues verbally explain procedures of game and count up hand at the end.  Additionally, patient required max assist hand-over-hand cues to utilize pointer finger as a strategy to assist eyes in locating a given card.     Speech Language Pathology Discharge Summary   Long term goals set: 8  Long term goals met: 5  Comments on progress toward goals: Patient with functional gains in stay in the area of dysphagia by advancing from Dys.1 textures to Dys.2 textures and has even reduced left  pocketing with Dys. 2 textures.  Patient has also made good progress in speech intelligibility and aphasia appears to be close to baseline function (no family present to confirm).  Patient with good progress in both sustained and selective attention; however, continues to require cues to demonstrate emergent awareness and problem solving.  Patient would continue to benefit from skilled SLP services at the next level of care (SNF) to address oral dysphagia, diet advancement, requesting help as needed, problem solving, self monitoring and correcting.    Reasons goals not met: Severity of CVA: visual deficits, perceptual deficits, sensory deficits, decreased awareness, self monitoring and correcting   Equipment acquired: n/a  Reasons for discharge: discharge from hospital  Follow-up: SNF  Patient/family agrees with progress made and goals achieved: Yes  Oral/Motor: Oral Motor/Sensory Function Labial ROM: Reduced left Labial Symmetry: Within Functional Limits Labial Strength: Reduced (left) Labial Sensation: Reduced Lingual ROM: Reduced left Lingual Symmetry: Within Functional Limits Lingual Strength: Reduced Lingual Sensation: Reduced Facial ROM: Within Functional Limits Facial Symmetry: Within Functional Limits Facial Strength: Within Functional Limits Facial Sensation: Reduced (left) Velum: Within Functional Limits Mandible: Within Functional Limits Motor Speech Intelligibility: Intelligibility reduced (supervision) Comprehension: Auditory Comprehension Yes/No Questions: Impaired Basic Immediate Environment Questions: 50-74% accurate (visual impairment impact ) Commands: Impaired Two Step Basic Commands: 75-100% accurate Multistep Basic Commands: 25-49% accurate Other Conversation Comments: basic/simple conversation WFL Interfering Components: Attention;Processing speed;Other (comment) (baseline aphasia) EffectiveTechniques: Extra processing time;Pausing;Repetition Visual  Recognition/Discrimination Discrimination: Exceptions to [facility] L/R Discrimination: Unable to indentify (with field of vision) Common Objects: Unable to indentify Pictures: Unable to indentify Reading Comprehension Reading Status: Impaired Expression: Expression Primary Mode of Expression: Verbal Verbal Expression Initiation: Impaired (mild) Level of Generative/Spontaneous Verbalization: Conversation (mild aphasia, close to baseline) Repetition: No impairment Naming: Impairment Responsive: 76-100% accurate Confrontation: Within functional limits (basic items) Common  Objects: Unable to indentify Pictures: Unable to indentify Convergent: Not tested Divergent: Not tested Other Naming Comments: naming substitutions and word finding in conversation Verbal Errors: Phonemic paraphasias;Semantic paraphasias;Perseveration;Not aware of errors Pragmatics: Impairment Impairments: Eye contact;Abnormal affect;Monotone Interfering Components: Attention;Premorbid deficit Effective Techniques: Semantic cues;Sentence completion;Phonemic cues Non-Verbal Means of Communication: Not applicable Written Expression Dominant Hand: Right Written Expression: Not tested  Therapy/Group: Individual Therapy  Carmelia Roller., CCC-SLP [id number] [place] 05/15/2011 12:16 PM

## 2011-05-15 NOTE — Progress Notes (Signed)
Physical Therapy Discharge Summary  Patient Details  Name: [name] MRN: [id number] Date of Birth: 1961-06-02 Today's Date: 05/15/2011  Patient has met 4 of 8 long term goals due to improved activity tolerance, improved balance, improved postural control, increased strength, ability to compensate for deficits, functional use of  left upper extremity and left lower extremity, improved attention, improved awareness and improved coordination.  Patient to discharge at a wheelchair level [place]. Patient's care partner requires assistance to provide the necessary physical and cognitive assistance at discharge.Due to level of care required at this time, patient and family have decided to discharge to SNF to continue skilled physical therapy to further maximize functional gains prior to discharge home.  Recommendation:  Patient will benefit from ongoing skilled PT services in skilled nursing facility setting to continue to advance safe functional mobility, address ongoing impairments in balance, left stance components, activity tolerance, and strength and minimize fall risk and care giver burden.  Equipment: Equipment provided: none at this time. All equipment needs will be addressed at next venue of care.  Patient/family agrees with progress made and goals achieved: Yes  Kendrick Ranch 05/15/2011, 4:56 PM

## 2011-05-15 NOTE — Progress Notes (Signed)
Social Work Patient ID: [name], female   DOB: 10-16-1960, 50 y.o.   MRN: [id number]   Met with pt to inform bed offer from [facility]. She is agreeable to this offer and wants to go there. Will talk with her sister to inform and spoke to [facility] PA to inform of bed offer.  Team aware plan For transfer tomorrow around 10:30. Gather paperwork for discharge tom.

## 2011-05-15 NOTE — Progress Notes (Signed)
Occupational Therapy Discharge Summary  Patient Details  Name: [name] MRN: [id number] Date of Birth: September 06, 1960 Today's Date: 05/15/2011  Patient has met 4 of 10 long term goals due to improved balance, postural control and improved attention.  Patient was unable to meet 6 goals at this venue, but is adequate for transfer to next level of care (SNF).  Patient will continue to benefit from skilled OT to further address lower body bathing and dressing, transfers, and toileting hygiene.  Pt has progressed from +2 total assist with transfers and sit to stand to a consistent mod assist with second person only to steady w/c or for clothing management.  Pt has demonstrated improved dynamic sitting balance without LOB to complete LB bathing and donning of Rt sock and shoe, standing balance and weight bearing through LLE to assist second helper with hygiene or pulling up pants.  Pt requires 24/7 supervision and mod assist overall, with max assist supine to sit and with toileting hygiene.    Recommendation:  Patient will continue to received OT services in a skilled nursing facility to further address ADL/self-cares, transfers, and LUE neuro re-ed, compensation, and awareness as well we further address visual deficits.  Equipment: to be assessed at next venue of care  Patient/family agrees with progress made and goals achieved: Yes  [name] 05/15/2011, 4:26 PM

## 2011-05-15 NOTE — Progress Notes (Signed)
Occupational Therapy Session Note  Patient Details  Name: Ruey Reigel MRN: [id number] Date of Birth: 1960-07-10  Today's Date: 05/15/2011 Time: 0900-1002 Time Calculation (min): 62 min  Precautions: Precautions Precautions: Fall Precaution Comments: Fall Required Braces or Orthoses:  (Left PRAFO in bed) Restrictions Weight Bearing Restrictions: No Other Position/Activity Restrictions: diplopia with patch wearing schedule, homonimous hemianopsia, left hemiplagia, apraxia, decreased sustained attention.  Short Term Goals: OT Short Term Goal 1: Pt will complete bathing with mod assist OT Short Term Goal 1 - Progress: Met OT Short Term Goal 2: Pt will complete UB dressing with mod assist OT Short Term Goal 2 - Progress: Met OT Short Term Goal 3: Pt will complete toilet transfer with max assist using squat pivot technique OT Short Term Goal 3 - Progress: Revised (modified due to lack of progress/goal met) OT Short Term Goal 4: Pt will complete grooming in midline seated position at sink with min cues to scan to obtain items OT Short Term Goal 4 - Progress: Met  Skilled Therapeutic Interventions/Progress Updates:    Pt seen for ADL retraining with bathing periarea at bedlevel and UB and LB bathing sitting EOB.  Focus on rolling side to side with max assist, mod assist slideboard transfer with second person to steady w/c -- pt initiating more with transfer with blocking of Lt knee.  Pt reports needing to use the bathroom, slideboard transfer to/from with [facility] with +2 assist for steadying BSC.  +2 pericare to clean bottom and pull up pants with this therapist focusing on sit to stand and upright standing balance.  Pt demonstrated increased ability to maintain dynamic sitting balance with washing BLE, donning Rt sock and shoe.   Pain Pain Assessment Pain Assessment: No/denies pain Pain Score: 0-No pain ADL See FIM for further information  Therapy/Group: Individual Therapy  [name] 05/15/2011, 12:15 PM

## 2011-05-16 LAB — GLUCOSE, CAPILLARY: Glucose-Capillary: 180 mg/dL — ABNORMAL HIGH (ref 70–99)

## 2011-05-16 LAB — PROTIME-INR: Prothrombin Time: 27.5 seconds — ABNORMAL HIGH (ref 11.6–15.2)

## 2011-05-16 NOTE — Progress Notes (Signed)
Social Work Discharge Note Discharge Note  The overall goal for the admission was met for: SHORT TERM [facility]  Discharge location: Yes  Length of Stay: Yes-19 DAYS  Discharge activity level: Yes-MOD/MIN LEVEL  Home/community participation: Yes  Services provided included: MD, RD, PT, OT, SLP, RN, CM, TR, Pharmacy and SW  Financial Services: Medicaid  Follow-up services arranged: Other: SHORT TERM NHP  Comments (or additional information):  Patient/Family verbalized understanding of follow-up arrangements: Yes  Individual responsible for coordination of the follow-up plan:  SELF-ROSALIND-SISTER  Confirmed correct DME delivered: Elease Hashimoto 05/16/2011    Elease Hashimoto

## 2011-05-16 NOTE — Progress Notes (Signed)
Orders to d/c to facility today. Destination to [facility]. Pt resting comfortably. No c/o pain. Skin intact. Mod 2+ A with [name] lift. No BM today. Incontinent of urine. Wears briefs. Participating with therapy. EMS at bedside to transport.

## 2011-05-16 NOTE — Progress Notes (Signed)
ANTICOAGULATION CONSULT NOTE - Follow Up Consult  Pharmacy Consult for Coumadin Indication: CVA  Allergies  Allergen Reactions  . Ace Inhibitors Shortness Of Breath  . Vancomycin Other (See Comments)    Unknown     Vital Signs: Temp: 98.7 F (37.1 C) (12/05 0500) Temp src: Oral (12/05 0500) BP: 130/90 mmHg (12/05 0500) Pulse Rate: 78  (12/05 0500)  Labs:  Basename 05/16/11 [id number] 05/14/11 0620  HGB -- --  HCT -- --  PLT -- --  APTT -- --  LABPROT 27.5* 28.3*  INR 2.51* 2.60*  HEPARINUNFRC -- --  CREATININE -- --  CKTOTAL -- --  CKMB -- --  TROPONINI -- --   Estimated Creatinine Clearance: 82.1 ml/min (by C-G formula based on Cr of 1.1).   Medications:  Scheduled:     . amitriptyline  50 mg Oral QHS  . antiseptic oral rinse  15 mL Mouth Rinse BID  . citalopram  10 mg Oral QHS  . cycloSPORINE  1 drop Both Eyes BID  . ferrous sulfate  325 mg Oral BID  . furosemide  20 mg Oral Daily  . gabapentin  600 mg Oral QID  . glimepiride  2 mg Oral Q breakfast  . insulin aspart  0-15 Units Subcutaneous TID WC  . insulin aspart  0-5 Units Subcutaneous QHS  . losartan  100 mg Oral Daily  . methylphenidate  10 mg Oral BID WC  . metoprolol  75 mg Oral BID  . mupirocin ointment  1 application Topical BID  . simvastatin  20 mg Oral q1800  . warfarin  2.5 mg Oral Custom  . warfarin  5 mg Oral Custom   Assessment:  22 YOF with recent stroke on Coumadin.  INR therapeutic.  No bleeding noted.   Goal of Therapy:  INR 2-3   Plan:  1. Continue coumadin 2.5 daily except 5 mg TuF. 2. INR  MWF.   [name] 05/16/2011,10:05 AM

## 2011-05-16 NOTE — Progress Notes (Signed)
Patient ID: [name], female   DOB: May 23, 1961, 50 y.o.   MRN: [id number]  No new complaints, feels ok no problems in PT/OT yesterday.  Comfortable night."I walked in PT" Review of Systems  Neurological: Positive for sensory change and focal weakness.  All other systems reviewed and are negative.   Objective: Vital Signs: Blood pressure 130/90, pulse 78, temperature 98.7 F (37.1 C), temperature source Oral, resp. rate 20, height 5\' 7"  (1.702 m), weight 115.577 kg (254 lb 12.8 oz), SpO2 98.00%. No results found. No results found for this basename: WBC:2,HGB:2,HCT:2,PLT:2 in the last 72 hours CBG (last 3)   Basename 05/16/11 0702 05/15/11 2054 05/15/11 1637  GLUCAP 103* 109* 70   Results for orders placed during the hospital encounter of 04/26/11 (from the past 24 hour(s))  GLUCOSE, CAPILLARY     Status: Abnormal   Collection Time   05/15/11 11:17 AM      Component Value Range   Glucose-Capillary 142 (*) 70 - 99 (mg/dL)   Comment 1 Notify RN    GLUCOSE, CAPILLARY     Status: Normal   Collection Time   05/15/11  4:37 PM      Component Value Range   Glucose-Capillary 70  70 - 99 (mg/dL)   Comment 1 Notify RN    GLUCOSE, CAPILLARY     Status: Abnormal   Collection Time   05/15/11  8:54 PM      Component Value Range   Glucose-Capillary 109 (*) 70 - 99 (mg/dL)   Comment 1 Notify RN    GLUCOSE, CAPILLARY     Status: Abnormal   Collection Time   05/16/11  7:02 AM      Component Value Range   Glucose-Capillary 103 (*) 70 - 99 (mg/dL)   Comment 1 Notify RN      No results found for this basename: NA:2,K:2,CL:2,CO2:2,GLUCOSE:2,BUN:2,CREATININE:2,CALCIUM:2 in the last 72 hours BP Readings from Last 3 Encounters:  05/16/11 130/90  04/26/11 132/85  04/26/11 132/85   INR  Date Value Range Status  05/14/2011 2.60* 0.00-1.49 (no units) Final      Physical Exam: General appearance: morbidly obese, flat affect. Resp: clear to auscultation bilaterally Cardio: regular rate and  rhythm GI: soft, non-tender; bowel sounds normal; no masses,  no organomegaly Extremities: no edema, redness or tenderness in the calves or thighs Pulses: 2+ and symmetric Skin: Skin color, texture, turgor normal. No rashes or lesions Neurologic: Gait: working on pregait activities. Alert and oriented X 3, flat affect, left inattention, left hemiparesis UE(0/5)>LE except 2- L triceps.  Left hemisensory deficits plus insensate feet bilateral .  Poor awareness.  LLE has 2-/5 knee/hip extensor synergy . Assessment/Plan: 1. Functional deficits secondary to  Right Cardioembolic CVA  which require 3+ hours per day of interdisciplinary therapy in a comprehensive inpatient rehab setting. Physiatrist is providing close team supervision and 24 hour management of active medical problems listed below. Physiatrist and rehab team continue to assess barriers to discharge/monitor patient progress toward functional and medical goals. 2.  Diabetes- stable 3.  Anemia- unchanged 4.  HTN- controlled Mobility: Bed Mobility Bed Mobility: No Rolling Right: 4: Min assist Rolling Right Details (indicate cue type and reason): assist to flex left LE and bring UE across chest.  max cues for sequencing and max facilitation to rotate trunk Rolling Left: 4: Min assist Rolling Left Details (indicate cue type and reason): assist to flex left leg, cues to maintain right leg flexion.  Utilized bed rails with verbal and  tactile cues Supine to Sit: 2: Max assist Supine to Sit Details (indicate cue type and reason): pt declined during last PT session. Sit to Supine - Left: 2: Max assist Sit to Supine - Left Details (indicate cue type and reason): Pt declined during last PT session. Transfers Transfers: Yes Sit to Stand: 3: Mod assist Sit to Stand Details (indicate cue type and reason): Pt declined during last PT session. Stand to Sit: 3: Mod assist Stand to Sit Details: Pt declined during last PT session. Lateral/Scoot  Transfers: 1: +2 Total assist;With slide board Lateral/Scoot Transfer Details (indicate cue type and reason): Pt declined during last PT session. Ambulation/Gait Ambulation/Gait Assistance: 1: +2 Total assist Stairs: No (Pt declined during last PT session.) Wheelchair Mobility Wheelchair Mobility: No (Pt declined during last PT session.) Distance: 150 ADL:   Cognition: Cognition Overall Cognitive Status: Impaired Arousal/Alertness: Awake/alert Orientation Level: Oriented to person;Oriented to place;Oriented to situation (flat affect/poor initiation,folows simlpe commands) Attention: Alternating Sustained Attention: Appears intact Sustained Attention Impairment: Functional basic Selective Attention: Appears intact Alternating Attention: Impaired Alternating Attention Impairment: Verbal basic;Functional basic Memory: Impaired Memory Impairment: Decreased recall of new information Awareness: Impaired Awareness Impairment: Emergent impairment (supervision-minimal assist) Problem Solving: Impaired Problem Solving Impairment: Functional basic Executive Function: Reasoning;Self Monitoring;Self Correcting Reasoning: Impaired Reasoning Impairment: Verbal basic Self Monitoring: Impaired Self Monitoring Impairment: Functional basic;Verbal basic Self Correcting: Impaired Self Correcting Impairment: Verbal basic;Functional basic Behaviors: Perseveration;Impulsive Safety/Judgment: Impaired Comments: Moves very quickly at times, impulsive Cognition Arousal/Alertness: Awake/alert Orientation Level: Oriented to person;Oriented to place;Oriented to situation (flat affect/poor initiation,folows simlpe commands)  2. Anticoagulation/DVT prophylaxis with Pharmaceutical: Coumdin .  Continue aspirin and Lovenox  till INR therapeutic.  3. Pain Management: Monitor for now for OA symptoms.  Patient Active Hospital Problem List: DIABETES MELLITUS, TYPE II (01/03/2007)   Assessment: blood sugars  120-150 range generally   Plan:Cont.  Amaryl 2mg l. Monitor over next few days. HYPERLIPIDEMIA (01/03/2007)   Assessment:    Plan: continue zocor DEPRESSION (01/03/2007)   Assessment: Aprosodia.Post CVA depression ritalin trial f/u with me as outpt   Plan: Started on celexa.  Ego support by team.   NEUROPATHY, IDIOPATHIC PERIPHERAL NOS (01/07/2007)   Assessment: Pain BLE especially with increased activity per patient   Plan: Continue neurontin but increase to QID ANEMIA (07/07/2010)   Assessment: ?etiology.    Plan: Anemia panel with low iron stores.  Continue iron supplement.  Will monitor H and H for now.  Check stool guaiacs.   HYPERTENSION (07/07/2010)   Assessment:  systolic's resonalble.  Monitor for trends.   Plan: Continue losartan, metoprolol, furosemide Cardiomyopathy (04/25/2011)      Assessment: EF @ 20-25%.  Likely contributing to SOB with activity.   Plan: Monitor for symptoms.  Monitor for signs of overload.  Will add low salt and CM restrictions.  FACE 2 FACE today [place] E 05/16/2011, 7:24 AM

## 2011-05-16 NOTE — Discharge Summary (Signed)
NAMEGENEL, KUCERA NO.:  [id number]  MEDICAL RECORD NO.:  [id number]  LOCATION:  4001                         FACILITY:  [facility]  PHYSICIAN:  Charlett Blake, M.D.DATE OF BIRTH:  [birth year]  DATE OF ADMISSION:  04/26/2011 DATE OF DISCHARGE:                              DISCHARGE SUMMARY   HISTORY OF PRESENT ILLNESS:  [name] is a 50 year old female with a history of hypertension, diabetes mellitus, CVA in November 2011, with left-sided weakness and mild aphasia residually, admitted in November 2011, with slurred speech and a slight right facial droop.  The patient with worsening of left-sided weakness past admission.  CT of head done showed no acute changes.  MRI of brain done showed acute large right MCA infarct affecting insular cortex, posterior frontal cortex, superior temporal cortex, and underlying white matter.  MRA without stenosis.  A 2D echo done showed EF of 30% to 35%, with moderate-to- severe hypokinesis.  Carotid Dopplers done showed no ICA stenosis.  The patient was evaluated by Neurology, recommended TEE as the patient with significant decrease in EF.  Hypercoagulopathy and vasculitis panel were ordered.  The patient has had outpatient cardiac telemetry which was negative for PAF.  TEE done revealed moderate LVH with severe global hypokinesis with EF of 20% to 25%, biatrial enlargement with severe stasis, moderate-to-severe MR, no PFO and severe atherosclerosis of the aortic arch.  Cardiology recommends low-dose diuretic due to decreased EF and no Ace due to history of shortness of breath with lisinopril. Neurology recommends Coumadin given TEE findings, and as the patient with 2 embolic strokes within a year.  Currently, the patient continues with dense left upper extremity plegia with hemisensory deficits.  She is noted to have dense left homonymous hemianopsia with mild left facial weakness and dysarthria with a right preference.   She is on a puree diet with thin liquids due to severe pocketing on the left and impulsivity at meals.  The family reported bloody purulent drainage from the left great toe, and recommended Podiatry consult for nail debridement in the future as well as local care with Betadine swabs.  Podiatry was consulted today.  Therapies were initiated and are currently working on pre-gait activity.  Currently, the patient requires max cues for sequencing with decreased awareness of deficits.  The patient was evaluated by rehab and we felt that she would benefit from a CIR program.  REVIEW OF SYSTEMS:  Positive for ear pain, blurred vision, double vision, frequency, joint pains, as well as focal weakness with headaches.  Also noted to have depressed mood.  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  PAST SURGICAL HISTORY:  Positive for tympanoplasty, left second toe amputation, D and C, and laser surgery for retinopathy.  ALLERGIES:  VANCOMYCIN AND LISINOPRIL.  FAMILY HISTORY:  Positive for coronary artery disease and diabetes.  SOCIAL  HISTORY:  The patient lives with a sister and was independent for ADLs and mobility.  She reports she has never smoked, does not use smokeless tobacco.  Drinks alcohol occasionally.  Does not use any illicit drugs.  FUNCTIONAL HISTORY:  The patient required assisted device prior to admission.  Needed assist for transfers from low surfaces.  Required minimal assistance for bathing.  Functional status, the patient is total assist for feeding.  She is +1,  20% to 35% for bed mobility.  She is +2 total assist, 35% to sit at the edge of bed with head of bed raised to 45 degrees.  She is +2 total assist 20% for sit to supine.  PHYSICAL EXAMINATION:  GENERAL:  The patient is an obese female with a left facial droop, flat affect, and oriented x3. HEENT:  Oral mucosa with white coating and food residue on tongue.  Poor dentition noted.  Eyes, unable to cross over to left field. NECK:  Supple with normal range of motion. HEART:  Regular rate rhythm. LUNGS:  Normal effort and lungs sounds are clear. ABDOMEN:  Soft and nontender.  Positive bowel sounds. NEUROLOGIC:  The patient is alert and oriented x3.  She is noted to have left facial weakness with dysarthric speech with increased rate.  Left homonymous hemianopsia, blurred double vision reported.  Decreased sensation left greater than right.  Dense left upper extremity paresis, right gaze preference, decreased awareness.  Able to correct state and month with minimal cues.  Unable to state year with max cues. SKIN:  Warm and dry. EXTREMITIES:  Left great toe with purulent drainage when depressed. PSYCHIATRIC:  Apathy noted.  HOSPITAL COURSE:  [name] was admitted to rehab on April 26, 2011, for inpatient therapies to consist of PT/OT and speech therapy at least 3 hours for 5 days a week.  Past admission, physiatrist, rehab, RN, and therapy team have worked together to provide customized collaborative interdisciplinary care.  Rehab RN has worked with the patient on bowel and bladder program, management as well as safety wound care and pain management issues to help integrate therapy concepts techniques as well as education.  The patient was monitored with b.i.d. blood pressure checks.  These have been reasonable ranging from 0000000 systolics, [id number] diastolic.  The patient's diabetes was monitored with a.c. and bedtime CBG checks, and these have ranged from 60s-140s range. Tight  control was not attempted to prevent hypoglycemic episodes.  The patient was maintained on Coumadin for CVA prophylaxis throughout her stay.  Labs done on the past admission revealed the patient with renal insufficiency with BUN at 34, creatinine at 1.44.  She was also noted to be anemic with H and H at 8.8 and 28.6.  She was noted to have low iron stores, therefore iron supplements was added.  Recheck CBC of May 08, 2011, revealed improvement with hemoglobin at 10.3, hematocrit 33.3, white count 4.3, and platelets 436.  Recheck of renal status done on May 11, 2011, shows resolution of renal insufficiency with sodium 141, potassium 4.8, chloride 107, CO2 26, BUN 21, creatinine 1.1, and glucose 76.  Podiatry followed up on the patient for treatment of her infected left great toe.  This was incised and drained.  X-rays of the left toe done showed no evidence of osteomyelitis.  They also ordered ABIs that were performed on May 01, 2011, showing noncompressible vessels bilaterally and poor circulation, left great toe.  A question of angiogram was brought up.  He felt  that as the patient is not a bypass candidate, angiogram would be more useful as a preoperative planning tool.  DVI is greater than 0.6 predicting success after toe amputation. As surgery is not indicated currently, this was not done.  The patient did report depressed mood at the time of admission, and she was started on Celexa for mood stabilization.  She reported bilateral lower extremity pain, especially with activity and Neurontin was increased to a q.i.d. basis.  During the patient's stay in the rehab, weekly team conferences were held to monitor the patient's progress, set goals, as well as discuss barriers to discharge.  At the time of admission, the patient was limited by her dense left hemiplegia with diplopia, homonomous hemianopsia, and motor apraxia.  She was also noted to have decreased sustained  left-sided activation with absent balance reaction, poor graded weight shifting in all planes, decreased postural control frequently collapsing to the left with decreased bilateral hip and left ankle range of motion.  The patient was total assist for mobility due to all of these deficits.  A left upright AFO was ordered to use in bed to help prevent left ankle contracture.  PT has been ongoing and currently the patient is able to perform schooling lateral transfers to wheelchair using sliding board with mod assist to right, mod to max assist to left, with setup and second person assist for safety.  She is able to roll on mat with right hip placed in hooking position with min assist.  Able to roll to left in hooking with max cues and assist of min to mod assist with assist of left upper extremity.  She is able to ambulate 15 feet x2 using Maxi Sky and Ethelene Hal with +2 assist to advance left lower extremity and assistive device.  She intermittently attempting to advance left lower extremity with Ace wrap on ankle, but continued to lean to the left with flexed knee in stance.  She was able to stand from mat to back of wheelchair with mod assist with improved truncal extension control in stance with blocking of left knee for approximately 2-3 minutes.  OT has worked with the patient on self-care tasks.  They have focused on utilization of left upper extremity for grooming as well as ADL tasks.  They have focused on personal care at bed level and upper and lower body bathing while sitting at the edge of the bed.  Currently, the patient is +2 assist for hygiene, +2 assist for transfers to bedside commode, +2 assist to pull up pants with focus on sit to stand and upright balance.  She is demonstrating increased ability to maintain dynamic sitting balance for washing bilateral lower extremity as well as donning her right shoe and sock.  Speech therapy has been ongoing with focus on improving  sustained attention as well as improving problem solving and dysphagia treatment.  Currently, the patient is on D2 diet without any signs or symptoms of infection.  She requires min verbal cues to clear left anterior spillage and residual in the left cheek. She requires hand over hand assist to use finger as an assisted device for visual scanning to the left.  She is max assist to ID shapes on cards and to determine if these were identical.  She requires mod assist with semantic cues, explained procedures in a card game, and count hand at the end.  Currently, the patient continues to require significant assist and family has elected on progressive therapies at [facility]  level. Bed is available at [facility] for May 16, 2011, and the patient is to be discharged to this facility with progressive PT/OT, speech therapy to continue past discharge.  DISCHARGE MEDICATIONS:  Elavil 50 mg p.o. at bedtime, biotin rinse 15 mL b.i.d. swish and spit, Celexa 10 mg p.o. at bedtime, Restasis 0.05% 1 GT OU b.i.d., ferrous sulfate 325 mg b.i.d., Lasix 20 mg p.o. per day, Neurontin 600 mg p.o. q.i.d., Amaryl 2 mg p.o. per day, Cozaar 100 mg p.o. per day, Ritalin 10 mg p.o. at 07:00 a.m. and noon daily, metoprolol 75 mg p.o. b.i.d., Zocor 20 mg p.o. at bedtime, Coumadin 2.5 mg on Monday, Wednesday, Thursday, Friday, and Sunday, and 5 mg on Tuesdays and Saturdays, Tylenol 650 mg p.o. q.4 h. p.r.n. pain, Robaxin 500 mg p.o. q.6 h. p.r.n. spasms, and Maalox Plus 30 mL p.o. q.i.d. p.r.n. indigestion.  DIET INSTRUCTIONS:  Carb modified medium, D2 diet thin liquids with supervision at meals.  SPECIAL INSTRUCTIONS:  Routine pressure relief measures.  Continue to monitor the left toe for any recurrent drainage.  CBG checks a.c. at bedtime with sliding scale insulin per protocol.  Toilet patient cue 3-4 hours while awake.  Progressive PT/OT, speech therapy to continue past discharge.  FOLLOWUP:  The patient is to  follow up with Dr. Alysia Penna in 4 weeks.  Follow up with Dr. Haroldine Laws in the next 2 weeks.  Follow up with Dr. Leonie Man in 4-6 weeks.  Follow up with Dr. Adele Barthel for routine check and input on left great toe.     Reesa Chew, P.A.   ______________________________ Charlett Blake, M.D.    PL/MEDQ  D:  05/15/2011  T:  05/15/2011  Job:  [id number]  cc:   Pramod P. Leonie Man, MD Shaune Pascal. Bensimhon, MD Ishmael Holter. Sarajane Jews, MD Conrad Bayside, MD

## 2011-05-16 NOTE — Progress Notes (Signed)
Therapeutic Recreation Discharge Summary Patient Details  Name: Kyelle Brushaber MRN: [id number] Date of Birth: 22-Oct-1960  Long term goals set: 1  Long term goals met: 0  Comments on progress toward goals: Pt is progressing toward LTG, but is currently Mod assist due to decreased cognition and poor visual scanning to the left.  Pt d/c'd to SNF today for continued therapy and 24 hour care.  Reasons goals not met: see above  Equipment acquired: none  Reasons for discharge: discharge from hospital  Follow-up: encourage participation in Recreation therapy and/or activities program at [facility]  Patient/family agrees with progress made and goals achieved: Yes  Jnai Snellgrove 05/16/2011, 2:17 PM

## 2011-05-16 NOTE — Progress Notes (Signed)
Social Work Patient ID: [name], female   DOB: 09-Mar-1961, 50 y.o.   MRN: [id number]  Met with sister early am who reports would like [facility] pursued since pt's daughter in-law works there as a Therapist, sports. Have contacted them and sent paperwork they are trying to work out an admission, but are not confirming bed offer. [facility] still has a bed and can take pt.  Awaiting return call from [place] sister and pt aware. Also aware being discharged Today to either facility.

## 2011-05-24 ENCOUNTER — Other Ambulatory Visit: Payer: Self-pay | Admitting: Podiatrist

## 2011-05-25 ENCOUNTER — Encounter: Payer: Self-pay | Admitting: Vascular Surgery

## 2011-05-30 ENCOUNTER — Ambulatory Visit (INDEPENDENT_AMBULATORY_CARE_PROVIDER_SITE_OTHER): Payer: Medicaid Other | Admitting: Physician Assistant

## 2011-05-30 ENCOUNTER — Encounter: Payer: Self-pay | Admitting: Physician Assistant

## 2011-05-30 DIAGNOSIS — I10 Essential (primary) hypertension: Secondary | ICD-10-CM

## 2011-05-30 DIAGNOSIS — E785 Hyperlipidemia, unspecified: Secondary | ICD-10-CM

## 2011-05-30 DIAGNOSIS — I428 Other cardiomyopathies: Secondary | ICD-10-CM

## 2011-05-30 DIAGNOSIS — N19 Unspecified kidney failure: Secondary | ICD-10-CM

## 2011-05-30 MED ORDER — METOPROLOL TARTRATE 100 MG PO TABS: 100.0000 mg | ORAL_TABLET | Freq: Two times a day (BID) | ORAL | Status: DC

## 2011-05-30 NOTE — Progress Notes (Signed)
[place], [place]  [address] Phone: [phone] Fax:  [phone]  Date:  05/30/2011   Name:  [name]       DOB:  01-30-1961 MRN:  [id number]  PCP:  Dr. Sarajane Jews Primary Cardiologist:  Dr. Jenkins Rouge  Primary Electrophysiologist:  None    History of Present Illness: [name] is a 50 y.o. female who presents for post hospital follow up.  She has a history of hypertension, hyperlipidemia, diabetes, prior stroke.  She was admitted 11/11-11/15 with right MCA distribution stroke.  Echocardiogram demonstrated depressed LV function, which was new.  Echocardiogram 04/23/11: Mild LVH, EF 30-35%, mild MR, mild LAE, mild-moderate RVE.  TEE 04/25/11: Moderate LVH, severe biventricular dysfunction, EF 20-25%, moderate to severe MR, moderate TR, biatrial enlargement, no PFO, negative bubble study.  Carotid Dopplers were negative.  ABIs did demonstrate posterior tibial disease rated as severe.  She has a wound on her left foot that is being followed by vascular surgery.  She had heavy pooling of blood on TEE and Coumadin therapy was initiated.  She was discharged to inpatient rehabilitation and discharged 05/16/11.    She is currently living at [facility].  She is here with her sister today.  Her left side is still weak.  She denies significant shortness of breath.  She denies chest pain.  She denies orthopnea, PND or edema.  She denies syncope.  She seems to be doing well with physical therapy.  Past Medical History  Diagnosis Date  . HTN (hypertension)   . HLD (hyperlipidemia)   . History of methicillin resistant staphylococcus aureus (MRSA)   . Sleep apnea   . CVA (cerebral infarction)   . Anemia   . Tachycardia   . Obesity   . Renal failure     due to vanc toxicity  . Osteomyelitis   . Lower back pain     Chronic  . Idiopathic peripheral neuropathy     NOS  . DJD (degenerative joint disease)   . Headache   . GERD (gastroesophageal reflux  disease)   . DM type 2 (diabetes mellitus, type 2)   . Depression   . Anxiety   . Hypokalemia     resolved  . Cerebral infarct     left parietal and bilateral  . Esophagitis     distal  . Fluid overload     compensated  . Urinary retention   . H/O: pneumonia   . History of bacteremia   . Surgery, other elective     of the ear  . Stroke     Hx of left frontoparietal stroke in 12/11. History of right brain stroke.  . Cardiomyopathy     Echocardiogram 04/23/11: Mild LVH, EF 30-35%, mild MR, mild LAE, mild-moderate RVE.  TEE 04/25/11: Moderate LVH, severe biventricular dysfunction, EF 20-25%, moderate to severe MR, moderate TR, biatrial enlargement, no PFO, negative bubble study    Current Outpatient Prescriptions  Medication Sig Dispense Refill  . Albuterol Sulfate (PROAIR HFA IN) Inhale into the lungs as needed.        Marland Kitchen amitriptyline (ELAVIL) 50 MG tablet Take 50 mg by mouth at bedtime. For nerves/rest       . aspirin 325 MG tablet Take 325 mg by mouth daily.        . cycloSPORINE (RESTASIS) 0.05 % ophthalmic emulsion Place 1 drop into both eyes 2 (two) times daily.        Marland Kitchen  Diphenhydramine-APAP, sleep, (EXCEDRIN PM PO) Take 500 mg by mouth daily. Take two at bedtime       . ferrous sulfate 325 (65 FE) MG tablet Take 325 mg by mouth 2 (two) times daily.        Marland Kitchen gabapentin (NEURONTIN) 600 MG tablet Take 600 mg by mouth. 4 times per day       . hydrochlorothiazide (MICROZIDE) 12.5 MG capsule Take 12.5 mg by mouth daily.        Marland Kitchen lisinopril (PRINIVIL,ZESTRIL) 20 MG tablet Take 20 mg by mouth daily.        . metFORMIN (GLUCOPHAGE) 1000 MG tablet Take 1,000 mg by mouth 2 (two) times daily.       . metoprolol (LOPRESSOR) 50 MG tablet Take 50 mg by mouth 2 (two) times daily.       . Multiple Vitamin (MULTIVITAMIN) capsule Take 1 capsule by mouth daily.        Marland Kitchen omeprazole (PRILOSEC) 20 MG capsule Take 20 mg by mouth daily.        . potassium chloride SA (K-DUR,KLOR-CON) 20 MEQ tablet  Take 20 mEq by mouth daily.        . pravastatin (PRAVACHOL) 40 MG tablet Take 40 mg by mouth at bedtime.          Allergies: Allergies  Allergen Reactions  . Ace Inhibitors Shortness Of Breath  . Vancomycin Other (See Comments)    Unknown     History  Substance Use Topics  . Smoking status: Never Smoker   . Smokeless tobacco: Never Used  . Alcohol Use: Yes     rare on holidays     PHYSICAL EXAM: VS:  BP 118/76  Pulse 98  Ht 5\' 7"  (1.702 m) Well nourished, well developed, in no acute distress HEENT: normal Neck: no JVD At 90 degrees Cardiac:  normal S1, S2; RRR; no murmur Lungs:  clear to auscultation bilaterally, no wheezing, rhonchi or rales Abd: soft, nontender Ext: no edema Skin: warm and dry Neuro:  CNs 2-12 intact, no focal abnormalities noted  EKG:   Sinus rhythm, heart rate 98, left axis deviation, poor R-wave progression, No significant change  ASSESSMENT AND PLAN:

## 2011-05-30 NOTE — Patient Instructions (Addendum)
Your physician recommends that you schedule a follow-up appointment in: 6 weeks with Dr Johnsie Cancel Your physician recommends that you return for lab work in: today for a BMP  Your physician has recommended you make the following change in your medication: INCREASE Metoprolol to 100 mg twice daily

## 2011-05-30 NOTE — Assessment & Plan Note (Signed)
Continue simvastatin. 

## 2011-05-30 NOTE — Assessment & Plan Note (Signed)
No signs or symptoms of congestive heart failure.  Check a basic metabolic panel today.  Continue Cozaar.  Advance metoprolol to 100 mg twice a day for better heart rate control.  Continue furosemide.  Followup with Dr. Johnsie Cancel in 6 weeks.  Suspect she will need followup echocardiogram at some point in near future to reassess her LV function.  Consider further cardiovascular testing after further recovery from her stroke.

## 2011-05-30 NOTE — Assessment & Plan Note (Signed)
Controlled.  

## 2011-05-31 ENCOUNTER — Encounter: Payer: Self-pay | Admitting: Vascular Surgery

## 2011-05-31 LAB — BASIC METABOLIC PANEL
BUN: 36 mg/dL — ABNORMAL HIGH (ref 6–23)
Calcium: 8.8 mg/dL (ref 8.4–10.5)
Chloride: 103 mEq/L (ref 96–112)
Creatinine, Ser: 1.8 mg/dL — ABNORMAL HIGH (ref 0.4–1.2)
GFR: 38.51 mL/min — ABNORMAL LOW (ref >60.00)

## 2011-06-01 ENCOUNTER — Encounter: Payer: Self-pay | Admitting: Vascular Surgery

## 2011-06-01 ENCOUNTER — Ambulatory Visit (INDEPENDENT_AMBULATORY_CARE_PROVIDER_SITE_OTHER): Payer: Medicaid Other | Admitting: Vascular Surgery

## 2011-06-01 VITALS — BP 131/85 | HR 102 | Resp 16 | Ht 67.0 in | Wt 232.0 lb

## 2011-06-01 DIAGNOSIS — L98499 Non-pressure chronic ulcer of skin of other sites with unspecified severity: Secondary | ICD-10-CM

## 2011-06-01 DIAGNOSIS — I6529 Occlusion and stenosis of unspecified carotid artery: Secondary | ICD-10-CM

## 2011-06-01 DIAGNOSIS — Z8673 Personal history of transient ischemic attack (TIA), and cerebral infarction without residual deficits: Secondary | ICD-10-CM

## 2011-06-01 DIAGNOSIS — I739 Peripheral vascular disease, unspecified: Secondary | ICD-10-CM | POA: Insufficient documentation

## 2011-06-01 DIAGNOSIS — I7025 Atherosclerosis of native arteries of other extremities with ulceration: Secondary | ICD-10-CM | POA: Insufficient documentation

## 2011-06-01 NOTE — Progress Notes (Signed)
[facility] [place]  Established Intermittent Claudication  History of Present Illness  [name] is a 50 y.o. female who presents with chief complaint: follow-up from hospital.  The patient is in a nursing home.  She currently has very limited ambulation, only in context of her s/p CVA PT.  She continues to have signficiant L sided neuro deficits.  She has no intermittent claudication currently or rest pain.  The patient's treatment regimen currently included: maximal medical management.  Past Medical History, Past Surgical History, Social History, Family History, Medications, Allergies, and Review of Systems are unchanged from previous evaluation on 04/26/11.  Physical Examination  Filed Vitals:   06/01/11 0934  BP: 131/85  Pulse: 102  Resp: 16  Height: 5\' 7"  (1.702 m)  Weight: 232 lb (105.235 kg)  SpO2: 93%    General: A&O x 3, WDWN, obese  Pulmonary: Sym exp, good air movt, CTAB, no rales, rhonchi, & wheezing  Cardiac: RRR, Nl S1, S2, no Murmurs, rubs or gallops  Vascular: no palpable L foot pulses  Musculoskeletal: Unable to lift left foot, Left great has extensive amount of callous at toe tip, which I don't think is dry gangrene, the toe has a blackened hue but some this is just hyperpigmentation  Neurologic: Pain and light touch intact in extremities , Motor exam as listed above  Medical Decision Making  Jashia Gallardo is a 50 y.o. female who presents with: BLE PAD without intermittent claudication or critical limb ischemia currently.  Based on the patient's vascular studies and examination, I have offered the patient: continue q6 month surveillance with ABI.  I discussed in depth with the patient the nature of atherosclerosis, and emphasized the importance of maximal medical management including strict control of blood pressure, blood glucose, and lipid levels, obtaining regular exercise, and cessation of smoking.  The patient is aware that  without maximal medical management the underlying atherosclerotic disease process will progress, limiting the benefit of any interventions.  I have also referred her to Podiatrist to further evaluate the left great toe.  Without some debridement of the callous, there is no way to determine if any the tissue is devitalized.  Regardless, her toe pressures on the L suggest she has adequate flow to heal any interventions on that toe.  Thank you for allowing [place] to participate in this patient's care.  Sherry Barthel, MD [facility] Office: [phone] Pager: [phone]

## 2011-06-15 ENCOUNTER — Other Ambulatory Visit (INDEPENDENT_AMBULATORY_CARE_PROVIDER_SITE_OTHER): Payer: Medicaid Other | Admitting: *Deleted

## 2011-06-15 DIAGNOSIS — L97509 Non-pressure chronic ulcer of other part of unspecified foot with unspecified severity: Secondary | ICD-10-CM

## 2011-06-19 ENCOUNTER — Encounter: Payer: Medicaid Other | Attending: Physical Medicine & Rehabilitation

## 2011-06-19 ENCOUNTER — Inpatient Hospital Stay (HOSPITAL_BASED_OUTPATIENT_CLINIC_OR_DEPARTMENT_OTHER): Payer: Medicaid Other | Admitting: Physical Medicine & Rehabilitation

## 2011-06-19 ENCOUNTER — Encounter: Payer: Self-pay | Admitting: Podiatry

## 2011-06-19 DIAGNOSIS — G8110 Spastic hemiplegia affecting unspecified side: Secondary | ICD-10-CM

## 2011-06-19 DIAGNOSIS — E119 Type 2 diabetes mellitus without complications: Secondary | ICD-10-CM | POA: Insufficient documentation

## 2011-06-19 DIAGNOSIS — H5340 Unspecified visual field defects: Secondary | ICD-10-CM | POA: Insufficient documentation

## 2011-06-19 DIAGNOSIS — H539 Unspecified visual disturbance: Secondary | ICD-10-CM | POA: Insufficient documentation

## 2011-06-19 DIAGNOSIS — I10 Essential (primary) hypertension: Secondary | ICD-10-CM | POA: Insufficient documentation

## 2011-06-19 DIAGNOSIS — I69959 Hemiplegia and hemiparesis following unspecified cerebrovascular disease affecting unspecified side: Secondary | ICD-10-CM | POA: Insufficient documentation

## 2011-06-19 DIAGNOSIS — I69998 Other sequelae following unspecified cerebrovascular disease: Secondary | ICD-10-CM | POA: Insufficient documentation

## 2011-06-19 NOTE — Assessment & Plan Note (Signed)
[name] is a 51 year old female with history hypertension, diabetes, and a prior CVA in November 2011 with left-sided weakness residual admitted in November 2012 with slurred speech and worsening of left-sided weakness. A CT of head showed no acute changes.  MRI showed an acute right MCA infarct.  She had additional workup.  Moderate-to-severe hypokinesis but no clots noted on 2-D echo.  Carotid Dopplers showed no ICA stenosis. Negative hypercoagulable state.  Negative vasculitis panel.  She went through inpatient rehab from November 15 to May 16, 2011.  She was discharged, mod to max assist left-sided sliding board transfers, moderate assist light right side, rolling with max cues, minutes to mod assist, able walker +2 to advanced left lower extremity, needing +2 assist for transfers to the toilet.  I spoke to the patient and her sister.  Currently, she is requiring +2 assist with toilet transfers, ambulating with the therapist but once again, +2 assist.  She states she had a fall onto her buttock, was evaluated at the skilled nursing facility where she resides, and has been treated with Tylenol. She can walk very short distances.  As noted above, she needs help with dressing, bathing, and toileting.  She has bladder control problems, bowel control problems, weakness, trouble walking, dizziness, confusion, depression, anxiety, poor appetite, limb swelling.  PAST MEDICAL HISTORY:  Diabetes and hypertension.  FAMILY HISTORY:  Heart disease, diabetes, and hypertension.  OBJECTIVE:  VITAL SIGNS:  Blood pressure 149/81, pulse 92, respiratory rate 16, and O2 saturation 98.5. GENERAL:  No acute distress.  Mood and affect are appropriate. MUSCULOSKELETAL:  I tried to stand her, but she was not able to help much with the right lower extremity. She does not have any other assistive device with her. NEUROLOGIC:  Her motor strength is 2- in the left biceps, otherwise 0/5 in left upper  extremity.  In the left lower extremity, she has 2- knee extensors, 0/5 at the ankle, 0 at the hip flexor.  Sensation reduced on the left side.  She has decreased visual field with left homonymous hemianopsia as well as left neglect.  IMPRESSION:  Right middle cerebellar artery infarct with left hemiplegia, left neglect, and left homonymous hemianopsia.  PLAN:  I would recommend continued skilled nursing facility level therapy given that she still requiring +2 assistance for many of her activities of daily living and too heavy for her family to care for her at the current time.  I would like to see her back in 1-2 months.  Re- evaluate at that time.  She really has no spasticity to work on at this point.  She will follow up at the end of the month with Dr. Johnsie Cancel.  She has recently seen Dr. Bridgett Larsson.     Charlett Blake, M.D. Electronically Signed    AEK/MedQ D:  06/19/2011 17:06:16  T:  06/19/2011 23:13:53  Job #:  [id number]  cc:   Wallis Bamberg. Johnsie Cancel, [place], [place] [address]  [address] [place] [address]  Dr. Bridgett Larsson

## 2011-06-22 ENCOUNTER — Ambulatory Visit: Payer: Medicaid Other | Admitting: Vascular Surgery

## 2011-07-09 ENCOUNTER — Other Ambulatory Visit: Payer: Self-pay | Admitting: Vascular Surgery

## 2011-07-10 ENCOUNTER — Encounter: Payer: Self-pay | Admitting: Thoracic Diseases

## 2011-07-11 ENCOUNTER — Other Ambulatory Visit: Payer: Medicaid Other

## 2011-07-11 ENCOUNTER — Encounter: Payer: Self-pay | Admitting: Thoracic Diseases

## 2011-07-11 ENCOUNTER — Ambulatory Visit (INDEPENDENT_AMBULATORY_CARE_PROVIDER_SITE_OTHER): Payer: Medicaid Other | Admitting: Thoracic Diseases

## 2011-07-11 VITALS — BP 101/70 | HR 77 | Resp 18 | Ht 67.0 in | Wt 242.0 lb

## 2011-07-11 DIAGNOSIS — I739 Peripheral vascular disease, unspecified: Secondary | ICD-10-CM | POA: Insufficient documentation

## 2011-07-11 NOTE — Progress Notes (Signed)
[facility]  -PAD [id number] left leg Referring Physician:Dr. Dellia Nims  History of Present Illness  [name] is a 51 y.o. female patient who presents with chief complaint of left lower extremity PAD. Pt. Has had a stroke and has min movement of left LE.  Pt. denies rest pain; reports non healing ulcers on right lower extremity at 5th metatarsal head and over lateral malleolus which are improving with local care. She was sent for further evaluation. Because the pt cannot move left leg well sec. To CVA the leg is often externally rotated and she wears a heel suspension boot as well  Pt has not had previous vascular intervention    Past Medical History  Diagnosis Date  . HTN (hypertension)   . HLD (hyperlipidemia)   . History of methicillin resistant staphylococcus aureus (MRSA)   . Sleep apnea   . CVA (cerebral infarction)   . Anemia   . Tachycardia   . Obesity   . Renal failure     due to vanc toxicity  . Osteomyelitis   . Lower back pain     Chronic  . Idiopathic peripheral neuropathy     NOS  . DJD (degenerative joint disease)   . Headache   . GERD (gastroesophageal reflux disease)   . DM type 2 (diabetes mellitus, type 2)   . Depression   . Anxiety   . Hypokalemia     resolved  . Cerebral infarct     left parietal and bilateral  . Esophagitis     distal  . Fluid overload     compensated  . Urinary retention   . H/O: pneumonia   . History of bacteremia   . Surgery, other elective     of the ear  . Stroke     Hx of left frontoparietal stroke in 12/11. History of right brain stroke.  . Cardiomyopathy     Echocardiogram 04/23/11: Mild LVH, EF 30-35%, mild MR, mild LAE, mild-moderate RVE.  TEE 04/25/11: Moderate LVH, severe biventricular dysfunction, EF 20-25%, moderate to severe MR, moderate TR, biatrial enlargement, no PFO, negative bubble study    Social History History  Substance Use Topics  . Smoking status: Never Smoker   .  Smokeless tobacco: Never Used  . Alcohol Use: Yes     rare on holidays    Family History Family History  Problem Relation Age of Onset  . Coronary artery disease    . Diabetes    . Cancer Mother   . Diabetes Mother   . Heart disease Father   . Hyperlipidemia Father   . Hypertension Father   . Stroke Father   . Heart disease Brother    Physical Examination  Filed Vitals:   07/11/11 1314  BP: 101/70  Pulse: 77  Resp: 18    Body mass index is 37.90 kg/(m^2).  General: A&O x 3, WDWN,  SENSATION ;normal; MOTOR FUNCTION: decreased motion on left secondary to stroke Skin of left lower extremity is very dry with ulcers of lateral malleolus and 5th metatarsal head with dry eschar                  Left Great toe unchanged Pulses: Left - Palp femoral,  monophasic doppler flow in DP/PT/Peroneal             Pt has well healed 2nd toe amp site    ASSESSMENT: [name] is a 51 y.o. female who presents with: left lower  extremity PAD. She has healing dry ulcers on lat aspect of left foot and ankle secondary to pressure from external rotation of left leg after CVA. ABI's showed 0.5 toe pressures, this should be enough flow to heel these ulcers DW Dr. Bridgett Larsson- this pt is not a revascularization candidate. If her symptoms progress to rest pain or osteo may need amputation  Instructions - cont wound care. Keep lateral foot off of ulcers to enhance healing. Roll pt to the right to help keep pressure off these areas. Moisturize  dry skin BID Clinic MD: CSD

## 2011-07-12 ENCOUNTER — Ambulatory Visit (INDEPENDENT_AMBULATORY_CARE_PROVIDER_SITE_OTHER): Payer: Medicaid Other | Admitting: Cardiovascular Disease

## 2011-07-12 ENCOUNTER — Encounter: Payer: Self-pay | Admitting: Cardiovascular Disease

## 2011-07-12 DIAGNOSIS — E785 Hyperlipidemia, unspecified: Secondary | ICD-10-CM

## 2011-07-12 DIAGNOSIS — I6350 Cerebral infarction due to unspecified occlusion or stenosis of unspecified cerebral artery: Secondary | ICD-10-CM

## 2011-07-12 DIAGNOSIS — I428 Other cardiomyopathies: Secondary | ICD-10-CM

## 2011-07-12 DIAGNOSIS — I739 Peripheral vascular disease, unspecified: Secondary | ICD-10-CM

## 2011-07-12 DIAGNOSIS — I10 Essential (primary) hypertension: Secondary | ICD-10-CM

## 2011-07-12 NOTE — Assessment & Plan Note (Signed)
Still with lifestyle limiting deficits.  PT/OT  ? Needs air mattress

## 2011-07-12 NOTE — Assessment & Plan Note (Addendum)
Cholesterol is not at goal.  Increase current dose of statin and diet Rx.  No myalgias or side effects.  F/U  LFT's in 6 months.    Lab Results  Component Value Date   LDLCALC 158* 04/23/2011   Simvastatin increase from 20 mg to 40mg 

## 2011-07-12 NOTE — Assessment & Plan Note (Signed)
Biggest issue She will likely need amputation of left foot in future.  Continue dressing changes and F/U with Dr Bridgett Larsson

## 2011-07-12 NOTE — Assessment & Plan Note (Signed)
Stable with no clinical symtptoms  Essentially wheelchair bound so no issue with activity limitations

## 2011-07-12 NOTE — Progress Notes (Signed)
[name] is a 51 y.o. female who presents for post hospital follow up. She has a history of hypertension, hyperlipidemia, diabetes, prior stroke. She was admitted 11/11-11/15 with right MCA distribution stroke. Echocardiogram demonstrated depressed LV function, which was new. Echocardiogram 04/23/11: Mild LVH, EF 30-35%, mild MR, mild LAE, mild-moderate RVE. TEE 04/25/11: Moderate LVH, severe biventricular dysfunction, EF 20-25%, moderate to severe MR, moderate TR, biatrial enlargement, no PFO, negative bubble study. Carotid Dopplers were negative. ABIs did demonstrate posterior tibial disease rated as severe. She has a wound on her left foot that is being followed by vascular surgery. She had heavy pooling of blood on TEE and Coumadin therapy was initiated. She was discharged to inpatient rehabilitation and discharged 05/16/11.   She is currently living at [facility]. She is here with her sister today. Her left side is still weak. She denies significant shortness of breath. She denies chest pain. She denies orthopnea, PND or edema. She denies syncope. She seems to be doing well with physical therapy.  Has had bilateral claudication.  Nonhealing ulcers right LE.  Sees Dr Bridgett Larsson.  Not revascularization candidate.  Toe index on right is .5 should be sufficient for healing.  ROS: Denies fever, malais, weight loss, blurry vision, decreased visual acuity, cough, sputum, SOB, hemoptysis, pleuritic pain, palpitaitons, heartburn, abdominal pain, melena, lower extremity edema, claudication, or rash.  All other systems reviewed and negative  General: Affect appropriate Chronically ill obese black female HEENT: normal Neck supple with no adenopathy JVP normal no bruits no thyromegaly Lungs clear with no wheezing and good diaphragmatic motion Heart:  S1/S2 MR  murmur, no rub, gallop or click PMI normal Abdomen: benighn, BS positve, no tenderness, no AAA no bruit.  No HSM or HJR Distal pulses none in  left foot with femoral  bruits No edema Neuro left sided weakness Skin warm and dry No muscular weakness   Current Outpatient Prescriptions  Medication Sig Dispense Refill  . acetaminophen (TYLENOL) 650 MG CR tablet Take 650 mg by mouth every 8 (eight) hours as needed.        . Alum & Mag Hydroxide-Simeth (MAALOX PLUS PO) Take by mouth as needed.        Marland Kitchen amitriptyline (ELAVIL) 50 MG tablet Take 50 mg by mouth at bedtime. For nerves/rest       . aspirin 325 MG EC tablet Take 325 mg by mouth daily.        . Biotin 5 MG CAPS Take by mouth.        . citalopram (CELEXA) 10 MG tablet Take 10 mg by mouth daily.        . cycloSPORINE (RESTASIS) 0.05 % ophthalmic emulsion Place 1 drop into both eyes 2 (two) times daily.        . ferrous sulfate 325 (65 FE) MG tablet Take 325 mg by mouth 2 (two) times daily.        . furosemide (LASIX) 20 MG tablet Take 20 mg by mouth daily.        Marland Kitchen gabapentin (NEURONTIN) 600 MG tablet Take 600 mg by mouth. 4 times per day      . glimepiride (AMARYL) 2 MG tablet Take 2 mg by mouth daily before breakfast.        . hydrochlorothiazide (MICROZIDE) 12.5 MG capsule Take 12.5 mg by mouth daily.        Marland Kitchen losartan (COZAAR) 100 MG tablet Take 100 mg by mouth daily.        Marland Kitchen  methocarbamol (ROBAXIN) 500 MG tablet Take 500 mg by mouth 4 (four) times daily.        . methylphenidate (RITALIN) 10 MG tablet Take 10 mg by mouth 2 (two) times daily.        . metoprolol (LOPRESSOR) 100 MG tablet Take 1 tablet (100 mg total) by mouth 2 (two) times daily.      . simvastatin (ZOCOR) 20 MG tablet Take 20 mg by mouth at bedtime.        Marland Kitchen warfarin (COUMADIN) 5 MG tablet Take 5 mg by mouth as directed.          Allergies  Ace inhibitors and Vancomycin  Electrocardiogram:   Assessment and Plan

## 2011-07-12 NOTE — Assessment & Plan Note (Signed)
Well controlled.  Continue current medications and low sodium Dash type diet.    

## 2011-07-12 NOTE — Patient Instructions (Addendum)
Your physician wants you to follow-up in: [place] will receive a reminder letter in the mail two months in advance. If you don't receive a letter, please call our office to schedule the follow-up appointment. Your physician has recommended you make the following change in your medication: INCREASE SIMVASTATIN TO 40 MG 1 AT BEDTIME

## 2011-07-17 ENCOUNTER — Encounter: Payer: Medicaid Other | Attending: Physical Medicine & Rehabilitation

## 2011-07-17 ENCOUNTER — Ambulatory Visit (HOSPITAL_BASED_OUTPATIENT_CLINIC_OR_DEPARTMENT_OTHER): Payer: Medicaid Other | Admitting: Physical Medicine & Rehabilitation

## 2011-07-17 ENCOUNTER — Encounter (HOSPITAL_BASED_OUTPATIENT_CLINIC_OR_DEPARTMENT_OTHER): Payer: Medicaid Other | Attending: General Surgery

## 2011-07-17 DIAGNOSIS — I69959 Hemiplegia and hemiparesis following unspecified cerebrovascular disease affecting unspecified side: Secondary | ICD-10-CM | POA: Insufficient documentation

## 2011-07-17 DIAGNOSIS — I10 Essential (primary) hypertension: Secondary | ICD-10-CM | POA: Insufficient documentation

## 2011-07-17 DIAGNOSIS — H5340 Unspecified visual field defects: Secondary | ICD-10-CM | POA: Insufficient documentation

## 2011-07-17 DIAGNOSIS — G8110 Spastic hemiplegia affecting unspecified side: Secondary | ICD-10-CM

## 2011-07-17 DIAGNOSIS — L89899 Pressure ulcer of other site, unspecified stage: Secondary | ICD-10-CM | POA: Insufficient documentation

## 2011-07-17 DIAGNOSIS — H539 Unspecified visual disturbance: Secondary | ICD-10-CM | POA: Insufficient documentation

## 2011-07-17 DIAGNOSIS — M719 Bursopathy, unspecified: Secondary | ICD-10-CM

## 2011-07-17 DIAGNOSIS — I69998 Other sequelae following unspecified cerebrovascular disease: Secondary | ICD-10-CM | POA: Insufficient documentation

## 2011-07-17 DIAGNOSIS — L97509 Non-pressure chronic ulcer of other part of unspecified foot with unspecified severity: Secondary | ICD-10-CM | POA: Insufficient documentation

## 2011-07-17 DIAGNOSIS — E1149 Type 2 diabetes mellitus with other diabetic neurological complication: Secondary | ICD-10-CM | POA: Insufficient documentation

## 2011-07-17 DIAGNOSIS — E1169 Type 2 diabetes mellitus with other specified complication: Secondary | ICD-10-CM | POA: Insufficient documentation

## 2011-07-17 DIAGNOSIS — Z79899 Other long term (current) drug therapy: Secondary | ICD-10-CM | POA: Insufficient documentation

## 2011-07-17 DIAGNOSIS — E119 Type 2 diabetes mellitus without complications: Secondary | ICD-10-CM | POA: Insufficient documentation

## 2011-07-17 DIAGNOSIS — L89509 Pressure ulcer of unspecified ankle, unspecified stage: Secondary | ICD-10-CM | POA: Insufficient documentation

## 2011-07-17 DIAGNOSIS — L8990 Pressure ulcer of unspecified site, unspecified stage: Secondary | ICD-10-CM | POA: Insufficient documentation

## 2011-07-17 DIAGNOSIS — M67919 Unspecified disorder of synovium and tendon, unspecified shoulder: Secondary | ICD-10-CM

## 2011-07-17 DIAGNOSIS — Z7901 Long term (current) use of anticoagulants: Secondary | ICD-10-CM | POA: Insufficient documentation

## 2011-07-17 DIAGNOSIS — E1142 Type 2 diabetes mellitus with diabetic polyneuropathy: Secondary | ICD-10-CM | POA: Insufficient documentation

## 2011-07-17 NOTE — Assessment & Plan Note (Signed)
HISTORY:  A 51 year old female, who had a right MCA distribution infarct with left hemiplegia, left hemineglect, left homonymous hemianopsia onset November 2012, admitted June 11, 2011, admitted to rehab November 15 and discharged May 16, 2011.  She has been in a skilled nursing facility.  She has had no new problems other than left shoulder pain.  Pain level 8/10.  The pain is worse with walking, sitting; improves with rest and medications.  She gets up with therapy very limited basis in terms of her standing.  She really does not use left upper extremity.  She has difficulty during dressing tasks with her arm going over head.  She has seen Dr. Bridgett Larsson for her left toe vascular problems.  She has seen the [facility] as well.  REVIEW OF SYSTEMS:  Positive for bladder and bowel control problems, trouble walking, confusion, depression, anxiety, urinary retention and poor appetite.  SOCIAL HISTORY:  Divorced, lives in a skilled nursing facility now.  Her goal is to go back to living with her sister.  PHYSICAL EXAMINATION:  VITAL SIGNS:  Blood pressure 137/86, pulse 95, respiratory rate is 18 and O2 sat 96% on room air.  GENERAL:  No acute distress.  Mood and affect appropriate. EXTREMITIES:  Right upper extremity strength is normal.  Right lower extremity strength is normal.  Left upper extremity strength is 0/5 throughout.  Left lower extremity strength is 2- in hip, knee, extensor synergy and 0 at the ankle and the foot.  Visual fields show left homonymous hemianopsia as well as a left neglect.  IMPRESSION: 1. Right middle cerebral artery distribution infarct with left     hemiparesis 2. Left frozen shoulder.  She has a positive impingements sign as well     indicating that she may have a subacromial bursitis.  PLAN: 1. We will go ahead and inject her shoulder today 2. I will see her back in about 2 months.  I discussed with the     patient and her sister, agree with  plan.     Charlett Blake, M.D. Electronically Signed    AEK/MedQ D:  07/17/2011 15:45:55  T:  07/17/2011 22:13:19  Job #:  [id number]

## 2011-07-18 NOTE — Progress Notes (Signed)
[facility]  NAME:  [name], [name] NO.:  MEDICAL RECORD NO.:  [id number]      DATE OF BIRTH:  1960-08-09  PHYSICIAN:  Elesa Hacker, M.D.         VISIT DATE:  07/17/2011                                  OFFICE VISIT   CHIEF COMPLAINT:  Wounds in the left foot.  HISTORY OF PRESENT ILLNESS:  This is a 51 year old female, diabetes for several years, has had 2 strokes in the last 24 months both have affected the left side with severe weakness of leg and arm. Approximately 2 months ago, she developed a sore at the tip of her left great toe.  She was seen by vascular surgery who determined that she had end-vessel disease which could not be reconstructed and some diabetic neuropathy. Two weeks ago, she developed 2 wounds on the lateral aspect of her lower extremity, one in the midfoot and one at the lateral malleolus.  She was basically pain free.  She has had no systemic symptoms of fever, chills, sweats, or other signs of systemic infection.  PAST MEDICAL HISTORY:  Significant for: 1. Cerebrovascular disease. 2. Diabetes. 3. Peripheral neuropathy. 4. Esophagitis. 5. Anxiety, some depression and muscle spasms.  PAST SURGICAL HISTORY:  Negative.  SOCIAL HISTORY:  Cigarettes, none.  Alcohol, none.  ALLERGIES: 1. VANCOMYCIN. 2. LISINOPRIL.  MEDICATIONS:  Celexa, Amino, warfarin, acetaminophen, Robaxin, Biotin, Cozaar, iron, Lasix, metoprolol, Neurontin for stasis, Ritalin, simvastatin and Maalox.  Other systems essentially are negative.  PHYSICAL EXAMINATION:  GENERAL:  Awake, alert, oriented, obvious drooping of the face with loss of left nasolabial fold. VITAL SIGNS:  Temperature 98.4, pulse 98, respirations 20, blood pressure 123/89, glucose was 91.  CHEST AND HEART:  Clear.  She has weakness on the right side with complete paralysis on the left side. ABDOMEN:  Not examined. EXTREMITIES:  Examination of the extremities reveals  peripheral pulses are palpable.  At the left ankle there is a 1.0 x 13 black eschar at the lateral foot is 1.5 x 0.7 black eschar and the left great toe, there is a 1.3 x 1.6 ulceration.  IMPRESSION:  Diabetic ulcers with neuropathy, decubitus wounds, foot and ankle.  PLAN OF TREATMENT:  I have debrided the wounds, removing callus from the great toe and full-thickness eschars from the malleolus and lateral foot.  The great toe wound probes to bone.  Quite easily in the meantime, we will treat the patients with Santyl and Hydrogel and make a decision about hyperbaric oxygen depending on how things respond to treatment.     Elesa Hacker, M.D.     RA/MEDQ  D:  07/17/2011  T:  07/17/2011  Job:  [id number]

## 2011-08-01 ENCOUNTER — Encounter: Payer: Self-pay | Admitting: Thoracic Diseases

## 2011-08-02 ENCOUNTER — Ambulatory Visit: Payer: Medicaid Other

## 2011-08-03 ENCOUNTER — Encounter: Payer: Self-pay | Admitting: Thoracic Diseases

## 2011-08-03 ENCOUNTER — Ambulatory Visit (INDEPENDENT_AMBULATORY_CARE_PROVIDER_SITE_OTHER): Payer: Medicaid Other | Admitting: Thoracic Diseases

## 2011-08-03 VITALS — BP 108/84 | HR 94 | Resp 16 | Ht 67.0 in | Wt 231.0 lb

## 2011-08-03 DIAGNOSIS — I779 Disorder of arteries and arterioles, unspecified: Secondary | ICD-10-CM

## 2011-08-03 DIAGNOSIS — I739 Peripheral vascular disease, unspecified: Secondary | ICD-10-CM

## 2011-08-03 NOTE — Patient Instructions (Signed)
Continue local care/ keep heel moisturized, reduce pressure on left lat foot and ankle, air mattress overlay

## 2011-08-03 NOTE — Progress Notes (Signed)
[facility]  PVD Follow-up History of Present Illness  [name] is a 51 y.o. female who presents for  follow-up for: left ankle and lateral foot ulcers .  The patient's wounds are stable and cleaning up with Santyl. Moisturizing heel is working well and overall the foot looks better. She denies night or rest pain in the foot  Physical Examination  Filed Vitals:   08/03/11 1333  BP: 108/84  Pulse: 94  Resp: 16    Pt is A&O x 3 left lower extremity: 3 open wounds : lateral malleolus, base and head of 5th metatarsal with exposed bone. These appear cleaner than previous visit with daily use of santyl. No erythema or cellulitis noted The left great toe is stable and the 2nd toe amp is well healed.   Medical Decision Making  [name] is a 51 y.o. year old female who has had some improvement in her lateral ankle and foot wounds on the left. The left great toe is stable and the 2nd toe amp is well healed. Pt is not a revascularization candidate Local wound care is advised at this time. If pt develops osteo and/or infection /cellulitis in the leg she will need amputation If she develops intractable rest pain -this would also be an indication for amputation. We will F/U prn   Clinic MD: CS Dickson,MD

## 2011-08-08 ENCOUNTER — Other Ambulatory Visit (HOSPITAL_COMMUNITY): Payer: Self-pay | Admitting: Orthopedic Surgery

## 2011-08-08 ENCOUNTER — Encounter (HOSPITAL_COMMUNITY): Payer: Self-pay | Admitting: Pharmacy Technician

## 2011-08-09 ENCOUNTER — Other Ambulatory Visit: Payer: Self-pay

## 2011-08-09 ENCOUNTER — Encounter (HOSPITAL_COMMUNITY): Payer: Self-pay | Admitting: Emergency Medicine

## 2011-08-09 ENCOUNTER — Emergency Department (HOSPITAL_COMMUNITY)
Admission: EM | Admit: 2011-08-09 | Discharge: 2011-08-09 | Disposition: A | Discharge status: Other Acute Inpatient Hospital | Payer: Medicaid Other | Attending: Emergency Medicine | Admitting: Emergency Medicine

## 2011-08-09 DIAGNOSIS — N390 Urinary tract infection, site not specified: Secondary | ICD-10-CM | POA: Insufficient documentation

## 2011-08-09 DIAGNOSIS — E785 Hyperlipidemia, unspecified: Secondary | ICD-10-CM | POA: Insufficient documentation

## 2011-08-09 DIAGNOSIS — E119 Type 2 diabetes mellitus without complications: Secondary | ICD-10-CM | POA: Insufficient documentation

## 2011-08-09 DIAGNOSIS — E669 Obesity, unspecified: Secondary | ICD-10-CM | POA: Insufficient documentation

## 2011-08-09 DIAGNOSIS — R1110 Vomiting, unspecified: Secondary | ICD-10-CM | POA: Insufficient documentation

## 2011-08-09 DIAGNOSIS — Z8679 Personal history of other diseases of the circulatory system: Secondary | ICD-10-CM | POA: Insufficient documentation

## 2011-08-09 DIAGNOSIS — I10 Essential (primary) hypertension: Secondary | ICD-10-CM | POA: Insufficient documentation

## 2011-08-09 DIAGNOSIS — K219 Gastro-esophageal reflux disease without esophagitis: Secondary | ICD-10-CM | POA: Insufficient documentation

## 2011-08-09 DIAGNOSIS — G4730 Sleep apnea, unspecified: Secondary | ICD-10-CM | POA: Insufficient documentation

## 2011-08-09 LAB — CBC
HCT: 45.2 % (ref 36.0–46.0)
MCHC: 34.1 g/dL (ref 30.0–36.0)
Platelets: 452 10*3/uL — ABNORMAL HIGH (ref 150–400)
RDW: 16.6 % — ABNORMAL HIGH (ref 11.5–15.5)
WBC: 10.6 10*3/uL — ABNORMAL HIGH (ref 4.0–10.5)

## 2011-08-09 LAB — URINE MICROSCOPIC-ADD ON

## 2011-08-09 LAB — COMPREHENSIVE METABOLIC PANEL
Albumin: 2.1 g/dL — ABNORMAL LOW (ref 3.5–5.2)
BUN: 32 mg/dL — ABNORMAL HIGH (ref 6–23)
Calcium: 8.6 mg/dL (ref 8.4–10.5)
GFR calc Af Amer: 71 mL/min — ABNORMAL LOW (ref >90)
Glucose, Bld: 189 mg/dL — ABNORMAL HIGH (ref 70–99)
Sodium: 141 mEq/L (ref 135–145)
Total Protein: 6.6 g/dL (ref 6.0–8.3)

## 2011-08-09 LAB — DIFFERENTIAL
Basophils Absolute: 0 10*3/uL (ref 0.0–0.1)
Basophils Relative: 0 % (ref 0–1)
Lymphocytes Relative: 15 % (ref 12–46)
Monocytes Absolute: 0.4 10*3/uL (ref 0.1–1.0)
Neutro Abs: 8.5 10*3/uL — ABNORMAL HIGH (ref 1.7–7.7)

## 2011-08-09 LAB — URINALYSIS, ROUTINE W REFLEX MICROSCOPIC
Bilirubin Urine: NEGATIVE
Ketones, ur: NEGATIVE mg/dL
Nitrite: NEGATIVE
Specific Gravity, Urine: 1.024 (ref 1.005–1.030)
Urobilinogen, UA: 0.2 mg/dL (ref 0.0–1.0)

## 2011-08-09 MED ORDER — CIPROFLOXACIN HCL 500 MG PO TABS: 500.0000 mg | ORAL_TABLET | Freq: Two times a day (BID) | ORAL | Status: DC

## 2011-08-09 MED ORDER — METOCLOPRAMIDE HCL 5 MG/ML IJ SOLN
10.0000 mg | Freq: Once | INTRAMUSCULAR | Status: AC
  Administered 2011-08-09: 10 mg via INTRAVENOUS
  Filled 2011-08-09: qty 2

## 2011-08-09 MED ORDER — SODIUM CHLORIDE 0.9 % IV BOLUS (SEPSIS)
1000.0000 mL | Freq: Once | INTRAVENOUS | Status: AC
  Administered 2011-08-09 (×2): 1000 mL via INTRAVENOUS

## 2011-08-09 MED ORDER — ONDANSETRON HCL 4 MG/2ML IJ SOLN
INTRAMUSCULAR | Status: AC
  Filled 2011-08-09: qty 2

## 2011-08-09 MED ORDER — ONDANSETRON HCL 4 MG PO TABS: 4.0000 mg | ORAL_TABLET | Freq: Four times a day (QID) | ORAL | Status: DC

## 2011-08-09 MED ORDER — ONDANSETRON HCL 4 MG/2ML IJ SOLN
4.0000 mg | Freq: Once | INTRAMUSCULAR | Status: AC
  Administered 2011-08-09: 4 mg via INTRAVENOUS

## 2011-08-09 MED ORDER — ONDANSETRON HCL 4 MG PO TABS: 4.0000 mg | ORAL_TABLET | Freq: Four times a day (QID) | ORAL | Status: AC

## 2011-08-09 MED ORDER — LORAZEPAM 2 MG/ML IJ SOLN
1.0000 mg | Freq: Once | INTRAMUSCULAR | Status: AC
  Administered 2011-08-09: 1 mg via INTRAVENOUS
  Filled 2011-08-09: qty 1

## 2011-08-09 MED ORDER — LORAZEPAM 2 MG/ML IJ SOLN
1.0000 mg | Freq: Once | INTRAMUSCULAR | Status: AC
  Administered 2011-08-09: 16:00:00 via INTRAVENOUS
  Filled 2011-08-09: qty 1

## 2011-08-09 MED ORDER — CIPROFLOXACIN HCL 500 MG PO TABS
500.0000 mg | ORAL_TABLET | Freq: Once | ORAL | Status: AC
  Administered 2011-08-09: 500 mg via ORAL
  Filled 2011-08-09: qty 1

## 2011-08-09 NOTE — ED Notes (Signed)
Attempted to obtain urine by cath;  Unsuccessful.  EDP notified.  IL NS initiated.  Pt resting quietly with eyes closed.  Arouses easily to name.  NAD noted.  Evaluation continues.

## 2011-08-09 NOTE — ED Notes (Signed)
Discharge instructions reviewed with pt and family member.  Pt /member verbalizes understanding.  No questions aske; no further c/o's voiced.  PTAR notified of pending discharge from ED.  [facility] notified of pt's return to facility.

## 2011-08-09 NOTE — Discharge Instructions (Signed)
Urinary Tract Infection Infections of the urinary tract can start in several places. A bladder infection (cystitis), a kidney infection (pyelonephritis), and a prostate infection (prostatitis) are different types of urinary tract infections (UTIs). They usually get better if treated with medicines (antibiotics) that kill germs. Take all the medicine until it is gone. You or your child may feel better in a few days, but TAKE ALL MEDICINE or the infection may not respond and may become more difficult to treat. HOME CARE INSTRUCTIONS   Drink enough water and fluids to keep the urine clear or pale yellow. Cranberry juice is especially recommended, in addition to large amounts of water.   Avoid caffeine, tea, and carbonated beverages. They tend to irritate the bladder.   Alcohol may irritate the prostate.   Only take over-the-counter or prescription medicines for pain, discomfort, or fever as directed by your caregiver.  To prevent further infections:  Empty the bladder often. Avoid holding urine for long periods of time.   After a bowel movement, women should cleanse from front to back. Use each tissue only once.   Empty the bladder before and after sexual intercourse.  FINDING OUT THE RESULTS OF YOUR TEST Not all test results are available during your visit. If your or your child's test results are not back during the visit, make an appointment with your caregiver to find out the results. Do not assume everything is normal if you have not heard from your caregiver or the medical facility. It is important for you to follow up on all test results. SEEK MEDICAL CARE IF:   There is back pain.   Your baby is older than 3 months with a rectal temperature of 100.5 F (38.1 C) or higher for more than 1 day.   Your or your child's problems (symptoms) are no better in 3 days. Return sooner if you or your child is getting worse.  SEEK IMMEDIATE MEDICAL CARE IF:   There is severe back pain or lower  abdominal pain.   You or your child develops chills.   You have a fever.   Your baby is older than 3 months with a rectal temperature of 102 F (38.9 C) or higher.   Your baby is 30 months old or younger with a rectal temperature of 100.4 F (38 C) or higher.   There is nausea or vomiting.   There is continued burning or discomfort with urination.  MAKE SURE YOU:   Understand these instructions.   Will watch your condition.   Will get help right away if you are not doing well or get worse.  Document Released: 03/07/2005 Document Revised: 02/07/2011 Document Reviewed: 10/10/2006 [facility] Patient Information 2012 [place].Nausea and Vomiting Nausea is a sick feeling that often comes before throwing up (vomiting). Vomiting is a reflex where stomach contents come out of your mouth. Vomiting can cause severe loss of body fluids (dehydration). Children and elderly adults can become dehydrated quickly, especially if they also have diarrhea. Nausea and vomiting are symptoms of a condition or disease. It is important to find the cause of your symptoms. CAUSES   Direct irritation of the stomach lining. This irritation can result from increased acid production (gastroesophageal reflux disease), infection, food poisoning, taking certain medicines (such as nonsteroidal anti-inflammatory drugs), alcohol use, or tobacco use.   Signals from the brain.These signals could be caused by a headache, heat exposure, an inner ear disturbance, increased pressure in the brain from injury, infection, a tumor, or a  concussion, pain, emotional stimulus, or metabolic problems.   An obstruction in the gastrointestinal tract (bowel obstruction).   Illnesses such as diabetes, hepatitis, gallbladder problems, appendicitis, kidney problems, cancer, sepsis, atypical symptoms of a heart attack, or eating disorders.   Medical treatments such as chemotherapy and radiation.   Receiving medicine that makes you  sleep (general anesthetic) during surgery.  DIAGNOSIS Your caregiver may ask for tests to be done if the problems do not improve after a few days. Tests may also be done if symptoms are severe or if the reason for the nausea and vomiting is not clear. Tests may include:  Urine tests.   Blood tests.   Stool tests.   Cultures (to look for evidence of infection).   X-rays or other imaging studies.  Test results can help your caregiver make decisions about treatment or the need for additional tests. TREATMENT You need to stay well hydrated. Drink frequently but in small amounts.You may wish to drink water, sports drinks, clear broth, or eat frozen ice pops or gelatin dessert to help stay hydrated.When you eat, eating slowly may help prevent nausea.There are also some antinausea medicines that may help prevent nausea. HOME CARE INSTRUCTIONS   Take all medicine as directed by your caregiver.   If you do not have an appetite, do not force yourself to eat. However, you must continue to drink fluids.   If you have an appetite, eat a normal diet unless your caregiver tells you differently.   Eat a variety of complex carbohydrates (rice, wheat, potatoes, bread), lean meats, yogurt, fruits, and vegetables.   Avoid high-fat foods because they are more difficult to digest.   Drink enough water and fluids to keep your urine clear or pale yellow.   If you are dehydrated, ask your caregiver for specific rehydration instructions. Signs of dehydration may include:   Severe thirst.   Dry lips and mouth.   Dizziness.   Dark urine.   Decreasing urine frequency and amount.   Confusion.   Rapid breathing or pulse.  SEEK IMMEDIATE MEDICAL CARE IF:   You have blood or brown flecks (like coffee grounds) in your vomit.   You have black or bloody stools.   You have a severe headache or stiff neck.   You are confused.   You have severe abdominal pain.   You have chest pain or trouble  breathing.   You do not urinate at least once every 8 hours.   You develop cold or clammy skin.   You continue to vomit for longer than 24 to 48 hours.   You have a fever.  MAKE SURE YOU:   Understand these instructions.   Will watch your condition.   Will get help right away if you are not doing well or get worse.  Document Released: 05/28/2005 Document Revised: 02/07/2011 Document Reviewed: 10/25/2010 [facility] Patient Information 2012 [place], [place].

## 2011-08-09 NOTE — ED Notes (Signed)
Pt presents with 2 day h/o vomiting.  Pt reports inability to keep water down.  Pt denies any abdominal pain or diarrhea.  Pt scheduled for L BKA tomorrow.  Pt reports pain to LLE.

## 2011-08-09 NOTE — ED Provider Notes (Signed)
History     CSN: [id number]  Arrival date & time 08/09/11  [id number]   First MD Initiated Contact with Patient 08/09/11 [phone]      Chief Complaint  Patient presents with  . Emesis    (Consider location/radiation/quality/duration/timing/severity/associated sxs/prior treatment) HPI Comments: Patient sent over here from [facility] facility with a two-day history of vomiting. She says it started yesterday. It is nonbloody emesis. She does say it appears to be a little bit green. She denies any diarrhea denies abdominal pain. Denies any fevers denies a chest pain or shortness of breath. Denies any UTI symptoms. She does have a history of diabetes, but says that her sugars have been well-controlled. She is being cared for for her nonhealing ulcers on her left foot and is scheduled for a left foot amputation tomorrow  Patient is a 51 y.o. female presenting with vomiting. The history is provided by the patient.  Emesis  This is a new problem. Pertinent negatives include no abdominal pain, no arthralgias, no chills, no cough, no diarrhea, no fever and no headaches.    Past Medical History  Diagnosis Date  . HTN (hypertension)   . HLD (hyperlipidemia)   . History of methicillin resistant staphylococcus aureus (MRSA)   . Sleep apnea   . CVA (cerebral infarction)   . Anemia   . Tachycardia   . Obesity   . Renal failure     due to vanc toxicity  . Osteomyelitis   . Lower back pain     Chronic  . Idiopathic peripheral neuropathy     NOS  . DJD (degenerative joint disease)   . Headache   . GERD (gastroesophageal reflux disease)   . DM type 2 (diabetes mellitus, type 2)   . Depression   . Anxiety   . Hypokalemia     resolved  . Cerebral infarct     left parietal and bilateral  . Esophagitis     distal  . Fluid overload     compensated  . Urinary retention   . H/O: pneumonia   . History of bacteremia   . Surgery, other elective     of the ear  . Stroke     Hx of  left frontoparietal stroke in 12/11. History of right brain stroke.  . Cardiomyopathy     Echocardiogram 04/23/11: Mild LVH, EF 30-35%, mild MR, mild LAE, mild-moderate RVE.  TEE 04/25/11: Moderate LVH, severe biventricular dysfunction, EF 20-25%, moderate to severe MR, moderate TR, biatrial enlargement, no PFO, negative bubble study    Past Surgical History  Procedure Date  . Tympanoplasty   . Toe amputation     left 2nd toe  . Dilation and curettage of uterus     history  . Tee without cardioversion 04/25/2011    Procedure: TRANSESOPHAGEAL ECHOCARDIOGRAM (TEE);  Surgeon: Jolaine Artist, MD;  Location: [facility] ENDOSCOPY;  Service: Cardiovascular;  Laterality: N/A;    Family History  Problem Relation Age of Onset  . Coronary artery disease    . Diabetes    . Cancer Mother   . Diabetes Mother   . Heart disease Father   . Hyperlipidemia Father   . Hypertension Father   . Stroke Father   . Heart disease Brother     History  Substance Use Topics  . Smoking status: Never Smoker   . Smokeless tobacco: Never Used  . Alcohol Use: Yes     rare on holidays    OB  History    Grav Para Term Preterm Abortions TAB SAB Ect Mult Living                  Review of Systems  Constitutional: Negative for fever, chills, diaphoresis and fatigue.  HENT: Negative for congestion, rhinorrhea and sneezing.   Eyes: Negative.   Respiratory: Negative for cough, chest tightness and shortness of breath.   Cardiovascular: Negative for chest pain and leg swelling.  Gastrointestinal: Positive for nausea and vomiting. Negative for abdominal pain, diarrhea and blood in stool.  Genitourinary: Negative for frequency, hematuria, flank pain and difficulty urinating.  Musculoskeletal: Negative for back pain and arthralgias.  Skin: Negative for rash.  Neurological: Negative for dizziness, speech difficulty, weakness, numbness and headaches.    Allergies  Ace inhibitors and Vancomycin  Home Medications     Current Outpatient Rx  Name Route Sig Dispense Refill  . ACETAMINOPHEN ER 650 MG PO TBCR Oral Take 650 mg by mouth every 4 (four) hours as needed. For chronic pain.    Marland Kitchen MAALOX PLUS PO Oral Take 30 mLs by mouth 4 (four) times daily as needed. For indigestion.    Marland Kitchen BIOTIN PO Oral Take 15 mLs by mouth 2 (two) times daily. Biotin Suspension.    Marland Kitchen CITALOPRAM HYDROBROMIDE 10 MG PO TABS Oral Take 10 mg by mouth at bedtime.     . COLLAGENASE 250 UNIT/GM EX OINT Topical Apply 1 application topically daily. Apply to left great toe and to left foot.    . CYCLOSPORINE 0.05 % OP EMUL Both Eyes Place 1 drop into both eyes 2 (two) times daily.     Marland Kitchen FERROUS SULFATE 325 (65 FE) MG PO TABS Oral Take 325 mg by mouth 2 (two) times daily.     . FUROSEMIDE 20 MG PO TABS Oral Take 20 mg by mouth daily.      Marland Kitchen GABAPENTIN 300 MG PO CAPS Oral Take 600 mg by mouth 4 (four) times daily.    Marland Kitchen GLIMEPIRIDE 2 MG PO TABS Oral Take 2 mg by mouth daily before breakfast.      . HYDROCODONE-ACETAMINOPHEN 5-500 MG PO TABS Oral Take 1-2 tablets by mouth every 4 (four) hours. For pain    . LOSARTAN POTASSIUM 100 MG PO TABS Oral Take 100 mg by mouth daily.      Marland Kitchen METHOCARBAMOL 500 MG PO TABS Oral Take 500 mg by mouth every 6 (six) hours as needed. For muscle spasms.    . METHYLPHENIDATE HCL 10 MG PO TABS Oral Take 10 mg by mouth 2 (two) times daily.      Marland Kitchen METOPROLOL TARTRATE 100 MG PO TABS Oral Take 100 mg by mouth 2 (two) times daily.    Marland Kitchen SIMVASTATIN 40 MG PO TABS Oral Take 40 mg by mouth at bedtime.    . WARFARIN SODIUM 5 MG PO TABS Oral Take 5 mg by mouth every evening.     Marland Kitchen CIPROFLOXACIN HCL 500 MG PO TABS Oral Take 1 tablet (500 mg total) by mouth 2 (two) times daily. 14 tablet 0  . ONDANSETRON HCL 4 MG PO TABS Oral Take 1 tablet (4 mg total) by mouth every 6 (six) hours. 12 tablet 0    BP 198/114  Pulse 124  Temp(Src) 97.4 F (36.3 C) (Oral)  Resp 17  Ht 5\' 7"  (1.702 m)  SpO2 100%  Physical Exam   Constitutional: She is oriented to person, place, and time. She appears well-developed and well-nourished.  HENT:  Head: Normocephalic and atraumatic.       Slightly dry mucous membranes  Eyes: Pupils are equal, round, and reactive to light.  Neck: Normal range of motion. Neck supple.  Cardiovascular: Normal rate, regular rhythm and normal heart sounds.   Pulmonary/Chest: Effort normal and breath sounds normal. No respiratory distress. She has no wheezes. She has no rales. She exhibits no tenderness.  Abdominal: Soft. Bowel sounds are normal. There is no tenderness. There is no rebound and no guarding.  Musculoskeletal: Normal range of motion. She exhibits no edema.  Lymphadenopathy:    She has no cervical adenopathy.  Neurological: She is alert and oriented to person, place, and time.  Skin: Skin is warm and dry. No rash noted.  Psychiatric: She has a normal mood and affect.    ED Course  Procedures (including critical care time)  Results for orders placed during the hospital encounter of 08/09/11  CBC      Component Value Range   WBC 10.6 (*) 4.0 - 10.5 (K/uL)   RBC 5.43 (*) 3.87 - 5.11 (MIL/uL)   Hemoglobin 15.4 (*) 12.0 - 15.0 (g/dL)   HCT 45.2  36.0 - 46.0 (%)   MCV 83.2  78.0 - 100.0 (fL)   MCH 28.4  26.0 - 34.0 (pg)   MCHC 34.1  30.0 - 36.0 (g/dL)   RDW 16.6 (*) 11.5 - 15.5 (%)   Platelets 452 (*) 150 - 400 (K/uL)  DIFFERENTIAL      Component Value Range   Neutrophils Relative 81 (*) 43 - 77 (%)   Neutro Abs 8.5 (*) 1.7 - 7.7 (K/uL)   Lymphocytes Relative 15  12 - 46 (%)   Lymphs Abs 1.6  0.7 - 4.0 (K/uL)   Monocytes Relative 4  3 - 12 (%)   Monocytes Absolute 0.4  0.1 - 1.0 (K/uL)   Eosinophils Relative 0  0 - 5 (%)   Eosinophils Absolute 0.0  0.0 - 0.7 (K/uL)   Basophils Relative 0  0 - 1 (%)   Basophils Absolute 0.0  0.0 - 0.1 (K/uL)  URINALYSIS, ROUTINE W REFLEX MICROSCOPIC      Component Value Range   Color, Urine YELLOW  YELLOW    APPearance TURBID (*)  CLEAR    Specific Gravity, Urine 1.024  1.005 - 1.030    pH 5.5  5.0 - 8.0    Glucose, UA NEGATIVE  NEGATIVE (mg/dL)   Hgb urine dipstick LARGE (*) NEGATIVE    Bilirubin Urine NEGATIVE  NEGATIVE    Ketones, ur NEGATIVE  NEGATIVE (mg/dL)   Protein, ur >300 (*) NEGATIVE (mg/dL)   Urobilinogen, UA 0.2  0.0 - 1.0 (mg/dL)   Nitrite NEGATIVE  NEGATIVE    Leukocytes, UA MODERATE (*) NEGATIVE   LIPASE, BLOOD      Component Value Range   Lipase 12  11 - 59 (U/L)  COMPREHENSIVE METABOLIC PANEL      Component Value Range   Sodium 141  135 - 145 (mEq/L)   Potassium 4.2  3.5 - 5.1 (mEq/L)   Chloride 108  96 - 112 (mEq/L)   CO2 20  19 - 32 (mEq/L)   Glucose, Bld 189 (*) 70 - 99 (mg/dL)   BUN 32 (*) 6 - 23 (mg/dL)   Creatinine, Ser 1.06  0.50 - 1.10 (mg/dL)   Calcium 8.6  8.4 - 10.5 (mg/dL)   Total Protein 6.6  6.0 - 8.3 (g/dL)   Albumin 2.1 (*) 3.5 - 5.2 (g/dL)  AST 13  0 - 37 (U/L)   ALT 14  0 - 35 (U/L)   Alkaline Phosphatase 95  39 - 117 (U/L)   Total Bilirubin 0.4  0.3 - 1.2 (mg/dL)   GFR calc non Af Amer 61 (*) >90 (mL/min)   GFR calc Af Amer 71 (*) >90 (mL/min)  URINE MICROSCOPIC-ADD ON      Component Value Range   Squamous Epithelial / LPF RARE  RARE    WBC, UA 3-6  <3 (WBC/hpf)   RBC / HPF 0-2  <3 (RBC/hpf)   Bacteria, UA MANY (*) RARE    Urine-Other AMORPHOUS URATES/PHOSPHATES     No results found.  Date: 08/09/2011  Rate: 120  Rhythm: sinus tachycardia  QRS Axis: left  Intervals: normal  ST/T Wave abnormalities: nonspecific ST/T changes  Conduction Disutrbances:left anterior fascicular block  Narrative Interpretation:   Old EKG Reviewed: unchanged     1. Vomiting   2. UTI (lower urinary tract infection)       MDM  Pt given IVFs, zofran.  Feeling much better.  No vomiting, will d/c.  F/u as needed.  No abd pain to suggest other intraabdominal pathology.        Malvin Johns, MD 08/09/11 1606

## 2011-08-09 NOTE — ED Notes (Signed)
Per EMS:  Pt comes from [facility] facility.  Pt has been vomiting since 27 February and has been having loose stools this morning.  Pt has not been febrile.

## 2011-08-09 NOTE — ED Notes (Signed)
Family at bedside. 

## 2011-08-09 NOTE — ED Notes (Signed)
Unable to cath no urine return,pt.was soil with stool.changed pt.

## 2011-08-09 NOTE — ED Notes (Signed)
PTAR notified for pt transport to [facility] on [address].

## 2011-08-09 NOTE — ED Notes (Signed)
Received a call from lab.  Lipase and CMP were hemolyzed.  Will need to be redrawn.

## 2011-08-10 ENCOUNTER — Encounter (HOSPITAL_COMMUNITY): Admission: RE | Payer: Self-pay | Source: Ambulatory Visit

## 2011-08-10 ENCOUNTER — Ambulatory Visit: Payer: Medicaid Other | Admitting: Vascular Surgery

## 2011-08-10 ENCOUNTER — Ambulatory Visit (HOSPITAL_COMMUNITY): Admission: RE | Admit: 2011-08-10 | Payer: Medicaid Other | Source: Ambulatory Visit | Admitting: Orthopedic Surgery

## 2011-08-10 SURGERY — AMPUTATION BELOW KNEE
Anesthesia: General | Site: Leg Lower | Laterality: Left

## 2011-08-13 ENCOUNTER — Encounter (HOSPITAL_COMMUNITY): Payer: Self-pay

## 2011-08-13 ENCOUNTER — Inpatient Hospital Stay (HOSPITAL_COMMUNITY)
Admission: EM | Admit: 2011-08-13 | Discharge: 2011-08-16 | DRG: 378 | Disposition: A | Discharge status: Skilled Nursing Facility | Payer: Medicaid Other | Source: Ambulatory Visit | Attending: Family Medicine | Admitting: Family Medicine

## 2011-08-13 DIAGNOSIS — M545 Low back pain, unspecified: Secondary | ICD-10-CM

## 2011-08-13 DIAGNOSIS — D649 Anemia, unspecified: Secondary | ICD-10-CM

## 2011-08-13 DIAGNOSIS — G609 Hereditary and idiopathic neuropathy, unspecified: Secondary | ICD-10-CM

## 2011-08-13 DIAGNOSIS — F329 Major depressive disorder, single episode, unspecified: Secondary | ICD-10-CM

## 2011-08-13 DIAGNOSIS — Z8673 Personal history of transient ischemic attack (TIA), and cerebral infarction without residual deficits: Secondary | ICD-10-CM

## 2011-08-13 DIAGNOSIS — E119 Type 2 diabetes mellitus without complications: Secondary | ICD-10-CM

## 2011-08-13 DIAGNOSIS — K922 Gastrointestinal hemorrhage, unspecified: Secondary | ICD-10-CM

## 2011-08-13 DIAGNOSIS — E669 Obesity, unspecified: Secondary | ICD-10-CM

## 2011-08-13 DIAGNOSIS — L8992 Pressure ulcer of unspecified site, stage 2: Secondary | ICD-10-CM | POA: Diagnosis present

## 2011-08-13 DIAGNOSIS — I82409 Acute embolism and thrombosis of unspecified deep veins of unspecified lower extremity: Secondary | ICD-10-CM

## 2011-08-13 DIAGNOSIS — F3289 Other specified depressive episodes: Secondary | ICD-10-CM

## 2011-08-13 DIAGNOSIS — M6281 Muscle weakness (generalized): Secondary | ICD-10-CM | POA: Diagnosis present

## 2011-08-13 DIAGNOSIS — N19 Unspecified kidney failure: Secondary | ICD-10-CM

## 2011-08-13 DIAGNOSIS — I69998 Other sequelae following unspecified cerebrovascular disease: Secondary | ICD-10-CM | POA: Diagnosis present

## 2011-08-13 DIAGNOSIS — I5022 Chronic systolic (congestive) heart failure: Secondary | ICD-10-CM | POA: Diagnosis present

## 2011-08-13 DIAGNOSIS — E118 Type 2 diabetes mellitus with unspecified complications: Secondary | ICD-10-CM | POA: Diagnosis present

## 2011-08-13 DIAGNOSIS — I513 Intracardiac thrombosis, not elsewhere classified: Secondary | ICD-10-CM

## 2011-08-13 DIAGNOSIS — R000 Tachycardia, unspecified: Secondary | ICD-10-CM

## 2011-08-13 DIAGNOSIS — M869 Osteomyelitis, unspecified: Secondary | ICD-10-CM

## 2011-08-13 DIAGNOSIS — G4730 Sleep apnea, unspecified: Secondary | ICD-10-CM

## 2011-08-13 DIAGNOSIS — L89899 Pressure ulcer of other site, unspecified stage: Secondary | ICD-10-CM | POA: Diagnosis present

## 2011-08-13 DIAGNOSIS — I739 Peripheral vascular disease, unspecified: Secondary | ICD-10-CM

## 2011-08-13 DIAGNOSIS — K219 Gastro-esophageal reflux disease without esophagitis: Secondary | ICD-10-CM

## 2011-08-13 DIAGNOSIS — N390 Urinary tract infection, site not specified: Secondary | ICD-10-CM

## 2011-08-13 DIAGNOSIS — G8929 Other chronic pain: Secondary | ICD-10-CM | POA: Diagnosis present

## 2011-08-13 DIAGNOSIS — I5189 Other ill-defined heart diseases: Secondary | ICD-10-CM | POA: Diagnosis present

## 2011-08-13 DIAGNOSIS — I6529 Occlusion and stenosis of unspecified carotid artery: Secondary | ICD-10-CM

## 2011-08-13 DIAGNOSIS — I96 Gangrene, not elsewhere classified: Secondary | ICD-10-CM | POA: Diagnosis present

## 2011-08-13 DIAGNOSIS — R51 Headache: Secondary | ICD-10-CM

## 2011-08-13 DIAGNOSIS — N179 Acute kidney failure, unspecified: Secondary | ICD-10-CM

## 2011-08-13 DIAGNOSIS — I639 Cerebral infarction, unspecified: Secondary | ICD-10-CM

## 2011-08-13 DIAGNOSIS — M1990 Unspecified osteoarthritis, unspecified site: Secondary | ICD-10-CM

## 2011-08-13 DIAGNOSIS — L02619 Cutaneous abscess of unspecified foot: Secondary | ICD-10-CM | POA: Diagnosis present

## 2011-08-13 DIAGNOSIS — T45515A Adverse effect of anticoagulants, initial encounter: Secondary | ICD-10-CM | POA: Diagnosis present

## 2011-08-13 DIAGNOSIS — F411 Generalized anxiety disorder: Secondary | ICD-10-CM

## 2011-08-13 DIAGNOSIS — E876 Hypokalemia: Secondary | ICD-10-CM | POA: Diagnosis not present

## 2011-08-13 DIAGNOSIS — D62 Acute posthemorrhagic anemia: Secondary | ICD-10-CM

## 2011-08-13 DIAGNOSIS — K921 Melena: Principal | ICD-10-CM | POA: Diagnosis present

## 2011-08-13 DIAGNOSIS — T45511A Poisoning by anticoagulants, accidental (unintentional), initial encounter: Secondary | ICD-10-CM

## 2011-08-13 DIAGNOSIS — K2970 Gastritis, unspecified, without bleeding: Secondary | ICD-10-CM | POA: Diagnosis present

## 2011-08-13 DIAGNOSIS — I10 Essential (primary) hypertension: Secondary | ICD-10-CM

## 2011-08-13 DIAGNOSIS — R791 Abnormal coagulation profile: Secondary | ICD-10-CM | POA: Diagnosis present

## 2011-08-13 DIAGNOSIS — L03119 Cellulitis of unspecified part of limb: Secondary | ICD-10-CM | POA: Diagnosis present

## 2011-08-13 DIAGNOSIS — E785 Hyperlipidemia, unspecified: Secondary | ICD-10-CM

## 2011-08-13 DIAGNOSIS — I6350 Cerebral infarction due to unspecified occlusion or stenosis of unspecified cerebral artery: Secondary | ICD-10-CM

## 2011-08-13 LAB — BASIC METABOLIC PANEL
BUN: 33 mg/dL — ABNORMAL HIGH (ref 6–23)
Calcium: 8.4 mg/dL (ref 8.4–10.5)
Chloride: 107 mEq/L (ref 96–112)
Creatinine, Ser: 1.31 mg/dL — ABNORMAL HIGH (ref 0.50–1.10)
GFR calc Af Amer: 55 mL/min — ABNORMAL LOW (ref >90)
GFR calc non Af Amer: 47 mL/min — ABNORMAL LOW (ref >90)

## 2011-08-13 LAB — URINALYSIS, ROUTINE W REFLEX MICROSCOPIC
Bilirubin Urine: NEGATIVE
Ketones, ur: NEGATIVE mg/dL
Nitrite: NEGATIVE
pH: 6.5 (ref 5.0–8.0)

## 2011-08-13 LAB — CBC
HCT: 36.9 % (ref 36.0–46.0)
MCH: 26.5 pg (ref 26.0–34.0)
MCHC: 31.4 g/dL (ref 30.0–36.0)
MCV: 84.4 fL (ref 78.0–100.0)
RDW: 16.5 % — ABNORMAL HIGH (ref 11.5–15.5)

## 2011-08-13 LAB — MRSA PCR SCREENING: MRSA by PCR: NEGATIVE

## 2011-08-13 LAB — URINE MICROSCOPIC-ADD ON

## 2011-08-13 MED ORDER — THERA M PLUS PO TABS: 2.0000 | ORAL_TABLET | Freq: Every day | ORAL | Status: DC

## 2011-08-13 MED ORDER — INSULIN ASPART 100 UNIT/ML ~~LOC~~ SOLN
0.0000 [IU] | Freq: Three times a day (TID) | SUBCUTANEOUS | Status: DC
  Filled 2011-08-13: qty 3

## 2011-08-13 MED ORDER — HYDROCODONE-ACETAMINOPHEN 5-325 MG PO TABS
1.0000 | ORAL_TABLET | ORAL | Status: DC | PRN
  Administered 2011-08-13: 21:00:00 2 via ORAL
  Administered 2011-08-14: 1 via ORAL
  Filled 2011-08-13: qty 1
  Filled 2011-08-13: qty 2

## 2011-08-13 MED ORDER — ONDANSETRON HCL 4 MG PO TABS: 4.0000 mg | ORAL_TABLET | Freq: Four times a day (QID) | ORAL | Status: DC | PRN

## 2011-08-13 MED ORDER — DEXTROSE 5 % IV SOLN
2.0000 g | INTRAVENOUS | Status: AC
  Administered 2011-08-14: 2 g via INTRAVENOUS
  Filled 2011-08-13 (×2): qty 2

## 2011-08-13 MED ORDER — FERROUS SULFATE 325 (65 FE) MG PO TABS
325.0000 mg | ORAL_TABLET | Freq: Two times a day (BID) | ORAL | Status: DC
  Administered 2011-08-13 – 2011-08-14 (×3): 325 mg via ORAL
  Filled 2011-08-13 (×5): qty 1

## 2011-08-13 MED ORDER — SODIUM CHLORIDE 0.9 % IV SOLN
INTRAVENOUS | Status: DC
  Administered 2011-08-14: 1000 mL via INTRAVENOUS
  Administered 2011-08-14 (×2): via INTRAVENOUS
  Administered 2011-08-15: 20 mL/h via INTRAVENOUS

## 2011-08-13 MED ORDER — PANTOPRAZOLE SODIUM 40 MG IV SOLR
40.0000 mg | Freq: Two times a day (BID) | INTRAVENOUS | Status: DC
  Administered 2011-08-13 – 2011-08-16 (×6): 40 mg via INTRAVENOUS
  Filled 2011-08-13 (×7): qty 40

## 2011-08-13 MED ORDER — ALPRAZOLAM 0.25 MG PO TABS
0.2500 mg | ORAL_TABLET | Freq: Three times a day (TID) | ORAL | Status: DC | PRN
  Administered 2011-08-13 – 2011-08-15 (×4): 0.25 mg via ORAL
  Filled 2011-08-13 (×4): qty 1

## 2011-08-13 MED ORDER — FUROSEMIDE 10 MG/ML IJ SOLN
20.0000 mg | Freq: Once | INTRAMUSCULAR | Status: AC
  Administered 2011-08-13: 20 mg via INTRAVENOUS
  Filled 2011-08-13: qty 2

## 2011-08-13 MED ORDER — DEXTROSE 5 % IV SOLN
2.0000 g | Freq: Two times a day (BID) | INTRAVENOUS | Status: DC
  Administered 2011-08-14 – 2011-08-15 (×3): 2 g via INTRAVENOUS
  Filled 2011-08-13 (×4): qty 2

## 2011-08-13 MED ORDER — CITALOPRAM HYDROBROMIDE 10 MG PO TABS
10.0000 mg | ORAL_TABLET | Freq: Every day | ORAL | Status: DC
  Administered 2011-08-13 – 2011-08-15 (×3): 10 mg via ORAL
  Filled 2011-08-13 (×4): qty 1

## 2011-08-13 MED ORDER — GABAPENTIN 300 MG PO CAPS
600.0000 mg | ORAL_CAPSULE | Freq: Four times a day (QID) | ORAL | Status: DC
  Administered 2011-08-13 – 2011-08-16 (×11): 600 mg via ORAL
  Filled 2011-08-13 (×13): qty 2

## 2011-08-13 MED ORDER — METHOCARBAMOL 500 MG PO TABS: 500.0000 mg | ORAL_TABLET | Freq: Four times a day (QID) | ORAL | Status: DC | PRN

## 2011-08-13 MED ORDER — ACETAMINOPHEN 650 MG RE SUPP: 650.0000 mg | Freq: Four times a day (QID) | RECTAL | Status: DC | PRN

## 2011-08-13 MED ORDER — DIPHENHYDRAMINE HCL 50 MG/ML IJ SOLN
25.0000 mg | Freq: Once | INTRAMUSCULAR | Status: AC
  Administered 2011-08-13: 25 mg via INTRAVENOUS
  Filled 2011-08-13: qty 1

## 2011-08-13 MED ORDER — ADULT MULTIVITAMIN W/MINERALS CH
1.0000 | ORAL_TABLET | Freq: Two times a day (BID) | ORAL | Status: DC
  Administered 2011-08-13 – 2011-08-16 (×6): 1 via ORAL
  Filled 2011-08-13 (×7): qty 1

## 2011-08-13 MED ORDER — COLLAGENASE 250 UNIT/GM EX OINT
1.0000 "application " | TOPICAL_OINTMENT | Freq: Every day | CUTANEOUS | Status: DC
  Administered 2011-08-13 – 2011-08-16 (×4): 1 via TOPICAL
  Filled 2011-08-13: qty 30

## 2011-08-13 MED ORDER — ONDANSETRON HCL 4 MG/2ML IJ SOLN: 4.0000 mg | Freq: Four times a day (QID) | INTRAMUSCULAR | Status: DC | PRN

## 2011-08-13 MED ORDER — ALBUTEROL SULFATE (5 MG/ML) 0.5% IN NEBU: 2.5000 mg | INHALATION_SOLUTION | RESPIRATORY_TRACT | Status: DC | PRN

## 2011-08-13 MED ORDER — SIMVASTATIN 40 MG PO TABS
40.0000 mg | ORAL_TABLET | Freq: Every day | ORAL | Status: DC
  Administered 2011-08-13 – 2011-08-15 (×3): 40 mg via ORAL
  Filled 2011-08-13 (×4): qty 1

## 2011-08-13 MED ORDER — PHYTONADIONE 5 MG PO TABS
5.0000 mg | ORAL_TABLET | Freq: Once | ORAL | Status: AC
  Administered 2011-08-13: 5 mg via ORAL
  Filled 2011-08-13 (×2): qty 1

## 2011-08-13 MED ORDER — ACETAMINOPHEN 325 MG PO TABS
650.0000 mg | ORAL_TABLET | Freq: Once | ORAL | Status: AC
  Administered 2011-08-13: 650 mg via ORAL
  Filled 2011-08-13: qty 2

## 2011-08-13 MED ORDER — PHYTONADIONE 5 MG PO TABS: 10.0000 mg | ORAL_TABLET | Freq: Once | ORAL | Status: DC

## 2011-08-13 MED ORDER — CYCLOSPORINE 0.05 % OP EMUL
1.0000 [drp] | Freq: Two times a day (BID) | OPHTHALMIC | Status: DC
  Administered 2011-08-13 – 2011-08-14 (×3): 1 [drp] via OPHTHALMIC
  Administered 2011-08-15 – 2011-08-16 (×3): via OPHTHALMIC
  Filled 2011-08-13 (×7): qty 1

## 2011-08-13 MED ORDER — ACETAMINOPHEN 325 MG PO TABS: 650.0000 mg | ORAL_TABLET | Freq: Four times a day (QID) | ORAL | Status: DC | PRN

## 2011-08-13 MED ORDER — METHYLPHENIDATE HCL 5 MG PO TABS
10.0000 mg | ORAL_TABLET | Freq: Two times a day (BID) | ORAL | Status: DC
  Administered 2011-08-14 – 2011-08-16 (×6): 10 mg via ORAL
  Filled 2011-08-13 (×6): qty 2

## 2011-08-13 NOTE — Progress Notes (Signed)
ANTIBIOTIC CONSULT NOTE - INITIAL  Pharmacy Consult for cefepime Indication:  UTI/? Foot infection  Allergies  Allergen Reactions  . Ace Inhibitors Shortness Of Breath  . Vancomycin Other (See Comments)    Unknown     Vital Signs: Temp: 97.6 F (36.4 C) (03/04 1344) Temp src: Oral (03/04 1344) BP: 102/66 mmHg (03/04 1500) Pulse Rate: 79  (03/04 1500) I Labs:  Basename 08/13/11 1445  WBC 7.2  HGB 11.6*  PLT 330  LABCREA --  CREATININE 1.31*    Microbiology: No results found for this or any previous visit (from the past 720 hour(s)).  Medical History: Past Medical History  Diagnosis Date  . HTN (hypertension)   . HLD (hyperlipidemia)   . History of methicillin resistant staphylococcus aureus (MRSA)   . Sleep apnea   . CVA (cerebral infarction)   . Anemia   . Tachycardia   . Obesity   . Renal failure     due to vanc toxicity  . Osteomyelitis   . Lower back pain     Chronic  . Idiopathic peripheral neuropathy     NOS  . DJD (degenerative joint disease)   . Headache   . GERD (gastroesophageal reflux disease)   . DM type 2 (diabetes mellitus, type 2)   . Depression   . Anxiety   . Hypokalemia     resolved  . Cerebral infarct     left parietal and bilateral  . Esophagitis     distal  . Fluid overload     compensated  . Urinary retention   . H/O: pneumonia   . History of bacteremia   . Surgery, other elective     of the ear  . Stroke     Hx of left frontoparietal stroke in 12/11. History of right brain stroke.  . Cardiomyopathy     Echocardiogram 04/23/11: Mild LVH, EF 30-35%, mild MR, mild LAE, mild-moderate RVE.  TEE 04/25/11: Moderate LVH, severe biventricular dysfunction, EF 20-25%, moderate to severe MR, moderate TR, biatrial enlargement, no PFO, negative bubble study    Assessment: 51 yo female here with INR > 10 and noted with GIB. Patient is from [facility] and was on an antibiotic PTA for UTI and foot infection not to start  cefepime.   Plan:  -Will give cefepime 2gm q12h and follow renal function and cultures  Dareen Piano 08/13/2011,5:45 PM

## 2011-08-13 NOTE — ED Provider Notes (Signed)
History     CSN: [id number]  Arrival date & time 08/13/11  1331   First MD Initiated Contact with Patient 08/13/11 1404      Chief Complaint  Patient presents with  . Abnormal Lab    INR >10    (Consider location/radiation/quality/duration/timing/severity/associated sxs/prior treatment) HPI  Pt presents to the ER from [facility] with complains of an abnormal lab value. The patients laboratory results show her PT/ INR to be >90 and >10. The patient currently is not having any symptoms and states that she feels fine. She is in the ED with her sister. Pt has diabetes and is in the [facility] for wound care. Patient is s/p amputation and is scheduled for a left foot amputation coming up soon. She is on abx for a UTI and on abx for her foot. VSS in ED, pt in NAD, alert and oriented at this time.  Past Medical History  Diagnosis Date  . HTN (hypertension)   . HLD (hyperlipidemia)   . History of methicillin resistant staphylococcus aureus (MRSA)   . Sleep apnea   . CVA (cerebral infarction)   . Anemia   . Tachycardia   . Obesity   . Renal failure     due to vanc toxicity  . Osteomyelitis   . Lower back pain     Chronic  . Idiopathic peripheral neuropathy     NOS  . DJD (degenerative joint disease)   . Headache   . GERD (gastroesophageal reflux disease)   . DM type 2 (diabetes mellitus, type 2)   . Depression   . Anxiety   . Hypokalemia     resolved  . Cerebral infarct     left parietal and bilateral  . Esophagitis     distal  . Fluid overload     compensated  . Urinary retention   . H/O: pneumonia   . History of bacteremia   . Surgery, other elective     of the ear  . Stroke     Hx of left frontoparietal stroke in 12/11. History of right brain stroke.  . Cardiomyopathy     Echocardiogram 04/23/11: Mild LVH, EF 30-35%, mild MR, mild LAE, mild-moderate RVE.  TEE 04/25/11: Moderate LVH, severe biventricular dysfunction, EF 20-25%, moderate to severe  MR, moderate TR, biatrial enlargement, no PFO, negative bubble study    Past Surgical History  Procedure Date  . Tympanoplasty   . Toe amputation     left 2nd toe  . Dilation and curettage of uterus     history  . Tee without cardioversion 04/25/2011    Procedure: TRANSESOPHAGEAL ECHOCARDIOGRAM (TEE);  Surgeon: Jolaine Artist, MD;  Location: [facility] ENDOSCOPY;  Service: Cardiovascular;  Laterality: N/A;    Family History  Problem Relation Age of Onset  . Coronary artery disease    . Diabetes    . Cancer Mother   . Diabetes Mother   . Heart disease Father   . Hyperlipidemia Father   . Hypertension Father   . Stroke Father   . Heart disease Brother     History  Substance Use Topics  . Smoking status: Never Smoker   . Smokeless tobacco: Never Used  . Alcohol Use: Yes     rare on holidays    OB History    Grav Para Term Preterm Abortions TAB SAB Ect Mult Living                  Review  of Systems  All other systems reviewed and are negative.    Allergies  Ace inhibitors and Vancomycin  Home Medications   Current Outpatient Rx  Name Route Sig Dispense Refill  . ACETAMINOPHEN ER 650 MG PO TBCR Oral Take 650 mg by mouth every 4 (four) hours as needed. For chronic pain.    Marland Kitchen MAALOX PLUS PO Oral Take 30 mLs by mouth 4 (four) times daily as needed. For indigestion.    Marland Kitchen BIOTIN PO Oral Take 15 mLs by mouth 2 (two) times daily. Biotin Suspension. Swish and spit    . CIPROFLOXACIN HCL 500 MG PO TABS Oral Take 1 tablet (500 mg total) by mouth 2 (two) times daily. 14 tablet 0  . CITALOPRAM HYDROBROMIDE 10 MG PO TABS Oral Take 10 mg by mouth at bedtime.     . COLLAGENASE 250 UNIT/GM EX OINT Topical Apply 1 application topically daily. Apply to left great toe and to left foot.    . CYCLOSPORINE 0.05 % OP EMUL Both Eyes Place 1 drop into both eyes 2 (two) times daily.     Marland Kitchen DOXYCYCLINE HYCLATE 100 MG PO TABS Oral Take 100 mg by mouth 2 (two) times daily.    Marland Kitchen FERROUS SULFATE  325 (65 FE) MG PO TABS Oral Take 325 mg by mouth 2 (two) times daily.     . FUROSEMIDE 20 MG PO TABS Oral Take 20 mg by mouth daily.      Marland Kitchen GABAPENTIN 300 MG PO CAPS Oral Take 600 mg by mouth 4 (four) times daily.    Marland Kitchen GLIMEPIRIDE 2 MG PO TABS Oral Take 2 mg by mouth daily before breakfast.      . HYDROCODONE-ACETAMINOPHEN 5-500 MG PO TABS Oral Take 1-2 tablets by mouth every 4 (four) hours. For pain    . LOSARTAN POTASSIUM 100 MG PO TABS Oral Take 100 mg by mouth daily.      Marland Kitchen METHOCARBAMOL 500 MG PO TABS Oral Take 500 mg by mouth every 6 (six) hours as needed. For muscle spasms.    . METHYLPHENIDATE HCL 10 MG PO TABS Oral Take 10 mg by mouth 2 (two) times daily.      Marland Kitchen METOPROLOL TARTRATE 100 MG PO TABS Oral Take 100 mg by mouth 2 (two) times daily.    Creed Copper M PLUS PO TABS Oral Take 2 tablets by mouth daily.    Marland Kitchen ONDANSETRON HCL 4 MG PO TABS Oral Take 1 tablet (4 mg total) by mouth every 6 (six) hours. 12 tablet 0  . PROMETHAZINE HCL 25 MG/ML IJ SOLN Intramuscular Inject 25 mg into the muscle every 6 (six) hours as needed. For nausea/vomiting    . SIMVASTATIN 40 MG PO TABS Oral Take 40 mg by mouth at bedtime.    . WARFARIN SODIUM 5 MG PO TABS Oral Take 5 mg by mouth every evening.       BP 102/66  Pulse 79  Temp(Src) 97.6 F (36.4 C) (Oral)  Resp 18  SpO2 97%  Physical Exam  Nursing note and vitals reviewed. Constitutional: She appears well-developed and well-nourished. No distress.  HENT:  Head: Normocephalic and atraumatic.  Eyes: Pupils are equal, round, and reactive to light.  Neck: Normal range of motion. Neck supple.  Cardiovascular: Normal rate and regular rhythm.   Pulmonary/Chest: Effort normal.  Abdominal: Soft.  Genitourinary: Rectal exam shows no external hemorrhoid and no fissure. Guaiac positive stool.       pts stool is dark and  mucosy  Musculoskeletal:       Feet:  Neurological: She is alert.  Skin: Skin is warm and dry.    ED Course  Procedures  (including critical care time)  Labs Reviewed  PROTIME-INR - Abnormal; Notable for the following:    Prothrombin Time 88.7 (*)    INR 11.24 (*)    All other components within normal limits  CBC - Abnormal; Notable for the following:    Hemoglobin 11.6 (*)    RDW 16.5 (*)    All other components within normal limits  BASIC METABOLIC PANEL - Abnormal; Notable for the following:    Potassium 3.4 (*)    Glucose, Bld 64 (*)    BUN 33 (*)    Creatinine, Ser 1.31 (*)    GFR calc non Af Amer 47 (*)    GFR calc Af Amer 55 (*)    All other components within normal limits  OCCULT BLOOD, POC DEVICE  URINALYSIS, ROUTINE W REFLEX MICROSCOPIC  TYPE AND SCREEN  PREPARE FRESH FROZEN PLASMA   No results found.   1. Coumadin toxicity       MDM  Dr. Winfred Leeds acquired care of patient. Ordered 4 units of FFP, stool guaiac positive, Patient having coumadin toxicity. Unclear why she is on coumadin. Pts hemaglobin is stable and patient is in no acute distress at this time and vital signs are stable.  Pt admitted byt Dr. Winfred Leeds to Step Down unit team 8.         Linus Mako, [place] 08/13/11 1635

## 2011-08-13 NOTE — ED Notes (Signed)
Placed a 14 french foley cath in patient yellow cloudy urine return

## 2011-08-13 NOTE — H&P (Signed)
PATIENT DETAILS Name: [name] Age: 50 y.o. Sex: female Date of Birth: 1961/04/21 Admit Date: 08/13/2011 [id number], Gwynneth Munson, MD, MD   CHIEF COMPLAINT:  Sent from skilled nursing facility for supratherapeutic INR   HPI: 51 year old African American female with a past medical history of cardiomyopathy, CVA, TEE done on November of last year was positive for smoke as a result the patient was placed on chronic Coumadin therapy, ischemic left foot with ulcers was sent from [facility] living for supratherapeutic INR. Apparently this patient was in the ED on 08/08/2010 for vomiting, she was diagnosed with UTI and placed on Cipro and discharged back to the facility. She was just prior to that placed on doxycycline for presumed cellulitis of her left foot area, vomiting resolved but over the past 2 days she started having dark stools. He INR done at the facility today showed her to be more than 10, she was then referred to the [facility] Emergency department for further evaluation and treatment. During evaluation here, she was noted to have black stools that were guaiac positive. Patient claims that she is at around 3-4 episodes of black stools today in a similar number of episodes yesterday. Her vomiting has now resolved. She denies any abdominal pain. She denies any headache. Her speech is mostly clear although she does appear to have a chronic left facial droop. She does have some residual weakness on the left upper and left lower extremity from a prior CVA. She is now being referred to the hospitalist service for admission and further evaluation and management. Also a Foley catheter was placed in the emergency room, during my evaluation I would just a cloudy and purulent drainage in the Foley bag.   ALLERGIES:   Allergies  Allergen Reactions  . Ace Inhibitors Shortness Of Breath  . Vancomycin Other (See Comments)    Unknown     PAST MEDICAL HISTORY: Past Medical History  Diagnosis Date  .  HTN (hypertension)   . HLD (hyperlipidemia)   . History of methicillin resistant staphylococcus aureus (MRSA)   . Sleep apnea   . CVA (cerebral infarction)   . Anemia   . Tachycardia   . Obesity   . Renal failure     due to vanc toxicity  . Osteomyelitis   . Lower back pain     Chronic  . Idiopathic peripheral neuropathy     NOS  . DJD (degenerative joint disease)   . Headache   . GERD (gastroesophageal reflux disease)   . DM type 2 (diabetes mellitus, type 2)   . Depression   . Anxiety   . Hypokalemia     resolved  . Cerebral infarct     left parietal and bilateral  . Esophagitis     distal  . Fluid overload     compensated  . Urinary retention   . H/O: pneumonia   . History of bacteremia   . Surgery, other elective     of the ear  . Stroke     Hx of left frontoparietal stroke in 12/11. History of right brain stroke.  . Cardiomyopathy     Echocardiogram 04/23/11: Mild LVH, EF 30-35%, mild MR, mild LAE, mild-moderate RVE.  TEE 04/25/11: Moderate LVH, severe biventricular dysfunction, EF 20-25%, moderate to severe MR, moderate TR, biatrial enlargement, no PFO, negative bubble study    PAST SURGICAL HISTORY: Past Surgical History  Procedure Date  . Tympanoplasty   . Toe amputation     left 2nd  toe  . Dilation and curettage of uterus     history  . Tee without cardioversion 04/25/2011    Procedure: TRANSESOPHAGEAL ECHOCARDIOGRAM (TEE);  Surgeon: Jolaine Artist, MD;  Location: [facility] ENDOSCOPY;  Service: Cardiovascular;  Laterality: N/A;    MEDICATIONS AT HOME: Prior to Admission medications   Medication Sig Start Date End Date Taking? Authorizing Provider  acetaminophen (TYLENOL) 650 MG CR tablet Take 650 mg by mouth every 4 (four) hours as needed. For chronic pain.   Yes Historical Provider, MD  Alum & Mag Hydroxide-Simeth (MAALOX PLUS PO) Take 30 mLs by mouth 4 (four) times daily as needed. For indigestion.   Yes Historical Provider, MD  BIOTIN PO Take 15 mLs  by mouth 2 (two) times daily. Biotin Suspension. Swish and spit   Yes Historical Provider, MD  ciprofloxacin (CIPRO) 500 MG tablet Take 1 tablet (500 mg total) by mouth 2 (two) times daily. 08/09/11 08/19/11 Yes Saddie Benders. Ghim, MD  citalopram (CELEXA) 10 MG tablet Take 10 mg by mouth at bedtime.    Yes Historical Provider, MD  collagenase (SANTYL) ointment Apply 1 application topically daily. Apply to left great toe and to left foot.   Yes Historical Provider, MD  cycloSPORINE (RESTASIS) 0.05 % ophthalmic emulsion Place 1 drop into both eyes 2 (two) times daily.    Yes Historical Provider, MD  doxycycline (VIBRA-TABS) 100 MG tablet Take 100 mg by mouth 2 (two) times daily. 08/09/11 09/06/11 Yes Historical Provider, MD  ferrous sulfate 325 (65 FE) MG tablet Take 325 mg by mouth 2 (two) times daily.    Yes Historical Provider, MD  furosemide (LASIX) 20 MG tablet Take 20 mg by mouth daily.     Yes Historical Provider, MD  gabapentin (NEURONTIN) 300 MG capsule Take 600 mg by mouth 4 (four) times daily.   Yes Historical Provider, MD  glimepiride (AMARYL) 2 MG tablet Take 2 mg by mouth daily before breakfast.     Yes Historical Provider, MD  HYDROcodone-acetaminophen (VICODIN) 5-500 MG per tablet Take 1-2 tablets by mouth every 4 (four) hours. For pain   Yes Historical Provider, MD  losartan (COZAAR) 100 MG tablet Take 100 mg by mouth daily.     Yes Historical Provider, MD  methocarbamol (ROBAXIN) 500 MG tablet Take 500 mg by mouth every 6 (six) hours as needed. For muscle spasms.   Yes Historical Provider, MD  methylphenidate (RITALIN) 10 MG tablet Take 10 mg by mouth 2 (two) times daily.     Yes Historical Provider, MD  metoprolol (LOPRESSOR) 100 MG tablet Take 100 mg by mouth 2 (two) times daily.   Yes Liliane Shi, PA  Multiple Vitamins-Minerals (MULTIVITAMINS THER. W/MINERALS) TABS Take 2 tablets by mouth daily.   Yes Historical Provider, MD  ondansetron (ZOFRAN) 4 MG tablet Take 1 tablet (4 mg total)  by mouth every 6 (six) hours. 08/09/11 08/16/11 Yes Saddie Benders. Ghim, MD  promethazine (PHENERGAN) 25 MG/ML injection Inject 25 mg into the muscle every 6 (six) hours as needed. For nausea/vomiting   Yes Historical Provider, MD  simvastatin (ZOCOR) 40 MG tablet Take 40 mg by mouth at bedtime.   Yes Historical Provider, MD  warfarin (COUMADIN) 5 MG tablet Take 5 mg by mouth every evening.    Yes Historical Provider, MD    FAMILY HISTORY: Family History  Problem Relation Age of Onset  . Coronary artery disease    . Diabetes    . Cancer Mother   . Diabetes  Mother   . Heart disease Father   . Hyperlipidemia Father   . Hypertension Father   . Stroke Father   . Heart disease Brother     SOCIAL HISTORY:  reports that she has never smoked. She has never used smokeless tobacco. She reports that she drinks alcohol. She reports that she does not use illicit drugs.  REVIEW OF SYSTEMS:  Constitutional:   No  weight loss, night sweats,  Fevers, chills, fatigue.  HEENT:    No headaches, Difficulty swallowing,Tooth/dental problems,Sore throat,  No sneezing, itching, ear ache, nasal congestion, post nasal drip,   Cardio-vascular: No chest pain,  Orthopnea, PND, swelling in lower extremities, anasarca, dizziness, palpitations  GI:  No heartburn, indigestion, abdominal pain, nausea, change in  bowel habits, loss of appetite  Resp: No shortness of breath with exertion or at rest.  No excess mucus, no productive cough, No non-productive cough,  No coughing up of blood.No change in color of mucus.No wheezing.No chest wall deformity  Skin:  no rash or lesions.  GU:  no dysuria, change in color of urine, no urgency or frequency.  No flank pain.  Musculoskeletal: No joint pain or swelling.  No decreased range of motion.  No back pain.  Psych: No change in mood or affect. No depression.  No memory loss.   PHYSICAL EXAM: Blood pressure 102/66, pulse 79, temperature 97.6 F (36.4 C),  temperature source Oral, resp. rate 18, SpO2 97.00%.  General appearance :Awake, alert, not in any distress. Speech Clear. Not toxic Looking HEENT: Atraumatic and Normocephalic, pupils equally reactive to light and accomodation Neck: supple, no JVD. No cervical lymphadenopathy.  Chest:Good air entry bilaterally, no added sounds  CVS: S1 S2 regular, no murmurs.  Abdomen: Bowel sounds present, Non tender and not distended with no gaurding, rigidity or rebound. Obese abdomen. Extremities: B/L Lower Ext shows no edema, left leg shows numerous ischemic ulcers on her toes, there is gangrenous area on her great toe and on the lateral/plantar aspect of the left foot. The family at bedside these are all chronic. Neurology: Awake alert, and oriented X 3, left upper extremity is around 2-3/5, left lower extremity is around 4/5  Skin:No Rash Wounds:N/A  LABS ON ADMISSION:   Basename 08/13/11 1445  NA 139  K 3.4*  CL 107  CO2 22  GLUCOSE 64*  BUN 33*  CREATININE 1.31*  CALCIUM 8.4  MG --  PHOS --   No results found for this basename: AST:2,ALT:2,ALKPHOS:2,BILITOT:2,PROT:2,ALBUMIN:2 in the last 72 hours No results found for this basename: LIPASE:2,AMYLASE:2 in the last 72 hours  Basename 08/13/11 1445  WBC 7.2  NEUTROABS --  HGB 11.6*  HCT 36.9  MCV 84.4  PLT 330   No results found for this basename: CKTOTAL:3,CKMB:3,CKMBINDEX:3,TROPONINI:3 in the last 72 hours No results found for this basename: DDIMER:2 in the last 72 hours No components found with this basename: POCBNP:3   RADIOLOGIC STUDIES ON ADMISSION: No results found.  ASSESSMENT AND PLAN: Present on Admission:  .Coumadin toxicity -Possibly secondary to interactions with antibiotics -ciprofloxacin and doxycycline  -Given the fact that she has dropped 4 g of hemoglobin and because of the fact that she has black tarry stools, we will transfuse her 4 units of FFP and give her 5 mg of vitamin K.  -We will recheck her INR  after she has been given the above noted therapy, follow clinical course and if needed then transfuse her more FFP's.   .GI bleed -Patient's hemoglobin on  the 28th of last month was around 15.4, it has decreased to 11.6 today, as noted above she does have black tarry stools that is guaiac positive, for now we will place her on PPI, give her FFP is and keep monitoring her hemoglobin and hematocrit. If her hemoglobin and hematocrit were too precipitously drop then she will be transfused PRBC. Because of the fact that she will still need long-term Coumadin therapy, she probably would need a GI consultation for possible evaluation of EGD or colonoscopy. I have spoken with Dr. Henrene Pastor from [facility] gastroenterology, he will consult tomorrow morning.   .Anemia -Possibly secondary to blood loss -We'll monitor H&H, and transfuse when necessary.  .Urinary tract infection with pyuria -Already on ciprofloxacin since February 28, still has visible pyuria, we'll get urine culture and place on cefepime -Although she has pyuria she does not appear to have a fever or leukocytosis. She does not appear to be toxic as well   .ARF (acute renal failure) -This is a mild -Likely prerenal  -Hydrate for now. Recheck electrolytes in the morning   .HYPERLIPIDEMIA -Continue with statin   .HYPERTENSION -Blood pressure stable but soft, given the potential for her to have further worsening of the GI bleed, we will hold all her antihypertensive medications. These can be resumed as her clinical course improves.   Marland KitchenDIABETES MELLITUS, TYPE II -Hold oral hypoglycemic agents, placed on sliding scale insulin for now  .CVA -History of prior CVAs, presumed to be embolic in the setting of cardiomyopathy and smoke seen on TEE, INR currently supratherapeutic, Coumadin will be placed on hold. Will avoid antiplatelets as well given possibility of GI bleed  .NEUROPATHY, IDIOPATHIC PERIPHERAL NOS -Continue with Neurontin   Further  plan will depend as patient's clinical course evolves and further radiologic and laboratory data become available. Patient will be monitored closely.  DVT Prophylaxis: -Not needed as INR subtherapeutic   Code Status:  full code  Total time spent for admission equals 45 minutes.  Oren Binet 08/13/2011, 5:44 PM

## 2011-08-13 NOTE — ED Provider Notes (Addendum)
Patient asymptomatic noted to have INR of greater than 10 as outpatient earlier today. On exam alert nontoxic not acutely ill-appearing. Noted to have heme positive stool on rectal exam, which was dark possibly melanotic  Fresh frozen plasma ordered spoke with hospitalist physician who will arrange for a mission to step down unit Diagnosis #1 Coumadin toxicity #2 GI bleed Orlie Dakin, MD 08/13/11 [place], MD 08/13/11 [id number]

## 2011-08-13 NOTE — ED Provider Notes (Signed)
Medical screening examination/treatment/procedure(s) were conducted as a shared visit with non-physician practitioner(s) and myself.  I personally evaluated the patient during the encounter  Orlie Dakin, MD 08/13/11 [phone]

## 2011-08-14 ENCOUNTER — Encounter (HOSPITAL_COMMUNITY): Payer: Self-pay | Admitting: *Deleted

## 2011-08-14 ENCOUNTER — Encounter (HOSPITAL_COMMUNITY)
Admission: EM | Disposition: A | Discharge status: Skilled Nursing Facility | Payer: Self-pay | Source: Ambulatory Visit | Attending: Family Medicine

## 2011-08-14 ENCOUNTER — Encounter (HOSPITAL_BASED_OUTPATIENT_CLINIC_OR_DEPARTMENT_OTHER): Payer: Medicaid Other

## 2011-08-14 DIAGNOSIS — I513 Intracardiac thrombosis, not elsewhere classified: Secondary | ICD-10-CM

## 2011-08-14 DIAGNOSIS — T50904A Poisoning by unspecified drugs, medicaments and biological substances, undetermined, initial encounter: Secondary | ICD-10-CM

## 2011-08-14 DIAGNOSIS — T45511A Poisoning by anticoagulants, accidental (unintentional), initial encounter: Secondary | ICD-10-CM

## 2011-08-14 DIAGNOSIS — K922 Gastrointestinal hemorrhage, unspecified: Secondary | ICD-10-CM

## 2011-08-14 DIAGNOSIS — D62 Acute posthemorrhagic anemia: Secondary | ICD-10-CM

## 2011-08-14 DIAGNOSIS — T50901A Poisoning by unspecified drugs, medicaments and biological substances, accidental (unintentional), initial encounter: Secondary | ICD-10-CM

## 2011-08-14 HISTORY — PX: ESOPHAGOGASTRODUODENOSCOPY: SHX5428

## 2011-08-14 LAB — COMPREHENSIVE METABOLIC PANEL
Alkaline Phosphatase: 67 U/L (ref 39–117)
BUN: 30 mg/dL — ABNORMAL HIGH (ref 6–23)
Chloride: 110 mEq/L (ref 96–112)
Creatinine, Ser: 1.13 mg/dL — ABNORMAL HIGH (ref 0.50–1.10)
GFR calc Af Amer: 66 mL/min — ABNORMAL LOW (ref >90)
Glucose, Bld: 65 mg/dL — ABNORMAL LOW (ref 70–99)
Potassium: 2.9 mEq/L — ABNORMAL LOW (ref 3.5–5.1)
Total Bilirubin: 0.2 mg/dL — ABNORMAL LOW (ref 0.3–1.2)
Total Protein: 5.3 g/dL — ABNORMAL LOW (ref 6.0–8.3)

## 2011-08-14 LAB — CBC
HCT: 26.7 % — ABNORMAL LOW (ref 36.0–46.0)
Hemoglobin: 8.6 g/dL — ABNORMAL LOW (ref 12.0–15.0)
MCH: 27 pg (ref 26.0–34.0)
MCHC: 32.2 g/dL (ref 30.0–36.0)
MCHC: 32.3 g/dL (ref 30.0–36.0)
MCV: 84.2 fL (ref 78.0–100.0)
MCV: 83.6 fL (ref 78.0–100.0)
Platelets: 262 10*3/uL (ref 150–400)
RDW: 16.3 % — ABNORMAL HIGH (ref 11.5–15.5)

## 2011-08-14 LAB — PROTIME-INR
INR: 2.06 — ABNORMAL HIGH (ref 0.00–1.49)
Prothrombin Time: 23.6 seconds — ABNORMAL HIGH (ref 11.6–15.2)
Prothrombin Time: 19.4 seconds — ABNORMAL HIGH (ref 11.6–15.2)

## 2011-08-14 LAB — GLUCOSE, CAPILLARY
Glucose-Capillary: 69 mg/dL — ABNORMAL LOW (ref 70–99)
Glucose-Capillary: 151 mg/dL — ABNORMAL HIGH (ref 70–99)

## 2011-08-14 SURGERY — EGD (ESOPHAGOGASTRODUODENOSCOPY)
Anesthesia: Moderate Sedation

## 2011-08-14 MED ORDER — POTASSIUM CHLORIDE 10 MEQ/100ML IV SOLN
10.0000 meq | INTRAVENOUS | Status: AC
  Administered 2011-08-14 (×4): 10 meq via INTRAVENOUS
  Filled 2011-08-14 (×4): qty 100

## 2011-08-14 MED ORDER — INSULIN ASPART 100 UNIT/ML ~~LOC~~ SOLN
0.0000 [IU] | SUBCUTANEOUS | Status: DC
  Administered 2011-08-14: 2 [IU] via SUBCUTANEOUS
  Filled 2011-08-14: qty 3

## 2011-08-14 MED ORDER — FUROSEMIDE 10 MG/ML IJ SOLN
40.0000 mg | Freq: Once | INTRAMUSCULAR | Status: AC
  Administered 2011-08-14: 40 mg via INTRAVENOUS
  Filled 2011-08-14: qty 4

## 2011-08-14 MED ORDER — POTASSIUM CHLORIDE CRYS ER 20 MEQ PO TBCR
40.0000 meq | EXTENDED_RELEASE_TABLET | Freq: Two times a day (BID) | ORAL | Status: DC
  Administered 2011-08-14 – 2011-08-16 (×5): 40 meq via ORAL
  Filled 2011-08-14 (×6): qty 2

## 2011-08-14 MED ORDER — BUTAMBEN-TETRACAINE-BENZOCAINE 2-2-14 % EX AERO
INHALATION_SPRAY | CUTANEOUS | Status: DC | PRN
  Administered 2011-08-14: 2 via TOPICAL

## 2011-08-14 MED ORDER — MIDAZOLAM HCL 10 MG/2ML IJ SOLN
INTRAMUSCULAR | Status: AC
  Filled 2011-08-14: qty 4

## 2011-08-14 MED ORDER — FENTANYL CITRATE 0.05 MG/ML IJ SOLN
INTRAMUSCULAR | Status: AC
  Filled 2011-08-14: qty 4

## 2011-08-14 MED ORDER — SODIUM CHLORIDE 0.9 % IV SOLN
INTRAVENOUS | Status: DC
Start: 2011-08-14 — End: 2011-08-14
  Administered 2011-08-14: 500 mL via INTRAVENOUS

## 2011-08-14 MED ORDER — FENTANYL NICU IV SYRINGE 50 MCG/ML
INJECTION | INTRAMUSCULAR | Status: DC | PRN
  Administered 2011-08-14 (×2): 25 ug via INTRAVENOUS

## 2011-08-14 MED ORDER — MIDAZOLAM HCL 10 MG/2ML IJ SOLN
INTRAMUSCULAR | Status: DC | PRN
  Administered 2011-08-14: 1 mg via INTRAVENOUS
  Administered 2011-08-14: 2 mg via INTRAVENOUS
  Administered 2011-08-14: 1 mg via INTRAVENOUS
  Administered 2011-08-14: 2 mg via INTRAVENOUS

## 2011-08-14 NOTE — Progress Notes (Signed)
Subjective: 51 y/o female who recently had a CVA (11/12) and was placed on Coumadin for suspected cardiac thrombus noted on TEE. She also has severe PVD with ulcers on left foot, HTN, DM, systolic CHF w/ EF of [id number].  She is sent for black stools and supratheraputic INR. She currently has had another BM with maroon stool. She is in no discomfort and has no complaints but wants to eat.   Objective: Blood pressure 102/51, pulse 95, temperature 97.7 F (36.5 C), temperature source Oral, resp. rate 14, height 5\' 7"  (1.702 m), weight 108.1 kg (238 lb 5.1 oz), SpO2 98.00%. Weight change:   Intake/Output Summary (Last 24 hours) at 08/14/11 0846 Last data filed at 08/14/11 0500  Gross per 24 hour  Intake   1048 ml  Output   2000 ml  Net   -952 ml    Physical Exam: General appearance :Awake, alert, not in any distress. Speech Clear. HEENT: Atraumatic and Normocephalic, pupils equally reactive to light and accomodation  Neck: supple, no JVD. No cervical lymphadenopathy.  Chest:Good air entry bilaterally, no added sounds  CVS: S1 S2 regular, no murmurs.  Abdomen: Bowel sounds present, Non tender and not distended with no gaurding, rigidity or rebound. Obese abdomen.  Extremities: B/L Lower Ext shows no edema, left leg shows numerous ischemic ulcers on her toes and on her lateral malleolus. There is gangrenous/necrotic area on her great toe and on the lateral/plantar aspect of the left foot with bone exposed.   Neurology: Awake alert, and oriented X 3, left upper extremity 2/5, left lower extremity is 4/5. Right side 5/5.    Lab Results:  [facility] 08/14/11 0623 08/13/11 1445  NA 143 139  K 2.9* 3.4*  CL 110 107  CO2 27 22  GLUCOSE 65* 64*  BUN 30* 33*  CREATININE 1.13* 1.31*  CALCIUM 8.1* 8.4  MG -- --  PHOS -- --    Basename 08/14/11 0623  AST 35  ALT 29  ALKPHOS 67  BILITOT 0.2*  PROT 5.3*  ALBUMIN 2.3*   No results found for this basename: LIPASE:2,AMYLASE:2 in the last 72  hours  Basename 08/14/11 0623 08/13/11 2107 08/13/11 1445  WBC 4.1 -- 7.2  NEUTROABS -- -- --  HGB 8.6* 11.7* --  HCT 26.7* 35.8* --  MCV 84.2 -- 84.4  PLT 258 -- 330   No results found for this basename: CKTOTAL:3,CKMB:3,CKMBINDEX:3,TROPONINI:3 in the last 72 hours No components found with this basename: POCBNP:3 No results found for this basename: DDIMER:2 in the last 72 hours No results found for this basename: HGBA1C:2 in the last 72 hours No results found for this basename: CHOL:2,HDL:2,LDLCALC:2,TRIG:2,CHOLHDL:2,LDLDIRECT:2 in the last 72 hours No results found for this basename: TSH,T4TOTAL,FREET3,T3FREE,THYROIDAB in the last 72 hours No results found for this basename: VITAMINB12:2,FOLATE:2,FERRITIN:2,TIBC:2,IRON:2,RETICCTPCT:2 in the last 72 hours  Micro Results: Recent Results (from the past 240 hour(s))  MRSA PCR SCREENING     Status: Normal   Collection Time   08/13/11  8:14 PM      Component Value Range Status Comment   MRSA by PCR NEGATIVE  NEGATIVE  Final     Studies/Results: No results found.  Medications: Scheduled Meds:    . acetaminophen  650 mg Oral Once  . ceFEPime (MAXIPIME) IV  2 g Intravenous To Major  . ceFEPime (MAXIPIME) IV  2 g Intravenous Q12H  . citalopram  10 mg Oral QHS  . collagenase  1 application Topical Daily  . cycloSPORINE  1 drop  Both Eyes BID  . diphenhydrAMINE  25 mg Intravenous Once  . ferrous sulfate  325 mg Oral BID  . furosemide  20 mg Intravenous Once  . furosemide  40 mg Intravenous Once  . gabapentin  600 mg Oral QID  . insulin aspart  0-9 Units Subcutaneous TID WC  . methylphenidate  10 mg Oral BID AC  . mulitivitamin with minerals  1 tablet Oral BID  . pantoprazole (PROTONIX) IV  40 mg Intravenous Q12H  . phytonadione  5 mg Oral Once  . potassium chloride  10 mEq Intravenous Q1 Hr x 4  . potassium chloride  40 mEq Oral BID  . simvastatin  40 mg Oral QHS  . DISCONTD: multivitamins ther. w/minerals  2 tablet Oral Daily   . DISCONTD: phytonadione  10 mg Oral Once   Continuous Infusions:    . sodium chloride 1,000 mL (08/14/11 0646)   PRN Meds:.acetaminophen, acetaminophen, albuterol, ALPRAZolam, HYDROcodone-acetaminophen, methocarbamol, ondansetron (ZOFRAN) IV, ondansetron  Assessment/Plan: Principal Problem:  *GI bleed Continues to bleed. Will cont to reverse INR and ask for GI eval. NPO for now. BID Protonix.  No h/o of gastric ulcers, never had colonoscopy.  Anemia   Anemia due to acute blood loss - Transfuse 2 units PRBC now. Keep close to 10 due to severe PVD. Keep 2 units of blood on hold until we are certain bleeding has stopped.  - although she had a hemoglobin of 15 on arrival, it appears that her baseline may be 10 which is what it was in 11/12   Coumadin toxicity INR reversed down to 2. Will give 2 more units FFP in attempt to stop bleeding and also to prepare her for endoscopy.  She received 5 mg of Vit K last night.  I have made her NPO and spoken with Gerre Couch with [facility]  Urinary tract infection with pyuria On Cefepime. Culture pending.    CVA Suspected by Neuro and Cards to be embolic in nature. Neuro recommended Coumadin.  Initial CVA was 11/11 resulting in left hemiparesis, slurred speech and right facial droop.  Repeat CVA in 11/12 (R MCA)   PVD (peripheral vascular disease) with decubitus ulcers on left foot.  Follow with [facility] [place] as outpt.  Due to CVA affecting her left leg, she has developed pressure ulcers which are non-healing.  Per  Vascular office note in 08/03/11, she is not a candidate for revascularization and amputation is advised if infection develops or if she develops severe claudication symptoms.   Wound care is following.    DIABETES MELLITUS, TYPE II Sliding scale insulin while NPO. Hold Amaryl  CKD 3   Chronic systolic CHF  Lasix in between units of blood Follow volume status carefully while attempting to  replace blood   HYPERTENSION BP currently low, hold off on antihypertensive.        LOS: 1 day   [facility] [phone] 08/14/2011, 8:46 AM

## 2011-08-14 NOTE — Consult Note (Signed)
[facility] Consult: 10:05 AM 08/14/2011   Referring Provider: Dr. Wynelle Cleveland  Primary Care Physician:  Ricke Hey, MD Primary Gastroenterologist:  Dr. Olevia Perches   Reason for Consultation:  Anemia and heme positive stool in the setting of INR greater than 10.  HPI: [name] is a 51 y.o. female.  She has a history of stroke.  Patient was placed on chronic Coumadin therapy in November 2012. She has a history of hematemesis in January 2012. EGD then showed mild esophagitis and a sessile polyp in the duodenal bulb. Pathology from the bulb was benign. She did not have evidence for H. Pylori. At the time of endoscopy Dr. Maurene Capes felt that the hematemesis was a result of vigorous vomiting. Patient did not have Mallory-Weiss tear.  She did not require transfusion with blood products. Patient is a non-insulin-dependent diabetic. She has chronic anemia and takes iron sulfate twice daily.  Her facility meds list does not include any H2 blockers nor any PPI class medications.  Patient was diagnosed on February 28 with a UTI. She was seen in the emergency room that day for vomiting. Since then she's been on Cipro at her skilled nursing facility. Patient had also been on doxycycline for left foot cellulitis. Patient's vomiting resolved but patient developed black stools that tested heme-positive.  INR and at her nursing facility measured greater than 10. Patient was sent to the emergency room for further care. Yesterday evening she was admitted to the hospitalist service  Hemoglobin yesterday afternoon measured 11.6.  The hospitalist was concerned because on the 28th of January her hemoglobin was 15.4. However this was in the setting of previous vomiting and acute urinary tract infection.  If we look further back to November of 2012 we see a range of hemoglobin anywhere from 8.4-10.3. Overnight her hemoglobin has dropped to 8.6. White blood cell count is normal as are  platelets and MCV. BUN is elevated at 32 creatinine also elevated at 1.1. Most importantly, the INR has corrected to 2.0 following a 5 mg oral dose of vitamin K and 4 units of FFP. She has been started on twice daily intravenous Protonix.  Patient has had no further dark stools today. She denies nausea, heartburn, loss of appetite, abdominal pain. Patient has pain in her left foot   Past Medical History  Diagnosis Date  . HTN (hypertension)   . HLD (hyperlipidemia)   . History of methicillin resistant staphylococcus aureus (MRSA)   . Sleep apnea   . CVA (cerebral infarction)   . Anemia   . Tachycardia   . Obesity   . Renal failure     due to vanc toxicity  . Osteomyelitis   . Lower back pain     Chronic  . Idiopathic peripheral neuropathy     NOS  . DJD (degenerative joint disease)   . Headache   . GERD (gastroesophageal reflux disease)   . DM type 2 (diabetes mellitus, type 2)   . Depression   . Anxiety   . Hypokalemia     resolved  . Cerebral infarct     left parietal and bilateral  . Esophagitis     distal  . Fluid overload     compensated  . Urinary retention   . H/O: pneumonia   . History of bacteremia   . Surgery, other elective     of the ear  . Stroke     Hx of left frontoparietal stroke in 12/11. History of right brain stroke.  Marland Kitchen  Cardiomyopathy     Echocardiogram 04/23/11: Mild LVH, EF 30-35%, mild MR, mild LAE, mild-moderate RVE.  TEE 04/25/11: Moderate LVH, severe biventricular dysfunction, EF 20-25%, moderate to severe MR, moderate TR, biatrial enlargement, no PFO, negative bubble study    Past Surgical History  Procedure Date  . Tympanoplasty   . Toe amputation     left 2nd toe  . Dilation and curettage of uterus     history  . Tee without cardioversion 04/25/2011    Procedure: TRANSESOPHAGEAL ECHOCARDIOGRAM (TEE);  Surgeon: Jolaine Artist, MD;  Location: [facility] ENDOSCOPY;  Service: Cardiovascular;  Laterality: N/A;    Prior to Admission  medications   Medication Sig Start Date End Date Taking? Authorizing Provider  acetaminophen (TYLENOL) 650 MG CR tablet Take 650 mg by mouth every 4 (four) hours as needed. For chronic pain.   Yes Historical Provider, MD  Alum & Mag Hydroxide-Simeth (MAALOX PLUS PO) Take 30 mLs by mouth 4 (four) times daily as needed. For indigestion.   Yes Historical Provider, MD  BIOTIN PO Take 15 mLs by mouth 2 (two) times daily. Biotin Suspension. Swish and spit   Yes Historical Provider, MD  ciprofloxacin (CIPRO) 500 MG tablet Take 1 tablet (500 mg total) by mouth 2 (two) times daily. 08/09/11 08/19/11 Yes Saddie Benders. Ghim, MD  citalopram (CELEXA) 10 MG tablet Take 10 mg by mouth at bedtime.    Yes Historical Provider, MD  collagenase (SANTYL) ointment Apply 1 application topically daily. Apply to left great toe and to left foot.   Yes Historical Provider, MD  cycloSPORINE (RESTASIS) 0.05 % ophthalmic emulsion Place 1 drop into both eyes 2 (two) times daily.    Yes Historical Provider, MD  doxycycline (VIBRA-TABS) 100 MG tablet Take 100 mg by mouth 2 (two) times daily. 08/09/11 09/06/11 Yes Historical Provider, MD  ferrous sulfate 325 (65 FE) MG tablet Take 325 mg by mouth 2 (two) times daily.    Yes Historical Provider, MD  furosemide (LASIX) 20 MG tablet Take 20 mg by mouth daily.     Yes Historical Provider, MD  gabapentin (NEURONTIN) 300 MG capsule Take 600 mg by mouth 4 (four) times daily.   Yes Historical Provider, MD  glimepiride (AMARYL) 2 MG tablet Take 2 mg by mouth daily before breakfast.     Yes Historical Provider, MD  HYDROcodone-acetaminophen (VICODIN) 5-500 MG per tablet Take 1-2 tablets by mouth every 4 (four) hours. For pain   Yes Historical Provider, MD  losartan (COZAAR) 100 MG tablet Take 100 mg by mouth daily.     Yes Historical Provider, MD  methocarbamol (ROBAXIN) 500 MG tablet Take 500 mg by mouth every 6 (six) hours as needed. For muscle spasms.   Yes Historical Provider, MD    methylphenidate (RITALIN) 10 MG tablet Take 10 mg by mouth 2 (two) times daily.     Yes Historical Provider, MD  metoprolol (LOPRESSOR) 100 MG tablet Take 100 mg by mouth 2 (two) times daily.   Yes Liliane Shi, PA  Multiple Vitamins-Minerals (MULTIVITAMINS THER. W/MINERALS) TABS Take 2 tablets by mouth daily.   Yes Historical Provider, MD  ondansetron (ZOFRAN) 4 MG tablet Take 1 tablet (4 mg total) by mouth every 6 (six) hours. 08/09/11 08/16/11 Yes Saddie Benders. Ghim, MD  promethazine (PHENERGAN) 25 MG/ML injection Inject 25 mg into the muscle every 6 (six) hours as needed. For nausea/vomiting   Yes Historical Provider, MD  simvastatin (ZOCOR) 40 MG tablet Take 40 mg by mouth  at bedtime.   Yes Historical Provider, MD  warfarin (COUMADIN) 5 MG tablet Take 5 mg by mouth every evening.    Yes Historical Provider, MD    Scheduled Meds:    . acetaminophen  650 mg Oral Once  . ceFEPime (MAXIPIME) IV  2 g Intravenous To Major  . ceFEPime (MAXIPIME) IV  2 g Intravenous Q12H  . citalopram  10 mg Oral QHS  . collagenase  1 application Topical Daily  . cycloSPORINE  1 drop Both Eyes BID  . diphenhydrAMINE  25 mg Intravenous Once  . ferrous sulfate  325 mg Oral BID  . furosemide  20 mg Intravenous Once  . furosemide  40 mg Intravenous Once  . gabapentin  600 mg Oral QID  . insulin aspart  0-9 Units Subcutaneous Q4H  . methylphenidate  10 mg Oral BID AC  . mulitivitamin with minerals  1 tablet Oral BID  . pantoprazole (PROTONIX) IV  40 mg Intravenous Q12H  . phytonadione  5 mg Oral Once  . potassium chloride  10 mEq Intravenous Q1 Hr x 4  . potassium chloride  40 mEq Oral BID  . simvastatin  40 mg Oral QHS  . DISCONTD: insulin aspart  0-9 Units Subcutaneous TID WC  . DISCONTD: multivitamins ther. w/minerals  2 tablet Oral Daily  . DISCONTD: phytonadione  10 mg Oral Once   Infusions:    . sodium chloride 1,000 mL (08/14/11 0646)   PRN Meds: acetaminophen, acetaminophen, albuterol,  ALPRAZolam, HYDROcodone-acetaminophen, methocarbamol, ondansetron (ZOFRAN) IV, ondansetron   Allergies as of 08/13/2011 - Review Complete 08/13/2011  Allergen Reaction Noted  . Ace inhibitors Shortness Of Breath 04/26/2011  . Vancomycin Other (See Comments) 10/10/2010    Family History  Problem Relation Age of Onset  . Coronary artery disease    . Diabetes    . Cancer Mother   . Diabetes Mother   . Heart disease Father   . Hyperlipidemia Father   . Hypertension Father   . Stroke Father   . Heart disease Brother     History   Social History  . Marital Status: Divorced    Spouse Name: N/A    Number of Children: N/A  . Years of Education: N/A   Occupational History  . Not on file.   Social History Main Topics  . Smoking status: Never Smoker   . Smokeless tobacco: Never Used  . Alcohol Use: Yes     rare on holidays  . Drug Use: No  . Sexually Active: Not Currently   Other Topics Concern  . Not on file   Social History Narrative   DivorcedOne of 10 children5 in [place] and was living with sister at time of CVANonsmokerNondrinker    REVIEW OF SYSTEMS: Constitutional:  Patient says she used to weigh well into main 300 pound range but that she is lost weight since her stroke and her current weight is 238 pounds. ENT:  No nosebleeds no sinus congestion Pulm:  No cough, no shortness of breath. CV:  No chest pain. Does have swelling of the feet bilaterally but its worse on the left GU:  No hematuria, does have urinary frequency and some dysuria. GI:  Denies dysphagia, though she has lost a lot of teeth she does not report trouble chewing her food. Normally moves her bowels about once a day. Heme:  On iron for chronic anemia. Do not see that she's never had a bone marrow biopsy.   Transfusions:  No previous  transfusions Neuro:  No headaches. She does report decreased vision on the right eye and has been seen in the ophthalmologist for this apparently that physician is  considering surgery for what sounds like diabetic eye disease. Derm:  No rash, no itching. Does have a sore on her left foot which is causing pain Endocrine:  No excessive thirst nor excessive urination Immunization:  Vaccine, Pneumovax up to date Travel:  none   PHYSICAL EXAM: Vital signs in last 24 hours: Temp:  [97 F (36.1 C)-98.6 F (37 C)] 97.7 F (36.5 C) (03/05 0545) Pulse Rate:  [78-99] 95  (03/05 0545) Resp:  [11-20] 14  (03/05 0545) BP: (100-157)/(51-95) 102/51 mmHg (03/05 0545) SpO2:  [97 %-100 %] 98 % (03/05 0100) Weight:  [238 lb 5.1 oz (108.1 kg)] 238 lb 5.1 oz (108.1 kg) (03/04 2022)  General: overweight African American female. She does not appear acutely ill. Head:  Normocephalic, atraumatic  Eyes:  No conjunctival pallor. Extraocular movements intact Ears:  Patient is not hard of hearing. No hearing aids in place  Nose:  No discharge, no congestion Mouth:  Oropharynx is moist and clear dentition poor with many teeth absent. Neck:  No JVD, no thyromegaly, no bruits, no masses. Lungs:  Clear to auscultation and percussion bilaterally Heart: regular rate rhythm, occasional extra systole. Certainly her rhythm is not currently consistent with A. Fib. Abdomen:  Soft, supple, nontender, active bowel sounds. No hepatosplenomegaly, bruits, masses..   Rectal: deferred   Musc/Skeltl: no joint deformities Extremities:  Pedal edema. Left foot is wrapped in gauze bandage.  Neurologic:  Appropriate, no tremor.  She can move all 4 limbs but both left lower and upper extremity are weak. She's oriented x3. Skin:  No rash, no sores. Tattoos:  None see Nodes:  None at the neck or groin.   Psych:  Pleasant, cooperative, not agitated.  Intake/Output from previous day: 03/04 0701 - 03/05 0700 In: 1148 [P.O.:120; I.V.:100; Blood:928] Out: 2000 [Urine:2000] Intake/Output this shift: Total I/O In: 100 [IV Piggyback:100] Out: -   LAB RESULTS:  Basename 08/14/11 0623 08/13/11  2107 08/13/11 1445  WBC 4.1 -- 7.2  HGB 8.6* 11.7* 11.6*  HCT 26.7* 35.8* 36.9  PLT 258 -- 330   BMET Lab Results  Component Value Date   NA 143 08/14/2011   NA 139 08/13/2011   NA 141 08/09/2011   K 2.9* 08/14/2011   K 3.4* 08/13/2011   K 4.2 08/09/2011   CL 110 08/14/2011   CL 107 08/13/2011   CL 108 08/09/2011   CO2 27 08/14/2011   CO2 22 08/13/2011   CO2 20 08/09/2011   GLUCOSE 65* 08/14/2011   GLUCOSE 64* 08/13/2011   GLUCOSE 189* 08/09/2011   BUN 30* 08/14/2011   BUN 33* 08/13/2011   BUN 32* 08/09/2011   CREATININE 1.13* 08/14/2011   CREATININE 1.31* 08/13/2011   CREATININE 1.06 08/09/2011   CALCIUM 8.1* 08/14/2011   CALCIUM 8.4 08/13/2011   CALCIUM 8.6 08/09/2011   LFT  Basename 08/14/11 0623  PROT 5.3*  ALBUMIN 2.3*  AST 35  ALT 29  ALKPHOS 67  BILITOT 0.2*  BILIDIR --  IBILI --   PT/INR Lab Results  Component Value Date   INR 2.06* 08/14/2011   INR 11.24* 08/13/2011   INR 2.51* 05/16/2011   Hepatitis Panel No results found for this basename: HEPBSAG,HCVAB,HEPAIGM,HEPBIGM in the last 72 hours C-Diff No components found with this basename: cdiff    Drugs of Abuse  Component Value Date/Time   LABOPIA NONE DETECTED 04/23/2011 0714   COCAINSCRNUR NONE DETECTED 04/23/2011 0714   LABBENZ NONE DETECTED 04/23/2011 0714   AMPHETMU NONE DETECTED 04/23/2011 0714   THCU NONE DETECTED 04/23/2011 0714   LABBARB NONE DETECTED 04/23/2011 0714     RADIOLOGY STUDIES: No results found.  ENDOSCOPIC STUDIES: 06/11/2010   EGD  Delfin Edis  To evaluate hemetemesis Mild, distal esophagitis.  Sessile polyp at duodenal bulb. Treated with Carafate and PPI.  Pathology:   1. Duodenum, biopsy, : CHRONIC DUODENITIS CONSISTENT WITH PEPTIC DUODENITIS.NO VILLOUS ATROPHY.HELICOBACTER PYLORI OR MALIGNANCY IDENTIFIED 2. Stomach, biopsy, antrum : GASTRIC BODY AND ANTRAL MUCOSA WITH ASSOCIATED MILD CHRONIC INFLAMMATION AND REACTIVE/REGENERATIVE CHANGES, NO EVIDENCE OF HELICOBACTER PYLORI,  INTESTINAL METAPLASIA, DYSPLASIA OR MALIGNANCY 3. Esophagus, biopsy, distal : GASTROESOPHAGEAL JUNCTION MUCOSA WITH MILD INFLAMMATION CONSISTENT WITH GASTROESOPHAGEAL REFLUX.NO INTESTINAL METAPLASIA, DYSPLASIA OR MALIGNANCY IDENTIFIED.   IMPRESSION: 1. GI bleed in the setting of supratherapeutic INR which is in the process of being corrected. Rule out peptic ulcer disease, rule out reflux esophagitis. She has a history of hematemesis in January 2012 and findings at endoscopy included mild esophagitis and a benign duodenal bulbar polyp. 2.  Chronic anemia. She has had an acute drop in her H&H compared with late January. However current hemoglobin is within her historic norm as measured 3 months 3 months ago. So I suspect that hemoglobin of 15 in January was elevated to 2 volume contraction in the setting of UTI and acute vomiting. 3. CVA fall of 2012, since then has been on chronic Coumadin. 4.  Type II diabetic. Note that she previously was treated with insulin but this is not on her medication list from SNF. 5. Hypokalemia.  This is being treated with IV potassium chloride 6. Urinary tract infection 7.  Infection of the left foot.  She is previously undergone amputation of a toe from that foot  PLAN: 1.  patient scheduled for endoscopy at about 2:15 this afternoon. In the meantime continue IV protonix, complete transfusion with 2 additional FFP and 2 packed red blood cells.     LOS: 1 day   Azucena Freed  08/14/2011, 10:05 AM Pager: [phone]  GI ATTENDING  HX, LABS, PRIOR ENDO REPORTS REVIEWED. PT SEEN AND EXAMINED. AGREE WITH ABOVE H&P. MINOR GI BLEED IN FACE OF OVER-ANTICOAGULATION. HAD N/V. HG PROBABLY QUITE CLOSE TO BASELINE. EGD LAST YEAR FOR THE SAME. PLAEND EGD TODAY TO R/O SIGNIFICANT GI MUCOSAL PATHOLOGY. KEEP ON PPI.The nature of the procedure, as well as the risks, benefits, and alternatives were carefully and thoroughly reviewed with the patient. Ample time for discussion and  questions allowed. The patient understood, was satisfied, and agreed to proceed.   Docia Chuck. Geri Seminole., M.D. [facility] Division of Gastroenterology

## 2011-08-14 NOTE — Op Note (Signed)
[facility] [place], [place]  [id number]  ENDOSCOPY PROCEDURE REPORT  PATIENT:  Sherry, Murphy  MR#:  [id number] BIRTHDATE:  23-Mar-1961, 50 yrs. old  GENDER:  female  ENDOSCOPIST:  Docia Chuck. Geri Seminole, MD Referred by:  Sheliah Plane Hospitalist,  PROCEDURE DATE:  08/14/2011 PROCEDURE:  EGD, diagnostic 43235 ASA CLASS:  Class III INDICATIONS:  anemia, hemoccult positive stool, inr 11  MEDICATIONS:   Fentanyl 50 mcg IV, Versed 6 mg IV TOPICAL ANESTHETIC:  Cetacaine Spray  DESCRIPTION OF PROCEDURE:   After the risks benefits and alternatives of the procedure were thoroughly explained, informed consent was obtained.  The Pentax Gastroscope [id number] endoscope was introduced through the mouth and advanced to the third portion of the duodenum, without limitations.  The instrument was slowly withdrawn as the mucosa was fully examined. <<PROCEDUREIMAGES>>  Mild nonerosive gastritis was found antrum  Otherwise the examination of the esophagus, stomach, and duodenum was normal. Retroflexed views revealed no abnormalities.    The scope was then withdrawn from the patient and the procedure completed.  COMPLICATIONS:  None  ENDOSCOPIC IMPRESSION: 1) Mild nonerosive gastritis in the antrum 2) Otherwise normal examination 3) Suspect occult + stool due to minor mucosal oozing in face of markedly elevated INR  RECOMMENDATIONS: 1) Continue PPI 2) ok to anticoagulate if needed. Keep therapeutic  WILL SIGN OFF  ______________________________ Docia Chuck. Geri Seminole, MD  CC:  Ricke Hey MD  n. Lorrin MaisDocia Chuck. Geri Seminole at 08/14/2011 03:26 PM  Juliann Pares, [id number]

## 2011-08-14 NOTE — Consult Note (Signed)
WOC consult Note Reason for Consult: Consult requested for sacrum and left foot wounds. Pt has multiple chronic full thickness necrotic wounds to left lower extremity which appear consistent with vascular insufficiency.  She has been followed in the fast by [facility], and ABI obtained in 1/13 indicated that there was insufficient flow to obtain a value. Pt states, "my doctor told me he would probably have to amputate that foot."  Unable to palpate pulse. Wound type:Sacrum with healing stage 2 wound .2X.2X.1cm, pink and dry. 2 areas near rectum with partial thickness denuded skin, each 1X.5X.1cm, pink and moist.  This is consistent with moisture acquired skin damage R/T frequent incontinent, loose, bloody stools. Pressure Ulcer POA: Yes  Left lower extremity Left great toe tip 2X2X.2cm 100% slough, mod tan drainage. Left outer ankle 1.5X1.5X.3cm, 100% slough with bone palpable, mod tan drainage. Left outer foot 1X1cm 100% slough, mod tan drainage Left outer anterior foot 6X4cm 100% moist eschar, mod tan drainage All with with some strong odor.  Dressing procedure/placement/frequency: Barrier cream to buttocks to protect and promote healing. Santyl has previously been ordered for chemical debridement of nonviable tissue.  Will continue present plan of care.  Primary physician at bedside to assess site during dressing change.  Recommend [facility] consult for further plan of care,  Topical care will be minimally effective to promote healing R/T vascular insufficiency.  Float heel to reduce pressure. Will not plan to follow further unless re-consulted.   Will not plan to follow further unless re-consulted.  Gae Dry, RN, MSN, [place] :

## 2011-08-15 ENCOUNTER — Encounter (HOSPITAL_COMMUNITY): Payer: Self-pay | Admitting: Internal Medicine

## 2011-08-15 LAB — GLUCOSE, CAPILLARY
Glucose-Capillary: 119 mg/dL — ABNORMAL HIGH (ref 70–99)
Glucose-Capillary: 136 mg/dL — ABNORMAL HIGH (ref 70–99)

## 2011-08-15 LAB — PREPARE FRESH FROZEN PLASMA
Unit division: 0
Unit division: 0

## 2011-08-15 LAB — TYPE AND SCREEN
Antibody Screen: NEGATIVE
Unit division: 0

## 2011-08-15 LAB — URINE CULTURE: Culture  Setup Time: 201303050134

## 2011-08-15 LAB — BASIC METABOLIC PANEL
CO2: 25 mEq/L (ref 19–32)
Calcium: 8.3 mg/dL — ABNORMAL LOW (ref 8.4–10.5)
Chloride: 109 mEq/L (ref 96–112)
Creatinine, Ser: 0.93 mg/dL (ref 0.50–1.10)
Glucose, Bld: 99 mg/dL (ref 70–99)

## 2011-08-15 LAB — CBC
HCT: 32.8 % — ABNORMAL LOW (ref 36.0–46.0)
MCH: 27.9 pg (ref 26.0–34.0)
MCV: 84.8 fL (ref 78.0–100.0)
Platelets: 228 10*3/uL (ref 150–400)
RDW: 16.1 % — ABNORMAL HIGH (ref 11.5–15.5)
WBC: 7.8 10*3/uL (ref 4.0–10.5)

## 2011-08-15 MED ORDER — FERROUS SULFATE 325 (65 FE) MG PO TABS
325.0000 mg | ORAL_TABLET | Freq: Every day | ORAL | Status: DC
  Administered 2011-08-16: 325 mg via ORAL
  Filled 2011-08-15 (×3): qty 1

## 2011-08-15 MED ORDER — INSULIN ASPART 100 UNIT/ML ~~LOC~~ SOLN: 0.0000 [IU] | Freq: Three times a day (TID) | SUBCUTANEOUS | Status: DC

## 2011-08-15 MED ORDER — INSULIN ASPART 100 UNIT/ML ~~LOC~~ SOLN
0.0000 [IU] | Freq: Three times a day (TID) | SUBCUTANEOUS | Status: DC
  Administered 2011-08-15 – 2011-08-16 (×3): 1 [IU] via SUBCUTANEOUS
  Administered 2011-08-16: 13:00:00 3 [IU] via SUBCUTANEOUS
  Filled 2011-08-15: qty 3

## 2011-08-15 MED ORDER — WARFARIN SODIUM 7.5 MG PO TABS
7.5000 mg | ORAL_TABLET | Freq: Once | ORAL | Status: AC
  Administered 2011-08-15: 18:00:00 7.5 mg via ORAL
  Filled 2011-08-15: qty 1

## 2011-08-15 MED ORDER — INSULIN ASPART 100 UNIT/ML ~~LOC~~ SOLN
0.0000 [IU] | Freq: Every day | SUBCUTANEOUS | Status: DC
  Filled 2011-08-15: qty 3

## 2011-08-15 MED ORDER — WARFARIN - PHARMACIST DOSING INPATIENT
Freq: Every day | Status: DC
  Administered 2011-08-15 – 2011-08-16 (×2)
  Filled 2011-08-15 (×4): qty 1

## 2011-08-15 MED ORDER — ENOXAPARIN SODIUM 120 MG/0.8ML ~~LOC~~ SOLN
110.0000 mg | Freq: Two times a day (BID) | SUBCUTANEOUS | Status: DC
  Administered 2011-08-15 (×2): 110 mg via SUBCUTANEOUS
  Administered 2011-08-16: 13:00:00 via SUBCUTANEOUS
  Filled 2011-08-15 (×4): qty 0.8

## 2011-08-15 MED ORDER — NITROFURANTOIN MONOHYD MACRO 100 MG PO CAPS
100.0000 mg | ORAL_CAPSULE | Freq: Two times a day (BID) | ORAL | Status: DC
  Administered 2011-08-15 – 2011-08-16 (×3): 100 mg via ORAL
  Filled 2011-08-15 (×4): qty 1

## 2011-08-15 NOTE — Progress Notes (Signed)
UR completed. [name] Slovan 08/15/2011 [phone]

## 2011-08-15 NOTE — Progress Notes (Signed)
ANTICOAGULATION CONSULT NOTE - Initial Consult  Pharmacy Consult for Lovenox bridge to therapeutic INR with warfarin Indication: Hx CVA and mural thrombus  Allergies  Allergen Reactions  . Ace Inhibitors Shortness Of Breath  . Vancomycin Other (See Comments)    Unknown     Patient Measurements: Height: 5\' 7"  (170.2 cm) Weight: 238 lb 5.1 oz (108.1 kg) IBW/kg (Calculated) : 61.6   Vital Signs: Temp: 99.6 F (37.6 C) (03/06 0824) Temp src: Oral (03/06 0824) BP: 143/74 mmHg (03/06 0824) Pulse Rate: 114  (03/06 0824)  Labs:  Basename 08/15/11 0505 08/14/11 1020 08/14/11 0623 08/13/11 1445  HGB 10.8* 10.4* -- --  HCT 32.8* 32.2* 26.7* --  PLT 228 262 258 --  APTT -- -- -- --  LABPROT 17.0* 19.4* 23.6* --  INR 1.36 1.61* 2.06* --  HEPARINUNFRC -- -- -- --  CREATININE 0.93 -- 1.13* 1.31*  CKTOTAL -- -- -- --  CKMB -- -- -- --  TROPONINI -- -- -- --   Estimated Creatinine Clearance: 93.7 ml/min (by C-G formula based on Cr of 0.93).  Medical History: Past Medical History  Diagnosis Date  . HTN (hypertension)   . HLD (hyperlipidemia)   . History of methicillin resistant staphylococcus aureus (MRSA)   . Sleep apnea   . CVA (cerebral infarction)   . Anemia   . Tachycardia   . Obesity   . Renal failure     due to vanc toxicity  . Osteomyelitis   . Lower back pain     Chronic  . Idiopathic peripheral neuropathy     NOS  . DJD (degenerative joint disease)   . Headache   . GERD (gastroesophageal reflux disease)   . DM type 2 (diabetes mellitus, type 2)   . Depression   . Anxiety   . Hypokalemia     resolved  . Cerebral infarct     left parietal and bilateral  . Esophagitis     distal  . Fluid overload     compensated  . Urinary retention   . H/O: pneumonia   . History of bacteremia   . Surgery, other elective     of the ear  . Stroke     Hx of left frontoparietal stroke in 12/11. History of right brain stroke.  . Cardiomyopathy     Echocardiogram  04/23/11: Mild LVH, EF 30-35%, mild MR, mild LAE, mild-moderate RVE.  TEE 04/25/11: Moderate LVH, severe biventricular dysfunction, EF 20-25%, moderate to severe MR, moderate TR, biatrial enlargement, no PFO, negative bubble study    Medications:  Prescriptions prior to admission  Medication Sig Dispense Refill  . acetaminophen (TYLENOL) 650 MG CR tablet Take 650 mg by mouth every 4 (four) hours as needed. For chronic pain.      Marland Kitchen Alum & Mag Hydroxide-Simeth (MAALOX PLUS PO) Take 30 mLs by mouth 4 (four) times daily as needed. For indigestion.      Marland Kitchen BIOTIN PO Take 15 mLs by mouth 2 (two) times daily. Biotin Suspension. Swish and spit      . ciprofloxacin (CIPRO) 500 MG tablet Take 1 tablet (500 mg total) by mouth 2 (two) times daily.  14 tablet  0  . citalopram (CELEXA) 10 MG tablet Take 10 mg by mouth at bedtime.       . collagenase (SANTYL) ointment Apply 1 application topically daily. Apply to left great toe and to left foot.      . cycloSPORINE (RESTASIS) 0.05 % ophthalmic emulsion  Place 1 drop into both eyes 2 (two) times daily.       Marland Kitchen doxycycline (VIBRA-TABS) 100 MG tablet Take 100 mg by mouth 2 (two) times daily.      . ferrous sulfate 325 (65 FE) MG tablet Take 325 mg by mouth 2 (two) times daily.       . furosemide (LASIX) 20 MG tablet Take 20 mg by mouth daily.        Marland Kitchen gabapentin (NEURONTIN) 300 MG capsule Take 600 mg by mouth 4 (four) times daily.      Marland Kitchen glimepiride (AMARYL) 2 MG tablet Take 2 mg by mouth daily before breakfast.        . HYDROcodone-acetaminophen (VICODIN) 5-500 MG per tablet Take 1-2 tablets by mouth every 4 (four) hours. For pain      . losartan (COZAAR) 100 MG tablet Take 100 mg by mouth daily.        . methocarbamol (ROBAXIN) 500 MG tablet Take 500 mg by mouth every 6 (six) hours as needed. For muscle spasms.      . methylphenidate (RITALIN) 10 MG tablet Take 10 mg by mouth 2 (two) times daily.        . metoprolol (LOPRESSOR) 100 MG tablet Take 100 mg by mouth  2 (two) times daily.      . Multiple Vitamins-Minerals (MULTIVITAMINS THER. W/MINERALS) TABS Take 2 tablets by mouth daily.      . ondansetron (ZOFRAN) 4 MG tablet Take 1 tablet (4 mg total) by mouth every 6 (six) hours.  12 tablet  0  . promethazine (PHENERGAN) 25 MG/ML injection Inject 25 mg into the muscle every 6 (six) hours as needed. For nausea/vomiting      . simvastatin (ZOCOR) 40 MG tablet Take 40 mg by mouth at bedtime.      Marland Kitchen warfarin (COUMADIN) 5 MG tablet Take 5 mg by mouth every evening.        Scheduled:    . ceFEPime (MAXIPIME) IV  2 g Intravenous Q12H  . citalopram  10 mg Oral QHS  . collagenase  1 application Topical Daily  . cycloSPORINE  1 drop Both Eyes BID  . ferrous sulfate  325 mg Oral Q breakfast  . furosemide  40 mg Intravenous Once  . gabapentin  600 mg Oral QID  . insulin aspart  0-5 Units Subcutaneous QHS  . insulin aspart  0-9 Units Subcutaneous TID WC  . methylphenidate  10 mg Oral BID AC  . mulitivitamin with minerals  1 tablet Oral BID  . pantoprazole (PROTONIX) IV  40 mg Intravenous Q12H  . potassium chloride  10 mEq Intravenous Q1 Hr x 4  . potassium chloride  40 mEq Oral BID  . simvastatin  40 mg Oral QHS  . DISCONTD: ferrous sulfate  325 mg Oral BID  . DISCONTD: insulin aspart  0-9 Units Subcutaneous Q4H  . DISCONTD: insulin aspart  0-9 Units Subcutaneous TID AC    Assessment: 51 y.o. F admitted from a SNF on 3/4 with a SUPRAtherapeutic INR and black tarry stools. Prior to admission, the patient was taking warfarin for anticoagulation for hx CVA and possible mural thrombus. Elevated INR on admit was likely due to Cipro/Doxy PTA -- both of these are known to increase warfarin sensitivity. The patient received Vit K 5 mg po and FFP to reverse INR on admission for GI evaluation. The patient is s/p EGD on 3/5 that showed mild gastritis in the antrum and otherwise normal exam.  GI suspects bleed due to minor mucosal oozing in setting of elevated INR and  okayed resuming anticoagulation.   The patient is now to start lovenox bridge to a therapeutic INR with warfarin. The patient's last known CVA was November '12 (~4 months ago). INR is SUBtherapeutic this a.m. (INR 1.36, goal of 2-3). Wt: 108 kg, SCr 0.93, CrCl~80-100 ml/min. Will likely see residual effects of Vit K x several days.   Goal of Therapy:  INR 2-3, anti-Xa level 0.6-1.2   Plan:  1. Lovenox 110 mg SQ every 12 hours 2. Warfarin 7.5 mg x 1 dose at 1800 today 3. Daily PT/INR 4. Will continue to monitor for any signs/symptoms of bleeding and will follow up with PT/INR in the a.m.   Alycia Rossetti, PharmD, BCPS Clinical Pharmacist Pager: [phone] 08/15/2011 11:20 AM

## 2011-08-15 NOTE — Progress Notes (Signed)
.  Clinical social worker completed patient psychosocial assessment, please see assessment in patient shadow chart.   CSW met with pt and pt sister at bedside to discuss pt disposition plans. Pt plans to return to snf when medically stable. CSW and Pt plan to discuss disposition plans further and options of facilities. Pt interested in potential new skilled nursing facility placement or returning to [facility] when medically stable.   .Clinical social worker initiated skilled nursing facility search, see placement note in patient shadow chart.  .Clinical social worker continuing to follow pt to assist with pt dc plans and further csw needs.   Dorathy Kinsman, [place] .08/15/2011 12:40

## 2011-08-15 NOTE — Progress Notes (Signed)
[facility] Inpatient Progress Note  08/15/2011  Subjective: This morning patient reports that she is hungry and wanting to eat breakfast.  No complaints at this time.  Pt tolerated EGD procedure well.  No active rectal bleeding reported.   Objective:  Vital signs in last 24 hours: Filed Vitals:   08/14/11 2330 08/14/11 2356 08/15/11 0539 08/15/11 0824  BP: 93/60 103/53 139/64 143/74  Pulse: 106 112 116 114  Temp: 99 F (37.2 C) 99.1 F (37.3 C) 98.9 F (37.2 C) 99.6 F (37.6 C)  TempSrc: Oral Oral Oral Oral  Resp: 16 20 16 18   Height:      Weight:      SpO2:  95% 97% 100%   Weight change:   Intake/Output Summary (Last 24 hours) at 08/15/11 0835 Last data filed at 08/15/11 0500  Gross per 24 hour  Intake   3901 ml  Output   3600 ml  Net    301 ml    Review of Systems Pertinent items are noted in HPI.  Otherwise all reviewed and reported as negative.   Physical Exam General appearance: Awake, alert, not in any distress. Speech Clear.  HEENT: Atraumatic and Normocephalic, pupils equally reactive to light   Neck: supple, no JVD. No cervical lymphadenopathy.  Chest: BBS clear CVS: S1 S2 normal, no murmurs.  Abdomen: Bowel sounds present, Non tender and not distended with no gaurding, rigidity rebound. Obese abdomen.  Extremities: B/L Lower Ext shows no edema, left leg shows numerous ischemic ulcers on her toes and on her lateral malleolus. There is gangrenous/necrotic area on her great toe and on the lateral/plantar aspect of the left foot with bone exposed.  Neurology: Awake alert, and oriented X 3, left upper extremity 2/5, left lower extremity is 4/5. Right side 5/5.   Lab Results:   Micro Results: Recent Results (from the past 240 hour(s))  URINE CULTURE     Status: Normal (Preliminary result)   Collection Time   08/13/11  7:26 PM      Component Value Range Status Comment   Specimen Description URINE, CLEAN CATCH   Final    Special Requests ADDED ON [id number]  @1934    Final    Culture  Setup Time [id number]   Final    Colony Count >=100,000 COLONIES/ML   Final    Culture ESCHERICHIA COLI   Final    Report Status PENDING   Incomplete   MRSA PCR SCREENING     Status: Normal   Collection Time   08/13/11  8:14 PM      Component Value Range Status Comment   MRSA by PCR NEGATIVE  NEGATIVE  Final     Studies/Results: Reviewed lab results.   Medications:  Scheduled Meds:   . ceFEPime (MAXIPIME) IV  2 g Intravenous Q12H  . citalopram  10 mg Oral QHS  . collagenase  1 application Topical Daily  . cycloSPORINE  1 drop Both Eyes BID  . ferrous sulfate  325 mg Oral Q breakfast  . furosemide  40 mg Intravenous Once  . gabapentin  600 mg Oral QID  . insulin aspart  0-5 Units Subcutaneous QHS  . insulin aspart  0-9 Units Subcutaneous TID WC  . methylphenidate  10 mg Oral BID AC  . mulitivitamin with minerals  1 tablet Oral BID  . pantoprazole (PROTONIX) IV  40 mg Intravenous Q12H  . potassium chloride  10 mEq Intravenous Q1 Hr x 4  . potassium chloride  40 mEq  Oral BID  . simvastatin  40 mg Oral QHS  . DISCONTD: ferrous sulfate  325 mg Oral BID  . DISCONTD: insulin aspart  0-9 Units Subcutaneous TID WC  . DISCONTD: insulin aspart  0-9 Units Subcutaneous Q4H  . DISCONTD: insulin aspart  0-9 Units Subcutaneous TID AC   Continuous Infusions:   . sodium chloride 100 mL/hr at 08/15/11 0600  . DISCONTD: sodium chloride 500 mL (08/14/11 1315)   PRN Meds:.acetaminophen, acetaminophen, albuterol, ALPRAZolam, HYDROcodone-acetaminophen, methocarbamol, ondansetron (ZOFRAN) IV, ondansetron, DISCONTD: butamben-tetracaine-benzocaine, DISCONTD: fentaNYL, DISCONTD: midazolam  Assessment/Plan: Patient Active Hospital Problem List: GI bleed (08/13/2011)  - per GI, no significant mucosal pathology, OK to start anticoagulation and keep therapeutic, will ask pharmacy to resume anticoagulation with warfarin.  Monitor PT    DIABETES MELLITUS, TYPE II (01/03/2007)    - multiple complications noted, continue monitoring closely and treating with supplemental insulin, holding oral meds at this while in the hospital.    HYPERLIPIDEMIA (01/03/2007)   - stable, continue home meds  NEUROPATHY, IDIOPATHIC PERIPHERAL NOS (01/07/2007) - stable, continue home meds    HYPERTENSION (07/07/2010)   - currently stable, controlled  CVA (07/07/2010)   - thought to be from embolic source, resume oral warfarin as above.  PVD (peripheral vascular disease) (07/11/2011)   - currently stable, pt not a candidate for revascularization per recent note from vascular service.   Coumadin toxicity (08/13/2011)  - resolved Urinary tract infection with pyuria (08/13/2011)  - E. Coli - awaiting urine sensitivity results, continue cefipime for now, transition to oral meds when sensitivities are available.  ARF (acute renal failure) (08/13/2011)   - resolved now.  Change IVF to [facility]  Anemia due to acute blood loss (08/14/2011) - stable, following  Mural thrombus of cardiac apex (08/14/2011) - resume warfarin therapy  Hypokalemia - resolved now after IV replacement.    LOS: 2 days   Abbee Cremeens 08/15/2011, 8:35 AM   Murlean Iba, MD, CDE, FAAFP [facility] [place], [place]

## 2011-08-16 ENCOUNTER — Encounter: Payer: Self-pay | Admitting: Vascular Surgery

## 2011-08-16 LAB — COMPREHENSIVE METABOLIC PANEL
ALT: 59 U/L — ABNORMAL HIGH (ref 0–35)
AST: 41 U/L — ABNORMAL HIGH (ref 0–37)
Albumin: 2.2 g/dL — ABNORMAL LOW (ref 3.5–5.2)
Alkaline Phosphatase: 109 U/L (ref 39–117)
BUN: 15 mg/dL (ref 6–23)
Chloride: 107 mEq/L (ref 96–112)
Potassium: 4 mEq/L (ref 3.5–5.1)
Total Bilirubin: 0.6 mg/dL (ref 0.3–1.2)

## 2011-08-16 LAB — GLUCOSE, CAPILLARY: Glucose-Capillary: 108 mg/dL — ABNORMAL HIGH (ref 70–99)

## 2011-08-16 LAB — PROTIME-INR: INR: 1.31 (ref 0.00–1.49)

## 2011-08-16 LAB — CBC
HCT: 38.2 % (ref 36.0–46.0)
RDW: 15.8 % — ABNORMAL HIGH (ref 11.5–15.5)
WBC: 9.2 10*3/uL (ref 4.0–10.5)

## 2011-08-16 MED ORDER — PANTOPRAZOLE SODIUM 40 MG IV SOLR: 40.0000 mg | Freq: Every day | INTRAVENOUS | Status: DC

## 2011-08-16 MED ORDER — METHYLPHENIDATE HCL 10 MG PO TABS: 10.0000 mg | ORAL_TABLET | Freq: Two times a day (BID) | ORAL | Status: DC

## 2011-08-16 MED ORDER — PANTOPRAZOLE SODIUM 40 MG PO TBEC: 40.0000 mg | DELAYED_RELEASE_TABLET | Freq: Every day | ORAL | Status: DC

## 2011-08-16 MED ORDER — NITROFURANTOIN MONOHYD MACRO 100 MG PO CAPS: 100.0000 mg | ORAL_CAPSULE | Freq: Two times a day (BID) | ORAL | Status: AC

## 2011-08-16 MED ORDER — HYDROCODONE-ACETAMINOPHEN 5-325 MG PO TABS: 1.0000 | ORAL_TABLET | ORAL | Status: AC | PRN

## 2011-08-16 MED ORDER — ALPRAZOLAM 0.25 MG PO TABS: 0.2500 mg | ORAL_TABLET | Freq: Three times a day (TID) | ORAL | Status: DC | PRN

## 2011-08-16 MED ORDER — ENOXAPARIN SODIUM 100 MG/ML ~~LOC~~ SOLN: 110.0000 mg | Freq: Two times a day (BID) | SUBCUTANEOUS | Status: DC

## 2011-08-16 MED ORDER — WARFARIN SODIUM 7.5 MG PO TABS
7.5000 mg | ORAL_TABLET | Freq: Once | ORAL | Status: AC
  Administered 2011-08-16: 7.5 mg via ORAL
  Filled 2011-08-16: qty 1

## 2011-08-16 NOTE — Progress Notes (Signed)
.  Clinical social worker assisted with patient discharge to skilled nursing facility.  .Patient transportation provided by [facility] [facility] with patient chart copy.   Pt RN given ambulance form and discharge directions for ambulance transportation. Pt anticipated to dc to snf after pt has eaten dinner.   .No further Clinical Social Work needs, signing off.   Dorathy Kinsman, [place] .08/16/2011 17:32pm

## 2011-08-16 NOTE — Progress Notes (Signed)
ANTICOAGULATION CONSULT NOTE - Follow Up Consult  Pharmacy Consult for Lovenox >> Coumadin Indication: hx CVA and mural thrombus  Allergies  Allergen Reactions  . Ace Inhibitors Shortness Of Breath  . Vancomycin Other (See Comments)    Unknown     Patient Measurements: Height: 5\' 7"  (170.2 cm) Weight: 231 lb 7.7 oz (105 kg) IBW/kg (Calculated) : 61.6   Vital Signs: Temp: 98.1 F (36.7 C) (03/07 0740) Temp src: Axillary (03/07 0740) BP: 179/91 mmHg (03/07 0740) Pulse Rate: 115  (03/07 0740)  Labs:  Basename 08/16/11 0510 08/15/11 0505 08/14/11 1020 08/14/11 0623  HGB 12.7 10.8* -- --  HCT 38.2 32.8* 32.2* --  PLT 278 228 262 --  APTT -- -- -- --  LABPROT 16.5* 17.0* 19.4* --  INR 1.31 1.36 1.61* --  HEPARINUNFRC -- -- -- --  CREATININE 0.89 0.93 -- 1.13*  CKTOTAL -- -- -- --  CKMB -- -- -- --  TROPONINI -- -- -- --   Estimated Creatinine Clearance: 96.4 ml/min (by C-G formula based on Cr of 0.89).   Medications:  Scheduled:    . citalopram  10 mg Oral QHS  . collagenase  1 application Topical Daily  . cycloSPORINE  1 drop Both Eyes BID  . enoxaparin (LOVENOX) injection  110 mg Subcutaneous Q12H  . ferrous sulfate  325 mg Oral Q breakfast  . gabapentin  600 mg Oral QID  . insulin aspart  0-5 Units Subcutaneous QHS  . insulin aspart  0-9 Units Subcutaneous TID WC  . methylphenidate  10 mg Oral BID AC  . mulitivitamin with minerals  1 tablet Oral BID  . nitrofurantoin (macrocrystal-monohydrate)  100 mg Oral Q12H  . pantoprazole (PROTONIX) IV  40 mg Intravenous Q12H  . potassium chloride  40 mEq Oral BID  . simvastatin  40 mg Oral QHS  . warfarin  7.5 mg Oral ONCE-1800  . Warfarin - Pharmacist Dosing Inpatient   Does not apply q1800  . DISCONTD: ceFEPime (MAXIPIME) IV  2 g Intravenous Q12H    Assessment: 51 y.o. F admitted from a SNF on 3/4 with a SUPRAtherapeutic INR and black tarry stools. Prior to admission, the patient was taking warfarin for  anticoagulation for hx CVA and possible mural thrombus. Elevated INR on admit was likely due to Cipro/Doxy PTA -- both of these are known to increase warfarin sensitivity. The patient received Vit K 5 mg po and FFP to reverse INR on admission for GI evaluation. The patient is s/p EGD on 3/5 that showed mild gastritis in the antrum and otherwise normal exam. GI suspects bleed due to minor mucosal oozing in setting of elevated INR and okayed resuming anticoagulation.   INR today remains subtherapeutic as expected after reversal and only one dose of Coumadin.  Will need to continue Lovenox bridging.  Goal of Therapy:  INR 2-3, anti-Xa level 0.6-1.2   Plan:  1. Continue Lovenox 49 mg SQ Q12h. 2. Repeat Coumadin 7.5mg  tonight. 3. Follow up with INR in AM.  Horton Chin, Pharm.D., BCPS Clinical Pharmacist Pager [phone]  08/16/2011,9:05 AM

## 2011-08-16 NOTE — Discharge Summary (Signed)
HOSPITAL DISCHARGE SUMMARY    @n   [name], 51 y.o., DOB November 25, 1960, MRN [id number]  Admission date: 08/13/2011 Discharge Date: 08/16/2011  Primary MD Ricke Hey, MD, MD  Admitting Physician Oren Binet, MD  Admission Diagnosis  Coumadin toxicity [phone], E980.4] Coumadin Toxicity heme positive stool, anemia.  Discharge Diagnoses:   No resolved problems to display.  Active Hospital Problems  Diagnoses Date Noted   . GI bleed 08/13/2011   . Anemia due to acute blood loss 08/14/2011   . Mural thrombus of cardiac apex 08/14/2011   . Coumadin toxicity 08/13/2011   . Urinary tract infection with pyuria 08/13/2011   . ARF (acute renal failure) 08/13/2011   . PVD (peripheral vascular disease) 07/11/2011   . HYPERTENSION 07/07/2010   . CVA 07/07/2010   . NEUROPATHY, IDIOPATHIC PERIPHERAL NOS 01/07/2007   . HYPERLIPIDEMIA 01/03/2007   . DIABETES MELLITUS, TYPE II 01/03/2007     Resolved Hospital Problems  Diagnoses Date Noted Date Resolved    Past Medical History  Diagnosis Date  . HTN (hypertension)   . HLD (hyperlipidemia)   . History of methicillin resistant staphylococcus aureus (MRSA)   . Sleep apnea   . CVA (cerebral infarction)   . Anemia   . Tachycardia   . Obesity   . Renal failure     due to vanc toxicity  . Osteomyelitis   . Lower back pain     Chronic  . Idiopathic peripheral neuropathy     NOS  . DJD (degenerative joint disease)   . Headache   . GERD (gastroesophageal reflux disease)   . DM type 2 (diabetes mellitus, type 2)   . Depression   . Anxiety   . Hypokalemia     resolved  . Cerebral infarct     left parietal and bilateral  . Esophagitis     distal  . Fluid overload     compensated  . Urinary retention   . H/O: pneumonia   . History of bacteremia   . Surgery, other elective     of the ear  . Stroke     Hx of left frontoparietal stroke in 12/11. History of right brain stroke.  . Cardiomyopathy     Echocardiogram  04/23/11: Mild LVH, EF 30-35%, mild MR, mild LAE, mild-moderate RVE.  TEE 04/25/11: Moderate LVH, severe biventricular dysfunction, EF 20-25%, moderate to severe MR, moderate TR, biatrial enlargement, no PFO, negative bubble study    Past Surgical History  Procedure Date  . Tympanoplasty   . Toe amputation     left 2nd toe  . Dilation and curettage of uterus     history  . Tee without cardioversion 04/25/2011    Procedure: TRANSESOPHAGEAL ECHOCARDIOGRAM (TEE);  Surgeon: Jolaine Artist, MD;  Location: [facility] ENDOSCOPY;  Service: Cardiovascular;  Laterality: N/A;  . Esophagogastroduodenoscopy 08/14/2011    Procedure: ESOPHAGOGASTRODUODENOSCOPY (EGD);  Surgeon: Scarlette Shorts, MD;  Location: [facility] ENDOSCOPY;  Service: Endoscopy;  Laterality: N/A;    Hospital Course See H&P, Labs, Consult and Test reports for all details. In brief, this patient was admitted for elevated INR after patient had been on cipro and doxycycline for a UTI.  She was noted to have black tarry stools and was seen in consultation by the GI service.  She had an EGD and no significant mucosal pathology reported (see full report).  Pt will continue PPI.  Pt was started back on lovenox and coumadin prior to discharge.  She will need to have  her PT/INR monitored daily until therapeutic (INR 2-3).  She will need to continue to take lovenox injections until the PT/INR is therapeutic and then DC the lovenox therapy.  Pt has severe PVD and old healing wound on left foot that will continue to need to be monitored closely and continue close vascular follow up.  There are no known surgical vascular interventions at this time and pt may require amputation in future if wound becomes worse or significant chronic pain develops.  Pt was treated for a MDR e.coli with ceftriaxone and then switched to nitrofurantoin to complete course of treatment.    No resolved problems to display.  Active Hospital Problems  Diagnoses Date Noted   . GI bleed 08/13/2011    . Anemia due to acute blood loss 08/14/2011   . Mural thrombus of cardiac apex 08/14/2011   . Coumadin toxicity 08/13/2011   . Urinary tract infection with pyuria 08/13/2011   . ARF (acute renal failure) 08/13/2011   . PVD (peripheral vascular disease) 07/11/2011   . HYPERTENSION 07/07/2010   . CVA 07/07/2010   . NEUROPATHY, IDIOPATHIC PERIPHERAL NOS 01/07/2007   . HYPERLIPIDEMIA 01/03/2007   . DIABETES MELLITUS, TYPE II 01/03/2007     Resolved Hospital Problems  Diagnoses Date Noted Date Resolved     Today's Assessment:   Subjective:   [name]  Pt is eating very well and has no complaints.  She is asking to sit up in a chair.  No problems and appears in no distress.   Objective:   Blood pressure 176/96, pulse 119, temperature 98.4 F (36.9 C), temperature source Oral, resp. rate 18, height 5\' 7"  (1.702 m), weight 105 kg (231 lb 7.7 oz), SpO2 99.00%.  Intake/Output Summary (Last 24 hours) at 08/16/11 1409 Last data filed at 08/16/11 1211  Gross per 24 hour  Intake   1549 ml  Output   2054 ml  Net   -505 ml    Exam  General - chronically ill appearing HEENT- MMM, NCAT Lungs - BBS Clear today CV - normal s1, s2 sounds ABD - soft, normal BS, nontender EXT - left foot wound clean dressings, no signs of severe infection, poor pedal pulses Neuro - awake, alert and oriented  Cultures - urine culture: MDR E.coli sensitive to nitrofurantoin and ceftriaxone   Lab Results  Component Value Date   WBC 9.2 08/16/2011   HGB 12.7 08/16/2011   HCT 38.2 08/16/2011   PLT 278 08/16/2011   LYMPHOPCT 15 08/09/2011   MONOPCT 4 08/09/2011   EOSPCT 0 08/09/2011   BASOPCT 0 08/09/2011   CMP: Lab Results  Component Value Date   NA 138 08/16/2011   K 4.0 08/16/2011   CL 107 08/16/2011   CO2 25 08/16/2011   BUN 15 08/16/2011   CREATININE 0.89 08/16/2011   PROT 6.1 08/16/2011   ALBUMIN 2.2* 08/16/2011   BILITOT 0.6 08/16/2011   ALKPHOS 109 08/16/2011   AST 41* 08/16/2011   ALT 59* 08/16/2011   .  Discharge Instructions     Please see AVS summary instructions.    DISCHARGE MEDICATION: Medication List  As of 08/16/2011  2:09 PM   STOP taking these medications         ciprofloxacin 500 MG tablet      doxycycline 100 MG tablet      furosemide 20 MG tablet      HYDROcodone-acetaminophen 5-500 MG per tablet      promethazine 25 MG/ML injection  TAKE these medications         acetaminophen 650 MG CR tablet   Commonly known as: TYLENOL   Take 650 mg by mouth every 4 (four) hours as needed. For chronic pain.      ALPRAZolam 0.25 MG tablet   Commonly known as: XANAX   Take 1 tablet (0.25 mg total) by mouth 3 (three) times daily as needed for anxiety.      BIOTIN PO   Take 15 mLs by mouth 2 (two) times daily. Biotin Suspension. Swish and spit      citalopram 10 MG tablet   Commonly known as: CELEXA   Take 10 mg by mouth at bedtime.      collagenase ointment   Commonly known as: SANTYL   Apply 1 application topically daily. Apply to left great toe and to left foot.      cycloSPORINE 0.05 % ophthalmic emulsion   Commonly known as: RESTASIS   Place 1 drop into both eyes 2 (two) times daily.      enoxaparin 100 MG/ML Soln   Commonly known as: LOVENOX   Inject 1.1 mLs (110 mg total) into the skin every 12 (twelve) hours.      ferrous sulfate 325 (65 FE) MG tablet   Take 325 mg by mouth 2 (two) times daily.      gabapentin 300 MG capsule   Commonly known as: NEURONTIN   Take 600 mg by mouth 4 (four) times daily.      glimepiride 2 MG tablet   Commonly known as: AMARYL   Take 2 mg by mouth daily before breakfast.      HYDROcodone-acetaminophen 5-325 MG per tablet   Commonly known as: NORCO   Take 1 tablet by mouth every 4 (four) hours as needed for pain.      losartan 100 MG tablet   Commonly known as: COZAAR   Take 100 mg by mouth daily.      MAALOX PLUS PO   Take 30 mLs by mouth 4 (four) times daily as needed. For indigestion.       methocarbamol 500 MG tablet   Commonly known as: ROBAXIN   Take 500 mg by mouth every 6 (six) hours as needed. For muscle spasms.      methylphenidate 10 MG tablet   Commonly known as: RITALIN   Take 1 tablet (10 mg total) by mouth 2 (two) times daily before a meal.      metoprolol 100 MG tablet   Commonly known as: LOPRESSOR   Take 100 mg by mouth 2 (two) times daily.      multivitamins ther. w/minerals Tabs   Take 2 tablets by mouth daily.      nitrofurantoin (macrocrystal-monohydrate) 100 MG capsule   Commonly known as: MACROBID   Take 1 capsule (100 mg total) by mouth every 12 (twelve) hours.      ondansetron 4 MG tablet   Commonly known as: ZOFRAN   Take 1 tablet (4 mg total) by mouth every 6 (six) hours.      simvastatin 40 MG tablet   Commonly known as: ZOCOR   Take 40 mg by mouth at bedtime.      warfarin 5 MG tablet   Commonly known as: COUMADIN   Take 5 mg by mouth every evening.            Disposition and Follow-up:  Discharge Orders    Future Appointments: Provider: Department: Dept Phone: Center:   08/17/2011  8:30 AM Hinda Lenis, MD [facility] [phone] [facility]   09/17/2011 10:15 AM Charlett Blake, MD [facility] [phone] None   11/30/2011 9:00 AM [facility] [phone] [facility]   11/30/2011 9:45 AM Hinda Lenis, MD [facility] [phone] [facility]     Future Orders Please Complete By Expires   Diet - low sodium heart healthy      Scheduling Instructions:   Diabetic Low Carb Diet Recommended    Increase activity slowly      Discharge instructions      Comments:   Check Blood sugars 3 times per day Check daily PT/INR and STOP giving LOVENOX when INR (2-3) and continue daily warfarin Pt to see Attending MD in 1- 3 days Active Wound Care Protocol to Healing Left foot wound Change Dressings Twice Daily AIR BED Recommended  Out of Bed to Chair At least twice daily Return for worsening symptoms or new changes.       Change dressing (specify)      Comments:   Dressing change: 2 times per day using wet to dry dressings and santyl per wound care recommendations.   Call MD for:  persistant nausea and vomiting      Call MD for:  severe uncontrolled pain      Call MD for:  redness, tenderness, or signs of infection (pain, swelling, redness, odor or green/yellow discharge around incision site)      Call MD for:  difficulty breathing, headache or visual disturbances        Follow-up Information    Follow up with Ricke Hey, MD in 1 week.      Follow up with Attending MD at SNF in 1-3 days in 3 days.         The risks, benefits, and possible side effects of all treatments and tests were explained to the patient.  The patient verbalized understanding.  The importance of close follow up with the primary care medical provider was explained clearly to the patient.  The patient verbalized understanding.  The patient was given instructions to return if symptoms recur, worsen or new changes develop.  The patient verbalized understanding.   Murlean Iba, MD, CDE, [place], [place]   Total Time spent reviewing critical document, reviewing this patient's comprehensive hospitalization, arranging follow up and coordination of care, reviewing data and todays exam greater than 35 minutes.   Signed: Mikala Podoll 08/16/2011 2:09 PM

## 2011-08-16 NOTE — Discharge Instructions (Signed)
Peripheral Vascular Disease Peripheral Vascular Disease (PVD), also called Peripheral Arterial Disease (PAD), is a circulation problem caused by cholesterol (atherosclerotic plaque) deposits in the arteries. PVD commonly occurs in the lower extremities (legs) but it can occur in other areas of the body, such as your arms. The cholesterol buildup in the arteries reduces blood flow which can cause pain and other serious problems. The presence of PVD can place a person at risk for Coronary Artery Disease (CAD).  CAUSES  Causes of PVD can be many. It is usually associated with more than one risk factor such as:   High Cholesterol.   Smoking.   Diabetes.   Lack of exercise or inactivity.   High blood pressure (hypertension).   Obesity.   Family history.  SYMPTOMS   When the lower extremities are affected, patients with PVD may experience:   Leg pain with exertion or physical activity. This is called INTERMITTENT CLAUDICATION. This may present as cramping or numbness with physical activity. The location of the pain is associated with the level of blockage. For example, blockage at the abdominal level (distal abdominal aorta) may result in buttock or hip pain. Lower leg arterial blockage may result in calf pain.   As PVD becomes more severe, pain can develop with less physical activity.   In people with severe PVD, leg pain may occur at rest.   Other PVD signs and symptoms:   Leg numbness or weakness.   Coldness in the affected leg or foot, especially when compared to the other leg.   A change in leg color.   Patients with significant PVD are more prone to ulcers or sores on toes, feet or legs. These may take longer to heal or may reoccur. The ulcers or sores can become infected.   If signs and symptoms of PVD are ignored, gangrene may occur. This can result in the loss of toes or loss of an entire limb.   Not all leg pain is related to PVD. Other medical conditions can cause  leg pain such as:   Blood clots (embolism) or Deep Vein Thrombosis.   Inflammation of the blood vessels (vasculitis).   Spinal stenosis.  DIAGNOSIS  Diagnosis of PVD can involve several different types of tests. These can include:  Pulse Volume Recording Method (PVR). This test is simple, painless and does not involve the use of X-rays. PVR involves measuring and comparing the blood pressure in the arms and legs. An ABI (Ankle-Brachial Index) is calculated. The normal ratio of blood pressures is 1. As this number becomes smaller, it indicates more severe disease.   < 0.95 - indicates significant narrowing in one or more leg vessels.   <0.8 - there will usually be pain in the foot, leg or buttock with exercise.   <0.4 - will usually have pain in the legs at rest.   <0.25 - usually indicates limb threatening PVD.   Doppler detection of pulses in the legs. This test is painless and checks to see if you have a pulses in your legs/feet.   A dye or contrast material (a substance that highlights the blood vessels so they show up on x-ray) may be given to help your caregiver better see the arteries for the following tests. The dye is eliminated from your body by the kidney's. Your caregiver may order blood work to check your kidney function and other laboratory values before the following tests are performed:   Magnetic Resonance Angiography (MRA). An MRA  is a picture study of the blood vessels and arteries. The MRA machine uses a large magnet to produce images of the blood vessels.   Computed Tomography Angiography (CTA). A CTA is a specialized x-ray that looks at how the blood flows in your blood vessels. An IV may be inserted into your arm so contrast dye can be injected.   Angiogram. Is a procedure that uses x-rays to look at your blood vessels. This procedure is minimally invasive, meaning a small incision (cut) is made in your groin. A small tube (catheter) is then inserted into the artery  of your groin. The catheter is guided to the blood vessel or artery your caregiver wants to examine. Contrast dye is injected into the catheter. X-rays are then taken of the blood vessel or artery. After the images are obtained, the catheter is taken out.  TREATMENT  Treatment of PVD involves many interventions which may include:  Lifestyle changes:   Quitting smoking.   Exercise.   Following a low fat, low cholesterol diet.   Control of diabetes.   Foot care is very important to the PVD patient. Good foot care can help prevent infection.   Medication:   Cholesterol-lowering medicine.   Blood pressure medicine.   Anti-platelet drugs.   Certain medicines may reduce symptoms of Intermittent Claudication.   Interventional/Surgical options:   Angioplasty. An Angioplasty is a procedure that inflates a balloon in the blocked artery. This opens the blocked artery to improve blood flow.   Stent Implant. A wire mesh tube (stent) is placed in the artery. The stent expands and stays in place, allowing the artery to remain open.   Peripheral Bypass Surgery. This is a surgical procedure that reroutes the blood around a blocked artery to help improve blood flow. This type of procedure may be performed if Angioplasty or stent implants are not an option.  SEEK IMMEDIATE MEDICAL CARE IF:   You develop pain or numbness in your arms or legs.   Your arm or leg turns cold, becomes blue in color.   You develop redness, warmth, swelling and pain in your arms or legs.  MAKE SURE YOU:   Understand these instructions.   Will watch your condition.   Will get help right away if you are not doing well or get worse.  Document Released: 07/05/2004 Document Revised: 05/17/2011 Document Reviewed: 06/01/2008 [facility] Patient Information 2012 [place].  Diabetes and Foot Care Diabetes may cause you to have a poor blood supply (circulation) to your legs and feet. Because of this, the skin may  be thinner, break easier, and heal more slowly. You also may have nerve damage in your legs and feet causing decreased feeling. You may not notice minor injuries to your feet that could lead to serious problems or infections. Taking care of your feet is one of the most important things you can do for yourself.  HOME CARE INSTRUCTIONS  Do not go barefoot. Bare feet are easily injured.   Check your feet daily for blisters, cuts, and redness.   Wash your feet with warm water (not hot) and mild soap. Pat your feet and between your toes until completely dry.   Apply a moisturizing lotion that does not contain alcohol or petroleum jelly to the dry skin on your feet and to dry brittle toenails. Do not put it between your toes.   Trim your toenails straight across. Do not dig under them or around the cuticle.   Do not cut corns  or calluses, or try to remove them with medicine.   Wear clean cotton socks or stockings every day. Make sure they are not too tight. Do not wear knee high stockings since they may decrease blood flow to your legs.   Wear leather shoes that fit properly and have enough cushioning. To break in new shoes, wear them just a few hours a day to avoid injuring your feet.   Wear shoes at all times, even in the house.   Do not cross your legs. This may decrease the blood flow to your feet.   If you find a minor scrape, cut, or break in the skin on your feet, keep it and the skin around it clean and dry. These areas may be cleansed with mild soap and water. Do not use peroxide, alcohol, iodine or Merthiolate.   When you remove an adhesive bandage, be sure not to harm the skin around it.   If you have a wound, look at it several times a day to make sure it is healing.   Do not use heating pads or hot water bottles. Burns can occur. If you have lost feeling in your feet or legs, you may not know it is happening until it is too late.   Report any cuts, sores or bruises to your  caregiver. Do not wait!  SEEK MEDICAL CARE IF:   You have an injury that is not healing or you notice redness, numbness, burning, or tingling.   Your feet always feel cold.   You have pain or cramps in your legs and feet.  SEEK IMMEDIATE MEDICAL CARE IF:   There is increasing redness, swelling, or increasing pain in the wound.   There is a red line that goes up your leg.   Pus is coming from a wound.   You develop an unexplained oral temperature above 102 F (38.9 C), or as your caregiver suggests.   You notice a bad smell coming from an ulcer or wound.  MAKE SURE YOU:   Understand these instructions.   Will watch your condition.   Will get help right away if you are not doing well or get worse.  Document Released: 05/25/2000 Document Revised: 05/17/2011 Document Reviewed: 12/01/2008 [facility] Patient Information 2012 [place].  Warfarin  Warfarin is a blood thinner (anticoagulant) medicine. It is used to keep clots from forming in your blood. When you take warfarin, you may bruise easily. You may also bleed a little longer if you cut yourself.  Before taking warfarin, tell your doctor if:  You take any other medicine for your heart or blood pressure.   You are pregnant.   You plan to get pregnant.   You are breastfeeding.   You are allergic to any medicine.  HOME CARE  Take warfarin as told by your doctor. Do not stop the medicine unless your doctor tells you to.   Take your medicine at the same time every day.   Do not take anything that has aspirin in it unless your doctor says it is okay.   Do not drink alcohol.   Tell all your doctors and dentists that you are taking warfarin before they treat you or give you any medicines.   Keep all your appointments for doctor visits and blood tests.   Keep warfarin out of reach of children. Do not share warfarin with anyone.   Eat about the same amount of vitamin K foods every day.   High vitamin K foods:  Beef liver. Pork liver. Green tea. Broccoli. Brussels sprouts. Cauliflower. Chickpeas. Kale. Spinach. Turnip greens.   Medium vitamin K foods: Chicken liver. Pork tenderloin. Cheddar cheese. Rolled oats. Coffee. Asparagus. Cabbage. Iceberg lettuce.   Low vitamin K foods: Apples. Butter. Bananas. Skim or 1% milk. Orange rice. Canned pears. White bread. Strawberries. Corn. Tomatoes. Green beans. Eggs. Potatoes. Berniece Salines. Pumpkin. Chicken breasts. Ground beef. Oil (except soybean oil).  GET HELP RIGHT AWAY IF:  You miss a dose of warfarin. Do not take 2 doses at the same time.   You have a skin rash.   You have heavy or unusual bleeding.   There is blood in your pee (urine) or poop (stool).   You have side effects from medicine that do not get better after a few days.  MAKE SURE YOU:  Understand these instructions.   Will watch your condition.   Will get help right away if you are not doing well or get worse.  Document Released: 06/30/2010 Document Revised: 05/17/2011 Document Reviewed: 06/30/2010  Enoxaparin, Home Use Enoxaparin (Lovenox) injection is a medication used to prevent clots from developing in your veins. Medications such as enoxaparin are called blood thinners or anticoagulants. If blood clots are untreated they could travel to your lungs. This is called a pulmonary embolus. A blood clot in your lungs can be fatal. Caregivers often use anticoagulants such as enoxaparin to prevent clots following surgery. It is also used along with aspirin when the heart is not getting enough blood. Usually another anticoagulant called warfarin (Coumadin) is started in 2 to 3 days after the enoxaparin is started. The enoxaparin is usually continued until the other anticoagulants have begun to work. Your caregiver will judge this length of time by measuring the length of time that it takes your blood to clot. RISKS AND COMPLICATIONS  If you have received recent epidural anesthesia, spinal  anesthesia, or a spinal tap while receiving anticoagulants, you are at risk for developing a blood clot in or around the spine. This condition could result in long-term or permanent paralysis.   Because anticoagulants thin your blood, severe bleeding may occur from any tissue or organ. Symptoms of the blood being too thin may include:   Bleeding from the nose or gums that does not stop quickly.   Unusual bruising or bruising easily.   Swelling or pain at an injection site.   A cut that does not stop bleeding within 10 minutes.   Continual nausea for more than 1 day or vomiting blood.   Coughing up blood.   Blood in the urine which may appear as pink, red, or brown urine.   Blood in bowel movements which may appear as red, dark or black stools.   Sudden weakness or numbness of the face, arm, or leg, especially on one side of the body.   Sudden confusion.   Trouble speaking (aphasia) or understanding.   Sudden trouble seeing in one or both eyes.   Sudden trouble walking.   Dizziness.   Loss of balance or coordination.   Severe pain, such as a headache, joint pain, or back pain.   Fever.   Bruising around the injection sites may be expected.   Platelet drops, known as "thrombocytopenia," can occur with enoxaparin use. A condition called "heparin-induced thrombocytopenia" has been seen. If you have had this condition, you should tell your caregiver. Your caregiver may direct you to have blood tests to monitor this condition.   Do not use if you have  allergies to the medication, heparin, or pork products.   Other side effects may include mild local reactions or irritation at the site of injection, pain, bruising, and redness of skin.  HOME CARE INSTRUCTIONS You will be instructed by your caregiver how to give enoxaparin injections. 1. Before giving your medication you should make sure the injection is a clear and colorless or pale yellow solution. If your medication becomes  discolored or has particles in the bottle, do not use and notify your caregiver.  2. When using the 30 and 40 mg pre-filled syringes, do not expel the air bubble from the syringe before the injection. This makes sure you use all the medication in the syringe.  3. The injections will be given subcutaneously. This means it is given into the fat over the belly (abdomen). It is given deep beneath the skin but not into the muscle. The shots should be injected around the abdominal wall. Change the sites of injection each time. The whole length of the needle should be introduced into a skin fold held between the thumb and forefinger; the skin fold should be held throughout the injection. Do not rub the injection site after completion of the injection. This increases bruising. Enoxaparin injection pre-filled syringes and graduated pre-filled syringes are available with a system that shields the needle after injection.  4. Inject by pushing the plunger to the bottom of the syringe.  5. Remove the syringe from the injection site keeping your finger on the plunger rod. Be careful not to stick yourself or others.  6. After injection and the syringe is empty, set off the safety system by firmly pushing the plunger rod. The protective sleeve will automatically cover the needle and you can hear a click. The click means your needle is safely covered. Do not try replacing the needle shield.  7. Get rid of the syringe in the nearest sharps container.  8. Keep your medication safely stored at room temperatures.   Due to the complications of anticoagulants, it is very important that you take your anticoagulant as directed by your caregiver. Anticoagulants need to be taken exactly as instructed. Be sure you understand all your anticoagulant instructions.   Changes in medicines, supplements, diet, and illness can affect your anticoagulation therapy. Be sure to inform your caregivers of any of these changes.   While on  anticoagulants, you will need to have blood tests done routinely as directed by your caregivers.   Be careful not to cut yourself when using sharp objects.   Avoid heavy or variable alcohol use. Consume alcohol only in very limited quantities. General alcohol intake guidelines are 1 drink for nonpregnant women and 2 drinks for men per day. (1 drink = 5 ounces of wine, 12 ounces of beer, or 1 ounces of liquor). A sudden increase in alcohol use can increase your risk of bleeding. Chronic alcohol use can cause warfarin to be less effective.   Limit physical activities or sports that could result in a fall or cause injury.   It is extremely important that you tell all of your caregivers and dentist that you are taking an anticoagulant, especially if you are injured or plan to have any type of procedure or operation.   Follow up with your laboratory test and caregiver appointments as directed. It is very important to keep your appointments. Not keeping appointments could result in a chronic or permanent injury, pain, or disability.  SEEK MEDICAL CARE IF:  You develop any rashes.  You have any worsening of the condition for which you are receiving anticoagulation therapy.  SEEK IMMEDIATE MEDICAL CARE IF:  Bleeding from the nose or gums does not stop quickly.   You have unusual bruising or are bruising easily.   Swelling or pain occurs at an injection site.   A cut does not stop bleeding within 10 minutes.   You have continual nausea for more than 1 day or are vomiting blood.   You are coughing up blood.   You have blood in the urine.   You have dark or black stools.   You have sudden weakness or numbness of the face, arm, or leg, especially on one side of the body.   You have sudden confusion.   You have trouble speaking (aphasia) or understanding.   You have sudden trouble seeing in one or both eyes.   You have sudden trouble walking.   You have dizziness.   You have a loss  of balance or coordination.   You have severe pain, such as a headache, joint pain, or back pain.   You have a serious fall or head injury, even if you are not bleeding.   You have an oral temperature above 102 F (38.9 C), not controlled by medicine.  ANY OF THESE SYMPTOMS MAY REPRESENT A SERIOUS PROBLEM THAT IS AN EMERGENCY. Do not wait to see if the symptoms will go away. Get medical help right away. Call your local emergency services (911 in [place] DO NOT drive yourself to the hospital. MAKE SURE YOU:  Understand these instructions.   Will watch your condition.   Will get help right away if you are not doing well or get worse.  Document Released: 03/29/2004 Document Revised: 05/17/2011 Document Reviewed: 05/28/2005 [facility] Patient Information 2012 [place]. [employer] Patient Information 2012 [place].   Please elevate Heels at all times [facility] TIMES PT Consult Recommended at [place] INR IS (2-3) THERAPEUTIC CHECK BLOOD SUGARS AT LEAST 3 TIMES PER DAY AND PRN

## 2011-08-17 ENCOUNTER — Ambulatory Visit (INDEPENDENT_AMBULATORY_CARE_PROVIDER_SITE_OTHER): Payer: Medicaid Other | Admitting: Vascular Surgery

## 2011-08-17 ENCOUNTER — Encounter: Payer: Self-pay | Admitting: Vascular Surgery

## 2011-08-17 VITALS — BP 144/94 | HR 103 | Resp 16 | Ht 67.0 in | Wt 231.0 lb

## 2011-08-17 DIAGNOSIS — I998 Other disorder of circulatory system: Secondary | ICD-10-CM

## 2011-08-17 DIAGNOSIS — I999 Unspecified disorder of circulatory system: Secondary | ICD-10-CM

## 2011-08-17 DIAGNOSIS — I779 Disorder of arteries and arterioles, unspecified: Secondary | ICD-10-CM | POA: Insufficient documentation

## 2011-08-17 NOTE — Progress Notes (Signed)
[facility] [place]  Established Critical Limb Ischemia Patient  History of Present Illness  [name] is a 51 y.o. ([birth year]) female who presents with chief complaint: left leg wounds.  The patient has no rest pain and wounds include: L great toe and left lateral mallelous.  The patient notes symptoms have not progressed.  The patient's treatment regimen currently included: maximal medical management and wound care.  Pt recommended to have an L BKA by Dr. Sharol Given, but they wanted a second opinion.  Family notes she has recovered some function in her left hand leg.  She is continuing with rehab.  Past Medical History, Past Surgical History, Social History, Family History, Medications, Allergies, and Review of Systems are unchanged from previous evaluation on 06/01/11.  Physical Examination  Filed Vitals:   08/17/11 0946  BP: 144/94  Pulse: 103  Resp: 16  Height: 5\' 7"  (1.702 m)  Weight: 231 lb (104.781 kg)  SpO2: 100%   Body mass index is 36.18 kg/(m^2).  General: A&O x 3, WDWN, obese  Eyes: PERRLA, EOMI  Pulmonary: Sym exp, good air movt, CTAB, no rales, rhonchi, & wheezing  Cardiac: RRR, Nl S1, S2, no Murmurs, rubs or gallops  Vascular: Vessel Right Left  Radial Palpable Palpable  Brachial Palpable Palpable  Carotid Palpable, without bruit Palpable, without bruit  Aorta Non-palpable N/A  Femoral Palpable Palpable  Popliteal Non-palpable Non-palpable  PT Non-Palpable Non-Palpable  DP Non-Palpable Non-Palpable   Gastrointestinal: soft, NTND, -G/R, - HSM, - masses, - CVAT B  Musculoskeletal: M/S 5/5 R side, 1-2/5 L side: able to flex partial L knee, Extremities without ischemic changes except left great toe ulcer at distal phalange and lateral malleolus ulcer: underlying subcutaneously tissue evident  Neurologic: CN 2-12 intact , Pain and light touch intact in extremities , Motor exam as listed above  Medical Decision Making  [name] is  a 51 y.o. 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  I recommend L BKA with Dr. Sharol Given, but family would like an attempt at local wound care and antibiotics suppression.  I have referred the patient to [facility] wound care clinic for management of the wounds.  I referred the patient back to their primary care physician for long-term antibiotic suppression of possible osteomyelitis in the left foot.  The patient will follow up in one month for re-evaluation.  I discussed in depth with the patient the nature of atherosclerosis, and emphasized the importance of maximal medical management including strict control of blood pressure, blood glucose, and lipid levels, antiplatelet agents, obtaining regular exercise, and cessation of smoking.  The patient is aware that without maximal medical management the underlying atherosclerotic disease process will progress, limiting the benefit of any interventions.  Thank you for allowing [place] to participate in this patient's care.  Adele Barthel, MD [facility] [place] Office: [phone] Pager: [phone]  08/17/2011, 12:39 PM

## 2011-08-21 ENCOUNTER — Encounter (HOSPITAL_BASED_OUTPATIENT_CLINIC_OR_DEPARTMENT_OTHER): Payer: Medicaid Other | Attending: General Surgery

## 2011-08-21 DIAGNOSIS — I739 Peripheral vascular disease, unspecified: Secondary | ICD-10-CM | POA: Insufficient documentation

## 2011-08-21 DIAGNOSIS — R5381 Other malaise: Secondary | ICD-10-CM | POA: Insufficient documentation

## 2011-08-21 DIAGNOSIS — K219 Gastro-esophageal reflux disease without esophagitis: Secondary | ICD-10-CM | POA: Insufficient documentation

## 2011-08-21 DIAGNOSIS — L8990 Pressure ulcer of unspecified site, unspecified stage: Secondary | ICD-10-CM | POA: Insufficient documentation

## 2011-08-21 DIAGNOSIS — L97509 Non-pressure chronic ulcer of other part of unspecified foot with unspecified severity: Secondary | ICD-10-CM | POA: Insufficient documentation

## 2011-08-21 DIAGNOSIS — Z79899 Other long term (current) drug therapy: Secondary | ICD-10-CM | POA: Insufficient documentation

## 2011-08-21 DIAGNOSIS — E1169 Type 2 diabetes mellitus with other specified complication: Secondary | ICD-10-CM | POA: Insufficient documentation

## 2011-08-21 DIAGNOSIS — I69998 Other sequelae following unspecified cerebrovascular disease: Secondary | ICD-10-CM | POA: Insufficient documentation

## 2011-08-21 DIAGNOSIS — R5383 Other fatigue: Secondary | ICD-10-CM | POA: Insufficient documentation

## 2011-08-21 DIAGNOSIS — G609 Hereditary and idiopathic neuropathy, unspecified: Secondary | ICD-10-CM | POA: Insufficient documentation

## 2011-08-21 DIAGNOSIS — E785 Hyperlipidemia, unspecified: Secondary | ICD-10-CM | POA: Insufficient documentation

## 2011-08-21 DIAGNOSIS — Z7901 Long term (current) use of anticoagulants: Secondary | ICD-10-CM | POA: Insufficient documentation

## 2011-09-06 NOTE — Progress Notes (Signed)
[facility]  NAME:  [name], QUEENER NO.:  MEDICAL RECORD NO.:  [id number]      DATE OF BIRTH:  06-26-1960  PHYSICIAN:  Theodoro Kos, DO       VISIT DATE:  09/05/2011                                  OFFICE VISIT   The patient is a 51 year old black female, who is here with her sister for evaluation of her left lower extremity diabetic __________ foot ulcers.  She has undergone treatment here for several weeks and has included Santyl and Hydrogel with little-to-no improvement.  She has been advised on the necessity for amputation and has seen the vascular surgeon and Dr. Sharol Given.  Vascular studies were done, it showed that bilateral ABIs were unable to be obtained due to noncompressible vessels, and the left TBI was abnormal.  The patient seems to realize the situation at this point.  Her past medical history is positive for cerebral vascular accident recently which rendered her weak and in a nursing facility.  She has pressure ulcers, muscle spasm, depressive disorder, anxiety, gastroesophageal reflux with esophagitis, peripheral neuropathy, anemia, hyperlipidemia, diabetes, and a history of osteomyelitis.  MEDICATIONS:  Include Celexa, Amaryl, warfarin, Tylenol, Robaxin, Biotin, Cozaar, iron, Lasix, metoprolol, Neurontin, Restasis, Ritalin, simvastatin, Maalox.  ALLERGIES:  She is allergic to VANCOMYCIN and LISINOPRIL.  EXAM:  GENERAL: She is alert, oriented.  She seems to be appropriate and a good historian.  Her sister is very helpful as well.  HEENT: Her pupils are equal.  Extraocular muscles are intact. EXTREMITIES: She has some contracture of her upper extremity with decreased movement.  Pulses are strong and regular.  ABDOMEN:  Soft and nontender.  Her breathing is unlabored. CARDIAC: Heart is regular.  I am not able to feel pulses of her lower extremities. SKIN:  The skin is dry.  She has necrotic wound on the lateral aspect  of her foot.  Her left small toe and her great toe, with little capillary refill as well.  ASSESSMENT:  Left lower extremity diabetic foot ulcers and peripheral vascular disease.  Recommend amputation and we will send her back for Dr. Sharol Given to re- evaluate.  In the meantime, she can continue with triple antibiotic ointment, and she will notify us if she has any trouble healing the amputation site.  We had a long discussion about also starting vitamin C, zinc, and multivitamin and improving her protein, and she seems to be ready for the amputation at this point.     Theodoro Kos, DO    CS/MEDQ  D:  09/05/2011  T:  09/05/2011  Job:  [id number]

## 2011-09-12 ENCOUNTER — Other Ambulatory Visit (HOSPITAL_COMMUNITY): Payer: Self-pay | Admitting: Orthopedic Surgery

## 2011-09-13 ENCOUNTER — Encounter (HOSPITAL_COMMUNITY): Payer: Self-pay

## 2011-09-13 ENCOUNTER — Encounter (HOSPITAL_COMMUNITY): Payer: Self-pay | Admitting: Pharmacy Technician

## 2011-09-13 ENCOUNTER — Encounter (HOSPITAL_COMMUNITY)
Admission: RE | Admit: 2011-09-13 | Discharge: 2011-09-13 | Disposition: A | Discharge status: Home / Self Care | Payer: Medicaid Other | Source: Ambulatory Visit | Attending: Orthopedic Surgery | Admitting: Orthopedic Surgery

## 2011-09-13 ENCOUNTER — Other Ambulatory Visit: Payer: Self-pay

## 2011-09-13 DIAGNOSIS — I517 Cardiomegaly: Secondary | ICD-10-CM | POA: Insufficient documentation

## 2011-09-13 DIAGNOSIS — Z01818 Encounter for other preprocedural examination: Secondary | ICD-10-CM | POA: Insufficient documentation

## 2011-09-13 HISTORY — DX: Shortness of breath: R06.02

## 2011-09-13 LAB — COMPREHENSIVE METABOLIC PANEL
ALT: 21 U/L (ref 0–35)
AST: 31 U/L (ref 0–37)
Albumin: 2.4 g/dL — ABNORMAL LOW (ref 3.5–5.2)
Alkaline Phosphatase: 133 U/L — ABNORMAL HIGH (ref 39–117)
Calcium: 9.2 mg/dL (ref 8.4–10.5)
Glucose, Bld: 89 mg/dL (ref 70–99)
Potassium: 4.6 mEq/L (ref 3.5–5.1)
Sodium: 138 mEq/L (ref 135–145)
Total Protein: 6.8 g/dL (ref 6.0–8.3)

## 2011-09-13 LAB — CBC
Hemoglobin: 12.7 g/dL (ref 12.0–15.0)
MCHC: 32.7 g/dL (ref 30.0–36.0)
Platelets: 448 10*3/uL — ABNORMAL HIGH (ref 150–400)
RDW: 15.6 % — ABNORMAL HIGH (ref 11.5–15.5)

## 2011-09-13 LAB — APTT: aPTT: 117 seconds — ABNORMAL HIGH (ref 24–37)

## 2011-09-13 LAB — SURGICAL PCR SCREEN
MRSA, PCR: NEGATIVE
Staphylococcus aureus: NEGATIVE

## 2011-09-13 MED ORDER — CHLORHEXIDINE GLUCONATE 4 % EX LIQD: 60.0000 mL | Freq: Once | CUTANEOUS | Status: DC

## 2011-09-13 MED ORDER — CEFAZOLIN SODIUM 1-5 GM-% IV SOLN: 1.0000 g | INTRAVENOUS | Status: DC

## 2011-09-13 NOTE — Pre-Procedure Instructions (Signed)
[place]  09/13/2011   Your procedure is scheduled on:  09/14/11  Report to [facility] at 0930 AM.  Call this number if you have problems the morning of surgery: [phone]   Remember:   Do not eat food:After Midnight.  May have clear liquids: up to 4 Hours before arrival.no drinks after 0530am   Clear liquids include soda, tea, black coffee, apple or grape juice, broth.  Take these medicines the morning of surgery with A SIP OF WATER: xanax  Metoprolol  Protonix     Do not wear jewelry, make-up or nail polish.  Do not wear lotions, powders, or perfumes. You may wear deodorant.  Do not shave 48 hours prior to surgery.  Do not bring valuables to the hospital.  Contacts, dentures or bridgework may not be worn into surgery.  Leave suitcase in the car. After surgery it may be brought to your room.  For patients admitted to the hospital, checkout time is 11:00 AM the day of discharge.   Patients discharged the day of surgery will not be allowed to drive home.  Name and phone number of your driver: Mackie Pai --sister   [phone]  Special Instructions: CHG Shower Use Special Wash: 1/2 bottle night before surgery and 1/2 bottle morning of surgery.   Please read over the following fact sheets that you were given: Pain Booklet, Coughing and Deep Breathing, MRSA Information and Surgical Site Infection Prevention

## 2011-09-13 NOTE — Progress Notes (Signed)
DR Tamala Julian REVIEWED EKG&HEALTH HX.

## 2011-09-14 ENCOUNTER — Encounter (HOSPITAL_COMMUNITY): Payer: Self-pay | Admitting: *Deleted

## 2011-09-14 ENCOUNTER — Encounter (HOSPITAL_COMMUNITY)
Admission: RE | Disposition: A | Discharge status: Skilled Nursing Facility | Payer: Self-pay | Source: Ambulatory Visit | Attending: Orthopedic Surgery

## 2011-09-14 ENCOUNTER — Encounter (HOSPITAL_COMMUNITY): Payer: Self-pay | Admitting: Certified Registered"

## 2011-09-14 ENCOUNTER — Ambulatory Visit (HOSPITAL_COMMUNITY)
Admission: RE | Admit: 2011-09-14 | Discharge: 2011-09-14 | Disposition: A | Discharge status: Skilled Nursing Facility | Payer: Medicaid Other | Source: Ambulatory Visit | Attending: Orthopedic Surgery | Admitting: Orthopedic Surgery

## 2011-09-14 ENCOUNTER — Emergency Department (HOSPITAL_COMMUNITY)
Admission: EM | Admit: 2011-09-14 | Discharge: 2011-09-14 | Disposition: A | Discharge status: Home / Self Care | Payer: Medicaid Other | Attending: Emergency Medicine | Admitting: Emergency Medicine

## 2011-09-14 DIAGNOSIS — K219 Gastro-esophageal reflux disease without esophagitis: Secondary | ICD-10-CM | POA: Insufficient documentation

## 2011-09-14 DIAGNOSIS — E669 Obesity, unspecified: Secondary | ICD-10-CM | POA: Insufficient documentation

## 2011-09-14 DIAGNOSIS — N390 Urinary tract infection, site not specified: Secondary | ICD-10-CM

## 2011-09-14 DIAGNOSIS — D689 Coagulation defect, unspecified: Secondary | ICD-10-CM | POA: Insufficient documentation

## 2011-09-14 DIAGNOSIS — Z794 Long term (current) use of insulin: Secondary | ICD-10-CM | POA: Insufficient documentation

## 2011-09-14 DIAGNOSIS — E119 Type 2 diabetes mellitus without complications: Secondary | ICD-10-CM | POA: Insufficient documentation

## 2011-09-14 DIAGNOSIS — I10 Essential (primary) hypertension: Secondary | ICD-10-CM | POA: Insufficient documentation

## 2011-09-14 DIAGNOSIS — D6832 Hemorrhagic disorder due to extrinsic circulating anticoagulants: Secondary | ICD-10-CM

## 2011-09-14 DIAGNOSIS — E785 Hyperlipidemia, unspecified: Secondary | ICD-10-CM | POA: Insufficient documentation

## 2011-09-14 DIAGNOSIS — R110 Nausea: Secondary | ICD-10-CM

## 2011-09-14 DIAGNOSIS — T45515A Adverse effect of anticoagulants, initial encounter: Secondary | ICD-10-CM | POA: Insufficient documentation

## 2011-09-14 DIAGNOSIS — Z8679 Personal history of other diseases of the circulatory system: Secondary | ICD-10-CM | POA: Insufficient documentation

## 2011-09-14 LAB — CBC
HCT: 36.8 % (ref 36.0–46.0)
Hemoglobin: 12 g/dL (ref 12.0–15.0)
MCH: 28.6 pg (ref 26.0–34.0)
MCHC: 32.6 g/dL (ref 30.0–36.0)
MCV: 87.6 fL (ref 78.0–100.0)
Platelets: 413 K/uL — ABNORMAL HIGH (ref 150–400)
RBC: 4.2 MIL/uL (ref 3.87–5.11)
RDW: 15.9 % — ABNORMAL HIGH (ref 11.5–15.5)
WBC: 10.1 10*3/uL (ref 4.0–10.5)

## 2011-09-14 LAB — URINALYSIS, ROUTINE W REFLEX MICROSCOPIC
Bilirubin Urine: NEGATIVE
Glucose, UA: NEGATIVE mg/dL
Ketones, ur: NEGATIVE mg/dL
Nitrite: NEGATIVE
Protein, ur: >300 mg/dL — AB
Specific Gravity, Urine: 1.012 (ref 1.005–1.030)
Urobilinogen, UA: 0.2 mg/dL (ref 0.0–1.0)
pH: 6.5 (ref 5.0–8.0)

## 2011-09-14 LAB — URINE MICROSCOPIC-ADD ON

## 2011-09-14 LAB — BASIC METABOLIC PANEL
CO2: 26 mEq/L (ref 19–32)
Calcium: 8.9 mg/dL (ref 8.4–10.5)
GFR calc non Af Amer: 68 mL/min — ABNORMAL LOW (ref >90)
Glucose, Bld: 112 mg/dL — ABNORMAL HIGH (ref 70–99)
Potassium: 4.5 mEq/L (ref 3.5–5.1)
Sodium: 141 mEq/L (ref 135–145)

## 2011-09-14 LAB — PROTIME-INR
INR: 4.75 — ABNORMAL HIGH (ref 0.00–1.49)
Prothrombin Time: 45.3 s — ABNORMAL HIGH (ref 11.6–15.2)

## 2011-09-14 LAB — BASIC METABOLIC PANEL WITH GFR
BUN: 24 mg/dL — ABNORMAL HIGH (ref 6–23)
Chloride: 107 meq/L (ref 96–112)
Creatinine, Ser: 0.97 mg/dL (ref 0.50–1.10)
GFR calc Af Amer: 79 mL/min — ABNORMAL LOW (ref >90)

## 2011-09-14 LAB — OCCULT BLOOD, POC DEVICE: Fecal Occult Bld: NEGATIVE

## 2011-09-14 SURGERY — AMPUTATION BELOW KNEE
Anesthesia: General | Site: Leg Lower | Laterality: Left

## 2011-09-14 MED ORDER — ONDANSETRON 8 MG PO TBDP: 8.0000 mg | ORAL_TABLET | Freq: Two times a day (BID) | ORAL | Status: AC | PRN

## 2011-09-14 MED ORDER — CEPHALEXIN 500 MG PO CAPS: 500.0000 mg | ORAL_CAPSULE | Freq: Three times a day (TID) | ORAL | Status: DC

## 2011-09-14 SURGICAL SUPPLY — 44 items
BANDAGE ESMARK 6X9 LF (GAUZE/BANDAGES/DRESSINGS) ×1 IMPLANT
BANDAGE GAUZE ELAST BULKY 4 IN (GAUZE/BANDAGES/DRESSINGS) ×4 IMPLANT
BLADE SAW RECIP 87.9 MT (BLADE) ×2 IMPLANT
BLADE SURG 21 STRL SS (BLADE) ×2 IMPLANT
BNDG COHESIVE 6X5 TAN STRL LF (GAUZE/BANDAGES/DRESSINGS) ×4 IMPLANT
BNDG ESMARK 6X9 LF (GAUZE/BANDAGES/DRESSINGS) ×2
CLOTH BEACON ORANGE TIMEOUT ST (SAFETY) ×2 IMPLANT
COVER SURGICAL LIGHT HANDLE (MISCELLANEOUS) ×2 IMPLANT
CUFF TOURNIQUET SINGLE 34IN LL (TOURNIQUET CUFF) IMPLANT
CUFF TOURNIQUET SINGLE 44IN (TOURNIQUET CUFF) IMPLANT
DRAIN PENROSE 1/2X12 LTX STRL (WOUND CARE) IMPLANT
DRAPE EXTREMITY T 121X128X90 (DRAPE) ×2 IMPLANT
DRAPE PROXIMA HALF (DRAPES) ×4 IMPLANT
DRAPE U-SHAPE 47X51 STRL (DRAPES) ×4 IMPLANT
DRSG ADAPTIC 3X8 NADH LF (GAUZE/BANDAGES/DRESSINGS) ×2 IMPLANT
DRSG PAD ABDOMINAL 8X10 ST (GAUZE/BANDAGES/DRESSINGS) ×2 IMPLANT
DURAPREP 26ML APPLICATOR (WOUND CARE) ×2 IMPLANT
ELECT REM PT RETURN 9FT ADLT (ELECTROSURGICAL) ×2
ELECTRODE REM PT RTRN 9FT ADLT (ELECTROSURGICAL) ×1 IMPLANT
EVACUATOR 1/8 PVC DRAIN (DRAIN) IMPLANT
GLOVE BIOGEL PI IND STRL 9 (GLOVE) ×1 IMPLANT
GLOVE BIOGEL PI INDICATOR 9 (GLOVE) ×1
GLOVE SURG ORTHO 9.0 STRL STRW (GLOVE) ×2 IMPLANT
GOWN PREVENTION PLUS XLARGE (GOWN DISPOSABLE) ×2 IMPLANT
GOWN SRG XL XLNG 56XLVL 4 (GOWN DISPOSABLE) ×1 IMPLANT
GOWN STRL NON-REIN XL XLG LVL4 (GOWN DISPOSABLE) ×1
KIT BASIN OR (CUSTOM PROCEDURE TRAY) ×2 IMPLANT
KIT ROOM TURNOVER OR (KITS) ×2 IMPLANT
MANIFOLD NEPTUNE II (INSTRUMENTS) ×2 IMPLANT
NS IRRIG 1000ML POUR BTL (IV SOLUTION) ×2 IMPLANT
PACK GENERAL/GYN (CUSTOM PROCEDURE TRAY) ×2 IMPLANT
PAD ARMBOARD 7.5X6 YLW CONV (MISCELLANEOUS) ×4 IMPLANT
SPONGE GAUZE 4X4 12PLY (GAUZE/BANDAGES/DRESSINGS) ×2 IMPLANT
SPONGE LAP 18X18 X RAY DECT (DISPOSABLE) IMPLANT
STAPLER VISISTAT 35W (STAPLE) IMPLANT
STOCKINETTE IMPERVIOUS LG (DRAPES) ×2 IMPLANT
SUT PDS AB 1 CT  36 (SUTURE)
SUT PDS AB 1 CT 36 (SUTURE) IMPLANT
SUT SILK 2 0 (SUTURE) ×1
SUT SILK 2-0 18XBRD TIE 12 (SUTURE) ×1 IMPLANT
TOWEL OR 17X24 6PK STRL BLUE (TOWEL DISPOSABLE) ×2 IMPLANT
TOWEL OR 17X26 10 PK STRL BLUE (TOWEL DISPOSABLE) ×2 IMPLANT
TUBE ANAEROBIC SPECIMEN COL (MISCELLANEOUS) IMPLANT
WATER STERILE IRR 1000ML POUR (IV SOLUTION) ×2 IMPLANT

## 2011-09-14 NOTE — Progress Notes (Addendum)
Dr.Duda notified of PT/INR/PTT-ok to proceed with planned procedure-  Notified that [facility] had given Coumadin up until 09/11/11

## 2011-09-14 NOTE — ED Notes (Signed)
[facility] could not take wheelchair for patient.  RN took wheelchair to Immunologist.

## 2011-09-14 NOTE — Discharge Instructions (Signed)
Coagulopathy Coagulopathy (bleeding disorder) is an abnormal condition that makes it difficult for blood to clot. Platelets (specialized blood cells) and clotting factors (special blood proteins) combine to help the blood clot when a cut has been made in the skin. If platelets and clotting factors are not present in sufficient numbers, clots cannot form properly to stop bleeding.  CAUSES  Bleeding disorders usually have two causes:  Acquired bleeding disorders are due to a disease process or are drug induced, such as:   Severe liver disease.   Over treatment of Coumadin (Warfarin).   Drug induced Immune Thrombocytopenia. Heparin is a common cause.   Bone marrow disorders.   Lupus.   Prolonged use of antibiotics may cause vitamin K deficiency.   Congenital (Inherited) bleeding disorders.   Hemophilia.   Deficiencies of clotting Factors II, V, VII, IX, X.   Von Willebrands Disease. People with this disease are especially prone to bleeding if aspirin or NSAIDS (nonsteroidal anti-inflammatory drugs) products are taken.  SYMPTOMS  Whether the coagulopathy is acquired or congenital, symptoms are usually the same:   Unusual bleeding or bleeding very easily such as:   Nosebleeds.   Bloody stools or blood in the urine.   Abnormal or very heavy menstrual bleeding.   Cuts that bleed excessively.   Excessive bruising:   Bruising very easily.   Unexplained bruising.   Dizziness.  DIAGNOSIS  Coagulopathy is usually diagnosed by blood tests and a physical exam. This includes:  Clotting factor blood tests such as:   Partial Thromboplastin Time (PTT) and Prothrombin Time (PT). These tests show how long it takes your blood to clot. PTT and PT results may be high with a bleeding disorder.   Platelet counts. Platelets are a type of blood cell that help stop bleeding. The platelet count may be low with a bleeding disorder.   Fibrinogen levels. Fibrinogen is a protein produced by the  liver. It helps blood to clot. The Fibrinogen level may be low with bleeding disorder.   A physical exam may reveal bruising throughout the body.   Small petechiae (small pinpoint red or purple "dots"). Petechiae has a rash like appearance but does not itch. It may appear anywhere on the body, including the hands, feet, inside the mouth or even in the whites of the eyes.  TREATMENT  Treatment depends on whether the coagulopathy is acquired or congenital.   If clotting factors are low (deficient), these may need to be replaced.   Platelet transfusions may need to be given.   If your caregiver has determined that a medication is the cause of the bleeding disorder, your caregiver may need to adjust or discontinue the medication.   Treatment of the underlying disease state to correct the coagulopathy.   If your Vitamin K level is low, it will need to be replaced.  COMPLICATIONS OF COAGULOPATHY A bleeding disorder can develop very serious complications such as:  Bleeding into the brain (Intracerebral bleeding).   Liver failure.   Kidney failure.   Gastrointestinal bleeding.  SEEK IMMEDIATE MEDICAL CARE IF:  You have large areas of bruising.   You have a severe headache that does not go away.   You have blood in your urine or stool.   You vomit blood.   You develop redness or rashes on your skin.   You become dizzy or pass out (loss of consciousness).   You develop chest pain or shortness of breath. If the chest pain or shortness of breath is  severe, call your local emergency service immediately.  MAKE SURE YOU:   Understand these instructions.   Will watch your condition.   Will get help right away if you are not doing well or get worse.  Document Released: 03/17/2004 Document Revised: 05/17/2011 Document Reviewed: 07/08/2008 [facility] Patient Information 2012 [place].     Be sure that your physicians at [facility] area aware that your coumadin level is  high at 4.75.  I recommend that you stop todays dose and speak to your doctor regarding future doses.  Take nausea medications as prescribed.  If you develop chest pain, abdominal pain, fevers, or persistent vomiting, please return.   No blood was seen in your stool today.  Urinary Tract Infection Infections of the urinary tract can start in several places. A bladder infection (cystitis), a kidney infection (pyelonephritis), and a prostate infection (prostatitis) are different types of urinary tract infections (UTIs). They usually get better if treated with medicines (antibiotics) that kill germs. Take all the medicine until it is gone. You or your child may feel better in a few days, but TAKE ALL MEDICINE or the infection may not respond and may become more difficult to treat. HOME CARE INSTRUCTIONS   Drink enough water and fluids to keep the urine clear or pale yellow. Cranberry juice is especially recommended, in addition to large amounts of water.   Avoid caffeine, tea, and carbonated beverages. They tend to irritate the bladder.   Alcohol may irritate the prostate.   Only take over-the-counter or prescription medicines for pain, discomfort, or fever as directed by your caregiver.  To prevent further infections:  Empty the bladder often. Avoid holding urine for long periods of time.   After a bowel movement, women should cleanse from front to back. Use each tissue only once.   Empty the bladder before and after sexual intercourse.  FINDING OUT THE RESULTS OF YOUR TEST Not all test results are available during your visit. If your or your child's test results are not back during the visit, make an appointment with your caregiver to find out the results. Do not assume everything is normal if you have not heard from your caregiver or the medical facility. It is important for you to follow up on all test results. SEEK MEDICAL CARE IF:   There is back pain.   Your baby is older than 3 months  with a rectal temperature of 100.5 F (38.1 C) or higher for more than 1 day.   Your or your child's problems (symptoms) are no better in 3 days. Return sooner if you or your child is getting worse.  SEEK IMMEDIATE MEDICAL CARE IF:   There is severe back pain or lower abdominal pain.   You or your child develops chills.   You have a fever.   Your baby is older than 3 months with a rectal temperature of 102 F (38.9 C) or higher.   Your baby is 67 months old or younger with a rectal temperature of 100.4 F (38 C) or higher.   There is nausea or vomiting.   There is continued burning or discomfort with urination.  MAKE SURE YOU:   Understand these instructions.   Will watch your condition.   Will get help right away if you are not doing well or get worse.  Document Released: 03/07/2005 Document Revised: 05/17/2011 Document Reviewed: 10/10/2006 [facility] Patient Information 2012 [place].

## 2011-09-14 NOTE — ED Notes (Signed)
Pt given turkey sandwich

## 2011-09-14 NOTE — ED Notes (Signed)
Pt was scheduled for foot surgery today but cancelled by MD bc pt INR was elevated.

## 2011-09-14 NOTE — ED Provider Notes (Signed)
History     CSN: [id number]  Arrival date & time 09/14/11  1042   First MD Initiated Contact with Patient 09/14/11 1127      Chief Complaint  Patient presents with  . Abnormal Lab  . Nausea    (Consider location/radiation/quality/duration/timing/severity/associated sxs/prior treatment) HPI Comments: Patient is currently at [facility] nursing facility in rehabilitation, who is seen here in the short stay area because she was supposed to have a partial amputation of her left foot secondary to ulcerations and some mild infections by Dr. Sharol Given.  However even though she was supposed to have stopped her Coumadin about one week ago, staff at [facility] apparently continued to give her her Coumadin. This was discovered this morning. Family is concerned because approximately one month ago she presented with nausea and vomiting and was found to have a urinary tract infection in her Coumadin level was very high. She was also found to have a slight lower GI bleed. She was admitted for this. Because she is on the Coumadin and her INR was elevated from blood tests done yesterday, her surgery was canceled. The palate the patient also had a couple episodes of vomiting yesterday which was improved after being given Phenergan. She reports some continued nausea this morning but no further episodes of vomiting. Due to the family's concern, the patient was sent down here to the emergency department for further evaluation. The patient denies chest pain or abdominal pain. She denies any back pain. She is unaware of any black stools although reports that he may of little dark. She denies fever or chills.  The history is provided by the patient, medical records and a relative.    Past Medical History  Diagnosis Date  . HTN (hypertension)   . HLD (hyperlipidemia)   . History of methicillin resistant staphylococcus aureus (MRSA)   . CVA (cerebral infarction)   . Anemia   . Tachycardia   . Obesity   .  Osteomyelitis   . Lower back pain     Chronic  . Idiopathic peripheral neuropathy     NOS  . DJD (degenerative joint disease)   . Headache   . GERD (gastroesophageal reflux disease)   . Depression   . Anxiety   . Hypokalemia     resolved  . Cerebral infarct     left parietal and bilateral  . Esophagitis     distal  . Fluid overload     compensated  . Urinary retention   . H/O: pneumonia   . History of bacteremia   . Surgery, other elective     of the ear  . Stroke     Hx of left frontoparietal stroke in 12/11. History of right brain stroke.  . Cardiomyopathy     Echocardiogram 04/23/11: Mild LVH, EF 30-35%, mild MR, mild LAE, mild-moderate RVE.  TEE 04/25/11: Moderate LVH, severe biventricular dysfunction, EF 20-25%, moderate to severe MR, moderate TR, biatrial enlargement, no PFO, negative bubble study  . Shortness of breath     with exertion   . Sleep apnea     no machine used  pt does not remember test for sleep apnea  . DM type 2 (diabetes mellitus, type 2)     type two   oral meds only   . Renal failure     due to vanc toxicity  no hd    Past Surgical History  Procedure Date  . Tympanoplasty   . Toe amputation  left 2nd toe  . Dilation and curettage of uterus     history  . Tee without cardioversion 04/25/2011    Procedure: TRANSESOPHAGEAL ECHOCARDIOGRAM (TEE);  Surgeon: Jolaine Artist, MD;  Location: [facility] ENDOSCOPY;  Service: Cardiovascular;  Laterality: N/A;  . Esophagogastroduodenoscopy 08/14/2011    Procedure: ESOPHAGOGASTRODUODENOSCOPY (EGD);  Surgeon: Scarlette Shorts, MD;  Location: [facility] ENDOSCOPY;  Service: Endoscopy;  Laterality: N/A;    Family History  Problem Relation Age of Onset  . Coronary artery disease    . Diabetes    . Cancer Mother   . Diabetes Mother   . Heart disease Father   . Hyperlipidemia Father   . Hypertension Father   . Stroke Father   . Heart disease Brother   . Anesthesia problems Neg Hx   . Hypotension Neg Hx   . Malignant  hyperthermia Neg Hx   . Pseudochol deficiency Neg Hx     History  Substance Use Topics  . Smoking status: Never Smoker   . Smokeless tobacco: Never Used  . Alcohol Use: Yes     rare on holidays    OB History    Grav Para Term Preterm Abortions TAB SAB Ect Mult Living                  Review of Systems  Cardiovascular: Negative for chest pain.  Gastrointestinal: Positive for nausea and vomiting. Negative for abdominal pain, diarrhea and constipation.  Skin: Positive for wound.  All other systems reviewed and are negative.    Allergies  Ace inhibitors; Lisinopril; and Vancomycin  Home Medications   Current Outpatient Rx  Name Route Sig Dispense Refill  . ACETAMINOPHEN 325 MG PO TABS Oral Take 650 mg by mouth every 4 (four) hours as needed. For pain    . ALPRAZOLAM 0.25 MG PO TABS Oral Take 0.25 mg by mouth 3 (three) times daily as needed. For anxiety    . MAALOX PLUS PO Oral Take 30 mLs by mouth 4 (four) times daily as needed. For indigestion.    Marland Kitchen BIOTENE DRY MOUTH MT LIQD Mouth Rinse 15 mLs by Mouth Rinse route 2 (two) times daily. Swish and Spit    . ATORVASTATIN CALCIUM 20 MG PO TABS Oral Take 20 mg by mouth at bedtime.    Marland Kitchen CITALOPRAM HYDROBROMIDE 10 MG PO TABS Oral Take 10 mg by mouth at bedtime.     . CYCLOSPORINE 0.05 % OP EMUL Both Eyes Place 1 drop into both eyes 2 (two) times daily.     Marland Kitchen FERROUS SULFATE 325 (65 FE) MG PO TABS Oral Take 325 mg by mouth 2 (two) times daily.     Marland Kitchen GABAPENTIN 300 MG PO CAPS Oral Take 600 mg by mouth 4 (four) times daily.    Marland Kitchen GLIMEPIRIDE 2 MG PO TABS Oral Take 2 mg by mouth daily before breakfast.     . HYDROCODONE-ACETAMINOPHEN 5-325 MG PO TABS Oral Take 1 tablet by mouth every 4 (four) hours as needed. For pain    . INSULIN ASPART 100 UNIT/ML [employer] SOLN Subcutaneous Inject 5 Units into the skin 3 (three) times daily with meals as needed. If CBG >150    . LOSARTAN POTASSIUM 100 MG PO TABS Oral Take 100 mg by mouth daily.     Marland Kitchen  METHOCARBAMOL 500 MG PO TABS Oral Take 500 mg by mouth every 6 (six) hours as needed. For muscle spasms.    . METHYLPHENIDATE HCL 10 MG PO TABS Oral Take  1 tablet (10 mg total) by mouth 2 (two) times daily before a meal. 60 tablet 0  . METOPROLOL TARTRATE 100 MG PO TABS Oral Take 100 mg by mouth 2 (two) times daily.    [place] M PLUS PO TABS Oral Take 2 tablets by mouth daily.    Marland Kitchen BACITRACIN-NEOMYCIN-POLYMYXIN OINTMENT TUBE Topical Apply 1 application topically daily. To foot and toes on left foot    . NITROFURANTOIN MONOHYD MACRO 100 MG PO CAPS Oral Take 100 mg by mouth 2 (two) times daily.    Marland Kitchen VITAMIN C 500 MG PO TABS Oral Take 500 mg by mouth 2 (two) times daily.    . CEPHALEXIN 500 MG PO CAPS Oral Take 1 capsule (500 mg total) by mouth 3 (three) times daily. 30 capsule 0  . ENOXAPARIN SODIUM 100 MG/ML [place] SOLN Subcutaneous Inject 1.1 mLs (110 mg total) into the skin every 12 (twelve) hours. 12.6 mL 0  . ONDANSETRON 8 MG PO TBDP Oral Take 1 tablet (8 mg total) by mouth every 12 (twelve) hours as needed for nausea. 20 tablet 0    BP 154/93  Pulse 92  Temp(Src) 98.5 F (36.9 C) (Rectal)  Resp 16  SpO2 100%  Physical Exam  Constitutional: She is oriented to person, place, and time. She appears well-developed and well-nourished.  HENT:  Head: Normocephalic and atraumatic.  Eyes: Pupils are equal, round, and reactive to light.  Neck: Neck supple.  Cardiovascular: Normal rate.   Pulmonary/Chest: Effort normal.  Abdominal: Soft. There is no rebound and no guarding.  Musculoskeletal: She exhibits edema and tenderness.  Neurological: She is alert and oriented to person, place, and time.       Facial droop to left face, known to be old from prior stroke  Skin: Skin is warm and dry.  Psychiatric: She has a normal mood and affect.    ED Course  Procedures (including critical care time)  Labs Reviewed  CBC - Abnormal; Notable for the following:    RDW 15.9 (*)    Platelets 413 (*)     All other components within normal limits  PROTIME-INR - Abnormal; Notable for the following:    Prothrombin Time 45.3 (*)    INR 4.75 (*)    All other components within normal limits  URINALYSIS, ROUTINE W REFLEX MICROSCOPIC - Abnormal; Notable for the following:    APPearance TURBID (*)    Hgb urine dipstick LARGE (*)    Protein, ur >300 (*)    Leukocytes, UA LARGE (*)    All other components within normal limits  BASIC METABOLIC PANEL - Abnormal; Notable for the following:    Glucose, Bld 112 (*)    BUN 24 (*)    GFR calc non Af Amer 68 (*)    GFR calc Af Amer 79 (*)    All other components within normal limits  URINE MICROSCOPIC-ADD ON - Abnormal; Notable for the following:    Squamous Epithelial / LPF MANY (*)    Bacteria, UA FEW (*)    Casts GRANULAR CAST (*)    All other components within normal limits  OCCULT BLOOD, POC DEVICE  URINE CULTURE   Dg Chest 2 View  09/13/2011  *RADIOLOGY REPORT*  Clinical Data: Preop radiograph  CHEST - 2 VIEW  Comparison: 04/22/2011  Findings: Heart size is mildly enlarged.  The lung volumes appear lobe.  No pleural effusion or edema.  No airspace consolidation identified.  The visualized osseous structures appear unremarkable.  IMPRESSION:  1. No acute cardiopulmonary abnormalities.  Original Report Authenticated By: Angelita Ingles, M.D.     1. Warfarin-induced coagulopathy   2. Nausea   3. Urinary tract infection       MDM  Pt's INR is 4.7, will need to stop coumadin for now.  PT's surgery was cancelled.  Abd is soft, pt is hungry and wishes to eat.  No tenderness on exam, I do not think a sig abd process is occurring.  Will check a UA as well.  Otherwise I think pt is stable for discharge to home.          Saddie Benders. Yailen Zemaitis, MD 09/14/11 [phone]

## 2011-09-14 NOTE — Progress Notes (Signed)
Pt and family do not want to proceed with surgery today d/t elevated PT/INR levels;Dr.Duda notified of family request and OK with this.Office will call them to reschedule.OR notified as well

## 2011-09-14 NOTE — ED Notes (Signed)
Pt requesting ativan for her nerves

## 2011-09-14 NOTE — ED Notes (Signed)
To ed for eval after pt's foot surgery was cancelled due to high inr this am. Pt also nauseated.

## 2011-09-14 NOTE — ED Notes (Signed)
[facility] called to take pt back to [facility]

## 2011-09-17 ENCOUNTER — Encounter: Payer: Self-pay | Admitting: Physical Medicine & Rehabilitation

## 2011-09-17 ENCOUNTER — Ambulatory Visit (HOSPITAL_BASED_OUTPATIENT_CLINIC_OR_DEPARTMENT_OTHER): Payer: Medicaid Other | Admitting: Physical Medicine & Rehabilitation

## 2011-09-17 ENCOUNTER — Encounter (HOSPITAL_COMMUNITY): Payer: Self-pay | Admitting: Pharmacy Technician

## 2011-09-17 ENCOUNTER — Other Ambulatory Visit (HOSPITAL_COMMUNITY): Payer: Self-pay | Admitting: Orthopedic Surgery

## 2011-09-17 VITALS — BP 158/97 | HR 86 | Resp 18 | Ht 67.0 in | Wt 221.0 lb

## 2011-09-17 DIAGNOSIS — H5340 Unspecified visual field defects: Secondary | ICD-10-CM | POA: Insufficient documentation

## 2011-09-17 DIAGNOSIS — E119 Type 2 diabetes mellitus without complications: Secondary | ICD-10-CM | POA: Insufficient documentation

## 2011-09-17 DIAGNOSIS — M7530 Calcific tendinitis of unspecified shoulder: Secondary | ICD-10-CM

## 2011-09-17 DIAGNOSIS — I69998 Other sequelae following unspecified cerebrovascular disease: Secondary | ICD-10-CM | POA: Insufficient documentation

## 2011-09-17 DIAGNOSIS — I69959 Hemiplegia and hemiparesis following unspecified cerebrovascular disease affecting unspecified side: Secondary | ICD-10-CM | POA: Insufficient documentation

## 2011-09-17 DIAGNOSIS — I10 Essential (primary) hypertension: Secondary | ICD-10-CM | POA: Insufficient documentation

## 2011-09-17 DIAGNOSIS — H539 Unspecified visual disturbance: Secondary | ICD-10-CM | POA: Insufficient documentation

## 2011-09-17 NOTE — Progress Notes (Signed)
Left shoulder diagnostic ultrasound Left shoulder pain after CVA Patient placed in a seated position. 12 Hz transducer linear. Biceps groove identified imaged in short and long axis views. No evidence of tear or fluid collection. Right supraspinatus imaged no evidence of tear. There is a calcific deposit 0.44 cm x 0.7 cm The calcific deposit was imaged in both short axis and long axis views Infraspinatus was imaged short axis and long axis views no evidence of tear no abnormalities A.c. joint was imaged in long axis view. There is evidence of joint irregularity no evidence of capsular disruption. The subscapularis muscle was imaged in long axis views no evidence of tear or fluid collection.  Impression 1 left subdeltoid calcific bursitis 2. No evidence of supraspinatus tear 3. A.c. joint arthritis no evidence of capsular disruption.

## 2011-09-17 NOTE — Patient Instructions (Signed)
Return for aspiration of calcific bursitis

## 2011-09-18 ENCOUNTER — Encounter: Payer: Self-pay | Admitting: Physical Medicine & Rehabilitation

## 2011-09-18 ENCOUNTER — Encounter (HOSPITAL_BASED_OUTPATIENT_CLINIC_OR_DEPARTMENT_OTHER): Payer: Medicaid Other | Attending: General Surgery

## 2011-09-18 DIAGNOSIS — I739 Peripheral vascular disease, unspecified: Secondary | ICD-10-CM | POA: Insufficient documentation

## 2011-09-18 DIAGNOSIS — R5383 Other fatigue: Secondary | ICD-10-CM | POA: Insufficient documentation

## 2011-09-18 DIAGNOSIS — L97509 Non-pressure chronic ulcer of other part of unspecified foot with unspecified severity: Secondary | ICD-10-CM | POA: Insufficient documentation

## 2011-09-18 DIAGNOSIS — R5381 Other malaise: Secondary | ICD-10-CM | POA: Insufficient documentation

## 2011-09-18 DIAGNOSIS — I69998 Other sequelae following unspecified cerebrovascular disease: Secondary | ICD-10-CM | POA: Insufficient documentation

## 2011-09-18 DIAGNOSIS — L8990 Pressure ulcer of unspecified site, unspecified stage: Secondary | ICD-10-CM | POA: Insufficient documentation

## 2011-09-18 DIAGNOSIS — Z7901 Long term (current) use of anticoagulants: Secondary | ICD-10-CM | POA: Insufficient documentation

## 2011-09-18 DIAGNOSIS — K219 Gastro-esophageal reflux disease without esophagitis: Secondary | ICD-10-CM | POA: Insufficient documentation

## 2011-09-18 DIAGNOSIS — Z79899 Other long term (current) drug therapy: Secondary | ICD-10-CM | POA: Insufficient documentation

## 2011-09-18 DIAGNOSIS — E785 Hyperlipidemia, unspecified: Secondary | ICD-10-CM | POA: Insufficient documentation

## 2011-09-18 DIAGNOSIS — G609 Hereditary and idiopathic neuropathy, unspecified: Secondary | ICD-10-CM | POA: Insufficient documentation

## 2011-09-18 DIAGNOSIS — E1169 Type 2 diabetes mellitus with other specified complication: Secondary | ICD-10-CM | POA: Insufficient documentation

## 2011-09-19 ENCOUNTER — Ambulatory Visit (HOSPITAL_COMMUNITY): Admission: RE | Admit: 2011-09-19 | Payer: Medicaid Other | Source: Ambulatory Visit | Admitting: Orthopedic Surgery

## 2011-09-19 ENCOUNTER — Encounter (HOSPITAL_COMMUNITY): Admission: RE | Payer: Self-pay | Source: Ambulatory Visit

## 2011-09-19 SURGERY — AMPUTATION BELOW KNEE
Anesthesia: General | Site: Leg Lower | Laterality: Left

## 2011-09-20 ENCOUNTER — Encounter: Payer: Self-pay | Admitting: Vascular Surgery

## 2011-09-21 ENCOUNTER — Ambulatory Visit: Payer: Medicaid Other | Admitting: Vascular Surgery

## 2011-10-04 ENCOUNTER — Encounter: Payer: Self-pay | Admitting: Vascular Surgery

## 2011-10-05 ENCOUNTER — Ambulatory Visit (INDEPENDENT_AMBULATORY_CARE_PROVIDER_SITE_OTHER): Payer: Medicaid Other | Admitting: Vascular Surgery

## 2011-10-05 ENCOUNTER — Encounter: Payer: Self-pay | Admitting: Vascular Surgery

## 2011-10-05 VITALS — BP 122/87 | HR 76 | Temp 98.1°F | Ht 67.0 in | Wt 220.0 lb

## 2011-10-05 DIAGNOSIS — L98499 Non-pressure chronic ulcer of skin of other sites with unspecified severity: Secondary | ICD-10-CM

## 2011-10-05 DIAGNOSIS — I739 Peripheral vascular disease, unspecified: Secondary | ICD-10-CM

## 2011-10-05 NOTE — Progress Notes (Addendum)
[facility] [place]  Established Critical Limb Ischemia Patient  History of Present Illness  [name] is a 51 y.o. (1961/04/06) female who presents with chief complaint: left foot gangrene.  The patient has dry gangrene wounds in left foot which by report was going to undergo a TMA or BKA but was canceled due to high INR.  The patient's ambulation is limited by her CVA.  The patient's treatment regimen currently included: maximal medical management and local wound care.  The family would like a second opinion in regards to the amputation.  Past Medical History  Diagnosis Date  . HTN (hypertension)   . HLD (hyperlipidemia)   . History of methicillin resistant staphylococcus aureus (MRSA)   . CVA (cerebral infarction)   . Anemia   . Tachycardia   . Obesity   . Osteomyelitis   . Lower back pain     Chronic  . Idiopathic peripheral neuropathy     NOS  . DJD (degenerative joint disease)   . Headache   . GERD (gastroesophageal reflux disease)   . Depression   . Anxiety   . Hypokalemia     resolved  . Cerebral infarct     left parietal and bilateral  . Esophagitis     distal  . Fluid overload     compensated  . Urinary retention   . H/O: pneumonia   . History of bacteremia   . Surgery, other elective     of the ear  . Stroke     Hx of left frontoparietal stroke in 12/11. History of right brain stroke.  . Cardiomyopathy     Echocardiogram 04/23/11: Mild LVH, EF 30-35%, mild MR, mild LAE, mild-moderate RVE.  TEE 04/25/11: Moderate LVH, severe biventricular dysfunction, EF 20-25%, moderate to severe MR, moderate TR, biatrial enlargement, no PFO, negative bubble study  . Shortness of breath     with exertion   . Sleep apnea     no machine used  pt does not remember test for sleep apnea  . DM type 2 (diabetes mellitus, type 2)     type two   oral meds only   . 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 Tympanoplasty   . Toe amputation     left 2nd toe  . Dilation and curettage of uterus     history  . Tee without cardioversion 04/25/2011    Procedure: TRANSESOPHAGEAL ECHOCARDIOGRAM (TEE);  Surgeon: Jolaine Artist, MD;  Location: [facility] ENDOSCOPY;  Service: Cardiovascular;  Laterality: N/A;  . Esophagogastroduodenoscopy 08/14/2011    Procedure: ESOPHAGOGASTRODUODENOSCOPY (EGD);  Surgeon: Scarlette Shorts, MD;  Location: [facility] ENDOSCOPY;  Service: Endoscopy;  Laterality: N/A;    History   Social History  . Marital Status: Divorced    Spouse Name: N/A    Number of Children: N/A  . Years of Education: N/A   Occupational History  . Not on file.   Social History Main Topics  . Smoking status: Never Smoker   . Smokeless tobacco: Never Used  . Alcohol Use: Yes     rare on holidays  . Drug Use: No  . Sexually Active: Not Currently   Other Topics Concern  . Not on file   Social History Narrative   DivorcedOne of 10 children5 in [place] and was living with sister at time of CVANonsmokerNondrinker    Family History  Problem Relation Age of Onset  . Coronary artery disease    . Diabetes    . Cancer Mother   . Diabetes Mother   . Heart disease Father   . Hyperlipidemia Father   . Hypertension Father   . Stroke Father   . Heart disease Brother   . Anesthesia problems Neg Hx   . Hypotension Neg Hx   . Malignant hyperthermia Neg Hx   . Pseudochol deficiency Neg Hx     No current facility-administered medications on file prior to visit.   Current Outpatient Prescriptions on File Prior to Visit  Medication Sig Dispense Refill  . acetaminophen (TYLENOL) 325 MG tablet Take 650 mg by mouth every 4 (four) hours as needed. For pain      . ALPRAZolam (XANAX) 0.25 MG tablet Take 0.25 mg by mouth 3 (three) times daily as needed. For anxiety      . Alum & Mag Hydroxide-Simeth (MAALOX PLUS PO) Take 30 mLs by mouth 4 (four) times daily as needed. For indigestion.      Marland Kitchen amLODipine (NORVASC) 5 MG  tablet Take 5 mg by mouth daily.      Marland Kitchen antiseptic oral rinse (BIOTENE) LIQD 15 mLs by Mouth Rinse route 2 (two) times daily. Swish and Spit      . atorvastatin (LIPITOR) 20 MG tablet Take 20 mg by mouth at bedtime.      . citalopram (CELEXA) 10 MG tablet Take 10 mg by mouth daily.       . cloNIDine (CATAPRES) 0.1 MG tablet Take 0.1 mg by mouth every 8 (eight) hours as needed. Elevated blood pressure      . cycloSPORINE (RESTASIS) 0.05 % ophthalmic emulsion Place 1 drop into both eyes 2 (two) times daily.       . ferrous sulfate 325 (65 FE) MG tablet Take 325 mg by mouth 2 (two) times daily.       Marland Kitchen glimepiride (AMARYL) 2 MG tablet Take 2 mg by mouth daily before breakfast.       . HYDROcodone-acetaminophen (NORCO) 5-325 MG per tablet Take 1 tablet by mouth every 4 (four) hours as needed. For pain      . insulin aspart (NOVOLOG) 100 UNIT/ML injection Inject 5 Units into the skin 4 (four) times daily -  with meals and at bedtime. If CBG >150      . losartan (COZAAR) 100 MG tablet Take 100 mg by mouth daily.       . methocarbamol (ROBAXIN) 500 MG tablet Take 500 mg by mouth every 6 (six) hours as needed. For muscle spasms.      . methylphenidate (RITALIN) 10 MG tablet Take 10 mg by mouth daily.       . metoprolol (LOPRESSOR) 100 MG tablet Take 100 mg by mouth 2 (two) times daily.      . Multiple Vitamins-Minerals (MULTIVITAMINS THER. W/MINERALS) TABS Take 2 tablets by mouth daily.      Marland Kitchen neomycin-bacitracin-polymyxin (NEOSPORIN) OINT Apply 1 application topically daily. To foot and toes on left foot      . nitrofurantoin, macrocrystal-monohydrate, (MACROBID) 100 MG capsule Take 100 mg by mouth daily.       . vitamin C (ASCORBIC ACID) 500 MG tablet Take 500 mg by mouth 2 (two) times daily.      Marland Kitchen DISCONTD: furosemide (LASIX) 20 MG tablet Take 20 mg by mouth daily.        Marland Kitchen DISCONTD: potassium chloride SA (K-DUR,KLOR-CON) 20 MEQ tablet Take  20 mEq by mouth daily.      Marland Kitchen DISCONTD: promethazine  (PHENERGAN) 25 MG/ML injection Inject 25 mg into the muscle every 6 (six) hours as needed. For nausea/vomiting        Allergies  Allergen Reactions  . Ace Inhibitors Shortness Of Breath  . Lisinopril Other (See Comments)    Per MAR  . Vancomycin Other (See Comments)    Per MAR    Review of Systems (Positive items checked otherwise negative)  General: [ ]  Weight loss, [ ]  Weight gain, [ ]   Loss of appetite, [ ]  Fever  Neurologic: [ ]  Dizziness, [ ]  Blackouts, [ ]  Headaches, [ ]  Seizure  Ear/Nose/Throat: [x]  Change in eyesight, [ ]  Change in hearing, [ ]  Nose bleeds, [ ]  Sore throat  Vascular: [x]  Pain in legs with walking, [x]  Pain in feet while lying flat, [x]  Non-healing ulcer, [x]  Stroke, [ ]  "Mini stroke", [ ]  Slurred speech, [x]  Temporary blindness, [ ]  Blood clot in vein, [ ]  Phlebitis  Pulmonary: [ ]  Home oxygen, [ ]  Productive cough, [ ]  Bronchitis, [ ]  Coughing up blood, [ ]  Asthma, [ ]  Wheezing  Musculoskeletal: [ ]  Arthritis, [ ]  Joint pain, [ ]  Muscle pain  Cardiac: [ ]  Chest pain, [ ]  Chest tightness/pressure, [ ]  Shortness of breath when lying flat, [ ]  Shortness of breath with exertion, [ ]  Palpitations, [ ]  Heart murmur, [ ]  Arrythmia, [ ]  Atrial fibrillation  Hematologic: [ ]  Bleeding problems, [ ]  Clotting disorder, [ ]  Anemia  Psychiatric:  [ ]  Depression, [ ]  Anxiety, [ ]  Attention deficit disorder  Gastrointestinal:  [ ]  Black stool,[ ]   Blood in stool, [ ]  Peptic ulcer disease, [ ]  Reflux, [ ]  Hiatal hernia, [ ]  Trouble swallowing, [ ]  Diarrhea, [ ]  Constipation  Urinary:  [ ]  Kidney disease, [ ]  Burning with urination, [ ]  Frequent urination, [ ]  Difficulty urinating  Skin: [x]  Ulcers, [ ]  Rashes  Physical Examination  Filed Vitals:   10/05/11 1011  BP: 122/87  Pulse: 76  Temp: 98.1 F (36.7 C)  TempSrc: Oral  Height: 5\' 7"  (1.702 m)  Weight: 220 lb (99.791 kg)   Body mass index is 34.46 kg/(m^2).   General: A&O x 3, WDWN  Eyes: PERRLA,  EOMI  Pulmonary: Sym exp, good air movt, CTAB, no rales, rhonchi, & wheezing  Cardiac: RRR, Nl S1, S2, no Murmurs, rubs or gallops  Vascular: Vessel Right Left  Radial Palpable Palpable  Brachial Palpable Palpable  Carotid Palpable, without bruit Palpable, without bruit  Aorta Non-palpable N/A  Femoral Palpable Palpable  Popliteal Non-palpable Non-palpable  PT Non-Palpable Non-Palpable  DP Non-Palpable Non-Palpable   Gastrointestinal: soft, NTND, -G/R, - HSM, - masses, - CVAT B  Musculoskeletal: M/S 5/5 R side, 2-3/5 L side: able to lift leg against gravity and flex knee somewhat, Extremities without ischemic changes except left great toe ulcer with obvious tissue loss and eschar, 2nd toe previously amputation, 3rd toe with eschar at joint, 4th toe with lateral eschar, 5th toe with extensive dry gangrene, lateral malleolus ulcer with eschar  Neurologic: CN 2-12 intact except decrease shoulder shrug, Pain and light touch intact in extremities , Motor exam as listed above  Medical Decision Making  [name] is a 51 y.o. female who presents with: ischemic left foot s/p trauma, improving neurologic function in left leg   Based on the patient's vascular studies and examination, I have offered the patient: Aortogram, bilateral  leg runoff, possible L side intervention, scheduled for this coming Thursday 25th April 2013.  I still think this patient is best off with L BKA but I don't think its unreason to perform an angiogram on the L side to determine the status of the vasculature on that side.  This may help convince the family to proceed with the BKA.  I suspect too much tissue loss is present to salvage a TMA.    I don't think the risk of a surgical bypass is indicated when a L BKA is planned.  Subsequently, I think maximal effort at an endovascular intervention is recommended.  I discussed in depth with the patient the nature of atherosclerosis, and emphasized the importance of  maximal medical management including strict control of blood pressure, blood glucose, and lipid levels, antiplatelet agents, obtaining regular exercise, and cessation of smoking.  The patient is aware that without maximal medical management the underlying atherosclerotic disease process will progress, limiting the benefit of any interventions.  Thank you for allowing [place] to participate in this patient's care.  Adele Barthel, MD [facility] [place] Office: [phone] Pager: [phone]  10/05/2011, 5:46 PM

## 2011-10-08 ENCOUNTER — Encounter: Payer: Self-pay | Admitting: Physical Medicine & Rehabilitation

## 2011-10-08 ENCOUNTER — Encounter: Payer: Medicaid Other | Attending: Physical Medicine & Rehabilitation

## 2011-10-08 ENCOUNTER — Other Ambulatory Visit: Payer: Self-pay

## 2011-10-08 ENCOUNTER — Ambulatory Visit (HOSPITAL_BASED_OUTPATIENT_CLINIC_OR_DEPARTMENT_OTHER): Payer: Medicaid Other | Admitting: Physical Medicine & Rehabilitation

## 2011-10-08 VITALS — BP 133/74 | HR 78 | Resp 16 | Ht 67.0 in | Wt 218.0 lb

## 2011-10-08 DIAGNOSIS — M7530 Calcific tendinitis of unspecified shoulder: Secondary | ICD-10-CM

## 2011-10-08 NOTE — Patient Instructions (Signed)
Joint Injection Care After Refer to this sheet in the next few days. These instructions provide you with information on caring for yourself after you have had a joint injection. Your caregiver also may give you more specific instructions. Your treatment has been planned according to current medical practices, but problems sometimes occur. Call your caregiver if you have any problems or questions after your procedure. After any type of joint injection, it is not uncommon to experience:  Soreness, swelling, or bruising around the injection site.   Mild numbness, tingling, or weakness around the injection site caused by the numbing medicine used before or with the injection.  It also is possible to experience the following effects associated with the specific agent after injection:  Iodine-based contrast agents:   Allergic reaction (itching, hives, widespread redness, and swelling beyond the injection site).   Corticosteroids (These effects are rare.):   Allergic reaction.   Increased blood sugar levels (If you have diabetes and you notice that your blood sugar levels have increased, notify your caregiver).   Increased blood pressure levels.   Mood swings.   Hyaluronic acid in the use of viscosupplementation.   Temporary heat or redness.   Temporary rash and itching.   Increased fluid accumulation in the injected joint.  These effects all should resolve within a day after your procedure.  HOME CARE INSTRUCTIONS  Limit yourself to light activity the day of your procedure. Avoid lifting heavy objects, bending, stooping, or twisting.   Take prescription or over-the-counter pain medication as directed by your caregiver.   You may apply ice to your injection site to reduce pain and swelling the day of your procedure. Ice may be applied 3 to 4 times:   Put ice in a plastic bag.   Place a towel between your skin and the bag.   Leave the ice on for no longer than 15 to 20 minutes  each time.  SEEK IMMEDIATE MEDICAL CARE IF:   Pain and swelling get worse rather than better or extend beyond the injection site.   Numbness does not go away.   Blood or fluid continues to leak from the injection site.   You have chest pain.   You have swelling of your face or tongue.   You have trouble breathing or you become dizzy.   You develop a fever, chills, or severe tenderness at the injection site that last longer than 1 day.  MAKE SURE YOU:  Understand these instructions.   Watch your condition.   Get help right away if you are not doing well or if you get worse.  Document Released: 02/08/2011 Document Revised: 05/17/2011 Document Reviewed: 02/08/2011 [facility] Patient Information 2012 [place].

## 2011-10-08 NOTE — Progress Notes (Signed)
Aspiration/Injection Procedure Note [name] [id number] August 10, 1960  Procedure: Attempted aspiration of calcific bursitis with injection of 1 cc of 40 mg per mL Depo-Medrol and 4 cc of 1% lidocaine Indications: Left shoulder pain with evidence of calcific bursitis on ultrasound testing. A 25-gauge inch and a half needle was used to anesthetize skin and subcutaneous tissue 1% lidocaine x3 mL injected followed by insertion initially the 21-gauge 2 inch needle however this did not show up under ultrasound and therefore a 22-gauge ankle block needle was inserted. Attempted aspiration of calcific bursitis however did not aspirate any fluid and a solution of Depo-Medrol lidocaine injected as well  Procedure Details Consent: Risks of procedure as well as the alternatives and risks of each were explained to the (patient/caregiver).  Consent for procedure obtained. Time Out: Verified patient identification, verified procedure, site/side was marked, verified correct patient position, special equipment/implants available, medications/allergies/relevent history reviewed, required imaging and test results available.  Performed   Local Anesthesia Used:Lidocaine 1% plain; 56mL Amount of Fluid Aspirated: minimal amount Character of Fluid: clear Fluid was sent for:No fluid sent A sterile dressing was applied.  Patient did tolerate procedure well. Estimated blood loss: 0  [name] E 10/08/2011, 11:14 AM

## 2011-10-10 ENCOUNTER — Encounter (HOSPITAL_COMMUNITY): Payer: Self-pay | Admitting: Pharmacy Technician

## 2011-10-10 MED ORDER — CEFAZOLIN SODIUM-DEXTROSE 2-3 GM-% IV SOLR
2.0000 g | INTRAVENOUS | Status: DC
  Filled 2011-10-10: qty 50

## 2011-10-10 MED ORDER — CHLORHEXIDINE GLUCONATE 4 % EX LIQD
60.0000 mL | Freq: Once | CUTANEOUS | Status: DC
  Filled 2011-10-10: qty 60

## 2011-10-10 MED ORDER — SODIUM CHLORIDE 0.9 % IV SOLN: INTRAVENOUS | Status: DC

## 2011-10-11 ENCOUNTER — Ambulatory Visit (HOSPITAL_COMMUNITY)
Admission: RE | Admit: 2011-10-11 | Discharge: 2011-10-11 | Disposition: A | Discharge status: Skilled Nursing Facility | Payer: Medicaid Other | Source: Ambulatory Visit | Attending: Vascular Surgery | Admitting: Vascular Surgery

## 2011-10-11 ENCOUNTER — Encounter (HOSPITAL_COMMUNITY)
Admission: RE | Disposition: A | Discharge status: Skilled Nursing Facility | Payer: Self-pay | Source: Ambulatory Visit | Attending: Vascular Surgery

## 2011-10-11 DIAGNOSIS — I739 Peripheral vascular disease, unspecified: Secondary | ICD-10-CM | POA: Insufficient documentation

## 2011-10-11 LAB — BASIC METABOLIC PANEL
BUN: 31 mg/dL — ABNORMAL HIGH (ref 6–23)
CO2: 24 mEq/L (ref 19–32)
Calcium: 9.2 mg/dL (ref 8.4–10.5)
Creatinine, Ser: 0.92 mg/dL (ref 0.50–1.10)
GFR calc non Af Amer: 72 mL/min — ABNORMAL LOW (ref >90)
Glucose, Bld: 97 mg/dL (ref 70–99)
Sodium: 142 mEq/L (ref 135–145)

## 2011-10-11 LAB — APTT: aPTT: 46 seconds — ABNORMAL HIGH (ref 24–37)

## 2011-10-11 SURGERY — ABDOMINAL ANGIOGRAM
Anesthesia: LOCAL

## 2011-10-11 MED ORDER — CHLORHEXIDINE GLUCONATE 4 % EX LIQD: 60.0000 mL | Freq: Once | CUTANEOUS | Status: DC

## 2011-10-11 MED ORDER — SODIUM CHLORIDE 0.9 % IV SOLN
INTRAVENOUS | Status: DC
  Administered 2011-10-11: 07:00:00 via INTRAVENOUS

## 2011-10-11 MED ORDER — CEFAZOLIN SODIUM-DEXTROSE 2-3 GM-% IV SOLR
2.0000 g | INTRAVENOUS | Status: DC
  Filled 2011-10-11 (×2): qty 50

## 2011-10-11 NOTE — Progress Notes (Signed)
Patient's procedure was canceled due to elevated PT/INR by Dr. Bridgett Larsson.  Called [facility] and spoke with nurse to inform facility to continue to hold coumadin.  Office will reschedule procedure.  Also gave nurse contact information for Dr. Lianne Moris office to follow up in case office does not call with new appointment.  Patient to be discharged back to facility.

## 2011-10-11 NOTE — Interval H&P Note (Signed)
[facility] [place]  History and Physical Update  The patient was interviewed and re-examined.  The patient's previous History and Physical has been reviewed and is unchanged.  There is no change in the plan of care.  Adele Barthel, MD [facility] [place] Office: [phone] Pager: [phone]  10/11/2011, 7:20 AM

## 2011-10-11 NOTE — H&P (View-Only) (Signed)
[facility] [place]  Established Critical Limb Ischemia Patient  History of Present Illness  [name] is a 51 y.o. (Aug 14, 1960) female who presents with chief complaint: left foot gangrene.  The patient has dry gangrene wounds in left foot which by report was going to undergo a TMA or BKA but was canceled due to high INR.  The patient's ambulation is limited by her CVA.  The patient's treatment regimen currently included: maximal medical management and local wound care.  The family would like a second opinion in regards to the amputation.  Past Medical History  Diagnosis Date  . HTN (hypertension)   . HLD (hyperlipidemia)   . History of methicillin resistant staphylococcus aureus (MRSA)   . CVA (cerebral infarction)   . Anemia   . Tachycardia   . Obesity   . Osteomyelitis   . Lower back pain     Chronic  . Idiopathic peripheral neuropathy     NOS  . DJD (degenerative joint disease)   . Headache   . GERD (gastroesophageal reflux disease)   . Depression   . Anxiety   . Hypokalemia     resolved  . Cerebral infarct     left parietal and bilateral  . Esophagitis     distal  . Fluid overload     compensated  . Urinary retention   . H/O: pneumonia   . History of bacteremia   . Surgery, other elective     of the ear  . Stroke     Hx of left frontoparietal stroke in 12/11. History of right brain stroke.  . Cardiomyopathy     Echocardiogram 04/23/11: Mild LVH, EF 30-35%, mild MR, mild LAE, mild-moderate RVE.  TEE 04/25/11: Moderate LVH, severe biventricular dysfunction, EF 20-25%, moderate to severe MR, moderate TR, biatrial enlargement, no PFO, negative bubble study  . Shortness of breath     with exertion   . Sleep apnea     no machine used  pt does not remember test for sleep apnea  . DM type 2 (diabetes mellitus, type 2)     type two   oral meds only   . 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 Tympanoplasty   . Toe amputation     left 2nd toe  . Dilation and curettage of uterus     history  . Tee without cardioversion 04/25/2011    Procedure: TRANSESOPHAGEAL ECHOCARDIOGRAM (TEE);  Surgeon: Jolaine Artist, MD;  Location: [facility] ENDOSCOPY;  Service: Cardiovascular;  Laterality: N/A;  . Esophagogastroduodenoscopy 08/14/2011    Procedure: ESOPHAGOGASTRODUODENOSCOPY (EGD);  Surgeon: Scarlette Shorts, MD;  Location: [facility] ENDOSCOPY;  Service: Endoscopy;  Laterality: N/A;    History   Social History  . Marital Status: Divorced    Spouse Name: N/A    Number of Children: N/A  . Years of Education: N/A   Occupational History  . Not on file.   Social History Main Topics  . Smoking status: Never Smoker   . Smokeless tobacco: Never Used  . Alcohol Use: Yes     rare on holidays  . Drug Use: No  . Sexually Active: Not Currently   Other Topics Concern  . Not on file   Social History Narrative   DivorcedOne of 10 children5 in [place] and was living with sister at time of CVANonsmokerNondrinker    Family History  Problem Relation Age of Onset  . Coronary artery disease    . Diabetes    . Cancer Mother   . Diabetes Mother   . Heart disease Father   . Hyperlipidemia Father   . Hypertension Father   . Stroke Father   . Heart disease Brother   . Anesthesia problems Neg Hx   . Hypotension Neg Hx   . Malignant hyperthermia Neg Hx   . Pseudochol deficiency Neg Hx     No current facility-administered medications on file prior to visit.   Current Outpatient Prescriptions on File Prior to Visit  Medication Sig Dispense Refill  . acetaminophen (TYLENOL) 325 MG tablet Take 650 mg by mouth every 4 (four) hours as needed. For pain      . ALPRAZolam (XANAX) 0.25 MG tablet Take 0.25 mg by mouth 3 (three) times daily as needed. For anxiety      . Alum & Mag Hydroxide-Simeth (MAALOX PLUS PO) Take 30 mLs by mouth 4 (four) times daily as needed. For indigestion.      Marland Kitchen amLODipine (NORVASC) 5 MG  tablet Take 5 mg by mouth daily.      Marland Kitchen antiseptic oral rinse (BIOTENE) LIQD 15 mLs by Mouth Rinse route 2 (two) times daily. Swish and Spit      . atorvastatin (LIPITOR) 20 MG tablet Take 20 mg by mouth at bedtime.      . citalopram (CELEXA) 10 MG tablet Take 10 mg by mouth daily.       . cloNIDine (CATAPRES) 0.1 MG tablet Take 0.1 mg by mouth every 8 (eight) hours as needed. Elevated blood pressure      . cycloSPORINE (RESTASIS) 0.05 % ophthalmic emulsion Place 1 drop into both eyes 2 (two) times daily.       . ferrous sulfate 325 (65 FE) MG tablet Take 325 mg by mouth 2 (two) times daily.       Marland Kitchen glimepiride (AMARYL) 2 MG tablet Take 2 mg by mouth daily before breakfast.       . HYDROcodone-acetaminophen (NORCO) 5-325 MG per tablet Take 1 tablet by mouth every 4 (four) hours as needed. For pain      . insulin aspart (NOVOLOG) 100 UNIT/ML injection Inject 5 Units into the skin 4 (four) times daily -  with meals and at bedtime. If CBG >150      . losartan (COZAAR) 100 MG tablet Take 100 mg by mouth daily.       . methocarbamol (ROBAXIN) 500 MG tablet Take 500 mg by mouth every 6 (six) hours as needed. For muscle spasms.      . methylphenidate (RITALIN) 10 MG tablet Take 10 mg by mouth daily.       . metoprolol (LOPRESSOR) 100 MG tablet Take 100 mg by mouth 2 (two) times daily.      . Multiple Vitamins-Minerals (MULTIVITAMINS THER. W/MINERALS) TABS Take 2 tablets by mouth daily.      Marland Kitchen neomycin-bacitracin-polymyxin (NEOSPORIN) OINT Apply 1 application topically daily. To foot and toes on left foot      . nitrofurantoin, macrocrystal-monohydrate, (MACROBID) 100 MG capsule Take 100 mg by mouth daily.       . vitamin C (ASCORBIC ACID) 500 MG tablet Take 500 mg by mouth 2 (two) times daily.      Marland Kitchen DISCONTD: furosemide (LASIX) 20 MG tablet Take 20 mg by mouth daily.        Marland Kitchen DISCONTD: potassium chloride SA (K-DUR,KLOR-CON) 20 MEQ tablet Take  20 mEq by mouth daily.      Marland Kitchen DISCONTD: promethazine  (PHENERGAN) 25 MG/ML injection Inject 25 mg into the muscle every 6 (six) hours as needed. For nausea/vomiting        Allergies  Allergen Reactions  . Ace Inhibitors Shortness Of Breath  . Lisinopril Other (See Comments)    Per MAR  . Vancomycin Other (See Comments)    Per MAR    Review of Systems (Positive items checked otherwise negative)  General: [ ]  Weight loss, [ ]  Weight gain, [ ]   Loss of appetite, [ ]  Fever  Neurologic: [ ]  Dizziness, [ ]  Blackouts, [ ]  Headaches, [ ]  Seizure  Ear/Nose/Throat: [x]  Change in eyesight, [ ]  Change in hearing, [ ]  Nose bleeds, [ ]  Sore throat  Vascular: [x]  Pain in legs with walking, [x]  Pain in feet while lying flat, [x]  Non-healing ulcer, [x]  Stroke, [ ]  "Mini stroke", [ ]  Slurred speech, [x]  Temporary blindness, [ ]  Blood clot in vein, [ ]  Phlebitis  Pulmonary: [ ]  Home oxygen, [ ]  Productive cough, [ ]  Bronchitis, [ ]  Coughing up blood, [ ]  Asthma, [ ]  Wheezing  Musculoskeletal: [ ]  Arthritis, [ ]  Joint pain, [ ]  Muscle pain  Cardiac: [ ]  Chest pain, [ ]  Chest tightness/pressure, [ ]  Shortness of breath when lying flat, [ ]  Shortness of breath with exertion, [ ]  Palpitations, [ ]  Heart murmur, [ ]  Arrythmia, [ ]  Atrial fibrillation  Hematologic: [ ]  Bleeding problems, [ ]  Clotting disorder, [ ]  Anemia  Psychiatric:  [ ]  Depression, [ ]  Anxiety, [ ]  Attention deficit disorder  Gastrointestinal:  [ ]  Black stool,[ ]   Blood in stool, [ ]  Peptic ulcer disease, [ ]  Reflux, [ ]  Hiatal hernia, [ ]  Trouble swallowing, [ ]  Diarrhea, [ ]  Constipation  Urinary:  [ ]  Kidney disease, [ ]  Burning with urination, [ ]  Frequent urination, [ ]  Difficulty urinating  Skin: [x]  Ulcers, [ ]  Rashes  Physical Examination  Filed Vitals:   10/05/11 1011  BP: 122/87  Pulse: 76  Temp: 98.1 F (36.7 C)  TempSrc: Oral  Height: 5\' 7"  (1.702 m)  Weight: 220 lb (99.791 kg)   Body mass index is 34.46 kg/(m^2).   General: A&O x 3, WDWN  Eyes: PERRLA,  EOMI  Pulmonary: Sym exp, good air movt, CTAB, no rales, rhonchi, & wheezing  Cardiac: RRR, Nl S1, S2, no Murmurs, rubs or gallops  Vascular: Vessel Right Left  Radial Palpable Palpable  Brachial Palpable Palpable  Carotid Palpable, without bruit Palpable, without bruit  Aorta Non-palpable N/A  Femoral Palpable Palpable  Popliteal Non-palpable Non-palpable  PT Non-Palpable Non-Palpable  DP Non-Palpable Non-Palpable   Gastrointestinal: soft, NTND, -G/R, - HSM, - masses, - CVAT B  Musculoskeletal: M/S 5/5 R side, 2-3/5 L side: able to lift leg against gravity and flex knee somewhat, Extremities without ischemic changes except left great toe ulcer with obvious tissue loss and eschar, 2nd toe previously amputation, 3rd toe with eschar at joint, 4th toe with lateral eschar, 5th toe with extensive dry gangrene, lateral malleolus ulcer with eschar  Neurologic: CN 2-12 intact except decrease shoulder shrug, Pain and light touch intact in extremities , Motor exam as listed above  Medical Decision Making  Sabria I Fontanella is a 51 y.o. female who presents with: ischemic left foot s/p trauma, improving neurologic function in left leg   Based on the patient's vascular studies and examination, I have offered the patient: Aortogram, bilateral  leg runoff, possible L side intervention, scheduled for this coming Thursday 25th April 2013.  I still think this patient is best off with L BKA but I don't think its unreason to perform an angiogram on the L side to determine the status of the vasculature on that side.  This may help convince the family to proceed with the BKA.  I suspect too much tissue loss is present to salvage a TMA.    I don't think the risk of a surgical bypass is indicated when a L BKA is planned.  Subsequently, I think maximal effort at an endovascular intervention is recommended.  I discussed in depth with the patient the nature of atherosclerosis, and emphasized the importance of  maximal medical management including strict control of blood pressure, blood glucose, and lipid levels, antiplatelet agents, obtaining regular exercise, and cessation of smoking.  The patient is aware that without maximal medical management the underlying atherosclerotic disease process will progress, limiting the benefit of any interventions.  Thank you for allowing [place] to participate in this patient's care.  Adele Barthel, MD [facility] [place] Office: [phone] Pager: [phone]  10/05/2011, 5:46 PM

## 2011-10-12 LAB — POCT I-STAT, CHEM 8
Calcium, Ion: 1.23 mmol/L (ref 1.12–1.32)
Creatinine, Ser: 1.1 mg/dL (ref 0.50–1.10)
Glucose, Bld: 106 mg/dL — ABNORMAL HIGH (ref 70–99)
HCT: 39 % (ref 36.0–46.0)
Hemoglobin: 13.3 g/dL (ref 12.0–15.0)
Potassium: 6.3 mEq/L (ref 3.5–5.1)
TCO2: 28 mmol/L (ref 0–100)

## 2011-10-15 ENCOUNTER — Encounter: Payer: Self-pay | Admitting: *Deleted

## 2011-10-15 ENCOUNTER — Other Ambulatory Visit: Payer: Self-pay | Admitting: *Deleted

## 2011-10-15 ENCOUNTER — Encounter (HOSPITAL_COMMUNITY): Payer: Self-pay | Admitting: Pharmacy Technician

## 2011-10-18 ENCOUNTER — Ambulatory Visit (HOSPITAL_COMMUNITY)
Admission: RE | Admit: 2011-10-18 | Discharge: 2011-10-18 | Disposition: A | Discharge status: Home / Self Care | Payer: Medicaid Other | Source: Ambulatory Visit | Attending: Vascular Surgery | Admitting: Vascular Surgery

## 2011-10-18 ENCOUNTER — Encounter (HOSPITAL_COMMUNITY)
Admission: RE | Disposition: A | Discharge status: Home / Self Care | Payer: Self-pay | Source: Ambulatory Visit | Attending: Vascular Surgery

## 2011-10-18 DIAGNOSIS — E785 Hyperlipidemia, unspecified: Secondary | ICD-10-CM | POA: Insufficient documentation

## 2011-10-18 DIAGNOSIS — I70269 Atherosclerosis of native arteries of extremities with gangrene, unspecified extremity: Secondary | ICD-10-CM

## 2011-10-18 DIAGNOSIS — E119 Type 2 diabetes mellitus without complications: Secondary | ICD-10-CM | POA: Insufficient documentation

## 2011-10-18 DIAGNOSIS — I10 Essential (primary) hypertension: Secondary | ICD-10-CM | POA: Insufficient documentation

## 2011-10-18 HISTORY — PX: LOWER EXTREMITY ANGIOGRAM: SHX5508

## 2011-10-18 HISTORY — PX: ABDOMINAL AORTAGRAM: SHX5454

## 2011-10-18 LAB — POCT I-STAT, CHEM 8
Calcium, Ion: 1.22 mmol/L (ref 1.12–1.32)
Chloride: 112 mEq/L (ref 96–112)
Creatinine, Ser: 1.1 mg/dL (ref 0.50–1.10)
Glucose, Bld: 90 mg/dL (ref 70–99)
Potassium: 4.6 mEq/L (ref 3.5–5.1)

## 2011-10-18 LAB — PROTIME-INR: INR: 1.05 (ref 0.00–1.49)

## 2011-10-18 SURGERY — ABDOMINAL AORTAGRAM
Anesthesia: LOCAL

## 2011-10-18 MED ORDER — SODIUM CHLORIDE 0.9 % IV SOLN
INTRAVENOUS | Status: DC
  Administered 2011-10-18: 11:00:00 via INTRAVENOUS

## 2011-10-18 MED ORDER — MORPHINE SULFATE 4 MG/ML IJ SOLN
2.0000 mg | INTRAMUSCULAR | Status: DC | PRN
  Administered 2011-10-18: 2 mg via INTRAVENOUS

## 2011-10-18 MED ORDER — LABETALOL HCL 5 MG/ML IV SOLN
INTRAVENOUS | Status: AC
  Filled 2011-10-18: qty 4

## 2011-10-18 MED ORDER — MIDAZOLAM HCL 2 MG/2ML IJ SOLN
INTRAMUSCULAR | Status: AC
  Filled 2011-10-18: qty 2

## 2011-10-18 MED ORDER — MORPHINE SULFATE 4 MG/ML IJ SOLN
INTRAMUSCULAR | Status: AC
  Filled 2011-10-18: qty 1

## 2011-10-18 MED ORDER — LIDOCAINE HCL (PF) 1 % IJ SOLN
INTRAMUSCULAR | Status: AC
  Filled 2011-10-18: qty 30

## 2011-10-18 MED ORDER — HEPARIN (PORCINE) IN NACL 2-0.9 UNIT/ML-% IJ SOLN
INTRAMUSCULAR | Status: AC
  Filled 2011-10-18: qty 1000

## 2011-10-18 MED ORDER — FENTANYL CITRATE 0.05 MG/ML IJ SOLN
INTRAMUSCULAR | Status: AC
  Filled 2011-10-18: qty 2

## 2011-10-18 NOTE — Discharge Instructions (Signed)

## 2011-10-18 NOTE — Interval H&P Note (Signed)
[facility] [place]  History and Physical Update  The patient was interviewed and re-examined.  The patient's previous History and Physical has been reviewed and is unchanged.   The patient was scheduled for one week ago, but her elevated INR resulted in the delay.  There is no change in the plan of care.  Adele Barthel, MD [facility] [place] Office: [phone] Pager: [phone]  10/18/2011, 8:34 AM

## 2011-10-18 NOTE — H&P (View-Only) (Signed)
[facility] [place]  Established Critical Limb Ischemia Patient  History of Present Illness  [name] is a 51 y.o. (1960-12-06) female who presents with chief complaint: left foot gangrene.  The patient has dry gangrene wounds in left foot which by report was going to undergo a TMA or BKA but was canceled due to high INR.  The patient's ambulation is limited by her CVA.  The patient's treatment regimen currently included: maximal medical management and local wound care.  The family would like a second opinion in regards to the amputation.  Past Medical History  Diagnosis Date  . HTN (hypertension)   . HLD (hyperlipidemia)   . History of methicillin resistant staphylococcus aureus (MRSA)   . CVA (cerebral infarction)   . Anemia   . Tachycardia   . Obesity   . Osteomyelitis   . Lower back pain     Chronic  . Idiopathic peripheral neuropathy     NOS  . DJD (degenerative joint disease)   . Headache   . GERD (gastroesophageal reflux disease)   . Depression   . Anxiety   . Hypokalemia     resolved  . Cerebral infarct     left parietal and bilateral  . Esophagitis     distal  . Fluid overload     compensated  . Urinary retention   . H/O: pneumonia   . History of bacteremia   . Surgery, other elective     of the ear  . Stroke     Hx of left frontoparietal stroke in 12/11. History of right brain stroke.  . Cardiomyopathy     Echocardiogram 04/23/11: Mild LVH, EF 30-35%, mild MR, mild LAE, mild-moderate RVE.  TEE 04/25/11: Moderate LVH, severe biventricular dysfunction, EF 20-25%, moderate to severe MR, moderate TR, biatrial enlargement, no PFO, negative bubble study  . Shortness of breath     with exertion   . Sleep apnea     no machine used  pt does not remember test for sleep apnea  . DM type 2 (diabetes mellitus, type 2)     type two   oral meds only   . 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 Tympanoplasty   . Toe amputation     left 2nd toe  . Dilation and curettage of uterus     history  . Tee without cardioversion 04/25/2011    Procedure: TRANSESOPHAGEAL ECHOCARDIOGRAM (TEE);  Surgeon: Jolaine Artist, MD;  Location: [facility] ENDOSCOPY;  Service: Cardiovascular;  Laterality: N/A;  . Esophagogastroduodenoscopy 08/14/2011    Procedure: ESOPHAGOGASTRODUODENOSCOPY (EGD);  Surgeon: Scarlette Shorts, MD;  Location: [facility] ENDOSCOPY;  Service: Endoscopy;  Laterality: N/A;    History   Social History  . Marital Status: Divorced    Spouse Name: N/A    Number of Children: N/A  . Years of Education: N/A   Occupational History  . Not on file.   Social History Main Topics  . Smoking status: Never Smoker   . Smokeless tobacco: Never Used  . Alcohol Use: Yes     rare on holidays  . Drug Use: No  . Sexually Active: Not Currently   Other Topics Concern  . Not on file   Social History Narrative   DivorcedOne of 10 children5 in [place] and was living with sister at time of CVANonsmokerNondrinker    Family History  Problem Relation Age of Onset  . Coronary artery disease    . Diabetes    . Cancer Mother   . Diabetes Mother   . Heart disease Father   . Hyperlipidemia Father   . Hypertension Father   . Stroke Father   . Heart disease Brother   . Anesthesia problems Neg Hx   . Hypotension Neg Hx   . Malignant hyperthermia Neg Hx   . Pseudochol deficiency Neg Hx     No current facility-administered medications on file prior to visit.   Current Outpatient Prescriptions on File Prior to Visit  Medication Sig Dispense Refill  . acetaminophen (TYLENOL) 325 MG tablet Take 650 mg by mouth every 4 (four) hours as needed. For pain      . ALPRAZolam (XANAX) 0.25 MG tablet Take 0.25 mg by mouth 3 (three) times daily as needed. For anxiety      . Alum & Mag Hydroxide-Simeth (MAALOX PLUS PO) Take 30 mLs by mouth 4 (four) times daily as needed. For indigestion.      Marland Kitchen amLODipine (NORVASC) 5 MG  tablet Take 5 mg by mouth daily.      Marland Kitchen antiseptic oral rinse (BIOTENE) LIQD 15 mLs by Mouth Rinse route 2 (two) times daily. Swish and Spit      . atorvastatin (LIPITOR) 20 MG tablet Take 20 mg by mouth at bedtime.      . citalopram (CELEXA) 10 MG tablet Take 10 mg by mouth daily.       . cloNIDine (CATAPRES) 0.1 MG tablet Take 0.1 mg by mouth every 8 (eight) hours as needed. Elevated blood pressure      . cycloSPORINE (RESTASIS) 0.05 % ophthalmic emulsion Place 1 drop into both eyes 2 (two) times daily.       . ferrous sulfate 325 (65 FE) MG tablet Take 325 mg by mouth 2 (two) times daily.       Marland Kitchen glimepiride (AMARYL) 2 MG tablet Take 2 mg by mouth daily before breakfast.       . HYDROcodone-acetaminophen (NORCO) 5-325 MG per tablet Take 1 tablet by mouth every 4 (four) hours as needed. For pain      . insulin aspart (NOVOLOG) 100 UNIT/ML injection Inject 5 Units into the skin 4 (four) times daily -  with meals and at bedtime. If CBG >150      . losartan (COZAAR) 100 MG tablet Take 100 mg by mouth daily.       . methocarbamol (ROBAXIN) 500 MG tablet Take 500 mg by mouth every 6 (six) hours as needed. For muscle spasms.      . methylphenidate (RITALIN) 10 MG tablet Take 10 mg by mouth daily.       . metoprolol (LOPRESSOR) 100 MG tablet Take 100 mg by mouth 2 (two) times daily.      . Multiple Vitamins-Minerals (MULTIVITAMINS THER. W/MINERALS) TABS Take 2 tablets by mouth daily.      Marland Kitchen neomycin-bacitracin-polymyxin (NEOSPORIN) OINT Apply 1 application topically daily. To foot and toes on left foot      . nitrofurantoin, macrocrystal-monohydrate, (MACROBID) 100 MG capsule Take 100 mg by mouth daily.       . vitamin C (ASCORBIC ACID) 500 MG tablet Take 500 mg by mouth 2 (two) times daily.      Marland Kitchen DISCONTD: furosemide (LASIX) 20 MG tablet Take 20 mg by mouth daily.        Marland Kitchen DISCONTD: potassium chloride SA (K-DUR,KLOR-CON) 20 MEQ tablet Take  20 mEq by mouth daily.      Marland Kitchen DISCONTD: promethazine  (PHENERGAN) 25 MG/ML injection Inject 25 mg into the muscle every 6 (six) hours as needed. For nausea/vomiting        Allergies  Allergen Reactions  . Ace Inhibitors Shortness Of Breath  . Lisinopril Other (See Comments)    Per MAR  . Vancomycin Other (See Comments)    Per MAR    Review of Systems (Positive items checked otherwise negative)  General: [ ]  Weight loss, [ ]  Weight gain, [ ]   Loss of appetite, [ ]  Fever  Neurologic: [ ]  Dizziness, [ ]  Blackouts, [ ]  Headaches, [ ]  Seizure  Ear/Nose/Throat: [x]  Change in eyesight, [ ]  Change in hearing, [ ]  Nose bleeds, [ ]  Sore throat  Vascular: [x]  Pain in legs with walking, [x]  Pain in feet while lying flat, [x]  Non-healing ulcer, [x]  Stroke, [ ]  "Mini stroke", [ ]  Slurred speech, [x]  Temporary blindness, [ ]  Blood clot in vein, [ ]  Phlebitis  Pulmonary: [ ]  Home oxygen, [ ]  Productive cough, [ ]  Bronchitis, [ ]  Coughing up blood, [ ]  Asthma, [ ]  Wheezing  Musculoskeletal: [ ]  Arthritis, [ ]  Joint pain, [ ]  Muscle pain  Cardiac: [ ]  Chest pain, [ ]  Chest tightness/pressure, [ ]  Shortness of breath when lying flat, [ ]  Shortness of breath with exertion, [ ]  Palpitations, [ ]  Heart murmur, [ ]  Arrythmia, [ ]  Atrial fibrillation  Hematologic: [ ]  Bleeding problems, [ ]  Clotting disorder, [ ]  Anemia  Psychiatric:  [ ]  Depression, [ ]  Anxiety, [ ]  Attention deficit disorder  Gastrointestinal:  [ ]  Black stool,[ ]   Blood in stool, [ ]  Peptic ulcer disease, [ ]  Reflux, [ ]  Hiatal hernia, [ ]  Trouble swallowing, [ ]  Diarrhea, [ ]  Constipation  Urinary:  [ ]  Kidney disease, [ ]  Burning with urination, [ ]  Frequent urination, [ ]  Difficulty urinating  Skin: [x]  Ulcers, [ ]  Rashes  Physical Examination  Filed Vitals:   10/05/11 1011  BP: 122/87  Pulse: 76  Temp: 98.1 F (36.7 C)  TempSrc: Oral  Height: 5\' 7"  (1.702 m)  Weight: 220 lb (99.791 kg)   Body mass index is 34.46 kg/(m^2).   General: A&O x 3, WDWN  Eyes: PERRLA,  EOMI  Pulmonary: Sym exp, good air movt, CTAB, no rales, rhonchi, & wheezing  Cardiac: RRR, Nl S1, S2, no Murmurs, rubs or gallops  Vascular: Vessel Right Left  Radial Palpable Palpable  Brachial Palpable Palpable  Carotid Palpable, without bruit Palpable, without bruit  Aorta Non-palpable N/A  Femoral Palpable Palpable  Popliteal Non-palpable Non-palpable  PT Non-Palpable Non-Palpable  DP Non-Palpable Non-Palpable   Gastrointestinal: soft, NTND, -G/R, - HSM, - masses, - CVAT B  Musculoskeletal: M/S 5/5 R side, 2-3/5 L side: able to lift leg against gravity and flex knee somewhat, Extremities without ischemic changes except left great toe ulcer with obvious tissue loss and eschar, 2nd toe previously amputation, 3rd toe with eschar at joint, 4th toe with lateral eschar, 5th toe with extensive dry gangrene, lateral malleolus ulcer with eschar  Neurologic: CN 2-12 intact except decrease shoulder shrug, Pain and light touch intact in extremities , Motor exam as listed above  Medical Decision Making  [name] is a 51 y.o. female who presents with: ischemic left foot s/p trauma, improving neurologic function in left leg   Based on the patient's vascular studies and examination, I have offered the patient: Aortogram, bilateral  leg runoff, possible L side intervention, scheduled for this coming Thursday 25th April 2013.  I still think this patient is best off with L BKA but I don't think its unreason to perform an angiogram on the L side to determine the status of the vasculature on that side.  This may help convince the family to proceed with the BKA.  I suspect too much tissue loss is present to salvage a TMA.    I don't think the risk of a surgical bypass is indicated when a L BKA is planned.  Subsequently, I think maximal effort at an endovascular intervention is recommended.  I discussed in depth with the patient the nature of atherosclerosis, and emphasized the importance of  maximal medical management including strict control of blood pressure, blood glucose, and lipid levels, antiplatelet agents, obtaining regular exercise, and cessation of smoking.  The patient is aware that without maximal medical management the underlying atherosclerotic disease process will progress, limiting the benefit of any interventions.  Thank you for allowing [place] to participate in this patient's care.  Adele Barthel, MD [facility] [place] Office: [phone] Pager: [phone]  10/05/2011, 5:46 PM

## 2011-10-18 NOTE — Op Note (Signed)
OPERATIVE NOTE   PROCEDURE: 1.  Right common femoral artery cannulation under ultrasound guidance 2.  Aortogram 3.  Second order arterial selection 4.  Right leg runoff 5.  Left leg runoff  PRE-OPERATIVE DIAGNOSIS: Left foot gangrene  POST-OPERATIVE DIAGNOSIS: same as above   SURGEON: Adele Barthel, MD  ANESTHESIA: conscious sedation  ESTIMATED BLOOD LOSS: 50 cc  CONTRAST: 165 cc  FINDING(S):  Aorta: widely patent  Superior mesenteric artery: widely patent Celiac artery: not visualized  Right Left  RA Widely patent Widely patent  CIA Widely patent Widely patent  EIA Widely patent Widely patent  IIA Widely patent Widely patent  CFA Widely patent Widely patent  SFA Widely patent Widely patent but calcified along entire length  PFA Widely patent Widely patent  Pop Widely patent Widely patent  Trif Patent Patent but tibioperoneal trunk occluded  AT Patent, only runoff to foot Patent down to ankle  Pero Patent but attentuates distally Occluded  PT Occluded Occluded  R foot: AT runoff, washout of dye limits visualization of flow in this foot L foot: multiple high grade stenoses and occlusions in dorsalis pedis (~1 mm), collaterals perfuses foot but no arch perfusion noted  SPECIMEN(S):  none  INDICATIONS:   [name] is a 51 y.o. female who presents with left foot gangrene.  The patient wanted consideration for revascularization of her right foot.  She has regained some motor function in this left leg after her stroke, so I felt diagnostic imaging was indicated.  The patient presents for: aortogram, bilateral leg runoff, and possible intervention.  I discussed with the patient the nature of angiographic procedures, especially the limited patencies of any endovascular intervention.  The patient is aware of that the risks of an angiographic procedure include but are not limited to: bleeding, infection, access site complications, renal failure, embolization, rupture of vessel,  dissection, possible need for emergent surgical intervention, possible need for surgical procedures to treat the patient's pathology, and stroke and death.  The patient is aware of the risks and agrees to proceed.  DESCRIPTION: After full informed consent was obtained from the patient, the patient was brought back to the angiography suite.  The patient was placed supine upon the angiography table and connected to monitoring equipment.  The patient was then given conscious sedation, the amounts of which are documented in the patient's chart.  The patient was prepped and drape in the standard fashion for an angiographic procedure.  At this point, attention was turned to the right groin.  Under ultrasound guidance, the right common femoral artery will be cannulated with a 18 gauge needle.  The [facility] wire was passed up into the aorta.  The needle was exchanged for a 5-Fr sheath, which was advanced over the wire into the common femoral artery.  The dilator was then removed.  The Omniflush catheter was then loaded over the wire up to the level of L1.  The catheter was connected to the power injector circuit.  After de-airring and de-clotting the circuit, a power injector aortogram was completed.  I then pulled down the catheter to proximal to the aortic bifurcation.  An automated bilateral leg runoff was completed.  Unfortunately, with the table movements, the catheter was pulled more into the right common iliac artery, delaying the blood flow into the left leg.  The distal right leg imaging was inadequate due to patient movement.  The [facility] wire was replaced in the catheter, and using the [place] and Omniflush catheter, the left  common iliac artery was selected.  The wire was advanced into the external iliac artery.  The catheter was exchanged for a end-hole catheter which was lodged into the left external iliac artery.  The catheter was connected to the power injector circuit.  After de-airring and de-clotting the  circuit, an automated left leg runoff was completed.   Based on the images, no endovascular intervention is possible.  I pulled out the catheter and aspirated the right femoral sheath.  I connected the side port of the sheath to the power injector circuit.  I did repeat imaging from right tibial plateau down via the right sheath.  No immediate intervention is needed on this side.  The sheath was aspirated.  No clots were present and the sheath was reloaded with heparinized saline.  The sheath was pulled on the table and pressure held for 20 minutes.  Based on the images in this study, this patient needs to proceed with her left below-knee amputation with Dr. Sharol Given.  COMPLICATIONS: none  CONDITION: stable  Adele Barthel, MD [facility] Office: [phone] Pager: [phone]  10/18/2011, 12:55 PM

## 2011-10-19 ENCOUNTER — Emergency Department (HOSPITAL_COMMUNITY): Payer: Medicaid Other

## 2011-10-19 ENCOUNTER — Encounter (HOSPITAL_COMMUNITY): Payer: Self-pay | Admitting: Family Medicine

## 2011-10-19 ENCOUNTER — Inpatient Hospital Stay (HOSPITAL_COMMUNITY)
Admission: EM | Admit: 2011-10-19 | Discharge: 2011-10-26 | DRG: 638 | Disposition: A | Discharge status: Skilled Nursing Facility | Payer: Medicaid Other | Attending: Internal Medicine | Admitting: Internal Medicine

## 2011-10-19 ENCOUNTER — Other Ambulatory Visit (HOSPITAL_COMMUNITY): Payer: Self-pay | Admitting: Orthopedic Surgery

## 2011-10-19 DIAGNOSIS — I739 Peripheral vascular disease, unspecified: Secondary | ICD-10-CM

## 2011-10-19 DIAGNOSIS — K219 Gastro-esophageal reflux disease without esophagitis: Secondary | ICD-10-CM | POA: Diagnosis present

## 2011-10-19 DIAGNOSIS — I509 Heart failure, unspecified: Secondary | ICD-10-CM | POA: Diagnosis present

## 2011-10-19 DIAGNOSIS — R062 Wheezing: Secondary | ICD-10-CM

## 2011-10-19 DIAGNOSIS — I70269 Atherosclerosis of native arteries of extremities with gangrene, unspecified extremity: Secondary | ICD-10-CM | POA: Diagnosis present

## 2011-10-19 DIAGNOSIS — N179 Acute kidney failure, unspecified: Secondary | ICD-10-CM

## 2011-10-19 DIAGNOSIS — I998 Other disorder of circulatory system: Secondary | ICD-10-CM

## 2011-10-19 DIAGNOSIS — E1169 Type 2 diabetes mellitus with other specified complication: Principal | ICD-10-CM | POA: Diagnosis present

## 2011-10-19 DIAGNOSIS — I69959 Hemiplegia and hemiparesis following unspecified cerebrovascular disease affecting unspecified side: Secondary | ICD-10-CM

## 2011-10-19 DIAGNOSIS — N390 Urinary tract infection, site not specified: Secondary | ICD-10-CM

## 2011-10-19 DIAGNOSIS — E162 Hypoglycemia, unspecified: Secondary | ICD-10-CM | POA: Diagnosis present

## 2011-10-19 DIAGNOSIS — I5022 Chronic systolic (congestive) heart failure: Secondary | ICD-10-CM

## 2011-10-19 DIAGNOSIS — I129 Hypertensive chronic kidney disease with stage 1 through stage 4 chronic kidney disease, or unspecified chronic kidney disease: Secondary | ICD-10-CM | POA: Diagnosis present

## 2011-10-19 DIAGNOSIS — I69949 Monoplegia of lower limb following unspecified cerebrovascular disease affecting unspecified side: Secondary | ICD-10-CM

## 2011-10-19 DIAGNOSIS — N289 Disorder of kidney and ureter, unspecified: Secondary | ICD-10-CM

## 2011-10-19 DIAGNOSIS — I513 Intracardiac thrombosis, not elsewhere classified: Secondary | ICD-10-CM

## 2011-10-19 DIAGNOSIS — Z8614 Personal history of Methicillin resistant Staphylococcus aureus infection: Secondary | ICD-10-CM

## 2011-10-19 DIAGNOSIS — J9819 Other pulmonary collapse: Secondary | ICD-10-CM | POA: Diagnosis present

## 2011-10-19 DIAGNOSIS — E118 Type 2 diabetes mellitus with unspecified complications: Secondary | ICD-10-CM | POA: Diagnosis present

## 2011-10-19 DIAGNOSIS — N189 Chronic kidney disease, unspecified: Secondary | ICD-10-CM | POA: Diagnosis present

## 2011-10-19 DIAGNOSIS — I10 Essential (primary) hypertension: Secondary | ICD-10-CM

## 2011-10-19 DIAGNOSIS — T383X5A Adverse effect of insulin and oral hypoglycemic [antidiabetic] drugs, initial encounter: Secondary | ICD-10-CM | POA: Diagnosis present

## 2011-10-19 DIAGNOSIS — L97909 Non-pressure chronic ulcer of unspecified part of unspecified lower leg with unspecified severity: Secondary | ICD-10-CM | POA: Diagnosis present

## 2011-10-19 HISTORY — DX: Urinary tract infection, site not specified: N39.0

## 2011-10-19 LAB — DIFFERENTIAL
Lymphocytes Relative: 23 % (ref 12–46)
Lymphs Abs: 2.4 10*3/uL (ref 0.7–4.0)
Monocytes Absolute: 0.9 10*3/uL (ref 0.1–1.0)
Monocytes Relative: 8 % (ref 3–12)
Neutro Abs: 7.1 10*3/uL (ref 1.7–7.7)

## 2011-10-19 LAB — BASIC METABOLIC PANEL
BUN: 39 mg/dL — ABNORMAL HIGH (ref 6–23)
CO2: 24 mEq/L (ref 19–32)
Chloride: 108 mEq/L (ref 96–112)
Creatinine, Ser: 1.42 mg/dL — ABNORMAL HIGH (ref 0.50–1.10)
Glucose, Bld: 112 mg/dL — ABNORMAL HIGH (ref 70–99)

## 2011-10-19 LAB — GLUCOSE, CAPILLARY
Glucose-Capillary: 116 mg/dL — ABNORMAL HIGH (ref 70–99)
Glucose-Capillary: 51 mg/dL — ABNORMAL LOW (ref 70–99)
Glucose-Capillary: 89 mg/dL (ref 70–99)

## 2011-10-19 LAB — CBC
HCT: 32.6 % — ABNORMAL LOW (ref 36.0–46.0)
Hemoglobin: 10.5 g/dL — ABNORMAL LOW (ref 12.0–15.0)
MCV: 90.1 fL (ref 78.0–100.0)
WBC: 10.7 10*3/uL — ABNORMAL HIGH (ref 4.0–10.5)

## 2011-10-19 LAB — PRO B NATRIURETIC PEPTIDE: Pro B Natriuretic peptide (BNP): 1850 pg/mL — ABNORMAL HIGH (ref 0–125)

## 2011-10-19 MED ORDER — LEVOFLOXACIN IN D5W 750 MG/150ML IV SOLN
750.0000 mg | Freq: Once | INTRAVENOUS | Status: AC
  Administered 2011-10-19: 750 mg via INTRAVENOUS
  Filled 2011-10-19: qty 150

## 2011-10-19 MED ORDER — LORAZEPAM 1 MG PO TABS
1.0000 mg | ORAL_TABLET | Freq: Once | ORAL | Status: AC
  Administered 2011-10-19: 1 mg via ORAL
  Filled 2011-10-19: qty 1

## 2011-10-19 MED ORDER — IPRATROPIUM BROMIDE 0.02 % IN SOLN
0.5000 mg | RESPIRATORY_TRACT | Status: DC
  Administered 2011-10-19: 0.5 mg via RESPIRATORY_TRACT
  Filled 2011-10-19: qty 2.5

## 2011-10-19 MED ORDER — WARFARIN SODIUM 5 MG PO TABS
5.0000 mg | ORAL_TABLET | ORAL | Status: AC
  Administered 2011-10-19: 5 mg via ORAL
  Filled 2011-10-19: qty 1

## 2011-10-19 MED ORDER — TOLNAFTATE 1 % EX CREA
TOPICAL_CREAM | Freq: Every day | CUTANEOUS | Status: DC
  Administered 2011-10-20: 1 via TOPICAL
  Administered 2011-10-21 – 2011-10-22 (×2): via TOPICAL
  Filled 2011-10-19 (×2): qty 30

## 2011-10-19 MED ORDER — SIMVASTATIN 20 MG PO TABS
20.0000 mg | ORAL_TABLET | Freq: Every day | ORAL | Status: DC
  Administered 2011-10-20 – 2011-10-26 (×7): 20 mg via ORAL
  Filled 2011-10-19 (×7): qty 1

## 2011-10-19 MED ORDER — ALBUTEROL SULFATE (5 MG/ML) 0.5% IN NEBU
2.5000 mg | INHALATION_SOLUTION | RESPIRATORY_TRACT | Status: DC
  Administered 2011-10-19: 2.5 mg via RESPIRATORY_TRACT
  Filled 2011-10-19: qty 0.5

## 2011-10-19 MED ORDER — WARFARIN - PHARMACIST DOSING INPATIENT: Freq: Every day | Status: DC

## 2011-10-19 MED ORDER — CYCLOSPORINE 0.05 % OP EMUL
1.0000 [drp] | Freq: Two times a day (BID) | OPHTHALMIC | Status: DC
  Administered 2011-10-19 – 2011-10-23 (×7): 1 [drp] via OPHTHALMIC
  Administered 2011-10-23: 01:00:00 via OPHTHALMIC
  Administered 2011-10-23 – 2011-10-25 (×5): 1 [drp] via OPHTHALMIC
  Administered 2011-10-26: 11:00:00 via OPHTHALMIC
  Filled 2011-10-19 (×17): qty 1

## 2011-10-19 MED ORDER — TRAZODONE 25 MG HALF TABLET
25.0000 mg | ORAL_TABLET | Freq: Every evening | ORAL | Status: DC | PRN
  Filled 2011-10-19: qty 1

## 2011-10-19 MED ORDER — IPRATROPIUM BROMIDE 0.02 % IN SOLN: 0.5000 mg | RESPIRATORY_TRACT | Status: DC | PRN

## 2011-10-19 MED ORDER — CITALOPRAM HYDROBROMIDE 10 MG PO TABS
10.0000 mg | ORAL_TABLET | Freq: Every day | ORAL | Status: DC
  Administered 2011-10-20 – 2011-10-26 (×7): 10 mg via ORAL
  Filled 2011-10-19 (×7): qty 1

## 2011-10-19 MED ORDER — HYDROCODONE-ACETAMINOPHEN 5-325 MG PO TABS
1.0000 | ORAL_TABLET | ORAL | Status: DC | PRN
  Administered 2011-10-19 – 2011-10-25 (×9): 1 via ORAL
  Filled 2011-10-19 (×9): qty 1

## 2011-10-19 MED ORDER — AMLODIPINE BESYLATE 5 MG PO TABS
5.0000 mg | ORAL_TABLET | Freq: Every day | ORAL | Status: DC
  Administered 2011-10-20: 5 mg via ORAL
  Filled 2011-10-19: qty 1

## 2011-10-19 MED ORDER — VITAMIN C 500 MG PO TABS
500.0000 mg | ORAL_TABLET | Freq: Two times a day (BID) | ORAL | Status: DC
  Administered 2011-10-19 – 2011-10-26 (×14): 500 mg via ORAL
  Filled 2011-10-19 (×16): qty 1

## 2011-10-19 MED ORDER — ACETAMINOPHEN 325 MG PO TABS: 650.0000 mg | ORAL_TABLET | ORAL | Status: DC | PRN

## 2011-10-19 MED ORDER — ALBUTEROL SULFATE (5 MG/ML) 0.5% IN NEBU: 2.5000 mg | INHALATION_SOLUTION | Freq: Four times a day (QID) | RESPIRATORY_TRACT | Status: DC

## 2011-10-19 MED ORDER — GABAPENTIN 300 MG PO CAPS
600.0000 mg | ORAL_CAPSULE | Freq: Four times a day (QID) | ORAL | Status: DC
  Administered 2011-10-19 – 2011-10-26 (×26): 600 mg via ORAL
  Filled 2011-10-19 (×30): qty 2

## 2011-10-19 MED ORDER — SODIUM CHLORIDE 0.9 % IV SOLN
INTRAVENOUS | Status: DC
  Administered 2011-10-19: 23:00:00 via INTRAVENOUS

## 2011-10-19 MED ORDER — BIOTENE DRY MOUTH MT LIQD
15.0000 mL | Freq: Two times a day (BID) | OROMUCOSAL | Status: DC
  Administered 2011-10-19 – 2011-10-26 (×13): 15 mL via OROMUCOSAL

## 2011-10-19 MED ORDER — INSULIN ASPART 100 UNIT/ML ~~LOC~~ SOLN
0.0000 [IU] | Freq: Three times a day (TID) | SUBCUTANEOUS | Status: DC
  Administered 2011-10-24 (×2): 1 [IU] via SUBCUTANEOUS
  Administered 2011-10-25 (×2): 2 [IU] via SUBCUTANEOUS
  Administered 2011-10-26 (×2): 1 [IU] via SUBCUTANEOUS

## 2011-10-19 MED ORDER — ALPRAZOLAM 0.25 MG PO TABS
0.2500 mg | ORAL_TABLET | Freq: Three times a day (TID) | ORAL | Status: DC | PRN
  Administered 2011-10-19 – 2011-10-25 (×9): 0.25 mg via ORAL
  Filled 2011-10-19 (×9): qty 1

## 2011-10-19 MED ORDER — ONDANSETRON HCL 4 MG PO TABS: 8.0000 mg | ORAL_TABLET | Freq: Three times a day (TID) | ORAL | Status: DC | PRN

## 2011-10-19 MED ORDER — METOPROLOL TARTRATE 100 MG PO TABS
100.0000 mg | ORAL_TABLET | Freq: Two times a day (BID) | ORAL | Status: DC
  Administered 2011-10-19 – 2011-10-26 (×14): 100 mg via ORAL
  Filled 2011-10-19 (×16): qty 1

## 2011-10-19 MED ORDER — METHYLPHENIDATE HCL 5 MG PO TABS
10.0000 mg | ORAL_TABLET | Freq: Two times a day (BID) | ORAL | Status: DC
  Administered 2011-10-19 – 2011-10-26 (×13): 10 mg via ORAL
  Filled 2011-10-19 (×13): qty 2

## 2011-10-19 MED ORDER — FERROUS SULFATE 325 (65 FE) MG PO TABS
325.0000 mg | ORAL_TABLET | Freq: Two times a day (BID) | ORAL | Status: DC
  Administered 2011-10-19 – 2011-10-26 (×14): 325 mg via ORAL
  Filled 2011-10-19 (×16): qty 1

## 2011-10-19 NOTE — Progress Notes (Signed)
ANTICOAGULATION CONSULT NOTE - Initial Consult  Pharmacy Consult for warfarin Indication: CVA  Allergies  Allergen Reactions  . Ace Inhibitors Shortness Of Breath  . Latex Hives  . Lisinopril Other (See Comments)    Per MAR  . Vancomycin Other (See Comments)    Per [facility]    Patient Measurements:   Heparin Dosing Weight:   Vital Signs: Temp: 98.4 F (36.9 C) (05/10 2222) Temp src: Oral (05/10 2222) BP: 126/76 mmHg (05/10 2222) Pulse Rate: 48  (05/10 2222)  Labs:  Basename 10/19/11 1835 10/18/11 1028 10/18/11 0930  HGB 10.5* 12.2 --  HCT 32.6* 36.0 --  PLT 340 -- --  APTT -- -- 37  LABPROT -- -- 13.9  INR -- -- 1.05  HEPARINUNFRC -- -- --  CREATININE 1.42* 1.10 --  CKTOTAL -- -- --  CKMB -- -- --  TROPONINI -- -- --    The CrCl is unknown because both a height and weight (above a minimum accepted value) are required for this calculation.   Medical History: Past Medical History  Diagnosis Date  . HTN (hypertension)   . HLD (hyperlipidemia)   . History of methicillin resistant staphylococcus aureus (MRSA)   . CVA (cerebral infarction)   . Anemia   . Tachycardia   . Obesity   . Osteomyelitis   . Lower back pain     Chronic  . Idiopathic peripheral neuropathy     NOS  . DJD (degenerative joint disease)   . Headache   . GERD (gastroesophageal reflux disease)   . Depression   . Anxiety   . Hypokalemia     resolved  . Cerebral infarct     left parietal and bilateral  . Esophagitis     distal  . Fluid overload     compensated  . Urinary retention   . H/O: pneumonia   . History of bacteremia   . Surgery, other elective     of the ear  . Stroke     Hx of left frontoparietal stroke in 12/11. History of right brain stroke.  . Cardiomyopathy     Echocardiogram 04/23/11: Mild LVH, EF 30-35%, mild MR, mild LAE, mild-moderate RVE.  TEE 04/25/11: Moderate LVH, severe biventricular dysfunction, EF 20-25%, moderate to severe MR, moderate TR, biatrial  enlargement, no PFO, negative bubble study  . Shortness of breath     with exertion   . Sleep apnea     no machine used  pt does not remember test for sleep apnea  . DM type 2 (diabetes mellitus, type 2)     type two   oral meds only   . 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 UTI (lower urinary tract infection) 10/19/11    had approx April 2013    Medications:  Prescriptions prior to admission  Medication Sig Dispense Refill  . acetaminophen (TYLENOL) 325 MG tablet Take 650 mg by mouth every 4 (four) hours as needed. For pain      . ALPRAZolam (XANAX) 0.25 MG tablet Take 0.25 mg by mouth 3 (three) times daily as needed. For anxiety      . Alum & Mag Hydroxide-Simeth (MAALOX PLUS PO) Take 30 mLs by mouth 4 (four) times daily as needed. For indigestion.      Marland Kitchen amLODipine (NORVASC) 5 MG tablet Take 5 mg by mouth daily.      Marland Kitchen antiseptic oral rinse (BIOTENE) LIQD 15 mLs by Mouth  Rinse route 2 (two) times daily.      Marland Kitchen atorvastatin (LIPITOR) 20 MG tablet Take 20 mg by mouth at bedtime.      . citalopram (CELEXA) 10 MG tablet Take 10 mg by mouth daily.       . cloNIDine (CATAPRES) 0.1 MG tablet Take 0.1 mg by mouth every 8 (eight) hours as needed. For blood pressure SBP>OR = 180      . cycloSPORINE (RESTASIS) 0.05 % ophthalmic emulsion Place 1 drop into both eyes 2 (two) times daily.       . ferrous sulfate 325 (65 FE) MG tablet Take 325 mg by mouth 2 (two) times daily.       Marland Kitchen gabapentin (NEURONTIN) 600 MG tablet Take 600 mg by mouth 4 (four) times daily.       Marland Kitchen glimepiride (AMARYL) 2 MG tablet Take 2 mg by mouth daily before breakfast.       . HYDROcodone-acetaminophen (NORCO) 5-325 MG per tablet Take 1 tablet by mouth every 4 (four) hours as needed. For pain      . insulin aspart (NOVOLOG) 100 UNIT/ML injection Inject 5 Units into the skin 4 (four) times daily -  with meals and at bedtime. If CBG >150      . losartan (COZAAR) 100 MG tablet Take 100 mg by mouth daily.       .  methocarbamol (ROBAXIN) 500 MG tablet Take 500 mg by mouth every 6 (six) hours as needed. For muscle spasms.      . methylphenidate (RITALIN) 10 MG tablet Take 10 mg by mouth 2 (two) times daily.       . metoprolol (LOPRESSOR) 100 MG tablet Take 100 mg by mouth 2 (two) times daily.      . Multiple Vitamins-Minerals (MULTIVITAMINS THER. W/MINERALS) TABS Take 2 tablets by mouth daily.      Marland Kitchen neomycin-bacitracin-polymyxin (NEOSPORIN) OINT Apply 1 application topically daily. To foot and toes on left foot      . nitrofurantoin, macrocrystal-monohydrate, (MACROBID) 100 MG capsule Take 100 mg by mouth every 12 (twelve) hours.       . ondansetron (ZOFRAN) 8 MG tablet Take 8 mg by mouth every 12 (twelve) hours as needed. nausea      . TOLNAFTATE ANTIFUNGAL EX Apply topically. toes      . traZODone (DESYREL) 50 MG tablet Take 25-75 mg by mouth at bedtime as needed. For insomnia      . vitamin C (ASCORBIC ACID) 500 MG tablet Take 500 mg by mouth 2 (two) times daily.      Marland Kitchen warfarin (COUMADIN) 4 MG tablet Take 4 mg by mouth daily.         Assessment: Patient on chronic warfarin for hx of CVA.  Warfarin on hold from 4/29 per report.  INR 5/9 WNL. Goal of Therapy:  INR 2-3    Plan:  Warfarin 5mg  po x1 now, daily INR  [name] 10/19/2011,11:04 PM

## 2011-10-19 NOTE — H&P (Signed)
PCP:   Ricke Hey, MD, MD   Chief Complaint:  Low blood sugar  HPI: [name] is a 51/F from SNF with past medical history significant for cardioembolic stroke with L hemiparesis on Coumadin, severe peripheral arterial disease with gangrene of left foot, diabetes, systolic CHF was sent from the skilled nursing facility with the above-mentioned complaint. Most of the history is provided by patient's sister, she reports that this morning when she went to check on her she felt that patient was not as alert as usual, and nurse checked her sugar which was 79, in the interim she had this period of decreased responsiveness, eyes rolling backwards and some jerky movements of her upper extremities which lasted for a couple of minutes and resolved once she was given glucose., then became usual self Patient takes glimepiride for her diabetes, and had taken a tablet this morning. Patient's sister reports that, her by mouth intake yesterday and today was quite poor. She had an aortogram yesterday per Dr. Bridgett Larsson to evaluate the circulation in her left lower extremity, which was felt to be poor, she is asked to followup with Dr. Sharol Given for possible below-knee amputation. Patient denies any fevers or chills. Denies nausea vomiting diarrhea dysuria or urinary frequency. She just completed an antibiotic course for a urinary tract infection. Denies any cough congestion or shortness of breath  Allergies:   Allergies  Allergen Reactions  . Ace Inhibitors Shortness Of Breath  . Latex Hives  . Lisinopril Other (See Comments)    Per MAR  . Vancomycin Other (See Comments)    Per [facility]      Past Medical History  Diagnosis Date  . HTN (hypertension)   . HLD (hyperlipidemia)   . History of methicillin resistant staphylococcus aureus (MRSA)   . CVA (cerebral infarction)   . Anemia   . Tachycardia   . Obesity   . Osteomyelitis   . Lower back pain     Chronic  . Idiopathic peripheral neuropathy     NOS    . DJD (degenerative joint disease)   . Headache   . GERD (gastroesophageal reflux disease)   . Depression   . Anxiety   . Hypokalemia     resolved  . Cerebral infarct     left parietal and bilateral  . Esophagitis     distal  . Fluid overload     compensated  . Urinary retention   . H/O: pneumonia   . History of bacteremia   . Surgery, other elective     of the ear  . Stroke     Hx of left frontoparietal stroke in 12/11. History of right brain stroke.  . Cardiomyopathy     Echocardiogram 04/23/11: Mild LVH, EF 30-35%, mild MR, mild LAE, mild-moderate RVE.  TEE 04/25/11: Moderate LVH, severe biventricular dysfunction, EF 20-25%, moderate to severe MR, moderate TR, biatrial enlargement, no PFO, negative bubble study  . Shortness of breath     with exertion   . Sleep apnea     no machine used  pt does not remember test for sleep apnea  . DM type 2 (diabetes mellitus, type 2)     type two   oral meds only   . 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 UTI (lower urinary tract infection) 10/19/11    had approx April 2013    Past Surgical History  Procedure Date  . Tympanoplasty   . Toe  amputation     left 2nd toe  . Dilation and curettage of uterus     history  . Tee without cardioversion 04/25/2011    Procedure: TRANSESOPHAGEAL ECHOCARDIOGRAM (TEE);  Surgeon: Jolaine Artist, MD;  Location: [facility] ENDOSCOPY;  Service: Cardiovascular;  Laterality: N/A;  . Esophagogastroduodenoscopy 08/14/2011    Procedure: ESOPHAGOGASTRODUODENOSCOPY (EGD);  Surgeon: Scarlette Shorts, MD;  Location: [facility] ENDOSCOPY;  Service: Endoscopy;  Laterality: N/A;    Prior to Admission medications   Medication Sig Start Date End Date Taking? Authorizing Provider  acetaminophen (TYLENOL) 325 MG tablet Take 650 mg by mouth every 4 (four) hours as needed. For pain   Yes Historical Provider, MD  ALPRAZolam (XANAX) 0.25 MG tablet Take 0.25 mg by mouth 3 (three) times daily as needed. For anxiety   Yes  Historical Provider, MD  Alum & Mag Hydroxide-Simeth (MAALOX PLUS PO) Take 30 mLs by mouth 4 (four) times daily as needed. For indigestion.   Yes Historical Provider, MD  amLODipine (NORVASC) 5 MG tablet Take 5 mg by mouth daily.   Yes Historical Provider, MD  antiseptic oral rinse (BIOTENE) LIQD 15 mLs by Mouth Rinse route 2 (two) times daily.   Yes Historical Provider, MD  atorvastatin (LIPITOR) 20 MG tablet Take 20 mg by mouth at bedtime.   Yes Historical Provider, MD  citalopram (CELEXA) 10 MG tablet Take 10 mg by mouth daily.    Yes Historical Provider, MD  cloNIDine (CATAPRES) 0.1 MG tablet Take 0.1 mg by mouth every 8 (eight) hours as needed. For blood pressure SBP>OR = 180   Yes Historical Provider, MD  cycloSPORINE (RESTASIS) 0.05 % ophthalmic emulsion Place 1 drop into both eyes 2 (two) times daily.    Yes Historical Provider, MD  ferrous sulfate 325 (65 FE) MG tablet Take 325 mg by mouth 2 (two) times daily.    Yes Historical Provider, MD  gabapentin (NEURONTIN) 600 MG tablet Take 600 mg by mouth 4 (four) times daily.    Yes Historical Provider, MD  glimepiride (AMARYL) 2 MG tablet Take 2 mg by mouth daily before breakfast.    Yes Historical Provider, MD  HYDROcodone-acetaminophen (NORCO) 5-325 MG per tablet Take 1 tablet by mouth every 4 (four) hours as needed. For pain   Yes Historical Provider, MD  insulin aspart (NOVOLOG) 100 UNIT/ML injection Inject 5 Units into the skin 4 (four) times daily -  with meals and at bedtime. If CBG >150   Yes Historical Provider, MD  losartan (COZAAR) 100 MG tablet Take 100 mg by mouth daily.    Yes Historical Provider, MD  methocarbamol (ROBAXIN) 500 MG tablet Take 500 mg by mouth every 6 (six) hours as needed. For muscle spasms.   Yes Historical Provider, MD  methylphenidate (RITALIN) 10 MG tablet Take 10 mg by mouth 2 (two) times daily.    Yes Historical Provider, MD  metoprolol (LOPRESSOR) 100 MG tablet Take 100 mg by mouth 2 (two) times daily.   Yes  Liliane Shi, PA  Multiple Vitamins-Minerals (MULTIVITAMINS THER. W/MINERALS) TABS Take 2 tablets by mouth daily.   Yes Historical Provider, MD  neomycin-bacitracin-polymyxin (NEOSPORIN) OINT Apply 1 application topically daily. To foot and toes on left foot   Yes Historical Provider, MD  nitrofurantoin, macrocrystal-monohydrate, (MACROBID) 100 MG capsule Take 100 mg by mouth every 12 (twelve) hours.    Yes Historical Provider, MD  ondansetron (ZOFRAN) 8 MG tablet Take 8 mg by mouth every 12 (twelve) hours as needed. nausea  Yes Historical Provider, MD  TOLNAFTATE ANTIFUNGAL EX Apply topically. toes   Yes Historical Provider, MD  traZODone (DESYREL) 50 MG tablet Take 25-75 mg by mouth at bedtime as needed. For insomnia   Yes Historical Provider, MD  vitamin C (ASCORBIC ACID) 500 MG tablet Take 500 mg by mouth 2 (two) times daily.   Yes Historical Provider, MD  warfarin (COUMADIN) 4 MG tablet Take 4 mg by mouth daily.    Yes Historical Provider, MD    Social History:  reports that she has never smoked. She has never used smokeless tobacco. She reports that she drinks alcohol. She reports that she does not use illicit drugs.  Family History  Problem Relation Age of Onset  . Coronary artery disease    . Diabetes    . Cancer Mother   . Diabetes Mother   . Heart disease Father   . Hyperlipidemia Father   . Hypertension Father   . Stroke Father   . Heart disease Brother   . Anesthesia problems Neg Hx   . Hypotension Neg Hx   . Malignant hyperthermia Neg Hx   . Pseudochol deficiency Neg Hx     Review of Systems:  Constitutional: Denies fever, chills, diaphoresis, appetite change and fatigue.  HEENT: Denies photophobia, eye pain, redness, hearing loss, ear pain, congestion, sore throat, rhinorrhea, sneezing, mouth sores, trouble swallowing, neck pain, neck stiffness and tinnitus.   Respiratory: Denies SOB, DOE, cough, chest tightness,  and wheezing.   Cardiovascular: Denies chest pain,  palpitations and leg swelling.  Gastrointestinal: Denies nausea, vomiting, abdominal pain, diarrhea, constipation, blood in stool and abdominal distention.  Genitourinary: Denies dysuria, urgency, frequency, hematuria, flank pain and difficulty urinating.  Musculoskeletal: Denies myalgias, back pain, joint swelling, arthralgias and gait problem.  Skin: Denies pallor, rash and wound.  Neurological: Denies dizziness, seizures, syncope, weakness, light-headedness, numbness and headaches.  Hematological: Denies adenopathy. Easy bruising, personal or family bleeding history  Psychiatric/Behavioral: Denies suicidal ideation, mood changes, confusion, nervousness, sleep disturbance and agitation   Physical Exam: Blood pressure 160/93, pulse 95, temperature 98.4 F (36.9 C), temperature source Oral, resp. rate 20, SpO2 100.00%. GEN: AAOx3 HE ENT: deviated angle of mouth, no JVD CVS: S1S2/RRR Lungs: decreased BS at bases Abd: soft, NT, BS present, no organomegaly, no flank tenderness Ext: Left foot, s/p amputation of multiple toes, 2 ulcers with purulent discharge, diminished pulses Neuro: Lhemiparesis  Labs on Admission:  Results for orders placed during the hospital encounter of 10/19/11 (from the past 48 hour(s))  GLUCOSE, CAPILLARY     Status: Abnormal   Collection Time   10/19/11  5:29 PM      Component Value Range Comment   Glucose-Capillary 110 (*) 70 - 99 (mg/dL)    Comment 1 Documented in Chart      Comment 2 Notify RN     CBC     Status: Abnormal   Collection Time   10/19/11  6:35 PM      Component Value Range Comment   WBC 10.7 (*) 4.0 - 10.5 (K/uL)    RBC 3.62 (*) 3.87 - 5.11 (MIL/uL)    Hemoglobin 10.5 (*) 12.0 - 15.0 (g/dL)    HCT 32.6 (*) 36.0 - 46.0 (%)    MCV 90.1  78.0 - 100.0 (fL)    MCH 29.0  26.0 - 34.0 (pg)    MCHC 32.2  30.0 - 36.0 (g/dL)    RDW 14.7  11.5 - 15.5 (%)    Platelets 340  150 - 400 (K/uL)   DIFFERENTIAL     Status: Normal   Collection Time    10/19/11  6:35 PM      Component Value Range Comment   Neutrophils Relative 67  43 - 77 (%)    Neutro Abs 7.1  1.7 - 7.7 (K/uL)    Lymphocytes Relative 23  12 - 46 (%)    Lymphs Abs 2.4  0.7 - 4.0 (K/uL)    Monocytes Relative 8  3 - 12 (%)    Monocytes Absolute 0.9  0.1 - 1.0 (K/uL)    Eosinophils Relative 2  0 - 5 (%)    Eosinophils Absolute 0.2  0.0 - 0.7 (K/uL)    Basophils Relative 0  0 - 1 (%)    Basophils Absolute 0.0  0.0 - 0.1 (K/uL)   BASIC METABOLIC PANEL     Status: Abnormal   Collection Time   10/19/11  6:35 PM      Component Value Range Comment   Sodium 141  135 - 145 (mEq/L)    Potassium 4.5  3.5 - 5.1 (mEq/L)    Chloride 108  96 - 112 (mEq/L)    CO2 24  19 - 32 (mEq/L)    Glucose, Bld 112 (*) 70 - 99 (mg/dL)    BUN 39 (*) 6 - 23 (mg/dL)    Creatinine, Ser 1.42 (*) 0.50 - 1.10 (mg/dL)    Calcium 8.5  8.4 - 10.5 (mg/dL)    GFR calc non Af Amer 43 (*) >90 (mL/min)    GFR calc Af Amer 49 (*) >90 (mL/min)   PRO B NATRIURETIC PEPTIDE     Status: Abnormal   Collection Time   10/19/11  6:35 PM      Component Value Range Comment   Pro B Natriuretic peptide (BNP) 1850.0 (*) 0 - 125 (pg/mL)   GLUCOSE, CAPILLARY     Status: Abnormal   Collection Time   10/19/11  7:08 PM      Component Value Range Comment   Glucose-Capillary 116 (*) 70 - 99 (mg/dL)    Comment 1 Documented in Chart      Comment 2 Notify RN       Radiological Exams on Admission: Dg Chest 2 View  10/19/2011  *RADIOLOGY REPORT*  Clinical Data: Hypoglycemia.  Hypertension.  CHEST - 2 VIEW  Comparison: Plain films of the chest 09/13/2011.  Findings: Lung volumes are low with crowding of the bronchovascular structures.  Elevation of the right hemidiaphragm is noted.  There is new airspace disease in the left lung base.  IMPRESSION: New left basilar airspace disease could be due to atelectasis or pneumonia.  Original Report Authenticated By: Arvid Right. Luther Parody, M.D.   Ct Head Wo Contrast  10/19/2011  *RADIOLOGY  REPORT*  Clinical Data: Hypoglycemia.  CT HEAD WITHOUT CONTRAST  Technique:  Contiguous axial images were obtained from the base of the skull through the vertex without contrast.  Comparison: Head CT 04/22/2011 and brain MRI 04/23/2011.  Findings: Remote right MCA territory and left parietal infarcts are identified.  No evidence of acute abnormality including infarction, hemorrhage, mass lesion, mass effect, midline shift or abnormal extra-axial fluid collection.  No hydrocephalus or pneumocephalus. Calvarium intact.  IMPRESSION: No acute finding.  Remote bilateral infarcts.  Original Report Authenticated By: Arvid Right. Luther Parody, M.D.    Assessment/Plan 1. Hypoglycemia Secondary to glimepiride, in the background of poor PO intake surrounding yesterdays aortogram. Due to the long half-life of this medication  will admit overnight. I will discontinue glimepiride, check CBG q. a.c. and at bedtime Use sensitive sliding scale with no at bedtime coverage. Check hemoglobin A1c Also check urinalysis 2. Atelectasis. Chest x-ray concerning for atelectasis versus pneumonia However patient completely asymptomatic Will use incentive spirometer Repeat chest x-ray in a.m. I will not start antibiotics tonight 3. history of cardioembolic stroke:/ With residual left hemiparesis/flaccidity Restart Coumadin which had been held for aortogram 4. chronic systolic CHF/EF [id number]: Clinically not volume overloaded, although BNP elevated Gentle IV fluids for tonight, continue beta blocker, hold the ARB 5. acute renal failure/mild: Suspect secondary contrast from aortogram yesterday and ARB, hold losartan, gentle IV fluids Check labs in a.m. 6. severe peripheral arterial disease with gangrene of the left foot and multiple ulcers Status post aortogram yesterday by Dr. Bridgett Larsson, advised to followup with Dr. Sharol Given for evaluation for BKA Continue wound care   Time Spent on Admission: 29min  Sharbel Sahagun [facility] Pager: [phone] 10/19/2011, 9:30 PM

## 2011-10-19 NOTE — ED Notes (Signed)
Per EMS: Pt from [facility]. States pt started looking dazed and having seizure like activity, cbg was 62 at facility and was given glucagon. CBG went up to 81 by EMS arrival and pt was alert and oriented. Pt reports that now she is almost her normal, last cbg 97.  NAD noted at this time.

## 2011-10-19 NOTE — ED Notes (Signed)
Dr. Webb at bedside

## 2011-10-19 NOTE — ED Notes (Signed)
[id number] Expected date:<BR> Expected time: 5:18 PM<BR> Means of arrival:<BR> Comments:<BR> M100 - [name] Hypoglycemic, corrected

## 2011-10-19 NOTE — ED Notes (Signed)
Pt given sandwhich and ginger ale to drink. NAD noted at this time. Family at bedside.

## 2011-10-19 NOTE — ED Provider Notes (Signed)
History     CSN: [id number]  Arrival date & time 10/19/11  1717   First MD Initiated Contact with Patient 10/19/11 1735      Chief Complaint  Patient presents with  . Hypoglycemia    (Consider location/radiation/quality/duration/timing/severity/associated sxs/prior treatment) HPI  32yoF h/o CVA, NIDDM, multiple medical problems pw AMS. Per EMS pt with AMS, staring spell and unresponsive to touch and voice. She had min amount of total body States that glu 62 at that time. Was given glucagon and subsequently alert and oriented. Pt states she does not remember the event. States that at this time she feels "almost back to nl" denies headache/dizziness/cp/sob/f/c.  Gerald Dexter Dupell, RN 10/19/2011 17:25  Per EMS: Pt from [facility]. States pt started looking dazed and having seizure like activity, cbg was 62 at facility and was given glucagon. CBG went up to 81 by EMS arrival and pt was alert and oriented. Pt reports that now she is almost her normal, last cbg 97. NAD noted at this time   Past Medical History  Diagnosis Date  . HTN (hypertension)   . HLD (hyperlipidemia)   . History of methicillin resistant staphylococcus aureus (MRSA)   . CVA (cerebral infarction)   . Anemia   . Tachycardia   . Obesity   . Osteomyelitis   . Lower back pain     Chronic  . Idiopathic peripheral neuropathy     NOS  . DJD (degenerative joint disease)   . Headache   . GERD (gastroesophageal reflux disease)   . Depression   . Anxiety   . Hypokalemia     resolved  . Cerebral infarct     left parietal and bilateral  . Esophagitis     distal  . Fluid overload     compensated  . Urinary retention   . H/O: pneumonia   . History of bacteremia   . Surgery, other elective     of the ear  . Stroke     Hx of left frontoparietal stroke in 12/11. History of right brain stroke.  . Cardiomyopathy     Echocardiogram 04/23/11: Mild LVH, EF 30-35%, mild MR, mild LAE, mild-moderate RVE.  TEE  04/25/11: Moderate LVH, severe biventricular dysfunction, EF 20-25%, moderate to severe MR, moderate TR, biatrial enlargement, no PFO, negative bubble study  . Shortness of breath     with exertion   . Sleep apnea     no machine used  pt does not remember test for sleep apnea  . DM type 2 (diabetes mellitus, type 2)     type two   oral meds only   . 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 UTI (lower urinary tract infection) 10/19/11    had approx April 2013    Past Surgical History  Procedure Date  . Tympanoplasty   . Toe amputation     left 2nd toe  . Dilation and curettage of uterus     history  . Tee without cardioversion 04/25/2011    Procedure: TRANSESOPHAGEAL ECHOCARDIOGRAM (TEE);  Surgeon: Jolaine Artist, MD;  Location: [facility] ENDOSCOPY;  Service: Cardiovascular;  Laterality: N/A;  . Esophagogastroduodenoscopy 08/14/2011    Procedure: ESOPHAGOGASTRODUODENOSCOPY (EGD);  Surgeon: Scarlette Shorts, MD;  Location: [facility] ENDOSCOPY;  Service: Endoscopy;  Laterality: N/A;    Family History  Problem Relation Age of Onset  . Coronary artery disease    . Diabetes    . Cancer  Mother   . Diabetes Mother   . Heart disease Father   . Hyperlipidemia Father   . Hypertension Father   . Stroke Father   . Heart disease Brother   . Anesthesia problems Neg Hx   . Hypotension Neg Hx   . Malignant hyperthermia Neg Hx   . Pseudochol deficiency Neg Hx     History  Substance Use Topics  . Smoking status: Never Smoker   . Smokeless tobacco: Never Used  . Alcohol Use: Yes     rare on holidays    OB History    Grav Para Term Preterm Abortions TAB SAB Ect Mult Living                  Review of Systems  All other systems reviewed and are negative.   except as noted HPI   Allergies  Ace inhibitors; Latex; Lisinopril; and Vancomycin  Home Medications   No current outpatient prescriptions on file.  BP 126/76  Pulse 48  Temp(Src) 98.4 F (36.9 C) (Oral)  Resp 16  SpO2  98%  Physical Exam  Nursing note and vitals reviewed. Constitutional: She is oriented to person, place, and time. She appears well-developed.  HENT:  Head: Atraumatic.  Mouth/Throat: Oropharynx is clear and moist.  Eyes: Conjunctivae and EOM are normal. Pupils are equal, round, and reactive to light.  Neck: Normal range of motion. Neck supple.  Cardiovascular: Normal rate, regular rhythm, normal heart sounds and intact distal pulses.   Pulmonary/Chest: Effort normal. No respiratory distress. She has wheezes. She has no rales.       Diffuse exp wheeze  Abdominal: Soft. She exhibits no distension. There is no tenderness. There is no rebound and no guarding.  Musculoskeletal: Normal range of motion.  Neurological: She is alert and oriented to person, place, and time.  Skin: Skin is warm and dry. No rash noted.  Psychiatric: She has a normal mood and affect.    Date: 10/20/2011  Rate: 94  Rhythm: sinus arrhythmia  QRS Axis: left  Intervals: normal  ST/T Wave abnormalities: nonspecific ST changes  Conduction Disutrbances:none  Narrative Interpretation:   Old EKG Reviewed: no significant change    ED Course  Procedures (including critical care time)  Labs Reviewed  GLUCOSE, CAPILLARY - Abnormal; Notable for the following:    Glucose-Capillary 110 (*)    All other components within normal limits  CBC - Abnormal; Notable for the following:    WBC 10.7 (*)    RBC 3.62 (*)    Hemoglobin 10.5 (*)    HCT 32.6 (*)    All other components within normal limits  BASIC METABOLIC PANEL - Abnormal; Notable for the following:    Glucose, Bld 112 (*)    BUN 39 (*)    Creatinine, Ser 1.42 (*)    GFR calc non Af Amer 43 (*)    GFR calc Af Amer 49 (*)    All other components within normal limits  PRO B NATRIURETIC PEPTIDE - Abnormal; Notable for the following:    Pro B Natriuretic peptide (BNP) 1850.0 (*)    All other components within normal limits  GLUCOSE, CAPILLARY - Abnormal;  Notable for the following:    Glucose-Capillary 116 (*)    All other components within normal limits  GLUCOSE, CAPILLARY - Abnormal; Notable for the following:    Glucose-Capillary 51 (*)    All other components within normal limits  DIFFERENTIAL  GLUCOSE, CAPILLARY  URINALYSIS, ROUTINE W  REFLEX MICROSCOPIC  HEMOGLOBIN A1C  URINALYSIS, ROUTINE W REFLEX MICROSCOPIC  CBC  COMPREHENSIVE METABOLIC PANEL  PROTIME-INR  MRSA PCR SCREENING   Dg Chest 2 View  10/19/2011  *RADIOLOGY REPORT*  Clinical Data: Hypoglycemia.  Hypertension.  CHEST - 2 VIEW  Comparison: Plain films of the chest 09/13/2011.  Findings: Lung volumes are low with crowding of the bronchovascular structures.  Elevation of the right hemidiaphragm is noted.  There is new airspace disease in the left lung base.  IMPRESSION: New left basilar airspace disease could be due to atelectasis or pneumonia.  Original Report Authenticated By: Arvid Right. Luther Parody, M.D.   Ct Head Wo Contrast  10/19/2011  *RADIOLOGY REPORT*  Clinical Data: Hypoglycemia.  CT HEAD WITHOUT CONTRAST  Technique:  Contiguous axial images were obtained from the base of the skull through the vertex without contrast.  Comparison: Head CT 04/22/2011 and brain MRI 04/23/2011.  Findings: Remote right MCA territory and left parietal infarcts are identified.  No evidence of acute abnormality including infarction, hemorrhage, mass lesion, mass effect, midline shift or abnormal extra-axial fluid collection.  No hydrocephalus or pneumocephalus. Calvarium intact.  IMPRESSION: No acute finding.  Remote bilateral infarcts.  Original Report Authenticated By: Arvid Right. D'ALESSIO, M.D.    1. Hypoglycemia   2. Renal insufficiency   3. Wheezing    MDM  Episode of AMS, possible seizure although no history of same. CT head unremarkable. Wheezing noted on exam, not c/o shortness of breath, given systolic heart failure BNP ordered, slightly elevated. CXR with possible pna. Pt without  f/c/cough but will cover in ED with levaquin. Wheezing improved after duoneb although she has no h/o COPD/asthma per family. Mild renal insufficiency noted. Given episode of AMS and borderline glu on glimepiride will admit for observation. [facility].         Blair Heys, MD 10/20/11 [phone]

## 2011-10-20 ENCOUNTER — Inpatient Hospital Stay (HOSPITAL_COMMUNITY): Payer: Medicaid Other

## 2011-10-20 DIAGNOSIS — I69949 Monoplegia of lower limb following unspecified cerebrovascular disease affecting unspecified side: Secondary | ICD-10-CM

## 2011-10-20 DIAGNOSIS — E1169 Type 2 diabetes mellitus with other specified complication: Secondary | ICD-10-CM

## 2011-10-20 DIAGNOSIS — I5022 Chronic systolic (congestive) heart failure: Secondary | ICD-10-CM

## 2011-10-20 DIAGNOSIS — N179 Acute kidney failure, unspecified: Secondary | ICD-10-CM

## 2011-10-20 LAB — URINE MICROSCOPIC-ADD ON

## 2011-10-20 LAB — MRSA PCR SCREENING: MRSA by PCR: NEGATIVE

## 2011-10-20 LAB — GLUCOSE, CAPILLARY: Glucose-Capillary: 78 mg/dL (ref 70–99)

## 2011-10-20 LAB — URINALYSIS, ROUTINE W REFLEX MICROSCOPIC
Bilirubin Urine: NEGATIVE
Ketones, ur: NEGATIVE mg/dL
Nitrite: NEGATIVE
Protein, ur: >300 mg/dL — AB
Urobilinogen, UA: 0.2 mg/dL (ref 0.0–1.0)
pH: 6 (ref 5.0–8.0)

## 2011-10-20 LAB — COMPREHENSIVE METABOLIC PANEL
AST: 24 U/L (ref 0–37)
Albumin: 2.2 g/dL — ABNORMAL LOW (ref 3.5–5.2)
BUN: 42 mg/dL — ABNORMAL HIGH (ref 6–23)
Calcium: 8.6 mg/dL (ref 8.4–10.5)
Chloride: 109 mEq/L (ref 96–112)
Creatinine, Ser: 1.55 mg/dL — ABNORMAL HIGH (ref 0.50–1.10)
Total Bilirubin: 0.3 mg/dL (ref 0.3–1.2)

## 2011-10-20 LAB — CBC
HCT: 30.2 % — ABNORMAL LOW (ref 36.0–46.0)
MCH: 29.3 pg (ref 26.0–34.0)
MCHC: 32.5 g/dL (ref 30.0–36.0)
MCV: 90.4 fL (ref 78.0–100.0)
Platelets: 300 10*3/uL (ref 150–400)
RDW: 14.8 % (ref 11.5–15.5)

## 2011-10-20 LAB — PROTIME-INR
INR: 1.13 (ref 0.00–1.49)
Prothrombin Time: 14.7 seconds (ref 11.6–15.2)

## 2011-10-20 MED ORDER — SODIUM CHLORIDE 0.9 % IV SOLN: INTRAVENOUS | Status: AC

## 2011-10-20 MED ORDER — AMLODIPINE BESYLATE 10 MG PO TABS
10.0000 mg | ORAL_TABLET | Freq: Every day | ORAL | Status: DC
  Administered 2011-10-21 – 2011-10-26 (×6): 10 mg via ORAL
  Filled 2011-10-20 (×6): qty 1

## 2011-10-20 MED ORDER — PRO-STAT SUGAR FREE PO LIQD
30.0000 mL | Freq: Three times a day (TID) | ORAL | Status: DC
  Administered 2011-10-20 – 2011-10-26 (×19): 30 mL via ORAL
  Filled 2011-10-20 (×21): qty 30

## 2011-10-20 MED ORDER — WARFARIN SODIUM 6 MG PO TABS
6.0000 mg | ORAL_TABLET | Freq: Once | ORAL | Status: AC
  Administered 2011-10-20: 6 mg via ORAL
  Filled 2011-10-20: qty 1

## 2011-10-20 MED ORDER — ALBUTEROL SULFATE (5 MG/ML) 0.5% IN NEBU: 2.5000 mg | INHALATION_SOLUTION | RESPIRATORY_TRACT | Status: DC | PRN

## 2011-10-20 MED ORDER — IPRATROPIUM BROMIDE 0.02 % IN SOLN: 0.5000 mg | RESPIRATORY_TRACT | Status: DC | PRN

## 2011-10-20 NOTE — Progress Notes (Signed)
CBG: 64  Treatment: 15 GM carbohydrate snack  Symptoms: None  Follow-up CBG: Time:0807 CBG Result78  Possible Reasons for Event: Unknown  Comments/MD notified:no. Patient had a snack    [name]

## 2011-10-20 NOTE — Progress Notes (Signed)
Subjective: Hypoglycemic event early morning; responded well to crackers and juice; CBG now stable. Patient complaining of some pain on her left foot; otherwise stable and comfortable. No fever, no CP, no SOB.  Objective: Vital signs in last 24 hours: Temp:  [97.7 F (36.5 C)-99.1 F (37.3 C)] 97.7 F (36.5 C) (05/11 0525) Pulse Rate:  [48-95] 83  (05/11 0525) Resp:  [14-20] 18  (05/11 0525) BP: (112-170)/(59-100) 164/100 mmHg (05/11 0525) SpO2:  [97 %-100 %] 100 % (05/11 0525) Weight change:  Last BM Date: 10/19/11  Intake/Output from previous day: 05/10 0701 - 05/11 0700 In: 543.1 [P.O.:36.4; I.V.:356.7; IV Piggyback:150] Out: -  Total I/O In: 360 [P.O.:360] Out: 450 [Urine:450]   Physical Exam: General: Alert, awake, oriented x3, in no acute distress. HEENT: No bruits, no goiter. Heart: S1 and S2;  No murmurs, rubs or gallops. Lungs: Clear to auscultation bilaterally. Abdomen: Soft, nontender, nondistended, positive bowel sounds. Extremities: LLE with clean dressing and protective boot; diminish pulses appreciated; no edema. Neuro:no new deficit. L hemiparesis unchanged from baseline.  Lab Results: Basic Metabolic Panel:  Basename 10/20/11 0400 10/19/11 1835  NA 141 141  K 5.1 4.5  CL 109 108  CO2 24 24  GLUCOSE 80 112*  BUN 42* 39*  CREATININE 1.55* 1.42*  CALCIUM 8.6 8.5  MG -- --  PHOS -- --   Liver Function Tests:  Basename 10/20/11 0400  AST 24  ALT 24  ALKPHOS 90  BILITOT 0.3  PROT 5.9*  ALBUMIN 2.2*   CBC:  Basename 10/20/11 0400 10/19/11 1835  WBC 8.7 10.7*  NEUTROABS -- 7.1  HGB 9.8* 10.5*  HCT 30.2* 32.6*  MCV 90.4 90.1  PLT 300 340   BNP:  Basename 10/19/11 1835  PROBNP 1850.0*   CBG:  Basename 10/20/11 0807 10/20/11 0744 10/19/11 2355 10/19/11 2307 10/19/11 1908 10/19/11 1729  GLUCAP 78 64* 89 51* 116* 110*   Hemoglobin A1C:  Basename 10/19/11 1835  HGBA1C 5.3   Coagulation:  Basename 10/20/11 0400 10/18/11 0930    LABPROT 14.7 13.9  INR 1.13 1.05   Urine Drug Screen: Drugs of Abuse     Component Value Date/Time   LABOPIA NONE DETECTED 04/23/2011 0714   COCAINSCRNUR NONE DETECTED 04/23/2011 0714   LABBENZ NONE DETECTED 04/23/2011 0714   AMPHETMU NONE DETECTED 04/23/2011 0714   THCU NONE DETECTED 04/23/2011 0714   LABBARB NONE DETECTED 04/23/2011 0714    Misc. Labs:  Recent Results (from the past 240 hour(s))  MRSA PCR SCREENING     Status: Normal   Collection Time   10/19/11 11:32 PM      Component Value Range Status Comment   MRSA by PCR NEGATIVE  NEGATIVE  Final     Studies/Results: Dg Chest 2 View  10/20/2011  *RADIOLOGY REPORT*  Clinical Data: Atelectasis versus pneumonia  CHEST - 2 VIEW  Comparison: 10/19/2011  Findings:  Diminished exam detail secondary to motion artifact and patient's body habitus.  There is moderate to marked cardiac enlargement.  Lung volumes are low and there is asymmetric elevation of the right hemidiaphragm.  IMPRESSION:  1.  Diminished exam detail due to motion artifact and body habitus. 2.  Cardiac enlargement. 3.  Low lung volumes.  Original Report Authenticated By: Angelita Ingles, M.D.   Dg Chest 2 View  10/19/2011  *RADIOLOGY REPORT*  Clinical Data: Hypoglycemia.  Hypertension.  CHEST - 2 VIEW  Comparison: Plain films of the chest 09/13/2011.  Findings: Lung volumes are low  with crowding of the bronchovascular structures.  Elevation of the right hemidiaphragm is noted.  There is new airspace disease in the left lung base.  IMPRESSION: New left basilar airspace disease could be due to atelectasis or pneumonia.  Original Report Authenticated By: Arvid Right. Luther Parody, M.D.   Ct Head Wo Contrast  10/19/2011  *RADIOLOGY REPORT*  Clinical Data: Hypoglycemia.  CT HEAD WITHOUT CONTRAST  Technique:  Contiguous axial images were obtained from the base of the skull through the vertex without contrast.  Comparison: Head CT 04/22/2011 and brain MRI 04/23/2011.  Findings:  Remote right MCA territory and left parietal infarcts are identified.  No evidence of acute abnormality including infarction, hemorrhage, mass lesion, mass effect, midline shift or abnormal extra-axial fluid collection.  No hydrocephalus or pneumocephalus. Calvarium intact.  IMPRESSION: No acute finding.  Remote bilateral infarcts.  Original Report Authenticated By: Arvid Right. Luther Parody, M.D.    Medications: Scheduled Meds:   . amLODipine  5 mg Oral Daily  . antiseptic oral rinse  15 mL Mouth Rinse BID  . citalopram  10 mg Oral Daily  . cycloSPORINE  1 drop Both Eyes BID  . ferrous sulfate  325 mg Oral BID  . gabapentin  600 mg Oral QID  . insulin aspart  0-9 Units Subcutaneous TID WC  . levofloxacin (LEVAQUIN) IV  750 mg Intravenous Once  . LORazepam  1 mg Oral Once  . methylphenidate  10 mg Oral BID  . metoprolol  100 mg Oral BID  . simvastatin  20 mg Oral Daily  . tolnaftate   Topical Daily  . vitamin C  500 mg Oral BID  . warfarin  5 mg Oral NOW  . Warfarin - Pharmacist Dosing Inpatient   Does not apply q1800  . DISCONTD: albuterol  2.5 mg Nebulization Q4H  . DISCONTD: albuterol  2.5 mg Nebulization Q6H  . DISCONTD: ipratropium  0.5 mg Nebulization Q4H   Continuous Infusions:   . sodium chloride 50 mL/hr at 10/19/11 2319   PRN Meds:.acetaminophen, albuterol, ALPRAZolam, HYDROcodone-acetaminophen, ipratropium, ondansetron, traZODone, DISCONTD: ipratropium  Assessment/Plan: 1. Hypoglycemia: Secondary to glimepiride, in the background of poor PO intake.  At this point patient eating better and CBG stable. A1C demonstrated to be in 5.3 range. Will discontinue glimeperide and follow diet control. Continue SSI while in the hospital to monitor for CBg fluctuation.  2. Atelectasis. Chest x-ray this morning demonstrating low lung volumes and mild vascular congestion; no infiltrates. Will be cautious with IVF. No antibiotics at this point. Encourage IS use.  3. history of cardioembolic  stroke:/ With residual left hemiparesis/flaccidity. No new deficit; continue secondary prevention with coumadin and control of risk factors.  4. chronic systolic CHF/EF [id number]: Compensated. Close I's and O's; daily weight. Continue beta blocker; ARB on hold due to acute renal failure.  5. acute renal failure/mild: Suspect secondary contrast from aortogram yesterday and ARB, continue holding losartan, gentle IV fluids; Check BMET in a.m.   6. severe peripheral arterial disease with gangrene of the left foot and multiple ulcers  Status post aortogram yesterday by Dr. Bridgett Larsson, advised to followup with Dr. Sharol Given for evaluation for BKA. Continue wound care.  7-Depression: Continue celexa  8-Hypoalbuminemia: will start prostat.  9-HTN: uncontrolled; will increased amlodipine to 10mg  daily.   LOS: 1 day   Zollie Clemence [facility] [phone]  10/20/2011, 10:25 AM

## 2011-10-20 NOTE — Progress Notes (Signed)
ANTICOAGULATION CONSULT NOTE - Follow Up Consult  Pharmacy Consult for Coumadin Indication: H/o cardioembolic stroke w/ L hemiparesis  Allergies  Allergen Reactions  . Ace Inhibitors Shortness Of Breath  . Latex Hives  . Lisinopril Other (See Comments)    Per MAR  . Vancomycin Other (See Comments)    Per MAR   Patient Measurements:    Vital Signs: Temp: 97.7 F (36.5 C) (05/11 0525) Temp src: Oral (05/11 0525) BP: 164/100 mmHg (05/11 0525) Pulse Rate: 83  (05/11 0525)  Labs:  Basename 10/20/11 0400 10/19/11 1835 10/18/11 1028 10/18/11 0930  HGB 9.8* 10.5* -- --  HCT 30.2* 32.6* 36.0 --  PLT 300 340 -- --  APTT -- -- -- 37  LABPROT 14.7 -- -- 13.9  INR 1.13 -- -- 1.05  HEPARINUNFRC -- -- -- --  CREATININE 1.55* 1.42* 1.10 --  CKTOTAL -- -- -- --  CKMB -- -- -- --  TROPONINI -- -- -- --    The CrCl is unknown because both a height and weight (above a minimum accepted value) are required for this calculation.  Assessment:  46 YOF on Chronic warfarin PTA for h/o CVA.  Warfarin on hold from 4/29 per nursing home for aortogram.  Coumadin resumed 5/10.  PTA dose. = 4 mg po daily.   Hgb down to 9.8 yesterday.  INR 1.13. Scr up.    No bleeding/complications noted  Goal of Therapy:  INR 2-3 Monitor platelets by anticoagulation protocol: Yes   Plan:   Coumadin 6 mg po x 1 tonight.    PT/INR daily  Vanessa [employer] Thi 10/20/2011,12:30 PM

## 2011-10-20 NOTE — Progress Notes (Signed)
Left foot dsg. Changed wet to dry.  Patient tolerate well.

## 2011-10-20 NOTE — Progress Notes (Signed)
CBG: 51  Treatment: 15 GM carbohydrate snack  Symptoms: None  Follow-up CBG: Time:2355 CBG Result:89  Possible Reasons for Event: Unknown  Comments/MD notified:not notified. Patient had a sandwich along with graham crackers    Charolette Child

## 2011-10-21 DIAGNOSIS — I5022 Chronic systolic (congestive) heart failure: Secondary | ICD-10-CM

## 2011-10-21 DIAGNOSIS — I69949 Monoplegia of lower limb following unspecified cerebrovascular disease affecting unspecified side: Secondary | ICD-10-CM

## 2011-10-21 DIAGNOSIS — E1169 Type 2 diabetes mellitus with other specified complication: Secondary | ICD-10-CM

## 2011-10-21 DIAGNOSIS — N179 Acute kidney failure, unspecified: Secondary | ICD-10-CM

## 2011-10-21 LAB — GLUCOSE, CAPILLARY
Glucose-Capillary: 90 mg/dL (ref 70–99)
Glucose-Capillary: 118 mg/dL — ABNORMAL HIGH (ref 70–99)

## 2011-10-21 LAB — BASIC METABOLIC PANEL
GFR calc non Af Amer: 28 mL/min — ABNORMAL LOW (ref >90)
Glucose, Bld: 122 mg/dL — ABNORMAL HIGH (ref 70–99)
Potassium: 4.4 mEq/L (ref 3.5–5.1)
Sodium: 140 mEq/L (ref 135–145)

## 2011-10-21 LAB — PROTIME-INR
INR: 1.18 (ref 0.00–1.49)
Prothrombin Time: 15.2 seconds (ref 11.6–15.2)

## 2011-10-21 MED ORDER — WARFARIN SODIUM 6 MG PO TABS
6.0000 mg | ORAL_TABLET | Freq: Once | ORAL | Status: AC
  Administered 2011-10-21: 6 mg via ORAL
  Filled 2011-10-21: qty 1

## 2011-10-21 MED ORDER — SODIUM CHLORIDE 0.9 % IV SOLN
INTRAVENOUS | Status: DC
  Administered 2011-10-21 – 2011-10-22 (×2): via INTRAVENOUS
  Administered 2011-10-22: 75 mL/h via INTRAVENOUS

## 2011-10-21 NOTE — Progress Notes (Signed)
Received call from Dr. Dyann Kief requesting a consultation regarding this patient's chronic left foot ulcerations. Chief complaint persistent left forefoot drainage and open wounds. This patient has been seen previously by Dr. Meridee Score and evaluated for diabetic neurotrophic ulcers involving the left forefoot lateral 3 digits. She showed signs of dysvascular left foot with diminished pulses. Was referred for vascular evaluation and apparently has undergone imaging studies which demonstrated severe left lower extremity peripheral vascular disease that was nonreconstructable. She was apparently referred back to Dr. Meridee Score and has not been seen yet.  The patient was admitted 2 days ago with poor appetite hypoglycemia history of cardioembolic stroke with chronic systolic congestive heart failure ejection fraction 35%. Her hemoglobin A1c is 5.3. She also has mild acute renal insufficiency. Creatinine 2.0.  Orthopedic evaluation: Patient is lying on her left side with the left leg elevated on a pillow. There is discoloration of the left forefoot laterally with dark and skin over the lateral 2 digits. The skin shows poor turgor over the lateral 2 toes with ulcers performed over the lateral aspect of little toe and second to the last toe. Pulses are not palpable. Heel appears viable at does the forefoot medially. She has good sensation to the mid calf. A well-padded AFO is in place with dry dressing. No sign of extending infection.  Diagnosis: Ischemic left foot with primarily lateral forefoot and lateral 2-3 digit early gangrene changes and nonhealing ulcers. No sign of acute infection or ascending cellulitis.  Plan: Will have to Dr. Sharol Given see the patient and evaluate in regards to her left foot and determine if he may be able to perform a left foot partial amputation or below knee amputation. Certainly the more residual limb that is able to be salvaged inserted in the patient's best interest in terms of her  ability to ambulate however she has shown difficulties with healing of the most distal portions of her left foot ulcer condition. Will inform Dr. Sharol Given of the patient's presence in the hospital.

## 2011-10-21 NOTE — Progress Notes (Signed)
ANTICOAGULATION CONSULT NOTE - Follow Up Consult  Pharmacy Consult for Coumadin Indication: H/o cardioembolic stroke w/ L hemiparesis  Allergies  Allergen Reactions  . Ace Inhibitors Shortness Of Breath  . Latex Hives  . Lisinopril Other (See Comments)    Per MAR  . Vancomycin Other (See Comments)    Per [facility]   Patient Measurements: Height: 5\' 7"  (170.2 cm) Weight: 229 lb 8 oz (104.1 kg) IBW/kg (Calculated) : 61.6   Vital Signs: Temp: 98 F (36.7 C) (05/12 0452) Temp src: Oral (05/12 0452) BP: 148/98 mmHg (05/12 0452) Pulse Rate: 83  (05/12 0452)  Labs:  Basename 10/21/11 1205 10/21/11 0343 10/20/11 0400 10/19/11 1835  HGB -- -- 9.8* 10.5*  HCT -- -- 30.2* 32.6*  PLT -- -- 300 340  APTT -- -- -- --  LABPROT 15.2 -- 14.7 --  INR 1.18 -- 1.13 --  HEPARINUNFRC -- -- -- --  CREATININE -- 2.00* 1.55* 1.42*  CKTOTAL -- -- -- --  CKMB -- -- -- --  TROPONINI -- -- -- --    Estimated Creatinine Clearance: 42.2 ml/min (by C-G formula based on Cr of 2).  Assessment:  63 YOF on Chronic warfarin PTA for h/o CVA.  Warfarin on hold from 4/29 per nursing home for aortogram.  Coumadin resumed 5/10.  PTA dose. = 4 mg po daily.  Hgb down to 9.8 on 5/11.  INR 1.18 today as expected with coumadin re-initation. Scr up to 2 today possibly from contrast.  No bleeding/complications noted  Goal of Therapy:  INR 2-3 Monitor platelets by anticoagulation protocol: Yes   Plan:   Repeat Coumadin 6 mg po x 1 tonight.    PT/INR daily  Vanessa [employer] Thi 10/21/2011,12:39 PM

## 2011-10-21 NOTE — Progress Notes (Addendum)
Subjective: No fever, no CP, no SOB. No further hypoglycemia. Eating and drinking ok. Left foot with gangrene appearance and failing healing.   Objective: Vital signs in last 24 hours: Temp:  [97.7 F (36.5 C)-98 F (36.7 C)] 98 F (36.7 C) (05/12 0452) Pulse Rate:  [81-97] 83  (05/12 0452) Resp:  [18-19] 18  (05/12 0452) BP: (137-148)/(85-98) 148/98 mmHg (05/12 0452) SpO2:  [97 %-99 %] 99 % (05/12 0452) Weight:  [104.1 kg (229 lb 8 oz)] 104.1 kg (229 lb 8 oz) (05/12 0601) Weight change:  Last BM Date: 10/19/11  Intake/Output from previous day: 05/11 0701 - 05/12 0700 In: 1186 [P.O.:720; I.V.:466] Out: 450 [Urine:450]     Physical Exam: General: Alert, awake, oriented x3, in no acute distress. HEENT: No bruits, no goiter. Heart: S1 and S2;  No murmurs, rubs or gallops. Lungs: Clear to auscultation bilaterally. Abdomen: Soft, nontender, nondistended, positive bowel sounds. Extremities: LLE s/p amputation, gangrene appearance; no swelling; tender to palpation. Protective boot in place; diminish pulses appreciated. Neuro:no new deficit. L hemiparesis unchanged from baseline.  Lab Results: Basic Metabolic Panel:  Basename 10/21/11 0343 10/20/11 0400  NA 140 141  K 4.4 5.1  CL 109 109  CO2 24 24  GLUCOSE 122* 80  BUN 46* 42*  CREATININE 2.00* 1.55*  CALCIUM 8.2* 8.6  MG -- --  PHOS -- --   Liver Function Tests:  Basename 10/20/11 0400  AST 24  ALT 24  ALKPHOS 90  BILITOT 0.3  PROT 5.9*  ALBUMIN 2.2*   CBC:  Basename 10/20/11 0400 10/19/11 1835  WBC 8.7 10.7*  NEUTROABS -- 7.1  HGB 9.8* 10.5*  HCT 30.2* 32.6*  MCV 90.4 90.1  PLT 300 340   BNP:  Basename 10/19/11 1835  PROBNP 1850.0*   CBG:  Basename 10/21/11 0730 10/20/11 2235 10/20/11 1631 10/20/11 1152 10/20/11 0807 10/20/11 0744  GLUCAP 90 108* 88 125* 78 64*   Hemoglobin A1C:  Basename 10/19/11 1835  HGBA1C 5.3   Coagulation:  Basename 10/20/11 0400 10/18/11 0930  LABPROT 14.7 13.9    INR 1.13 1.05   Urine Drug Screen: Drugs of Abuse     Component Value Date/Time   LABOPIA NONE DETECTED 04/23/2011 0714   COCAINSCRNUR NONE DETECTED 04/23/2011 0714   LABBENZ NONE DETECTED 04/23/2011 0714   AMPHETMU NONE DETECTED 04/23/2011 0714   THCU NONE DETECTED 04/23/2011 0714   [place] DETECTED 04/23/2011 0714    Misc. Labs:  Recent Results (from the past 240 hour(s))  MRSA PCR SCREENING     Status: Normal   Collection Time   10/19/11 11:32 PM      Component Value Range Status Comment   MRSA by PCR NEGATIVE  NEGATIVE  Final     Studies/Results: Dg Chest 2 View  10/20/2011  *RADIOLOGY REPORT*  Clinical Data: Atelectasis versus pneumonia  CHEST - 2 VIEW  Comparison: 10/19/2011  Findings:  Diminished exam detail secondary to motion artifact and patient's body habitus.  There is moderate to marked cardiac enlargement.  Lung volumes are low and there is asymmetric elevation of the right hemidiaphragm.  IMPRESSION:  1.  Diminished exam detail due to motion artifact and body habitus. 2.  Cardiac enlargement. 3.  Low lung volumes.  Original Report Authenticated By: Angelita Ingles, M.D.   Dg Chest 2 View  10/19/2011  *RADIOLOGY REPORT*  Clinical Data: Hypoglycemia.  Hypertension.  CHEST - 2 VIEW  Comparison: Plain films of the chest 09/13/2011.  Findings: Lung  volumes are low with crowding of the bronchovascular structures.  Elevation of the right hemidiaphragm is noted.  There is new airspace disease in the left lung base.  IMPRESSION: New left basilar airspace disease could be due to atelectasis or pneumonia.  Original Report Authenticated By: Arvid Right. Luther Parody, M.D.   Ct Head Wo Contrast  10/19/2011  *RADIOLOGY REPORT*  Clinical Data: Hypoglycemia.  CT HEAD WITHOUT CONTRAST  Technique:  Contiguous axial images were obtained from the base of the skull through the vertex without contrast.  Comparison: Head CT 04/22/2011 and brain MRI 04/23/2011.  Findings: Remote right MCA  territory and left parietal infarcts are identified.  No evidence of acute abnormality including infarction, hemorrhage, mass lesion, mass effect, midline shift or abnormal extra-axial fluid collection.  No hydrocephalus or pneumocephalus. Calvarium intact.  IMPRESSION: No acute finding.  Remote bilateral infarcts.  Original Report Authenticated By: Arvid Right. Luther Parody, M.D.    Medications: Scheduled Meds:    . amLODipine  10 mg Oral Daily  . antiseptic oral rinse  15 mL Mouth Rinse BID  . citalopram  10 mg Oral Daily  . cycloSPORINE  1 drop Both Eyes BID  . feeding supplement  30 mL Oral TID WC  . ferrous sulfate  325 mg Oral BID  . gabapentin  600 mg Oral QID  . insulin aspart  0-9 Units Subcutaneous TID WC  . methylphenidate  10 mg Oral BID  . metoprolol  100 mg Oral BID  . simvastatin  20 mg Oral Daily  . tolnaftate   Topical Daily  . vitamin C  500 mg Oral BID  . warfarin  6 mg Oral ONCE-1800  . Warfarin - Pharmacist Dosing Inpatient   Does not apply q1800  . DISCONTD: amLODipine  5 mg Oral Daily   Continuous Infusions:    . sodium chloride    . sodium chloride    . DISCONTD: sodium chloride 50 mL/hr at 10/19/11 2319   PRN Meds:.acetaminophen, albuterol, ALPRAZolam, HYDROcodone-acetaminophen, ipratropium, ondansetron, traZODone  Assessment/Plan: 1. Hypoglycemia: Secondary to glimepiride, in the background of poor PO intake.  At this point no further hypoglycemia. Patient eating better and CBG stable. A1C demonstrated to be in 5.3 range. Will continue carb modify diet and also continue SSI while in the hospital to monitor for Cbg fluctuation.  2. Atelectasis. Chest x-ray on (10/18/11) demonstrating low lung volumes and mild vascular congestion; no infiltrates. Will be cautious with IVF. No antibiotics at this point. Encourage IS use.  3. history of cardioembolic stroke:/ With residual left hemiparesis/flaccidity. No new deficit; continue secondary prevention with coumadin and  control of risk factors.  4. chronic systolic CHF/EF [id number]: Compensated. Close I's and O's; daily weight. Continue beta blocker; ARB on hold due to acute renal failure. No JVD or sings of fluid overload.  5. acute renal failure/mild: Suspect secondary contrast from aortogram and continue use of ARB, continue holding losartan, continue gentle IV fluids; Check BMET in a.m. Cr 2.0   6. severe peripheral arterial disease with gangrene of the left foot and multiple ulcers  Status post aortogram by Dr. Bridgett Larsson, advised to followup with Dr. Sharol Given for evaluation for BKA. Since patient already admitted and her wound is no getting any better; will consult ortho for probably BKA during this admission.   7-Depression: Continue celexa  8-Hypoalbuminemia: will continue prostat.  9-HTN: Better. Will continue current adjusted regimen.   LOS: 2 days   Jerrika Ledlow [facility] [phone]  10/21/2011, 8:47 AM

## 2011-10-22 DIAGNOSIS — I5022 Chronic systolic (congestive) heart failure: Secondary | ICD-10-CM

## 2011-10-22 DIAGNOSIS — E1169 Type 2 diabetes mellitus with other specified complication: Secondary | ICD-10-CM

## 2011-10-22 DIAGNOSIS — I69949 Monoplegia of lower limb following unspecified cerebrovascular disease affecting unspecified side: Secondary | ICD-10-CM

## 2011-10-22 DIAGNOSIS — N179 Acute kidney failure, unspecified: Secondary | ICD-10-CM

## 2011-10-22 LAB — COMPREHENSIVE METABOLIC PANEL
ALT: 37 U/L — ABNORMAL HIGH (ref 0–35)
AST: 34 U/L (ref 0–37)
Alkaline Phosphatase: 106 U/L (ref 39–117)
CO2: 21 mEq/L (ref 19–32)
Calcium: 8.7 mg/dL (ref 8.4–10.5)
Chloride: 110 mEq/L (ref 96–112)
GFR calc Af Amer: 41 mL/min — ABNORMAL LOW (ref >90)
GFR calc non Af Amer: 35 mL/min — ABNORMAL LOW (ref >90)
Glucose, Bld: 144 mg/dL — ABNORMAL HIGH (ref 70–99)
Potassium: 4 mEq/L (ref 3.5–5.1)
Sodium: 140 mEq/L (ref 135–145)
Total Bilirubin: 0.1 mg/dL — ABNORMAL LOW (ref 0.3–1.2)

## 2011-10-22 LAB — GLUCOSE, CAPILLARY
Glucose-Capillary: 87 mg/dL (ref 70–99)
Glucose-Capillary: 147 mg/dL — ABNORMAL HIGH (ref 70–99)
Glucose-Capillary: 133 mg/dL — ABNORMAL HIGH (ref 70–99)
Glucose-Capillary: 151 mg/dL — ABNORMAL HIGH (ref 70–99)

## 2011-10-22 LAB — CBC
Hemoglobin: 10.7 g/dL — ABNORMAL LOW (ref 12.0–15.0)
MCH: 28.9 pg (ref 26.0–34.0)
Platelets: 338 10*3/uL (ref 150–400)
RBC: 3.7 MIL/uL — ABNORMAL LOW (ref 3.87–5.11)
WBC: 8.6 10*3/uL (ref 4.0–10.5)

## 2011-10-22 LAB — BASIC METABOLIC PANEL
BUN: 50 mg/dL — ABNORMAL HIGH (ref 6–23)
CO2: 22 mEq/L (ref 19–32)
Calcium: 8.5 mg/dL (ref 8.4–10.5)
Creatinine, Ser: 1.84 mg/dL — ABNORMAL HIGH (ref 0.50–1.10)
Glucose, Bld: 103 mg/dL — ABNORMAL HIGH (ref 70–99)

## 2011-10-22 LAB — APTT: aPTT: 42 seconds — ABNORMAL HIGH (ref 24–37)

## 2011-10-22 MED ORDER — WARFARIN SODIUM 6 MG PO TABS
6.0000 mg | ORAL_TABLET | Freq: Once | ORAL | Status: AC
Start: 2011-10-22 — End: 2011-10-22
  Administered 2011-10-22: 6 mg via ORAL
  Filled 2011-10-22: qty 1

## 2011-10-22 NOTE — Consult Note (Signed)
WOC consult Note Reason for Consult:Referral from admitting MD, Dr. Dyann Kief.  Simultaneous consultation made to orthopedics, and Dr. Louanne Skye has seen. Wound type:ischemic foot with no sign of advancing cellulitis (per Dr. Louanne Skye).  Severe PVD that is not reconstructable (per Dr. Louanne Skye).  Dr Louanne Skye to inform Dr. Sharol Given of patient's admission. I will not consult given that her needs exceed the scop of [place].  As noted, a well padded and pressure redistributing boot (Prevalon) is in place. Please re-consult should the need arise. Maudie Flakes, MSN, RN, [place], [place] [phone])

## 2011-10-22 NOTE — Progress Notes (Signed)
Pt is being transported to [facility] room 5006, via stretcher. Given full telephone report to receiving RN, Webb Silversmith. No complaints at the moment. Pt accompanied by relative (sister). All needs met. Safety maintained.

## 2011-10-22 NOTE — Progress Notes (Addendum)
Subjective: No fever, no CP, no SOB. No further hypoglycemia. Eating and drinking ok. Left foot with gangrene appearance and with some pain intermittently.   Objective: Vital signs in last 24 hours: Temp:  [97.7 F (36.5 C)-98.5 F (36.9 C)] 98.3 F (36.8 C) (05/13 0425) Pulse Rate:  [83-93] 86  (05/13 0425) Resp:  [16-18] 16  (05/13 0425) BP: (129-155)/(71-86) 147/86 mmHg (05/13 0425) SpO2:  [95 %-97 %] 97 % (05/13 0425) Weight:  [103.1 kg (227 lb 4.7 oz)] 103.1 kg (227 lb 4.7 oz) (05/13 0425) Weight change: -1 kg (-2 lb 3.3 oz) Last BM Date: 10/21/11  Intake/Output from previous day: 05/12 0701 - 05/13 0700 In: 2147.1 [P.O.:840; I.V.:1307.1] Out: 800 [Urine:800]     Physical Exam: General: Alert, awake, oriented x3, in no acute distress. HEENT: No bruits, no goiter. Heart: S1 and S2;  No murmurs, rubs or gallops. Lungs: Clear to auscultation bilaterally. Abdomen: Soft, nontender, nondistended, positive bowel sounds. Extremities: LLE s/p amputation, gangrene appearance; no swelling; tender to palpation. Protective boot in place; diminish pulses appreciated. Neuro: no new deficit. L hemiparesis unchanged from baseline.  Lab Results: Basic Metabolic Panel:  Basename 10/22/11 0404 10/21/11 0343  NA 142 140  K 4.3 4.4  CL 110 109  CO2 22 24  GLUCOSE 103* 122*  BUN 50* 46*  CREATININE 1.84* 2.00*  CALCIUM 8.5 8.2*  MG -- --  PHOS -- --   Liver Function Tests:  Basename 10/20/11 0400  AST 24  ALT 24  ALKPHOS 90  BILITOT 0.3  PROT 5.9*  ALBUMIN 2.2*   CBC:  Basename 10/20/11 0400 10/19/11 1835  WBC 8.7 10.7*  NEUTROABS -- 7.1  HGB 9.8* 10.5*  HCT 30.2* 32.6*  MCV 90.4 90.1  PLT 300 340   BNP:  Basename 10/19/11 1835  PROBNP 1850.0*   CBG:  Basename 10/22/11 0741 10/21/11 2136 10/21/11 1635 10/21/11 1117 10/21/11 0730 10/20/11 2235  GLUCAP 87 124* 123* 118* 90 108*   Hemoglobin A1C:  Basename 10/19/11 1835  HGBA1C 5.3    Coagulation:  Basename 10/22/11 0404 10/21/11 1205  LABPROT 16.4* 15.2  INR 1.30 1.18   Urine Drug Screen: Drugs of Abuse     Component Value Date/Time   LABOPIA NONE DETECTED 04/23/2011 0714   COCAINSCRNUR NONE DETECTED 04/23/2011 0714   LABBENZ NONE DETECTED 04/23/2011 0714   AMPHETMU NONE DETECTED 04/23/2011 0714   THCU NONE DETECTED 04/23/2011 0714   [place] DETECTED 04/23/2011 0714    Misc. Labs:  Recent Results (from the past 240 hour(s))  MRSA PCR SCREENING     Status: Normal   Collection Time   10/19/11 11:32 PM      Component Value Range Status Comment   MRSA by PCR NEGATIVE  NEGATIVE  Final     Studies/Results: No results found.  Medications: Scheduled Meds:    . amLODipine  10 mg Oral Daily  . antiseptic oral rinse  15 mL Mouth Rinse BID  . citalopram  10 mg Oral Daily  . cycloSPORINE  1 drop Both Eyes BID  . feeding supplement  30 mL Oral TID WC  . ferrous sulfate  325 mg Oral BID  . gabapentin  600 mg Oral QID  . insulin aspart  0-9 Units Subcutaneous TID WC  . methylphenidate  10 mg Oral BID  . metoprolol  100 mg Oral BID  . simvastatin  20 mg Oral Daily  . tolnaftate   Topical Daily  . vitamin C  500 mg Oral BID  . warfarin  6 mg Oral ONCE-1800  . Warfarin - Pharmacist Dosing Inpatient   Does not apply q1800   Continuous Infusions:    . sodium chloride 75 mL/hr at 10/22/11 0010   PRN Meds:.acetaminophen, albuterol, ALPRAZolam, HYDROcodone-acetaminophen, ipratropium, ondansetron, traZODone  Assessment/Plan: 1. Hypoglycemia: Secondary to glimepiride, in the background of poor PO intake.  At this point no further hypoglycemia. Patient eating better and CBG stable. A1C demonstrated to be in 5.3 range (will no required glimepiride at d/c). Will continue carb modify diet and also continue SSI while in the hospital to monitor for Cbg fluctuation.  2. Atelectasis. Will be cautious with IVF. No antibiotics at this point. Continue Encouraging  IS use.  3. history of cardioembolic stroke:/ With residual left hemiparesis/flaccidity. No new deficit; continue secondary prevention with coumadin and control of risk factors.  4. chronic systolic CHF/EF [id number]: Compensated. Close I's and O's; daily weight. Continue beta blocker; ARB on hold due to acute renal failure. No JVD or sings of fluid overload.  5. acute renal failure/mild: Suspect secondary contrast from aortogram and ARB, continue holding losartan, continue gentle IV fluids; Check BMET in a.m. Cr 1.84.   6. severe peripheral arterial disease with gangrene of the left foot and multiple ulcers  Status post aortogram by Dr. Bridgett Larsson, advised to followup with Dr. Sharol Given for evaluation for BKA. Since patient already admitted and her wound is no getting any better; Ortho was consulted and following recommendations they have requested to transfer patient to [facility] cone for surgery in am (10/23/11).  7-Depression: Continue celexa  8-Hypoalbuminemia: will continue prostat.  9-HTN: Better. Will continue current adjusted antihypertensive regimen.   LOS: 3 days   Nance Mccombs [facility] [phone]  10/22/2011, 10:39 AM

## 2011-10-22 NOTE — Progress Notes (Signed)
I spoke to patient's nurse at [facility] - she stated that patient is being transferred to [facility] today.m  If patient has surgery 10/23/11, she will not come through Short Stay.

## 2011-10-22 NOTE — Progress Notes (Signed)
ANTICOAGULATION CONSULT NOTE - Follow Up Consult  Pharmacy Consult for Coumadin Indication: H/o cardioembolic stroke w/ L hemiparesis  Allergies  Allergen Reactions  . Ace Inhibitors Shortness Of Breath  . Latex Hives  . Lisinopril Other (See Comments)    Per MAR  . Vancomycin Other (See Comments)    Per [facility]   Patient Measurements: Height: 5\' 7"  (170.2 cm) Weight: 227 lb 4.7 oz (103.1 kg) IBW/kg (Calculated) : 61.6   Vital Signs: Temp: 98 F (36.7 C) (05/13 1400) Temp src: Oral (05/13 1400) BP: 121/75 mmHg (05/13 1400) Pulse Rate: 83  (05/13 1400)  Labs:  Basename 10/22/11 0404 10/21/11 1205 10/21/11 0343 10/20/11 0400 10/19/11 1835  HGB -- -- -- 9.8* 10.5*  HCT -- -- -- 30.2* 32.6*  PLT -- -- -- 300 340  APTT -- -- -- -- --  LABPROT 16.4* 15.2 -- 14.7 --  INR 1.30 1.18 -- 1.13 --  HEPARINUNFRC -- -- -- -- --  CREATININE 1.84* -- 2.00* 1.55* --  CKTOTAL -- -- -- -- --  CKMB -- -- -- -- --  TROPONINI -- -- -- -- --    Estimated Creatinine Clearance: 45.7 ml/min (by C-G formula based on Cr of 1.84).  Assessment:  40 YOF on Chronic warfarin PTA for h/o CVA.  Warfarin on hold from 4/29 per nursing home for aortogram.  Coumadin resumed 5/10.  PTA dose. = 4 mg po daily.  INR up to 1.3 <1.18  today after 5, 6, 6mg  Coumadin 5/10-13. Scr elevated, 1.84, possibly from contrast.  No bleeding/complications noted  Noted possible plans for surgery. Contacted Dr. Dyann Kief who stated to proceed with anticoagulation as surgery plans are uncertain, waiting for Dr Sharol Given to assess.  Goal of Therapy:  INR 2-3 Monitor platelets by anticoagulation protocol: Yes   Plan:   Repeat Coumadin 6 mg po x 1 tonight.    PT/INR daily  Marcell Anger PharmD  [phone] 10/22/2011 3:27 PM

## 2011-10-22 NOTE — Progress Notes (Signed)
Nutrition Brief Note  - Pt with low braden. Pt screened for unintended weight loss greater than 10 pounds in the past month and dysphagia - both of which pt denies having. Pt reports her weight loss has occurred gradually from 240 pounds in December 2012 to 227 pounds currently r/t poor appetite, however pt does try to eat 3 meals/day - just may eat smaller amounts of food on days when her appetite is poor. Pt missing some teeth but pt and family state she has been chewing well and pt denies any difficulty swallowing. Pt thinks some of her teeth need to be pulled - defer to MD for setting this up as outpatient. Pt reports eating excellent during admission and appetite returning - pt on CHO modified diet with 100% intake documented. No nutrition diagnosis at this time. Will continue to monitor intake.   Dietitian# [phone]

## 2011-10-23 ENCOUNTER — Ambulatory Visit (HOSPITAL_COMMUNITY): Admission: RE | Admit: 2011-10-23 | Payer: Medicaid Other | Source: Ambulatory Visit | Admitting: Orthopedic Surgery

## 2011-10-23 ENCOUNTER — Inpatient Hospital Stay (HOSPITAL_COMMUNITY): Payer: Medicaid Other

## 2011-10-23 ENCOUNTER — Encounter (HOSPITAL_COMMUNITY): Admission: RE | Payer: Self-pay | Source: Ambulatory Visit

## 2011-10-23 DIAGNOSIS — I5022 Chronic systolic (congestive) heart failure: Secondary | ICD-10-CM

## 2011-10-23 DIAGNOSIS — N179 Acute kidney failure, unspecified: Secondary | ICD-10-CM

## 2011-10-23 DIAGNOSIS — I69949 Monoplegia of lower limb following unspecified cerebrovascular disease affecting unspecified side: Secondary | ICD-10-CM

## 2011-10-23 DIAGNOSIS — E1169 Type 2 diabetes mellitus with other specified complication: Secondary | ICD-10-CM

## 2011-10-23 LAB — GLUCOSE, CAPILLARY
Glucose-Capillary: 113 mg/dL — ABNORMAL HIGH (ref 70–99)
Glucose-Capillary: 119 mg/dL — ABNORMAL HIGH (ref 70–99)

## 2011-10-23 LAB — BASIC METABOLIC PANEL
BUN: 53 mg/dL — ABNORMAL HIGH (ref 6–23)
CO2: 20 mEq/L (ref 19–32)
Glucose, Bld: 92 mg/dL (ref 70–99)
Potassium: 4.1 mEq/L (ref 3.5–5.1)
Sodium: 142 mEq/L (ref 135–145)

## 2011-10-23 SURGERY — AMPUTATION BELOW KNEE
Anesthesia: General | Site: Foot | Laterality: Left

## 2011-10-23 MED ORDER — WARFARIN SODIUM 5 MG PO TABS
5.0000 mg | ORAL_TABLET | Freq: Once | ORAL | Status: AC
  Administered 2011-10-23: 5 mg via ORAL
  Filled 2011-10-23: qty 1

## 2011-10-23 MED ORDER — CLONAZEPAM 0.5 MG PO TABS
0.5000 mg | ORAL_TABLET | Freq: Two times a day (BID) | ORAL | Status: DC | PRN
  Administered 2011-10-23 – 2011-10-26 (×6): 0.5 mg via ORAL
  Filled 2011-10-23 (×6): qty 1

## 2011-10-23 MED ORDER — SILVER SULFADIAZINE 1 % EX CREA
TOPICAL_CREAM | Freq: Every day | CUTANEOUS | Status: DC
  Administered 2011-10-23 – 2011-10-26 (×4): via TOPICAL
  Filled 2011-10-23: qty 85

## 2011-10-23 NOTE — Consult Note (Signed)
WOC consult Note Consulted by medical MD for wound care, however review of record that orthopedics have written wound care orders previously and will follow up with pt after 3wks.  Pt was planned for amputation today but has since declined.  Today when I spoke with bedside nursing about consult she reported that pt has decided now to proceed with amputation. WOC will not see pt for this reason.  Requested that bedside nursing discontinue order for consult.  [place], [place]

## 2011-10-23 NOTE — Progress Notes (Signed)
Pt transferred to this CSW and unit 5000 today. Per [facility] CSW, Paulo Fruit has been completed and pt faxed to [place] admitted from [facility] and is able to return; however, pt would like new SNF search. [place] CSW to f/u with offers as available.  Wandra Feinstein, MSW, LCSWA [phone] (ortho coverage)

## 2011-10-23 NOTE — Progress Notes (Signed)
Clinical Social Work Department BRIEF PSYCHOSOCIAL ASSESSMENT 10/23/2011  Patient:  [name], [name]     Account Number:  [id number]     Admit date:  10/19/2011  Clinical Social Worker:  Daryel Gerald, Patrick  Date/Time:  10/22/2011 04:22 PM  Referred by:  Physician  Date Referred:  10/22/2011 Referred for  SNF Placement   Other Referral:   Interview type:  Patient Other interview type:   CSW also spoke with a family member in the room.    PSYCHOSOCIAL DATA Living Status:  ALONE Admitted from facility:   Level of care:   Primary support name:  Rosalind Primary support relationship to patient:  SIBLING Degree of support available:   good    CURRENT CONCERNS Current Concerns  Adjustment to Illness   Other Concerns:    SOCIAL WORK ASSESSMENT / PLAN pt is in need of after care placment.   Assessment/plan status:  Psychosocial Support/Ongoing Assessment of Needs Other assessment/ plan:   Information/referral to community resources:    PATIENT'S/FAMILY'S RESPONSE TO PLAN OF CARE: pt and family aggree to after care placement, however, they would like to look into other SNF facilities. The pt and family perfer not to go back to [place] unless they have to.       [name]. [name] [phone]

## 2011-10-23 NOTE — Progress Notes (Signed)
ANTICOAGULATION CONSULT NOTE - Follow Up Consult  Pharmacy Consult for Coumadin Indication: Hx CVA  Allergies  Allergen Reactions  . Ace Inhibitors Shortness Of Breath  . Latex Hives  . Lisinopril Other (See Comments)    Per MAR  . Vancomycin Other (See Comments)    Per [facility]    Patient Measurements: Height: 5\' 7"  (170.2 cm) Weight: 235 lb 3.7 oz (106.7 kg) IBW/kg (Calculated) : 61.6  Heparin Dosing Weight:   Vital Signs: Temp: 98.3 F (36.8 C) (05/14 0528) BP: 152/88 mmHg (05/14 0528) Pulse Rate: 78  (05/14 0528)  Labs:  Basename 10/23/11 0716 10/22/11 2224 10/22/11 0404 10/21/11 1205  HGB -- 10.7* -- --  HCT -- 33.9* -- --  PLT -- 338 -- --  APTT -- 42* -- --  LABPROT -- 17.7* 16.4* 15.2  INR -- 1.43 1.30 1.18  HEPARINUNFRC -- -- -- --  CREATININE 1.55* 1.66* 1.84* --  CKTOTAL -- -- -- --  CKMB -- -- -- --  TROPONINI -- -- -- --    Estimated Creatinine Clearance: 55.2 ml/min (by C-G formula based on Cr of 1.55).  Assessment: 51yof continuing Coumadin for hx CVA. INR (1.43) is subtherapeutic but trending up (last drawn last PM). Expect INR to continue trending up with 6mg  x 3. Will begin to work back towards PTA regimen (4mg  daily). Per Dr. Jess Barters note, no surgical intervention planned at this time.  - H/H and Plts improving - No significant bleeding reported  Goal of Therapy:  INR 2-3   Plan:  1. Coumadin 5mg  po x 1 today 2. Follow-up AM INR  Earleen Newport [phone] 10/23/2011,11:16 AM

## 2011-10-23 NOTE — Progress Notes (Signed)
[facility]                                                                                 Patient Demographics  [name], is a 51 y.o. female  [id number]  [id number]  DOB - 10/26/1960  Admit date - 10/19/2011  Admitting Physician Thurnell Lose, MD  Outpatient Primary MD for the patient is Ricke Hey, MD, MD  LOS - 4   Assuming care of the patient on 10/23/2011 on day 4 of her hospital stay   Chief Complaint  Patient presents with  . Hypoglycemia        Subjective:   [name] today has, No headache, No chest pain, No abdominal pain - No Nausea, No new weakness tingling or numbness, No Cough - SOB. She has decided not to undergo left BKA.  Objective:   Filed Vitals:   10/22/11 1400 10/22/11 2130 10/23/11 0127 10/23/11 0528  BP: 121/75 150/94 150/97 152/88  Pulse: 83 87 87 78  Temp: 98 F (36.7 C) 98.9 F (37.2 C)  98.3 F (36.8 C)  TempSrc: Oral     Resp: 16 20  19   Height:      Weight:  106.7 kg (235 lb 3.7 oz)    SpO2: 96% 98%  100%    Wt Readings from Last 3 Encounters:  10/22/11 106.7 kg (235 lb 3.7 oz)  10/18/11 98.431 kg (217 lb)  10/18/11 98.431 kg (217 lb)     Intake/Output Summary (Last 24 hours) at 10/23/11 1135 Last data filed at 10/22/11 1525  Gross per 24 hour  Intake    934 ml  Output      0 ml  Net    934 ml    Exam Awake Alert, Oriented *3, No new F.N deficits, Normal affect .AT,PERRAL Supple Neck,No JVD, No cervical lymphadenopathy appriciated.  Symmetrical Chest wall movement, Good air movement bilaterally, CTAB RRR,No Gallops,Rubs or new Murmurs, No Parasternal Heave +ve B.Sounds, Abd Soft, Non tender, No organomegaly appriciated, No rebound -guarding or rigidity. No Cyanosis, Clubbing or edema, No new Rash or bruise , left foot is under bandage and has a splint.  Data Review  CBC  Lab 10/22/11 2224 10/20/11 0400 10/19/11 1835 10/18/11 1028  WBC 8.6 8.7 10.7* --    HGB 10.7* 9.8* 10.5* 12.2  HCT 33.9* 30.2* 32.6* 36.0  PLT 338 300 340 --  MCV 91.6 90.4 90.1 --  MCH 28.9 29.3 29.0 --  MCHC 31.6 32.5 32.2 --  RDW 14.9 14.8 14.7 --  LYMPHSABS -- -- 2.4 --  MONOABS -- -- 0.9 --  EOSABS -- -- 0.2 --  BASOSABS -- -- 0.0 --  BANDABS -- -- -- --    Chemistries   Lab 10/23/11 0716 10/22/11 2224 10/22/11 0404 10/21/11 0343 10/20/11 0400  NA 142 140 142 140 141  K 4.1 4.0 4.3 4.4 5.1  CL 113* 110 110 109 109  CO2 20 21 22 24 24   GLUCOSE 92 144* 103* 122* 80  BUN 53* 50* 50* 46* 42*  CREATININE 1.55* 1.66* 1.84* 2.00* 1.55*  CALCIUM 8.4 8.7 8.5 8.2* 8.6  MG 2.0 -- -- -- --  AST -- 34 -- -- 24  ALT -- 37* -- -- 24  ALKPHOS -- 106 -- -- 90  BILITOT -- 0.1* -- -- 0.3   ------------------------------------------------------------------------------------------------------------------ estimated creatinine clearance is 55.2 ml/min (by C-G formula based on Cr of 1.55). ------------------------------------------------------------------------------------------------------------------ No results found for this basename: HGBA1C:2 in the last 72 hours ------------------------------------------------------------------------------------------------------------------ No results found for this basename: CHOL:2,HDL:2,LDLCALC:2,TRIG:2,CHOLHDL:2,LDLDIRECT:2 in the last 72 hours ------------------------------------------------------------------------------------------------------------------ No results found for this basename: TSH,T4TOTAL,FREET3,T3FREE,THYROIDAB in the last 72 hours ------------------------------------------------------------------------------------------------------------------ No results found for this basename: VITAMINB12:2,FOLATE:2,FERRITIN:2,TIBC:2,IRON:2,RETICCTPCT:2 in the last 72 hours  Coagulation profile  Lab 10/22/11 2224 10/22/11 0404 10/21/11 1205 10/20/11 0400 10/18/11 0930  INR 1.43 1.30 1.18 1.13 1.05  PROTIME -- -- -- -- --     No results found for this basename: DDIMER:2 in the last 72 hours  Cardiac Enzymes No results found for this basename: CK:3,CKMB:3,TROPONINI:3,MYOGLOBIN:3 in the last 168 hours ------------------------------------------------------------------------------------------------------------------ No components found with this basename: POCBNP:3  Micro Results Recent Results (from the past 240 hour(s))  MRSA PCR SCREENING     Status: Normal   Collection Time   10/19/11 11:32 PM      Component Value Range Status Comment   MRSA by PCR NEGATIVE  NEGATIVE  Final   URINE CULTURE     Status: Normal (Preliminary result)   Collection Time   10/21/11 11:40 AM      Component Value Range Status Comment   Specimen Description URINE, CLEAN CATCH   Final    Special Requests NONE   Final    Culture  Setup Time [id number]   Final    Colony Count 50,000 COLONIES/ML   Final    Culture ESCHERICHIA COLI   Final    Report Status PENDING   Incomplete    Organism ID, Bacteria ESCHERICHIA COLI   Final     Radiology Reports Dg Chest 2 View  10/23/2011  *RADIOLOGY REPORT*  Clinical Data: Preop respiratory examination for foot amputation.  CHEST - 2 VIEW  Comparison: 10/20/2011 and 10/19/2011.  Findings: Positioning and aeration are slightly improved.  There is persistent cardiomegaly and basilar atelectasis.  No confluent airspace opacity, definite edema or significant pleural effusion is identified.  IMPRESSION: No acute cardiopulmonary process.  Stable cardiomegaly and basilar atelectasis.  Original Report Authenticated By: Vivia Ewing, M.D.   Dg Chest 2 View  10/20/2011  *RADIOLOGY REPORT*  Clinical Data: Atelectasis versus pneumonia  CHEST - 2 VIEW  Comparison: 10/19/2011  Findings:  Diminished exam detail secondary to motion artifact and patient's body habitus.  There is moderate to marked cardiac enlargement.  Lung volumes are low and there is asymmetric elevation of the right hemidiaphragm.   IMPRESSION:  1.  Diminished exam detail due to motion artifact and body habitus. 2.  Cardiac enlargement. 3.  Low lung volumes.  Original Report Authenticated By: Angelita Ingles, M.D.   Dg Chest 2 View  10/19/2011  *RADIOLOGY REPORT*  Clinical Data: Hypoglycemia.  Hypertension.  CHEST - 2 VIEW  Comparison: Plain films of the chest 09/13/2011.  Findings: Lung volumes are low with crowding of the bronchovascular structures.  Elevation of the right hemidiaphragm is noted.  There is new airspace disease in the left lung base.  IMPRESSION: New left basilar airspace disease could be due to atelectasis or pneumonia.  Original Report Authenticated By: Arvid Right. Luther Parody, M.D.   Ct Head Wo Contrast  10/19/2011  *RADIOLOGY REPORT*  Clinical Data: Hypoglycemia.  CT HEAD WITHOUT CONTRAST  Technique:  Contiguous  axial images were obtained from the base of the skull through the vertex without contrast.  Comparison: Head CT 04/22/2011 and brain MRI 04/23/2011.  Findings: Remote right MCA territory and left parietal infarcts are identified.  No evidence of acute abnormality including infarction, hemorrhage, mass lesion, mass effect, midline shift or abnormal extra-axial fluid collection.  No hydrocephalus or pneumocephalus. Calvarium intact.  IMPRESSION: No acute finding.  Remote bilateral infarcts.  Original Report Authenticated By: Arvid Right. D'ALESSIO, M.D.    Scheduled Meds:   . amLODipine  10 mg Oral Daily  . antiseptic oral rinse  15 mL Mouth Rinse BID  . citalopram  10 mg Oral Daily  . cycloSPORINE  1 drop Both Eyes BID  . feeding supplement  30 mL Oral TID WC  . ferrous sulfate  325 mg Oral BID  . gabapentin  600 mg Oral QID  . insulin aspart  0-9 Units Subcutaneous TID WC  . methylphenidate  10 mg Oral BID  . metoprolol  100 mg Oral BID  . silver sulfADIAZINE   Topical Daily  . simvastatin  20 mg Oral Daily  . tolnaftate   Topical Daily  . vitamin C  500 mg Oral BID  . warfarin  5 mg Oral  ONCE-1800  . warfarin  6 mg Oral ONCE-1800  . Warfarin - Pharmacist Dosing Inpatient   Does not apply q1800   Continuous Infusions:   . sodium chloride 75 mL/hr at 10/22/11 1525   PRN Meds:.acetaminophen, albuterol, ALPRAZolam, HYDROcodone-acetaminophen, ipratropium, ondansetron, traZODone  Assessment & Plan   51 year old female who was initially admitted to [facility] long hospital for hypoglycemic even at home, secondary to being on Glimeperide her A1c was 5.3 and her CBGs have been stable likely patient will not require any diabetic medications upon discharge. Also patient has long-standing history of left leg diabetic/ischemic foot ulcer, she was evaluated by Dr. Bridgett Larsson and Dr. Sharol Given to at [place] [facility], it was decided that patient will be best served with left BKA, patient was subsequently transferred to [facility] earlier this morning. However this morning patient has announced to Dr. Sharol Given that she does not want to undergo the surgery. She will now get wound care, she also had developed some acute renal insufficiency on top of her CKD stage IV which is now resolved in close to baseline, she is on Coumadin for history of stroke. Patient's INR is subtherapeutic pharmacies titrating her Coumadin once her INR is therapeutic she can be discharged home with home health for wound care which has been ordered.   1. Hypoglycemia: Secondary to glimepiride, in the background of poor PO intake. At this point no further hypoglycemia. Patient eating better and CBG stable. A1C demonstrated to be in 5.3 range (will no required glimepiride at d/c). Will continue carb modify diet and also continue SSI while in the hospital to monitor for Cbg fluctuation. Likely will not need any diabetic medications upon.  CBG (last 3)   Basename 10/23/11 0534 10/22/11 2151 10/22/11 1709  GLUCAP 109* 151* 133*     Lab Results  Component Value Date   HGBA1C 5.3 10/19/2011     2. Atelectasis. Stable, Continue  Encouraging IS use.    3. History of cardioembolic stroke:/ With residual left hemiparesis/flaccidity. No new deficit; continue secondary prevention with coumadin and control of risk factors.   Lab Results  Component Value Date   INR 1.43 10/22/2011   INR 1.30 10/22/2011   INR 1.18 10/21/2011  4. Chronic systolic CHF/EF [id number]: Compensated. Close I's and O's; daily weight. Continue beta blocker; ARB on hold due to acute renal failure. No JVD or sings of fluid overload. Stop IV fluids as her renal function has improved and close to baseline, resume her ARB if creatinine remains stable.   5. Acute renal failure/mild: Suspect secondary contrast from aortogram and ARB, continue holding losartan, post gentle IV fluids; Check BMET in a.m. Cr 1.84. Baseline creatinine appears to be around 1.5 patient is close to 1.5. Of IV fluids.   6. Severe peripheral arterial disease with gangrene of the left foot and multiple ulcers  Status post aortogram by Dr. Bridgett Larsson, advised to followup with Dr. Sharol Given for evaluation for BKA. she was evaluated by Dr. Bridgett Larsson and Dr. Sharol Given to at [place] [facility], it was decided that patient will be best served with left BKA, patient was subsequently transferred to [facility] earlier this morning. However this morning patient has announced to Dr. Sharol Given that she does not want to undergo the surgery, wound care has been ordered for here and for home.   7-Depression: Continue celexa    8-Hypoalbuminemia: will continue prostat.    9-HTN: Better. Will continue current adjusted antihypertensive regimen.    DVT Prophylaxis Coumadin      Thurnell Lose M.D on 10/23/2011 at 11:35 AM  [facility] Office  [phone]

## 2011-10-23 NOTE — Consult Note (Signed)
Reason for Consult: Ulceration and infection left foot. Referring Physician: Elberta Leatherwood [name] is an 51 y.o. female.  HPI: Patient is a 51 year old woman with uncontrolled type 2 diabetes. She has had progressive ischemic necrotic changes to the lateral border of the left foot. She has undergone arterial studies by vascular vein specialist which shows adequate large vessel circulation to the left lower extremity but does show significant disease of the microcirculation. Patient has previously been scheduled for surgical intervention for the necrotic wounds of the left foot. She is seen at this time for evaluation for possible surgical intervention.  Past Medical History  Diagnosis Date  . HTN (hypertension)   . HLD (hyperlipidemia)   . History of methicillin resistant staphylococcus aureus (MRSA)   . CVA (cerebral infarction)   . Anemia   . Tachycardia   . Obesity   . Osteomyelitis   . Lower back pain     Chronic  . Idiopathic peripheral neuropathy     NOS  . DJD (degenerative joint disease)   . Headache   . GERD (gastroesophageal reflux disease)   . Depression   . Anxiety   . Hypokalemia     resolved  . Cerebral infarct     left parietal and bilateral  . Esophagitis     distal  . Fluid overload     compensated  . Urinary retention   . H/O: pneumonia   . History of bacteremia   . Surgery, other elective     of the ear  . Stroke     Hx of left frontoparietal stroke in 12/11. History of right brain stroke.  . Cardiomyopathy     Echocardiogram 04/23/11: Mild LVH, EF 30-35%, mild MR, mild LAE, mild-moderate RVE.  TEE 04/25/11: Moderate LVH, severe biventricular dysfunction, EF 20-25%, moderate to severe MR, moderate TR, biatrial enlargement, no PFO, negative bubble study  . Shortness of breath     with exertion   . Sleep apnea     no machine used  pt does not remember test for sleep apnea  . DM type 2 (diabetes mellitus, type 2)     type two   oral meds only   . Renal  failure     due to vanc toxicity  no hd  . UTI (lower urinary tract infection) 10/19/11    had approx April 2013    Past Surgical History  Procedure Date  . Tympanoplasty   . Toe amputation     left 2nd toe  . Dilation and curettage of uterus     history  . Tee without cardioversion 04/25/2011    Procedure: TRANSESOPHAGEAL ECHOCARDIOGRAM (TEE);  Surgeon: Jolaine Artist, MD;  Location: [facility] ENDOSCOPY;  Service: Cardiovascular;  Laterality: N/A;  . Esophagogastroduodenoscopy 08/14/2011    Procedure: ESOPHAGOGASTRODUODENOSCOPY (EGD);  Surgeon: Scarlette Shorts, MD;  Location: [facility] ENDOSCOPY;  Service: Endoscopy;  Laterality: N/A;    Family History  Problem Relation Age of Onset  . Coronary artery disease    . Diabetes    . Cancer Mother   . Diabetes Mother   . Heart disease Father   . Hyperlipidemia Father   . Hypertension Father   . Stroke Father   . Heart disease Brother   . Anesthesia problems Neg Hx   . Hypotension Neg Hx   . Malignant hyperthermia Neg Hx   . Pseudochol deficiency Neg Hx     Social History:  reports that she has never smoked. She has never  used smokeless tobacco. She reports that she drinks alcohol. She reports that she does not use illicit drugs.  Allergies:  Allergies  Allergen Reactions  . Ace Inhibitors Shortness Of Breath  . Latex Hives  . Lisinopril Other (See Comments)    Per MAR  . Vancomycin Other (See Comments)    Per MAR    Medications: Sherry have reviewed the patient's current medications.  Results for orders placed during the hospital encounter of 10/19/11 (from the past 48 hour(s))  GLUCOSE, CAPILLARY     Status: Normal   Collection Time   10/21/11  7:30 AM      Component Value Range Comment   Glucose-Capillary 90  70 - 99 (mg/dL)   GLUCOSE, CAPILLARY     Status: Abnormal   Collection Time   10/21/11 11:17 AM      Component Value Range Comment   Glucose-Capillary 118 (*) 70 - 99 (mg/dL)    Comment 1 Notify RN     URINE CULTURE      Status: Normal (Preliminary result)   Collection Time   10/21/11 11:40 AM      Component Value Range Comment   Specimen Description URINE, CLEAN CATCH      Special Requests NONE      Culture  Setup Time [id number]      Colony Count 50,000 COLONIES/ML      Culture ESCHERICHIA COLI      Report Status PENDING     PROTIME-INR     Status: Normal   Collection Time   10/21/11 12:05 PM      Component Value Range Comment   Prothrombin Time 15.2  11.6 - 15.2 (seconds) RESULT CHECKED   INR 1.18  0.00 - 1.49    GLUCOSE, CAPILLARY     Status: Abnormal   Collection Time   10/21/11  4:35 PM      Component Value Range Comment   Glucose-Capillary 123 (*) 70 - 99 (mg/dL)    Comment 1 Notify RN     GLUCOSE, CAPILLARY     Status: Abnormal   Collection Time   10/21/11  9:36 PM      Component Value Range Comment   Glucose-Capillary 124 (*) 70 - 99 (mg/dL)    Comment 1 Documented in Chart      Comment 2 Notify RN     BASIC METABOLIC PANEL     Status: Abnormal   Collection Time   10/22/11  4:04 AM      Component Value Range Comment   Sodium 142  135 - 145 (mEq/L)    Potassium 4.3  3.5 - 5.1 (mEq/L)    Chloride 110  96 - 112 (mEq/L)    CO2 22  19 - 32 (mEq/L)    Glucose, Bld 103 (*) 70 - 99 (mg/dL)    BUN 50 (*) 6 - 23 (mg/dL)    Creatinine, Ser 1.84 (*) 0.50 - 1.10 (mg/dL)    Calcium 8.5  8.4 - 10.5 (mg/dL)    GFR calc non Af Amer 31 (*) >90 (mL/min)    GFR calc Af Amer 36 (*) >90 (mL/min)   PROTIME-INR     Status: Abnormal   Collection Time   10/22/11  4:04 AM      Component Value Range Comment   Prothrombin Time 16.4 (*) 11.6 - 15.2 (seconds)    INR 1.30  0.00 - 1.49    GLUCOSE, CAPILLARY     Status: Normal   Collection Time  10/22/11  7:41 AM      Component Value Range Comment   Glucose-Capillary 87  70 - 99 (mg/dL)    Comment 1 Notify RN     GLUCOSE, CAPILLARY     Status: Abnormal   Collection Time   10/22/11 11:43 AM      Component Value Range Comment   Glucose-Capillary 147 (*) 70  - 99 (mg/dL)    Comment 1 Notify RN     GLUCOSE, CAPILLARY     Status: Abnormal   Collection Time   10/22/11  5:09 PM      Component Value Range Comment   Glucose-Capillary 133 (*) 70 - 99 (mg/dL)    Comment 1 Notify RN     GLUCOSE, CAPILLARY     Status: Abnormal   Collection Time   10/22/11  9:51 PM      Component Value Range Comment   Glucose-Capillary 151 (*) 70 - 99 (mg/dL)   APTT     Status: Abnormal   Collection Time   10/22/11 10:24 PM      Component Value Range Comment   aPTT 42 (*) 24 - 37 (seconds)   CBC     Status: Abnormal   Collection Time   10/22/11 10:24 PM      Component Value Range Comment   WBC 8.6  4.0 - 10.5 (K/uL)    RBC 3.70 (*) 3.87 - 5.11 (MIL/uL)    Hemoglobin 10.7 (*) 12.0 - 15.0 (g/dL)    HCT 33.9 (*) 36.0 - 46.0 (%)    MCV 91.6  78.0 - 100.0 (fL)    MCH 28.9  26.0 - 34.0 (pg)    MCHC 31.6  30.0 - 36.0 (g/dL)    RDW 14.9  11.5 - 15.5 (%)    Platelets 338  150 - 400 (K/uL)   COMPREHENSIVE METABOLIC PANEL     Status: Abnormal   Collection Time   10/22/11 10:24 PM      Component Value Range Comment   Sodium 140  135 - 145 (mEq/L)    Potassium 4.0  3.5 - 5.1 (mEq/L)    Chloride 110  96 - 112 (mEq/L)    CO2 21  19 - 32 (mEq/L)    Glucose, Bld 144 (*) 70 - 99 (mg/dL)    BUN 50 (*) 6 - 23 (mg/dL)    Creatinine, Ser 1.66 (*) 0.50 - 1.10 (mg/dL)    Calcium 8.7  8.4 - 10.5 (mg/dL)    Total Protein 6.6  6.0 - 8.3 (g/dL)    Albumin 2.5 (*) 3.5 - 5.2 (g/dL)    AST 34  0 - 37 (U/L)    ALT 37 (*) 0 - 35 (U/L)    Alkaline Phosphatase 106  39 - 117 (U/L)    Total Bilirubin 0.1 (*) 0.3 - 1.2 (mg/dL)    GFR calc non Af Amer 35 (*) >90 (mL/min)    GFR calc Af Amer 41 (*) >90 (mL/min)   PROTIME-INR     Status: Abnormal   Collection Time   10/22/11 10:24 PM      Component Value Range Comment   Prothrombin Time 17.7 (*) 11.6 - 15.2 (seconds)    INR 1.43  0.00 - 1.49    GLUCOSE, CAPILLARY     Status: Abnormal   Collection Time   10/23/11  5:34 AM      Component  Value Range Comment   Glucose-Capillary 109 (*) 70 - 99 (mg/dL)  No results found.  Review of Systems  All other systems reviewed and are negative.   Blood pressure 152/88, pulse 78, temperature 98.3 F (36.8 C), temperature source Oral, resp. rate 19, height 5\' 7"  (1.702 m), weight 106.7 kg (235 lb 3.7 oz), SpO2 100.00%. Physical Exam On examination of the patient's left foot she does not have palpable pulses. The ischemic gangrenous changes to the fourth toe are stable. There is no cellulitis. The wound over the lateral malleolus base of the fifth metatarsal and fourth metatarsal head are stable there is granulation tissue no cellulitis no drainage.  Assessment/Plan: Assessment: Stable gangrenous wounds lateral border left foot.  Plan:  Patient and her family are reluctant to proceed with surgery at this time. Sherry feel it is safe to continue observation. Will continue with Silvadene dressing changes Sherry will followup in the office in 3 weeks.  [name] V 10/23/2011, 6:32 AM

## 2011-10-24 DIAGNOSIS — I5022 Chronic systolic (congestive) heart failure: Secondary | ICD-10-CM

## 2011-10-24 DIAGNOSIS — I69949 Monoplegia of lower limb following unspecified cerebrovascular disease affecting unspecified side: Secondary | ICD-10-CM

## 2011-10-24 DIAGNOSIS — N179 Acute kidney failure, unspecified: Secondary | ICD-10-CM

## 2011-10-24 DIAGNOSIS — E1169 Type 2 diabetes mellitus with other specified complication: Secondary | ICD-10-CM

## 2011-10-24 LAB — BASIC METABOLIC PANEL
BUN: 51 mg/dL — ABNORMAL HIGH (ref 6–23)
Calcium: 8.5 mg/dL (ref 8.4–10.5)
Creatinine, Ser: 1.36 mg/dL — ABNORMAL HIGH (ref 0.50–1.10)
GFR calc Af Amer: 52 mL/min — ABNORMAL LOW (ref >90)
GFR calc non Af Amer: 45 mL/min — ABNORMAL LOW (ref >90)
Glucose, Bld: 114 mg/dL — ABNORMAL HIGH (ref 70–99)

## 2011-10-24 LAB — URINE CULTURE
Colony Count: 50000
Culture  Setup Time: 201305121732

## 2011-10-24 LAB — GLUCOSE, CAPILLARY
Glucose-Capillary: 129 mg/dL — ABNORMAL HIGH (ref 70–99)
Glucose-Capillary: 123 mg/dL — ABNORMAL HIGH (ref 70–99)
Glucose-Capillary: 156 mg/dL — ABNORMAL HIGH (ref 70–99)

## 2011-10-24 LAB — PROTIME-INR: Prothrombin Time: 23.7 seconds — ABNORMAL HIGH (ref 11.6–15.2)

## 2011-10-24 MED ORDER — WARFARIN SODIUM 4 MG PO TABS
4.0000 mg | ORAL_TABLET | Freq: Once | ORAL | Status: AC
  Administered 2011-10-24: 4 mg via ORAL
  Filled 2011-10-24: qty 1

## 2011-10-24 NOTE — Progress Notes (Signed)
SNF bed offers rec'd provided to patient and her sister- sister plans to tour today and advise- Updated MD- Will update after sister visits and responds back to me. Eduard Clos, MSW, [place]

## 2011-10-24 NOTE — Progress Notes (Signed)
Subjective: Awaiting beds at rehab.  Objective: Weight change: -2.12 kg (-4 lb 10.8 oz)  Intake/Output Summary (Last 24 hours) at 10/24/11 1207 Last data filed at 10/24/11 0800  Gross per 24 hour  Intake    120 ml  Output      0 ml  Net    120 ml    Filed Vitals:   10/24/11 0627  BP: 151/71  Pulse: 84  Temp: 97.4 F (36.3 C)  Resp: 16   Exam  Awake Alert, Oriented *3, No new F.N deficits, Normal affect  Supple Neck,No JVD,   Symmetrical Chest wall movement, Good air movement bilaterally, CTAB  RRR,No Gallops,Rubs or new Murmurs, +ve B.Sounds, Abd Soft, Non tender, No organomegaly appriciated, No rebound -guarding or rigidity.  No Cyanosis, Clubbing or edema, No new Rash or bruise , left foot is under bandage and has a splint.   Lab Results: Results for orders placed during the hospital encounter of 10/19/11 (from the past 24 hour(s))  GLUCOSE, CAPILLARY     Status: Abnormal   Collection Time   10/23/11  9:06 PM      Component Value Range   Glucose-Capillary 173 (*) 70 - 99 (mg/dL)  PROTIME-INR     Status: Abnormal   Collection Time   10/24/11  5:55 AM      Component Value Range   Prothrombin Time 23.7 (*) 11.6 - 15.2 (seconds)   INR 2.07 (*) 0.00 - [id number]   BASIC METABOLIC PANEL     Status: Abnormal   Collection Time   10/24/11  5:55 AM      Component Value Range   Sodium 142  135 - 145 (mEq/L)   Potassium 4.1  3.5 - 5.1 (mEq/L)   Chloride 113 (*) 96 - 112 (mEq/L)   CO2 21  19 - 32 (mEq/L)   Glucose, Bld 114 (*) 70 - 99 (mg/dL)   BUN 51 (*) 6 - 23 (mg/dL)   Creatinine, Ser 1.36 (*) 0.50 - 1.10 (mg/dL)   Calcium 8.5  8.4 - 10.5 (mg/dL)   GFR calc non Af Amer 45 (*) >90 (mL/min)   GFR calc Af Amer 52 (*) >90 (mL/min)  GLUCOSE, CAPILLARY     Status: Abnormal   Collection Time   10/24/11  6:24 AM      Component Value Range   Glucose-Capillary 106 (*) 70 - 99 (mg/dL)  GLUCOSE, CAPILLARY     Status: Abnormal   Collection Time   10/24/11 11:35 AM      Component  Value Range   Glucose-Capillary 129 (*) 70 - 99 (mg/dL)   Comment 1 Notify RN      Micro Results: Recent Results (from the past 240 hour(s))  MRSA PCR SCREENING     Status: Normal   Collection Time   10/19/11 11:32 PM      Component Value Range Status Comment   MRSA by PCR NEGATIVE  NEGATIVE  Final   URINE CULTURE     Status: Normal   Collection Time   10/21/11 11:40 AM      Component Value Range Status Comment   Specimen Description URINE, CLEAN CATCH   Final    Special Requests NONE   Final    Culture  Setup Time [id number]   Final    Colony Count 50,000 COLONIES/ML   Final    Culture     Final    Value: ESCHERICHIA COLI     STAPHYLOCOCCUS SPECIES (COAGULASE NEGATIVE)  Note: RIFAMPIN AND GENTAMICIN SHOULD NOT BE USED AS SINGLE DRUGS FOR TREATMENT OF STAPH INFECTIONS.   Report Status 10/24/2011 FINAL   Final    Organism ID, Bacteria ESCHERICHIA COLI   Final    Organism ID, Bacteria STAPHYLOCOCCUS SPECIES (COAGULASE NEGATIVE)   Final     Studies/Results: Dg Chest 2 View  10/23/2011  *RADIOLOGY REPORT*  Clinical Data: Preop respiratory examination for foot amputation.  CHEST - 2 VIEW  Comparison: 10/20/2011 and 10/19/2011.  Findings: Positioning and aeration are slightly improved.  There is persistent cardiomegaly and basilar atelectasis.  No confluent airspace opacity, definite edema or significant pleural effusion is identified.  IMPRESSION: No acute cardiopulmonary process.  Stable cardiomegaly and basilar atelectasis.  Original Report Authenticated By: Vivia Ewing, M.D.   Dg Chest 2 View  10/20/2011  *RADIOLOGY REPORT*  Clinical Data: Atelectasis versus pneumonia  CHEST - 2 VIEW  Comparison: 10/19/2011  Findings:  Diminished exam detail secondary to motion artifact and patient's body habitus.  There is moderate to marked cardiac enlargement.  Lung volumes are low and there is asymmetric elevation of the right hemidiaphragm.  IMPRESSION:  1.  Diminished exam detail due to  motion artifact and body habitus. 2.  Cardiac enlargement. 3.  Low lung volumes.  Original Report Authenticated By: Angelita Ingles, M.D.   Dg Chest 2 View  10/19/2011  *RADIOLOGY REPORT*  Clinical Data: Hypoglycemia.  Hypertension.  CHEST - 2 VIEW  Comparison: Plain films of the chest 09/13/2011.  Findings: Lung volumes are low with crowding of the bronchovascular structures.  Elevation of the right hemidiaphragm is noted.  There is new airspace disease in the left lung base.  IMPRESSION: New left basilar airspace disease could be due to atelectasis or pneumonia.  Original Report Authenticated By: Arvid Right. Luther Parody, M.D.   Ct Head Wo Contrast  10/19/2011  *RADIOLOGY REPORT*  Clinical Data: Hypoglycemia.  CT HEAD WITHOUT CONTRAST  Technique:  Contiguous axial images were obtained from the base of the skull through the vertex without contrast.  Comparison: Head CT 04/22/2011 and brain MRI 04/23/2011.  Findings: Remote right MCA territory and left parietal infarcts are identified.  No evidence of acute abnormality including infarction, hemorrhage, mass lesion, mass effect, midline shift or abnormal extra-axial fluid collection.  No hydrocephalus or pneumocephalus. Calvarium intact.  IMPRESSION: No acute finding.  Remote bilateral infarcts.  Original Report Authenticated By: Arvid Right. Luther Parody, M.D.   Medications: Scheduled Meds:   . amLODipine  10 mg Oral Daily  . antiseptic oral rinse  15 mL Mouth Rinse BID  . citalopram  10 mg Oral Daily  . cycloSPORINE  1 drop Both Eyes BID  . feeding supplement  30 mL Oral TID WC  . ferrous sulfate  325 mg Oral BID  . gabapentin  600 mg Oral QID  . insulin aspart  0-9 Units Subcutaneous TID WC  . methylphenidate  10 mg Oral BID  . metoprolol  100 mg Oral BID  . silver sulfADIAZINE   Topical Daily  . simvastatin  20 mg Oral Daily  . tolnaftate   Topical Daily  . vitamin C  500 mg Oral BID  . warfarin  4 mg Oral ONCE-1800  . warfarin  5 mg Oral  ONCE-1800  . Warfarin - Pharmacist Dosing Inpatient   Does not apply q1800   Continuous Infusions:  PRN Meds:.acetaminophen, albuterol, ALPRAZolam, clonazePAM, HYDROcodone-acetaminophen, ipratropium, ondansetron, traZODone  Assessment/Plan: Patient Active Hospital Problem List: 1. DIABETES MELLITUS, TYPE II (01/03/2007)  CBG (last 3)   Basename 10/24/11 1135 10/24/11 0624 10/23/11 2106  GLUCAP 129* 106* 173*   Very well controlled. On SSI  3. History of cardioembolic stroke:/ With residual left hemiparesis/flaccidity. No new deficit; continue secondary prevention with coumadin and control of risk factors.   4. Chronic systolic CHF/EF [id number]: Compensated. Close I's and O's; daily weight. Continue beta blocker; ARB on hold due to acute renal failure. No JVD or sings of fluid overload. Stop IV fluids as her renal function has improved and close to baseline, resume her ARB if creatinine remains stable.  5. Acute renal failure/mild: Suspect secondary contrast from aortogram and ARB, continue holding losartan, post gentle IV fluids TODAY  Cr 1.36. Baseline creatinine appears to be around 1.5.   6. Severe peripheral arterial disease with gangrene of the left foot and multiple ulcers  Status post aortogram by Dr. Bridgett Larsson, advised to followup with Dr. Sharol Given for evaluation for BKA. she was evaluated by Dr. Bridgett Larsson and Dr. Sharol Given to at [facility], it was decided that patient will be best served with left BKA, patient was subsequently transferred to [facility] earlier this morning. However this morning patient has announced to Dr. Sharol Given that she does not want to undergo the surgery, wound care has been ordered for here. PT evaluation done and recommended Rehab.  7-Depression: Continue celexa  8-Hypoalbuminemia: will continue prostat.  9-HTN: Better. Will continue current adjusted antihypertensive regimen.  DVT Prophylaxis Coumadin  Disposition: Awaiting SNF placement.      Hypoglycemia  (10/19/2011) Secondary to glimepiride, in the background of poor PO intake. At this point no further hypoglycemia. Patient eating better and CBG stable. A1C demonstrated to be in 5.3 range (will no required glimepiride at d/c). Will continue carb modify diet and also continue SSI while in the hospital to monitor for Cbg fluctuation. Likely will not need any diabetic medications upon.       LOS: 5 days   Pansey Pinheiro 10/24/2011, 12:07 PM

## 2011-10-24 NOTE — Progress Notes (Signed)
ANTICOAGULATION CONSULT NOTE - Follow Up Consult  Pharmacy Consult for Coumadin Indication: Hx CVA  Allergies  Allergen Reactions  . Ace Inhibitors Shortness Of Breath  . Latex Hives  . Lisinopril Other (See Comments)    Per MAR  . Vancomycin Other (See Comments)    Per [facility]    Patient Measurements: Height: 5\' 7"  (170.2 cm) Weight: 230 lb 8.9 oz (104.58 kg) IBW/kg (Calculated) : 61.6  Heparin Dosing Weight:   Vital Signs: Temp: 97.4 F (36.3 C) (05/15 0627) BP: 151/71 mmHg (05/15 0627) Pulse Rate: 84  (05/15 0627)  Labs:  Basename 10/24/11 0555 10/23/11 0716 10/22/11 2224 10/22/11 0404  HGB -- -- 10.7* --  HCT -- -- 33.9* --  PLT -- -- 338 --  APTT -- -- 42* --  LABPROT 23.7* -- 17.7* 16.4*  INR 2.07* -- 1.43 1.30  HEPARINUNFRC -- -- -- --  CREATININE 1.36* 1.55* 1.66* --  CKTOTAL -- -- -- --  CKMB -- -- -- --  TROPONINI -- -- -- --    Estimated Creatinine Clearance: 62.2 ml/min (by C-G formula based on Cr of 1.36).  Assessment: 51yof continuing Coumadin for hx CVA. INR (2.07) is now therapeutic. INR has increased from 1.3 over 2 days with Coumadin 6mg  and 5mg  doses - will decrease back to home regimen (4mg  daily). - No CBC this AM - No significant bleeding reported  Goal of Therapy:  INR 2-3   Plan:  1. Restart patient's home regimen - 4mg  daily 2. Follow-up AM INR and discharge plans  [name] [id number] 10/24/2011,9:43 AM

## 2011-10-25 DIAGNOSIS — I69949 Monoplegia of lower limb following unspecified cerebrovascular disease affecting unspecified side: Secondary | ICD-10-CM

## 2011-10-25 DIAGNOSIS — E1169 Type 2 diabetes mellitus with other specified complication: Secondary | ICD-10-CM

## 2011-10-25 DIAGNOSIS — I70269 Atherosclerosis of native arteries of extremities with gangrene, unspecified extremity: Secondary | ICD-10-CM

## 2011-10-25 DIAGNOSIS — I5022 Chronic systolic (congestive) heart failure: Secondary | ICD-10-CM

## 2011-10-25 LAB — BASIC METABOLIC PANEL
BUN: 54 mg/dL — ABNORMAL HIGH (ref 6–23)
CO2: 23 mEq/L (ref 19–32)
Chloride: 109 mEq/L (ref 96–112)
Creatinine, Ser: 1.32 mg/dL — ABNORMAL HIGH (ref 0.50–1.10)

## 2011-10-25 LAB — GLUCOSE, CAPILLARY: Glucose-Capillary: 152 mg/dL — ABNORMAL HIGH (ref 70–99)

## 2011-10-25 LAB — PROTIME-INR: INR: 2.28 — ABNORMAL HIGH (ref 0.00–1.49)

## 2011-10-25 MED ORDER — METHOCARBAMOL 500 MG PO TABS
500.0000 mg | ORAL_TABLET | Freq: Three times a day (TID) | ORAL | Status: DC | PRN
  Administered 2011-10-25: 500 mg via ORAL
  Filled 2011-10-25: qty 1

## 2011-10-25 MED ORDER — WARFARIN SODIUM 4 MG PO TABS
4.0000 mg | ORAL_TABLET | Freq: Every day | ORAL | Status: DC
  Administered 2011-10-25: 4 mg via ORAL
  Filled 2011-10-25 (×2): qty 1

## 2011-10-25 MED ORDER — HYDROCODONE-ACETAMINOPHEN 5-325 MG PO TABS: 1.0000 | ORAL_TABLET | ORAL | Status: AC | PRN

## 2011-10-25 MED ORDER — PRO-STAT SUGAR FREE PO LIQD: 30.0000 mL | Freq: Three times a day (TID) | ORAL | Status: DC

## 2011-10-25 NOTE — Progress Notes (Signed)
Patient's family updated this morning and is working to select bed from offers. I have expressed to the family that she will likely be ready for d/c today. MD updated via text page. Eduard Clos, MSW, [place]

## 2011-10-25 NOTE — Progress Notes (Signed)
ANTICOAGULATION CONSULT NOTE - Follow Up Consult  Pharmacy Consult for Coumadin Indication: Hx CVA  Allergies  Allergen Reactions  . Ace Inhibitors Shortness Of Breath  . Latex Hives  . Lisinopril Other (See Comments)    Per MAR  . Vancomycin Other (See Comments)    Per [facility]    Patient Measurements: Height: 5\' 7"  (170.2 cm) Weight: 230 lb 8.9 oz (104.58 kg) IBW/kg (Calculated) : 61.6  Heparin Dosing Weight:   Vital Signs: Temp: 98.5 F (36.9 C) (05/16 0612) BP: 154/90 mmHg (05/16 0612) Pulse Rate: 77  (05/16 0612)  Labs:  Basename 10/25/11 0620 10/24/11 0555 10/23/11 0716 10/22/11 2224  HGB -- -- -- 10.7*  HCT -- -- -- 33.9*  PLT -- -- -- 338  APTT -- -- -- 42*  LABPROT 25.5* 23.7* -- 17.7*  INR 2.28* 2.07* -- 1.43  HEPARINUNFRC -- -- -- --  CREATININE -- 1.36* 1.55* 1.66*  CKTOTAL -- -- -- --  CKMB -- -- -- --  TROPONINI -- -- -- --    Estimated Creatinine Clearance: 62.2 ml/min (by C-G formula based on Cr of 1.36).  Assessment: 51yof continuing Coumadin for hx CVA. INR is now therapeutic.  INR had large increases with higher than pts usual warfarin doses, now appearing to have stabilized with resuming home regimen of 4mg  daily.  - No CBC this AM - No significant bleeding reported  Goal of Therapy:  INR 2-3   Plan:  1. Continue home regiment of coumadin 4mg  daily 2. Follow-up AM INR and discharge plans  [name] L. Amada Jupiter, PharmD, [place] Clinical Pharmacist Pager: [phone] 10/25/2011 12:23 PM

## 2011-10-25 NOTE — Consult Note (Signed)
WOC consult Note Reason for Consult: consult req for d/c wound instructions. Pt has been followed by Dr. Sharol Given of ortho service for assessment and POC. Continue silvadene as previously ordered. No family present at bedside, patient will receive total care at [facility].  Emi Holes RN BSN, wound care student Will not plan to follow further unless re-consulted.  [address] [place], MSN, [place]

## 2011-10-25 NOTE — Discharge Summary (Signed)
DISCHARGE SUMMARY  JACOBY BEIRNE  MR#: [id number]  DOB:Mar 22, 1961  Date of Admission: 10/19/2011 Date of Discharge: 10/25/2011  Attending Physician:Persis Graffius  Patient's FI:[id number], Gwynneth Munson, MD, MD  Consults: orthopedic CONSULT  Discharge Diagnoses: Acute on chronic RF (acute renal failure) Hypoglycemia gangrene of the left foot and multiple ulcers Severe PVD  Chronic systolic CHF/EF [id number] CVA HTN Depression      Initial presentation: Ms Stephanie Acre is a 51/F from SNF with past medical history significant for cardioembolic stroke with L hemiparesis on Coumadin, severe peripheral arterial disease with gangrene of left foot, diabetes, systolic CHF was sent from the skilled nursing facility with the above-mentioned complaint   Hospital Course by problem List:  Hypoglycemia (10/19/2011) Secondary to glimepiride, in the background of poor PO intake. At this point no further hypoglycemia. Patient eating better and CBG stable. A1C demonstrated to be in 5.3 range (will not require glimepiride at discharge. Will continue carb modify diet . Likely will not need any diabetic medications upon discharge.  CBG (last 3)   Basename 10/25/11 1122 10/25/11 0705 10/24/11 2205  GLUCAP 105* 164* 156*   History of cardioembolic stroke:/ With residual left hemiparesis/flaccidity. No new deficit; continue secondary prevention with coumadin and control of risk factors.    Chronic systolic CHF/EF [id number]: Compensated. Close I's and O's; daily weight. Continue beta blocker; ARB on hold due to acute renal failure. No JVD or sings of fluid overload.  resume her ARB if creatinine improves in one week.   Acute on Chronic renal failure: Suspect secondary contrast from aortogram and ARB, continue holding losartan, post gentle IV fluids TODAY Cr 1.32. Continue to hold losartan. Get BMP in one week and if creatinine improved can resume losartan.   Severe peripheral arterial disease with gangrene of the left foot  and multiple ulcers  Status post aortogram by Dr. Bridgett Larsson, advised to followup with Dr. Sharol Given for evaluation for BKA. she was evaluated by Dr. Bridgett Larsson and Dr. Sharol Given to at [place] [facility], it was decided that patient will be best served with left BKA, patient was subsequently transferred to [facility]. However  patient has announced to Dr. Sharol Given that she does not want to undergo the surgery,Patient and her family are reluctant to proceed with surgery at this time  wound care has been ordered and recommended daily silvadene dressings. Dr Sharol Given recommended that it is safe to continue observation and follow up in his office in 3 weeks. PT evaluation done and recommended Rehab.   Depression: Continue celexa  Hypoalbuminemia: will continue prostat.  HTN: Better. Will continue current adjusted antihypertensive regimen.          Medication List  As of 10/25/2011 11:16 AM   STOP taking these medications         nitrofurantoin (macrocrystal-monohydrate) 100 MG capsule         TAKE these medications         acetaminophen 325 MG tablet   Commonly known as: TYLENOL   Take 650 mg by mouth every 4 (four) hours as needed. For pain      ALPRAZolam 0.25 MG tablet   Commonly known as: XANAX   Take 0.25 mg by mouth 3 (three) times daily as needed. For anxiety      amLODipine 5 MG tablet   Commonly known as: NORVASC   Take 5 mg by mouth daily.      antiseptic oral rinse Liqd   15 mLs by Mouth Rinse route 2 (two) times daily.  citalopram 10 MG tablet   Commonly known as: CELEXA   Take 10 mg by mouth daily.      cloNIDine 0.1 MG tablet   Commonly known as: CATAPRES   Take 0.1 mg by mouth every 8 (eight) hours as needed. For blood pressure SBP>OR = 180      cycloSPORINE 0.05 % ophthalmic emulsion   Commonly known as: RESTASIS   Place 1 drop into both eyes 2 (two) times daily.      feeding supplement Liqd   Take 30 mLs by mouth 3 (three) times daily with meals.      ferrous sulfate 325  (65 FE) MG tablet   Take 325 mg by mouth 2 (two) times daily.      gabapentin 600 MG tablet   Commonly known as: NEURONTIN   Take 600 mg by mouth 4 (four) times daily.      HYDROcodone-acetaminophen 5-325 MG per tablet   Commonly known as: NORCO   Take 1 tablet by mouth every 4 (four) hours as needed. For pain      insulin aspart 100 UNIT/ML injection   Commonly known as: novoLOG   Inject 5 Units into the skin 4 (four) times daily -  with meals and at bedtime. If CBG >150      losartan 100 MG tablet   Commonly known as: COZAAR   Take 100 mg by mouth daily.      MAALOX PLUS PO   Take 30 mLs by mouth 4 (four) times daily as needed. For indigestion.      methocarbamol 500 MG tablet   Commonly known as: ROBAXIN   Take 500 mg by mouth every 6 (six) hours as needed. For muscle spasms.      methylphenidate 10 MG tablet   Commonly known as: RITALIN   Take 10 mg by mouth 2 (two) times daily.      metoprolol 100 MG tablet   Commonly known as: LOPRESSOR   Take 100 mg by mouth 2 (two) times daily.      multivitamins ther. w/minerals Tabs   Take 2 tablets by mouth daily.      neomycin-bacitracin-polymyxin Oint   Commonly known as: NEOSPORIN   Apply 1 application topically daily. To foot and toes on left foot      ondansetron 8 MG tablet   Commonly known as: ZOFRAN   Take 8 mg by mouth every 12 (twelve) hours as needed. nausea      TOLNAFTATE ANTIFUNGAL EX   Apply topically. toes      traZODone 50 MG tablet   Commonly known as: DESYREL   Take 25-75 mg by mouth at bedtime as needed. For insomnia      vitamin C 500 MG tablet   Commonly known as: ASCORBIC ACID   Take 500 mg by mouth 2 (two) times daily.      warfarin 4 MG tablet   Commonly known as: COUMADIN   Take 4 mg by mouth daily.         ASK your doctor about these medications         atorvastatin 20 MG tablet   Commonly known as: LIPITOR   Take 20 mg by mouth at bedtime.      glimepiride 2 MG tablet    Commonly known as: AMARYL   Take 2 mg by mouth daily before breakfast.             Day of Discharge BP 154/90  Pulse  77  Temp(Src) 98.5 F (36.9 C) (Oral)  Resp 18  Ht 5\' 7"  (1.702 m)  Wt 104.58 kg (230 lb 8.9 oz)  BMI 36.11 kg/m2  SpO2 96%  Physical Exam:  Exam  Awake Alert, Oriented *3, No new F.N deficits, Normal affect  Supple Neck,No JVD,  Symmetrical Chest wall movement, Good air movement bilaterally, CTAB  RRR,No Gallops,Rubs or new Murmurs,  +ve B.Sounds, Abd Soft, Non tender, No organomegaly appriciated, No rebound -guarding or rigidity.  No Cyanosis, Clubbing or edema, No new Rash or bruise , left foot is under bandage and has a splint.  Results for orders placed during the hospital encounter of 10/19/11 (from the past 24 hour(s))  GLUCOSE, CAPILLARY     Status: Abnormal   Collection Time   10/24/11 11:35 AM      Component Value Range   Glucose-Capillary 129 (*) 70 - 99 (mg/dL)   Comment 1 Notify RN    GLUCOSE, CAPILLARY     Status: Abnormal   Collection Time   10/24/11  4:51 PM      Component Value Range   Glucose-Capillary 123 (*) 70 - 99 (mg/dL)   Comment 1 Notify RN     Comment 2 Documented in Chart    GLUCOSE, CAPILLARY     Status: Abnormal   Collection Time   10/24/11 10:05 PM      Component Value Range   Glucose-Capillary 156 (*) 70 - 99 (mg/dL)  PROTIME-INR     Status: Abnormal   Collection Time   10/25/11  6:20 AM      Component Value Range   Prothrombin Time 25.5 (*) 11.6 - 15.2 (seconds)   INR 2.28 (*) 0.00 - 1.49   GLUCOSE, CAPILLARY     Status: Abnormal   Collection Time   10/25/11  7:05 AM      Component Value Range   Glucose-Capillary 164 (*) 70 - 99 (mg/dL)    Disposition: SNF   Follow-up Appts: Discharge Orders    Future Appointments: Provider: Department: Dept Phone: Center:   11/08/2011 10:45 AM Charlett Blake, MD [facility] [phone] None   11/30/2011 9:00 AM [facility] [id number] [facility]   11/30/2011  9:45 AM Conrad Gallatin, MD [facility] [id number] [facility]     Future Orders Please Complete By Expires   Diet - low sodium heart healthy      Discharge instructions      Comments:   fOLLOW UP WITH WOUND CARE AT THE SNF. FOLLOW UP WITH DR DUDA AT THE OFFICE IN 3 WEEKS. PLEASE CALL AND MAKE THE APPOINTMENT.   Activity as tolerated - No restrictions      Discharge wound care:      Comments: daily silvadene dressings.   AS RECOMMENDED.      Follow-up Information    Follow up with DUDA,MARCUS V, MD in 3 weeks.   Contact information:   [place] [phone]          Tests Needing Follow-up: Bmp in one week, check renal function and can resume losartan if renal function improves.   Time spent in discharge (includes decision making & examination of pt): 62 minutes  Signed: Laiylah Roettger 10/25/2011, 11:16 AM

## 2011-10-26 LAB — PROTIME-INR
INR: 2.29 — ABNORMAL HIGH (ref 0.00–1.49)
Prothrombin Time: 25.6 seconds — ABNORMAL HIGH (ref 11.6–15.2)

## 2011-10-26 LAB — GLUCOSE, CAPILLARY: Glucose-Capillary: 136 mg/dL — ABNORMAL HIGH (ref 70–99)

## 2011-10-26 NOTE — Progress Notes (Signed)
ANTICOAGULATION CONSULT NOTE - Follow Up Consult  Pharmacy Consult for Coumadin Indication: CVA  Allergies  Allergen Reactions  . Ace Inhibitors Shortness Of Breath  . Latex Hives  . Lisinopril Other (See Comments)    Per MAR  . Vancomycin Other (See Comments)    Per [facility]    Patient Measurements: Height: 5\' 7"  (170.2 cm) Weight: 230 lb 8.9 oz (104.58 kg) IBW/kg (Calculated) : 61.6  Heparin Dosing Weight:   Vital Signs: Temp: 98 F (36.7 C) (05/17 0459) BP: 139/66 mmHg (05/17 0459) Pulse Rate: 82  (05/17 0459)  Labs:  Basename 10/26/11 0608 10/25/11 1133 10/25/11 0620 10/24/11 0555  HGB -- -- -- --  HCT -- -- -- --  PLT -- -- -- --  APTT -- -- -- --  LABPROT 25.6* -- 25.5* 23.7*  INR 2.29* -- 2.28* 2.07*  HEPARINUNFRC -- -- -- --  CREATININE -- 1.32* -- 1.36*  CKTOTAL -- -- -- --  CKMB -- -- -- --  TROPONINI -- -- -- --    Estimated Creatinine Clearance: 64.1 ml/min (by C-G formula based on Cr of 1.32).  Assessment: 51yof on Coumadin for hx CVA. INR (2.29) is therapeutic and has stabilized since restarting patient's home regimen (4mg  daily). - No CBC this AM - No significant bleeding reported  Goal of Therapy:  INR 2-3    Plan:  1. Continue Coumadin 4mg  po daily 2. Follow-up AM INR   Earleen Newport [id number] 10/26/2011,10:17 AM

## 2011-10-26 NOTE — Progress Notes (Signed)
SNF bed is ready for patient's return today- patient and family agreeable- plans transfer via EMS. Eduard Clos, MSW, [place] ]

## 2011-10-26 NOTE — Progress Notes (Signed)
Pt transferred to [facility] SN[facility] via [facility] in stable condition. All belongings sent with pt. No family present at time of transfer.

## 2011-11-08 ENCOUNTER — Ambulatory Visit: Payer: Medicaid Other | Admitting: Physical Medicine & Rehabilitation

## 2011-11-29 ENCOUNTER — Ambulatory Visit: Payer: Medicaid Other | Admitting: Physical Medicine & Rehabilitation

## 2011-11-29 ENCOUNTER — Encounter: Payer: Self-pay | Admitting: Vascular Surgery

## 2011-11-30 ENCOUNTER — Other Ambulatory Visit: Payer: Medicaid Other

## 2011-11-30 ENCOUNTER — Ambulatory Visit: Payer: Medicaid Other | Admitting: Vascular Surgery

## 2011-12-04 ENCOUNTER — Encounter: Payer: Medicaid Other | Attending: Physical Medicine & Rehabilitation

## 2011-12-04 ENCOUNTER — Ambulatory Visit (HOSPITAL_BASED_OUTPATIENT_CLINIC_OR_DEPARTMENT_OTHER): Payer: Medicaid Other | Admitting: Physical Medicine & Rehabilitation

## 2011-12-04 ENCOUNTER — Encounter: Payer: Self-pay | Admitting: Physical Medicine & Rehabilitation

## 2011-12-04 VITALS — BP 144/84 | HR 87 | Resp 14 | Ht 67.0 in | Wt 223.0 lb

## 2011-12-04 DIAGNOSIS — H539 Unspecified visual disturbance: Secondary | ICD-10-CM | POA: Insufficient documentation

## 2011-12-04 DIAGNOSIS — I10 Essential (primary) hypertension: Secondary | ICD-10-CM | POA: Insufficient documentation

## 2011-12-04 DIAGNOSIS — E119 Type 2 diabetes mellitus without complications: Secondary | ICD-10-CM | POA: Insufficient documentation

## 2011-12-04 DIAGNOSIS — H5340 Unspecified visual field defects: Secondary | ICD-10-CM | POA: Insufficient documentation

## 2011-12-04 DIAGNOSIS — I69959 Hemiplegia and hemiparesis following unspecified cerebrovascular disease affecting unspecified side: Secondary | ICD-10-CM | POA: Insufficient documentation

## 2011-12-04 DIAGNOSIS — I69998 Other sequelae following unspecified cerebrovascular disease: Secondary | ICD-10-CM | POA: Insufficient documentation

## 2011-12-04 DIAGNOSIS — M7500 Adhesive capsulitis of unspecified shoulder: Secondary | ICD-10-CM

## 2011-12-04 DIAGNOSIS — M7530 Calcific tendinitis of unspecified shoulder: Secondary | ICD-10-CM

## 2011-12-04 DIAGNOSIS — M19019 Primary osteoarthritis, unspecified shoulder: Secondary | ICD-10-CM

## 2011-12-04 NOTE — Patient Instructions (Signed)
Do your range of motion exercises for your left arm the way that the therapist taught you. Do them every day. If your shoulder gets worse like it was in April please call my office to set up another injection.

## 2011-12-04 NOTE — Progress Notes (Signed)
Subjective:    Patient ID: [name], female    DOB: 1960/07/16, 51 y.o.   MRN: [id number]  HPI 51 year old female with right MCA infarct was last seen by me in April 2013 for a left shoulder injection. She has a calcific tendinitis of her supraspinatus tendon as well as a.c. Joint arthritis. Her shoulder pain is better and in fact does not have any pain when she is at rest. She still has pain with attempted movement. She is undergoing treatment from both vascular surgery as well as orthopedics for her left foot ischemic changes. She resides at a skilled nursing facility [facility] Pain Inventory Average Pain 5 Pain Right Now 5 My pain is aching  In the last 24 hours, has pain interfered with the following? General activity 10 Relation with others 10 Enjoyment of life 10 What TIME of day is your pain at its worst? varie Sleep (in general) Fair  Pain is worse with: walking and some activites Pain improves with: medication Relief from Meds: 5  Mobility use a wheelchair  Function I need assistance with the following:  feeding, dressing, bathing, toileting, meal prep, household duties and shopping  Neuro/Psych weakness numbness trouble walking anxiety  Prior Studies Any changes since last visit?  no  Physicians involved in your care Any changes since last visit?  no   Family History  Problem Relation Age of Onset  . Coronary artery disease    . Diabetes    . Cancer Mother   . Diabetes Mother   . Heart disease Father   . Hyperlipidemia Father   . Hypertension Father   . Stroke Father   . Heart disease Brother   . Anesthesia problems Neg Hx   . Hypotension Neg Hx   . Malignant hyperthermia Neg Hx   . Pseudochol deficiency Neg Hx    History   Social History  . Marital Status: Divorced    Spouse Name: N/A    Number of Children: N/A  . Years of Education: N/A   Social History Main Topics  . Smoking status: Never Smoker   . Smokeless tobacco:  Never Used  . Alcohol Use: Yes     rare on holidays  . Drug Use: No  . Sexually Active: Not Currently   Other Topics Concern  . None   Social History Narrative   DivorcedOne of 10 children5 in [place] and was living with sister at time of CVANonsmokerNondrinker   Past Surgical History  Procedure Date  . Tympanoplasty   . Toe amputation     left 2nd toe  . Dilation and curettage of uterus     history  . Tee without cardioversion 04/25/2011    Procedure: TRANSESOPHAGEAL ECHOCARDIOGRAM (TEE);  Surgeon: Jolaine Artist, MD;  Location: [facility] ENDOSCOPY;  Service: Cardiovascular;  Laterality: N/A;  . Esophagogastroduodenoscopy 08/14/2011    Procedure: ESOPHAGOGASTRODUODENOSCOPY (EGD);  Surgeon: Scarlette Shorts, MD;  Location: [facility] ENDOSCOPY;  Service: Endoscopy;  Laterality: N/A;   Past Medical History  Diagnosis Date  . HTN (hypertension)   . HLD (hyperlipidemia)   . History of methicillin resistant staphylococcus aureus (MRSA)   . CVA (cerebral infarction)   . Anemia   . Tachycardia   . Obesity   . Osteomyelitis   . Lower back pain     Chronic  . Idiopathic peripheral neuropathy     NOS  . DJD (degenerative joint disease)   . Headache   . GERD (gastroesophageal reflux disease)   .  Depression   . Anxiety   . Hypokalemia     resolved  . Cerebral infarct     left parietal and bilateral  . Esophagitis     distal  . Fluid overload     compensated  . Urinary retention   . H/O: pneumonia   . History of bacteremia   . Surgery, other elective     of the ear  . Stroke     Hx of left frontoparietal stroke in 12/11. History of right brain stroke.  . Cardiomyopathy     Echocardiogram 04/23/11: Mild LVH, EF 30-35%, mild MR, mild LAE, mild-moderate RVE.  TEE 04/25/11: Moderate LVH, severe biventricular dysfunction, EF 20-25%, moderate to severe MR, moderate TR, biatrial enlargement, no PFO, negative bubble study  . Shortness of breath     with exertion   . Sleep apnea     no machine used   pt does not remember test for sleep apnea  . DM type 2 (diabetes mellitus, type 2)     type two   oral meds only   . 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 UTI (lower urinary tract infection) 10/19/11    had approx April 2013   BP 144/84  Pulse 87  Resp 14  Ht 5\' 7"  (1.702 m)  Wt 223 lb (101.152 kg)  BMI 34.93 kg/m2  SpO2 94%     Review of Systems  Musculoskeletal: Positive for gait problem.  Neurological: Positive for weakness and numbness.  Psychiatric/Behavioral: The patient is nervous/anxious.   All other systems reviewed and are negative.       Objective:   Physical Exam  Neurological: She displays atrophy. A sensory deficit is present. She exhibits abnormal muscle tone. Gait abnormal.       Patient is nonambulatory Right shoulder has pain with internal rotation, external rotation, abduction for flexion. Range of motion is limited to 50% in all directions in the shoulder as well as MCPs PIPs.           Assessment & Plan:  1. Right middle cerebral artery infarct with left hemiparesis and left neglect and left-sided hemisensory deficits. Chronic and has plateaued in function 2. Left shoulder pain due to frozen shoulder, calcific tendinitis, and a.c. Joint arthritis. Currently he just has pain with range of motion. Should she have rest pain we can repeat her injection. Return to clinic when necessary

## 2011-12-06 ENCOUNTER — Encounter: Payer: Self-pay | Admitting: Vascular Surgery

## 2011-12-07 ENCOUNTER — Encounter: Payer: Self-pay | Admitting: Vascular Surgery

## 2011-12-07 ENCOUNTER — Ambulatory Visit (INDEPENDENT_AMBULATORY_CARE_PROVIDER_SITE_OTHER): Payer: Medicaid Other | Admitting: Vascular Surgery

## 2011-12-07 VITALS — BP 168/95 | HR 85 | Temp 98.3°F | Ht 67.0 in | Wt 232.0 lb

## 2011-12-07 DIAGNOSIS — I739 Peripheral vascular disease, unspecified: Secondary | ICD-10-CM

## 2011-12-07 DIAGNOSIS — L98499 Non-pressure chronic ulcer of skin of other sites with unspecified severity: Secondary | ICD-10-CM

## 2011-12-07 NOTE — Progress Notes (Signed)
[facility] [place]  Postoperative Visit  History of Present Illness  [name] is a 51 y.o. female who presents for postoperative follow-up for: BLE angiogram (Date: 10/18/11).  The patient's wounds are not healing but she notes continued bleeding from the gangrene toes.  She denies any fever or chills or drainage.  Dr. Sharol Given is recommending L BKA.  Past Medical History, Past Surgical History, Social History, Family History, Medications, Allergies, and Review of Systems are unchanged from previous evaluation on 10/18/11.  Physical Examination  Filed Vitals:   12/07/11 1030  BP: 168/95  Pulse: 85  Temp: 98.3 F (36.8 C)  TempSrc: Oral  Height: 5\' 7"  (1.702 m)  Weight: 232 lb (105.235 kg)  SpO2: 100%    General: A&O x 3, some facial asx (L facial droop)  Pulmonary: Sym exp, good air movt, CTAB, no rales, rhonchi, & wheezing  Cardiac: RRR, Nl S1, S2, no Murmurs, rubs or gallops  Vascular: Vessel Right Left  Radial Palpable Palpable  Ulnary Palpable Palpable  Brachial Palpable Palpable  Carotid Palpable, without bruit Palpable, without bruit  Aorta Non-palpable N/A  Femoral Palpable Palpable  Popliteal Non-palpable Non-palpable  PT Non-Palpable Non-Palpable  DP Non-Palpable Non-Palpable   Gastrointestinal: soft, NTND, -G/R, - HSM, - masses, - CVAT B  Musculoskeletal: M/S 5/5 R side, 2-3/5 L side: able to lift leg against gravity and flex knee somewhat, Extremities without ischemic changes except left great toe ulcer improved without slough of echar, 2nd toe previously amputation, 3rd toe with sloughed eschar at joint, 4th toe with sloughed eschar with atrophy , 5th toe with sloughed eschar and atrophy, lateral malleolus ulcer with sloughed, some bleeding evident in sloughed areas, no pus or smell  Medical Decision Making  [name] is a 51 y.o. female who presents s/p BLE angiogram. Based on his angiographic findings, this patient needs: L BKA.   There is no revascularization options.  I agree with Dr. Sharol Given and will defer amputation to Dr. Sharol Given at family's wishes.  Thank you for allowing [place] to participate in this patient's care.  Adele Barthel, MD [facility] [place] Office: [phone] Pager: [phone]

## 2011-12-10 ENCOUNTER — Ambulatory Visit: Payer: Medicaid Other | Admitting: Cardiovascular Disease

## 2012-01-17 ENCOUNTER — Encounter (HOSPITAL_COMMUNITY): Payer: Self-pay | Admitting: *Deleted

## 2012-01-17 ENCOUNTER — Other Ambulatory Visit (HOSPITAL_COMMUNITY): Payer: Self-pay | Admitting: Orthopedic Surgery

## 2012-01-17 ENCOUNTER — Encounter (HOSPITAL_COMMUNITY): Payer: Self-pay | Admitting: Pharmacy Technician

## 2012-01-17 MED ORDER — CEFAZOLIN SODIUM-DEXTROSE 2-3 GM-% IV SOLR
2.0000 g | INTRAVENOUS | Status: AC
  Administered 2012-01-18: 2 g via INTRAVENOUS
  Filled 2012-01-17: qty 50

## 2012-01-17 NOTE — Progress Notes (Addendum)
LM for [facility] in Dr. Jess Barters office requesting orders, also need instructions re pt's coumadin.  Per Malachy Mood may give coumadin if INR below 2.0, hold if greater than 2.0. Per nurse Roderic Ovens at [facility] pt's INR was 3.64 on 01/16/12 and coumadin is being held; if restarted dose will be 2.5 mg per day. Notified [facility] nurse to continue to hold coumadin.

## 2012-01-18 ENCOUNTER — Encounter (HOSPITAL_COMMUNITY): Payer: Self-pay | Admitting: Anesthesiology

## 2012-01-18 ENCOUNTER — Encounter (HOSPITAL_COMMUNITY): Payer: Self-pay | Admitting: *Deleted

## 2012-01-18 ENCOUNTER — Inpatient Hospital Stay (HOSPITAL_COMMUNITY)
Admission: RE | Admit: 2012-01-18 | Discharge: 2012-01-21 | DRG: 240 | Disposition: A | Discharge status: Skilled Nursing Facility | Payer: Medicaid Other | Source: Ambulatory Visit | Attending: Orthopedic Surgery | Admitting: Orthopedic Surgery

## 2012-01-18 ENCOUNTER — Ambulatory Visit (HOSPITAL_COMMUNITY): Payer: Medicaid Other | Admitting: Anesthesiology

## 2012-01-18 ENCOUNTER — Encounter (HOSPITAL_COMMUNITY)
Admission: RE | Disposition: A | Discharge status: Skilled Nursing Facility | Payer: Self-pay | Source: Ambulatory Visit | Attending: Orthopedic Surgery

## 2012-01-18 DIAGNOSIS — G8929 Other chronic pain: Secondary | ICD-10-CM | POA: Diagnosis present

## 2012-01-18 DIAGNOSIS — F329 Major depressive disorder, single episode, unspecified: Secondary | ICD-10-CM | POA: Diagnosis present

## 2012-01-18 DIAGNOSIS — L97509 Non-pressure chronic ulcer of other part of unspecified foot with unspecified severity: Secondary | ICD-10-CM | POA: Diagnosis present

## 2012-01-18 DIAGNOSIS — Z8701 Personal history of pneumonia (recurrent): Secondary | ICD-10-CM

## 2012-01-18 DIAGNOSIS — M545 Low back pain, unspecified: Secondary | ICD-10-CM | POA: Diagnosis present

## 2012-01-18 DIAGNOSIS — N390 Urinary tract infection, site not specified: Secondary | ICD-10-CM

## 2012-01-18 DIAGNOSIS — I739 Peripheral vascular disease, unspecified: Secondary | ICD-10-CM

## 2012-01-18 DIAGNOSIS — F3289 Other specified depressive episodes: Secondary | ICD-10-CM | POA: Diagnosis present

## 2012-01-18 DIAGNOSIS — I428 Other cardiomyopathies: Secondary | ICD-10-CM | POA: Diagnosis present

## 2012-01-18 DIAGNOSIS — E1149 Type 2 diabetes mellitus with other diabetic neurological complication: Secondary | ICD-10-CM | POA: Diagnosis present

## 2012-01-18 DIAGNOSIS — Z888 Allergy status to other drugs, medicaments and biological substances status: Secondary | ICD-10-CM

## 2012-01-18 DIAGNOSIS — E669 Obesity, unspecified: Secondary | ICD-10-CM | POA: Diagnosis present

## 2012-01-18 DIAGNOSIS — Z8249 Family history of ischemic heart disease and other diseases of the circulatory system: Secondary | ICD-10-CM

## 2012-01-18 DIAGNOSIS — F411 Generalized anxiety disorder: Secondary | ICD-10-CM | POA: Diagnosis present

## 2012-01-18 DIAGNOSIS — Z833 Family history of diabetes mellitus: Secondary | ICD-10-CM

## 2012-01-18 DIAGNOSIS — G4730 Sleep apnea, unspecified: Secondary | ICD-10-CM | POA: Diagnosis present

## 2012-01-18 DIAGNOSIS — M1990 Unspecified osteoarthritis, unspecified site: Secondary | ICD-10-CM | POA: Diagnosis present

## 2012-01-18 DIAGNOSIS — Z794 Long term (current) use of insulin: Secondary | ICD-10-CM

## 2012-01-18 DIAGNOSIS — Z91040 Latex allergy status: Secondary | ICD-10-CM

## 2012-01-18 DIAGNOSIS — E119 Type 2 diabetes mellitus without complications: Secondary | ICD-10-CM

## 2012-01-18 DIAGNOSIS — M9080 Osteopathy in diseases classified elsewhere, unspecified site: Secondary | ICD-10-CM | POA: Diagnosis present

## 2012-01-18 DIAGNOSIS — E785 Hyperlipidemia, unspecified: Secondary | ICD-10-CM | POA: Diagnosis present

## 2012-01-18 DIAGNOSIS — Z6833 Body mass index (BMI) 33.0-33.9, adult: Secondary | ICD-10-CM

## 2012-01-18 DIAGNOSIS — I059 Rheumatic mitral valve disease, unspecified: Secondary | ICD-10-CM | POA: Diagnosis present

## 2012-01-18 DIAGNOSIS — K219 Gastro-esophageal reflux disease without esophagitis: Secondary | ICD-10-CM | POA: Diagnosis present

## 2012-01-18 DIAGNOSIS — E1169 Type 2 diabetes mellitus with other specified complication: Secondary | ICD-10-CM | POA: Diagnosis present

## 2012-01-18 DIAGNOSIS — S98139A Complete traumatic amputation of one unspecified lesser toe, initial encounter: Secondary | ICD-10-CM

## 2012-01-18 DIAGNOSIS — I10 Essential (primary) hypertension: Secondary | ICD-10-CM | POA: Diagnosis present

## 2012-01-18 DIAGNOSIS — E1142 Type 2 diabetes mellitus with diabetic polyneuropathy: Secondary | ICD-10-CM | POA: Diagnosis present

## 2012-01-18 DIAGNOSIS — I70269 Atherosclerosis of native arteries of extremities with gangrene, unspecified extremity: Secondary | ICD-10-CM | POA: Diagnosis present

## 2012-01-18 DIAGNOSIS — Z881 Allergy status to other antibiotic agents status: Secondary | ICD-10-CM

## 2012-01-18 DIAGNOSIS — Z8614 Personal history of Methicillin resistant Staphylococcus aureus infection: Secondary | ICD-10-CM

## 2012-01-18 DIAGNOSIS — M869 Osteomyelitis, unspecified: Secondary | ICD-10-CM

## 2012-01-18 DIAGNOSIS — Z8673 Personal history of transient ischemic attack (TIA), and cerebral infarction without residual deficits: Secondary | ICD-10-CM

## 2012-01-18 DIAGNOSIS — E1159 Type 2 diabetes mellitus with other circulatory complications: Principal | ICD-10-CM | POA: Diagnosis present

## 2012-01-18 HISTORY — PX: AMPUTATION: SHX166

## 2012-01-18 LAB — COMPREHENSIVE METABOLIC PANEL
AST: 22 U/L (ref 0–37)
Albumin: 2.2 g/dL — ABNORMAL LOW (ref 3.5–5.2)
Calcium: 9.4 mg/dL (ref 8.4–10.5)
Chloride: 107 mEq/L (ref 96–112)
Creatinine, Ser: 1.28 mg/dL — ABNORMAL HIGH (ref 0.50–1.10)
Total Bilirubin: 0.3 mg/dL (ref 0.3–1.2)

## 2012-01-18 LAB — CBC
MCV: 90.1 fL (ref 78.0–100.0)
Platelets: 408 10*3/uL — ABNORMAL HIGH (ref 150–400)
RDW: 13.4 % (ref 11.5–15.5)
WBC: 6.6 10*3/uL (ref 4.0–10.5)

## 2012-01-18 LAB — GLUCOSE, CAPILLARY
Glucose-Capillary: 97 mg/dL (ref 70–99)
Glucose-Capillary: 90 mg/dL (ref 70–99)
Glucose-Capillary: 90 mg/dL (ref 70–99)
Glucose-Capillary: 119 mg/dL — ABNORMAL HIGH (ref 70–99)

## 2012-01-18 LAB — PROTIME-INR: INR: 1.39 (ref 0.00–1.49)

## 2012-01-18 LAB — APTT: aPTT: 50 seconds — ABNORMAL HIGH (ref 24–37)

## 2012-01-18 LAB — SURGICAL PCR SCREEN: MRSA, PCR: NEGATIVE

## 2012-01-18 SURGERY — AMPUTATION BELOW KNEE
Anesthesia: General | Site: Leg Lower | Laterality: Left | Wound class: Dirty or Infected

## 2012-01-18 MED ORDER — ONDANSETRON HCL 4 MG/2ML IJ SOLN: 4.0000 mg | Freq: Four times a day (QID) | INTRAMUSCULAR | Status: DC | PRN

## 2012-01-18 MED ORDER — WARFARIN SODIUM 5 MG PO TABS
5.0000 mg | ORAL_TABLET | Freq: Once | ORAL | Status: AC
  Administered 2012-01-18: 5 mg via ORAL
  Filled 2012-01-18: qty 1

## 2012-01-18 MED ORDER — METHOCARBAMOL 100 MG/ML IJ SOLN
500.0000 mg | Freq: Four times a day (QID) | INTRAVENOUS | Status: DC | PRN
  Filled 2012-01-18: qty 5

## 2012-01-18 MED ORDER — MUPIROCIN 2 % EX OINT
TOPICAL_OINTMENT | CUTANEOUS | Status: AC
  Administered 2012-01-18: 1 via NASAL
  Filled 2012-01-18: qty 22

## 2012-01-18 MED ORDER — HYDROMORPHONE HCL PF 1 MG/ML IJ SOLN
INTRAMUSCULAR | Status: AC
  Administered 2012-01-18: 0.5 mg via INTRAVENOUS
  Filled 2012-01-18: qty 1

## 2012-01-18 MED ORDER — BIOTENE DRY MOUTH MT LIQD
15.0000 mL | Freq: Two times a day (BID) | OROMUCOSAL | Status: DC
  Administered 2012-01-18 – 2012-01-21 (×6): 15 mL via OROMUCOSAL
  Filled 2012-01-18: qty 15

## 2012-01-18 MED ORDER — TEMAZEPAM 7.5 MG PO CAPS: 7.5000 mg | ORAL_CAPSULE | Freq: Every day | ORAL | Status: DC

## 2012-01-18 MED ORDER — INSULIN ASPART 100 UNIT/ML ~~LOC~~ SOLN
0.0000 [IU] | Freq: Three times a day (TID) | SUBCUTANEOUS | Status: DC
  Administered 2012-01-19: 4 [IU] via SUBCUTANEOUS
  Administered 2012-01-20: 2 [IU] via SUBCUTANEOUS

## 2012-01-18 MED ORDER — METOCLOPRAMIDE HCL 5 MG/ML IJ SOLN: 5.0000 mg | Freq: Three times a day (TID) | INTRAMUSCULAR | Status: DC | PRN

## 2012-01-18 MED ORDER — MIDAZOLAM HCL 5 MG/5ML IJ SOLN
INTRAMUSCULAR | Status: DC | PRN
  Administered 2012-01-18: 1 mg via INTRAVENOUS

## 2012-01-18 MED ORDER — HYDROMORPHONE 0.3 MG/ML IV SOLN
INTRAVENOUS | Status: DC
  Administered 2012-01-18: 21:00:00 via INTRAVENOUS
  Administered 2012-01-19: 1.8 mg via INTRAVENOUS
  Administered 2012-01-19: 0.8 mg via INTRAVENOUS
  Filled 2012-01-18: qty 25

## 2012-01-18 MED ORDER — CITALOPRAM HYDROBROMIDE 10 MG PO TABS
10.0000 mg | ORAL_TABLET | Freq: Every day | ORAL | Status: DC
  Administered 2012-01-19 – 2012-01-21 (×3): 10 mg via ORAL
  Filled 2012-01-18 (×4): qty 1

## 2012-01-18 MED ORDER — HYDROCODONE-ACETAMINOPHEN 5-325 MG PO TABS
1.0000 | ORAL_TABLET | ORAL | Status: DC | PRN
  Administered 2012-01-19: 1 via ORAL
  Administered 2012-01-20 (×2): 2 via ORAL
  Administered 2012-01-20 – 2012-01-21 (×2): 1 via ORAL
  Filled 2012-01-18 (×2): qty 2
  Filled 2012-01-18 (×3): qty 1
  Filled 2012-01-18: qty 2

## 2012-01-18 MED ORDER — CYCLOSPORINE 0.05 % OP EMUL
1.0000 [drp] | Freq: Two times a day (BID) | OPHTHALMIC | Status: DC
  Administered 2012-01-18 – 2012-01-20 (×4): 1 [drp] via OPHTHALMIC
  Administered 2012-01-20: 23:00:00 via OPHTHALMIC
  Administered 2012-01-21: 1 [drp] via OPHTHALMIC
  Filled 2012-01-18 (×7): qty 1

## 2012-01-18 MED ORDER — WARFARIN - PHARMACIST DOSING INPATIENT: Freq: Every day | Status: DC

## 2012-01-18 MED ORDER — LACTATED RINGERS IV SOLN
INTRAVENOUS | Status: DC | PRN
  Administered 2012-01-18: 16:00:00 via INTRAVENOUS

## 2012-01-18 MED ORDER — INSULIN ASPART 100 UNIT/ML ~~LOC~~ SOLN
4.0000 [IU] | Freq: Three times a day (TID) | SUBCUTANEOUS | Status: DC
  Administered 2012-01-19 – 2012-01-20 (×5): 4 [IU] via SUBCUTANEOUS

## 2012-01-18 MED ORDER — PROPOFOL 10 MG/ML IV EMUL
INTRAVENOUS | Status: DC | PRN
  Administered 2012-01-18: 100 mg via INTRAVENOUS

## 2012-01-18 MED ORDER — HYDROMORPHONE HCL PF 1 MG/ML IJ SOLN
0.2500 mg | INTRAMUSCULAR | Status: DC | PRN
  Administered 2012-01-18 (×2): 0.5 mg via INTRAVENOUS

## 2012-01-18 MED ORDER — SODIUM CHLORIDE 0.9 % IJ SOLN: 9.0000 mL | INTRAMUSCULAR | Status: DC | PRN

## 2012-01-18 MED ORDER — ALPRAZOLAM 0.25 MG PO TABS
0.2500 mg | ORAL_TABLET | Freq: Three times a day (TID) | ORAL | Status: DC | PRN
  Administered 2012-01-19 – 2012-01-20 (×2): 0.25 mg via ORAL
  Filled 2012-01-18 (×2): qty 1

## 2012-01-18 MED ORDER — OXYCODONE-ACETAMINOPHEN 5-325 MG PO TABS
1.0000 | ORAL_TABLET | ORAL | Status: DC | PRN
  Administered 2012-01-20 – 2012-01-21 (×2): 1 via ORAL
  Filled 2012-01-18 (×2): qty 1

## 2012-01-18 MED ORDER — NALOXONE HCL 0.4 MG/ML IJ SOLN: 0.4000 mg | INTRAMUSCULAR | Status: DC | PRN

## 2012-01-18 MED ORDER — METHYLPHENIDATE HCL 5 MG PO TABS
10.0000 mg | ORAL_TABLET | Freq: Two times a day (BID) | ORAL | Status: DC
  Administered 2012-01-19 – 2012-01-21 (×4): 10 mg via ORAL
  Filled 2012-01-18 (×4): qty 2

## 2012-01-18 MED ORDER — METOCLOPRAMIDE HCL 10 MG PO TABS: 5.0000 mg | ORAL_TABLET | Freq: Three times a day (TID) | ORAL | Status: DC | PRN

## 2012-01-18 MED ORDER — MUPIROCIN 2 % EX OINT
TOPICAL_OINTMENT | CUTANEOUS | Status: AC
  Filled 2012-01-18: qty 22

## 2012-01-18 MED ORDER — SODIUM CHLORIDE 0.9 % IV SOLN
INTRAVENOUS | Status: DC
  Administered 2012-01-18: 22:00:00 via INTRAVENOUS

## 2012-01-18 MED ORDER — MUPIROCIN 2 % EX OINT
TOPICAL_OINTMENT | Freq: Once | CUTANEOUS | Status: AC
  Administered 2012-01-18: 1 via NASAL

## 2012-01-18 MED ORDER — FERROUS SULFATE 325 (65 FE) MG PO TABS
325.0000 mg | ORAL_TABLET | Freq: Two times a day (BID) | ORAL | Status: DC
  Administered 2012-01-18 – 2012-01-21 (×5): 325 mg via ORAL
  Filled 2012-01-18 (×8): qty 1

## 2012-01-18 MED ORDER — METHOCARBAMOL 500 MG PO TABS
500.0000 mg | ORAL_TABLET | Freq: Four times a day (QID) | ORAL | Status: DC | PRN
  Filled 2012-01-18: qty 1

## 2012-01-18 MED ORDER — INSULIN GLARGINE 100 UNIT/ML ~~LOC~~ SOLN
20.0000 [IU] | Freq: Every day | SUBCUTANEOUS | Status: DC
  Administered 2012-01-19: 20 [IU] via SUBCUTANEOUS
  Administered 2012-01-20: 5 [IU] via SUBCUTANEOUS

## 2012-01-18 MED ORDER — HYDROMORPHONE HCL PF 1 MG/ML IJ SOLN
0.5000 mg | INTRAMUSCULAR | Status: DC | PRN
  Administered 2012-01-19: 1 mg via INTRAVENOUS
  Filled 2012-01-18: qty 1

## 2012-01-18 MED ORDER — DIPHENHYDRAMINE HCL 12.5 MG/5ML PO ELIX: 12.5000 mg | ORAL_SOLUTION | Freq: Four times a day (QID) | ORAL | Status: DC | PRN

## 2012-01-18 MED ORDER — ATORVASTATIN CALCIUM 20 MG PO TABS
20.0000 mg | ORAL_TABLET | Freq: Every day | ORAL | Status: DC
  Administered 2012-01-18 – 2012-01-20 (×3): 20 mg via ORAL
  Filled 2012-01-18 (×4): qty 1

## 2012-01-18 MED ORDER — ONDANSETRON HCL 4 MG PO TABS: 4.0000 mg | ORAL_TABLET | Freq: Four times a day (QID) | ORAL | Status: DC | PRN

## 2012-01-18 MED ORDER — 0.9 % SODIUM CHLORIDE (POUR BTL) OPTIME
TOPICAL | Status: DC | PRN
  Administered 2012-01-18: 1000 mL

## 2012-01-18 MED ORDER — FENTANYL CITRATE 0.05 MG/ML IJ SOLN
INTRAMUSCULAR | Status: DC | PRN
  Administered 2012-01-18: 50 ug via INTRAVENOUS

## 2012-01-18 MED ORDER — ONDANSETRON HCL 4 MG/2ML IJ SOLN
INTRAMUSCULAR | Status: DC | PRN
  Administered 2012-01-18: 4 mg via INTRAVENOUS

## 2012-01-18 MED ORDER — DIPHENHYDRAMINE HCL 50 MG/ML IJ SOLN: 12.5000 mg | Freq: Four times a day (QID) | INTRAMUSCULAR | Status: DC | PRN

## 2012-01-18 MED ORDER — METHOCARBAMOL 500 MG PO TABS
500.0000 mg | ORAL_TABLET | Freq: Four times a day (QID) | ORAL | Status: DC | PRN
  Administered 2012-01-19 – 2012-01-20 (×3): 500 mg via ORAL
  Filled 2012-01-18 (×2): qty 1

## 2012-01-18 MED ORDER — PRO-STAT SUGAR FREE PO LIQD
30.0000 mL | Freq: Three times a day (TID) | ORAL | Status: DC
  Administered 2012-01-19 – 2012-01-21 (×8): 30 mL via ORAL
  Filled 2012-01-18 (×10): qty 30

## 2012-01-18 MED ORDER — AMLODIPINE BESYLATE 5 MG PO TABS
5.0000 mg | ORAL_TABLET | Freq: Every day | ORAL | Status: DC
  Administered 2012-01-19 – 2012-01-21 (×3): 5 mg via ORAL
  Filled 2012-01-18 (×3): qty 1

## 2012-01-18 MED ORDER — METOPROLOL TARTRATE 100 MG PO TABS
100.0000 mg | ORAL_TABLET | Freq: Two times a day (BID) | ORAL | Status: DC
  Administered 2012-01-18 – 2012-01-21 (×6): 100 mg via ORAL
  Filled 2012-01-18 (×7): qty 1

## 2012-01-18 MED ORDER — GABAPENTIN 300 MG PO CAPS
600.0000 mg | ORAL_CAPSULE | Freq: Four times a day (QID) | ORAL | Status: DC
  Administered 2012-01-18 – 2012-01-21 (×11): 600 mg via ORAL
  Filled 2012-01-18 (×14): qty 2

## 2012-01-18 MED ORDER — LIDOCAINE HCL (CARDIAC) 20 MG/ML IV SOLN
INTRAVENOUS | Status: DC | PRN
  Administered 2012-01-18: 50 mg via INTRAVENOUS

## 2012-01-18 MED ORDER — CEFAZOLIN SODIUM-DEXTROSE 2-3 GM-% IV SOLR
2.0000 g | Freq: Four times a day (QID) | INTRAVENOUS | Status: AC
  Administered 2012-01-18 – 2012-01-19 (×3): 2 g via INTRAVENOUS
  Filled 2012-01-18 (×3): qty 50

## 2012-01-18 MED ORDER — LACTATED RINGERS IV SOLN
INTRAVENOUS | Status: DC
  Administered 2012-01-18: 15:00:00 via INTRAVENOUS

## 2012-01-18 SURGICAL SUPPLY — 45 items
BANDAGE ESMARK 6X9 LF (GAUZE/BANDAGES/DRESSINGS) IMPLANT
BANDAGE GAUZE ELAST BULKY 4 IN (GAUZE/BANDAGES/DRESSINGS) ×2 IMPLANT
BLADE SAW RECIP 87.9 MT (BLADE) ×2 IMPLANT
BLADE SURG 21 STRL SS (BLADE) ×2 IMPLANT
BNDG CMPR 9X6 STRL LF SNTH (GAUZE/BANDAGES/DRESSINGS)
BNDG COHESIVE 6X5 TAN STRL LF (GAUZE/BANDAGES/DRESSINGS) ×2 IMPLANT
BNDG ESMARK 6X9 LF (GAUZE/BANDAGES/DRESSINGS)
CLOTH BEACON ORANGE TIMEOUT ST (SAFETY) ×2 IMPLANT
COVER SURGICAL LIGHT HANDLE (MISCELLANEOUS) ×2 IMPLANT
CUFF TOURNIQUET SINGLE 34IN LL (TOURNIQUET CUFF) ×2 IMPLANT
CUFF TOURNIQUET SINGLE 44IN (TOURNIQUET CUFF) IMPLANT
DRAIN PENROSE 1/2X12 LTX STRL (WOUND CARE) IMPLANT
DRAPE EXTREMITY T 121X128X90 (DRAPE) ×2 IMPLANT
DRAPE PROXIMA HALF (DRAPES) ×4 IMPLANT
DRAPE U-SHAPE 47X51 STRL (DRAPES) ×4 IMPLANT
DRSG ADAPTIC 3X8 NADH LF (GAUZE/BANDAGES/DRESSINGS) ×2 IMPLANT
DRSG PAD ABDOMINAL 8X10 ST (GAUZE/BANDAGES/DRESSINGS) ×6 IMPLANT
DURAPREP 26ML APPLICATOR (WOUND CARE) ×2 IMPLANT
ELECT REM PT RETURN 9FT ADLT (ELECTROSURGICAL) ×2
ELECTRODE REM PT RTRN 9FT ADLT (ELECTROSURGICAL) ×1 IMPLANT
EVACUATOR 1/8 PVC DRAIN (DRAIN) IMPLANT
GLOVE BIOGEL PI IND STRL 9 (GLOVE) ×1 IMPLANT
GLOVE BIOGEL PI INDICATOR 9 (GLOVE) ×1
GLOVE SURG ORTHO 9.0 STRL STRW (GLOVE) IMPLANT
GOWN PREVENTION PLUS XLARGE (GOWN DISPOSABLE) ×2 IMPLANT
GOWN SRG XL XLNG 56XLVL 4 (GOWN DISPOSABLE) ×1 IMPLANT
GOWN STRL NON-REIN XL XLG LVL4 (GOWN DISPOSABLE) ×1
KIT BASIN OR (CUSTOM PROCEDURE TRAY) ×2 IMPLANT
KIT ROOM TURNOVER OR (KITS) ×2 IMPLANT
MANIFOLD NEPTUNE II (INSTRUMENTS) ×2 IMPLANT
NS IRRIG 1000ML POUR BTL (IV SOLUTION) ×2 IMPLANT
PACK GENERAL/GYN (CUSTOM PROCEDURE TRAY) ×2 IMPLANT
PAD ARMBOARD 7.5X6 YLW CONV (MISCELLANEOUS) ×6 IMPLANT
SPONGE GAUZE 4X4 12PLY (GAUZE/BANDAGES/DRESSINGS) ×2 IMPLANT
SPONGE LAP 18X18 X RAY DECT (DISPOSABLE) ×2 IMPLANT
STAPLER VISISTAT 35W (STAPLE) ×2 IMPLANT
STOCKINETTE IMPERVIOUS LG (DRAPES) ×2 IMPLANT
SUT PDS AB 1 CT  36 (SUTURE) ×2
SUT PDS AB 1 CT 36 (SUTURE) ×2 IMPLANT
SUT SILK 2 0 (SUTURE) ×1
SUT SILK 2-0 18XBRD TIE 12 (SUTURE) ×1 IMPLANT
TOWEL OR 17X24 6PK STRL BLUE (TOWEL DISPOSABLE) ×2 IMPLANT
TOWEL OR 17X26 10 PK STRL BLUE (TOWEL DISPOSABLE) ×2 IMPLANT
TUBE ANAEROBIC SPECIMEN COL (MISCELLANEOUS) IMPLANT
WATER STERILE IRR 1000ML POUR (IV SOLUTION) IMPLANT

## 2012-01-18 NOTE — Op Note (Signed)
OPERATIVE REPORT  DATE OF SURGERY: 01/18/2012  PATIENT:  [name],  51 y.o. female  PRE-OPERATIVE DIAGNOSIS:  Gangrene Left Foot  POST-OPERATIVE DIAGNOSIS:  gangrene left foot  PROCEDURE:  Procedure(s): AMPUTATION BELOW KNEE  SURGEON:  Surgeon(s): Newt Minion, MD  ANESTHESIA:   general  EBL:  Min ML  SPECIMEN:  Source of Specimen:  left leg  TOURNIQUET:   Total Tourniquet Time Documented: Thigh (Left) - 7 minutes  PROCEDURE DETAILS: Patient is a 51 year old woman with diabetes peripheral vascular disease status post stroke with gangrenous changes to the left foot she has failed foot salvage surgery presents at this time for left transtibial amputation. Risks and benefits were discussed including infection neurovascular injury persistent pain DVT pulmonary embolus need for additional surgery. Patient states she understands and wished to proceed at this time. Description of procedure patient was brought to OR and underwent a general anesthetic. After adequate levels and anesthesia obtained patient's left lower extremity was prepped using DuraPrep and draped into a sterile field the foot was draped out with an impervious stockinette was not exposed to the surgical field. A transverse incision was made 11 cm distal the tibial tubercle this curved proximally and a large posterior flap was created. The tibia was transected just proximal to the skin incision and the tibia was transected was beveled anteriorly. The fibula was transected just proximal to the tibial incision. The sciatic nerve was pulled cut and allowed to retract the vascular bundles were suture ligated x2 each with 2-0 silk. The patient had was used to create a large posterior flap. Hemostasis was obtained the deep and superficial fascial layers were closed using #1 PDS. The skin was closed using staples. The wound was covered with Adaptic orthopedic sponges AB dressing Kerlix and Coban. Patient was extubated and taken to  the PACU in stable condition.  PLAN OF CARE: Admit to inpatient   PATIENT DISPOSITION:  PACU - hemodynamically stable.   Newt Minion, MD 01/18/2012 5:21 PM

## 2012-01-18 NOTE — Progress Notes (Signed)
ANTICOAGULATION CONSULT NOTE - Initial Consult  Pharmacy Consult for warfarin Indication: Hx CVA + VTE prophylaxis s/p L BKA  Allergies  Allergen Reactions  . Ace Inhibitors Shortness Of Breath  . Latex Hives  . Lisinopril Other (See Comments)    Per MAR  . Vancomycin Other (See Comments)    Per [facility]    Patient Measurements: Height: 5\' 7"  (170.2 cm) Weight: 217 lb (98.431 kg) IBW/kg (Calculated) : 61.6   Vital Signs: Temp: 97.8 F (36.6 C) (08/09 1851) Temp src: Oral (08/09 1851) BP: 163/92 mmHg (08/09 1851) Pulse Rate: 78  (08/09 1851)  Labs:  Basename 01/18/12 1156  HGB 10.2*  HCT 31.8*  PLT 408*  APTT 50*  LABPROT 17.3*  INR 1.39  HEPARINUNFRC --  CREATININE 1.28*  CKTOTAL --  CKMB --  TROPONINI --    Estimated Creatinine Clearance: 64 ml/min (by C-G formula based on Cr of 1.28).   Medical History: Past Medical History  Diagnosis Date  . HTN (hypertension)   . HLD (hyperlipidemia)   . History of methicillin resistant staphylococcus aureus (MRSA)   . CVA (cerebral infarction)   . Anemia   . Tachycardia   . Obesity   . Osteomyelitis   . Lower back pain     Chronic  . Idiopathic peripheral neuropathy     NOS  . DJD (degenerative joint disease)   . Headache   . GERD (gastroesophageal reflux disease)   . Depression   . Anxiety   . Hypokalemia     resolved  . Cerebral infarct     left parietal and bilateral  . Esophagitis     distal  . Fluid overload     compensated  . Urinary retention   . H/O: pneumonia   . History of bacteremia   . Surgery, other elective     of the ear  . Stroke     Hx of left frontoparietal stroke in 12/11. History of right brain stroke.  . Cardiomyopathy     Echocardiogram 04/23/11: Mild LVH, EF 30-35%, mild MR, mild LAE, mild-moderate RVE.  TEE 04/25/11: Moderate LVH, severe biventricular dysfunction, EF 20-25%, moderate to severe MR, moderate TR, biatrial enlargement, no PFO, negative bubble study  . Shortness of  breath     with exertion   . Sleep apnea     no machine used  pt does not remember test for sleep apnea  . DM type 2 (diabetes mellitus, type 2)     type two   oral meds only   . 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 UTI (lower urinary tract infection) 10/19/11    had approx April 2013    Medications:  Prescriptions prior to admission  Medication Sig Dispense Refill  . ALPRAZolam (XANAX) 0.25 MG tablet Take 0.25 mg by mouth 3 (three) times daily as needed. For anxiety      . amLODipine (NORVASC) 5 MG tablet Take 5 mg by mouth daily.      Marland Kitchen antiseptic oral rinse (BIOTENE) LIQD 15 mLs by Mouth Rinse route 2 (two) times daily.      Marland Kitchen atorvastatin (LIPITOR) 20 MG tablet Take 20 mg by mouth at bedtime.      . citalopram (CELEXA) 10 MG tablet Take 10 mg by mouth daily.       . cycloSPORINE (RESTASIS) 0.05 % ophthalmic emulsion Place 1 drop into both eyes 2 (two) times daily.       Marland Kitchen  ferrous sulfate 325 (65 FE) MG tablet Take 325 mg by mouth 2 (two) times daily.       Marland Kitchen gabapentin (NEURONTIN) 300 MG capsule Take 600 mg by mouth 4 (four) times daily.      Marland Kitchen HYDROcodone-acetaminophen (NORCO) 5-325 MG per tablet Take 1 tablet by mouth every 4 (four) hours as needed. For pain      . insulin aspart (NOVOLOG) 100 UNIT/ML injection Inject 5 Units into the skin 4 (four) times daily -  before meals and at bedtime. 5 units for blood sugar>150      . methocarbamol (ROBAXIN) 500 MG tablet Take 500 mg by mouth every 6 (six) hours as needed. For muscle spasms.      . methylphenidate (RITALIN) 10 MG tablet Take 10 mg by mouth 2 (two) times daily.       . metoprolol (LOPRESSOR) 100 MG tablet Take 100 mg by mouth 2 (two) times daily.      . Multiple Vitamins-Minerals (MULTIVITAMINS THER. W/MINERALS) TABS Take 2 tablets by mouth daily.      . temazepam (RESTORIL) 7.5 MG capsule Take 7.5 mg by mouth at bedtime.      . Warfarin Sodium (COUMADIN PO) Take 3.5 mg by mouth every evening.      Marland Kitchen  acetaminophen (TYLENOL) 325 MG tablet Take 650 mg by mouth every 4 (four) hours as needed. For pain      . Alum & Mag Hydroxide-Simeth (MAALOX PLUS PO) Take 30 mLs by mouth 4 (four) times daily as needed. For indigestion.      . cloNIDine (CATAPRES) 0.1 MG tablet Take 0.1 mg by mouth every 8 (eight) hours as needed. For blood pressure SBP>OR = 180      . feeding supplement (PRO-STAT SUGAR FREE 64) LIQD Take 30 mLs by mouth 3 (three) times daily with meals.  900 mL  1  . ondansetron (ZOFRAN) 8 MG tablet Take 8 mg by mouth every 12 (twelve) hours as needed. nausea        Assessment: 60 yof on chronic coumadin therapy for history of CVA. Now s/p L BKA to resume coumadin. INR is subtherapeutic at 1.39. Pt is also anemic but platelets are WNL. No bleeding reported.   Goal of Therapy:  INR 2-3   Plan:  1. Coumadin 5mg  PO x 1 tonight 2. Daily INR  Dequarius Jeffries, Rande Lawman 01/18/2012,7:07 PM

## 2012-01-18 NOTE — H&P (Signed)
[name] is an 51 y.o. female.   Chief Complaint: Osteomyelitis gangrene left foot HPI: Patient is a 51 year old woman with history of peripheral vascular disease and diabetes who has had chronic ulceration and infection of the left foot after failure of limb salvage patient presents at this time for left transtibial amputation.  Past Medical History  Diagnosis Date  . HTN (hypertension)   . HLD (hyperlipidemia)   . History of methicillin resistant staphylococcus aureus (MRSA)   . CVA (cerebral infarction)   . Anemia   . Tachycardia   . Obesity   . Osteomyelitis   . Lower back pain     Chronic  . Idiopathic peripheral neuropathy     NOS  . DJD (degenerative joint disease)   . Headache   . GERD (gastroesophageal reflux disease)   . Depression   . Anxiety   . Hypokalemia     resolved  . Cerebral infarct     left parietal and bilateral  . Esophagitis     distal  . Fluid overload     compensated  . Urinary retention   . H/O: pneumonia   . History of bacteremia   . Surgery, other elective     of the ear  . Stroke     Hx of left frontoparietal stroke in 12/11. History of right brain stroke.  . Cardiomyopathy     Echocardiogram 04/23/11: Mild LVH, EF 30-35%, mild MR, mild LAE, mild-moderate RVE.  TEE 04/25/11: Moderate LVH, severe biventricular dysfunction, EF 20-25%, moderate to severe MR, moderate TR, biatrial enlargement, no PFO, negative bubble study  . Shortness of breath     with exertion   . Sleep apnea     no machine used  pt does not remember test for sleep apnea  . DM type 2 (diabetes mellitus, type 2)     type two   oral meds only   . 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 UTI (lower urinary tract infection) 10/19/11    had approx April 2013    Past Surgical History  Procedure Date  . Tympanoplasty   . Toe amputation     left 2nd toe  . Dilation and curettage of uterus     history  . Tee without cardioversion 04/25/2011    Procedure:  TRANSESOPHAGEAL ECHOCARDIOGRAM (TEE);  Surgeon: Jolaine Artist, MD;  Location: [facility] ENDOSCOPY;  Service: Cardiovascular;  Laterality: N/A;  . Esophagogastroduodenoscopy 08/14/2011    Procedure: ESOPHAGOGASTRODUODENOSCOPY (EGD);  Surgeon: Scarlette Shorts, MD;  Location: [facility] ENDOSCOPY;  Service: Endoscopy;  Laterality: N/A;    Family History  Problem Relation Age of Onset  . Coronary artery disease    . Diabetes    . Cancer Mother   . Diabetes Mother   . Heart disease Father   . Hyperlipidemia Father   . Hypertension Father   . Stroke Father   . Heart disease Brother   . Anesthesia problems Neg Hx   . Hypotension Neg Hx   . Malignant hyperthermia Neg Hx   . Pseudochol deficiency Neg Hx    Social History:  reports that she has never smoked. She has never used smokeless tobacco. She reports that she drinks alcohol. She reports that she does not use illicit drugs.  Allergies:  Allergies  Allergen Reactions  . Ace Inhibitors Shortness Of Breath  . Latex Hives  . Lisinopril Other (See Comments)    Per MAR  .  Vancomycin Other (See Comments)    Per MAR    No prescriptions prior to admission    No results found for this or any previous visit (from the past 48 hour(s)). No results found.  Review of Systems  All other systems reviewed and are negative.    There were no vitals taken for this visit. Physical Exam  On examination patient has abscess and osteomyelitis involving the left foot.  Assessment/Plan Assessment: Diabetic insensate neuropathy with peripheral vascular disease and abscess and osteomyelitis left foot.  Plan: We'll plan for a left transtibial amputation. Risks and benefits of surgery were discussed patient states she understands and wished to proceed at this time.  DUDA,MARCUS V 01/18/2012, 6:04 AM

## 2012-01-18 NOTE — Anesthesia Preprocedure Evaluation (Addendum)
Anesthesia Evaluation  Patient identified by MRN, date of birth, ID band Patient awake    Reviewed: Allergy & Precautions, H&P , NPO status , Patient's Chart, lab work & pertinent test results, reviewed documented beta blocker date and time   Airway Mallampati: II TM Distance: >3 FB Neck ROM: Full    Dental No notable dental hx. (+) Poor Dentition and Dental Advisory Given   Pulmonary shortness of breath, sleep apnea ,  breath sounds clear to auscultation  Pulmonary exam normal       Cardiovascular hypertension, On Home Beta Blockers and On Medications Rhythm:Regular Rate:Normal     Neuro/Psych  Headaches, PSYCHIATRIC DISORDERS  Neuromuscular disease CVA, Residual Symptoms negative neurological ROS     GI/Hepatic Neg liver ROS, GERD-  Medicated,  Endo/Other  Type 1, Insulin Dependent  Renal/GU Renal InsufficiencyRenal disease  negative genitourinary   Musculoskeletal   Abdominal (+) + obese,   Peds  Hematology negative hematology ROS (+)   Anesthesia Other Findings   Reproductive/Obstetrics negative OB ROS                         Anesthesia Physical Anesthesia Plan  ASA: IV  Anesthesia Plan: General   Post-op Pain Management:    Induction: Intravenous  Airway Management Planned: Oral ETT  Additional Equipment:   Intra-op Plan:   Post-operative Plan: Extubation in OR  Informed Consent: I have reviewed the patients History and Physical, chart, labs and discussed the procedure including the risks, benefits and alternatives for the proposed anesthesia with the patient or authorized representative who has indicated his/her understanding and acceptance.   Dental advisory given  Plan Discussed with: CRNA  Anesthesia Plan Comments:         Anesthesia Quick Evaluation

## 2012-01-18 NOTE — Anesthesia Postprocedure Evaluation (Signed)
  Anesthesia Post-op Note  Patient: [name]  Procedure(s) Performed: Procedure(s) (LRB): AMPUTATION BELOW KNEE (Left)  Patient Location: PACU  Anesthesia Type: General  Level of Consciousness: awake and alert   Airway and Oxygen Therapy: Patient Spontanous Breathing  Post-op Pain: mild  Post-op Assessment: Post-op Vital signs reviewed, Patient's Cardiovascular Status Stable, Respiratory Function Stable, Patent Airway, No signs of Nausea or vomiting and Pain level controlled  Post-op Vital Signs: stable  Complications: No apparent anesthesia complications

## 2012-01-19 LAB — GLUCOSE, CAPILLARY
Glucose-Capillary: 130 mg/dL — ABNORMAL HIGH (ref 70–99)
Glucose-Capillary: 111 mg/dL — ABNORMAL HIGH (ref 70–99)
Glucose-Capillary: 117 mg/dL — ABNORMAL HIGH (ref 70–99)

## 2012-01-19 MED ORDER — WARFARIN SODIUM 5 MG PO TABS
5.0000 mg | ORAL_TABLET | Freq: Once | ORAL | Status: AC
  Administered 2012-01-19: 5 mg via ORAL
  Filled 2012-01-19: qty 1

## 2012-01-19 MED ORDER — SULFAMETHOXAZOLE-TMP DS 800-160 MG PO TABS
1.0000 | ORAL_TABLET | Freq: Two times a day (BID) | ORAL | Status: DC
  Administered 2012-01-19 – 2012-01-20 (×3): 1 via ORAL
  Filled 2012-01-19 (×5): qty 1

## 2012-01-19 NOTE — Evaluation (Signed)
Physical Therapy Evaluation Patient Details Name: [name] MRN: [id number] DOB: 19-Sep-1960 Today's Date: 01/19/2012 Time: [id number] PT Time Calculation (min): 24 min  PT Assessment / Plan / Recommendation Clinical Impression  Patient is a 51 yo female now s/p lt. BKA.  Patient with history of bil. CVA's with significant lt. hemiparesis.  Patient also reports diplopia.  Patient was receiving therapy at [facility].  Recommend patient return to SNF at discharge for continued therapy.  Will follow patient acutely for strengthening and functional mobility.    PT Assessment  Patient needs continued PT services    Follow Up Recommendations  Skilled nursing facility    Barriers to Discharge        Equipment Recommendations  Defer to next venue    Recommendations for Other Services     Frequency Min 2X/week    Precautions / Restrictions Precautions Precautions: Fall Restrictions Weight Bearing Restrictions: No   Pertinent Vitals/Pain Patient reports phantom pain - impacting mobility.      Mobility  Bed Mobility Bed Mobility: Supine to Sit;Sit to Supine;Sitting - Scoot to Edge of Bed;Scooting to HOB Supine to Sit: 2: Max assist;HOB flat;With rails Sitting - Scoot to Edge of Bed: 3: Mod assist Sit to Supine: 1: +1 Total assist;With rail;HOB flat Scooting to HOB: 1: +2 Total assist Scooting to [facility]: Patient Percentage: 10% Details for Bed Mobility Assistance: Verbal cues to use rail with RUE to assist with transition.  Used bed pad to assist patient with moving to sit. Transfers Transfers: Not assessed    Exercises     PT Diagnosis: Difficulty walking;Generalized weakness;Acute pain;Hemiplegia non-dominant side;Altered mental status  PT Problem List: Decreased strength;Decreased range of motion;Decreased activity tolerance;Decreased balance;Decreased mobility;Decreased cognition;Impaired tone;Obesity;Pain PT Treatment Interventions: Functional mobility  training;Therapeutic exercise;Therapeutic activities;Balance training;Cognitive remediation;Patient/family education   PT Goals Acute Rehab PT Goals PT Goal Formulation: With patient Time For Goal Achievement: 02/02/12 Potential to Achieve Goals: Good Pt will Roll Supine to Right Side: with mod assist;with rail PT Goal: Rolling Supine to Right Side - Progress: Goal set today Pt will Roll Supine to Left Side: with min assist;with rail PT Goal: Rolling Supine to Left Side - Progress: Goal set today Pt will go Supine/Side to Sit: with min assist;with HOB 0 degrees;with rail PT Goal: Supine/Side to Sit - Progress: Goal set today Pt will Sit at Edge of Bed: with supervision;6-10 min;with no upper extremity support PT Goal: Sit at Edge Of Bed - Progress: Goal set today Pt will Transfer Bed to Chair/Chair to Bed: with mod assist PT Transfer Goal: Bed to Chair/Chair to Bed - Progress: Goal set today  Visit Information  Last PT Received On: 01/19/12 Assistance Needed: +2    Subjective Data  Subjective: "I had a stroke" Patient Stated Goal: None stated   Prior Functioning  Home Living Available Help at Discharge: [place] Prior Function Level of Independence: Needs assistance Needs Assistance: Transfers;Gait;Dressing;Bathing Able to Take Stairs?: No Driving: No Communication Communication: No difficulties    Cognition  Overall Cognitive Status: History of cognitive impairments - at baseline Arousal/Alertness: Awake/alert Orientation Level: Disoriented to;Time;Situation Behavior During Session: [facility] for tasks performed    Extremity/Trunk Assessment Right Upper Extremity Assessment RUE ROM/Strength/Tone: WFL for tasks assessed RUE Sensation: WFL - Light Touch Left Upper Extremity Assessment LUE ROM/Strength/Tone: Deficits LUE ROM/Strength/Tone Deficits: No movement and increased tone from prior CVA Right Lower Extremity Assessment RLE ROM/Strength/Tone:  Deficits RLE ROM/Strength/Tone Deficits: Decreased strength 3+/5 RLE Sensation: WFL -  Light Touch Left Lower Extremity Assessment LLE ROM/Strength/Tone: Deficits;Unable to fully assess;Due to pain LLE ROM/Strength/Tone Deficits: Knee extension 2/5 strength and decreased ROM   Balance Balance Balance Assessed: Yes Static Sitting Balance Static Sitting - Balance Support: Right upper extremity supported;Feet supported (RLE supported) Static Sitting - Level of Assistance: 5: Stand by assistance Static Sitting - Comment/# of Minutes: Patient able to maintain upright sitting balance x 5 minutes.  Trunk flexed.  Worked on holding head upright and trunk extension  End of Session PT - End of Session Activity Tolerance: Patient limited by fatigue;Patient limited by pain Patient left: in bed;with call bell/phone within reach Nurse Communication: Mobility status;Need for lift equipment  GP     Despina Pole 01/19/2012, 9:29 AM Sherry Pian. [name], [place] Pager [phone]

## 2012-01-19 NOTE — Progress Notes (Signed)
Subjective: 1 Day Post-Op Procedure(s) (LRB): AMPUTATION BELOW KNEE (Left) Patient reports pain as moderate.    Objective: Vital signs in last 24 hours: Temp:  [97.4 F (36.3 C)-98.7 F (37.1 C)] 98.7 F (37.1 C) (08/10 0547) Pulse Rate:  [73-86] 86  (08/10 0547) Resp:  [6-18] 18  (08/10 0841) BP: (122-163)/(75-97) 140/88 mmHg (08/10 0547) SpO2:  [93 %-100 %] 99 % (08/10 0841) Weight:  [98.431 kg (217 lb)] 98.431 kg (217 lb) (08/09 1208)  Intake/Output from previous day: 08/09 0701 - 08/10 0700 In: 1000 [P.O.:360; I.V.:640] Out: 600 [Urine:600] Intake/Output this shift:     Basename 01/18/12 1156  HGB 10.2*    Basename 01/18/12 1156  WBC 6.6  RBC 3.53*  HCT 31.8*  PLT 408*    Basename 01/18/12 1156  NA 138  K 4.9  CL 107  CO2 24  BUN 29*  CREATININE 1.28*  GLUCOSE 104*  CALCIUM 9.4    Basename 01/19/12 0608 01/18/12 1156  LABPT -- --  INR 1.42 1.39    BKA dressing dry and intact  Assessment/Plan: 1 Day Post-Op Procedure(s) (LRB): AMPUTATION BELOW KNEE (Left) Up with therapy  YATES,MARK C 01/19/2012, 9:48 AM

## 2012-01-19 NOTE — Progress Notes (Signed)
CSW attempted to speak with patient to discuss d/cplans- she was quite drowsy and unable to participate in the assessment at this time. Per chart review, patient admitted from [facility] and anticipate return at d/c. Will proceed with FL2 completion/update and further planning for return to SNF at d/c- full assessment to follow-  Eduard Clos, MSW, [place] Weekend coverage [phone]

## 2012-01-19 NOTE — Progress Notes (Signed)
   CARE MANAGEMENT NOTE 01/19/2012  Patient:  [name], [name]   Account Number:  [id number]  Date Initiated:  01/19/2012  Documentation initiated by:  Verne Spurr Assessment:   51 yr old from [facility] s/p pod BKA     Action/Plan:   return to SNF   Anticipated DC Date:  01/21/2012   Anticipated DC Plan:  SKILLED NURSING FACILITY  In-house referral  Clinical Social Worker      DC Planning Services  CM consult      Choice offered to / List presented to:             Status of service:  Completed, signed off Medicare Important Message given?   (If response is "NO", the following Medicare IM given date fields will be blank) Date Medicare IM given:   Date Additional Medicare IM given:    Discharge Disposition:  [place]  Per UR Regulation:    If discussed at Long Length of Stay Meetings, dates discussed:    Comments:  01/19/12 950am GHoward,RN,BC,BSN Received CM consult for [facility] needs, equipment, medication. Per Chart patient from [facility] and Per PT recommendations to return to SNF. Referral to CSW.

## 2012-01-19 NOTE — Progress Notes (Signed)
ANTICOAGULATION CONSULT NOTE - Initial Consult  Pharmacy Consult for warfarin Indication: Hx CVA + VTE prophylaxis s/p L BKA  Allergies  Allergen Reactions  . Ace Inhibitors Shortness Of Breath  . Latex Hives  . Lisinopril Other (See Comments)    Per MAR  . Vancomycin Other (See Comments)    Per [facility]    Patient Measurements: Height: 5\' 7"  (170.2 cm) Weight: 217 lb (98.431 kg) IBW/kg (Calculated) : 61.6   Vital Signs: Temp: 98.7 F (37.1 C) (08/10 0547) BP: 140/88 mmHg (08/10 0547) Pulse Rate: 86  (08/10 0547)  Labs:  Basename 01/19/12 0608 01/18/12 1156  HGB -- 10.2*  HCT -- 31.8*  PLT -- 408*  APTT -- 50*  LABPROT 17.6* 17.3*  INR 1.42 1.39  HEPARINUNFRC -- --  CREATININE -- 1.28*  CKTOTAL -- --  CKMB -- --  TROPONINI -- --    Estimated Creatinine Clearance: 64 ml/min (by C-G formula based on Cr of 1.28).   Medical History: Past Medical History  Diagnosis Date  . HTN (hypertension)   . HLD (hyperlipidemia)   . History of methicillin resistant staphylococcus aureus (MRSA)   . CVA (cerebral infarction)   . Anemia   . Tachycardia   . Obesity   . Osteomyelitis   . Lower back pain     Chronic  . Idiopathic peripheral neuropathy     NOS  . DJD (degenerative joint disease)   . Headache   . GERD (gastroesophageal reflux disease)   . Depression   . Anxiety   . Hypokalemia     resolved  . Cerebral infarct     left parietal and bilateral  . Esophagitis     distal  . Fluid overload     compensated  . Urinary retention   . H/O: pneumonia   . History of bacteremia   . Surgery, other elective     of the ear  . Stroke     Hx of left frontoparietal stroke in 12/11. History of right brain stroke.  . Cardiomyopathy     Echocardiogram 04/23/11: Mild LVH, EF 30-35%, mild MR, mild LAE, mild-moderate RVE.  TEE 04/25/11: Moderate LVH, severe biventricular dysfunction, EF 20-25%, moderate to severe MR, moderate TR, biatrial enlargement, no PFO, negative bubble  study  . Shortness of breath     with exertion   . Sleep apnea     no machine used  pt does not remember test for sleep apnea  . DM type 2 (diabetes mellitus, type 2)     type two   oral meds only   . 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 UTI (lower urinary tract infection) 10/19/11    had approx April 2013    Medications:  Prescriptions prior to admission  Medication Sig Dispense Refill  . ALPRAZolam (XANAX) 0.25 MG tablet Take 0.25 mg by mouth 3 (three) times daily as needed. For anxiety      . amLODipine (NORVASC) 5 MG tablet Take 5 mg by mouth daily.      Marland Kitchen antiseptic oral rinse (BIOTENE) LIQD 15 mLs by Mouth Rinse route 2 (two) times daily.      Marland Kitchen atorvastatin (LIPITOR) 20 MG tablet Take 20 mg by mouth at bedtime.      . citalopram (CELEXA) 10 MG tablet Take 10 mg by mouth daily.       . cycloSPORINE (RESTASIS) 0.05 % ophthalmic emulsion Place 1 drop into both  eyes 2 (two) times daily.       . ferrous sulfate 325 (65 FE) MG tablet Take 325 mg by mouth 2 (two) times daily.       Marland Kitchen gabapentin (NEURONTIN) 300 MG capsule Take 600 mg by mouth 4 (four) times daily.      Marland Kitchen HYDROcodone-acetaminophen (NORCO) 5-325 MG per tablet Take 1 tablet by mouth every 4 (four) hours as needed. For pain      . insulin aspart (NOVOLOG) 100 UNIT/ML injection Inject 5 Units into the skin 4 (four) times daily -  before meals and at bedtime. 5 units for blood sugar>150      . methocarbamol (ROBAXIN) 500 MG tablet Take 500 mg by mouth every 6 (six) hours as needed. For muscle spasms.      . methylphenidate (RITALIN) 10 MG tablet Take 10 mg by mouth 2 (two) times daily.       . metoprolol (LOPRESSOR) 100 MG tablet Take 100 mg by mouth 2 (two) times daily.      . Multiple Vitamins-Minerals (MULTIVITAMINS THER. W/MINERALS) TABS Take 2 tablets by mouth daily.      . temazepam (RESTORIL) 7.5 MG capsule Take 7.5 mg by mouth at bedtime.      . Warfarin Sodium (COUMADIN PO) Take 3.5 mg by mouth every  evening.      Marland Kitchen acetaminophen (TYLENOL) 325 MG tablet Take 650 mg by mouth every 4 (four) hours as needed. For pain      . Alum & Mag Hydroxide-Simeth (MAALOX PLUS PO) Take 30 mLs by mouth 4 (four) times daily as needed. For indigestion.      . cloNIDine (CATAPRES) 0.1 MG tablet Take 0.1 mg by mouth every 8 (eight) hours as needed. For blood pressure SBP>OR = 180      . feeding supplement (PRO-STAT SUGAR FREE 64) LIQD Take 30 mLs by mouth 3 (three) times daily with meals.  900 mL  1  . ondansetron (ZOFRAN) 8 MG tablet Take 8 mg by mouth every 12 (twelve) hours as needed. nausea        Assessment: 89 yof on chronic coumadin therapy for history of CVA. Now s/p L BKA to resume coumadin. INR is still subtherapeutic at 1.42. No bleeding reported.   Goal of Therapy:  INR 2-3   Plan:  1. Coumadin 5mg  PO x 1 tonight 2. Daily INR  Gerardo Territo 01/19/2012,10:17 AM

## 2012-01-20 LAB — GLUCOSE, CAPILLARY: Glucose-Capillary: 134 mg/dL — ABNORMAL HIGH (ref 70–99)

## 2012-01-20 LAB — PROTIME-INR: INR: 1.66 — ABNORMAL HIGH (ref 0.00–1.49)

## 2012-01-20 LAB — URINE CULTURE: Colony Count: NO GROWTH

## 2012-01-20 MED ORDER — WARFARIN SODIUM 2.5 MG PO TABS
2.5000 mg | ORAL_TABLET | Freq: Once | ORAL | Status: AC
  Administered 2012-01-20: 2.5 mg via ORAL
  Filled 2012-01-20: qty 1

## 2012-01-20 NOTE — Progress Notes (Signed)
ANTICOAGULATION CONSULT NOTE - Initial Consult  Pharmacy Consult for warfarin Indication: Hx CVA + VTE prophylaxis s/p L BKA  Allergies  Allergen Reactions  . Ace Inhibitors Shortness Of Breath  . Latex Hives  . Lisinopril Other (See Comments)    Per MAR  . Vancomycin Other (See Comments)    Per [facility]    Patient Measurements: Height: 5\' 7"  (170.2 cm) Weight: 217 lb (98.431 kg) IBW/kg (Calculated) : 61.6   Vital Signs: Temp: 98.7 F (37.1 C) (08/11 0609) BP: 104/50 mmHg (08/11 0609) Pulse Rate: 72  (08/11 0609)  Labs:  Basename 01/20/12 0706 01/19/12 0608 01/18/12 1156  HGB -- -- 10.2*  HCT -- -- 31.8*  PLT -- -- 408*  APTT -- -- 50*  LABPROT 19.9* 17.6* 17.3*  INR 1.66* 1.42 1.39  HEPARINUNFRC -- -- --  CREATININE -- -- 1.28*  CKTOTAL -- -- --  CKMB -- -- --  TROPONINI -- -- --    Estimated Creatinine Clearance: 64 ml/min (by C-G formula based on Cr of 1.28).   Medical History: Past Medical History  Diagnosis Date  . HTN (hypertension)   . HLD (hyperlipidemia)   . History of methicillin resistant staphylococcus aureus (MRSA)   . CVA (cerebral infarction)   . Anemia   . Tachycardia   . Obesity   . Osteomyelitis   . Lower back pain     Chronic  . Idiopathic peripheral neuropathy     NOS  . DJD (degenerative joint disease)   . Headache   . GERD (gastroesophageal reflux disease)   . Depression   . Anxiety   . Hypokalemia     resolved  . Cerebral infarct     left parietal and bilateral  . Esophagitis     distal  . Fluid overload     compensated  . Urinary retention   . H/O: pneumonia   . History of bacteremia   . Surgery, other elective     of the ear  . Stroke     Hx of left frontoparietal stroke in 12/11. History of right brain stroke.  . Cardiomyopathy     Echocardiogram 04/23/11: Mild LVH, EF 30-35%, mild MR, mild LAE, mild-moderate RVE.  TEE 04/25/11: Moderate LVH, severe biventricular dysfunction, EF 20-25%, moderate to severe MR,  moderate TR, biatrial enlargement, no PFO, negative bubble study  . Shortness of breath     with exertion   . Sleep apnea     no machine used  pt does not remember test for sleep apnea  . DM type 2 (diabetes mellitus, type 2)     type two   oral meds only   . 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 UTI (lower urinary tract infection) 10/19/11    had approx April 2013    Medications:  Prescriptions prior to admission  Medication Sig Dispense Refill  . ALPRAZolam (XANAX) 0.25 MG tablet Take 0.25 mg by mouth 3 (three) times daily as needed. For anxiety      . amLODipine (NORVASC) 5 MG tablet Take 5 mg by mouth daily.      Marland Kitchen antiseptic oral rinse (BIOTENE) LIQD 15 mLs by Mouth Rinse route 2 (two) times daily.      Marland Kitchen atorvastatin (LIPITOR) 20 MG tablet Take 20 mg by mouth at bedtime.      . citalopram (CELEXA) 10 MG tablet Take 10 mg by mouth daily.       Marland Kitchen  cycloSPORINE (RESTASIS) 0.05 % ophthalmic emulsion Place 1 drop into both eyes 2 (two) times daily.       . ferrous sulfate 325 (65 FE) MG tablet Take 325 mg by mouth 2 (two) times daily.       Marland Kitchen gabapentin (NEURONTIN) 300 MG capsule Take 600 mg by mouth 4 (four) times daily.      Marland Kitchen HYDROcodone-acetaminophen (NORCO) 5-325 MG per tablet Take 1 tablet by mouth every 4 (four) hours as needed. For pain      . insulin aspart (NOVOLOG) 100 UNIT/ML injection Inject 5 Units into the skin 4 (four) times daily -  before meals and at bedtime. 5 units for blood sugar>150      . methocarbamol (ROBAXIN) 500 MG tablet Take 500 mg by mouth every 6 (six) hours as needed. For muscle spasms.      . methylphenidate (RITALIN) 10 MG tablet Take 10 mg by mouth 2 (two) times daily.       . metoprolol (LOPRESSOR) 100 MG tablet Take 100 mg by mouth 2 (two) times daily.      . Multiple Vitamins-Minerals (MULTIVITAMINS THER. W/MINERALS) TABS Take 2 tablets by mouth daily.      . temazepam (RESTORIL) 7.5 MG capsule Take 7.5 mg by mouth at bedtime.      .  Warfarin Sodium (COUMADIN PO) Take 3.5 mg by mouth every evening.      Marland Kitchen acetaminophen (TYLENOL) 325 MG tablet Take 650 mg by mouth every 4 (four) hours as needed. For pain      . Alum & Mag Hydroxide-Simeth (MAALOX PLUS PO) Take 30 mLs by mouth 4 (four) times daily as needed. For indigestion.      . cloNIDine (CATAPRES) 0.1 MG tablet Take 0.1 mg by mouth every 8 (eight) hours as needed. For blood pressure SBP>OR = 180      . feeding supplement (PRO-STAT SUGAR FREE 64) LIQD Take 30 mLs by mouth 3 (three) times daily with meals.  900 mL  1  . ondansetron (ZOFRAN) 8 MG tablet Take 8 mg by mouth every 12 (twelve) hours as needed. nausea        Assessment: 48 yof on chronic coumadin therapy for history of CVA. Now s/p L BKA to resume coumadin. INR is still subtherapeutic but increased from 1.42 to 1.66 today. Patient was started on Bactrim last night which may increase Coumadin levels. Hgb 10.2 as of 8/9.   Goal of Therapy:  INR 2-3   Plan:  1. Coumadin 2.5mg  PO x 1 tonight 2. Daily INR  [name] 01/20/2012,11:58 AM

## 2012-01-20 NOTE — Progress Notes (Signed)
Subjective: 2 Days Post-Op Procedure(s) (LRB): AMPUTATION BELOW KNEE (Left) Patient reports pain as moderate.    Objective: Vital signs in last 24 hours: Temp:  [98.7 F (37.1 C)-99.1 F (37.3 C)] 98.7 F (37.1 C) (08/11 0609) Pulse Rate:  [72-94] 72  (08/11 0609) Resp:  [16-18] 18  (08/11 0609) BP: (104-139)/(50-72) 104/50 mmHg (08/11 0609) SpO2:  [98 %-100 %] 100 % (08/11 0609)  Intake/Output from previous day: 08/10 0701 - 08/11 0700 In: 1560 [P.O.:1560] Out: 1000 [Urine:1000] Intake/Output this shift:     Basename 01/18/12 1156  HGB 10.2*    Basename 01/18/12 1156  WBC 6.6  RBC 3.53*  HCT 31.8*  PLT 408*    Basename 01/18/12 1156  NA 138  K 4.9  CL 107  CO2 24  BUN 29*  CREATININE 1.28*  GLUCOSE 104*  CALCIUM 9.4    Basename 01/20/12 0706 01/19/12 0608  LABPT -- --  INR 1.66* 1.42    dressing dry.   Assessment/Plan: 2 Days Post-Op Procedure(s) (LRB): AMPUTATION BELOW KNEE (Left) Up with therapy  OOB  [name] C 01/20/2012, 4:08 PM

## 2012-01-21 ENCOUNTER — Encounter (HOSPITAL_COMMUNITY): Payer: Self-pay | Admitting: Orthopedic Surgery

## 2012-01-21 LAB — PROTIME-INR: Prothrombin Time: 20.8 seconds — ABNORMAL HIGH (ref 11.6–15.2)

## 2012-01-21 LAB — GLUCOSE, CAPILLARY: Glucose-Capillary: 75 mg/dL (ref 70–99)

## 2012-01-21 MED ORDER — HYDROCODONE-ACETAMINOPHEN 5-500 MG PO TABS: 1.0000 | ORAL_TABLET | Freq: Four times a day (QID) | ORAL | Status: AC | PRN

## 2012-01-21 MED ORDER — WARFARIN SODIUM 2.5 MG PO TABS
2.5000 mg | ORAL_TABLET | Freq: Once | ORAL | Status: DC
  Filled 2012-01-21: qty 1

## 2012-01-21 NOTE — Clinical Social Work Placement (Signed)
     Clinical Social Work Department CLINICAL SOCIAL WORK PLACEMENT NOTE 01/21/2012  Patient:  [name], [name]  Account Number:  [id number] Admit date:  01/18/2012  Clinical Social Worker:  Butch Penny Siddh Vandeventer, BSW  Date/time:  01/21/2012 04:28 PM  Clinical Social Work is seeking post-discharge placement for this patient at the following level of care:   SKILLED NURSING   (*CSW will update this form in Epic as items are completed)   01/21/2012  Patient/family provided with [place] Department of Clinical Social Works list of facilities offering this level of care within the geographic area requested by the patient (or if unable, by the patients family).  01/21/2012  Patient/family informed of their freedom to choose among providers that offer the needed level of care, that participate in Medicare, Medicaid or managed care program needed by the patient, have an available bed and are willing to accept the patient.  01/21/2012  Patient/family informed of [facility] ownership interest in [facility], as well as of the fact that they are under no obligation to receive care at this facility.  PASARR submitted to [employer] on  PASARR number received from [place] on   FL2 transmitted to all facilities in geographic area requested by pt/family on  01/21/2012 FL2 transmitted to all facilities within larger geographic area on   Patient informed that his/her managed care company has contracts with or will negotiate with  certain facilities, including the following:   Has existing PASARR     Patient/family informed of bed offers received:  01/21/2012 Patient chooses bed at [facility] Physician recommends and patient chooses bed at    Patient to be transferred to [place] on  01/21/2012 Patient to be transferred to facility by Ambulance  The following physician request were entered in Epic:   Additional Comments: Patient and sister are pleased with  d/c plan. Notified SNF and patient's nurse of d/c plan.

## 2012-01-21 NOTE — Progress Notes (Signed)
ANTICOAGULATION CONSULT NOTE - Follow Up Consult  Pharmacy Consult for Coumadin Indication: Hx stroke  Allergies  Allergen Reactions  . Ace Inhibitors Shortness Of Breath  . Latex Hives  . Lisinopril Other (See Comments)    Per MAR  . Vancomycin Other (See Comments)    Per [facility]    Patient Measurements: Height: 5\' 7"  (170.2 cm) Weight: 217 lb (98.431 kg) IBW/kg (Calculated) : 61.6  Heparin Dosing Weight:   Vital Signs: Temp: 98.4 F (36.9 C) (08/12 0619) Temp src: Oral (08/12 0619) BP: 131/75 mmHg (08/12 0619) Pulse Rate: 80  (08/12 0619)  Labs:  Basename 01/21/12 0400 01/20/12 0706 01/19/12 0608 01/18/12 1156  HGB -- -- -- 10.2*  HCT -- -- -- 31.8*  PLT -- -- -- 408*  APTT -- -- -- 50*  LABPROT 20.8* 19.9* 17.6* --  INR 1.76* 1.66* 1.42 --  HEPARINUNFRC -- -- -- --  CREATININE -- -- -- 1.28*  CKTOTAL -- -- -- --  CKMB -- -- -- --  TROPONINI -- -- -- --    Estimated Creatinine Clearance: 64 ml/min (by C-G formula based on Cr of 1.28).   Medications:  Scheduled:    . amLODipine  5 mg Oral Daily  . antiseptic oral rinse  15 mL Mouth Rinse BID  . atorvastatin  20 mg Oral QHS  . citalopram  10 mg Oral Daily  . cycloSPORINE  1 drop Both Eyes BID  . feeding supplement  30 mL Oral TID WC  . ferrous sulfate  325 mg Oral BID  . gabapentin  600 mg Oral QID  . insulin aspart  0-15 Units Subcutaneous TID WC  . insulin aspart  4 Units Subcutaneous TID WC  . insulin glargine  20 Units Subcutaneous QHS  . methylphenidate  10 mg Oral BID  . metoprolol  100 mg Oral BID  . temazepam  7.5 mg Oral QHS  . warfarin  2.5 mg Oral ONCE-1800  . Warfarin - Pharmacist Dosing Inpatient   Does not apply q1800  . DISCONTD: sulfamethoxazole-trimethoprim  1 tablet Oral Q12H    Assessment: 51yo female with hx stroke on Coumadin 3.5mg  daily pta, now s/p L-BKA.  INR up to 1.76 this AM, on Bactrim as well for UTI which may increase Coumadin effect.  No bleeding problems noted.  Urine  cx is (-)  Goal of Therapy:  INR 2-3 Monitor platelets by anticoagulation protocol: Yes   Plan:  1.  Coumadin 2.5mg  today 2.  Paged Dr Sharol Given- OK to d/c Bactrim as culture is negative 3.  Communicated order to d/c foley to RN caring for patient 4.  F/U in AM  Gracy Bruins, PharmD [facility]

## 2012-01-21 NOTE — Progress Notes (Signed)
OT Screen  Pt from SNF and planning to return to SNF. Will defer OT to SNF. If OT eval is needed, please reorder.  [facility], OTR/L  [phone] 01/21/2012

## 2012-01-21 NOTE — Clinical Social Work Psychosocial (Signed)
     Clinical Social Work Department BRIEF PSYCHOSOCIAL ASSESSMENT 01/21/2012  Patient:  [name], [name]     Account Number:  [id number]     Admit date:  01/18/2012  Clinical Social Worker:  Caryl Comes  Date/Time:  01/21/2012 10:00 AM  Referred by:  Physician  Date Referred:  01/20/2012 Referred for  SNF Placement   Other Referral:   From [facility]- does not wat to return there   Interview type:  Other - See comment Other interview type:   Patient and sister  Rosalind    PSYCHOSOCIAL DATA Living Status:  FACILITY Admitted from facility:  [place] of care:  [place] Primary support name:  Mackie Pai  sister  [phone] Primary support relationship to patient:  SIBLING Degree of support available:   Strong support. Sister is not HCPOA but is only family for patient.    CURRENT CONCERNS Current Concerns  Post-Acute Placement   Other Concerns:   Does not want to return to [facility].  Spoke to [employer] at [facility]- stated that patient had been given a 30 day notice and that they would not accept her back. Patient has past history of non-payment of Medicaid co-pay amts.    SOCIAL WORK ASSESSMENT / PLAN Resident of [place]. As stated above- patient and sister are requesting a new placement- wanting to go to facility in [facility].  Discussed need for bed search as patient is Medicaid only; patient and sister agreed.  SNF list provided and bed search was initiated. Per MD- patient is medically stable for d/c.   Assessment/plan status:  Psychosocial Support/Ongoing Assessment of Needs Other assessment/ plan:   Information/referral to community resources:   SNF bed list    PATIENTS/FAMILYS RESPONSE TO PLAN OF CARE: Patient and sister are agreable to bed search and are encouraged by possible available bed in [facility]. Patient is alert and very pleasant.

## 2012-01-21 NOTE — Discharge Summary (Signed)
Physician Discharge Summary  Patient ID: [name] MRN: [id number] DOB/AGE: November 17, 1960 51 y.o.  Admit date: 01/18/2012 Discharge date: 01/21/2012  Admission Diagnoses: Gangrene left foot  Discharge Diagnoses: Same Active Problems:  * No active hospital problems. *    Discharged Condition: stable  Hospital Course: Patient's hospital course was essentially unremarkable. She underwent a left transtibial amputation. Postoperatively patient was stable we will plan for discharge back to skilled nursing facility. Patient did have a urinary tract infection and this was treated with Bactrim.  Consults: None  Significant Diagnostic Studies: labs: Routine labs  Treatments: surgery: Please see operative note  Discharge Exam: Blood pressure 131/75, pulse 80, temperature 98.4 F (36.9 C), temperature source Oral, resp. rate 18, height 5\' 7"  (1.702 m), weight 98.431 kg (217 lb), SpO2 99.00%. Incision/Wound: dressing clean and dry and intact at time of discharge  Disposition: 03-Skilled Nursing Facility   Medication List  As of 01/21/2012  6:40 AM   ASK your doctor about these medications         acetaminophen 325 MG tablet   Commonly known as: TYLENOL   Take 650 mg by mouth every 4 (four) hours as needed. For pain      ALPRAZolam 0.25 MG tablet   Commonly known as: XANAX   Take 0.25 mg by mouth 3 (three) times daily as needed. For anxiety      amLODipine 5 MG tablet   Commonly known as: NORVASC   Take 5 mg by mouth daily.      antiseptic oral rinse Liqd   15 mLs by Mouth Rinse route 2 (two) times daily.      atorvastatin 20 MG tablet   Commonly known as: LIPITOR   Take 20 mg by mouth at bedtime.      citalopram 10 MG tablet   Commonly known as: CELEXA   Take 10 mg by mouth daily.      cloNIDine 0.1 MG tablet   Commonly known as: CATAPRES   Take 0.1 mg by mouth every 8 (eight) hours as needed. For blood pressure SBP>OR = 180      COUMADIN PO   Take 3.5 mg by mouth  every evening.      cycloSPORINE 0.05 % ophthalmic emulsion   Commonly known as: RESTASIS   Place 1 drop into both eyes 2 (two) times daily.      feeding supplement Liqd   Take 30 mLs by mouth 3 (three) times daily with meals.      ferrous sulfate 325 (65 FE) MG tablet   Take 325 mg by mouth 2 (two) times daily.      gabapentin 300 MG capsule   Commonly known as: NEURONTIN   Take 600 mg by mouth 4 (four) times daily.      HYDROcodone-acetaminophen 5-325 MG per tablet   Commonly known as: NORCO/VICODIN   Take 1 tablet by mouth every 4 (four) hours as needed. For pain      insulin aspart 100 UNIT/ML injection   Commonly known as: novoLOG   Inject 5 Units into the skin 4 (four) times daily -  before meals and at bedtime. 5 units for blood sugar>150      MAALOX PLUS PO   Take 30 mLs by mouth 4 (four) times daily as needed. For indigestion.      methocarbamol 500 MG tablet   Commonly known as: ROBAXIN   Take 500 mg by mouth every 6 (six) hours as needed. For muscle spasms.  methylphenidate 10 MG tablet   Commonly known as: RITALIN   Take 10 mg by mouth 2 (two) times daily.      metoprolol 100 MG tablet   Commonly known as: LOPRESSOR   Take 100 mg by mouth 2 (two) times daily.      multivitamins ther. w/minerals Tabs   Take 2 tablets by mouth daily.      ondansetron 8 MG tablet   Commonly known as: ZOFRAN   Take 8 mg by mouth every 12 (twelve) hours as needed. nausea      temazepam 7.5 MG capsule   Commonly known as: RESTORIL   Take 7.5 mg by mouth at bedtime.           Follow-up Information    Follow up with Newt Minion, MD.   Contact information:   [place] [phone]          Signed: Newt Minion 01/21/2012, 6:40 AM

## 2012-01-21 NOTE — Progress Notes (Signed)
Physical Therapy Treatment Patient Details Name: [name] MRN: [id number] DOB: 10-11-1960 Today's Date: 01/21/2012 Time: [id number] PT Time Calculation (min): 25 min  PT Assessment / Plan / Recommendation Comments on Treatment Session  Pt is pleasent and cooperative.  Patient R residual lim was positioned improperly with pillow under the need creating flexion moment.  Advised of importance of pillow placement onder residual portion.  performed ther ex with patient to maximize knee extension.    Follow Up Recommendations  Skilled nursing facility    Barriers to Discharge        Equipment Recommendations  Defer to next venue    Recommendations for Other Services    Frequency Min 2X/week   Plan Discharge plan remains appropriate    Precautions / Restrictions Precautions Precautions: Fall Restrictions Weight Bearing Restrictions: No   Pertinent Vitals/Pain 8/10    Mobility  Bed Mobility Bed Mobility: Supine to Sit;Sit to Supine;Sitting - Scoot to Edge of Bed;Scooting to HOB Supine to Sit: 2: Max assist;HOB flat;With rails Sitting - Scoot to Edge of Bed: 3: Mod assist Sit to Supine: 1: +1 Total assist;With rail;HOB flat Scooting to HOB: 1: +2 Total assist Scooting to [facility]: Patient Percentage: 10% Transfers Transfers: Not assessed Ambulation/Gait Ambulation/Gait Assistance: Not tested (comment)    Exercises General Exercises - Lower Extremity Ankle Circles/Pumps: AROM;10 reps Quad Sets: Strengthening;AROM;10 reps;Left;Supine Short Arc Quad: AAROM;Strengthening;10 reps;Left Long Arc Quad: AROM;AAROM;Strengthening;10 reps;Seated (EOB)    PT Goals Acute Rehab PT Goals PT Goal Formulation: With patient Time For Goal Achievement: 02/02/12 Potential to Achieve Goals: Good Pt will Roll Supine to Right Side: with mod assist;with rail PT Goal: Rolling Supine to Right Side - Progress: Progressing toward goal Pt will Roll Supine to Left Side: with min assist;with rail PT  Goal: Rolling Supine to Left Side - Progress: Progressing toward goal Pt will go Supine/Side to Sit: with min assist;with HOB 0 degrees;with rail PT Goal: Supine/Side to Sit - Progress: Progressing toward goal Pt will Sit at Edge of Bed: with supervision;6-10 min;with no upper extremity support PT Goal: Sit at Edge Of Bed - Progress: Progressing toward goal  Visit Information  Last PT Received On: 01/21/12 Assistance Needed: +2    Subjective Data  Subjective: "i was using a walker at my nursing place" Patient Stated Goal: None stated   Cognition  Overall Cognitive Status: History of cognitive impairments - at baseline Arousal/Alertness: Awake/alert Orientation Level: Disoriented to;Time;Situation Behavior During Session: [facility] for tasks performed    Balance  Static Sitting Balance Static Sitting - Balance Support: Right upper extremity supported;Feet supported Static Sitting - Level of Assistance: 5: Stand by assistance Static Sitting - Comment/# of Minutes: Assist req to maintain sitting balance  End of Session PT - End of Session Activity Tolerance: Patient tolerated treatment well Patient left: in bed;with call bell/phone within reach Nurse Communication: Mobility status   GP     Duncan Dull 01/21/2012, 12:08 PM Alben Deeds, [place] DPT  [phone]

## 2012-01-21 NOTE — Progress Notes (Signed)
UR COMPLETED  

## 2012-01-22 LAB — GLUCOSE, CAPILLARY: Glucose-Capillary: 99 mg/dL (ref 70–99)

## 2012-01-23 NOTE — Transfer of Care (Signed)
Immediate Anesthesia Transfer of Care Note  Patient: [name]  Procedure(s) Performed: Procedure(s) (LRB): AMPUTATION BELOW KNEE (Left)  Patient Location: PACU  Anesthesia Type: General  Level of Consciousness: awake, alert , oriented and patient cooperative  Airway & Oxygen Therapy: Patient Spontanous Breathing and Patient connected to nasal cannula oxygen  Post-op Assessment: Report given to PACU RN, Post -op Vital signs reviewed and stable and Patient moving all extremities X 4  Post vital signs: Reviewed and stable  Complications: No apparent anesthesia complications and Patient re-intubated

## 2012-07-22 ENCOUNTER — Other Ambulatory Visit (HOSPITAL_COMMUNITY): Payer: Self-pay | Admitting: Orthopedic Surgery

## 2012-07-28 ENCOUNTER — Encounter (HOSPITAL_COMMUNITY): Payer: Self-pay | Admitting: Pharmacy Technician

## 2012-07-31 ENCOUNTER — Encounter (HOSPITAL_COMMUNITY): Payer: Self-pay | Admitting: *Deleted

## 2012-07-31 MED ORDER — CEFAZOLIN SODIUM-DEXTROSE 2-3 GM-% IV SOLR
2.0000 g | INTRAVENOUS | Status: AC
  Administered 2012-08-01: 2 g via INTRAVENOUS

## 2012-08-01 ENCOUNTER — Inpatient Hospital Stay (HOSPITAL_COMMUNITY)
Admission: RE | Admit: 2012-08-01 | Discharge: 2012-08-04 | DRG: 240 | Disposition: A | Discharge status: Skilled Nursing Facility | Payer: Medicaid Other | Source: Ambulatory Visit | Attending: Orthopedic Surgery | Admitting: Orthopedic Surgery

## 2012-08-01 ENCOUNTER — Ambulatory Visit (HOSPITAL_COMMUNITY): Payer: Medicaid Other | Admitting: Certified Registered"

## 2012-08-01 ENCOUNTER — Encounter (HOSPITAL_COMMUNITY)
Admission: RE | Disposition: A | Discharge status: Skilled Nursing Facility | Payer: Self-pay | Source: Ambulatory Visit | Attending: Orthopedic Surgery

## 2012-08-01 ENCOUNTER — Encounter (HOSPITAL_COMMUNITY): Payer: Self-pay | Admitting: Certified Registered"

## 2012-08-01 ENCOUNTER — Encounter (HOSPITAL_COMMUNITY): Payer: Self-pay | Admitting: *Deleted

## 2012-08-01 DIAGNOSIS — I96 Gangrene, not elsewhere classified: Secondary | ICD-10-CM | POA: Diagnosis present

## 2012-08-01 DIAGNOSIS — F329 Major depressive disorder, single episode, unspecified: Secondary | ICD-10-CM | POA: Diagnosis present

## 2012-08-01 DIAGNOSIS — Z8673 Personal history of transient ischemic attack (TIA), and cerebral infarction without residual deficits: Secondary | ICD-10-CM

## 2012-08-01 DIAGNOSIS — I10 Essential (primary) hypertension: Secondary | ICD-10-CM | POA: Diagnosis present

## 2012-08-01 DIAGNOSIS — I428 Other cardiomyopathies: Secondary | ICD-10-CM | POA: Diagnosis present

## 2012-08-01 DIAGNOSIS — F3289 Other specified depressive episodes: Secondary | ICD-10-CM | POA: Diagnosis present

## 2012-08-01 DIAGNOSIS — E1159 Type 2 diabetes mellitus with other circulatory complications: Principal | ICD-10-CM | POA: Diagnosis present

## 2012-08-01 DIAGNOSIS — M9080 Osteopathy in diseases classified elsewhere, unspecified site: Secondary | ICD-10-CM | POA: Diagnosis present

## 2012-08-01 DIAGNOSIS — L97509 Non-pressure chronic ulcer of other part of unspecified foot with unspecified severity: Secondary | ICD-10-CM | POA: Diagnosis present

## 2012-08-01 DIAGNOSIS — F411 Generalized anxiety disorder: Secondary | ICD-10-CM | POA: Diagnosis present

## 2012-08-01 DIAGNOSIS — M869 Osteomyelitis, unspecified: Secondary | ICD-10-CM | POA: Diagnosis present

## 2012-08-01 DIAGNOSIS — E669 Obesity, unspecified: Secondary | ICD-10-CM | POA: Diagnosis present

## 2012-08-01 DIAGNOSIS — E119 Type 2 diabetes mellitus without complications: Secondary | ICD-10-CM

## 2012-08-01 DIAGNOSIS — E1142 Type 2 diabetes mellitus with diabetic polyneuropathy: Secondary | ICD-10-CM | POA: Diagnosis present

## 2012-08-01 DIAGNOSIS — K219 Gastro-esophageal reflux disease without esophagitis: Secondary | ICD-10-CM | POA: Diagnosis present

## 2012-08-01 DIAGNOSIS — E1169 Type 2 diabetes mellitus with other specified complication: Secondary | ICD-10-CM | POA: Diagnosis present

## 2012-08-01 DIAGNOSIS — E785 Hyperlipidemia, unspecified: Secondary | ICD-10-CM | POA: Diagnosis present

## 2012-08-01 DIAGNOSIS — E1149 Type 2 diabetes mellitus with other diabetic neurological complication: Secondary | ICD-10-CM | POA: Diagnosis present

## 2012-08-01 DIAGNOSIS — M1990 Unspecified osteoarthritis, unspecified site: Secondary | ICD-10-CM | POA: Diagnosis present

## 2012-08-01 HISTORY — DX: Pneumonia, unspecified organism: J18.9

## 2012-08-01 HISTORY — PX: AMPUTATION: SHX166

## 2012-08-01 LAB — COMPREHENSIVE METABOLIC PANEL
AST: 30 U/L (ref 0–37)
BUN: 42 mg/dL — ABNORMAL HIGH (ref 6–23)
CO2: 22 mEq/L (ref 19–32)
Chloride: 107 mEq/L (ref 96–112)
Creatinine, Ser: 1.45 mg/dL — ABNORMAL HIGH (ref 0.50–1.10)
GFR calc non Af Amer: 42 mL/min — ABNORMAL LOW (ref >90)
Total Bilirubin: 0.1 mg/dL — ABNORMAL LOW (ref 0.3–1.2)

## 2012-08-01 LAB — CBC
HCT: 30.8 % — ABNORMAL LOW (ref 36.0–46.0)
Hemoglobin: 10.4 g/dL — ABNORMAL LOW (ref 12.0–15.0)
MCV: 91.7 fL (ref 78.0–100.0)
RBC: 3.36 MIL/uL — ABNORMAL LOW (ref 3.87–5.11)
WBC: 8.3 10*3/uL (ref 4.0–10.5)

## 2012-08-01 LAB — GLUCOSE, CAPILLARY
Glucose-Capillary: 87 mg/dL (ref 70–99)
Glucose-Capillary: 137 mg/dL — ABNORMAL HIGH (ref 70–99)

## 2012-08-01 LAB — PROTIME-INR
INR: 1.1 (ref 0.00–1.49)
Prothrombin Time: 14.1 seconds (ref 11.6–15.2)

## 2012-08-01 SURGERY — AMPUTATION BELOW KNEE
Anesthesia: General | Site: Leg Lower | Laterality: Right | Wound class: Clean

## 2012-08-01 MED ORDER — ONDANSETRON HCL 4 MG/2ML IJ SOLN
4.0000 mg | Freq: Four times a day (QID) | INTRAMUSCULAR | Status: DC | PRN
  Administered 2012-08-01: 4 mg via INTRAVENOUS
  Filled 2012-08-01: qty 2

## 2012-08-01 MED ORDER — ONDANSETRON HCL 4 MG PO TABS: 4.0000 mg | ORAL_TABLET | Freq: Four times a day (QID) | ORAL | Status: DC | PRN

## 2012-08-01 MED ORDER — LIDOCAINE HCL (CARDIAC) 20 MG/ML IV SOLN
INTRAVENOUS | Status: DC | PRN
  Administered 2012-08-01: 50 mg via INTRAVENOUS

## 2012-08-01 MED ORDER — GABAPENTIN 300 MG PO CAPS
600.0000 mg | ORAL_CAPSULE | Freq: Four times a day (QID) | ORAL | Status: DC
  Administered 2012-08-01 – 2012-08-04 (×10): 600 mg via ORAL
  Filled 2012-08-01 (×13): qty 2

## 2012-08-01 MED ORDER — CEFAZOLIN SODIUM-DEXTROSE 2-3 GM-% IV SOLR
INTRAVENOUS | Status: AC
  Filled 2012-08-01: qty 50

## 2012-08-01 MED ORDER — SIMVASTATIN 5 MG PO TABS
5.0000 mg | ORAL_TABLET | Freq: Every day | ORAL | Status: DC
  Administered 2012-08-01 – 2012-08-03 (×3): 5 mg via ORAL
  Filled 2012-08-01 (×4): qty 1

## 2012-08-01 MED ORDER — MIDAZOLAM HCL 5 MG/5ML IJ SOLN
INTRAMUSCULAR | Status: DC | PRN
  Administered 2012-08-01: 2 mg via INTRAVENOUS

## 2012-08-01 MED ORDER — OXYCODONE-ACETAMINOPHEN 5-325 MG PO TABS
1.0000 | ORAL_TABLET | ORAL | Status: DC | PRN
  Administered 2012-08-01 – 2012-08-04 (×9): 2 via ORAL
  Filled 2012-08-01 (×9): qty 2

## 2012-08-01 MED ORDER — WARFARIN SODIUM 5 MG PO TABS: 5.0000 mg | ORAL_TABLET | Freq: Every day | ORAL | Status: DC

## 2012-08-01 MED ORDER — METHYLPHENIDATE HCL 5 MG PO TABS
5.0000 mg | ORAL_TABLET | Freq: Two times a day (BID) | ORAL | Status: DC
  Administered 2012-08-02 – 2012-08-04 (×4): 5 mg via ORAL
  Filled 2012-08-01 (×4): qty 1

## 2012-08-01 MED ORDER — SERTRALINE HCL 100 MG PO TABS
100.0000 mg | ORAL_TABLET | Freq: Every day | ORAL | Status: DC
  Administered 2012-08-02 – 2012-08-04 (×3): 100 mg via ORAL
  Filled 2012-08-01 (×3): qty 1

## 2012-08-01 MED ORDER — PRO-STAT SUGAR FREE PO LIQD
30.0000 mL | Freq: Three times a day (TID) | ORAL | Status: DC
  Administered 2012-08-02 – 2012-08-04 (×8): 30 mL via ORAL
  Filled 2012-08-01 (×10): qty 30

## 2012-08-01 MED ORDER — ONDANSETRON HCL 4 MG/2ML IJ SOLN: 4.0000 mg | Freq: Four times a day (QID) | INTRAMUSCULAR | Status: DC | PRN

## 2012-08-01 MED ORDER — FENTANYL CITRATE 0.05 MG/ML IJ SOLN
INTRAMUSCULAR | Status: AC
  Filled 2012-08-01: qty 2

## 2012-08-01 MED ORDER — BIOTENE DRY MOUTH MT LIQD
15.0000 mL | Freq: Two times a day (BID) | OROMUCOSAL | Status: DC
  Administered 2012-08-01 – 2012-08-04 (×6): 15 mL via OROMUCOSAL

## 2012-08-01 MED ORDER — OXYCODONE HCL 5 MG/5ML PO SOLN: 5.0000 mg | Freq: Once | ORAL | Status: DC | PRN

## 2012-08-01 MED ORDER — CYCLOSPORINE 0.05 % OP EMUL
1.0000 [drp] | Freq: Two times a day (BID) | OPHTHALMIC | Status: DC
  Administered 2012-08-01 – 2012-08-04 (×6): 1 [drp] via OPHTHALMIC
  Filled 2012-08-01 (×7): qty 1

## 2012-08-01 MED ORDER — METOCLOPRAMIDE HCL 5 MG/ML IJ SOLN: 5.0000 mg | Freq: Three times a day (TID) | INTRAMUSCULAR | Status: DC | PRN

## 2012-08-01 MED ORDER — FENTANYL CITRATE 0.05 MG/ML IJ SOLN
INTRAMUSCULAR | Status: DC | PRN
  Administered 2012-08-01: 50 ug via INTRAVENOUS
  Administered 2012-08-01: 25 ug via INTRAVENOUS

## 2012-08-01 MED ORDER — SODIUM CHLORIDE 0.9 % IV SOLN: INTRAVENOUS | Status: DC

## 2012-08-01 MED ORDER — ONDANSETRON HCL 4 MG/2ML IJ SOLN
INTRAMUSCULAR | Status: DC | PRN
  Administered 2012-08-01: 4 mg via INTRAVENOUS

## 2012-08-01 MED ORDER — WARFARIN - PHARMACIST DOSING INPATIENT: Freq: Every day | Status: DC

## 2012-08-01 MED ORDER — MUPIROCIN 2 % EX OINT
TOPICAL_OINTMENT | CUTANEOUS | Status: AC
  Administered 2012-08-01: 1 via NASAL
  Filled 2012-08-01: qty 22

## 2012-08-01 MED ORDER — HYDROMORPHONE HCL PF 1 MG/ML IJ SOLN
INTRAMUSCULAR | Status: AC
  Filled 2012-08-01: qty 1

## 2012-08-01 MED ORDER — WARFARIN SODIUM 7.5 MG PO TABS
7.5000 mg | ORAL_TABLET | Freq: Once | ORAL | Status: AC
  Administered 2012-08-01: 7.5 mg via ORAL
  Filled 2012-08-01: qty 1

## 2012-08-01 MED ORDER — PHENYTOIN SODIUM EXTENDED 100 MG PO CAPS
300.0000 mg | ORAL_CAPSULE | Freq: Every day | ORAL | Status: DC
Start: 2012-08-01 — End: 2012-08-04
  Administered 2012-08-01 – 2012-08-03 (×3): 300 mg via ORAL
  Filled 2012-08-01 (×4): qty 3

## 2012-08-01 MED ORDER — PHENYLEPHRINE HCL 10 MG/ML IJ SOLN
INTRAMUSCULAR | Status: DC | PRN
  Administered 2012-08-01 (×4): 40 ug via INTRAVENOUS

## 2012-08-01 MED ORDER — MUPIROCIN 2 % EX OINT: TOPICAL_OINTMENT | Freq: Two times a day (BID) | CUTANEOUS | Status: DC

## 2012-08-01 MED ORDER — METOCLOPRAMIDE HCL 10 MG PO TABS: 5.0000 mg | ORAL_TABLET | Freq: Three times a day (TID) | ORAL | Status: DC | PRN

## 2012-08-01 MED ORDER — HYDROMORPHONE HCL PF 1 MG/ML IJ SOLN
0.5000 mg | INTRAMUSCULAR | Status: DC | PRN
  Administered 2012-08-01 – 2012-08-02 (×3): 1 mg via INTRAVENOUS
  Filled 2012-08-01 (×2): qty 1

## 2012-08-01 MED ORDER — LACTATED RINGERS IV SOLN
INTRAVENOUS | Status: DC
  Administered 2012-08-01: 14:00:00 via INTRAVENOUS

## 2012-08-01 MED ORDER — ALPRAZOLAM 0.25 MG PO TABS
0.2500 mg | ORAL_TABLET | Freq: Three times a day (TID) | ORAL | Status: DC | PRN
  Administered 2012-08-01: 0.25 mg via ORAL
  Filled 2012-08-01: qty 1

## 2012-08-01 MED ORDER — AMLODIPINE BESYLATE 5 MG PO TABS
5.0000 mg | ORAL_TABLET | Freq: Every day | ORAL | Status: DC
  Administered 2012-08-02 – 2012-08-04 (×3): 5 mg via ORAL
  Filled 2012-08-01 (×3): qty 1

## 2012-08-01 MED ORDER — OXYCODONE HCL 5 MG PO TABS: 5.0000 mg | ORAL_TABLET | Freq: Once | ORAL | Status: DC | PRN

## 2012-08-01 MED ORDER — FENTANYL CITRATE 0.05 MG/ML IJ SOLN
25.0000 ug | INTRAMUSCULAR | Status: DC | PRN
  Administered 2012-08-01 (×2): 25 ug via INTRAVENOUS

## 2012-08-01 MED ORDER — METOPROLOL TARTRATE 100 MG PO TABS
100.0000 mg | ORAL_TABLET | Freq: Two times a day (BID) | ORAL | Status: DC
  Administered 2012-08-01 – 2012-08-04 (×6): 100 mg via ORAL
  Filled 2012-08-01 (×7): qty 1

## 2012-08-01 MED ORDER — PROPOFOL 10 MG/ML IV BOLUS
INTRAVENOUS | Status: DC | PRN
  Administered 2012-08-01: 150 mg via INTRAVENOUS

## 2012-08-01 MED ORDER — LACTATED RINGERS IV SOLN
INTRAVENOUS | Status: DC | PRN
  Administered 2012-08-01 (×2): via INTRAVENOUS

## 2012-08-01 MED ORDER — CEFAZOLIN SODIUM 1-5 GM-% IV SOLN
1.0000 g | Freq: Four times a day (QID) | INTRAVENOUS | Status: AC
  Administered 2012-08-01 – 2012-08-02 (×3): 1 g via INTRAVENOUS
  Filled 2012-08-01 (×3): qty 50

## 2012-08-01 MED ORDER — FERROUS SULFATE 325 (65 FE) MG PO TABS
325.0000 mg | ORAL_TABLET | Freq: Two times a day (BID) | ORAL | Status: DC
  Administered 2012-08-01 – 2012-08-04 (×6): 325 mg via ORAL
  Filled 2012-08-01 (×7): qty 1

## 2012-08-01 MED ORDER — HYDROCODONE-ACETAMINOPHEN 5-325 MG PO TABS: 1.0000 | ORAL_TABLET | ORAL | Status: DC | PRN

## 2012-08-01 MED ORDER — CHLORHEXIDINE GLUCONATE 0.12 % MT SOLN
15.0000 mL | Freq: Two times a day (BID) | OROMUCOSAL | Status: DC
  Administered 2012-08-01 – 2012-08-04 (×6): 15 mL via OROMUCOSAL
  Filled 2012-08-01 (×7): qty 15

## 2012-08-01 SURGICAL SUPPLY — 44 items
BANDAGE ESMARK 6X9 LF (GAUZE/BANDAGES/DRESSINGS) ×1 IMPLANT
BANDAGE GAUZE ELAST BULKY 4 IN (GAUZE/BANDAGES/DRESSINGS) ×2 IMPLANT
BLADE SAW RECIP 87.9 MT (BLADE) ×2 IMPLANT
BLADE SURG 21 STRL SS (BLADE) ×2 IMPLANT
BNDG COHESIVE 6X5 TAN STRL LF (GAUZE/BANDAGES/DRESSINGS) ×2 IMPLANT
BNDG ESMARK 6X9 LF (GAUZE/BANDAGES/DRESSINGS) ×2
CLOTH BEACON ORANGE TIMEOUT ST (SAFETY) ×2 IMPLANT
COVER SURGICAL LIGHT HANDLE (MISCELLANEOUS) ×2 IMPLANT
CUFF TOURNIQUET SINGLE 34IN LL (TOURNIQUET CUFF) ×2 IMPLANT
CUFF TOURNIQUET SINGLE 44IN (TOURNIQUET CUFF) IMPLANT
DRAIN PENROSE 1/2X12 LTX STRL (WOUND CARE) IMPLANT
DRAPE EXTREMITY T 121X128X90 (DRAPE) ×2 IMPLANT
DRAPE PROXIMA HALF (DRAPES) ×4 IMPLANT
DRAPE U-SHAPE 47X51 STRL (DRAPES) ×4 IMPLANT
DRSG ADAPTIC 3X8 NADH LF (GAUZE/BANDAGES/DRESSINGS) ×2 IMPLANT
DRSG PAD ABDOMINAL 8X10 ST (GAUZE/BANDAGES/DRESSINGS) ×2 IMPLANT
DURAPREP 26ML APPLICATOR (WOUND CARE) ×2 IMPLANT
ELECT REM PT RETURN 9FT ADLT (ELECTROSURGICAL) ×2
ELECTRODE REM PT RTRN 9FT ADLT (ELECTROSURGICAL) ×1 IMPLANT
EVACUATOR 1/8 PVC DRAIN (DRAIN) IMPLANT
GLOVE BIOGEL PI IND STRL 9 (GLOVE) ×1 IMPLANT
GLOVE BIOGEL PI INDICATOR 9 (GLOVE) ×1
GLOVE SURG ORTHO 9.0 STRL STRW (GLOVE) ×2 IMPLANT
GOWN PREVENTION PLUS XLARGE (GOWN DISPOSABLE) ×2 IMPLANT
GOWN SRG XL XLNG 56XLVL 4 (GOWN DISPOSABLE) ×1 IMPLANT
GOWN STRL NON-REIN XL XLG LVL4 (GOWN DISPOSABLE) ×1
KIT BASIN OR (CUSTOM PROCEDURE TRAY) ×2 IMPLANT
KIT ROOM TURNOVER OR (KITS) ×2 IMPLANT
MANIFOLD NEPTUNE II (INSTRUMENTS) ×2 IMPLANT
NS IRRIG 1000ML POUR BTL (IV SOLUTION) ×2 IMPLANT
PACK GENERAL/GYN (CUSTOM PROCEDURE TRAY) ×2 IMPLANT
PAD ARMBOARD 7.5X6 YLW CONV (MISCELLANEOUS) ×4 IMPLANT
SPONGE GAUZE 4X4 12PLY (GAUZE/BANDAGES/DRESSINGS) ×2 IMPLANT
SPONGE LAP 18X18 X RAY DECT (DISPOSABLE) ×2 IMPLANT
STAPLER VISISTAT 35W (STAPLE) IMPLANT
STOCKINETTE IMPERVIOUS LG (DRAPES) ×2 IMPLANT
SUT PDS AB 1 CT  36 (SUTURE)
SUT PDS AB 1 CT 36 (SUTURE) IMPLANT
SUT SILK 2 0 (SUTURE) ×1
SUT SILK 2-0 18XBRD TIE 12 (SUTURE) ×1 IMPLANT
TOWEL OR 17X24 6PK STRL BLUE (TOWEL DISPOSABLE) ×2 IMPLANT
TOWEL OR 17X26 10 PK STRL BLUE (TOWEL DISPOSABLE) ×2 IMPLANT
TUBE ANAEROBIC SPECIMEN COL (MISCELLANEOUS) IMPLANT
WATER STERILE IRR 1000ML POUR (IV SOLUTION) ×2 IMPLANT

## 2012-08-01 NOTE — Op Note (Signed)
OPERATIVE REPORT  DATE OF SURGERY: 08/01/2012  PATIENT:  [name],  52 y.o. female  PRE-OPERATIVE DIAGNOSIS:  Gangrene Right Foot  POST-OPERATIVE DIAGNOSIS:  Gangrene Right Foot  PROCEDURE:  Procedure(s): Right Below Knee Amputation  SURGEON:  Surgeon(s): Newt Minion, MD  ANESTHESIA:   general  EBL:  Minimal ML  SPECIMEN:  Source of Specimen:  Right leg  TOURNIQUET:   Total Tourniquet Time Documented: Thigh (Right) - 4 minutes Total: Thigh (Right) - 4 minutes   PROCEDURE DETAILS: Patient is a 52 year old woman with peripheral vascular disease diabetes who is status post a left transtibial amputation. Patient presents at this time with progressive osteomyelitis and gangrenous changes to the right foot she has failed conservative treatment and presents at this time for transtibial amputation. Risks and benefits were discussed patient states she understands and wished to proceed at this time. Description of procedure patient brought to the operating room and underwent a general anesthetic. After adequate levels and anesthesia were obtained patient's right lower extremity was prepped using DuraPrep draped in a sterile field and the foot was draped out of sterile field with an impervious stockinette. A transverse incision was made 11 cm distal to the tibial tubercle this curved proximally and a large posterior flap was created. The tibia was transected just proximal to the skin incision beveled anteriorly and the fibula was transected just proximal to the tibial incision. Amputation knife was used to create a large posterior flap the sciatic nerve was pulled cut and allowed to retract the vascular bundles were suture ligated with 2-0 silk. The tourniquet was deflated after 4 minutes hemostasis was obtained the deep and superficial fascial layers were closed using #1 PDS the skin was closed using staples. The wound was covered with Adaptic orthopedic sponges AB dressing Kerlix and  Coban. Patient was extubated taken to the PACU in stable condition.  PLAN OF CARE: Admit to inpatient   PATIENT DISPOSITION:  PACU - hemodynamically stable.   Newt Minion, MD 08/01/2012 3:12 PM

## 2012-08-01 NOTE — Anesthesia Procedure Notes (Signed)
Procedure Name: LMA Insertion Date/Time: 08/01/2012 2:40 PM Performed by: Luciana Axe K Pre-anesthesia Checklist: Patient identified, Emergency Drugs available, Suction available, Patient being monitored and Timeout performed Patient Re-evaluated:Patient Re-evaluated prior to inductionOxygen Delivery Method: Circle system utilized Preoxygenation: Pre-oxygenation with 100% oxygen Intubation Type: IV induction Ventilation: Mask ventilation without difficulty LMA: LMA inserted LMA Size: 4.0 Number of attempts: 1 Placement Confirmation: positive ETCO2,  CO2 detector and breath sounds checked- equal and bilateral Tube secured with: Tape Dental Injury: Teeth and Oropharynx as per pre-operative assessment

## 2012-08-01 NOTE — Transfer of Care (Signed)
Immediate Anesthesia Transfer of Care Note  Patient: [name]  Procedure(s) Performed: Procedure(s) with comments: Right Below Knee Amputation (Right) - Right Below Knee Amputation  Patient Location: PACU  Anesthesia Type:General  Level of Consciousness: awake, alert , oriented and patient cooperative  Airway & Oxygen Therapy: Patient Spontanous Breathing and Patient connected to nasal cannula oxygen  Post-op Assessment: Report given to PACU RN and Post -op Vital signs reviewed and stable  Post vital signs: Reviewed and stable  Complications: No apparent anesthesia complications

## 2012-08-01 NOTE — Anesthesia Preprocedure Evaluation (Addendum)
Anesthesia Evaluation  Patient identified by MRN, date of birth, ID band Patient awake    Reviewed: Allergy & Precautions, H&P , NPO status , Patient's Chart, lab work & pertinent test results, reviewed documented beta blocker date and time   History of Anesthesia Complications Negative for: history of anesthetic complications  Airway Mallampati: II TM Distance: >3 FB Neck ROM: full    Dental  (+) Teeth Intact, Poor Dentition and Dental Advisory Given   Pulmonary shortness of breath, sleep apnea ,  breath sounds clear to auscultation        Cardiovascular hypertension, Pt. on medications and Pt. on home beta blockers + Peripheral Vascular Disease Rhythm:Regular Rate:Normal     Neuro/Psych  Headaches, Anxiety Depression CVA, Residual Symptoms    GI/Hepatic Neg liver ROS, GERD-  Medicated and Controlled,  Endo/Other  diabetes, Type 2, Insulin Dependentobese  Renal/GU Renal InsufficiencyRenal disease     Musculoskeletal   Abdominal   Peds  Hematology   Anesthesia Other Findings   Reproductive/Obstetrics                         Anesthesia Physical Anesthesia Plan  ASA: III  Anesthesia Plan: General   Post-op Pain Management:    Induction: Intravenous  Airway Management Planned: LMA  Additional Equipment:   Intra-op Plan:   Post-operative Plan:   Informed Consent: I have reviewed the patients History and Physical, chart, labs and discussed the procedure including the risks, benefits and alternatives for the proposed anesthesia with the patient or authorized representative who has indicated his/her understanding and acceptance.     Plan Discussed with: CRNA and Surgeon  Anesthesia Plan Comments:         Anesthesia Quick Evaluation

## 2012-08-01 NOTE — Progress Notes (Signed)
Pecolia Ades, RN spoke with Gus Puma RN at [facility] facility and reviewed meds taken today and stated patient was NPO except for sip of water with am meds.

## 2012-08-01 NOTE — Anesthesia Postprocedure Evaluation (Signed)
  Anesthesia Post-op Note  Patient: [name]  Procedure(s) Performed: Procedure(s) with comments: Right Below Knee Amputation (Right) - Right Below Knee Amputation  Patient Location: PACU  Anesthesia Type:General  Level of Consciousness: awake  Airway and Oxygen Therapy: Patient Spontanous Breathing  Post-op Pain: mild  Post-op Assessment: Post-op Vital signs reviewed, Patient's Cardiovascular Status Stable, Respiratory Function Stable, Patent Airway, No signs of Nausea or vomiting and Pain level controlled  Post-op Vital Signs: stable  Complications: No apparent anesthesia complications

## 2012-08-01 NOTE — Preoperative (Signed)
Beta Blockers   Reason not to administer Beta Blockers:Not Applicable 

## 2012-08-01 NOTE — Consult Note (Signed)
ANTICOAGULATION CONSULT NOTE - Follow Up Consult  Pharmacy Consult for Coumadin Indication: VTE prophylaxis  Allergies Allergies  Allergen Reactions  . Ace Inhibitors Shortness Of Breath  . Latex Hives  . Lisinopril Other (See Comments)    Per MAR  . Vancomycin Other (See Comments)    Per [facility]    Patient Measurements: Height: 5\' 7"  (170.2 cm) Weight: 220 lb (99.791 kg) IBW/kg (Calculated) : 61.6  Vital Signs: BP 181/82  Pulse 96  Temp(Src) 98.3 F (36.8 C) (Oral)  Ht 5\' 7"  (1.702 m)  Wt 220 lb (99.791 kg)  BMI 34.45 kg/m2  Labs:  Recent Labs  08/01/12 1300  HGB 10.4*  HCT 30.8*  PLT 445*  APTT 49*  LABPROT 14.1  INR 1.10  CREATININE 1.45*   Estimated Creatinine Clearance: 57 ml/min (by C-G formula based on Cr of 1.45).  Medications:  Prescriptions prior to admission  Medication Sig Dispense Refill  . acetaminophen (TYLENOL) 325 MG tablet Take 650 mg by mouth every 4 (four) hours as needed. For pain      . ALPRAZolam (XANAX) 0.25 MG tablet Take 0.25 mg by mouth 3 (three) times daily as needed. For anxiety      . Alum & Mag Hydroxide-Simeth (MAALOX PLUS PO) Take 30 mLs by mouth 4 (four) times daily as needed. For indigestion.      Marland Kitchen amLODipine (NORVASC) 5 MG tablet Take 5 mg by mouth daily.      Marland Kitchen antiseptic oral rinse (BIOTENE) LIQD 15 mLs by Mouth Rinse route 2 (two) times daily.      . chlorhexidine (PERIDEX) 0.12 % solution Use as directed 15 mLs in the mouth or throat 2 (two) times daily.      . cloNIDine (CATAPRES) 0.1 MG tablet Take 0.1 mg by mouth every 8 (eight) hours as needed. For blood pressure SBP>OR = 180      . cycloSPORINE (RESTASIS) 0.05 % ophthalmic emulsion Place 1 drop into both eyes 2 (two) times daily.       . feeding supplement (PRO-STAT SUGAR FREE 64) LIQD Take 30 mLs by mouth 3 (three) times daily with meals.  900 mL  1  . ferrous sulfate 325 (65 FE) MG tablet Take 325 mg by mouth 2 (two) times daily.       Marland Kitchen gabapentin (NEURONTIN) 300  MG capsule Take 600 mg by mouth 4 (four) times daily.      Marland Kitchen HYDROcodone-acetaminophen (NORCO) 5-325 MG per tablet Take 2 tablets by mouth every 6 (six) hours as needed for pain. For pain      . insulin aspart (NOVOLOG) 100 UNIT/ML injection Inject 5 Units into the skin 4 (four) times daily -  before meals and at bedtime. 5 units for blood sugar>150      . methylphenidate (RITALIN) 10 MG tablet Take 5 mg by mouth 2 (two) times daily.       . metoprolol (LOPRESSOR) 100 MG tablet Take 100 mg by mouth 2 (two) times daily.      . Multiple Vitamins-Minerals (MULTIVITAMINS THER. W/MINERALS) TABS Take 2 tablets by mouth daily.      . phenytoin (DILANTIN) 100 MG ER capsule Take 300 mg by mouth at bedtime.      . pravastatin (PRAVACHOL) 40 MG tablet Take 40 mg by mouth at bedtime.      . sertraline (ZOLOFT) 100 MG tablet Take 100 mg by mouth daily.      Marland Kitchen warfarin (COUMADIN) 5 MG tablet Take 5  mg by mouth daily.      . silver sulfADIAZINE (SILVADENE) 1 % cream Apply 1 application topically daily as needed (to wound).        Assessment:  Patient is a 52 y/o female with a complicated medical history including PVD, Cardiomyopathy, CVA, and chronic Coumadin now s/p R-BKA who is to have her Coumadin restarted per protocol.  Current INR 1.1  Goal of Therapy:  Target INR 2-3    Plan:  Coumadin 7.5 mg today.  Daily INR's, CBC.  Monitor for bleeding complications  Selena Batten.D 08/01/2012, 5:27 PM

## 2012-08-01 NOTE — H&P (Signed)
[name] is an 52 y.o. female.   Chief Complaint: Ulceration osteomyelitis right lower extremity HPI: Patient is a 52 year old woman with diabetes peripheral vascular disease who presents after failure conservative treatment with osteomyelitis abscess ulceration right lower extremity.  Past Medical History  Diagnosis Date  . HTN (hypertension)   . HLD (hyperlipidemia)   . History of methicillin resistant staphylococcus aureus (MRSA)   . CVA (cerebral infarction)   . Anemia   . Tachycardia   . Obesity   . Lower back pain     Chronic  . Idiopathic peripheral neuropathy     NOS  . DJD (degenerative joint disease)   . Headache   . GERD (gastroesophageal reflux disease)   . Depression   . Anxiety   . Hypokalemia     resolved  . Cerebral infarct     left parietal and bilateral  . Esophagitis     distal  . Fluid overload     compensated  . Urinary retention   . H/O: pneumonia   . History of bacteremia   . Surgery, other elective     of the ear  . Stroke     Hx of left frontoparietal stroke in 12/11. History of right brain stroke.  . Cardiomyopathy     Echocardiogram 04/23/11: Mild LVH, EF 30-35%, mild MR, mild LAE, mild-moderate RVE.  TEE 04/25/11: Moderate LVH, severe biventricular dysfunction, EF 20-25%, moderate to severe MR, moderate TR, biatrial enlargement, no PFO, negative bubble study  . Shortness of breath     with exertion   . Sleep apnea     no machine used  pt does not remember test for sleep apnea  . DM type 2 (diabetes mellitus, type 2)     type two   oral meds only   . 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 UTI (lower urinary tract infection) 10/19/11    had approx April 2013  . Osteomyelitis   . DJD (degenerative joint disease)   . Urinary retention   . Pneumonia     Past Surgical History  Procedure Laterality Date  . Tympanoplasty    . Toe amputation      left 2nd toe  . Dilation and curettage of uterus      history  . Tee without  cardioversion  04/25/2011    Procedure: TRANSESOPHAGEAL ECHOCARDIOGRAM (TEE);  Surgeon: Jolaine Artist, MD;  Location: [facility] ENDOSCOPY;  Service: Cardiovascular;  Laterality: N/A;  . Esophagogastroduodenoscopy  08/14/2011    Procedure: ESOPHAGOGASTRODUODENOSCOPY (EGD);  Surgeon: Scarlette Shorts, MD;  Location: [facility] ENDOSCOPY;  Service: Endoscopy;  Laterality: N/A;  . Amputation  01/18/2012    Procedure: AMPUTATION BELOW KNEE;  Surgeon: Newt Minion, MD;  Location: [place];  Service: Orthopedics;  Laterality: Left;  Left Below Knee Amputation    Family History  Problem Relation Age of Onset  . Coronary artery disease    . Diabetes    . Cancer Mother   . Diabetes Mother   . Heart disease Father   . Hyperlipidemia Father   . Hypertension Father   . Stroke Father   . Heart disease Brother   . Anesthesia problems Neg Hx   . Hypotension Neg Hx   . Malignant hyperthermia Neg Hx   . Pseudochol deficiency Neg Hx    Social History:  reports that she has never smoked. She has never used smokeless tobacco. She reports that  drinks  alcohol. She reports that she does not use illicit drugs.  Allergies:  Allergies  Allergen Reactions  . Ace Inhibitors Shortness Of Breath  . Latex Hives  . Lisinopril Other (See Comments)    Per MAR  . Vancomycin Other (See Comments)    Per MAR    No prescriptions prior to admission    No results found for this or any previous visit (from the past 48 hour(s)). No results found.  Review of Systems  All other systems reviewed and are negative.    There were no vitals taken for this visit. Physical Exam  Abscess ulceration osteomyelitis right lower extremity Assessment/Plan Assessment: Abscess ulceration OSTEOMYELITIS right lower extremity.  Plan: Due to failure conservative wound care patient presents at this time for transtibial amputation on the right. Risks and benefits were discussed including infection neurovascular injury nonhealing of the wound need for  higher level amputation patient states he understands was pursued this time.  DUDA,MARCUS V 08/01/2012, 6:40 AM

## 2012-08-02 LAB — GLUCOSE, CAPILLARY: Glucose-Capillary: 176 mg/dL — ABNORMAL HIGH (ref 70–99)

## 2012-08-02 LAB — CBC
MCH: 30.2 pg (ref 26.0–34.0)
MCHC: 33.1 g/dL (ref 30.0–36.0)
MCV: 91.2 fL (ref 78.0–100.0)
Platelets: 453 10*3/uL — ABNORMAL HIGH (ref 150–400)

## 2012-08-02 LAB — PROTIME-INR: Prothrombin Time: 16.3 seconds — ABNORMAL HIGH (ref 11.6–15.2)

## 2012-08-02 MED ORDER — INSULIN ASPART 100 UNIT/ML ~~LOC~~ SOLN
5.0000 [IU] | Freq: Three times a day (TID) | SUBCUTANEOUS | Status: DC
  Administered 2012-08-02 – 2012-08-03 (×4): 5 [IU] via SUBCUTANEOUS

## 2012-08-02 MED ORDER — WARFARIN SODIUM 7.5 MG PO TABS
7.5000 mg | ORAL_TABLET | Freq: Once | ORAL | Status: AC
  Administered 2012-08-02: 7.5 mg via ORAL
  Filled 2012-08-02: qty 1

## 2012-08-02 NOTE — Progress Notes (Signed)
Patient ID: [name], female   DOB: 04/30/1961, 52 y.o.   MRN: [id number] Postoperative day 1 transtibial amputation. Patient will require discharge to short-term skilled nursing facility. Patient is comfortable this morning no complaints of pain.

## 2012-08-02 NOTE — Consult Note (Signed)
ANTICOAGULATION CONSULT NOTE - Follow Up Consult  Pharmacy Consult for Coumadin Indication: VTE prophylaxis/  Chronic coumadin PTA -  medical history including PVD, Cardiomyopathy, CVA.  Allergies Allergies  Allergen Reactions  . Ace Inhibitors Shortness Of Breath  . Latex Hives  . Lisinopril Other (See Comments)    Per MAR  . Vancomycin Other (See Comments)    Per [facility]    Patient Measurements: Height: 5\' 7"  (170.2 cm) Weight: 220 lb (99.791 kg) IBW/kg (Calculated) : 61.6  Vital Signs: BP 154/93  Pulse 97  Temp(Src) 99.6 F (37.6 C) (Oral)  Resp 18  Ht 5\' 7"  (1.702 m)  Wt 220 lb (99.791 kg)  BMI 34.45 kg/m2  SpO2 99%  Labs:  Recent Labs  08/01/12 1300 08/02/12 0630  HGB 10.4* 9.9*  HCT 30.8* 29.9*  PLT 445* 453*  APTT 49*  --   LABPROT 14.1 16.3*  INR 1.10 1.34  CREATININE 1.45*  --    Estimated Creatinine Clearance: 57 ml/min (by C-G formula based on Cr of 1.45).  Medications:  Prescriptions prior to admission  Medication Sig Dispense Refill  . acetaminophen (TYLENOL) 325 MG tablet Take 650 mg by mouth every 4 (four) hours as needed. For pain      . ALPRAZolam (XANAX) 0.25 MG tablet Take 0.25 mg by mouth 3 (three) times daily as needed. For anxiety      . Alum & Mag Hydroxide-Simeth (MAALOX PLUS PO) Take 30 mLs by mouth 4 (four) times daily as needed. For indigestion.      Marland Kitchen amLODipine (NORVASC) 5 MG tablet Take 5 mg by mouth daily.      Marland Kitchen antiseptic oral rinse (BIOTENE) LIQD 15 mLs by Mouth Rinse route 2 (two) times daily.      . chlorhexidine (PERIDEX) 0.12 % solution Use as directed 15 mLs in the mouth or throat 2 (two) times daily.      . cloNIDine (CATAPRES) 0.1 MG tablet Take 0.1 mg by mouth every 8 (eight) hours as needed. For blood pressure SBP>OR = 180      . cycloSPORINE (RESTASIS) 0.05 % ophthalmic emulsion Place 1 drop into both eyes 2 (two) times daily.       . feeding supplement (PRO-STAT SUGAR FREE 64) LIQD Take 30 mLs by mouth 3 (three) times  daily with meals.  900 mL  1  . ferrous sulfate 325 (65 FE) MG tablet Take 325 mg by mouth 2 (two) times daily.       Marland Kitchen gabapentin (NEURONTIN) 300 MG capsule Take 600 mg by mouth 4 (four) times daily.      Marland Kitchen HYDROcodone-acetaminophen (NORCO) 5-325 MG per tablet Take 2 tablets by mouth every 6 (six) hours as needed for pain. For pain      . insulin aspart (NOVOLOG) 100 UNIT/ML injection Inject 5 Units into the skin 4 (four) times daily -  before meals and at bedtime. 5 units for blood sugar>150      . methylphenidate (RITALIN) 10 MG tablet Take 5 mg by mouth 2 (two) times daily.       . metoprolol (LOPRESSOR) 100 MG tablet Take 100 mg by mouth 2 (two) times daily.      . Multiple Vitamins-Minerals (MULTIVITAMINS THER. W/MINERALS) TABS Take 2 tablets by mouth daily.      . phenytoin (DILANTIN) 100 MG ER capsule Take 300 mg by mouth at bedtime.      . pravastatin (PRAVACHOL) 40 MG tablet Take 40 mg by mouth at  bedtime.      . sertraline (ZOLOFT) 100 MG tablet Take 100 mg by mouth daily.      Marland Kitchen warfarin (COUMADIN) 5 MG tablet Take 5 mg by mouth daily.      . silver sulfADIAZINE (SILVADENE) 1 % cream Apply 1 application topically daily as needed (to wound).        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 Failure  conservative treatment with osteomyelitis abscess ulceration right lower extremity.   PTA dose of coumadin was 5mg  daily with dose held prior to admit for surgery.   Current INR 1.34   H/H slight decrease, pltc stable. No bleeding noted.    Goal of Therapy:  Target INR 2-3    Plan:  Coumadin 7.5 mg today.  Daily INR's, CBC.  Monitor for bleeding complications  Nicole Cella, RPh Clinical Pharmacist Pager: [phone] 08/02/2012 at 2:51 PM

## 2012-08-02 NOTE — Evaluation (Signed)
Physical Therapy Evaluation Patient Details Name: [name] MRN: [id number] DOB: [birth year] Today's Date: 08/02/2012 Time: [id number] PT Time Calculation (min): 38 min  PT Assessment / Plan / Recommendation Clinical Impression  pt presents with R BKA.  pt from SNF with hx of L BKA and CVA.  pt at first stating she was able to perform lateral slide transfers to W/C with A, later states staff use a Harrel Lemon for all OOB activities.  pt seems to be near baseline and will need return to SNF.  Will sign off, no acute PT needs at this time.      PT Assessment  Patent does not need any further PT services    Follow Up Recommendations  SNF    Does the patient have the potential to tolerate intense rehabilitation      Barriers to Discharge        Equipment Recommendations  None recommended by PT    Recommendations for Other Services     Frequency      Precautions / Restrictions Precautions Precautions: Fall Restrictions Weight Bearing Restrictions: No   Pertinent Vitals/Pain Denies pain.        Mobility  Bed Mobility Bed Mobility: Rolling Right;Rolling Left;Supine to Sit;Sitting - Scoot to [employer] of Bed;Sit to Supine Rolling Right: 1: +2 Total assist Rolling Right: Patient Percentage: 10% Rolling Left: 1: +2 Total assist Rolling Left: Patient Percentage: 20% Supine to Sit: 1: +2 Total assist Supine to Sit: Patient Percentage: 20% Sitting - Scoot to Edge of Bed: 1: +2 Total assist Sitting - Scoot to Edge of Bed: Patient Percentage: 30% Sit to Supine: 1: +2 Total assist Sit to Supine: Patient Percentage: 10% Details for Bed Mobility Assistance: cues for safe technique throughout mobility.  pt needs reorienting to task at times.   Transfers Transfers: Not assessed Ambulation/Gait Ambulation/Gait Assistance: Not tested (comment) Stairs: No Wheelchair Mobility Wheelchair Mobility: No    Exercises     PT Diagnosis:    PT Problem List:   PT Treatment Interventions:      PT Goals    Visit Information  Last PT Received On: 08/02/12 Assistance Needed: +2 PT/OT Co-Evaluation/Treatment: Yes    Subjective Data  Subjective: They use a lift mostly.   Patient Stated Goal: None stated.     Prior Functioning  Home Living Available Help at Discharge: [place] Type of Home: [place] Additional Comments: pt notes she's from a facility, but doesn't remember the name of it.   Prior Function Level of Independence: Needs assistance Needs Assistance: Bathing;Dressing;Feeding;Grooming;Toileting;Meal Prep;Light Housekeeping;Gait;Transfers Bath: Total Dressing: Total Feeding: Moderate Grooming: Moderate Toileting: Total Meal Prep: Total Light Housekeeping: Total Gait Assistance: Nonambulatory Transfer Assistance: Per pt the staff at the facility use a lift to get her OOB.   Able to Take Stairs?: No Driving: No Vocation: On disability Communication Communication: No difficulties    Cognition  Cognition Overall Cognitive Status: Impaired Area of Impairment: Attention;Memory;Following commands;Safety/judgement;Awareness of errors;Awareness of deficits;Problem solving;Executive functioning Arousal/Alertness: Awake/alert Orientation Level: Disoriented to;Time Behavior During Session: [facility] for tasks performed Current Attention Level: Sustained Memory Deficits: Decreased recall of PLOF and orientation.   Following Commands: Follows one step commands inconsistently Safety/Judgement: Decreased safety judgement for tasks assessed;Decreased awareness of need for assistance Awareness of Errors: Assistance required to correct errors made    Extremity/Trunk Assessment Right Lower Extremity Assessment RLE ROM/Strength/Tone: Deficits RLE ROM/Strength/Tone Deficits: New BKA RLE Sensation: WFL - Light Touch Left Lower Extremity Assessment LLE ROM/Strength/Tone: Deficits LLE  ROM/Strength/Tone Deficits: Old BKA, unclear how long ago.    LLE Sensation: WFL - Light Touch Trunk Assessment Trunk Assessment: Kyphotic   Balance Balance Balance Assessed: Yes Static Sitting Balance Static Sitting - Balance Support: Left upper extremity supported;Feet unsupported Static Sitting - Level of Assistance: 5: Stand by assistance  End of Session PT - End of Session Activity Tolerance: Patient tolerated treatment well Patient left: in bed;with call bell/phone within reach;with bed alarm set Nurse Communication: Mobility status  GP     Catarina Hartshorn, Linnell Camp 08/02/2012, 1:11 PM

## 2012-08-02 NOTE — Evaluation (Signed)
Occupational Therapy Evaluation Patient Details Name: [name] MRN: [id number] DOB: 02/27/1961 Today's Date: 08/02/2012 Time: [id number] OT Time Calculation (min): 38 min  OT Assessment / Plan / Recommendation Clinical Impression  Pt s/p R BKA. pt from SNF with hx of L BKA and CVA.  At start of session, pt reporting she transferred using lateral scoot with assist at facility.  Later in session, pt stated that SNF staff almost always use a hoyer lift for OOB transfer.  Pt is total assist with ADLs at baseline.  Pt has no acute OT needs at this time. Will defer further OT services to next venue of care.    OT Assessment  All further OT needs can be met in the next venue of care    Follow Up Recommendations  SNF;Supervision/Assistance - 24 hour    Barriers to Discharge      Equipment Recommendations  None recommended by OT    Recommendations for Other Services    Frequency       Precautions / Restrictions Precautions Precautions: Fall Restrictions Weight Bearing Restrictions: No   Pertinent Vitals/Pain See vitals   ADL  Eating/Feeding: Performed;Set up Where Assessed - Eating/Feeding: Bed level Upper Body Bathing: Simulated;+1 Total assistance Where Assessed - Upper Body Bathing: Supine, head of bed up Lower Body Bathing: Simulated;+1 Total assistance Where Assessed - Lower Body Bathing: Supine, head of bed up Upper Body Dressing: Performed;+1 Total assistance Where Assessed - Upper Body Dressing: Supine, head of bed up Lower Body Dressing: Simulated;+1 Total assistance Where Assessed - Lower Body Dressing: Supine, head of bed up Toileting - Clothing Manipulation and Hygiene: Performed;+2 Total assistance Where Assessed - Toileting Clothing Manipulation and Hygiene: Supine, head of bed flat;Rolling right and/or left Transfers/Ambulation Related to ADLs: Pt with urinary incontinence while sitting EOB. Returned to supine to assist with peri care and placing clean draw pads  beneath pt. ADL Comments: Pt at baseline    OT Diagnosis:    OT Problem List:   OT Treatment Interventions:     OT Goals    Visit Information  Last OT Received On: 08/02/12 Assistance Needed: +2 PT/OT Co-Evaluation/Treatment: Yes    Subjective Data      Prior Functioning     Home Living Available Help at Discharge: [place] Type of Home: Skilled Nursing Facility Additional Comments: pt notes she's from a facility, but doesn't remember the name of it.   Prior Function Level of Independence: Needs assistance Needs Assistance: Bathing;Dressing;Feeding;Grooming;Toileting;Meal Prep;Light Housekeeping;Gait;Transfers Bath: Total Dressing: Total Feeding: Moderate Grooming: Moderate Toileting: Total Meal Prep: Total Light Housekeeping: Total Gait Assistance: Nonambulatory Transfer Assistance: Per pt the staff at the facility use a lift to get her OOB.   Able to Take Stairs?: No Driving: No Vocation: On disability Communication Communication: No difficulties Dominant Hand: Right         Vision/Perception Vision - Assessment Additional Comments: Pt has poor vision at baseline but unable to participate in visual assessment due to cognition/attention.  Seems to have difficulty scanning environment to locate objects.   Cognition  Cognition Overall Cognitive Status: Impaired Area of Impairment: Attention;Memory;Following commands;Safety/judgement;Awareness of errors;Awareness of deficits;Problem solving;Executive functioning Arousal/Alertness: Awake/alert Orientation Level: Disoriented to;Time Behavior During Session: [facility] for tasks performed Current Attention Level: Sustained Memory Deficits: Decreased recall of PLOF and orientation.   Following Commands: Follows one step commands inconsistently Safety/Judgement: Decreased safety judgement for tasks assessed;Decreased awareness of need for assistance Awareness of Errors: Assistance required to correct  errors made  Extremity/Trunk Assessment Right Upper Extremity Assessment RUE ROM/Strength/Tone: Deficits RUE ROM/Strength/Tone Deficits: 3+/5 throughout Left Upper Extremity Assessment LUE ROM/Strength/Tone: Deficits LUE ROM/Strength/Tone Deficits: Baseline weakness due to old CVA- no AROM noted.  Hand and wrist in flexion.  Pt reports she wears a splint at her facility during the day.  Placed a rolled washcloth in hand to prevent skin breakdown and improve hand hygiene. Right Lower Extremity Assessment RLE ROM/Strength/Tone: Deficits RLE ROM/Strength/Tone Deficits: New BKA RLE Sensation: WFL - Light Touch Left Lower Extremity Assessment LLE ROM/Strength/Tone: Deficits LLE ROM/Strength/Tone Deficits: Old BKA, unclear how long ago.   LLE Sensation: WFL - Light Touch Trunk Assessment Trunk Assessment: Kyphotic     Mobility Bed Mobility Bed Mobility: Rolling Right;Rolling Left;Supine to Sit;Sitting - Scoot to [employer] of Bed;Sit to Supine Rolling Right: 1: +2 Total assist Rolling Right: Patient Percentage: 10% Rolling Left: 1: +2 Total assist Rolling Left: Patient Percentage: 20% Supine to Sit: 1: +2 Total assist Supine to Sit: Patient Percentage: 20% Sitting - Scoot to Edge of Bed: 1: +2 Total assist Sitting - Scoot to Edge of Bed: Patient Percentage: 30% Sit to Supine: 1: +2 Total assist Sit to Supine: Patient Percentage: 10% Details for Bed Mobility Assistance: cues for safe technique throughout mobility.  pt needs reorienting to task at times.       Exercise     Balance Balance Balance Assessed: Yes Static Sitting Balance Static Sitting - Balance Support: Feet unsupported;Right upper extremity supported Static Sitting - Level of Assistance: 5: Stand by assistance   End of Session OT - End of Session Activity Tolerance: Patient limited by fatigue Patient left: in bed;with call bell/phone within reach Nurse Communication: Mobility status;Other (comment) (place washcloth in  left hand to maintain skin integrity )  GO    08/02/2012 Darrol Jump OTR/L Pager [phone] Office [phone]  Darrol Jump 08/02/2012, 2:45 PM

## 2012-08-03 LAB — CBC
MCH: 29.8 pg (ref 26.0–34.0)
MCV: 91.2 fL (ref 78.0–100.0)
Platelets: 391 10*3/uL (ref 150–400)
RBC: 2.95 MIL/uL — ABNORMAL LOW (ref 3.87–5.11)
RDW: 13.9 % (ref 11.5–15.5)
WBC: 9.8 10*3/uL (ref 4.0–10.5)

## 2012-08-03 LAB — PROTIME-INR: Prothrombin Time: 18.3 seconds — ABNORMAL HIGH (ref 11.6–15.2)

## 2012-08-03 LAB — GLUCOSE, CAPILLARY: Glucose-Capillary: 136 mg/dL — ABNORMAL HIGH (ref 70–99)

## 2012-08-03 MED ORDER — WARFARIN SODIUM 7.5 MG PO TABS
7.5000 mg | ORAL_TABLET | Freq: Once | ORAL | Status: AC
  Administered 2012-08-03: 7.5 mg via ORAL
  Filled 2012-08-03: qty 1

## 2012-08-03 NOTE — Plan of Care (Signed)
Problem: Phase II Progression Outcomes Goal: Transfers with moderate assist Outcome: Adequate for Discharge At baseline

## 2012-08-03 NOTE — Progress Notes (Signed)
Patient ID: [name], female   DOB: Aug 16, 1960, 52 y.o.   MRN: [id number] Postoperative day 2 right transtibial amputation. Orders written for social worker consult for discharge to short-term skilled nursing.

## 2012-08-03 NOTE — Consult Note (Addendum)
ANTICOAGULATION CONSULT NOTE - Follow Up Consult  Pharmacy Consult for Coumadin Indication: VTE prophylaxis/  Chronic coumadin PTA -  medical history including PVD, Cardiomyopathy, CVA.  Allergies Allergies  Allergen Reactions  . Ace Inhibitors Shortness Of Breath  . Latex Hives  . Lisinopril Other (See Comments)    Per MAR  . Vancomycin Other (See Comments)    Per [facility]    Patient Measurements: Height: 5\' 7"  (170.2 cm) Weight: 220 lb (99.791 kg) IBW/kg (Calculated) : 61.6  Vital Signs: BP 137/81  Pulse 96  Temp(Src) 98.3 F (36.8 C) (Oral)  Resp 18  Ht 5\' 7"  (1.702 m)  Wt 220 lb (99.791 kg)  BMI 34.45 kg/m2  SpO2 98%  Labs:  Recent Labs  08/01/12 1300 08/02/12 0630 08/03/12 0625  HGB 10.4* 9.9* 8.8*  HCT 30.8* 29.9* 26.9*  PLT 445* 453* 391  APTT 49*  --   --   LABPROT 14.1 16.3* 18.3*  INR 1.10 1.34 1.57*  CREATININE 1.45*  --   --    Estimated Creatinine Clearance: 57 ml/min (by C-G formula based on Cr of 1.45).  Medications:  Prescriptions prior to admission  Medication Sig Dispense Refill  . acetaminophen (TYLENOL) 325 MG tablet Take 650 mg by mouth every 4 (four) hours as needed. For pain      . ALPRAZolam (XANAX) 0.25 MG tablet Take 0.25 mg by mouth 3 (three) times daily as needed. For anxiety      . Alum & Mag Hydroxide-Simeth (MAALOX PLUS PO) Take 30 mLs by mouth 4 (four) times daily as needed. For indigestion.      Marland Kitchen amLODipine (NORVASC) 5 MG tablet Take 5 mg by mouth daily.      Marland Kitchen antiseptic oral rinse (BIOTENE) LIQD 15 mLs by Mouth Rinse route 2 (two) times daily.      . chlorhexidine (PERIDEX) 0.12 % solution Use as directed 15 mLs in the mouth or throat 2 (two) times daily.      . cloNIDine (CATAPRES) 0.1 MG tablet Take 0.1 mg by mouth every 8 (eight) hours as needed. For blood pressure SBP>OR = 180      . cycloSPORINE (RESTASIS) 0.05 % ophthalmic emulsion Place 1 drop into both eyes 2 (two) times daily.       . feeding supplement (PRO-STAT  SUGAR FREE 64) LIQD Take 30 mLs by mouth 3 (three) times daily with meals.  900 mL  1  . ferrous sulfate 325 (65 FE) MG tablet Take 325 mg by mouth 2 (two) times daily.       Marland Kitchen gabapentin (NEURONTIN) 300 MG capsule Take 600 mg by mouth 4 (four) times daily.      Marland Kitchen HYDROcodone-acetaminophen (NORCO) 5-325 MG per tablet Take 2 tablets by mouth every 6 (six) hours as needed for pain. For pain      . insulin aspart (NOVOLOG) 100 UNIT/ML injection Inject 5 Units into the skin 4 (four) times daily -  before meals and at bedtime. 5 units for blood sugar>150      . methylphenidate (RITALIN) 10 MG tablet Take 5 mg by mouth 2 (two) times daily.       . metoprolol (LOPRESSOR) 100 MG tablet Take 100 mg by mouth 2 (two) times daily.      . Multiple Vitamins-Minerals (MULTIVITAMINS THER. W/MINERALS) TABS Take 2 tablets by mouth daily.      . phenytoin (DILANTIN) 100 MG ER capsule Take 300 mg by mouth at bedtime.      Marland Kitchen  pravastatin (PRAVACHOL) 40 MG tablet Take 40 mg by mouth at bedtime.      . sertraline (ZOLOFT) 100 MG tablet Take 100 mg by mouth daily.      Marland Kitchen warfarin (COUMADIN) 5 MG tablet Take 5 mg by mouth daily.      . silver sulfADIAZINE (SILVADENE) 1 % cream Apply 1 application topically daily as needed (to wound).        Assessment:  INR is 1.57 POD #2 1 s/p R-BKA due to gangrene right foot in this 52 y/o female with a complicated medical history including diabetes, PVD, Cardiomyopathy, CVA, and on chronic Coumadin PTA.  Failure  conservative treatment with osteomyelitis abscess ulceration right lower extremity.   PTA dose of coumadin was 5mg  daily with dose held prior to admit for surgery.   H/H decreasing slowly, Hgb 8.8 today , pltc 391 stable. No bleeding noted.    Goal of Therapy:  Target INR 2-3    Plan:  Coumadin 7.5 mg today.  Daily INR's, CBC.  Monitor for bleeding complications  Nicole Cella, RPh Clinical Pharmacist Pager: [phone] 08/03/2012 at 2:51 PM

## 2012-08-03 NOTE — Clinical Social Work Psychosocial (Signed)
Clinical Social Work Department BRIEF PSYCHOSOCIAL ASSESSMENT 08/03/2012  Patient:  [name], [name]     Account Number:  [id number]     Admit date:  08/01/2012  Clinical Social Worker:  Robbi Garter  Date/Time:  08/03/2012 05:30 PM  Referred by:  Physician  Date Referred:  08/03/2012 Referred for  SNF Placement   Other Referral:   Interview type:  Patient Other interview type:    PSYCHOSOCIAL DATA Living Status:  FACILITY Admitted from facility:  [place] Level of care:  [place] Primary support name:  Mackie Pai Primary support relationship to patient:  [place] Degree of support available:   Adequate    CURRENT CONCERNS Current Concerns  Post-Acute Placement   Other Concerns:    SOCIAL WORK ASSESSMENT / PLAN CSW met with pt re: d/c plan.  Pt reports admitted from [facility]) and plan is for her to return at d/c.  Weekday CSW to f/u with SNF to confirm pt able to return.  FL2 complete and on pt's chart.   Assessment/plan status:  Information/Referral to [employer] Other assessment/ plan:   Information/referral to community resources:   SNF  PTAR    PATIENT'S/FAMILY'S RESPONSE TO PLAN OF CARE: Pt reports agreeable to return to SNF at d/c.  Pt verbalized appreciation for CSW visit and assist.    Wandra Feinstein, MSW, [place] (Weekends 8:00am-4:30pm)

## 2012-08-04 ENCOUNTER — Encounter (HOSPITAL_COMMUNITY): Payer: Self-pay | Admitting: Orthopedic Surgery

## 2012-08-04 LAB — CBC
HCT: 27.7 % — ABNORMAL LOW (ref 36.0–46.0)
MCH: 30 pg (ref 26.0–34.0)
MCV: 92.3 fL (ref 78.0–100.0)
RBC: 3 MIL/uL — ABNORMAL LOW (ref 3.87–5.11)
WBC: 9.2 10*3/uL (ref 4.0–10.5)

## 2012-08-04 LAB — GLUCOSE, CAPILLARY: Glucose-Capillary: 80 mg/dL (ref 70–99)

## 2012-08-04 MED ORDER — OXYCODONE-ACETAMINOPHEN 5-325 MG PO TABS: 1.0000 | ORAL_TABLET | ORAL | Status: DC | PRN

## 2012-08-04 NOTE — Discharge Summary (Signed)
Physician Discharge Summary  Patient ID: [name] MRN: [id number] DOB/AGE: 50-Jul-1962 50 y.o.  Admit date: 08/01/2012 Discharge date: 08/04/2012  Admission Diagnoses: Right lower extremity gangrene  Discharge Diagnoses: Right lower extremity gangrene Active Problems:   * No active hospital problems. *   Discharged Condition: stable  Hospital Course: Patient is a 52 year old woman with diabetic neuropathy peripheral vascular disease who developed ulceration abscess ostomy light right lower extremity she failed conservative treatment and presented at this time for surgical intervention she underwent a transtibial amputation. Postoperatively patient required additional assistance for transfers and is discharged to short-term skilled nursing.  Consults: None  Significant Diagnostic Studies: labs: Routine labs  Treatments: surgery: See operative note  Discharge Exam: Blood pressure 131/88, pulse 87, temperature 98.3 F (36.8 C), temperature source Oral, resp. rate 18, height 5\' 7"  (1.702 m), weight 99.791 kg (220 lb), SpO2 99.00%. Incision/Wound: dressing clean dry and intact at time of discharge  Disposition: 03-Skilled Nursing Facility     Medication List    ASK your doctor about these medications       acetaminophen 325 MG tablet  Commonly known as:  TYLENOL  Take 650 mg by mouth every 4 (four) hours as needed. For pain     ALPRAZolam 0.25 MG tablet  Commonly known as:  XANAX  Take 0.25 mg by mouth 3 (three) times daily as needed. For anxiety     amLODipine 5 MG tablet  Commonly known as:  NORVASC  Take 5 mg by mouth daily.     antiseptic oral rinse Liqd  15 mLs by Mouth Rinse route 2 (two) times daily.     chlorhexidine 0.12 % solution  Commonly known as:  PERIDEX  Use as directed 15 mLs in the mouth or throat 2 (two) times daily.     cloNIDine 0.1 MG tablet  Commonly known as:  CATAPRES  Take 0.1 mg by mouth every 8 (eight) hours as needed. For blood  pressure SBP>OR = 180     cycloSPORINE 0.05 % ophthalmic emulsion  Commonly known as:  RESTASIS  Place 1 drop into both eyes 2 (two) times daily.     feeding supplement Liqd  Take 30 mLs by mouth 3 (three) times daily with meals.     ferrous sulfate 325 (65 FE) MG tablet  Take 325 mg by mouth 2 (two) times daily.     gabapentin 300 MG capsule  Commonly known as:  NEURONTIN  Take 600 mg by mouth 4 (four) times daily.     HYDROcodone-acetaminophen 5-325 MG per tablet  Commonly known as:  NORCO/VICODIN  Take 2 tablets by mouth every 6 (six) hours as needed for pain. For pain     insulin aspart 100 UNIT/ML injection  Commonly known as:  novoLOG  Inject 5 Units into the skin 4 (four) times daily -  before meals and at bedtime. 5 units for blood sugar>150     MAALOX PLUS PO  Take 30 mLs by mouth 4 (four) times daily as needed. For indigestion.     methylphenidate 10 MG tablet  Commonly known as:  RITALIN  Take 5 mg by mouth 2 (two) times daily.     metoprolol 100 MG tablet  Commonly known as:  LOPRESSOR  Take 100 mg by mouth 2 (two) times daily.     multivitamins ther. w/minerals Tabs  Take 2 tablets by mouth daily.     phenytoin 100 MG ER capsule  Commonly known as:  DILANTIN  Take 300 mg by mouth at bedtime.     pravastatin 40 MG tablet  Commonly known as:  PRAVACHOL  Take 40 mg by mouth at bedtime.     sertraline 100 MG tablet  Commonly known as:  ZOLOFT  Take 100 mg by mouth daily.     silver sulfADIAZINE 1 % cream  Commonly known as:  SILVADENE  Apply 1 application topically daily as needed (to wound).     warfarin 5 MG tablet  Commonly known as:  COUMADIN  Take 5 mg by mouth daily.           Follow-up Information   Follow up with Sherry Reta V, MD In 3 weeks.   Contact information:   [place] [address] [phone]       Signed: Newt Minion 08/04/2012, 6:32 AM

## 2012-08-04 NOTE — Progress Notes (Signed)
Pt discharged to SNF. Report called to Roselyn Reef, RN at [facility]. Pts IV was removed. Pt left unit in a stable condition via EMS.

## 2012-08-04 NOTE — Progress Notes (Signed)
Clinical social worker assisted with patient discharge to skilled nursing facility, [facility] in [place].  CSW addressed all family questions and concerns. CSW copied chart and added all important documents. CSW also set up patient transportation with Diplomatic Services operational officer. Clinical Social Worker will sign off for now as social work intervention is no longer needed.   Rhea Pink, MSW, [place]

## 2013-01-14 ENCOUNTER — Encounter: Payer: Self-pay | Admitting: Neurology

## 2013-01-14 ENCOUNTER — Ambulatory Visit (INDEPENDENT_AMBULATORY_CARE_PROVIDER_SITE_OTHER): Payer: Medicare Other | Admitting: Neurology

## 2013-01-14 VITALS — BP 146/84 | HR 80 | Temp 99.0°F

## 2013-01-14 DIAGNOSIS — I639 Cerebral infarction, unspecified: Secondary | ICD-10-CM

## 2013-01-14 DIAGNOSIS — R569 Unspecified convulsions: Secondary | ICD-10-CM

## 2013-01-14 DIAGNOSIS — I6350 Cerebral infarction due to unspecified occlusion or stenosis of unspecified cerebral artery: Secondary | ICD-10-CM

## 2013-01-14 DIAGNOSIS — G9340 Encephalopathy, unspecified: Secondary | ICD-10-CM

## 2013-01-14 LAB — CBC WITH DIFFERENTIAL/PLATELET
Basophils Relative: 0.7 % (ref 0.0–3.0)
Eosinophils Relative: 5.6 % — ABNORMAL HIGH (ref 0.0–5.0)
Hemoglobin: 8.9 g/dL — ABNORMAL LOW (ref 12.0–15.0)
Lymphocytes Relative: 19 % (ref 12.0–46.0)
MCHC: 32.7 g/dL (ref 30.0–36.0)
Monocytes Relative: 12.3 % — ABNORMAL HIGH (ref 3.0–12.0)
Neutro Abs: 4.2 10*3/uL (ref 1.4–7.7)
Neutrophils Relative %: 62.4 % (ref 43.0–77.0)
RBC: 2.87 Mil/uL — ABNORMAL LOW (ref 3.87–5.11)
WBC: 6.7 10*3/uL (ref 4.5–10.5)

## 2013-01-14 LAB — COMPREHENSIVE METABOLIC PANEL
AST: 23 U/L (ref 0–37)
BUN: 40 mg/dL — ABNORMAL HIGH (ref 6–23)
Calcium: 8.1 mg/dL — ABNORMAL LOW (ref 8.4–10.5)
Chloride: 110 mEq/L (ref 96–112)
Creatinine, Ser: 2.3 mg/dL — ABNORMAL HIGH (ref 0.4–1.2)
GFR: 29.27 mL/min — ABNORMAL LOW (ref >60.00)
Glucose, Bld: 152 mg/dL — ABNORMAL HIGH (ref 70–99)

## 2013-01-14 NOTE — Progress Notes (Addendum)
NEUROLOGY CONSULTATION NOTE  [name] MRN: [id number] DOB: 07/05/1960  Referring provider: Dr. Laverta Baltimore Primary care provider: Dr. Laverta Baltimore  Reason for consult:  Cognitive decline.  HISTORY OF PRESENT ILLNESS: [name] is a 52 year old female with history of type II diabetes, anxiety, right MCA stroke, cardiomyopathy and mural thrombus on [facility], who presents for evaluation of cognitive deficits and seizure.  Records and images personally reviewed where available.  She has residual left-sided plegia from a cardio-embolic stroke sustained in November 2012.  She has bilateral BTK amputations, due to osteomyelitis as a complication of diabetes.   Patient is a poor historian, but she was able to offer me some insight.  She really didn't know why she is here.  Unfortunately, no records were faxed to me at time of visit, and she was not accompanied by anyone who knows her history.  I called the nursing facility and spoke to a RN.  Apparently over the past couple of weeks or so, she has had a decline in mentation.  She is not as talkative as usual, she has suppressed appetite, and has been unable to sleep.  She has been described as "not as alert" as usual.  She will not remember what she ate or what medications she took.  A psychiatry note that she is minimally participatory, minimally interactive, and sad appearing.  No reported hallucinations.  No abnormal movements.  She reportedly has been on phenytoin for the past 6 months or so.  She apparently had a seizure at that time.  She had another reported seizure on 11/11/12, reportedly lasting 30 seconds.  She was not on AEDs prior to that, and has not had any prior seizures since her stay at the SNF.  However, her sister reported that she did have some seizures in the past.  She has had difficulty swallowing, and a recent swallow study revealed some dysfunction.  She is currently on a puree diet.  She was diagnosed with a UTI a couple of weeks ago, and  currently is still on antibiotics (Cipro).  Patient reports feeling increased anxiety and depression, especially since her brother passed away last 09/18/2022.  She became tearful when telling me this.  She notes feeling more "jittery" and anxious.  She does take Zoloft for depression and Xanax for anxiety.  For insomnia, she was prescribed Ambien, but still notes difficulty sleeping.  As per the RN on the phone, she apparently had been followed by a neurologist in [place] in the past, but her sister forgot his name.  She has significant diabetic neuropathic pain, and takes gabapentin, as well as Percocet.  Recent labs (01/02/13): WBC 4.6, Hgb 10.7, Hct 31.8, PLT 401, INR 5.22, Na 138, K 5.1, Cl 110, CO2 22, glucose 92, BUN 38, Cr 1.39, Ca 8.1, TSH 3.650, B12 1627, folate 9.8                      (01/01/13): UA turbid, protein > 300, nitrate negative, LE large, WBC >50, bacteria many  MRI Brain (04/23/11): acute large right MCA stroke, atrophy and small vessel disease. MRA Head (04/23/11):  reduced number of M3 branches in right MCA, mild atherosclerotic changes of distal MCA and PCA branches. 2D Echo (04/23/11):  LVEF 30-35%, hypokinetic LDL (06/13/10): 213 Hgb A1c (10/19/11):  8.4  PAST MEDICAL HISTORY: Past Medical History  Diagnosis Date  . HTN (hypertension)   . HLD (hyperlipidemia)   . History of methicillin resistant staphylococcus aureus (  MRSA)   . CVA (cerebral infarction)   . Anemia   . Tachycardia   . Obesity   . Lower back pain     Chronic  . Idiopathic peripheral neuropathy     NOS  . DJD (degenerative joint disease)   . Headache(784.0)   . GERD (gastroesophageal reflux disease)   . Depression   . Anxiety   . Hypokalemia     resolved  . Cerebral infarct     left parietal and bilateral  . Esophagitis     distal  . Fluid overload     compensated  . Urinary retention   . H/O: pneumonia   . History of bacteremia   . Surgery, other elective     of the ear  . Stroke      Hx of left frontoparietal stroke in 12/11. History of right brain stroke.  . Cardiomyopathy     Echocardiogram 04/23/11: Mild LVH, EF 30-35%, mild MR, mild LAE, mild-moderate RVE.  TEE 04/25/11: Moderate LVH, severe biventricular dysfunction, EF 20-25%, moderate to severe MR, moderate TR, biatrial enlargement, no PFO, negative bubble study  . Shortness of breath     with exertion   . Sleep apnea     no machine used  pt does not remember test for sleep apnea  . DM type 2 (diabetes mellitus, type 2)     type two   oral meds only   . 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 UTI (lower urinary tract infection) 10/19/11    had approx April 2013  . Osteomyelitis   . DJD (degenerative joint disease)   . Urinary retention   . Pneumonia     PAST SURGICAL HISTORY: Past Surgical History  Procedure Laterality Date  . Tympanoplasty    . Toe amputation      left 2nd toe  . Dilation and curettage of uterus      history  . Tee without cardioversion  04/25/2011    Procedure: TRANSESOPHAGEAL ECHOCARDIOGRAM (TEE);  Surgeon: Jolaine Artist, MD;  Location: [facility] ENDOSCOPY;  Service: Cardiovascular;  Laterality: N/A;  . Esophagogastroduodenoscopy  08/14/2011    Procedure: ESOPHAGOGASTRODUODENOSCOPY (EGD);  Surgeon: Scarlette Shorts, MD;  Location: [facility] ENDOSCOPY;  Service: Endoscopy;  Laterality: N/A;  . Amputation  01/18/2012    Procedure: AMPUTATION BELOW KNEE;  Surgeon: Newt Minion, MD;  Location: [place];  Service: Orthopedics;  Laterality: Left;  Left Below Knee Amputation  . Amputation Right 08/01/2012    Procedure: Right Below Knee Amputation;  Surgeon: Newt Minion, MD;  Location: [place];  Service: Orthopedics;  Laterality: Right;  Right Below Knee Amputation    MEDICATIONS: Current Outpatient Prescriptions on File Prior to Visit  Medication Sig Dispense Refill  . acetaminophen (TYLENOL) 325 MG tablet Take 650 mg by mouth every 4 (four) hours as needed. For pain      . ALPRAZolam (XANAX) 0.25  MG tablet Take 0.25 mg by mouth 3 (three) times daily as needed. For anxiety      . Alum & Mag Hydroxide-Simeth (MAALOX PLUS PO) Take 30 mLs by mouth 4 (four) times daily as needed. For indigestion.      Marland Kitchen amLODipine (NORVASC) 5 MG tablet Take 5 mg by mouth daily.      Marland Kitchen antiseptic oral rinse (BIOTENE) LIQD 15 mLs by Mouth Rinse route 2 (two) times daily.      . chlorhexidine (PERIDEX) 0.12 % solution Use  as directed 15 mLs in the mouth or throat 2 (two) times daily.      . cloNIDine (CATAPRES) 0.1 MG tablet Take 0.1 mg by mouth every 8 (eight) hours as needed. For blood pressure SBP>OR = 180      . cycloSPORINE (RESTASIS) 0.05 % ophthalmic emulsion Place 1 drop into both eyes 2 (two) times daily.       . feeding supplement (PRO-STAT SUGAR FREE 64) LIQD Take 30 mLs by mouth 3 (three) times daily with meals.  900 mL  1  . ferrous sulfate 325 (65 FE) MG tablet Take 325 mg by mouth 2 (two) times daily.       Marland Kitchen gabapentin (NEURONTIN) 300 MG capsule Take 600 mg by mouth 4 (four) times daily.      Marland Kitchen HYDROcodone-acetaminophen (NORCO) 5-325 MG per tablet Take 2 tablets by mouth every 6 (six) hours as needed for pain. For pain      . insulin aspart (NOVOLOG) 100 UNIT/ML injection Inject 5 Units into the skin 4 (four) times daily -  before meals and at bedtime. 5 units for blood sugar>150      . methylphenidate (RITALIN) 10 MG tablet Take 5 mg by mouth 2 (two) times daily.       . metoprolol (LOPRESSOR) 100 MG tablet Take 100 mg by mouth 2 (two) times daily.      . Multiple Vitamins-Minerals (MULTIVITAMINS THER. W/MINERALS) TABS Take 2 tablets by mouth daily.      Marland Kitchen oxyCODONE-acetaminophen (ROXICET) 5-325 MG per tablet Take 1 tablet by mouth every 4 (four) hours as needed for pain.  60 tablet  0  . phenytoin (DILANTIN) 100 MG ER capsule Take 300 mg by mouth at bedtime.      . pravastatin (PRAVACHOL) 40 MG tablet Take 40 mg by mouth at bedtime.      . sertraline (ZOLOFT) 100 MG tablet Take 100 mg by mouth  daily.      . silver sulfADIAZINE (SILVADENE) 1 % cream Apply 1 application topically daily as needed (to wound).      . warfarin (COUMADIN) 5 MG tablet Take 5 mg by mouth daily.      . [DISCONTINUED] furosemide (LASIX) 20 MG tablet Take 20 mg by mouth daily.        . [DISCONTINUED] potassium chloride SA (K-DUR,KLOR-CON) 20 MEQ tablet Take 20 mEq by mouth daily.      . [DISCONTINUED] promethazine (PHENERGAN) 25 MG/ML injection Inject 25 mg into the muscle every 6 (six) hours as needed. For nausea/vomiting       No current facility-administered medications on file prior to visit.    ALLERGIES: Allergies  Allergen Reactions  . Ace Inhibitors Shortness Of Breath  . Latex Hives  . Lisinopril Other (See Comments)    Per MAR  . Vancomycin Other (See Comments)    Per MAR    FAMILY HISTORY: Family History  Problem Relation Age of Onset  . Coronary artery disease    . Diabetes    . Cancer Mother   . Diabetes Mother   . Heart disease Father   . Hyperlipidemia Father   . Hypertension Father   . Stroke Father   . Heart disease Brother   . Anesthesia problems Neg Hx   . Hypotension Neg Hx   . Malignant hyperthermia Neg Hx   . Pseudochol deficiency Neg Hx     SOCIAL HISTORY: History   Social History  . Marital Status: Divorced  Spouse Name: N/A    Number of Children: N/A  . Years of Education: N/A   Occupational History  . Not on file.   Social History Main Topics  . Smoking status: Never Smoker   . Smokeless tobacco: Never Used  . Alcohol Use: Yes     Comment: rare on holidays  . Drug Use: No  . Sexually Active: Not Currently   Other Topics Concern  . Not on file   Social History Narrative   Divorced   One of 10 children   5 in [place] and was living with sister at time of CVA   Nonsmoker   Nondrinker    REVIEW OF SYSTEMS: Constitutional: No fevers, chills, or sweats, no generalized fatigue, change in appetite Eyes: No visual changes, double vision, eye  pain Ear, nose and throat: No hearing loss, ear pain, nasal congestion, sore throat Cardiovascular: No chest pain, palpitations Respiratory:  No shortness of breath at rest or with exertion, wheezes GastrointestinaI: No nausea, vomiting, diarrhea, abdominal pain, fecal incontinence Genitourinary:  No dysuria, urinary retention or frequency Musculoskeletal:  No neck pain, back pain Integumentary: No rash, pruritus, skin lesions Neurological: as above Psychiatric: Positive for depression, insomnia, anxiety Endocrine: Fatigue Hematologic/Lymphatic:  No anemia, purpura, petechiae. Allergic/Immunologic: no itchy/runny eyes, nasal congestion, recent allergic reactions, rashes  PHYSICAL EXAM: Filed Vitals:   01/14/13 1023  BP: 146/84  Pulse: 80  Temp: 99 F (37.2 C)   General: No acute distress, depressed mood, at times tearful. Head:  Normocephalic/atraumatic Neck: supple, no paraspinal tenderness, full range of motion Back: No paraspinal tenderness Heart: regular rate and rhythm Lungs: Clear to auscultation bilaterally. Vascular: No carotid bruits. Neurological Exam: Mental status: alert and oriented to person, place (knew she was seeing neurologist but thought she was in [facility]), time (month and year), speech fluent with mild dysarthric, language intact. Cranial nerves: CN I: not tested CN II: pupils equal, round and reactive to light, possible left field cut, fundi not visualized. CN III, IV, VI:  full range of motion, no nystagmus, no ptosis CN V: facial sensation intact CN VII: left upper and lower facial weakness CN VIII: hearing intact CN IX, X: gag intact, uvula midline CN XI: sternocleidomastoid and trapezius muscles intact CN XII: tongue midline Bulk & Tone: left upper extremity spastic Motor: LUE plegic, RUE 5/5, LEs 4/5 hip flexion.  At times her right arm would shake, but she could voluntarily stop it. Sensation: temperature and vibration intact in upper  extremities and at the knees Deep Tendon Reflexes: 2+ throughout except 3+ LUE Finger to nose testing: without dysmetria on the right Gait: bilateral BTK amputee. Romberg NR.  IMPRESSION & PLAN: [name] is a 52 y.o. female with history of large right MCA infarct and seizures who presents with decreased cognitive functioning.  I think most of this is secondary to depression, but complicated by the UTI, dehydration and possible underlying vascular dementia.  There doesn't really seem to be any evidence of encephalopathy and she is able to answer appropriately and is oriented.  She endorses increased anxiety and became tearful talking about how she started feeling down since her brother passed away.  I think we should still check for any new intracranial abnormalities, as well as an EEG. 1.  CT of brain 2.  EEG 3.  Recheck CBC w/diff and CMP.  Check phenytoin level. 4.  Continue antibiotics and fluids and monitor over the next couple of weeks. 5.  Address anxiety  and depression. 6.  Maintain proper diet and hydration. 7.  Continue phenytoin ER 300mg  qhs 8.  Limit use of narcotics. 9.  Follow up in 2 months.  60 minutes spent with patient, over 50% spent reviewing chart, speaking with RN from SNF on phone, counseling and coordinating care.  Thank you for allowing me to take part in the care of this patient.  Metta Clines, DO  CC:  Rogene Houston, MD

## 2013-01-14 NOTE — Patient Instructions (Addendum)
Possibly multifactorial, related to depression and encephalopathy from UTI. 1.  CT of brain 2.  Routine EEG. 3. Check CBC with diff, CMP, phenytoin level 4  See if alertness clears over next couple of weeks. 5.  Consider addressing anxiety and depression 6.  Limit use of narcotics. 7.  Follow up in 2 months.  Your CT scan is scheduled at [facility] on Tuesday, August 12th at 11:30 am. Please check in at 11:15 am at first floor radiology. Enter the hospital off of [address] at [address]. After the CT scan you will have an EEG also at [facility] at 1:00 pm.   Follow up here with Dr. Tomi Likens in 2 months. Please be sure the patient is accompanied by family at the next office visit.

## 2013-01-20 ENCOUNTER — Ambulatory Visit (HOSPITAL_COMMUNITY)
Admission: RE | Admit: 2013-01-20 | Discharge: 2013-01-20 | Disposition: A | Discharge status: Home / Self Care | Payer: Medicare Other | Source: Ambulatory Visit | Attending: Neurology | Admitting: Neurology

## 2013-01-20 DIAGNOSIS — R569 Unspecified convulsions: Secondary | ICD-10-CM

## 2013-01-20 DIAGNOSIS — G9349 Other encephalopathy: Secondary | ICD-10-CM

## 2013-01-20 DIAGNOSIS — G319 Degenerative disease of nervous system, unspecified: Secondary | ICD-10-CM | POA: Insufficient documentation

## 2013-01-20 DIAGNOSIS — G9340 Encephalopathy, unspecified: Secondary | ICD-10-CM

## 2013-01-22 ENCOUNTER — Other Ambulatory Visit: Payer: Self-pay | Admitting: Ophthalmology

## 2013-01-22 NOTE — H&P (Signed)
Patient Record  [name], [name]  Patient Number:  [id number] Date of Birth:  [birth year] Age:  52 years old    Gender:  Female Date of Evaluation:  January 22, 2013  Chief Complaint:   The patient has been lost to followup  for Proliferative Diabetic Retinopathy and DME OU. She has had bilateral BKA over the last two years as well as a stroke. She has not returened for follow up care because of same. She reports poor vision OU and desires evaluation. She did not complete PRP OS as was planned. She was evaluated on a stretcher from the ambulance. She is unable to transfer or sit up. History of Present Illness:   DM x over 10 years. Patient with Severe Diabetic Retinopathy has had PRP OU.  She complains of Photophobia and foreign body sensation along with eye pain OU. Past History:  Allergies:  vancomycin (questionable), Active Medications:   Ocular Medications:  Restasis OU QD Other Medications:  pravastatin (pravastatin) tablet 40 mg 1 tablet by mouth at bedtime as directed tablet, metoprolol ta-hydrochlorothiaz (metoprolol ta-hydrochlorothiaz) tablet 50-25 mg 1 tablet by mouth twice a day as directed tablet, metformin (metformin) tablet 1,000 mg 1 tablet by mouth twice a day with meals tablet, lisinopril (lisinopril) tablet 20 mg 1 tablet by mouth twice a day as directed tablet, hydrochlorothiazide (hydrochlorothiazide) capsule 12.5 mg 1 capsule by mouth once a day as directed capsule, gabapentin (gabapentin) tablet 600 mg 1 tablet by mouth four times a day as directed tablet, ferrous sulfate (ferrous sulfate) tablet 325 mg (65 mg iron) 1 tablet by mouth twice a day as directed tablet, amitriptyline (amitriptyline) tablet 50 mg 1 tablet by mouth once a day as needed tablet Birth History:  none Past Ocular History:   PDR, DME, Cataract Past Medical History:   DM x  10 years,  Hypertension,  Elevated Cholesterol, CVA x 05/2010 Cerebrovascular Accident with left side paresis Past Surgical History:   left ear surgery, hole in ear drum Bilateral BKA Family History:  no amblyopia, no blindness, no cataracts, no crossed eyes, no diabetic retinopathy, no glaucoma, no macular degeneration, no retinal detachment, + cancer (mother), + diabetes (mother, grandmother), + heart disease (father), + high blood pressure (father), no stroke Social History:  Smoking Status: never smoker   Alcohol:  none   Driving status:  not driving  Marital status:  single Review of Systems:   Constitutional:  no fever, no weight loss    Eyes: + decreased vision  Ear/Nose/Throat:  no hearing loss, no sinus problems    Cardiovascular:  + high blood pressure, + high cholesterol  Respiratory:  no shortness of breath, no wheezing    Gastrointestinal:  no abdominal pain, no nausea    Genitourinary:  no blood in urine, no discomfort    Musculoskeletal: + low back pain  Integumentary skin/breast:  no rashes, no skin tumors    Neurological: + numbness, + weakness   Psychiatric: + anxiety, + depression  Endocrine:  no heat intolerance, no thyroid problems    Hematologic/Lymphatic: + anemia   Allergic/Immunologic: + seasonal allergies Examination:  Visual Acuity:   [place]:  OD: 20/200 ph +3.00    OS: 20/200 ph +3.00    @ 14 inches IOP:  OD:  11, Perkins tonometer     OS:  11, Perkins tonometer    @ 05:10PM (Goldmann applanation)  Confrontation visual field:  OU:  Normal  Motility: OU:  Normal  Pupils: OU:  Shape, size, direct and consensual reaction normal  Adnexa: Preauricular LN, lacrimal drainage, lacrimal glands, orbit normal  Eyelids: Eyelids:  normal Conjunctiva: OU:  discharge: mucoid, ropy (sicca)  Cornea:  OD:  diffuse punctate staining OS:  diffuse punctate stainingOU:  decreased TBUT  Anterior Chamber: OU:  3+ deep / clear  Iris: OU:  normal, rubeosis absent Dilation:  OS: AK-Dilate, Tropicacyl @ 08:15AM  Lens:  OU:  2+ nuclear sclerotic cataract, cortical opacities, difficult to grade at bedside    Vitreous: OU:  normal  Optic Disc: OD:  cupping: 0.7 , NVD  OS:  cupping: 0.55 , NVD  Macula:  OD:  post PRP/Grid OD, cystic edema, fibrotic fibrovascular tissue at arcades OS:  Tractional Detachments OU:  Cicatricial Fibrovascular Membranes , DME  Vessels:   OU:  PDR       Periphery:   OU:  normal  Orientation to person, place and time:  Normal  Mood and affect:  Normal Impression:  250.50  Diabetes, type II, with ophthalmic manifestations (not stated as controlled) 362.02  Proliferative Diabetic Retinopathy OU 362.07  -Diabetic Macular Edema OU -  --post PRP OD x 03/2011 370.33  Dry eye, keratoconjunctivitis sicca OU  Plan/Treatment:  I discussed the natural history of Diabetic Eye Disease and it's forms. I discussed with illustrations how retina lack of circualtion causes low oxygen content and causes vascular changes and causes the retina to release chemicals that make abnormal, weed like, blood vessels grow.  She has tractional detachment of her retina OS. I have recommended proceeding with PPV, MP, FGX, EL OS with Osurdex implant to address both chronic DME and tractional retinal detachment form her DR.  I also discussed contnuing her K. Sicca and have requested that she also use artificial tear Gel drops to help the epithelium heal while the Restasis effect gets started which may take weeks.  She indicated understanding our discussion and felt that her questions had been answered to her satisfaction.  She indicates that she desires to proceed with the recommended treatment/care plan.   Medications:   No changes Patient Instructions: Please do not eat anything after mignight the day before surgery. Continue all regular medications as prescribed with a sip of water. Continue her Coumadin.  Schedule:  Pars Plana, Vitrectomy, Membrane Peeling, Endolaser, Fluid Gas Exchange, Osurdex implant possible Silicone Oil Left Eye   (electronically signed) Adonis Brook, MD

## 2013-01-26 ENCOUNTER — Encounter (HOSPITAL_COMMUNITY): Payer: Self-pay | Admitting: Pharmacy Technician

## 2013-01-27 MED ORDER — PREDNISOLONE ACETATE 1 % OP SUSP
1.0000 [drp] | OPHTHALMIC | Status: AC
  Administered 2013-01-28: 1 [drp] via OPHTHALMIC
  Filled 2013-01-27: qty 5

## 2013-01-27 MED ORDER — TETRACAINE HCL 0.5 % OP SOLN
2.0000 [drp] | OPHTHALMIC | Status: AC
  Administered 2013-01-28 (×2): 2 [drp] via OPHTHALMIC
  Filled 2013-01-27: qty 2

## 2013-01-27 MED ORDER — PHENYLEPHRINE HCL 2.5 % OP SOLN
1.0000 [drp] | OPHTHALMIC | Status: DC | PRN
  Administered 2013-01-28 (×2): 1 [drp] via OPHTHALMIC
  Filled 2013-01-27: qty 2

## 2013-01-27 MED ORDER — GATIFLOXACIN 0.5 % OP SOLN
1.0000 [drp] | OPHTHALMIC | Status: DC | PRN
  Administered 2013-01-28 (×2): 1 [drp] via OPHTHALMIC
  Filled 2013-01-27: qty 2.5

## 2013-01-27 NOTE — Progress Notes (Signed)
Anesthesia Chart Review:  Patient is a 52 year old female scheduled for pars plana vitrectomy, left eye by Dr. Anderson Malta on 01/28/13. Case is posted for general anesthesia.  PATIENT IS SCHEDULED TO BE A SAME DAY WORK-UP AND IS A FIRST CASE.  She has an extensive history includes cardiomyopathy felt "not likely ischemic" by cardiology notes (although no recent stress or cath noted), diabetes mellitus, PAD s/p bilateral BKA, hypertension, hyperlipidemia, multiple CVAs, seizure disorder, OSA, obesity, anemia, non-smoker.  She is a resident at [facility] in [place].  Cardiologist is Dr. Jenkins Rouge, but she has not seen him since 07/12/2011.    She had a 2D echo on 04/23/11 that showed: - Left ventricle: The lateral wall is mildy hypokinestic. All other walls are moderately/severely hypokinetic. The cavity size was normal. Wall thickness was increased in a pattern of mild LVH. Systolic function was moderately to severely reduced. The estimated ejection fraction was in the range of 30% to 35%. - Mitral valve: Mild regurgitation. - Left atrium: The atrium was mildly dilated. - Right ventricle: The cavity size was mildly to moderately dilated. Systolic function was mildly to moderately reduced. - Pericardium, extracardiac: A trivial pericardial effusion was identified posterior to the heart. - Impressions: The study of 06/12/2010 and today's study are both technically limited. However, I do believe that wall motion is decreased now since the prior study. Impressions: The study of 06/12/2010 and today's study are both technically limited. However, I do believe that wall motion is decreased now since the prior study. (Dr. Ron Parker)  Two days later on 04/25/11 she had a TEE that showed moderate LVH, severe ventricular dysfunction, EF 20-25%, mildly dilated RV with moderate to severe global hypokinesis, moderate to severe MR, moderate TR, biatrial enlargement with severe stasis (started on Coumadin then), no PFO,  negative bubble study.   No significant carotid artery stenosis by duplex on 05/04/11.  She will need labs, CXR, and EKG on arrival tomorrow.  Dr. Anderson Malta is not holding her Coumadin.  I did review above with anesthesiologist Dr. Tobias Alexander.  She tolerated left BKA on 01/18/12 and right BKA on 08/01/12.  She will be evaluated by her assigned anesthesiologist on the day of surgery.  If no acute CV/CHF symptoms and diagnostic studies are stable then he anticipates that she can proceed with this procedure.  George Hugh [facility] Short Stay Center/Anesthesiology Phone [phone] 01/27/2013 1:00 PM

## 2013-01-27 NOTE — Progress Notes (Signed)
I reviewed patients chart.   Pt is seen by [facility] cardiology and was seen in January 2013 and to be seen again in 1 year- I do not see that patient had a follow up.  Dr Nida Boatman notes instruct patient to stay on Coumadin.  I spoke to [place] about patients history and she spoke with Dr Tobias Alexander, no new orders were given.

## 2013-01-27 NOTE — Progress Notes (Signed)
Patient is a resident at [facility] in [place].  I spoke with Verdis Frederickson , pts nurse regarding history and gave her instructions for arrival time, medications to take and NPO after midnight.

## 2013-01-28 ENCOUNTER — Ambulatory Visit (HOSPITAL_COMMUNITY)
Admission: RE | Admit: 2013-01-28 | Discharge: 2013-01-28 | Disposition: A | Discharge status: Other Acute Inpatient Hospital | Payer: Medicare Other | Source: Ambulatory Visit | Attending: Ophthalmology | Admitting: Ophthalmology

## 2013-01-28 ENCOUNTER — Encounter (HOSPITAL_COMMUNITY)
Admission: RE | Disposition: A | Discharge status: Nursing Home (Includes ICF) | Payer: Self-pay | Source: Ambulatory Visit | Attending: Ophthalmology

## 2013-01-28 ENCOUNTER — Ambulatory Visit (HOSPITAL_COMMUNITY): Payer: Medicare Other

## 2013-01-28 ENCOUNTER — Encounter (HOSPITAL_COMMUNITY): Payer: Self-pay | Admitting: *Deleted

## 2013-01-28 ENCOUNTER — Ambulatory Visit (HOSPITAL_COMMUNITY): Payer: Medicare Other | Admitting: Vascular Surgery

## 2013-01-28 ENCOUNTER — Encounter (HOSPITAL_COMMUNITY): Payer: Self-pay | Admitting: Vascular Surgery

## 2013-01-28 DIAGNOSIS — Z79899 Other long term (current) drug therapy: Secondary | ICD-10-CM | POA: Insufficient documentation

## 2013-01-28 DIAGNOSIS — E11311 Type 2 diabetes mellitus with unspecified diabetic retinopathy with macular edema: Secondary | ICD-10-CM | POA: Insufficient documentation

## 2013-01-28 DIAGNOSIS — I69959 Hemiplegia and hemiparesis following unspecified cerebrovascular disease affecting unspecified side: Secondary | ICD-10-CM | POA: Insufficient documentation

## 2013-01-28 DIAGNOSIS — E7800 Pure hypercholesterolemia, unspecified: Secondary | ICD-10-CM | POA: Insufficient documentation

## 2013-01-28 DIAGNOSIS — I428 Other cardiomyopathies: Secondary | ICD-10-CM | POA: Insufficient documentation

## 2013-01-28 DIAGNOSIS — E11359 Type 2 diabetes mellitus with proliferative diabetic retinopathy without macular edema: Secondary | ICD-10-CM | POA: Insufficient documentation

## 2013-01-28 DIAGNOSIS — J301 Allergic rhinitis due to pollen: Secondary | ICD-10-CM | POA: Insufficient documentation

## 2013-01-28 DIAGNOSIS — M3500 Sicca syndrome, unspecified: Secondary | ICD-10-CM | POA: Insufficient documentation

## 2013-01-28 DIAGNOSIS — D649 Anemia, unspecified: Secondary | ICD-10-CM | POA: Insufficient documentation

## 2013-01-28 DIAGNOSIS — E785 Hyperlipidemia, unspecified: Secondary | ICD-10-CM | POA: Insufficient documentation

## 2013-01-28 DIAGNOSIS — I10 Essential (primary) hypertension: Secondary | ICD-10-CM | POA: Insufficient documentation

## 2013-01-28 DIAGNOSIS — H3340 Traction detachment of retina, unspecified eye: Secondary | ICD-10-CM | POA: Insufficient documentation

## 2013-01-28 DIAGNOSIS — Z7901 Long term (current) use of anticoagulants: Secondary | ICD-10-CM | POA: Insufficient documentation

## 2013-01-28 DIAGNOSIS — E1139 Type 2 diabetes mellitus with other diabetic ophthalmic complication: Secondary | ICD-10-CM | POA: Insufficient documentation

## 2013-01-28 DIAGNOSIS — H2510 Age-related nuclear cataract, unspecified eye: Secondary | ICD-10-CM | POA: Insufficient documentation

## 2013-01-28 DIAGNOSIS — G4733 Obstructive sleep apnea (adult) (pediatric): Secondary | ICD-10-CM | POA: Insufficient documentation

## 2013-01-28 DIAGNOSIS — S88119A Complete traumatic amputation at level between knee and ankle, unspecified lower leg, initial encounter: Secondary | ICD-10-CM | POA: Insufficient documentation

## 2013-01-28 HISTORY — PX: MEMBRANE PEEL: SHX5967

## 2013-01-28 HISTORY — PX: PHOTOCOAGULATION WITH LASER: SHX6027

## 2013-01-28 HISTORY — PX: PARS PLANA VITRECTOMY: SHX2166

## 2013-01-28 LAB — BASIC METABOLIC PANEL
BUN: 28 mg/dL — ABNORMAL HIGH (ref 6–23)
Chloride: 108 mEq/L (ref 96–112)
GFR calc Af Amer: 53 mL/min — ABNORMAL LOW (ref >90)
Glucose, Bld: 113 mg/dL — ABNORMAL HIGH (ref 70–99)
Potassium: 4.7 mEq/L (ref 3.5–5.1)

## 2013-01-28 LAB — CBC
HCT: 32.1 % — ABNORMAL LOW (ref 36.0–46.0)
Hemoglobin: 10.8 g/dL — ABNORMAL LOW (ref 12.0–15.0)
MCHC: 33.6 g/dL (ref 30.0–36.0)
RBC: 3.43 MIL/uL — ABNORMAL LOW (ref 3.87–5.11)

## 2013-01-28 LAB — PROTIME-INR: Prothrombin Time: 19.6 seconds — ABNORMAL HIGH (ref 11.6–15.2)

## 2013-01-28 LAB — GLUCOSE, CAPILLARY: Glucose-Capillary: 103 mg/dL — ABNORMAL HIGH (ref 70–99)

## 2013-01-28 SURGERY — PARS PLANA VITRECTOMY WITH 23 GAUGE
Anesthesia: Monitor Anesthesia Care | Site: Eye | Laterality: Left | Wound class: Clean

## 2013-01-28 MED ORDER — BSS IO SOLN
INTRAOCULAR | Status: DC | PRN
  Administered 2013-01-28: 15 mL via INTRAOCULAR

## 2013-01-28 MED ORDER — BACITRACIN-POLYMYXIN B 500-10000 UNIT/GM OP OINT
TOPICAL_OINTMENT | OPHTHALMIC | Status: DC | PRN
  Administered 2013-01-28: 1 via OPHTHALMIC

## 2013-01-28 MED ORDER — ROCURONIUM BROMIDE 100 MG/10ML IV SOLN
INTRAVENOUS | Status: DC | PRN
  Administered 2013-01-28: 50 mg via INTRAVENOUS

## 2013-01-28 MED ORDER — ONDANSETRON HCL 4 MG/2ML IJ SOLN
INTRAMUSCULAR | Status: DC | PRN
  Administered 2013-01-28: 4 mg via INTRAVENOUS

## 2013-01-28 MED ORDER — ONDANSETRON HCL 4 MG/2ML IJ SOLN: 4.0000 mg | Freq: Once | INTRAMUSCULAR | Status: DC | PRN

## 2013-01-28 MED ORDER — PROPOFOL 10 MG/ML IV BOLUS
INTRAVENOUS | Status: DC | PRN
  Administered 2013-01-28: 100 mg via INTRAVENOUS

## 2013-01-28 MED ORDER — LIDOCAINE HCL (CARDIAC) 20 MG/ML IV SOLN
INTRAVENOUS | Status: DC | PRN
  Administered 2013-01-28: 60 mg via INTRAVENOUS

## 2013-01-28 MED ORDER — LACTATED RINGERS IV SOLN
INTRAVENOUS | Status: DC | PRN
  Administered 2013-01-28: 08:00:00 via INTRAVENOUS

## 2013-01-28 MED ORDER — OXYCODONE HCL 5 MG PO TABS
ORAL_TABLET | ORAL | Status: AC
  Administered 2013-01-28: 5 mg
  Filled 2013-01-28: qty 1

## 2013-01-28 MED ORDER — BSS PLUS IO SOLN
INTRAOCULAR | Status: AC
  Filled 2013-01-28: qty 500

## 2013-01-28 MED ORDER — PHENYLEPHRINE HCL 10 MG/ML IJ SOLN
10.0000 mg | INTRAVENOUS | Status: DC | PRN
  Administered 2013-01-28: 10 ug/min via INTRAVENOUS

## 2013-01-28 MED ORDER — CEFAZOLIN SUBCONJUNCTIVAL INJECTION 100 MG/0.5 ML
200.0000 mg | INJECTION | SUBCONJUNCTIVAL | Status: DC
  Filled 2013-01-28: qty 1

## 2013-01-28 MED ORDER — OXYCODONE HCL 5 MG PO TABS: 5.0000 mg | ORAL_TABLET | Freq: Once | ORAL | Status: DC | PRN

## 2013-01-28 MED ORDER — HYDROMORPHONE HCL PF 1 MG/ML IJ SOLN
INTRAMUSCULAR | Status: AC
  Filled 2013-01-28: qty 1

## 2013-01-28 MED ORDER — PHENYLEPHRINE HCL 10 MG/ML IJ SOLN
INTRAMUSCULAR | Status: DC | PRN
  Administered 2013-01-28 (×9): 80 ug via INTRAVENOUS

## 2013-01-28 MED ORDER — GLYCOPYRROLATE 0.2 MG/ML IJ SOLN
INTRAMUSCULAR | Status: DC | PRN
  Administered 2013-01-28: 0.6 mg via INTRAVENOUS

## 2013-01-28 MED ORDER — DEXAMETHASONE SODIUM PHOSPHATE 10 MG/ML IJ SOLN
INTRAMUSCULAR | Status: DC | PRN
  Administered 2013-01-28: 10 mg

## 2013-01-28 MED ORDER — SODIUM HYALURONATE 10 MG/ML IO SOLN
INTRAOCULAR | Status: AC
  Filled 2013-01-28: qty 0.85

## 2013-01-28 MED ORDER — EPINEPHRINE HCL 1 MG/ML IJ SOLN
INTRAOCULAR | Status: DC | PRN
  Administered 2013-01-28: 08:00:00

## 2013-01-28 MED ORDER — TRIAMCINOLONE ACETONIDE 40 MG/ML IJ SUSP
INTRAMUSCULAR | Status: DC | PRN
  Administered 2013-01-28: 3 mL

## 2013-01-28 MED ORDER — NEOSTIGMINE METHYLSULFATE 1 MG/ML IJ SOLN
INTRAMUSCULAR | Status: DC | PRN
  Administered 2013-01-28: 4 mg via INTRAVENOUS

## 2013-01-28 MED ORDER — BUPIVACAINE HCL (PF) 0.75 % IJ SOLN
INTRAMUSCULAR | Status: DC | PRN
  Administered 2013-01-28: 10 mL

## 2013-01-28 MED ORDER — OXYCODONE HCL 5 MG/5ML PO SOLN: 5.0000 mg | Freq: Once | ORAL | Status: DC | PRN

## 2013-01-28 MED ORDER — HYPROMELLOSE (GONIOSCOPIC) 2.5 % OP SOLN
OPHTHALMIC | Status: DC | PRN
  Administered 2013-01-28: 2 [drp] via OPHTHALMIC

## 2013-01-28 MED ORDER — HYDROMORPHONE HCL PF 1 MG/ML IJ SOLN
0.2500 mg | INTRAMUSCULAR | Status: DC | PRN
  Administered 2013-01-28: 0.5 mg via INTRAVENOUS

## 2013-01-28 MED ORDER — FENTANYL CITRATE 0.05 MG/ML IJ SOLN
INTRAMUSCULAR | Status: DC | PRN
  Administered 2013-01-28: 50 ug via INTRAVENOUS

## 2013-01-28 MED ORDER — CEFAZOLIN SUBCONJUNCTIVAL INJECTION 100 MG/0.5 ML
INJECTION | SUBCONJUNCTIVAL | Status: DC | PRN
  Administered 2013-01-28: 200 mg via SUBCONJUNCTIVAL

## 2013-01-28 MED ORDER — NA CHONDROIT SULF-NA HYALURON 40-30 MG/ML IO SOLN
INTRAOCULAR | Status: AC
  Filled 2013-01-28: qty 0.5

## 2013-01-28 SURGICAL SUPPLY — 77 items
APPLICATOR COTTON TIP 6IN STRL (MISCELLANEOUS) ×2 IMPLANT
APPLICATOR DR MATTHEWS STRL (MISCELLANEOUS) IMPLANT
BAG FLD CLT MN 6.25X3.5 (WOUND CARE) ×1
BAG MINI COLL DRAIN (WOUND CARE) ×2 IMPLANT
BLADE EYE MINI 60D BEAVER (BLADE) IMPLANT
BLADE KERATOME 2.75 (BLADE) IMPLANT
BLADE MVR KNIFE 19G (BLADE) IMPLANT
BLADE STAB KNIFE 15DEG (BLADE) IMPLANT
CANNULA DUAL BORE 23G (CANNULA) IMPLANT
CANNULA VLV SOFT TIP 23GA (OPHTHALMIC) ×2 IMPLANT
CLOTH BEACON ORANGE TIMEOUT ST (SAFETY) ×2 IMPLANT
CORDS BIPOLAR (ELECTRODE) ×2 IMPLANT
COVER MAYO STAND STRL (DRAPES) ×2 IMPLANT
DRAPE OPHTHALMIC 77X100 STRL (CUSTOM PROCEDURE TRAY) ×2 IMPLANT
DRAPE POUCH INSTRU U-SHP 10X18 (DRAPES) ×2 IMPLANT
DRSG TEGADERM 4X4.75 (GAUZE/BANDAGES/DRESSINGS) ×2 IMPLANT
ERASER HMR WETFIELD 23G BP (MISCELLANEOUS) IMPLANT
FILTER BLUE MILLIPORE (MISCELLANEOUS) IMPLANT
FORCEPS GRIESHABER ILM 25G A (INSTRUMENTS) IMPLANT
GAS AUTO FILL CONSTEL (OPHTHALMIC)
GAS AUTO FILL CONSTELLATION (OPHTHALMIC) IMPLANT
GLOVE SS BIOGEL STRL SZ 6.5 (GLOVE) IMPLANT
GLOVE SUPERSENSE BIOGEL SZ 6.5 (GLOVE)
GLOVE SURG SS PI 6.5 STRL IVOR (GLOVE) ×2 IMPLANT
GLOVE SURG SS PI 7.0 STRL IVOR (GLOVE) ×2 IMPLANT
GOWN SRG XL XLNG 56XLVL 4 (GOWN DISPOSABLE) ×1 IMPLANT
GOWN STRL NON-REIN LRG LVL3 (GOWN DISPOSABLE) ×2 IMPLANT
GOWN STRL NON-REIN XL XLG LVL4 (GOWN DISPOSABLE) ×1
HANDLE PNEUMATIC FOR CONSTEL (OPHTHALMIC) IMPLANT
ILLUMINATOR WD SAPHIRE 23G (OPHTHALMIC) ×2 IMPLANT
KIT BASIN OR (CUSTOM PROCEDURE TRAY) ×2 IMPLANT
KIT ROOM TURNOVER OR (KITS) ×2 IMPLANT
KNIFE CRESCENT 1.75 EDGEAHEAD (BLADE) IMPLANT
KNIFE GRIESHABER SHARP 2.5MM (MISCELLANEOUS) IMPLANT
MASK EYE SHIELD (GAUZE/BANDAGES/DRESSINGS) ×2 IMPLANT
NEEDLE 18GX1X1/2 (RX/OR ONLY) (NEEDLE) ×2 IMPLANT
NEEDLE 22X1 1/2 (OR ONLY) (NEEDLE) IMPLANT
NEEDLE 25GX 5/8IN NON SAFETY (NEEDLE) IMPLANT
NEEDLE 27GX1/2 REG BEVEL ECLIP (NEEDLE) ×2 IMPLANT
NEEDLE FILTER BLUNT 18X 1/2SAF (NEEDLE) ×1
NEEDLE FILTER BLUNT 18X1 1/2 (NEEDLE) ×1 IMPLANT
NEEDLE HYPO 30X.5 LL (NEEDLE) ×4 IMPLANT
NS IRRIG 1000ML POUR BTL (IV SOLUTION) ×2 IMPLANT
PACK FRAGMATOME (OPHTHALMIC) IMPLANT
PACK VITRECTOMY CUSTOM (CUSTOM PROCEDURE TRAY) ×2 IMPLANT
PAD ARMBOARD 7.5X6 YLW CONV (MISCELLANEOUS) ×4 IMPLANT
PAD EYE OVAL STERILE LF (GAUZE/BANDAGES/DRESSINGS) ×2 IMPLANT
PAK VITRECTOMY PIK  23GA (OPHTHALMIC RELATED) ×2 IMPLANT
PENCIL BIPOLAR 25GA STR DISP (OPHTHALMIC RELATED) IMPLANT
PROBE LASER ILLUM FLEX CVD 23G (OPHTHALMIC) ×2 IMPLANT
ROLLS DENTAL (MISCELLANEOUS) ×4 IMPLANT
SCISSORS TIP ADVANCED DSP 25GA (INSTRUMENTS) IMPLANT
SCISSORS TIP GREISHABER 23 VER (OPHTHALMIC) IMPLANT
SCRAPER DIAMOND 25GA (OPHTHALMIC RELATED) ×2 IMPLANT
SCRAPER DIAMOND DUST MEMBRANE (MISCELLANEOUS) IMPLANT
SPEAR EYE SURG WECK-CEL (MISCELLANEOUS) ×4 IMPLANT
SUT ETHILON 10 0 CS140 6 (SUTURE) IMPLANT
SUT ETHILON 4 0 P 3 18 (SUTURE) IMPLANT
SUT ETHILON 5 0 P 3 18 (SUTURE)
SUT ETHILON 9 0 TG140 8 (SUTURE) IMPLANT
SUT NYLON 10.0 BLK (SUTURE) IMPLANT
SUT NYLON ETHILON 5-0 P-3 1X18 (SUTURE) IMPLANT
SUT PLAIN 6 0 TG1408 (SUTURE) IMPLANT
SUT POLY NON ABSORB 10-0 8 STR (SUTURE) IMPLANT
SUT VICRYL 7 0 TG140 8 (SUTURE) IMPLANT
SUT VICRYL ABS 6-0 S29 18IN (SUTURE) IMPLANT
SYR 20CC LL (SYRINGE) ×2 IMPLANT
SYR 5ML LL (SYRINGE) IMPLANT
SYR TB 1ML LUER SLIP (SYRINGE) ×2 IMPLANT
SYRINGE 10CC LL (SYRINGE) IMPLANT
TAPE CLOTH 4X10 WHT NS (GAUZE/BANDAGES/DRESSINGS) ×2 IMPLANT
TAPE PAPER MEDFIX 1IN X 10YD (GAUZE/BANDAGES/DRESSINGS) ×2 IMPLANT
TOWEL OR 17X24 6PK STRL BLUE (TOWEL DISPOSABLE) ×6 IMPLANT
TUBE EXTENSION HAMMER (TUBING) IMPLANT
WATER STERILE IRR 1000ML POUR (IV SOLUTION) ×2 IMPLANT
WIPE INSTRUMENT ADHESIVE BACK (MISCELLANEOUS) ×2 IMPLANT
WIPE INSTRUMENT VISIWIPE 73X73 (MISCELLANEOUS) ×2 IMPLANT

## 2013-01-28 NOTE — Preoperative (Signed)
Beta Blockers   Reason not to administer Beta Blockers:Not Applicable 

## 2013-01-28 NOTE — Interval H&P Note (Signed)
History and Physical Interval Note:  01/28/2013 8:33 AM  [name]  has presented today for surgery, with the diagnosis of Tractional Retinal Detachment, Proliferative Diabetic Retinopathy Left Eye  The various methods of treatment have been discussed with the patient and family. After consideration of risks, benefits and other options for treatment, the patient has consented to  Procedure(s): PARS PLANA VITRECTOMY WITH 23 GAUGE (Left) as a surgical intervention .  The patient's history has been reviewed, patient examined, no change in status, stable for surgery.  I have reviewed the patient's chart and labs.  Questions were answered to the patient's satisfaction.     Giani Betzold, Lavella Hammock

## 2013-01-28 NOTE — Transfer of Care (Signed)
Immediate Anesthesia Transfer of Care Note  Patient: [name]  Procedure(s) Performed: Procedure(s) with comments: PARS PLANA VITRECTOMY WITH 23 GAUGE (Left) PHOTOCOAGULATION WITH LASER (Left) - ENDOLASER MEMBRANE PEEL (Left)  Patient Location: PACU  Anesthesia Type:General  Level of Consciousness: awake, alert , oriented and sedated  Airway & Oxygen Therapy: Patient Spontanous Breathing and Patient connected to nasal cannula oxygen  Post-op Assessment: Report given to PACU RN, Post -op Vital signs reviewed and stable and Patient moving all extremities  Post vital signs: Reviewed and stable  Complications: No apparent anesthesia complications

## 2013-01-28 NOTE — H&P (View-Only) (Signed)
Patient Record  [name], [name]  Patient Number:  [id number] Date of Birth:  [birth year] Age:  52 years old    Gender:  Female Date of Evaluation:  January 22, 2013  Chief Complaint:   The patient has been lost to followup  for Proliferative Diabetic Retinopathy and DME OU. She has had bilateral BKA over the last two years as well as a stroke. She has not returened for follow up care because of same. She reports poor vision OU and desires evaluation. She did not complete PRP OS as was planned. She was evaluated on a stretcher from the ambulance. She is unable to transfer or sit up. History of Present Illness:   DM x over 10 years. Patient with Severe Diabetic Retinopathy has had PRP OU.  She complains of Photophobia and foreign body sensation along with eye pain OU. Past History:  Allergies:  vancomycin (questionable), Active Medications:   Ocular Medications:  Restasis OU QD Other Medications:  pravastatin (pravastatin) tablet 40 mg 1 tablet by mouth at bedtime as directed tablet, metoprolol ta-hydrochlorothiaz (metoprolol ta-hydrochlorothiaz) tablet 50-25 mg 1 tablet by mouth twice a day as directed tablet, metformin (metformin) tablet 1,000 mg 1 tablet by mouth twice a day with meals tablet, lisinopril (lisinopril) tablet 20 mg 1 tablet by mouth twice a day as directed tablet, hydrochlorothiazide (hydrochlorothiazide) capsule 12.5 mg 1 capsule by mouth once a day as directed capsule, gabapentin (gabapentin) tablet 600 mg 1 tablet by mouth four times a day as directed tablet, ferrous sulfate (ferrous sulfate) tablet 325 mg (65 mg iron) 1 tablet by mouth twice a day as directed tablet, amitriptyline (amitriptyline) tablet 50 mg 1 tablet by mouth once a day as needed tablet Birth History:  none Past Ocular History:   PDR, DME, Cataract Past Medical History:   DM x  10 years,  Hypertension,  Elevated Cholesterol, CVA x 05/2010 Cerebrovascular Accident with left side paresis Past Surgical History:   left ear surgery, hole in ear drum Bilateral BKA Family History:  no amblyopia, no blindness, no cataracts, no crossed eyes, no diabetic retinopathy, no glaucoma, no macular degeneration, no retinal detachment, + cancer (mother), + diabetes (mother, grandmother), + heart disease (father), + high blood pressure (father), no stroke Social History:  Smoking Status: never smoker   Alcohol:  none   Driving status:  not driving  Marital status:  single Review of Systems:   Constitutional:  no fever, no weight loss    Eyes: + decreased vision  Ear/Nose/Throat:  no hearing loss, no sinus problems    Cardiovascular:  + high blood pressure, + high cholesterol  Respiratory:  no shortness of breath, no wheezing    Gastrointestinal:  no abdominal pain, no nausea    Genitourinary:  no blood in urine, no discomfort    Musculoskeletal: + low back pain  Integumentary skin/breast:  no rashes, no skin tumors    Neurological: + numbness, + weakness   Psychiatric: + anxiety, + depression  Endocrine:  no heat intolerance, no thyroid problems    Hematologic/Lymphatic: + anemia   Allergic/Immunologic: + seasonal allergies Examination:  Visual Acuity:   [facility] [place]:  OD: 20/200 ph +3.00    OS: 20/200 ph +3.00    @ 14 inches IOP:  OD:  11, Perkins tonometer     OS:  11, Perkins tonometer    @ 05:10PM (Goldmann applanation)  Confrontation visual field:  OU:  Normal  Motility: OU:  Normal  Pupils: OU:  Shape, size, direct and consensual reaction normal  Adnexa: Preauricular LN, lacrimal drainage, lacrimal glands, orbit normal  Eyelids: Eyelids:  normal Conjunctiva: OU:  discharge: mucoid, ropy (sicca)  Cornea:  OD:  diffuse punctate staining OS:  diffuse punctate stainingOU:  decreased TBUT  Anterior Chamber: OU:  3+ deep / clear  Iris: OU:  normal, rubeosis absent Dilation:  OS: AK-Dilate, Tropicacyl @ 08:15AM  Lens:  OU:  2+ nuclear sclerotic cataract, cortical opacities, difficult to grade at bedside    Vitreous: OU:  normal  Optic Disc: OD:  cupping: 0.7 , NVD  OS:  cupping: 0.55 , NVD  Macula:  OD:  post PRP/Grid OD, cystic edema, fibrotic fibrovascular tissue at arcades OS:  Tractional Detachments OU:  Cicatricial Fibrovascular Membranes , DME  Vessels:   OU:  PDR       Periphery:   OU:  normal  Orientation to person, place and time:  Normal  Mood and affect:  Normal Impression:  250.50  Diabetes, type II, with ophthalmic manifestations (not stated as controlled) 362.02  Proliferative Diabetic Retinopathy OU 362.07  -Diabetic Macular Edema OU -  --post PRP OD x 03/2011 370.33  Dry eye, keratoconjunctivitis sicca OU  Plan/Treatment:  I discussed the natural history of Diabetic Eye Disease and it's forms. I discussed with illustrations how retina lack of circualtion causes low oxygen content and causes vascular changes and causes the retina to release chemicals that make abnormal, weed like, blood vessels grow.  She has tractional detachment of her retina OS. I have recommended proceeding with PPV, MP, FGX, EL OS with Osurdex implant to address both chronic DME and tractional retinal detachment form her DR.  I also discussed contnuing her K. Sicca and have requested that she also use artificial tear Gel drops to help the epithelium heal while the Restasis effect gets started which may take weeks.  She indicated understanding our discussion and felt that her questions had been answered to her satisfaction.  She indicates that she desires to proceed with the recommended treatment/care plan.   Medications:   No changes Patient Instructions: Please do not eat anything after mignight the day before surgery. Continue all regular medications as prescribed with a sip of water. Continue her Coumadin.  Schedule:  Pars Plana, Vitrectomy, Membrane Peeling, Endolaser, Fluid Gas Exchange, Osurdex implant possible Silicone Oil Left Eye   (electronically signed) Adonis Brook, MD

## 2013-01-28 NOTE — Op Note (Signed)
[name] 01/28/2013 Diagnosis: Proliferative Diabetic Retinopathy, pre-retinal fibrosis, Diabetic Macular Edema    Procedure: Pars Plana Vitrectomy, Membrane Peeling, Endolaser and Fluid Gas Exchange Operative Eye:  left eye  Surgeon: Adonis Brook Estimated Blood Loss: minimal Specimens for Pathology:  None Complications: none   The  patient was prepped and draped in the usual fashion for ocular surgery on the  left eye .  A solid lid speculum was placed. The conjunctiva was displaced with a cotton tipped applicator at the  [id number]  meridian. A trocar/cannula was placed 3.5 mm from the surgical limbus. The cannula was visualized in the vitreous cavity. The infusion line was allowed to run and then clamped when placed at the cannula opening. The line was inserted and secured to the drape with an adhesive strip. Trocar/Cannulas were then placed at the 9:30 and 2:30 meridian. The light pipe and vitreous cutter were inserted into the vitreous cavity and the wide field lens was placed. She was noted to have broad pre-retinal fibrovascular tissue covering the entire post equatorial surface. The was extensive occlusion of retinal vascular with multiple white vessels into the peripheral retina. The was massive cystic edema. The vitreous cutter was placed on suction mode and used to separate the posterior vitreous face from the retina in the periphery. The vitreous cutter was then used to remove the peripheral posterior vitreous for 360 degrees. The vitreous cutter was then used to lift the fibrovacular tissue from the retina. Where it would peel freely that was done. The cutter was then used to segment the membranes and used to delaminate them form the arcade blood vessels and other smaller vascular attachments beyond the arcades. Triamcinolone Acetate was then placed over the macula to facilitate membrane peeling from the Macular surface. Pick forceps were utilized to remove residual membrane elevated using  a diamond dusted silicone brush.  Total air fluid exchange was acrried out and full panre-tinal photocoagulation was applied for 360 degrees inclusive of some Grid Laser treatment in the macula away from the foveal center. The silicone tipped cannula was again used to remove residual pre-retinal fluid.  The cannulas were removed from the 9:30 and 2:30 positions with concommitant tamponade using a cotton tipped applicator. Subconjunctival injections of Ancef 100mg /0.51ml and Dexamethasone 4mg /74ml were placed in the infero-medial quadrant to avoid proximity to the cannula sites. Triamcinolone Acetate, 40 mg was placed i a posterior subtenons fashion to address macular edema and post-operative inflammation. 0.77ml of Triamcinolone 40mg /ml was placed in the vitreous cavity at the close of the procedure to address marked DME.  The infusion cannula was removed with concomitant tamponade with the cotton tipped applicator leaving the ocular pressure less than 10 by palpation.  The speculum and drapes were removed and the eye was patched with Polymixin/Bacitracin ophthalmic ointment. An eye shield was placed and the patient was transferred alert and conversant with stable vital signs to the post operative recovery area.  Adonis Brook MD

## 2013-01-28 NOTE — Anesthesia Preprocedure Evaluation (Signed)
Anesthesia Evaluation  Patient identified by MRN, date of birth, ID band Patient awake    Reviewed: Allergy & Precautions, H&P , NPO status , Patient's Chart, lab work & pertinent test results, reviewed documented beta blocker date and time   Airway Mallampati: I TM Distance: >3 FB Neck ROM: Full    Dental  (+) Poor Dentition, Missing and Dental Advisory Given   Pulmonary neg sleep apnea,  breath sounds clear to auscultation        Cardiovascular hypertension, Pt. on medications and Pt. on home beta blockers Rhythm:Regular Rate:Normal     Neuro/Psych    GI/Hepatic   Endo/Other  diabetes, Well Controlled, Type 2, Insulin Dependent and Oral Hypoglycemic AgentsMorbid obesity  Renal/GU      Musculoskeletal   Abdominal   Peds  Hematology   Anesthesia Other Findings   Reproductive/Obstetrics                           Anesthesia Physical Anesthesia Plan  ASA: III  Anesthesia Plan: General and MAC   Post-op Pain Management:    Induction: Intravenous  Airway Management Planned: Oral ETT and Mask  Additional Equipment:   Intra-op Plan:   Post-operative Plan: Extubation in OR  Informed Consent: I have reviewed the patients History and Physical, chart, labs and discussed the procedure including the risks, benefits and alternatives for the proposed anesthesia with the patient or authorized representative who has indicated his/her understanding and acceptance.   Dental advisory given  Plan Discussed with: CRNA, Anesthesiologist and Surgeon  Anesthesia Plan Comments:         Anesthesia Quick Evaluation

## 2013-01-28 NOTE — Anesthesia Postprocedure Evaluation (Signed)
  Anesthesia Post-op Note  Patient: [name]  Procedure(s) Performed: Procedure(s) with comments: PARS PLANA VITRECTOMY WITH 23 GAUGE (Left) PHOTOCOAGULATION WITH LASER (Left) - ENDOLASER MEMBRANE PEEL (Left)  Patient Location: PACU  Anesthesia Type:General  Level of Consciousness: awake and alert   Airway and Oxygen Therapy: Patient Spontanous Breathing  Post-op Pain: none  Post-op Assessment: Post-op Vital signs reviewed, Patient's Cardiovascular Status Stable, Respiratory Function Stable, Patent Airway and No signs of Nausea or vomiting  Post-op Vital Signs: Reviewed and stable  Complications: No apparent anesthesia complications

## 2013-02-03 ENCOUNTER — Encounter (HOSPITAL_COMMUNITY): Payer: Self-pay | Admitting: Ophthalmology

## 2013-02-03 NOTE — Procedures (Signed)
ELECTROENCEPHALOGRAM REPORT  Date of Study: 01/20/2013  Patient's Name: [name] MRN: [id number] Date of Birth: 26-Mar-1961  Referring Provider: Dr. Tomi Likens  Indication: 52 year old woman with history of right MCA stroke and subsequent seizure who presents with further cognitive decline.  Medications: Phenytoin Alprazolam Gabapentin Hydrocodone Methylphenidate Sertraline  Technical Summary: This is a multichannel digital EEG recording, using the international 10-20 placement system.  Spike detection software was employed.  Description: The EEG background is symmetric and disorganized, with a poorly-defined posterior dominant rhythm of 6 Hz, which is reactive to eye opening and closing.  Diffuse background slowing, consisting of delta and theta frequencies, are seen.  No focal abnormalities are seen.  No focal or generalized epileptiform discharges are seen.  Stage II sleep is not seen.  Photic stimulation was performed and produced no abnormalities.  Hyperventilation was omitted.  ECG revealed normal cardiac rate and rhythm.  Impression: This is an abnormal EEG of the awake and drowsy states, with activating procedures, due to generalized slowing and disorganization of the EEG background.  This is indicative of a diffuse physiologic abnormality or encephalopathy.  This is a non-specific finding and may be secondary to toxic-metabolic, pharmacologic or organic cognitive impairment etiology.    Adam R. Tomi Likens, DO

## 2013-03-13 ENCOUNTER — Encounter (HOSPITAL_COMMUNITY): Payer: Self-pay | Admitting: Pharmacy Technician

## 2013-03-17 ENCOUNTER — Encounter (HOSPITAL_COMMUNITY): Payer: Self-pay | Admitting: *Deleted

## 2013-03-17 ENCOUNTER — Other Ambulatory Visit (HOSPITAL_COMMUNITY): Payer: Self-pay | Admitting: Orthopedic Surgery

## 2013-03-17 MED ORDER — CEFAZOLIN SODIUM-DEXTROSE 2-3 GM-% IV SOLR
2.0000 g | INTRAVENOUS | Status: AC
  Administered 2013-03-18: 2 g via INTRAVENOUS

## 2013-03-17 NOTE — Progress Notes (Signed)
Coumadin has not been stopped per the nurse at nursing facility. Spoke with Ebony Hail, [place] and she states Dr. Sharol Given would need to be notified. I called and spoke with Lauren at Dr. Jess Barters office and she states she will get this information to him and call me back.

## 2013-03-17 NOTE — Progress Notes (Signed)
Lauren from Dr. Jess Barters office called back and said that Dr. Sharol Given stated "that was fine" about the coumadin not being stopped.

## 2013-03-17 NOTE — Progress Notes (Signed)
Spoke with Flossie Buffy, Therapist, sports at [facility] [place] [facility]. She was able to verify meds, allergies and medical hx. States the patient chart was not available and could not verify surgical hx. Gave her the time of surgery, arrival time for pt, meds for pt to take before surgery.

## 2013-03-18 ENCOUNTER — Ambulatory Visit: Payer: Medicaid Other | Admitting: Neurology

## 2013-03-18 ENCOUNTER — Encounter (HOSPITAL_COMMUNITY): Payer: Medicare Other | Admitting: Anesthesiology

## 2013-03-18 ENCOUNTER — Encounter (HOSPITAL_COMMUNITY): Payer: Self-pay | Admitting: Anesthesiology

## 2013-03-18 ENCOUNTER — Inpatient Hospital Stay (HOSPITAL_COMMUNITY)
Admission: RE | Admit: 2013-03-18 | Discharge: 2013-03-19 | DRG: 240 | Disposition: A | Discharge status: Nursing Home (Includes ICF) | Payer: Medicare Other | Source: Ambulatory Visit | Attending: Orthopedic Surgery | Admitting: Orthopedic Surgery

## 2013-03-18 ENCOUNTER — Inpatient Hospital Stay (HOSPITAL_COMMUNITY): Payer: Medicare Other | Admitting: Anesthesiology

## 2013-03-18 ENCOUNTER — Encounter (HOSPITAL_COMMUNITY)
Admission: RE | Disposition: A | Discharge status: Nursing Home (Includes ICF) | Payer: Self-pay | Source: Ambulatory Visit | Attending: Orthopedic Surgery

## 2013-03-18 DIAGNOSIS — Z87898 Personal history of other specified conditions: Secondary | ICD-10-CM | POA: Diagnosis present

## 2013-03-18 DIAGNOSIS — I10 Essential (primary) hypertension: Secondary | ICD-10-CM | POA: Diagnosis present

## 2013-03-18 DIAGNOSIS — E119 Type 2 diabetes mellitus without complications: Secondary | ICD-10-CM

## 2013-03-18 DIAGNOSIS — Z7901 Long term (current) use of anticoagulants: Secondary | ICD-10-CM

## 2013-03-18 DIAGNOSIS — F3289 Other specified depressive episodes: Secondary | ICD-10-CM | POA: Diagnosis present

## 2013-03-18 DIAGNOSIS — M545 Low back pain, unspecified: Secondary | ICD-10-CM | POA: Diagnosis present

## 2013-03-18 DIAGNOSIS — M1990 Unspecified osteoarthritis, unspecified site: Secondary | ICD-10-CM | POA: Diagnosis present

## 2013-03-18 DIAGNOSIS — G4730 Sleep apnea, unspecified: Secondary | ICD-10-CM | POA: Diagnosis present

## 2013-03-18 DIAGNOSIS — F411 Generalized anxiety disorder: Secondary | ICD-10-CM | POA: Diagnosis present

## 2013-03-18 DIAGNOSIS — I429 Cardiomyopathy, unspecified: Secondary | ICD-10-CM

## 2013-03-18 DIAGNOSIS — S98139A Complete traumatic amputation of one unspecified lesser toe, initial encounter: Secondary | ICD-10-CM

## 2013-03-18 DIAGNOSIS — I70269 Atherosclerosis of native arteries of extremities with gangrene, unspecified extremity: Secondary | ICD-10-CM

## 2013-03-18 DIAGNOSIS — L97109 Non-pressure chronic ulcer of unspecified thigh with unspecified severity: Secondary | ICD-10-CM | POA: Diagnosis present

## 2013-03-18 DIAGNOSIS — Z8673 Personal history of transient ischemic attack (TIA), and cerebral infarction without residual deficits: Secondary | ICD-10-CM

## 2013-03-18 DIAGNOSIS — Z8614 Personal history of Methicillin resistant Staphylococcus aureus infection: Secondary | ICD-10-CM

## 2013-03-18 DIAGNOSIS — L8990 Pressure ulcer of unspecified site, unspecified stage: Secondary | ICD-10-CM | POA: Diagnosis present

## 2013-03-18 DIAGNOSIS — E1159 Type 2 diabetes mellitus with other circulatory complications: Secondary | ICD-10-CM | POA: Diagnosis present

## 2013-03-18 DIAGNOSIS — R569 Unspecified convulsions: Secondary | ICD-10-CM | POA: Diagnosis present

## 2013-03-18 DIAGNOSIS — I96 Gangrene, not elsewhere classified: Secondary | ICD-10-CM | POA: Diagnosis present

## 2013-03-18 DIAGNOSIS — E669 Obesity, unspecified: Secondary | ICD-10-CM | POA: Diagnosis present

## 2013-03-18 DIAGNOSIS — E785 Hyperlipidemia, unspecified: Secondary | ICD-10-CM | POA: Diagnosis present

## 2013-03-18 DIAGNOSIS — I428 Other cardiomyopathies: Secondary | ICD-10-CM | POA: Diagnosis present

## 2013-03-18 DIAGNOSIS — K219 Gastro-esophageal reflux disease without esophagitis: Secondary | ICD-10-CM | POA: Diagnosis present

## 2013-03-18 DIAGNOSIS — Z794 Long term (current) use of insulin: Secondary | ICD-10-CM

## 2013-03-18 DIAGNOSIS — I739 Peripheral vascular disease, unspecified: Principal | ICD-10-CM | POA: Diagnosis present

## 2013-03-18 DIAGNOSIS — F329 Major depressive disorder, single episode, unspecified: Secondary | ICD-10-CM | POA: Diagnosis present

## 2013-03-18 HISTORY — DX: Peripheral vascular disease, unspecified: I73.9

## 2013-03-18 HISTORY — PX: AMPUTATION: SHX166

## 2013-03-18 LAB — COMPREHENSIVE METABOLIC PANEL
BUN: 50 mg/dL — ABNORMAL HIGH (ref 6–23)
CO2: 20 mEq/L (ref 19–32)
Chloride: 110 mEq/L (ref 96–112)
Creatinine, Ser: 1.47 mg/dL — ABNORMAL HIGH (ref 0.50–1.10)
GFR calc Af Amer: 47 mL/min — ABNORMAL LOW (ref >90)
GFR calc non Af Amer: 41 mL/min — ABNORMAL LOW (ref >90)
Glucose, Bld: 98 mg/dL (ref 70–99)
Total Bilirubin: 0.2 mg/dL — ABNORMAL LOW (ref 0.3–1.2)

## 2013-03-18 LAB — CBC
HCT: 29.4 % — ABNORMAL LOW (ref 36.0–46.0)
Hemoglobin: 9.9 g/dL — ABNORMAL LOW (ref 12.0–15.0)
MCV: 90.7 fL (ref 78.0–100.0)
RDW: 13.7 % (ref 11.5–15.5)
WBC: 8.2 10*3/uL (ref 4.0–10.5)

## 2013-03-18 LAB — GLUCOSE, CAPILLARY
Glucose-Capillary: 119 mg/dL — ABNORMAL HIGH (ref 70–99)
Glucose-Capillary: 94 mg/dL (ref 70–99)

## 2013-03-18 LAB — PROTIME-INR
INR: 3.65 — ABNORMAL HIGH (ref 0.00–1.49)
Prothrombin Time: 34.9 seconds — ABNORMAL HIGH (ref 11.6–15.2)

## 2013-03-18 SURGERY — AMPUTATION, ABOVE KNEE
Anesthesia: General | Site: Leg Upper | Laterality: Left

## 2013-03-18 MED ORDER — OXYCODONE-ACETAMINOPHEN 5-325 MG PO TABS
1.0000 | ORAL_TABLET | ORAL | Status: DC | PRN
  Administered 2013-03-18: 2 via ORAL

## 2013-03-18 MED ORDER — OXYCODONE-ACETAMINOPHEN 5-325 MG PO TABS
ORAL_TABLET | ORAL | Status: AC
  Administered 2013-03-18: 2
  Filled 2013-03-18: qty 2

## 2013-03-18 MED ORDER — CEFAZOLIN SODIUM-DEXTROSE 2-3 GM-% IV SOLR
2.0000 g | Freq: Four times a day (QID) | INTRAVENOUS | Status: AC
  Administered 2013-03-18 – 2013-03-19 (×3): 2 g via INTRAVENOUS
  Filled 2013-03-18 (×3): qty 50

## 2013-03-18 MED ORDER — PROPOFOL 10 MG/ML IV BOLUS
INTRAVENOUS | Status: DC | PRN
  Administered 2013-03-18: 150 mg via INTRAVENOUS

## 2013-03-18 MED ORDER — METOCLOPRAMIDE HCL 5 MG/ML IJ SOLN: 5.0000 mg | Freq: Three times a day (TID) | INTRAMUSCULAR | Status: DC | PRN

## 2013-03-18 MED ORDER — FUROSEMIDE 20 MG PO TABS
20.0000 mg | ORAL_TABLET | Freq: Two times a day (BID) | ORAL | Status: DC
  Administered 2013-03-18 – 2013-03-19 (×2): 20 mg via ORAL
  Filled 2013-03-18 (×4): qty 1

## 2013-03-18 MED ORDER — HYDROMORPHONE HCL PF 1 MG/ML IJ SOLN
0.2500 mg | INTRAMUSCULAR | Status: DC | PRN
  Administered 2013-03-18 (×3): 0.5 mg via INTRAVENOUS

## 2013-03-18 MED ORDER — ONDANSETRON HCL 4 MG/2ML IJ SOLN: 4.0000 mg | Freq: Once | INTRAMUSCULAR | Status: DC | PRN

## 2013-03-18 MED ORDER — WARFARIN - PHARMACIST DOSING INPATIENT: Freq: Every day | Status: DC

## 2013-03-18 MED ORDER — SERTRALINE HCL 50 MG PO TABS
150.0000 mg | ORAL_TABLET | Freq: Every day | ORAL | Status: DC
  Administered 2013-03-18 – 2013-03-19 (×2): 150 mg via ORAL
  Filled 2013-03-18 (×2): qty 1

## 2013-03-18 MED ORDER — AZITHROMYCIN 250 MG PO TABS: 250.0000 mg | ORAL_TABLET | Freq: Every day | ORAL | Status: DC

## 2013-03-18 MED ORDER — GABAPENTIN 300 MG PO CAPS
600.0000 mg | ORAL_CAPSULE | Freq: Four times a day (QID) | ORAL | Status: DC
  Administered 2013-03-18 – 2013-03-19 (×4): 600 mg via ORAL
  Filled 2013-03-18 (×6): qty 2

## 2013-03-18 MED ORDER — ONDANSETRON HCL 4 MG/2ML IJ SOLN
4.0000 mg | Freq: Four times a day (QID) | INTRAMUSCULAR | Status: DC | PRN
  Administered 2013-03-19 (×2): 4 mg via INTRAVENOUS
  Filled 2013-03-18 (×2): qty 2

## 2013-03-18 MED ORDER — RESOURCE ARGINAID PO PACK: 1.0000 | PACK | Freq: Two times a day (BID) | ORAL | Status: DC

## 2013-03-18 MED ORDER — HYDROMORPHONE HCL PF 1 MG/ML IJ SOLN
INTRAMUSCULAR | Status: AC
  Administered 2013-03-18: 0.5 mg via INTRAVENOUS
  Filled 2013-03-18: qty 1

## 2013-03-18 MED ORDER — LACTATED RINGERS IV SOLN
INTRAVENOUS | Status: DC | PRN
  Administered 2013-03-18: 08:00:00 via INTRAVENOUS

## 2013-03-18 MED ORDER — CYCLOSPORINE 0.05 % OP EMUL
1.0000 [drp] | OPHTHALMIC | Status: DC
  Administered 2013-03-18 – 2013-03-19 (×2): 1 [drp] via OPHTHALMIC
  Filled 2013-03-18 (×4): qty 1

## 2013-03-18 MED ORDER — POLYETHYL GLYCOL-PROPYL GLYCOL 0.4-0.3 % OP GEL: 1.0000 [drp] | Freq: Three times a day (TID) | OPHTHALMIC | Status: DC

## 2013-03-18 MED ORDER — POLYVINYL ALCOHOL 1.4 % OP SOLN
1.0000 [drp] | Freq: Three times a day (TID) | OPHTHALMIC | Status: DC
  Administered 2013-03-18 – 2013-03-19 (×3): 1 [drp] via OPHTHALMIC
  Filled 2013-03-18: qty 15

## 2013-03-18 MED ORDER — LIDOCAINE HCL (CARDIAC) 20 MG/ML IV SOLN
INTRAVENOUS | Status: DC | PRN
  Administered 2013-03-18: 100 mg via INTRAVENOUS

## 2013-03-18 MED ORDER — SODIUM CHLORIDE 0.9 % IV SOLN: INTRAVENOUS | Status: DC

## 2013-03-18 MED ORDER — ONDANSETRON HCL 4 MG/2ML IJ SOLN
INTRAMUSCULAR | Status: DC | PRN
  Administered 2013-03-18: 4 mg via INTRAMUSCULAR

## 2013-03-18 MED ORDER — SIMVASTATIN 20 MG PO TABS
20.0000 mg | ORAL_TABLET | Freq: Every day | ORAL | Status: DC
  Administered 2013-03-18: 20 mg via ORAL
  Filled 2013-03-18 (×2): qty 1

## 2013-03-18 MED ORDER — METOCLOPRAMIDE HCL 10 MG PO TABS: 5.0000 mg | ORAL_TABLET | Freq: Three times a day (TID) | ORAL | Status: DC | PRN

## 2013-03-18 MED ORDER — FERROUS SULFATE 325 (65 FE) MG PO TABS
325.0000 mg | ORAL_TABLET | Freq: Two times a day (BID) | ORAL | Status: DC
  Administered 2013-03-18 – 2013-03-19 (×2): 325 mg via ORAL
  Filled 2013-03-18 (×3): qty 1

## 2013-03-18 MED ORDER — HYDROMORPHONE HCL PF 1 MG/ML IJ SOLN
0.5000 mg | INTRAMUSCULAR | Status: DC | PRN
  Administered 2013-03-19: 1 mg via INTRAVENOUS
  Filled 2013-03-18: qty 1

## 2013-03-18 MED ORDER — AMLODIPINE BESYLATE 5 MG PO TABS
5.0000 mg | ORAL_TABLET | Freq: Every day | ORAL | Status: DC
  Administered 2013-03-18 – 2013-03-19 (×2): 5 mg via ORAL
  Filled 2013-03-18 (×2): qty 1

## 2013-03-18 MED ORDER — CEFAZOLIN SODIUM-DEXTROSE 2-3 GM-% IV SOLR
INTRAVENOUS | Status: AC
  Filled 2013-03-18: qty 50

## 2013-03-18 MED ORDER — GATIFLOXACIN 0.5 % OP SOLN
1.0000 [drp] | Freq: Four times a day (QID) | OPHTHALMIC | Status: DC
  Administered 2013-03-18 – 2013-03-19 (×4): 1 [drp] via OPHTHALMIC
  Filled 2013-03-18: qty 2.5

## 2013-03-18 MED ORDER — FENTANYL CITRATE 0.05 MG/ML IJ SOLN
INTRAMUSCULAR | Status: DC | PRN
  Administered 2013-03-18: 100 ug via INTRAVENOUS
  Administered 2013-03-18: 50 ug via INTRAVENOUS

## 2013-03-18 MED ORDER — INSULIN ASPART 100 UNIT/ML ~~LOC~~ SOLN
6.0000 [IU] | Freq: Three times a day (TID) | SUBCUTANEOUS | Status: DC
  Administered 2013-03-18: 6 [IU] via SUBCUTANEOUS

## 2013-03-18 MED ORDER — SUCCINYLCHOLINE CHLORIDE 20 MG/ML IJ SOLN
INTRAMUSCULAR | Status: DC | PRN
  Administered 2013-03-18: 100 mg via INTRAVENOUS

## 2013-03-18 MED ORDER — RESOURCE THICKENUP CLEAR PO POWD
ORAL | Status: DC | PRN
  Filled 2013-03-18: qty 125

## 2013-03-18 MED ORDER — WARFARIN SODIUM 6 MG PO TABS: 6.0000 mg | ORAL_TABLET | Freq: Every day | ORAL | Status: DC

## 2013-03-18 MED ORDER — LEVETIRACETAM 500 MG PO TABS
500.0000 mg | ORAL_TABLET | Freq: Two times a day (BID) | ORAL | Status: DC
  Administered 2013-03-18: 500 mg via ORAL
  Filled 2013-03-18 (×3): qty 1

## 2013-03-18 MED ORDER — OXYCODONE-ACETAMINOPHEN 5-325 MG PO TABS
ORAL_TABLET | ORAL | Status: AC
  Administered 2013-03-18: 2 via ORAL
  Filled 2013-03-18: qty 2

## 2013-03-18 MED ORDER — METHYLPHENIDATE HCL 5 MG PO TABS
5.0000 mg | ORAL_TABLET | Freq: Every day | ORAL | Status: DC
  Administered 2013-03-18 – 2013-03-19 (×2): 5 mg via ORAL
  Filled 2013-03-18 (×2): qty 1

## 2013-03-18 MED ORDER — PRO-STAT SUGAR FREE PO LIQD
30.0000 mL | Freq: Two times a day (BID) | ORAL | Status: DC
  Administered 2013-03-18 – 2013-03-19 (×2): 30 mL via ORAL
  Filled 2013-03-18 (×3): qty 30

## 2013-03-18 MED ORDER — METOPROLOL TARTRATE 100 MG PO TABS
100.0000 mg | ORAL_TABLET | Freq: Two times a day (BID) | ORAL | Status: DC
  Administered 2013-03-18 – 2013-03-19 (×2): 100 mg via ORAL
  Filled 2013-03-18 (×3): qty 1

## 2013-03-18 MED ORDER — INSULIN ASPART 100 UNIT/ML ~~LOC~~ SOLN
0.0000 [IU] | Freq: Three times a day (TID) | SUBCUTANEOUS | Status: DC
  Administered 2013-03-19: 1 [IU] via SUBCUTANEOUS

## 2013-03-18 MED ORDER — HYDROCODONE-ACETAMINOPHEN 5-325 MG PO TABS
1.0000 | ORAL_TABLET | ORAL | Status: DC | PRN
  Administered 2013-03-18: 1 via ORAL
  Administered 2013-03-19: 2 via ORAL
  Filled 2013-03-18: qty 1
  Filled 2013-03-18 (×2): qty 2

## 2013-03-18 MED ORDER — ONDANSETRON HCL 4 MG PO TABS: 4.0000 mg | ORAL_TABLET | Freq: Four times a day (QID) | ORAL | Status: DC | PRN

## 2013-03-18 MED ORDER — PREDNISOLONE ACETATE 1 % OP SUSP
1.0000 [drp] | Freq: Four times a day (QID) | OPHTHALMIC | Status: DC
  Administered 2013-03-18 – 2013-03-19 (×4): 1 [drp] via OPHTHALMIC
  Filled 2013-03-18: qty 1

## 2013-03-18 SURGICAL SUPPLY — 48 items
BANDAGE ESMARK 6X9 LF (GAUZE/BANDAGES/DRESSINGS) ×1 IMPLANT
BLADE SAW RECIP 87.9 MT (BLADE) ×2 IMPLANT
BNDG COHESIVE 6X5 TAN STRL LF (GAUZE/BANDAGES/DRESSINGS) ×4 IMPLANT
BNDG ESMARK 6X9 LF (GAUZE/BANDAGES/DRESSINGS) ×2
BNDG GAUZE STRTCH 6 (GAUZE/BANDAGES/DRESSINGS) IMPLANT
CLOTH BEACON ORANGE TIMEOUT ST (SAFETY) ×2 IMPLANT
COVER SURGICAL LIGHT HANDLE (MISCELLANEOUS) ×2 IMPLANT
CUFF TOURNIQUET SINGLE 34IN LL (TOURNIQUET CUFF) IMPLANT
CUFF TOURNIQUET SINGLE 44IN (TOURNIQUET CUFF) IMPLANT
DRAIN PENROSE 1/2X12 LTX STRL (WOUND CARE) IMPLANT
DRAPE EXTREMITY T 121X128X90 (DRAPE) ×2 IMPLANT
DRAPE PROXIMA HALF (DRAPES) ×4 IMPLANT
DRAPE U-SHAPE 47X51 STRL (DRAPES) ×4 IMPLANT
DRSG ADAPTIC 3X8 NADH LF (GAUZE/BANDAGES/DRESSINGS) ×2 IMPLANT
DRSG PAD ABDOMINAL 8X10 ST (GAUZE/BANDAGES/DRESSINGS) ×2 IMPLANT
DURAPREP 26ML APPLICATOR (WOUND CARE) ×2 IMPLANT
ELECT CAUTERY BLADE 6.4 (BLADE) IMPLANT
ELECT REM PT RETURN 9FT ADLT (ELECTROSURGICAL) ×2
ELECTRODE REM PT RTRN 9FT ADLT (ELECTROSURGICAL) ×1 IMPLANT
EVACUATOR 1/8 PVC DRAIN (DRAIN) IMPLANT
GLOVE BIOGEL PI IND STRL 9 (GLOVE) ×1 IMPLANT
GLOVE BIOGEL PI INDICATOR 9 (GLOVE) ×1
GLOVE SURG ORTHO 9.0 STRL STRW (GLOVE) ×2 IMPLANT
GOWN PREVENTION PLUS XLARGE (GOWN DISPOSABLE) ×2 IMPLANT
GOWN STRL REIN XL XLG (GOWN DISPOSABLE) ×2 IMPLANT
KIT BASIN OR (CUSTOM PROCEDURE TRAY) ×2 IMPLANT
KIT ROOM TURNOVER OR (KITS) ×2 IMPLANT
MANIFOLD NEPTUNE II (INSTRUMENTS) ×2 IMPLANT
NS IRRIG 1000ML POUR BTL (IV SOLUTION) ×2 IMPLANT
PACK GENERAL/GYN (CUSTOM PROCEDURE TRAY) ×2 IMPLANT
PAD ARMBOARD 7.5X6 YLW CONV (MISCELLANEOUS) ×4 IMPLANT
PAD CAST 4YDX4 CTTN HI CHSV (CAST SUPPLIES) ×1 IMPLANT
PADDING CAST COTTON 4X4 STRL (CAST SUPPLIES) ×1
PADDING CAST COTTON 6X4 STRL (CAST SUPPLIES) ×2 IMPLANT
SPONGE GAUZE 4X4 12PLY (GAUZE/BANDAGES/DRESSINGS) ×2 IMPLANT
SPONGE LAP 18X18 X RAY DECT (DISPOSABLE) ×2 IMPLANT
STAPLER VISISTAT 35W (STAPLE) ×2 IMPLANT
STOCKINETTE IMPERVIOUS LG (DRAPES) ×2 IMPLANT
SUT PDS AB 1 CT  36 (SUTURE)
SUT PDS AB 1 CT 36 (SUTURE) IMPLANT
SUT SILK 2 0 (SUTURE) ×1
SUT SILK 2-0 18XBRD TIE 12 (SUTURE) ×1 IMPLANT
SWAB COLLECTION DEVICE MRSA (MISCELLANEOUS) IMPLANT
TAPE CLOTH SURG 6X10 WHT LF (GAUZE/BANDAGES/DRESSINGS) ×2 IMPLANT
TOWEL OR 17X24 6PK STRL BLUE (TOWEL DISPOSABLE) ×2 IMPLANT
TOWEL OR 17X26 10 PK STRL BLUE (TOWEL DISPOSABLE) ×2 IMPLANT
TUBE ANAEROBIC SPECIMEN COL (MISCELLANEOUS) IMPLANT
WATER STERILE IRR 1000ML POUR (IV SOLUTION) ×2 IMPLANT

## 2013-03-18 NOTE — Progress Notes (Signed)
Anesthesia aware of elevated INR>

## 2013-03-18 NOTE — Transfer of Care (Signed)
Immediate Anesthesia Transfer of Care Note  Patient: [name]  Procedure(s) Performed: Procedure(s): AMPUTATION ABOVE KNEE (Left)  Patient Location: PACU  Anesthesia Type:General  Level of Consciousness: sedated  Airway & Oxygen Therapy: Patient Spontanous Breathing and Patient connected to nasal cannula oxygen  Post-op Assessment: Report given to PACU RN and Post -op Vital signs reviewed and stable  Post vital signs: Reviewed and stable  Complications: No apparent anesthesia complications

## 2013-03-18 NOTE — Anesthesia Postprocedure Evaluation (Signed)
  Anesthesia Post-op Note  Patient: [name]  Procedure(s) Performed: Procedure(s): AMPUTATION ABOVE KNEE (Left)  Patient Location: PACU  Anesthesia Type:General  Level of Consciousness: awake, alert , sedated and patient cooperative  Airway and Oxygen Therapy: Patient Spontanous Breathing  Post-op Pain: mild  Post-op Assessment: Post-op Vital signs reviewed, Patient's Cardiovascular Status Stable, Respiratory Function Stable, Patent Airway, No signs of Nausea or vomiting and Pain level controlled  Post-op Vital Signs: stable  Complications: No apparent anesthesia complications

## 2013-03-18 NOTE — Anesthesia Preprocedure Evaluation (Signed)
Anesthesia Evaluation  Patient identified by MRN, date of birth, ID band Patient awake    Reviewed: Allergy & Precautions, H&P , NPO status , Patient's Chart, lab work & pertinent test results  Airway       Dental   Pulmonary sleep apnea ,          Cardiovascular hypertension, + Peripheral Vascular Disease     Neuro/Psych  Headaches, Anxiety Depression  Neuromuscular disease CVA    GI/Hepatic GERD-  ,  Endo/Other  diabetes, Type 2  Renal/GU Renal disease     Musculoskeletal   Abdominal   Peds  Hematology  (+) Blood dyscrasia, anemia ,   Anesthesia Other Findings   Reproductive/Obstetrics                           Anesthesia Physical Anesthesia Plan  ASA: III  Anesthesia Plan: General   Post-op Pain Management:    Induction: Intravenous  Airway Management Planned: Oral ETT  Additional Equipment:   Intra-op Plan:   Post-operative Plan: Extubation in OR  Informed Consent: I have reviewed the patients History and Physical, chart, labs and discussed the procedure including the risks, benefits and alternatives for the proposed anesthesia with the patient or authorized representative who has indicated his/her understanding and acceptance.     Plan Discussed with: CRNA, Anesthesiologist and Surgeon  Anesthesia Plan Comments:         Anesthesia Quick Evaluation

## 2013-03-18 NOTE — Op Note (Signed)
OPERATIVE REPORT  DATE OF SURGERY: 03/18/2013  PATIENT:  [name],  52 y.o. female  PRE-OPERATIVE DIAGNOSIS:  Decubitus Ulcer Lt Below Knee Amputation  POST-OPERATIVE DIAGNOSIS:  Decubitus Ulcer Lt Below Knee Amputation  PROCEDURE:  Procedure(s): AMPUTATION ABOVE KNEE  SURGEON:  Surgeon(s): Newt Minion, MD  ANESTHESIA:   general  EBL:  min ML  SPECIMEN:  Source of Specimen:  Left leg  TOURNIQUET:  * No tourniquets in log *  PROCEDURE DETAILS: Patient is a 52 year old woman with severe peripheral vascular disease who is status post bilateral transtibial amputations. Patient has persistent ulceration and necrotic tissue which has failed prolonged conservative therapy for the left transtibial amputation and presents at this time for above-the-knee amputation. Risks and benefits were discussed with the patient and her family including nonhealing of the wound need for higher level amputation. Patient and family state to understand and wish to proceed at this time. Description of procedure patient was brought to the operating room and underwent a general anesthetic. After adequate levels of anesthesia were obtained patient's left lower extremity was prepped using DuraPrep and draped into a sterile field. A fishmouth incision was made through the mid thigh. This was carried down to bone the bone was resected. The sciatic nerve was pulled cut and allowed to retract. The vascular bundle was suture ligated with 2-0 silk. The leg was amputated. Patient had very minimal petechial bleeding. The deep and superficial fascial layers were closed using #1 PDS. The skin was closed using staples. The wound was covered with Adaptic orthopedic sponges AB dressing and Hypafix tape. Patient was extubated taken to the PACU in stable condition plan for discharge back to skilled nursing.  PLAN OF CARE: Admit to inpatient   PATIENT DISPOSITION:  PACU - hemodynamically stable.   Newt Minion,  MD 03/18/2013 9:04 AM

## 2013-03-18 NOTE — Progress Notes (Signed)
Called [facility], spoke to nurse TU to verify last dose of home meds.

## 2013-03-18 NOTE — Anesthesia Procedure Notes (Signed)
Procedure Name: Intubation Date/Time: 03/18/2013 8:40 AM Performed by: Trixie Deis A Pre-anesthesia Checklist: Patient identified, Timeout performed, Emergency Drugs available, Suction available and Patient being monitored Patient Re-evaluated:Patient Re-evaluated prior to inductionOxygen Delivery Method: Circle system utilized Preoxygenation: Pre-oxygenation with 100% oxygen Intubation Type: IV induction Ventilation: Mask ventilation without difficulty and Oral airway inserted - appropriate to patient size Laryngoscope Size: Mac and 3 Grade View: Grade I Tube type: Oral Tube size: 7.0 mm Number of attempts: 1 Airway Equipment and Method: Stylet Placement Confirmation: ETT inserted through vocal cords under direct vision,  breath sounds checked- equal and bilateral and positive ETCO2 Secured at: 21 cm Tube secured with: Tape Dental Injury: Teeth and Oropharynx as per pre-operative assessment

## 2013-03-18 NOTE — Progress Notes (Signed)
ANTICOAGULATION CONSULT NOTE - Initial Consult  Pharmacy Consult for coumadin Indication: hx of PVD, stroke and cardiomyopathy  Allergies  Allergen Reactions  . Ace Inhibitors Shortness Of Breath  . Latex Hives  . Lisinopril Other (See Comments)    Per MAR  . Vancomycin Other (See Comments)    Per [facility]    Patient Measurements: Height:  (unable to stand) Weight: 208 lb (94.348 kg) Heparin Dosing Weight:   Vital Signs: Temp: 98 F (36.7 C) (10/08 0915) Temp src: Oral (10/08 0703) BP: 146/87 mmHg (10/08 1500) Pulse Rate: 72 (10/08 1500)  Labs:  Recent Labs  03/18/13 0708  HGB 9.9*  HCT 29.4*  PLT 405*  APTT 87*  LABPROT 34.9*  INR 3.65*  CREATININE 1.47*    The CrCl is unknown because both a height and weight (above a minimum accepted value) are required for this calculation.   Medical History: Past Medical History  Diagnosis Date  . HTN (hypertension)   . HLD (hyperlipidemia)   . History of methicillin resistant staphylococcus aureus (MRSA)   . CVA (cerebral infarction)   . Anemia   . Tachycardia   . Obesity   . Lower back pain     Chronic  . Idiopathic peripheral neuropathy     NOS  . DJD (degenerative joint disease)   . Headache(784.0)   . GERD (gastroesophageal reflux disease)   . Depression   . Anxiety   . Hypokalemia     resolved  . Cerebral infarct     left parietal and bilateral  . Esophagitis     distal  . Fluid overload     compensated  . Urinary retention   . H/O: pneumonia   . History of bacteremia   . Surgery, other elective     of the ear  . Stroke     Hx of left frontoparietal stroke in 12/11. History of right brain stroke.  . Cardiomyopathy     Echocardiogram 04/23/11: Mild LVH, EF 30-35%, mild MR, mild LAE, mild-moderate RVE.  TEE 04/25/11: Moderate LVH, severe biventricular dysfunction, EF 20-25%, moderate to severe MR, moderate TR, biatrial enlargement, no PFO, negative bubble study  . Shortness of breath     with  exertion   . Sleep apnea     no machine used  pt does not remember test for sleep apnea  . DM type 2 (diabetes mellitus, type 2)     type two   oral meds only   . 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 UTI (lower urinary tract infection) 10/19/11    had approx April 2013  . Osteomyelitis   . DJD (degenerative joint disease)   . Urinary retention   . Pneumonia   . Peripheral vascular disease     Medications:  Scheduled:  . amLODipine  5 mg Oral Daily  . ceFAZolin      .  ceFAZolin (ANCEF) IV  2 g Intravenous Q6H  . cycloSPORINE  1 drop Both Eyes Custom  . feeding supplement (PRO-STAT SUGAR FREE 64)  30 mL Oral BID  . ferrous sulfate  325 mg Oral BID  . furosemide  20 mg Oral BID  . gabapentin  600 mg Oral QID  . gatifloxacin  1 drop Left Eye QID  . insulin aspart  0-20 Units Subcutaneous TID WC  . insulin aspart  6 Units Subcutaneous TID WC  . levETIRAcetam  500 mg Oral BID  .  methylphenidate  5 mg Oral Daily  . metoprolol  100 mg Oral BID  . polyvinyl alcohol  1 drop Both Eyes TID  . prednisoLONE acetate  1 drop Left Eye QID  . sertraline  150 mg Oral Daily  . simvastatin  20 mg Oral QHS  . Warfarin - Pharmacist Dosing Inpatient   Does not apply q1800   Infusions:  . sodium chloride      Assessment: 52 yo female with hx of PVD, stroke and cardiomyopathy will be resumed on coumadin s/p BKA.  INR, however, is 3.65 today.  Patient was on coumadin 6mg  po daily prior to admission. Goal of Therapy:  INR 2-3 Monitor platelets by anticoagulation protocol: Yes   Plan:  1) No coumadin tonight 2) INR in am  Zakee Deerman, Tsz-Yin 03/18/2013,3:34 PM

## 2013-03-18 NOTE — H&P (Signed)
[name] is an 52 y.o. female.   Chief Complaint: Ulceration and gangrene left transtibial amputation. HPI:  Patient is a 52 year old woman with severe peripheral vascular disease completely dependent who has developed ulceration with necrosis left transtibial amputation. She has failed conservative treatment and despite conservative wound care and progression of the ulceration patient presents at this time for above-the-knee amputation.  Past Medical History  Diagnosis Date  . HTN (hypertension)   . HLD (hyperlipidemia)   . History of methicillin resistant staphylococcus aureus (MRSA)   . CVA (cerebral infarction)   . Anemia   . Tachycardia   . Obesity   . Lower back pain     Chronic  . Idiopathic peripheral neuropathy     NOS  . DJD (degenerative joint disease)   . Headache(784.0)   . GERD (gastroesophageal reflux disease)   . Depression   . Anxiety   . Hypokalemia     resolved  . Cerebral infarct     left parietal and bilateral  . Esophagitis     distal  . Fluid overload     compensated  . Urinary retention   . H/O: pneumonia   . History of bacteremia   . Surgery, other elective     of the ear  . Stroke     Hx of left frontoparietal stroke in 12/11. History of right brain stroke.  . Cardiomyopathy     Echocardiogram 04/23/11: Mild LVH, EF 30-35%, mild MR, mild LAE, mild-moderate RVE.  TEE 04/25/11: Moderate LVH, severe biventricular dysfunction, EF 20-25%, moderate to severe MR, moderate TR, biatrial enlargement, no PFO, negative bubble study  . Shortness of breath     with exertion   . Sleep apnea     no machine used  pt does not remember test for sleep apnea  . DM type 2 (diabetes mellitus, type 2)     type two   oral meds only   . 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 UTI (lower urinary tract infection) 10/19/11    had approx April 2013  . Osteomyelitis   . DJD (degenerative joint disease)   . Urinary retention   . Pneumonia   . Peripheral  vascular disease     Past Surgical History  Procedure Laterality Date  . Tympanoplasty    . Toe amputation      left 2nd toe  . Dilation and curettage of uterus      history  . Tee without cardioversion  04/25/2011    Procedure: TRANSESOPHAGEAL ECHOCARDIOGRAM (TEE);  Surgeon: Jolaine Artist, MD;  Location: [facility] ENDOSCOPY;  Service: Cardiovascular;  Laterality: N/A;  . Esophagogastroduodenoscopy  08/14/2011    Procedure: ESOPHAGOGASTRODUODENOSCOPY (EGD);  Surgeon: Scarlette Shorts, MD;  Location: [facility] ENDOSCOPY;  Service: Endoscopy;  Laterality: N/A;  . Amputation  01/18/2012    Procedure: AMPUTATION BELOW KNEE;  Surgeon: Newt Minion, MD;  Location: [place];  Service: Orthopedics;  Laterality: Left;  Left Below Knee Amputation  . Amputation Right 08/01/2012    Procedure: Right Below Knee Amputation;  Surgeon: Newt Minion, MD;  Location: [place];  Service: Orthopedics;  Laterality: Right;  Right Below Knee Amputation  . Pars plana vitrectomy Left 01/28/2013    Procedure: PARS PLANA VITRECTOMY WITH 23 GAUGE;  Surgeon: Adonis Brook, MD;  Location: [place];  Service: Ophthalmology;  Laterality: Left;  . Photocoagulation with laser Left 01/28/2013    Procedure: PHOTOCOAGULATION WITH LASER;  Surgeon: Adonis Brook, MD;  Location: [place];  Service: Ophthalmology;  Laterality: Left;  ENDOLASER  . Membrane peel Left 01/28/2013    Procedure: MEMBRANE PEEL;  Surgeon: Adonis Brook, MD;  Location: [place];  Service: Ophthalmology;  Laterality: Left;    Family History  Problem Relation Age of Onset  . Coronary artery disease    . Diabetes    . Cancer Mother   . Diabetes Mother   . Heart disease Father   . Hyperlipidemia Father   . Hypertension Father   . Stroke Father   . Heart disease Brother   . Anesthesia problems Neg Hx   . Hypotension Neg Hx   . Malignant hyperthermia Neg Hx   . Pseudochol deficiency Neg Hx    Social History:  reports that she has never smoked. She has never used smokeless tobacco. She  reports that she drinks alcohol. She reports that she does not use illicit drugs.  Allergies:  Allergies  Allergen Reactions  . Ace Inhibitors Shortness Of Breath  . Latex Hives  . Lisinopril Other (See Comments)    Per MAR  . Vancomycin Other (See Comments)    Per MAR    Medications Prior to Admission  Medication Sig Dispense Refill  . amLODipine (NORVASC) 5 MG tablet Take 5 mg by mouth daily.      Marland Kitchen azithromycin (ZITHROMAX) 250 MG tablet Take 250-500 mg by mouth daily. 5 day course started 03/11/13 - take 2 tablets on day 1, then take 1 tablet daily on days 2-5      . chlorhexidine (PERIDEX) 0.12 % solution Use as directed 15 mL in the mouth or throat 2 (two) times daily. Swish for 30 seconds      . cycloSPORINE (RESTASIS) 0.05 % ophthalmic emulsion Place 1 drop into both eyes 2 (two) times daily. 8am and 4pm      . feeding supplement (PRO-STAT SUGAR FREE 64) LIQD Take 30 mLs by mouth 2 (two) times daily.      . ferrous sulfate 325 (65 FE) MG tablet Take 325 mg by mouth 2 (two) times daily.       . furosemide (LASIX) 20 MG tablet Take 20 mg by mouth 2 (two) times daily.      Marland Kitchen gabapentin (NEURONTIN) 300 MG capsule Take 600 mg by mouth 4 (four) times daily.      Marland Kitchen gatifloxacin (ZYMAXID) 0.5 % SOLN Place 1 drop into the left eye 4 (four) times daily.      Marland Kitchen HYDROcodone-acetaminophen (NORCO/VICODIN) 5-325 MG per tablet Take 1 tablet by mouth every 6 (six) hours as needed for pain.      Marland Kitchen insulin aspart (NOVOLOG) 100 UNIT/ML injection Inject 5 Units into the skin 4 (four) times daily -  before meals and at bedtime. 5 units for blood sugar>150      . levETIRAcetam (KEPPRA) 500 MG tablet Take 500 mg by mouth 2 (two) times daily.      . methylphenidate (RITALIN) 5 MG tablet Take 5 mg by mouth daily.      . metoprolol (LOPRESSOR) 100 MG tablet Take 100 mg by mouth 2 (two) times daily.      . Multiple Vitamin (MULTIVITAMIN WITH MINERALS) TABS tablet Take 2 tablets by mouth daily. Thera-M  substitute for Centrum Silver      . Nutritional Supplements (RESOURCE ARGINAID) PACK Take 1 each by mouth 2 (two) times daily. Mix with 8 ounces of fluid and drink      .  oxyCODONE-acetaminophen (PERCOCET/ROXICET) 5-325 MG per tablet Take 1 tablet by mouth every 4 (four) hours as needed for pain.      Vladimir Faster Glycol-Propyl Glycol (SYSTANE) 0.4-0.3 % GEL Place 1 drop into both eyes 3 (three) times daily.      . prednisoLONE acetate (PRED FORTE) 1 % ophthalmic suspension Place 1 drop into the left eye 4 (four) times daily.      . sertraline (ZOLOFT) 50 MG tablet Take 150 mg by mouth daily.      . simvastatin (ZOCOR) 20 MG tablet Take 20 mg by mouth at bedtime.      Marland Kitchen warfarin (COUMADIN) 6 MG tablet Take 6 mg by mouth at bedtime.        No results found for this or any previous visit (from the past 48 hour(s)). No results found.  Review of Systems  All other systems reviewed and are negative.    There were no vitals taken for this visit. Physical Exam  On examination patient has a large 6 cm in diameter ulcer over the lateral aspect of the left transtibial amputation. There is necrotic tissue nonviable tissue. Assessment/Plan Assessment: Ulceration and necrotic left transtibial amputation.  Plan: Due to failure conservative care patient presents at this time for above-the-knee amputation. Risks and benefits were discussed including persistent infection nonhealing of the wound need for additional surgery. Patient states she understands and wished to proceed at this time.  Dinorah Masullo V 03/18/2013, 6:43 AM

## 2013-03-18 NOTE — Progress Notes (Signed)
While administering 2200 medications pt began to stare and began Jerking this lasted approx 58mins after pt was alert and oriented times 4 she requested something to drink bp is charted in epic suction was set up in room she is on 2l o2 she did receive keppra which was scheduled at that time. Arthor Captain LPN

## 2013-03-18 NOTE — Progress Notes (Signed)
MD on call notified that pt had a 2 min seizure and that 500mg  of keppra was given and that the pt is stable.Arthor Captain LPN

## 2013-03-19 ENCOUNTER — Inpatient Hospital Stay (HOSPITAL_COMMUNITY): Payer: Medicare Other

## 2013-03-19 DIAGNOSIS — I428 Other cardiomyopathies: Secondary | ICD-10-CM

## 2013-03-19 DIAGNOSIS — E119 Type 2 diabetes mellitus without complications: Secondary | ICD-10-CM

## 2013-03-19 DIAGNOSIS — Z87898 Personal history of other specified conditions: Secondary | ICD-10-CM | POA: Diagnosis present

## 2013-03-19 DIAGNOSIS — R569 Unspecified convulsions: Secondary | ICD-10-CM

## 2013-03-19 LAB — GLUCOSE, CAPILLARY
Glucose-Capillary: 118 mg/dL — ABNORMAL HIGH (ref 70–99)
Glucose-Capillary: 122 mg/dL — ABNORMAL HIGH (ref 70–99)

## 2013-03-19 LAB — CBC WITH DIFFERENTIAL/PLATELET
Basophils Absolute: 0 10*3/uL (ref 0.0–0.1)
HCT: 26.4 % — ABNORMAL LOW (ref 36.0–46.0)
Lymphocytes Relative: 9 % — ABNORMAL LOW (ref 12–46)
MCHC: 33 g/dL (ref 30.0–36.0)
Neutro Abs: 10.2 10*3/uL — ABNORMAL HIGH (ref 1.7–7.7)
Platelets: 371 10*3/uL (ref 150–400)
RBC: 2.89 MIL/uL — ABNORMAL LOW (ref 3.87–5.11)
RDW: 13.5 % (ref 11.5–15.5)
WBC: 12.4 10*3/uL — ABNORMAL HIGH (ref 4.0–10.5)

## 2013-03-19 LAB — COMPREHENSIVE METABOLIC PANEL
Albumin: 2.3 g/dL — ABNORMAL LOW (ref 3.5–5.2)
Alkaline Phosphatase: 99 U/L (ref 39–117)
BUN: 51 mg/dL — ABNORMAL HIGH (ref 6–23)
Chloride: 110 mEq/L (ref 96–112)
GFR calc Af Amer: 49 mL/min — ABNORMAL LOW (ref >90)
Glucose, Bld: 123 mg/dL — ABNORMAL HIGH (ref 70–99)
Potassium: 4.9 mEq/L (ref 3.5–5.1)
Total Bilirubin: 0.1 mg/dL — ABNORMAL LOW (ref 0.3–1.2)
Total Protein: 6.5 g/dL (ref 6.0–8.3)

## 2013-03-19 LAB — PROTIME-INR: INR: 3.89 — ABNORMAL HIGH (ref 0.00–1.49)

## 2013-03-19 LAB — MAGNESIUM: Magnesium: 2.3 mg/dL (ref 1.5–2.5)

## 2013-03-19 MED ORDER — SODIUM CHLORIDE 0.9 % IV SOLN
1000.0000 mg | Freq: Once | INTRAVENOUS | Status: AC
  Administered 2013-03-19: 1000 mg via INTRAVENOUS
  Filled 2013-03-19: qty 10

## 2013-03-19 MED ORDER — SODIUM CHLORIDE 0.9 % IV SOLN
750.0000 mg | Freq: Two times a day (BID) | INTRAVENOUS | Status: DC
  Administered 2013-03-19: 750 mg via INTRAVENOUS
  Filled 2013-03-19 (×2): qty 7.5

## 2013-03-19 MED ORDER — LEVETIRACETAM 750 MG PO TABS: 750.0000 mg | ORAL_TABLET | Freq: Two times a day (BID) | ORAL | Status: DC

## 2013-03-19 MED ORDER — LEVETIRACETAM 750 MG PO TABS
750.0000 mg | ORAL_TABLET | Freq: Two times a day (BID) | ORAL | Status: DC
  Filled 2013-03-19: qty 1

## 2013-03-19 NOTE — Discharge Summary (Signed)
Physician Discharge Summary  Patient ID: [name] MRN: [id number] DOB/AGE: [birth year] 52 y.o.  Admit date: 03/18/2013 Discharge date: 03/19/2013  Admission Diagnoses: Gangrene left leg  Discharge Diagnoses: Gangrene left leg Active Problems:   Seizures   Discharged Condition: stable  Hospital Course: Patient's hospital course was essentially unremarkable. She underwent revision of her gangrenous transtibial amputation to an above-the-knee amputation. Patient tolerated this well and was discharged back to skilled nursing.  Consults: None  Significant Diagnostic Studies: labs: Routine labs  Treatments: surgery: See operative note  Discharge Exam: Blood pressure 167/97, pulse 72, temperature 98.1 F (36.7 C), temperature source Oral, resp. rate 20, height 0' (0 m), weight 94.348 kg (208 lb), SpO2 98.00%. Incision/Wound: dressing clean dry and intact  Disposition: 63-Long Term Care  Discharge Orders   Future Orders Complete By Expires   Call MD / Call 911  As directed    Comments:     If you experience chest pain or shortness of breath, CALL 911 and be transported to the hospital emergency room.  If you develope a fever above 101 F, pus (white drainage) or increased drainage or redness at the wound, or calf pain, call your surgeon's office.   Constipation Prevention  As directed    Comments:     Drink plenty of fluids.  Prune juice may be helpful.  You may use a stool softener, such as Colace (over the counter) 100 mg twice a day.  Use MiraLax (over the counter) for constipation as needed.   Diet - low sodium heart healthy  As directed    Increase activity slowly as tolerated  As directed        Medication List         amLODipine 5 MG tablet  Commonly known as:  NORVASC  Take 5 mg by mouth daily.     azithromycin 250 MG tablet  Commonly known as:  ZITHROMAX  Take 250-500 mg by mouth daily. 5 day course started 03/11/13 - take 2 tablets on day 1, then take 1 tablet  daily on days 2-5     chlorhexidine 0.12 % solution  Commonly known as:  PERIDEX  Use as directed 15 mL in the mouth or throat 2 (two) times daily. Swish for 30 seconds     cycloSPORINE 0.05 % ophthalmic emulsion  Commonly known as:  RESTASIS  Place 1 drop into both eyes 2 (two) times daily. 8am and 4pm     feeding supplement (PRO-STAT SUGAR FREE 64) Liqd  Take 30 mLs by mouth 2 (two) times daily.     ferrous sulfate 325 (65 FE) MG tablet  Take 325 mg by mouth 2 (two) times daily.     furosemide 20 MG tablet  Commonly known as:  LASIX  Take 20 mg by mouth 2 (two) times daily.     gabapentin 300 MG capsule  Commonly known as:  NEURONTIN  Take 600 mg by mouth 4 (four) times daily.     gatifloxacin 0.5 % Soln  Commonly known as:  ZYMAXID  Place 1 drop into the left eye 4 (four) times daily.     HYDROcodone-acetaminophen 5-325 MG per tablet  Commonly known as:  NORCO/VICODIN  Take 1 tablet by mouth every 6 (six) hours as needed for pain.     insulin aspart 100 UNIT/ML injection  Commonly known as:  novoLOG  Inject 5 Units into the skin 4 (four) times daily -  before meals and at bedtime. 5 units  for blood sugar>150     levETIRAcetam 500 MG tablet  Commonly known as:  KEPPRA  Take 500 mg by mouth 2 (two) times daily.     methylphenidate 5 MG tablet  Commonly known as:  RITALIN  Take 5 mg by mouth daily.     metoprolol 100 MG tablet  Commonly known as:  LOPRESSOR  Take 100 mg by mouth 2 (two) times daily.     multivitamin with minerals Tabs tablet  Take 2 tablets by mouth daily. Thera-M substitute for Centrum Silver     oxyCODONE-acetaminophen 5-325 MG per tablet  Commonly known as:  PERCOCET/ROXICET  Take 1 tablet by mouth every 4 (four) hours as needed for pain.     prednisoLONE acetate 1 % ophthalmic suspension  Commonly known as:  PRED FORTE  Place 1 drop into the left eye 4 (four) times daily.     RESOURCE ARGINAID Pack  Take 1 each by mouth 2 (two) times  daily. Mix with 8 ounces of fluid and drink     sertraline 50 MG tablet  Commonly known as:  ZOLOFT  Take 150 mg by mouth daily.     simvastatin 20 MG tablet  Commonly known as:  ZOCOR  Take 20 mg by mouth at bedtime.     SYSTANE 0.4-0.3 % Gel  Generic drug:  Polyethyl Glycol-Propyl Glycol  Place 1 drop into both eyes 3 (three) times daily.     warfarin 6 MG tablet  Commonly known as:  COUMADIN  Take 6 mg by mouth at bedtime.           Follow-up Information   Follow up with Gerell Fortson V, MD In 2 weeks.   Specialty:  Orthopedic Surgery   Contact information:   [place] [address] [phone]       Signed: Newt Minion 03/19/2013, 6:11 AM

## 2013-03-19 NOTE — Progress Notes (Signed)
52 year old patient of Dr. Sharol Given. Nursing noted a 2 min period of petite mal seizure activity. She is on Keppra and was given her evening dose. She has a history of CVA, cardiomyopathy, diabetes and renal insuff. On coumadin and insulin, Blood glucose was 92. Will ask for a triad hospitalist consult. Dr. Sharol Given plans for her to return to SNF

## 2013-03-19 NOTE — Progress Notes (Signed)
Report called to RN at [facility]. Patient discharged to facility. IV removed. Patient left via EMS in a stable condition.

## 2013-03-19 NOTE — Progress Notes (Signed)
UR COMPLETED  

## 2013-03-19 NOTE — Progress Notes (Signed)
ANTICOAGULATION CONSULT NOTE - follow up Pharmacy Consult for coumadin Indication: hx of PVD, stroke and cardiomyopathy  Allergies  Allergen Reactions  . Ace Inhibitors Shortness Of Breath  . Latex Hives  . Lisinopril Other (See Comments)    Per MAR  . Vancomycin Other (See Comments)    Per [facility]    Patient Measurements: Height:  (unable to stand) Weight: 208 lb (94.348 kg) Heparin Dosing Weight:   Vital Signs: Temp: 98.4 F (36.9 C) (10/09 0548) Temp src: Oral (10/09 0548) BP: 156/94 mmHg (10/09 0548) Pulse Rate: 87 (10/09 0548)  Labs:  Recent Labs  03/18/13 0708 03/19/13 0616 03/19/13 0656  HGB 9.9* 8.7*  --   HCT 29.4* 26.4*  --   PLT 405* 371  --   APTT 87*  --   --   LABPROT 34.9*  --  36.7*  INR 3.65*  --  3.89*  CREATININE 1.47* 1.43*  --     The CrCl is unknown because both a height and weight (above a minimum accepted value) are required for this calculation.   Assessment: 52 yo female with hx of PVD, stroke and cardiomyopathy will be resumed on coumadin s/p BKA.  INR was 3.65 yesterday and coumadin was held.  INR is 3.89 today.  Patient was on coumadin 6mg  po daily prior to admission.  No bleeding reported.    Goal of Therapy:  INR 2-3   Plan:  1) No coumadin again tonight - RN informed that coumadin should be held at [facility] as well, to be addressed with [facility] MD 2) INR in am 3) I have changed IV keppra 750 bid to PO keppra 750 mg po bid 2nd she has orders to DC to SNF today Eudelia Bunch, Pharm.D. [phone] 03/19/2013 10:53 AM

## 2013-03-19 NOTE — Progress Notes (Signed)
Spoke with family regarding patients status and recent seizures. Family expressed concerns. Paged Dr. Daleen Bo regarding family's concerns about patient being discharged today. Dr. Daleen Bo aware and will address with family. Will continue to monitor.

## 2013-03-19 NOTE — Progress Notes (Signed)
NT infformed that pt had a 1-2 min seizure will continue to monitor. Arthor Captain LPN

## 2013-03-19 NOTE — Consult Note (Signed)
Reason for Consult: Seizures and medical management. Referring Physician: Dr. Louanne Skye.  [name] is an 52 y.o. female.  HPI: With known history of seizures on Keppra, cardioembolic CVA with left-sided hemiparesis on Coumadin, cardiomyopathy, peripheral vascular disease who has had a left AKA yesterday was found to have a seizure around 9:30 last night. Patient also had a seizure an hour ago. As per the nurse patient had a seizure lasting for almost 2 minutes generalized tonic-clonic. Patient does not recall the incident. Patient did not have any loss of consciousness after the incident and did not have any tongue bite or incontinence of urine. After the first episode patient was given her regular dose of Keppra. On my exam patient is alert awake oriented to time place and person. Patient has no history of left-sided hemiplegia from previous stroke. Denies any chest pain or shortness of breath.  Past Medical History  Diagnosis Date  . HTN (hypertension)   . HLD (hyperlipidemia)   . History of methicillin resistant staphylococcus aureus (MRSA)   . CVA (cerebral infarction)   . Anemia   . Tachycardia   . Obesity   . Lower back pain     Chronic  . Idiopathic peripheral neuropathy     NOS  . DJD (degenerative joint disease)   . Headache(784.0)   . GERD (gastroesophageal reflux disease)   . Depression   . Anxiety   . Hypokalemia     resolved  . Cerebral infarct     left parietal and bilateral  . Esophagitis     distal  . Fluid overload     compensated  . Urinary retention   . H/O: pneumonia   . History of bacteremia   . Surgery, other elective     of the ear  . Stroke     Hx of left frontoparietal stroke in 12/11. History of right brain stroke.  . Cardiomyopathy     Echocardiogram 04/23/11: Mild LVH, EF 30-35%, mild MR, mild LAE, mild-moderate RVE.  TEE 04/25/11: Moderate LVH, severe biventricular dysfunction, EF 20-25%, moderate to severe MR, moderate TR, biatrial enlargement,  no PFO, negative bubble study  . Shortness of breath     with exertion   . Sleep apnea     no machine used  pt does not remember test for sleep apnea  . DM type 2 (diabetes mellitus, type 2)     type two   oral meds only   . 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 UTI (lower urinary tract infection) 10/19/11    had approx April 2013  . Osteomyelitis   . DJD (degenerative joint disease)   . Urinary retention   . Pneumonia   . Peripheral vascular disease     Past Surgical History  Procedure Laterality Date  . Tympanoplasty    . Toe amputation      left 2nd toe  . Dilation and curettage of uterus      history  . Tee without cardioversion  04/25/2011    Procedure: TRANSESOPHAGEAL ECHOCARDIOGRAM (TEE);  Surgeon: Jolaine Artist, MD;  Location: [facility] ENDOSCOPY;  Service: Cardiovascular;  Laterality: N/A;  . Esophagogastroduodenoscopy  08/14/2011    Procedure: ESOPHAGOGASTRODUODENOSCOPY (EGD);  Surgeon: Scarlette Shorts, MD;  Location: [facility] ENDOSCOPY;  Service: Endoscopy;  Laterality: N/A;  . Amputation  01/18/2012    Procedure: AMPUTATION BELOW KNEE;  Surgeon: Newt Minion, MD;  Location: [place];  Service: Orthopedics;  Laterality: Left;  Left Below Knee Amputation  . Amputation Right 08/01/2012    Procedure: Right Below Knee Amputation;  Surgeon: Newt Minion, MD;  Location: [place];  Service: Orthopedics;  Laterality: Right;  Right Below Knee Amputation  . Pars plana vitrectomy Left 01/28/2013    Procedure: PARS PLANA VITRECTOMY WITH 23 GAUGE;  Surgeon: Adonis Brook, MD;  Location: [place];  Service: Ophthalmology;  Laterality: Left;  . Photocoagulation with laser Left 01/28/2013    Procedure: PHOTOCOAGULATION WITH LASER;  Surgeon: Adonis Brook, MD;  Location: [place];  Service: Ophthalmology;  Laterality: Left;  ENDOLASER  . Membrane peel Left 01/28/2013    Procedure: MEMBRANE PEEL;  Surgeon: Adonis Brook, MD;  Location: [place];  Service: Ophthalmology;  Laterality: Left;    Family History   Problem Relation Age of Onset  . Coronary artery disease    . Diabetes    . Cancer Mother   . Diabetes Mother   . Heart disease Father   . Hyperlipidemia Father   . Hypertension Father   . Stroke Father   . Heart disease Brother   . Anesthesia problems Neg Hx   . Hypotension Neg Hx   . Malignant hyperthermia Neg Hx   . Pseudochol deficiency Neg Hx     Social History:  reports that she has never smoked. She has never used smokeless tobacco. She reports that she drinks alcohol. She reports that she does not use illicit drugs.  Allergies:  Allergies  Allergen Reactions  . Ace Inhibitors Shortness Of Breath  . Latex Hives  . Lisinopril Other (See Comments)    Per MAR  . Vancomycin Other (See Comments)    Per MAR    Medications: I have reviewed the patient's current medications.  Results for orders placed during the hospital encounter of 03/18/13 (from the past 48 hour(s))  APTT     Status: Abnormal   Collection Time    03/18/13  7:08 AM      Result Value Range   aPTT 87 (*) 24 - 37 seconds   Comment:            IF BASELINE aPTT IS ELEVATED,     SUGGEST PATIENT RISK ASSESSMENT     BE USED TO DETERMINE APPROPRIATE     ANTICOAGULANT THERAPY.  CBC     Status: Abnormal   Collection Time    03/18/13  7:08 AM      Result Value Range   WBC 8.2  4.0 - 10.5 K/uL   RBC 3.24 (*) 3.87 - 5.11 MIL/uL   Hemoglobin 9.9 (*) 12.0 - 15.0 g/dL   HCT 29.4 (*) 36.0 - 46.0 %   MCV 90.7  78.0 - 100.0 fL   MCH 30.6  26.0 - 34.0 pg   MCHC 33.7  30.0 - 36.0 g/dL   RDW 13.7  11.5 - 15.5 %   Platelets 405 (*) 150 - 400 K/uL  COMPREHENSIVE METABOLIC PANEL     Status: Abnormal   Collection Time    03/18/13  7:08 AM      Result Value Range   Sodium 139  135 - 145 mEq/L   Potassium 4.7  3.5 - 5.1 mEq/L   Chloride 110  96 - 112 mEq/L   CO2 20  19 - 32 mEq/L   Glucose, Bld 98  70 - 99 mg/dL   BUN 50 (*) 6 - 23 mg/dL   Creatinine, Ser 1.47 (*) 0.50 - 1.10 mg/dL  Calcium 8.4  8.4 - 10.5  mg/dL   Total Protein 6.9  6.0 - 8.3 g/dL   Albumin 2.3 (*) 3.5 - 5.2 g/dL   AST 19  0 - 37 U/L   ALT 15  0 - 35 U/L   Alkaline Phosphatase 109  39 - 117 U/L   Total Bilirubin 0.2 (*) 0.3 - 1.2 mg/dL   GFR calc non Af Amer 41 (*) >90 mL/min   GFR calc Af Amer 47 (*) >90 mL/min   Comment: (NOTE)     The eGFR has been calculated using the CKD EPI equation.     This calculation has not been validated in all clinical situations.     eGFR's persistently <90 mL/min signify possible Chronic Kidney     Disease.  PROTIME-INR     Status: Abnormal   Collection Time    03/18/13  7:08 AM      Result Value Range   Prothrombin Time 34.9 (*) 11.6 - 15.2 seconds   INR 3.65 (*) 0.00 - 1.49  GLUCOSE, CAPILLARY     Status: None   Collection Time    03/18/13  7:15 AM      Result Value Range   Glucose-Capillary 93  70 - 99 mg/dL  GLUCOSE, CAPILLARY     Status: Abnormal   Collection Time    03/18/13  9:20 AM      Result Value Range   Glucose-Capillary 119 (*) 70 - 99 mg/dL  GLUCOSE, CAPILLARY     Status: None   Collection Time    03/18/13  4:15 PM      Result Value Range   Glucose-Capillary 94  70 - 99 mg/dL  GLUCOSE, CAPILLARY     Status: None   Collection Time    03/18/13 11:10 PM      Result Value Range   Glucose-Capillary 99  70 - 99 mg/dL  GLUCOSE, CAPILLARY     Status: Abnormal   Collection Time    03/19/13  3:15 AM      Result Value Range   Glucose-Capillary 109 (*) 70 - 99 mg/dL    No results found.  Review of Systems  Constitutional: Negative.   HENT: Negative.   Eyes: Negative.   Respiratory: Negative.   Cardiovascular: Negative.   Gastrointestinal: Negative.   Genitourinary: Negative.   Musculoskeletal: Negative.   Skin: Negative.   Neurological: Positive for seizures.  Endo/Heme/Allergies: Negative.   Psychiatric/Behavioral: Negative.    Blood pressure 167/97, pulse 72, temperature 98.1 F (36.7 C), temperature source Oral, resp. rate 20, height 0' (0 m), weight  94.348 kg (208 lb), SpO2 98.00%. Physical Exam  Constitutional: She is oriented to person, place, and time. She appears well-developed and well-nourished. No distress.  HENT:  Head: Normocephalic and atraumatic.  Eyes: Conjunctivae are normal. Pupils are equal, round, and reactive to light. Right eye exhibits no discharge. Left eye exhibits no discharge. No scleral icterus.  Neck: Normal range of motion. Neck supple.  Cardiovascular: Normal rate and regular rhythm.   Respiratory: Effort normal and breath sounds normal. No respiratory distress. She has no wheezes. She has no rales.  GI: Soft. Bowel sounds are normal. She exhibits no distension. There is no tenderness. There is no rebound.  Musculoskeletal:  Left leg dressing.  Neurological: She is alert and oriented to person, place, and time.  Left sided weakness.  Skin: Skin is warm and dry. She is not diaphoretic.  Psychiatric: Her behavior is normal.  Assessment/Plan: #1. Seizures - have discussed with on-call urologist Dr. Doy Mince who has advised to give a loading dose of Keppra 1 g IV and increase her regular dose of Keppra from 500 mg to 750 mg IV every 12. CT head has been ordered. Repeat labs have been ordered and I have asked patient's nurse to get a CBG stat. #2. History of cardioembolic CVA - on Coumadin and is therapeutic. CT head has been ordered. #3. History of cardiomyopathy with last EF measured was 35% - presently looks compensated. #4. History of diabetes mellitus type 2 - closely follow CBGs. #5. Hyperlipidemia - continue statins. #6. History of hypertension - continue present medications. #7. Left AKA - per orthopedics. #8. Chronic kidney disease - closely follow Metabolic panel. #9. Anemia - follow CBC.  Thanks for involving [place] in patient's care we'll follow along with you.  Mycala Warshawsky N. 03/19/2013, 3:54 AM

## 2013-03-20 ENCOUNTER — Encounter (HOSPITAL_COMMUNITY): Payer: Self-pay | Admitting: Orthopedic Surgery

## 2013-08-06 ENCOUNTER — Ambulatory Visit: Payer: Medicare Other | Admitting: Neurology

## 2013-08-14 ENCOUNTER — Ambulatory Visit (INDEPENDENT_AMBULATORY_CARE_PROVIDER_SITE_OTHER): Payer: Medicare Other | Admitting: Neurology

## 2013-08-14 ENCOUNTER — Encounter: Payer: Self-pay | Admitting: Neurology

## 2013-08-14 VITALS — BP 102/66 | HR 66 | Resp 16 | Ht 67.0 in | Wt 208.0 lb

## 2013-08-14 DIAGNOSIS — F3289 Other specified depressive episodes: Secondary | ICD-10-CM

## 2013-08-14 DIAGNOSIS — F329 Major depressive disorder, single episode, unspecified: Secondary | ICD-10-CM

## 2013-08-14 DIAGNOSIS — F32A Depression, unspecified: Secondary | ICD-10-CM

## 2013-08-14 DIAGNOSIS — I679 Cerebrovascular disease, unspecified: Secondary | ICD-10-CM

## 2013-08-14 DIAGNOSIS — R569 Unspecified convulsions: Secondary | ICD-10-CM

## 2013-08-14 DIAGNOSIS — G547 Phantom limb syndrome without pain: Secondary | ICD-10-CM

## 2013-08-14 DIAGNOSIS — G3184 Mild cognitive impairment, so stated: Secondary | ICD-10-CM

## 2013-08-14 DIAGNOSIS — G546 Phantom limb syndrome with pain: Secondary | ICD-10-CM

## 2013-08-14 DIAGNOSIS — Z8673 Personal history of transient ischemic attack (TIA), and cerebral infarction without residual deficits: Secondary | ICD-10-CM

## 2013-08-14 MED ORDER — PREGABALIN 75 MG PO CAPS: 75.0000 mg | ORAL_CAPSULE | Freq: Two times a day (BID) | ORAL | Status: DC

## 2013-08-14 NOTE — Patient Instructions (Addendum)
1.  I would continue the Keppra 500mg  twice daily 2.  For the phantom limb pain, we will start Lyrica 75mg  twice daily.  I provided samples.  Continue the gabapentin 3.  Refer to behavioral health for depression and anxiety. We have you scheduled with Dr Adele Schilder at [facility] for 09/24/13 at 3:00 pm to arrive at 2:30. If you need to reschedule please call [phone]. 4.  Check fasting lipid panel. Please take your sheet to [facility] lab when you have been fasting.  5.  Follow up in 3 months.

## 2013-08-14 NOTE — Progress Notes (Signed)
NEUROLOGY FOLLOW UP OFFICE NOTE  [name] [id number]  HISTORY OF PRESENT ILLNESS: [name] is a 53 year old female with history of type II diabetes, anxiety, right MCA stroke, cardiomyopathy and mural thrombus on [facility], who follows up for cognitive deficits and seizure.  Records and images personally reviewed where available.  She has residual left-sided plegia from a cardio-embolic stroke sustained in November 2012.  She has bilateral BTK amputations, due to osteomyelitis as a complication of diabetes. She is accompanied by her sister.  UPDATE: She underwent gangrenous transtibial amputation to an above the knee ambutation in October.  Afterwards, she reportedly had seizures.  Dilantin was switched to Keppra 750mg  twice daily.  Since that time, she reports that she occasionally gets a tired cold feeling associated with headache, but this is different than her usual seizure.  Since the amputation, she has been experiencing phantom limb neuralgia.  She says that she feels her feet and they feel cold and itchy.  She takes gabapentin 600mg  four times daily.  She has significant anxiety and depression.  She had a large right MCA stroke in November 2012 secondary to mural thrombus and cardiomyopathy.  Since that time, she has residual left-sided plegia.  She also has bilateral BTK amputations due to osteomyelitis as a complication of diabetes.  She suffers from depression and anxiety,especially since her brother passed in March 2014.  She takes Zoloft.  Since she has been living in her current facility, she has had "seizures" described as sudden shaking of her body with intact consciousness, lasting 30 seconds.  She feels tired afterwards.  She was placed on phenytoin.     She also has history of significant diabetic neuropathy, for which she has been taking gabapentin and percocet.  03/19/13 CT Head:  No acute intracranial pathology.  Mild cortical volume loss.  Encephalomalacia at right parietal  and left parieto-occipital regions.  Small vessel disease. 01/20/13 EEG:  abnormal EEG of the awake and drowsy states, with activating procedures, due to generalized slowing and disorganization of the EEG background.  This is indicative of a diffuse physiologic abnormality or encephalopathy.  This is a non-specific finding and may be secondary to toxic-metabolic, pharmacologic or organic cognitive impairment etiology. 01/14/13:  phenytoin level 24.6 03/19/13:  Na 140, K 4.9, glucose 123, Ca 8.1, Albumin 2.3, BUN 51, Cr 1.43, Total Bili 0.1, AP 99, AST 13, ALT 5 MRI Brain (04/23/11): acute large right MCA stroke, atrophy and small vessel disease. MRA Head (04/23/11):  reduced number of M3 branches in right MCA, mild atherosclerotic changes of distal MCA and PCA branches. 2D Echo (04/23/11):  LVEF 30-35%, hypokinetic LDL (06/13/10): 213 Hgb A1c (10/19/11):  8.4  PAST MEDICAL HISTORY: Past Medical History  Diagnosis Date  . HTN (hypertension)   . HLD (hyperlipidemia)   . History of methicillin resistant staphylococcus aureus (MRSA)   . CVA (cerebral infarction)   . Anemia   . Tachycardia   . Obesity   . Lower back pain     Chronic  . Idiopathic peripheral neuropathy     NOS  . DJD (degenerative joint disease)   . Headache(784.0)   . GERD (gastroesophageal reflux disease)   . Depression   . Anxiety   . Hypokalemia     resolved  . Cerebral infarct     left parietal and bilateral  . Esophagitis     distal  . Fluid overload     compensated  . Urinary retention   .  H/O: pneumonia   . History of bacteremia   . Surgery, other elective     of the ear  . Stroke     Hx of left frontoparietal stroke in 12/11. History of right brain stroke.  . Cardiomyopathy     Echocardiogram 04/23/11: Mild LVH, EF 30-35%, mild MR, mild LAE, mild-moderate RVE.  TEE 04/25/11: Moderate LVH, severe biventricular dysfunction, EF 20-25%, moderate to severe MR, moderate TR, biatrial enlargement, no PFO, negative  bubble study  . Shortness of breath     with exertion   . Sleep apnea     no machine used  pt does not remember test for sleep apnea  . DM type 2 (diabetes mellitus, type 2)     type two   oral meds only   . 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 UTI (lower urinary tract infection) 10/19/11    had approx April 2013  . Osteomyelitis   . DJD (degenerative joint disease)   . Urinary retention   . Pneumonia   . Peripheral vascular disease     MEDICATIONS: Current Outpatient Prescriptions on File Prior to Visit  Medication Sig Dispense Refill  . amLODipine (NORVASC) 5 MG tablet Take 5 mg by mouth daily.      Marland Kitchen azithromycin (ZITHROMAX) 250 MG tablet Take 250-500 mg by mouth daily. 5 day course started 03/11/13 - take 2 tablets on day 1, then take 1 tablet daily on days 2-5      . chlorhexidine (PERIDEX) 0.12 % solution Use as directed 15 mL in the mouth or throat 2 (two) times daily. Swish for 30 seconds      . cycloSPORINE (RESTASIS) 0.05 % ophthalmic emulsion Place 1 drop into both eyes 2 (two) times daily. 8am and 4pm      . feeding supplement (PRO-STAT SUGAR FREE 64) LIQD Take 30 mLs by mouth 2 (two) times daily.      . ferrous sulfate 325 (65 FE) MG tablet Take 325 mg by mouth 2 (two) times daily.       . furosemide (LASIX) 20 MG tablet Take 20 mg by mouth 2 (two) times daily.      Marland Kitchen gabapentin (NEURONTIN) 300 MG capsule Take 600 mg by mouth 4 (four) times daily.      Marland Kitchen gatifloxacin (ZYMAXID) 0.5 % SOLN Place 1 drop into the left eye 4 (four) times daily.      Marland Kitchen HYDROcodone-acetaminophen (NORCO/VICODIN) 5-325 MG per tablet Take 1 tablet by mouth every 6 (six) hours as needed for pain.      Marland Kitchen insulin aspart (NOVOLOG) 100 UNIT/ML injection Inject 5 Units into the skin 4 (four) times daily -  before meals and at bedtime. 5 units for blood sugar>150      . levETIRAcetam (KEPPRA) 750 MG tablet Take 1 tablet (750 mg total) by mouth 2 (two) times daily.      . methylphenidate  (RITALIN) 5 MG tablet Take 5 mg by mouth daily.      . metoprolol (LOPRESSOR) 100 MG tablet Take 100 mg by mouth 2 (two) times daily.      . Multiple Vitamin (MULTIVITAMIN WITH MINERALS) TABS tablet Take 2 tablets by mouth daily. Thera-M substitute for Centrum Silver      . Nutritional Supplements (RESOURCE ARGINAID) PACK Take 1 each by mouth 2 (two) times daily. Mix with 8 ounces of fluid and drink      . oxyCODONE-acetaminophen (PERCOCET/ROXICET)  5-325 MG per tablet Take 1 tablet by mouth every 4 (four) hours as needed for pain.      Vladimir Faster Glycol-Propyl Glycol (SYSTANE) 0.4-0.3 % GEL Place 1 drop into both eyes 3 (three) times daily.      . prednisoLONE acetate (PRED FORTE) 1 % ophthalmic suspension Place 1 drop into the left eye 4 (four) times daily.      . sertraline (ZOLOFT) 50 MG tablet Take 150 mg by mouth daily.      . simvastatin (ZOCOR) 20 MG tablet Take 20 mg by mouth at bedtime.      Marland Kitchen warfarin (COUMADIN) 6 MG tablet Take 6 mg by mouth at bedtime.      . [DISCONTINUED] potassium chloride SA (K-DUR,KLOR-CON) 20 MEQ tablet Take 20 mEq by mouth daily.      . [DISCONTINUED] promethazine (PHENERGAN) 25 MG/ML injection Inject 25 mg into the muscle every 6 (six) hours as needed. For nausea/vomiting       No current facility-administered medications on file prior to visit.    ALLERGIES: Allergies  Allergen Reactions  . Ace Inhibitors Shortness Of Breath  . Latex Hives  . Lisinopril Other (See Comments)    Per MAR  . Vancomycin Other (See Comments)    Per MAR    FAMILY HISTORY: Family History  Problem Relation Age of Onset  . Coronary artery disease    . Diabetes    . Cancer Mother   . Diabetes Mother   . Heart disease Father   . Hyperlipidemia Father   . Hypertension Father   . Stroke Father   . Heart disease Brother   . Anesthesia problems Neg Hx   . Hypotension Neg Hx   . Malignant hyperthermia Neg Hx   . Pseudochol deficiency Neg Hx     SOCIAL  HISTORY: History   Social History  . Marital Status: Divorced    Spouse Name: N/A    Number of Children: N/A  . Years of Education: N/A   Occupational History  . Not on file.   Social History Main Topics  . Smoking status: Never Smoker   . Smokeless tobacco: Never Used  . Alcohol Use: Yes     Comment: rare on holidays  . Drug Use: No  . Sexual Activity: Not Currently   Other Topics Concern  . Not on file   Social History Narrative   Divorced   One of 10 children   5 in [place] and was living with sister at time of CVA   Nonsmoker   Nondrinker    REVIEW OF SYSTEMS: Constitutional: No fevers, chills, or sweats, no generalized fatigue, change in appetite Eyes: No visual changes, double vision, eye pain Ear, nose and throat: No hearing loss, ear pain, nasal congestion, sore throat Cardiovascular: No chest pain, palpitations Respiratory:  No shortness of breath at rest or with exertion, wheezes GastrointestinaI: No nausea, vomiting, diarrhea, abdominal pain, fecal incontinence Genitourinary:  No dysuria, urinary retention or frequency Musculoskeletal:  No neck pain, back pain Integumentary: No rash Neurological: as above Psychiatric: Depression, insomnia, anxiety Endocrine: fatigue Hematologic/Lymphatic:  No anemia, purpura, petechiae. Allergic/Immunologic: no itchy/runny eyes, nasal congestion, recent allergic reactions, rashes  PHYSICAL EXAM: Filed Vitals:   08/14/13 1037  BP: 102/66  Pulse: 66  Resp: 16   General: No acute distress Head:  Normocephalic/atraumatic Neck: supple, no paraspinal tenderness, full range of motion Heart:  Regular rate and rhythm Lungs:  Clear to auscultation bilaterally Back: No paraspinal tenderness  Neurological Exam: Mental status:  Alert and oriented to person.  Knew she was seeing a neurologist at [facility] in [place].  Knew day of week (said the month was February and year was 2025. Speech fluent with mild dysarthria.  Language intact.   CN:  Left upper and motor facial weakness, otherwise CN II-XII intact.  Bulk & Tone:  Left upper extremity spasticity.  Motor:  LUE plegic, RUE 5/5, bilateral LE 4/5 hip flexion.  Sensation: temperature and vibration intact in upper extremities and at knees.  DTRs:  3+ LUE.  Gait:  bilateral BTK amputee.  IMPRESSION: History of large right MCA infarction Phantom Limb pain Mild cognitive impairment secondary to stroke and depression History of seizures.  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 Anxiety/Depression  PLAN: 1.  Continue Keppra 500mg  twice daily.  This may not have been my first choice, given her history of anxiety, but we will continue it since she seems stable. 2.  Continue gabapentin 600mg  four times daily. 3.  Will start Lyrica 75mg  twice daily to help with phantom limb pain.  Samples provided. 4.  Continue anticoagulation and statin for secondary stroke prevention.  Will check fasting LDL 5.  Optimize blood pressure and glycemic control 6.  Refer to behavioral medicine to address depression and anxiety. 7.  Follow up in 3 months.  Metta Clines, DO  CC:  Rogene Houston, MD

## 2013-09-24 ENCOUNTER — Ambulatory Visit (HOSPITAL_COMMUNITY): Payer: Medicare Other | Admitting: Psychiatry

## 2013-10-28 ENCOUNTER — Encounter: Payer: Self-pay | Admitting: Cardiovascular Disease

## 2013-11-13 ENCOUNTER — Ambulatory Visit: Payer: Medicare Other | Admitting: Neurology

## 2013-11-26 ENCOUNTER — Ambulatory Visit: Payer: Medicare Other | Admitting: Neurology

## 2013-12-29 ENCOUNTER — Ambulatory Visit: Payer: Medicare Other | Admitting: Neurology

## 2014-01-01 ENCOUNTER — Ambulatory Visit: Payer: Medicare Other | Admitting: Neurology

## 2014-01-08 ENCOUNTER — Encounter: Payer: Self-pay | Admitting: Neurology

## 2014-01-08 ENCOUNTER — Ambulatory Visit (INDEPENDENT_AMBULATORY_CARE_PROVIDER_SITE_OTHER): Payer: Medicare Other | Admitting: Neurology

## 2014-01-08 VITALS — BP 160/98 | HR 81

## 2014-01-08 DIAGNOSIS — F329 Major depressive disorder, single episode, unspecified: Secondary | ICD-10-CM

## 2014-01-08 DIAGNOSIS — F3289 Other specified depressive episodes: Secondary | ICD-10-CM

## 2014-01-08 DIAGNOSIS — G3184 Mild cognitive impairment, so stated: Secondary | ICD-10-CM | POA: Insufficient documentation

## 2014-01-08 DIAGNOSIS — G547 Phantom limb syndrome without pain: Secondary | ICD-10-CM

## 2014-01-08 DIAGNOSIS — F0390 Unspecified dementia without behavioral disturbance: Secondary | ICD-10-CM

## 2014-01-08 DIAGNOSIS — I679 Cerebrovascular disease, unspecified: Secondary | ICD-10-CM

## 2014-01-08 DIAGNOSIS — R569 Unspecified convulsions: Secondary | ICD-10-CM

## 2014-01-08 NOTE — Patient Instructions (Signed)
1.  Continue Keppra 500mg  twice daily 2.  Continue Lyrica 75mg  twice daily for phantom limb pain 3.  Consider increasing sertraline or switch to another agent such as lexapro to address anxiety. 4.  Follow up in 6 months.

## 2014-01-08 NOTE — Progress Notes (Signed)
NEUROLOGY FOLLOW UP OFFICE NOTE  [name] [id number]  HISTORY OF PRESENT ILLNESS: [name] is a 53 year old female with history of type II diabetes, anxiety, right MCA stroke, cardiomyopathy (EF 20-25%) and mural thrombus on AC, who follows up for cognitive deficits and seizure.  Records and images personally reviewed where available.  She has residual left-sided plegia from a cardio-embolic stroke sustained in November 2012.  She has bilateral BTK amputations, due to osteomyelitis as a complication of diabetes. She is accompanied by her sister.  UPDATE: On 12/14/13, she was hospitalized for pulmonary edema related to heart failure and acute on chronic kidney failure.   She takes Lyrica 75mg  twice daily for phantom limb pain. She has significant anxiety and depression.  When she gets anxious, she sees "bigfoot" although she knows it is not real.  She takes sertraline 50mg  daily and Ativan 0.25mg  as needed.  She takes trazodone but still has trouble sleeping. She takes Keppra 500mg  twice daily for seizure disorder.  No seizures.  HISTORY: She underwent gangrenous transtibia amputation to above the knee in October 2014 due to osteomyelitis secondary to diabetes.  She had a large right MCA stroke in November 2012 secondary to mural thrombus and cardiomyopathy.  Since that time, she has residual left-sided plegia. She is on warfarin and Zocor 20mg .     Since she has been living in her current facility, she has had "seizures" described as sudden shaking of her body with intact consciousness, lasting 30 seconds.  She feels tired afterwards.  She was placed on Dilantin, which was subsequently switched to [place].  Since that time, she has not had a seizure.    She also has bilateral BTK amputations due to osteomyelitis as a complication of diabetes.  Since the amputation, she has been experiencing phantom limb neuralgia.  She says that she feels her feet and they feel cold and itchy.  She takes  gabapentin 600mg  four times daily and Lyrica 75mg  twice daily.  She suffers from depression and anxiety, especially since her brother passed in March 2014.  She takes Zoloft.   She also has history of significant diabetic neuropathy, for which she has been taking gabapentin and percocet.  03/19/13 CT Head:  No acute intracranial pathology.  Mild cortical volume loss.  Encephalomalacia at right parietal and left parieto-occipital regions.  Small vessel disease. 01/20/13 EEG:  abnormal EEG of the awake and drowsy states, with activating procedures, due to generalized slowing and disorganization of the EEG background.  This is indicative of a diffuse physiologic abnormality or encephalopathy.  This is a non-specific finding and may be secondary to toxic-metabolic, pharmacologic or organic cognitive impairment etiology. MRI Brain (04/23/11): acute large right MCA stroke, atrophy and small vessel disease. MRA Head (04/23/11):  reduced number of M3 branches in right MCA, mild atherosclerotic changes of distal MCA and PCA branches.    PAST MEDICAL HISTORY: Past Medical History  Diagnosis Date  . HTN (hypertension)   . HLD (hyperlipidemia)   . History of methicillin resistant staphylococcus aureus (MRSA)   . CVA (cerebral infarction)   . Anemia   . Tachycardia   . Obesity   . Lower back pain     Chronic  . Idiopathic peripheral neuropathy     NOS  . DJD (degenerative joint disease)   . Headache(784.0)   . GERD (gastroesophageal reflux disease)   . Depression   . Anxiety   . Hypokalemia     resolved  .  Cerebral infarct     left parietal and bilateral  . Esophagitis     distal  . Fluid overload     compensated  . Urinary retention   . H/O: pneumonia   . History of bacteremia   . Surgery, other elective     of the ear  . Stroke     Hx of left frontoparietal stroke in 12/11. History of right brain stroke.  . Cardiomyopathy     Echocardiogram 04/23/11: Mild LVH, EF 30-35%, mild MR,  mild LAE, mild-moderate RVE.  TEE 04/25/11: Moderate LVH, severe biventricular dysfunction, EF 20-25%, moderate to severe MR, moderate TR, biatrial enlargement, no PFO, negative bubble study  . Shortness of breath     with exertion   . Sleep apnea     no machine used  pt does not remember test for sleep apnea  . DM type 2 (diabetes mellitus, type 2)     type two   oral meds only   . 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 UTI (lower urinary tract infection) 10/19/11    had approx April 2013  . Osteomyelitis   . DJD (degenerative joint disease)   . Urinary retention   . Pneumonia   . Peripheral vascular disease     MEDICATIONS: Current Outpatient Prescriptions on File Prior to Visit  Medication Sig Dispense Refill  . amLODipine (NORVASC) 5 MG tablet Take 5 mg by mouth daily.      Marland Kitchen azithromycin (ZITHROMAX) 250 MG tablet Take 250-500 mg by mouth daily. 5 day course started 03/11/13 - take 2 tablets on day 1, then take 1 tablet daily on days 2-5      . chlorhexidine (PERIDEX) 0.12 % solution Use as directed 15 mL in the mouth or throat 2 (two) times daily. Swish for 30 seconds      . cycloSPORINE (RESTASIS) 0.05 % ophthalmic emulsion Place 1 drop into both eyes 2 (two) times daily. 8am and 4pm      . feeding supplement (PRO-STAT SUGAR FREE 64) LIQD Take 30 mLs by mouth 2 (two) times daily.      . ferrous sulfate 325 (65 FE) MG tablet Take 325 mg by mouth 2 (two) times daily.       . furosemide (LASIX) 20 MG tablet Take 20 mg by mouth 2 (two) times daily.      Marland Kitchen gabapentin (NEURONTIN) 300 MG capsule Take 600 mg by mouth 4 (four) times daily.      Marland Kitchen gatifloxacin (ZYMAXID) 0.5 % SOLN Place 1 drop into the left eye 4 (four) times daily.      Marland Kitchen HYDROcodone-acetaminophen (NORCO/VICODIN) 5-325 MG per tablet Take 1 tablet by mouth every 6 (six) hours as needed for pain.      Marland Kitchen insulin aspart (NOVOLOG) 100 UNIT/ML injection Inject 5 Units into the skin 4 (four) times daily -  before meals  and at bedtime. 5 units for blood sugar>150      . levETIRAcetam (KEPPRA) 750 MG tablet Take 1 tablet (750 mg total) by mouth 2 (two) times daily.      . methylphenidate (RITALIN) 5 MG tablet Take 5 mg by mouth daily.      . metoprolol (LOPRESSOR) 100 MG tablet Take 100 mg by mouth 2 (two) times daily.      . Multiple Vitamin (MULTIVITAMIN WITH MINERALS) TABS tablet Take 2 tablets by mouth daily. Thera-M substitute for Centrum Silver      .  Nutritional Supplements (RESOURCE ARGINAID) PACK Take 1 each by mouth 2 (two) times daily. Mix with 8 ounces of fluid and drink      . oxyCODONE-acetaminophen (PERCOCET/ROXICET) 5-325 MG per tablet Take 1 tablet by mouth every 4 (four) hours as needed for pain.      Vladimir Faster Glycol-Propyl Glycol (SYSTANE) 0.4-0.3 % GEL Place 1 drop into both eyes 3 (three) times daily.      . prednisoLONE acetate (PRED FORTE) 1 % ophthalmic suspension Place 1 drop into the left eye 4 (four) times daily.      . pregabalin (LYRICA) 75 MG capsule Take 1 capsule (75 mg total) by mouth 2 (two) times daily.  60 capsule  3  . pregabalin (LYRICA) 75 MG capsule Take 1 capsule (75 mg total) by mouth 2 (two) times daily.  14 capsule  0  . sertraline (ZOLOFT) 50 MG tablet Take 150 mg by mouth daily.      . simvastatin (ZOCOR) 20 MG tablet Take 20 mg by mouth at bedtime.      Marland Kitchen warfarin (COUMADIN) 6 MG tablet Take 6 mg by mouth at bedtime.      . [DISCONTINUED] potassium chloride SA (K-DUR,KLOR-CON) 20 MEQ tablet Take 20 mEq by mouth daily.      . [DISCONTINUED] promethazine (PHENERGAN) 25 MG/ML injection Inject 25 mg into the muscle every 6 (six) hours as needed. For nausea/vomiting       No current facility-administered medications on file prior to visit.    ALLERGIES: Allergies  Allergen Reactions  . Ace Inhibitors Shortness Of Breath  . Latex Hives  . Lisinopril Other (See Comments)    Per MAR  . Vancomycin Other (See Comments)    Per MAR    FAMILY HISTORY: Family  History  Problem Relation Age of Onset  . Coronary artery disease    . Diabetes    . Cancer Mother   . Diabetes Mother   . Heart disease Father   . Hyperlipidemia Father   . Hypertension Father   . Stroke Father   . Heart disease Brother   . Anesthesia problems Neg Hx   . Hypotension Neg Hx   . Malignant hyperthermia Neg Hx   . Pseudochol deficiency Neg Hx     SOCIAL HISTORY: History   Social History  . Marital Status: Divorced    Spouse Name: N/A    Number of Children: N/A  . Years of Education: N/A   Occupational History  . Not on file.   Social History Main Topics  . Smoking status: Never Smoker   . Smokeless tobacco: Never Used  . Alcohol Use: Yes     Comment: rare on holidays  . Drug Use: No  . Sexual Activity: Not Currently   Other Topics Concern  . Not on file   Social History Narrative   Divorced   One of 10 children   5 in [place] and was living with sister at time of CVA   Nonsmoker   Nondrinker    REVIEW OF SYSTEMS: Constitutional: No fevers, chills, or sweats, no generalized fatigue, change in appetite Eyes: No visual changes, double vision, eye pain Ear, nose and throat: No hearing loss, ear pain, nasal congestion, sore throat Cardiovascular: No chest pain, palpitations Respiratory:  No shortness of breath at rest or with exertion, wheezes GastrointestinaI: No nausea, vomiting, diarrhea, abdominal pain, fecal incontinence Genitourinary:  No dysuria, urinary retention or frequency Musculoskeletal:  No neck pain, back pain Integumentary: No  rash, pruritus, skin lesions Neurological: as above Psychiatric: depression, insomnia, anxiety Endocrine: No palpitations, fatigue, diaphoresis, mood swings, change in appetite, change in weight, increased thirst Hematologic/Lymphatic:  No anemia, purpura, petechiae. Allergic/Immunologic: no itchy/runny eyes, nasal congestion, recent allergic reactions, rashes  PHYSICAL EXAM: Filed Vitals:   01/08/14 1245    BP: 160/98  Pulse: 81   General: No acute distress Head:  Normocephalic/atraumatic Neck: supple, no paraspinal tenderness, full range of motion Heart:  Regular rate and rhythm Lungs:  Clear to auscultation bilaterally Back: No paraspinal tenderness Neurological Exam: Alert and oriented to person.  Knew she was seeing a neurologist at [facility] in [place].  Knew place, including street, city, state, and county.  She knew month, day, year but not date. Speech fluent with mild dysarthria.  Language intact.  CN:  Left upper and motor facial weakness, otherwise CN II-XII intact.  Bulk & Tone:  Left upper extremity spasticity.  Motor:  LUE plegic, RUE 5/5, bilateral LE 4/5 hip flexion.  Sensation: temperature and vibration intact in upper extremities and at knees.  DTRs:  3+ LUE.  Gait:  bilateral BTK amputee.  IMPRESSION: History of large right MCA infarction Phantom Limb pain Mild cognitive impairment secondary to stroke and depression History of seizures.  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 Anxiety/Depression  PLAN: Keppra 500mg  twice daily for seizure prophylaxis Lyrica 75mg  twice daily for phantom limb pain Anticoagulation and statin for secondary stroke prevention.  LDL goal should be less than 70. Blood pressure and glycemic control Consider adjustment of antidepressant to address anxiety.  Follow up in 6 months  Metta Clines, DO  CC:  Rogene Houston, MD

## 2014-03-11 ENCOUNTER — Inpatient Hospital Stay (HOSPITAL_COMMUNITY)
Admission: EM | Admit: 2014-03-11 | Discharge: 2014-03-19 | DRG: 252 | Disposition: A | Discharge status: Skilled Nursing Facility | Payer: Medicare Other | Attending: Internal Medicine | Admitting: Internal Medicine

## 2014-03-11 ENCOUNTER — Encounter (HOSPITAL_COMMUNITY): Payer: Self-pay | Admitting: Emergency Medicine

## 2014-03-11 ENCOUNTER — Emergency Department (HOSPITAL_COMMUNITY): Payer: Medicare Other

## 2014-03-11 DIAGNOSIS — Z992 Dependence on renal dialysis: Secondary | ICD-10-CM | POA: Diagnosis not present

## 2014-03-11 DIAGNOSIS — I272 Other secondary pulmonary hypertension: Secondary | ICD-10-CM | POA: Diagnosis present

## 2014-03-11 DIAGNOSIS — I129 Hypertensive chronic kidney disease with stage 1 through stage 4 chronic kidney disease, or unspecified chronic kidney disease: Secondary | ICD-10-CM | POA: Diagnosis present

## 2014-03-11 DIAGNOSIS — K30 Functional dyspepsia: Secondary | ICD-10-CM | POA: Diagnosis not present

## 2014-03-11 DIAGNOSIS — A047 Enterocolitis due to Clostridium difficile: Secondary | ICD-10-CM | POA: Diagnosis present

## 2014-03-11 DIAGNOSIS — N183 Chronic kidney disease, stage 3 (moderate): Secondary | ICD-10-CM | POA: Diagnosis present

## 2014-03-11 DIAGNOSIS — T501X5A Adverse effect of loop [high-ceiling] diuretics, initial encounter: Secondary | ICD-10-CM | POA: Diagnosis not present

## 2014-03-11 DIAGNOSIS — N19 Unspecified kidney failure: Secondary | ICD-10-CM | POA: Diagnosis present

## 2014-03-11 DIAGNOSIS — Z8673 Personal history of transient ischemic attack (TIA), and cerebral infarction without residual deficits: Secondary | ICD-10-CM | POA: Diagnosis not present

## 2014-03-11 DIAGNOSIS — A0472 Enterocolitis due to Clostridium difficile, not specified as recurrent: Secondary | ICD-10-CM

## 2014-03-11 DIAGNOSIS — Z7901 Long term (current) use of anticoagulants: Secondary | ICD-10-CM | POA: Diagnosis not present

## 2014-03-11 DIAGNOSIS — E118 Type 2 diabetes mellitus with unspecified complications: Secondary | ICD-10-CM | POA: Diagnosis present

## 2014-03-11 DIAGNOSIS — I429 Cardiomyopathy, unspecified: Secondary | ICD-10-CM | POA: Diagnosis present

## 2014-03-11 DIAGNOSIS — I5023 Acute on chronic systolic (congestive) heart failure: Principal | ICD-10-CM | POA: Diagnosis present

## 2014-03-11 DIAGNOSIS — G92 Toxic encephalopathy: Secondary | ICD-10-CM | POA: Diagnosis present

## 2014-03-11 DIAGNOSIS — I4891 Unspecified atrial fibrillation: Secondary | ICD-10-CM | POA: Diagnosis present

## 2014-03-11 DIAGNOSIS — K219 Gastro-esophageal reflux disease without esophagitis: Secondary | ICD-10-CM | POA: Diagnosis present

## 2014-03-11 DIAGNOSIS — E785 Hyperlipidemia, unspecified: Secondary | ICD-10-CM | POA: Diagnosis present

## 2014-03-11 DIAGNOSIS — F411 Generalized anxiety disorder: Secondary | ICD-10-CM | POA: Diagnosis present

## 2014-03-11 DIAGNOSIS — E1140 Type 2 diabetes mellitus with diabetic neuropathy, unspecified: Secondary | ICD-10-CM | POA: Diagnosis present

## 2014-03-11 DIAGNOSIS — Z86718 Personal history of other venous thrombosis and embolism: Secondary | ICD-10-CM

## 2014-03-11 DIAGNOSIS — Z89611 Acquired absence of right leg above knee: Secondary | ICD-10-CM | POA: Diagnosis not present

## 2014-03-11 DIAGNOSIS — Z87898 Personal history of other specified conditions: Secondary | ICD-10-CM

## 2014-03-11 DIAGNOSIS — N184 Chronic kidney disease, stage 4 (severe): Secondary | ICD-10-CM

## 2014-03-11 DIAGNOSIS — N186 End stage renal disease: Secondary | ICD-10-CM

## 2014-03-11 DIAGNOSIS — E876 Hypokalemia: Secondary | ICD-10-CM | POA: Diagnosis not present

## 2014-03-11 DIAGNOSIS — I5020 Unspecified systolic (congestive) heart failure: Secondary | ICD-10-CM

## 2014-03-11 DIAGNOSIS — E871 Hypo-osmolality and hyponatremia: Secondary | ICD-10-CM | POA: Diagnosis not present

## 2014-03-11 DIAGNOSIS — R569 Unspecified convulsions: Secondary | ICD-10-CM | POA: Diagnosis present

## 2014-03-11 DIAGNOSIS — D509 Iron deficiency anemia, unspecified: Secondary | ICD-10-CM | POA: Diagnosis present

## 2014-03-11 DIAGNOSIS — Z794 Long term (current) use of insulin: Secondary | ICD-10-CM

## 2014-03-11 DIAGNOSIS — G547 Phantom limb syndrome without pain: Secondary | ICD-10-CM | POA: Diagnosis present

## 2014-03-11 DIAGNOSIS — Z8249 Family history of ischemic heart disease and other diseases of the circulatory system: Secondary | ICD-10-CM

## 2014-03-11 DIAGNOSIS — Z89512 Acquired absence of left leg below knee: Secondary | ICD-10-CM

## 2014-03-11 DIAGNOSIS — Z7401 Bed confinement status: Secondary | ICD-10-CM | POA: Diagnosis not present

## 2014-03-11 DIAGNOSIS — R0789 Other chest pain: Secondary | ICD-10-CM

## 2014-03-11 DIAGNOSIS — Z7189 Other specified counseling: Secondary | ICD-10-CM

## 2014-03-11 DIAGNOSIS — Z8701 Personal history of pneumonia (recurrent): Secondary | ICD-10-CM

## 2014-03-11 DIAGNOSIS — G4730 Sleep apnea, unspecified: Secondary | ICD-10-CM | POA: Diagnosis present

## 2014-03-11 DIAGNOSIS — Z89422 Acquired absence of other left toe(s): Secondary | ICD-10-CM

## 2014-03-11 DIAGNOSIS — R072 Precordial pain: Secondary | ICD-10-CM | POA: Diagnosis present

## 2014-03-11 DIAGNOSIS — G8929 Other chronic pain: Secondary | ICD-10-CM | POA: Diagnosis present

## 2014-03-11 DIAGNOSIS — D649 Anemia, unspecified: Secondary | ICD-10-CM | POA: Diagnosis present

## 2014-03-11 DIAGNOSIS — N179 Acute kidney failure, unspecified: Secondary | ICD-10-CM | POA: Diagnosis present

## 2014-03-11 DIAGNOSIS — R079 Chest pain, unspecified: Secondary | ICD-10-CM | POA: Diagnosis present

## 2014-03-11 DIAGNOSIS — Z833 Family history of diabetes mellitus: Secondary | ICD-10-CM

## 2014-03-11 DIAGNOSIS — Z72820 Sleep deprivation: Secondary | ICD-10-CM | POA: Diagnosis not present

## 2014-03-11 DIAGNOSIS — Z8614 Personal history of Methicillin resistant Staphylococcus aureus infection: Secondary | ICD-10-CM | POA: Diagnosis not present

## 2014-03-11 DIAGNOSIS — Z6841 Body Mass Index (BMI) 40.0 and over, adult: Secondary | ICD-10-CM | POA: Diagnosis not present

## 2014-03-11 DIAGNOSIS — G546 Phantom limb syndrome with pain: Secondary | ICD-10-CM | POA: Diagnosis present

## 2014-03-11 DIAGNOSIS — E1142 Type 2 diabetes mellitus with diabetic polyneuropathy: Secondary | ICD-10-CM | POA: Diagnosis present

## 2014-03-11 DIAGNOSIS — R531 Weakness: Secondary | ICD-10-CM

## 2014-03-11 DIAGNOSIS — Z515 Encounter for palliative care: Secondary | ICD-10-CM

## 2014-03-11 DIAGNOSIS — I10 Essential (primary) hypertension: Secondary | ICD-10-CM | POA: Diagnosis present

## 2014-03-11 DIAGNOSIS — I739 Peripheral vascular disease, unspecified: Secondary | ICD-10-CM | POA: Diagnosis present

## 2014-03-11 LAB — URINALYSIS, ROUTINE W REFLEX MICROSCOPIC
Bilirubin Urine: NEGATIVE
Glucose, UA: NEGATIVE mg/dL
KETONES UR: 15 mg/dL — AB
NITRITE: NEGATIVE
Protein, ur: 100 mg/dL — AB
Specific Gravity, Urine: 1.008 (ref 1.005–1.030)
Urobilinogen, UA: 0.2 mg/dL (ref 0.0–1.0)
pH: 6 (ref 5.0–8.0)

## 2014-03-11 LAB — I-STAT CHEM 8, ED
BUN: 13 mg/dL (ref 6–23)
Calcium, Ion: 1.25 mmol/L — ABNORMAL HIGH (ref 1.12–1.23)
Chloride: 103 mEq/L (ref 96–112)
Creatinine, Ser: 1.4 mg/dL — ABNORMAL HIGH (ref 0.50–1.10)
GLUCOSE: 91 mg/dL (ref 70–99)
HCT: 37 % (ref 36.0–46.0)
Hemoglobin: 12.6 g/dL (ref 12.0–15.0)
Potassium: 3.2 mEq/L — ABNORMAL LOW (ref 3.7–5.3)
Sodium: 137 mEq/L (ref 137–147)
TCO2: 26 mmol/L (ref 0–100)

## 2014-03-11 LAB — COMPREHENSIVE METABOLIC PANEL
ALT: 6 U/L (ref 0–35)
AST: 19 U/L (ref 0–37)
Albumin: 2.9 g/dL — ABNORMAL LOW (ref 3.5–5.2)
Alkaline Phosphatase: 107 U/L (ref 39–117)
Anion gap: 11 (ref 5–15)
BILIRUBIN TOTAL: 1.1 mg/dL (ref 0.3–1.2)
BUN: 14 mg/dL (ref 6–23)
CHLORIDE: 102 meq/L (ref 96–112)
CO2: 27 meq/L (ref 19–32)
Calcium: 9.7 mg/dL (ref 8.4–10.5)
Creatinine, Ser: 1.34 mg/dL — ABNORMAL HIGH (ref 0.50–1.10)
GFR calc Af Amer: 52 mL/min — ABNORMAL LOW (ref >90)
GFR, EST NON AFRICAN AMERICAN: 45 mL/min — AB (ref >90)
Glucose, Bld: 88 mg/dL (ref 70–99)
Potassium: 3.5 mEq/L — ABNORMAL LOW (ref 3.7–5.3)
SODIUM: 140 meq/L (ref 137–147)
Total Protein: 7.6 g/dL (ref 6.0–8.3)

## 2014-03-11 LAB — CBC WITH DIFFERENTIAL/PLATELET
BASOS ABS: 0 10*3/uL (ref 0.0–0.1)
Basophils Relative: 0 % (ref 0–1)
Eosinophils Absolute: 0.2 10*3/uL (ref 0.0–0.7)
Eosinophils Relative: 5 % (ref 0–5)
HEMATOCRIT: 32.3 % — AB (ref 36.0–46.0)
Hemoglobin: 10.1 g/dL — ABNORMAL LOW (ref 12.0–15.0)
LYMPHS PCT: 19 % (ref 12–46)
Lymphs Abs: 1 10*3/uL (ref 0.7–4.0)
MCH: 28.5 pg (ref 26.0–34.0)
MCHC: 31.3 g/dL (ref 30.0–36.0)
MCV: 91 fL (ref 78.0–100.0)
Monocytes Absolute: 0.7 10*3/uL (ref 0.1–1.0)
Monocytes Relative: 12 % (ref 3–12)
Neutro Abs: 3.4 10*3/uL (ref 1.7–7.7)
Neutrophils Relative %: 64 % (ref 43–77)
PLATELETS: 98 10*3/uL — AB (ref 150–400)
RBC: 3.55 MIL/uL — ABNORMAL LOW (ref 3.87–5.11)
RDW: 19 % — AB (ref 11.5–15.5)
WBC: 5.4 10*3/uL (ref 4.0–10.5)

## 2014-03-11 LAB — URINE MICROSCOPIC-ADD ON

## 2014-03-11 LAB — I-STAT TROPONIN, ED: Troponin i, poc: 0.01 ng/mL (ref 0.00–0.08)

## 2014-03-11 LAB — PROTIME-INR
INR: 1.3 (ref 0.00–1.49)
Prothrombin Time: 16.2 seconds — ABNORMAL HIGH (ref 11.6–15.2)

## 2014-03-11 LAB — TROPONIN I: Troponin I: <0.3 ng/mL (ref <0.30)

## 2014-03-11 LAB — GLUCOSE, CAPILLARY: Glucose-Capillary: 81 mg/dL (ref 70–99)

## 2014-03-11 LAB — PRO B NATRIURETIC PEPTIDE: PRO B NATRI PEPTIDE: 52920 pg/mL — AB (ref 0–125)

## 2014-03-11 MED ORDER — HYDRALAZINE HCL 10 MG PO TABS
10.0000 mg | ORAL_TABLET | Freq: Three times a day (TID) | ORAL | Status: DC
  Administered 2014-03-11 – 2014-03-19 (×20): 10 mg via ORAL
  Filled 2014-03-11 (×25): qty 1

## 2014-03-11 MED ORDER — FERROUS SULFATE 325 (65 FE) MG PO TABS
325.0000 mg | ORAL_TABLET | Freq: Two times a day (BID) | ORAL | Status: DC
  Administered 2014-03-11 – 2014-03-18 (×12): 325 mg via ORAL
  Filled 2014-03-11 (×15): qty 1

## 2014-03-11 MED ORDER — PANTOPRAZOLE SODIUM 40 MG PO TBEC
40.0000 mg | DELAYED_RELEASE_TABLET | Freq: Every day | ORAL | Status: DC
  Administered 2014-03-12 – 2014-03-15 (×4): 40 mg via ORAL
  Filled 2014-03-11 (×4): qty 1

## 2014-03-11 MED ORDER — MICONAZOLE NITRATE 2 % EX CREA
TOPICAL_CREAM | Freq: Two times a day (BID) | CUTANEOUS | Status: DC
  Administered 2014-03-11: 1 via TOPICAL
  Administered 2014-03-12 – 2014-03-13 (×3): via TOPICAL
  Administered 2014-03-13: 1 via TOPICAL
  Administered 2014-03-14 – 2014-03-18 (×8): via TOPICAL
  Administered 2014-03-18: 1 via TOPICAL
  Administered 2014-03-19: 11:00:00 via TOPICAL
  Filled 2014-03-11 (×2): qty 14

## 2014-03-11 MED ORDER — SERTRALINE HCL 50 MG PO TABS
150.0000 mg | ORAL_TABLET | Freq: Every day | ORAL | Status: DC
  Administered 2014-03-11 – 2014-03-16 (×5): 150 mg via ORAL
  Filled 2014-03-11 (×8): qty 1

## 2014-03-11 MED ORDER — ISOSORBIDE DINITRATE 20 MG PO TABS
20.0000 mg | ORAL_TABLET | Freq: Three times a day (TID) | ORAL | Status: DC
Start: 2014-03-11 — End: 2014-03-19
  Administered 2014-03-11 – 2014-03-19 (×20): 20 mg via ORAL
  Filled 2014-03-11 (×25): qty 1

## 2014-03-11 MED ORDER — SPIRONOLACTONE 12.5 MG HALF TABLET
12.5000 mg | ORAL_TABLET | Freq: Every day | ORAL | Status: DC
  Administered 2014-03-11 – 2014-03-14 (×4): 12.5 mg via ORAL
  Filled 2014-03-11 (×5): qty 1

## 2014-03-11 MED ORDER — INSULIN ASPART 100 UNIT/ML ~~LOC~~ SOLN
0.0000 [IU] | Freq: Three times a day (TID) | SUBCUTANEOUS | Status: DC
  Administered 2014-03-14 – 2014-03-18 (×5): 1 [IU] via SUBCUTANEOUS

## 2014-03-11 MED ORDER — PREGABALIN 75 MG PO CAPS
75.0000 mg | ORAL_CAPSULE | Freq: Two times a day (BID) | ORAL | Status: DC
  Administered 2014-03-11 – 2014-03-16 (×11): 75 mg via ORAL
  Filled 2014-03-11 (×13): qty 1

## 2014-03-11 MED ORDER — INSULIN ASPART 100 UNIT/ML ~~LOC~~ SOLN: 0.0000 [IU] | Freq: Every day | SUBCUTANEOUS | Status: DC

## 2014-03-11 MED ORDER — ADULT MULTIVITAMIN W/MINERALS CH
1.0000 | ORAL_TABLET | Freq: Every day | ORAL | Status: DC
  Administered 2014-03-12 – 2014-03-16 (×5): 1 via ORAL
  Filled 2014-03-11 (×7): qty 1

## 2014-03-11 MED ORDER — CARVEDILOL 3.125 MG PO TABS
3.1250 mg | ORAL_TABLET | Freq: Two times a day (BID) | ORAL | Status: DC
  Administered 2014-03-11 – 2014-03-19 (×14): 3.125 mg via ORAL
  Filled 2014-03-11 (×18): qty 1

## 2014-03-11 MED ORDER — SODIUM CHLORIDE 0.9 % IJ SOLN
3.0000 mL | Freq: Two times a day (BID) | INTRAMUSCULAR | Status: DC
  Administered 2014-03-11 – 2014-03-19 (×11): 3 mL via INTRAVENOUS

## 2014-03-11 MED ORDER — ATORVASTATIN CALCIUM 20 MG PO TABS
20.0000 mg | ORAL_TABLET | Freq: Every day | ORAL | Status: DC
  Administered 2014-03-11 – 2014-03-18 (×7): 20 mg via ORAL
  Filled 2014-03-11 (×10): qty 1

## 2014-03-11 MED ORDER — LEVETIRACETAM 500 MG PO TABS
500.0000 mg | ORAL_TABLET | Freq: Two times a day (BID) | ORAL | Status: DC
  Administered 2014-03-11 – 2014-03-19 (×14): 500 mg via ORAL
  Filled 2014-03-11 (×17): qty 1

## 2014-03-11 MED ORDER — METHYLPHENIDATE HCL 5 MG PO TABS
5.0000 mg | ORAL_TABLET | Freq: Every day | ORAL | Status: DC
  Administered 2014-03-12 – 2014-03-16 (×4): 5 mg via ORAL
  Filled 2014-03-11 (×6): qty 1

## 2014-03-11 MED ORDER — TRAMADOL HCL 50 MG PO TABS
50.0000 mg | ORAL_TABLET | Freq: Three times a day (TID) | ORAL | Status: DC | PRN
  Administered 2014-03-12 – 2014-03-18 (×7): 50 mg via ORAL
  Filled 2014-03-11 (×9): qty 1

## 2014-03-11 MED ORDER — ALLOPURINOL 100 MG PO TABS
100.0000 mg | ORAL_TABLET | Freq: Every day | ORAL | Status: DC
  Administered 2014-03-11 – 2014-03-19 (×8): 100 mg via ORAL
  Filled 2014-03-11 (×9): qty 1

## 2014-03-11 MED ORDER — FUROSEMIDE 10 MG/ML IJ SOLN
40.0000 mg | Freq: Once | INTRAMUSCULAR | Status: AC
  Administered 2014-03-11: 40 mg via INTRAVENOUS
  Filled 2014-03-11: qty 4

## 2014-03-11 MED ORDER — TORSEMIDE 20 MG PO TABS
20.0000 mg | ORAL_TABLET | Freq: Every day | ORAL | Status: DC
  Administered 2014-03-12: 20 mg via ORAL
  Filled 2014-03-11: qty 1

## 2014-03-11 MED ORDER — LORAZEPAM 0.5 MG PO TABS
0.5000 mg | ORAL_TABLET | ORAL | Status: DC | PRN
  Administered 2014-03-12 – 2014-03-16 (×9): 0.5 mg via ORAL
  Filled 2014-03-11 (×9): qty 1

## 2014-03-11 MED ORDER — POLYETHYLENE GLYCOL 3350 17 G PO PACK
17.0000 g | PACK | Freq: Every day | ORAL | Status: DC
  Administered 2014-03-11 – 2014-03-13 (×2): 17 g via ORAL
  Filled 2014-03-11 (×8): qty 1

## 2014-03-11 MED ORDER — WARFARIN SODIUM 6 MG PO TABS: 6.0000 mg | ORAL_TABLET | Freq: Every day | ORAL | Status: DC

## 2014-03-11 NOTE — H&P (Signed)
Date: 03/11/2014               Patient Name:  [name] MRN: [id number]  DOB: [birth year] Age / Sex: 53 y.o., female   PCP: Rogene Houston, MD              Medical Service: Internal Medicine Teaching Service              Attending Physician: Dr. Megan Salon    First Contact: Delynn Flavin, [place] 3 Pager: [phone]  Second Contact: Dr. Abagail Kitchens  Pager: [phone]  Third Contact Dr. Duwaine Maxin Pager: [phone]       After Hours (After 5p/  First Contact Pager: [phone]  weekends / holidays): Second Contact Pager: [phone]   Chief Complaint: Headache   History of Present Illness: Ms. Capasso is a 53 year old female with a pertinent past medical history of CHF(EF of 20%), ESRD on dialysis, bilateral MCA stroke, left frontoparietal stroke,  type 2 diabetes mellitus, HTN who presented to the ED by EMS after experiencing stabbing and burning chest pain during dialysis earlier this morning. This afternoon the patient was somnolent and not conversational as a result she was not able to give a clear picture as to what lead her to come into the hospital.  She endorsed having a headache localized to the back of her head and feeling feverish.  Per dialysis center, patient had dialysis at 6:30AM this morning and was conversational.  They reported that she normally receives 3 hrs of treatment.  With 20 minutes left of treatment the patient began to endorse stabbing/buring chest pain which she rated as a 4 out of 10.  Due to her cardiac history dialysis was stopped and EMS was called.  At the time her BP was 130/54 HR 84, Temp 98.2.  At this time patient denies chest pain and SOB.   Patient was also recently hospitalized at [facility] 2 weeks ago for fluid overload and UTI.  They also started her on dialysis on this admission.  She has also been hospitalized 2 other times since June for complications of CHF.   Meds: No current facility-administered medications for this encounter.   Current Outpatient  Prescriptions  Medication Sig Dispense Refill  . allopurinol (ZYLOPRIM) 100 MG tablet Take 100 mg by mouth daily.      Marland Kitchen atorvastatin (LIPITOR) 20 MG tablet Take 20 mg by mouth at bedtime.      . Calcium Carbonate-Vitamin D (CALCIUM + D PO) Take 1 tablet by mouth 2 (two) times daily.      . carvedilol (COREG) 3.125 MG tablet Take 3.125 mg by mouth 2 (two) times daily with a meal.      . ferrous sulfate 325 (65 FE) MG tablet Take 325 mg by mouth 2 (two) times daily.       . hydrALAZINE (APRESOLINE) 10 MG tablet Take 10 mg by mouth 3 (three) times daily.      . isosorbide dinitrate (ISORDIL) 20 MG tablet Take 20 mg by mouth 3 (three) times daily.      . isosorbide mononitrate (ISMO,MONOKET) 20 MG tablet Take by mouth 2 (two) times daily at 10 AM and 5 PM.      . levETIRAcetam (KEPPRA) 500 MG tablet Take 500 mg by mouth 2 (two) times daily.      Marland Kitchen LORazepam (ATIVAN) 0.5 MG tablet Take 0.5 mg by mouth every 4 (four) hours as needed for anxiety.      Marland Kitchen MICONAZOLE  NITRATE EX Apply 1 application topically 2 (two) times daily.      . Multiple Vitamin (MULTIVITAMIN WITH MINERALS) TABS tablet Take 1 tablet by mouth daily.       . polyethylene glycol (MIRALAX / GLYCOLAX) packet Take 17 g by mouth daily.      . pregabalin (LYRICA) 75 MG capsule Take 1 capsule (75 mg total) by mouth 2 (two) times daily.  60 capsule  3  . sertraline (ZOLOFT) 50 MG tablet Take 150 mg by mouth daily.      Marland Kitchen spironolactone (ALDACTONE) 12.5 mg TABS tablet Take 12.5 mg by mouth daily.      Marland Kitchen tiZANidine (ZANAFLEX) 4 MG tablet Take 4 mg by mouth every 8 (eight) hours as needed for muscle spasms.      Marland Kitchen torsemide (DEMADEX) 20 MG tablet Take 20 mg by mouth daily.      . traMADol (ULTRAM) 50 MG tablet Take 50 mg by mouth every 8 (eight) hours as needed (for pain).      . traZODone (DESYREL) 50 MG tablet Take 50 mg by mouth at bedtime.      . insulin aspart (NOVOLOG) 100 UNIT/ML injection Inject 5 Units into the skin 4 (four) times  daily -  before meals and at bedtime. 5 units for blood sugar>150      . methylphenidate (RITALIN) 5 MG tablet Take 5 mg by mouth daily.      . metoprolol (LOPRESSOR) 100 MG tablet Take 100 mg by mouth 2 (two) times daily.      Marland Kitchen warfarin (COUMADIN) 6 MG tablet Take 6 mg by mouth at bedtime.      . [DISCONTINUED] potassium chloride SA (K-DUR,KLOR-CON) 20 MEQ tablet Take 20 mEq by mouth daily.      . [DISCONTINUED] promethazine (PHENERGAN) 25 MG/ML injection Inject 25 mg into the muscle every 6 (six) hours as needed. For nausea/vomiting        Allergies: Allergies as of 03/11/2014 - Review Complete 03/11/2014  Allergen Reaction Noted  . Ace inhibitors Shortness Of Breath 04/26/2011  . Latex Hives 10/19/2011  . Lisinopril Other (See Comments) 09/13/2011  . Vancomycin Other (See Comments) 10/10/2010   Past Medical History  Diagnosis Date  . HTN (hypertension)   . HLD (hyperlipidemia)   . History of methicillin resistant staphylococcus aureus (MRSA)   . CVA (cerebral infarction)   . Anemia   . Tachycardia   . Obesity   . Lower back pain     Chronic  . Idiopathic peripheral neuropathy     NOS  . DJD (degenerative joint disease)   . Headache(784.0)   . GERD (gastroesophageal reflux disease)   . Depression   . Anxiety   . Hypokalemia     resolved  . Cerebral infarct     left parietal and bilateral  . Esophagitis     distal  . Fluid overload     compensated  . Urinary retention   . H/O: pneumonia   . History of bacteremia   . Surgery, other elective     of the ear  . Stroke     Hx of left frontoparietal stroke in 12/11. History of right brain stroke.  . Cardiomyopathy     Echocardiogram 04/23/11: Mild LVH, EF 30-35%, mild MR, mild LAE, mild-moderate RVE.  TEE 04/25/11: Moderate LVH, severe biventricular dysfunction, EF 20-25%, moderate to severe MR, moderate TR, biatrial enlargement, no PFO, negative bubble study  . Shortness of breath  with exertion   . Sleep apnea       no machine used  pt does not remember test for sleep apnea  . DM type 2 (diabetes mellitus, type 2)     type two   oral meds only   . 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 UTI (lower urinary tract infection) 10/19/11    had approx April 2013  . Osteomyelitis   . DJD (degenerative joint disease)   . Urinary retention   . Pneumonia   . Peripheral vascular disease    Past Surgical History  Procedure Laterality Date  . Tympanoplasty    . Toe amputation      left 2nd toe  . Dilation and curettage of uterus      history  . Tee without cardioversion  04/25/2011    Procedure: TRANSESOPHAGEAL ECHOCARDIOGRAM (TEE);  Surgeon: Jolaine Artist, MD;  Location: [facility] ENDOSCOPY;  Service: Cardiovascular;  Laterality: N/A;  . Esophagogastroduodenoscopy  08/14/2011    Procedure: ESOPHAGOGASTRODUODENOSCOPY (EGD);  Surgeon: Scarlette Shorts, MD;  Location: [facility] ENDOSCOPY;  Service: Endoscopy;  Laterality: N/A;  . Amputation  01/18/2012    Procedure: AMPUTATION BELOW KNEE;  Surgeon: Newt Minion, MD;  Location: [place];  Service: Orthopedics;  Laterality: Left;  Left Below Knee Amputation  . Amputation Right 08/01/2012    Procedure: Right Below Knee Amputation;  Surgeon: Newt Minion, MD;  Location: [place];  Service: Orthopedics;  Laterality: Right;  Right Below Knee Amputation  . Pars plana vitrectomy Left 01/28/2013    Procedure: PARS PLANA VITRECTOMY WITH 23 GAUGE;  Surgeon: Adonis Brook, MD;  Location: [place];  Service: Ophthalmology;  Laterality: Left;  . Photocoagulation with laser Left 01/28/2013    Procedure: PHOTOCOAGULATION WITH LASER;  Surgeon: Adonis Brook, MD;  Location: [place];  Service: Ophthalmology;  Laterality: Left;  ENDOLASER  . Membrane peel Left 01/28/2013    Procedure: MEMBRANE PEEL;  Surgeon: Adonis Brook, MD;  Location: [place];  Service: Ophthalmology;  Laterality: Left;  . Amputation Left 03/18/2013    Procedure: AMPUTATION ABOVE KNEE;  Surgeon: Newt Minion, MD;  Location: [place];   Service: Orthopedics;  Laterality: Left;   Family History  Problem Relation Age of Onset  . Coronary artery disease    . Diabetes    . Cancer Mother   . Diabetes Mother   . Heart disease Father   . Hyperlipidemia Father   . Hypertension Father   . Stroke Father   . Heart disease Brother   . Anesthesia problems Neg Hx   . Hypotension Neg Hx   . Malignant hyperthermia Neg Hx   . Pseudochol deficiency Neg Hx    History   Social History  . Marital Status: Divorced    Spouse Name: N/A    Number of Children: N/A  . Years of Education: N/A   Occupational History  . Not on file.   Social History Main Topics  . Smoking status: Never Smoker   . Smokeless tobacco: Never Used  . Alcohol Use: Yes     Comment: rare on holidays  . Drug Use: No  . Sexual Activity: Not Currently   Other Topics Concern  . Not on file   Social History Narrative   Divorced   One of 10 children   5 in [place] and was living with sister at time of CVA   Nonsmoker   Nondrinker    Review  of Systems: Pertinent items are noted in HPI.  Physical Exam: Blood pressure 155/120, pulse 82, temperature 98.6 F (37 C), temperature source Oral, resp. rate 20, height 5' (1.524 m), weight 111.131 kg (245 lb), SpO2 97.00%. General appearance: morbidly obese, slowed mentation and somnolent  HEENT: EOMI, PERRLA, moist mucus membranes, actively drooling (excessive salivation/secretions)  Lungs: rhonchi base - right and left, exam limited by body habitus  Heart: regular rate and rhythm Abdomen: soft, non-tender, hypoactive BS on left Extremities: BTK on right, AKA on left  Neuro: gross weakness on left likely 2/2 to stroke, grip strength on right 4/5 1/5 on left   Lab results: Recent Results (from the past 2160 hour(s))  CBC WITH DIFFERENTIAL     Status: Abnormal   Collection Time    03/11/14 11:45 AM      Result Value Ref Range   WBC 5.4  4.0 - 10.5 K/uL   RBC 3.55 (*) 3.87 - 5.11 MIL/uL   Hemoglobin 10.1 (*)  12.0 - 15.0 g/dL   Comment: REPEATED TO VERIFY   HCT 32.3 (*) 36.0 - 46.0 %   MCV 91.0  78.0 - 100.0 fL   MCH 28.5  26.0 - 34.0 pg   MCHC 31.3  30.0 - 36.0 g/dL   RDW 19.0 (*) 11.5 - 15.5 %   Platelets 98 (*) 150 - 400 K/uL   Comment: SPECIMEN CHECKED FOR CLOTS     REPEATED TO VERIFY     PLATELET COUNT CONFIRMED BY SMEAR   Neutrophils Relative % 64  43 - 77 %   Neutro Abs 3.4  1.7 - 7.7 K/uL   Lymphocytes Relative 19  12 - 46 %   Lymphs Abs 1.0  0.7 - 4.0 K/uL   Monocytes Relative 12  3 - 12 %   Monocytes Absolute 0.7  0.1 - 1.0 K/uL   Eosinophils Relative 5  0 - 5 %   Eosinophils Absolute 0.2  0.0 - 0.7 K/uL   Basophils Relative 0  0 - 1 %   Basophils Absolute 0.0  0.0 - 0.1 K/uL  COMPREHENSIVE METABOLIC PANEL     Status: Abnormal   Collection Time    03/11/14 11:45 AM      Result Value Ref Range   Sodium 140  137 - 147 mEq/L   Potassium 3.5 (*) 3.7 - 5.3 mEq/L   Chloride 102  96 - 112 mEq/L   CO2 27  19 - 32 mEq/L   Glucose, Bld 88  70 - 99 mg/dL   BUN 14  6 - 23 mg/dL   Creatinine, Ser 1.34 (*) 0.50 - 1.10 mg/dL   Calcium 9.7  8.4 - 10.5 mg/dL   Total Protein 7.6  6.0 - 8.3 g/dL   Albumin 2.9 (*) 3.5 - 5.2 g/dL   AST 19  0 - 37 U/L   ALT 6  0 - 35 U/L   Alkaline Phosphatase 107  39 - 117 U/L   Total Bilirubin 1.1  0.3 - 1.2 mg/dL   GFR calc non Af Amer 45 (*) >90 mL/min   GFR calc Af Amer 52 (*) >90 mL/min   Comment: (NOTE)     The eGFR has been calculated using the CKD EPI equation.     This calculation has not been validated in all clinical situations.     eGFR's persistently <90 mL/min signify possible Chronic Kidney     Disease.   Anion gap 11  5 - 15  PRO B NATRIURETIC PEPTIDE     Status: Abnormal   Collection Time    03/11/14 11:45 AM      Result Value Ref Range   Pro B Natriuretic peptide (BNP) 52920.0 (*) 0 - 125 pg/mL  I-STAT TROPOININ, ED     Status: None   Collection Time    03/11/14 12:00 PM      Result Value Ref Range   Troponin i, poc 0.01   0.00 - 0.08 ng/mL   Comment 3            Comment: Due to the release kinetics of cTnI,     a negative result within the first hours     of the onset of symptoms does not rule out     myocardial infarction with certainty.     If myocardial infarction is still suspected,     repeat the test at appropriate intervals.  URINALYSIS, ROUTINE W REFLEX MICROSCOPIC     Status: Abnormal   Collection Time    03/11/14 12:00 PM      Result Value Ref Range   Color, Urine YELLOW  YELLOW   APPearance CLOUDY (*) CLEAR   Specific Gravity, Urine 1.008  1.005 - 1.030   pH 6.0  5.0 - 8.0   Glucose, UA NEGATIVE  NEGATIVE mg/dL   Hgb urine dipstick TRACE (*) NEGATIVE   Bilirubin Urine NEGATIVE  NEGATIVE   Ketones, ur 15 (*) NEGATIVE mg/dL   Protein, ur 100 (*) NEGATIVE mg/dL   Urobilinogen, UA 0.2  0.0 - 1.0 mg/dL   Nitrite NEGATIVE  NEGATIVE   Leukocytes, UA MODERATE (*) NEGATIVE  URINE MICROSCOPIC-ADD ON     Status: Abnormal   Collection Time    03/11/14 12:00 PM      Result Value Ref Range   Squamous Epithelial / LPF RARE  RARE   WBC, UA 7-10  <3 WBC/hpf   RBC / HPF 0-2  <3 RBC/hpf   Bacteria, UA MANY (*) RARE  I-STAT CHEM 8, ED     Status: Abnormal   Collection Time    03/11/14 12:02 PM      Result Value Ref Range   Sodium 137  137 - 147 mEq/L   Potassium 3.2 (*) 3.7 - 5.3 mEq/L   Chloride 103  96 - 112 mEq/L   BUN 13  6 - 23 mg/dL   Creatinine, Ser 1.40 (*) 0.50 - 1.10 mg/dL   Glucose, Bld 91  70 - 99 mg/dL   Calcium, Ion 1.25 (*) 1.12 - 1.23 mmol/L   TCO2 26  0 - 100 mmol/L   Hemoglobin 12.6  12.0 - 15.0 g/dL   HCT 37.0  36.0 - 46.0 %    Imaging results:  Dg Chest Portable 1 View  03/11/2014   CLINICAL DATA:  Left-sided chest pain and difficulty breathing; hypertension  EXAM: PORTABLE CHEST - 1 VIEW  COMPARISON:  February 23, 2014  FINDINGS: There is generalized cardiomegaly with pulmonary venous hypertension. There is extensive edema diffusely. Central catheter tip is at the  cavoatrial junction. No pneumothorax. No adenopathy apparent.  IMPRESSION: Congestive heart failure, progressed from prior study. No pneumothorax.   Electronically Signed   By: Lowella Grip M.D.   On: 03/11/2014 11:28    Other results: EKG 03/11/14 shows:  Sinus rhythm Left anterior fascicular block Probable left ventricular hypertrophy Anterior Q waves, possibly due to LVH Prolonged QT interval  Assessment & Plan by Problem: Principal Problem:  Chest pain of uncertain etiology Active Problems:   Type 2 diabetes mellitus with complication   Anxiety state   GERD   Morbid obesity   Essential hypertension   End-stage renal disease on hemodialysis   History of CVA (cerebrovascular accident)   History of seizure   Acute on chronic systolic heart failure  Ms. Elgersma is a 53 year old female with multiple co-morbidities who presented to the ED by EMS after experiencing stabbing and burning chest pain during dialysis on 10/01.   1. CHF/ unspecified chest pain:  GERD vs ACS vs acute decompensation of CHF vs MSK vs Psychogenic cause. CHF is likely the cause her chest pain; most recent EF is 20%, a CXR showed extensive edema diffusely and pulmonary venous hypertension.   PE is less likely as the pt is on coumadin, did not endorse SOB, no hemoptysis, no clinical symptoms of DVT and her HR has been > 100 bpm although she is fairly immobile. ACS is less likely as her ECG showed no acute changes GERD patient has a history of reflux , chest pain was only a 4/10 was not able to assess the nature of her chest pain so this is a possible cause. MSK cause is less likely as the pain is not persistent and she did not endorse any pain on palpation during exam.  Psychogenic, not likely as patient did not endorse any new stressors and her anxiety and depression are currently being treated with Zoloft and Ativan.   - monitor troponin I  - cardiac monitoring . - consider PPI trial   2. Headache: could  possibly be dialysis associated vs stroke vs generalized headache.  Patient has had multiple strokes in the past with remaining functional deficits but does not present with new functional decifits .Meningitis less likely patients current WBC 5.4, and shows no sign of sepsis  - PRN   3. Acute encephalopathy: toxic metabolic vs hypoglycemia vs sleep deprivation.  Collateral obtained from patient's sister: patient was recently hospitalized 2 weeks ago for a UTI.  U/A  Although nitrite negative there were rmoderate leukocytes and many bacteria. Patient did not eat prior to dialysis this morning and has been without food since presenting to the ED,  Last glucose was 91 so this a less likely cause.  Sleep deprivation is most likely, from collateral obtained patients mentation decreases with lack of sleep.   -  Consider obtaining neuro consult if mental status does not improve   4. ESRD, patient just finished 4th dialysis treatment. Hypokalemic at 3.2 on admission, will continue to monitor for acute changes and defer to nephrology for management if further action is required. Receives dialysis thru [facility] - nephro consult for dialysis  - continue dialysis schedule of Tues, Thursday and Saturday  - Monitor BMet  5. Diabetes/ Peripheral neuropathy - continue lyrica  - Check A1c   6. Sleep apnea: - continue home BPAP at night   7. HTN/ Dyslipidemia:  continue home meds atovastatin, carvedilol, hydralazine, isordil and metoprolol   8. Depression/Anxiety - continue home zoloft and ativan   This is a Careers information officer Note.  The care of the patient was discussed with Dr. Redmond Pulling and Dr. Sherrine Maples and the assessment and plan was formulated with their assistance.  Please see their note for official documentation of the patient encounter.   Signed: Linton Ham, Med Student 03/11/2014, 4:31 PM

## 2014-03-11 NOTE — ED Notes (Signed)
At hemo diaylsis; 30 minutes left and reported chest pain (a pressure below left breast). Dialysis stopped. Given 324mg  ASA and 1 nitro by EMS; denies any chest pain currently. Denies N/V, sweating, or pain radiation.

## 2014-03-11 NOTE — ED Provider Notes (Signed)
CSN: [id number]     Arrival date & time 03/11/14  1052 History   First MD Initiated Contact with Patient 03/11/14 1058     Chief Complaint  Patient presents with  . Chest Pain     (Consider location/radiation/quality/duration/timing/severity/associated sxs/prior Treatment) The history is provided by the patient.  [name] is a 53 y.o. female hx of HL, CVA who is bedbound with indwelling foley, HL, obesity here with chest pain. Was at dialysis and 30 min from finishing dialysis when she had substernal chest pain. Denies radiation. Was 18/10 when EMS arrived. She was given ASA 325 mg and 1 nitro and now is pain free. She states that she may have some fever today as well and cough. No vomiting or abdominal pain.   Level V caveat- stroke    Past Medical History  Diagnosis Date  . HTN (hypertension)   . HLD (hyperlipidemia)   . History of methicillin resistant staphylococcus aureus (MRSA)   . CVA (cerebral infarction)   . Anemia   . Tachycardia   . Obesity   . Lower back pain     Chronic  . Idiopathic peripheral neuropathy     NOS  . DJD (degenerative joint disease)   . Headache(784.0)   . GERD (gastroesophageal reflux disease)   . Depression   . Anxiety   . Hypokalemia     resolved  . Cerebral infarct     left parietal and bilateral  . Esophagitis     distal  . Fluid overload     compensated  . Urinary retention   . H/O: pneumonia   . History of bacteremia   . Surgery, other elective     of the ear  . Stroke     Hx of left frontoparietal stroke in 12/11. History of right brain stroke.  . Cardiomyopathy     Echocardiogram 04/23/11: Mild LVH, EF 30-35%, mild MR, mild LAE, mild-moderate RVE.  TEE 04/25/11: Moderate LVH, severe biventricular dysfunction, EF 20-25%, moderate to severe MR, moderate TR, biatrial enlargement, no PFO, negative bubble study  . Shortness of breath     with exertion   . Sleep apnea     no machine used  pt does not remember test for sleep  apnea  . DM type 2 (diabetes mellitus, type 2)     type two   oral meds only   . 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 UTI (lower urinary tract infection) 10/19/11    had approx April 2013  . Osteomyelitis   . DJD (degenerative joint disease)   . Urinary retention   . Pneumonia   . Peripheral vascular disease    Past Surgical History  Procedure Laterality Date  . Tympanoplasty    . Toe amputation      left 2nd toe  . Dilation and curettage of uterus      history  . Tee without cardioversion  04/25/2011    Procedure: TRANSESOPHAGEAL ECHOCARDIOGRAM (TEE);  Surgeon: Jolaine Artist, MD;  Location: [facility] ENDOSCOPY;  Service: Cardiovascular;  Laterality: N/A;  . Esophagogastroduodenoscopy  08/14/2011    Procedure: ESOPHAGOGASTRODUODENOSCOPY (EGD);  Surgeon: Scarlette Shorts, MD;  Location: [facility] ENDOSCOPY;  Service: Endoscopy;  Laterality: N/A;  . Amputation  01/18/2012    Procedure: AMPUTATION BELOW KNEE;  Surgeon: Newt Minion, MD;  Location: [place];  Service: Orthopedics;  Laterality: Left;  Left Below Knee Amputation  . Amputation Right 08/01/2012  Procedure: Right Below Knee Amputation;  Surgeon: Newt Minion, MD;  Location: [place];  Service: Orthopedics;  Laterality: Right;  Right Below Knee Amputation  . Pars plana vitrectomy Left 01/28/2013    Procedure: PARS PLANA VITRECTOMY WITH 23 GAUGE;  Surgeon: Adonis Brook, MD;  Location: [place];  Service: Ophthalmology;  Laterality: Left;  . Photocoagulation with laser Left 01/28/2013    Procedure: PHOTOCOAGULATION WITH LASER;  Surgeon: Adonis Brook, MD;  Location: [place];  Service: Ophthalmology;  Laterality: Left;  ENDOLASER  . Membrane peel Left 01/28/2013    Procedure: MEMBRANE PEEL;  Surgeon: Adonis Brook, MD;  Location: [place];  Service: Ophthalmology;  Laterality: Left;  . Amputation Left 03/18/2013    Procedure: AMPUTATION ABOVE KNEE;  Surgeon: Newt Minion, MD;  Location: [place];  Service: Orthopedics;  Laterality: Left;   Family  History  Problem Relation Age of Onset  . Coronary artery disease    . Diabetes    . Cancer Mother   . Diabetes Mother   . Heart disease Father   . Hyperlipidemia Father   . Hypertension Father   . Stroke Father   . Heart disease Brother   . Anesthesia problems Neg Hx   . Hypotension Neg Hx   . Malignant hyperthermia Neg Hx   . Pseudochol deficiency Neg Hx    History  Substance Use Topics  . Smoking status: Never Smoker   . Smokeless tobacco: Never Used  . Alcohol Use: Yes     Comment: rare on holidays   OB History   Grav Para Term Preterm Abortions TAB SAB Ect Mult Living                 Review of Systems  Cardiovascular: Positive for chest pain.  All other systems reviewed and are negative.     Allergies  Ace inhibitors; Latex; Lisinopril; and Vancomycin  Home Medications   Prior to Admission medications   Medication Sig Start Date End Date Taking? Authorizing Provider  amLODipine (NORVASC) 5 MG tablet Take 5 mg by mouth daily.    Historical Provider, MD  azithromycin (ZITHROMAX) 250 MG tablet Take 250-500 mg by mouth daily. 5 day course started 03/11/13 - take 2 tablets on day 1, then take 1 tablet daily on days 2-5    Historical Provider, MD  chlorhexidine (PERIDEX) 0.12 % solution Use as directed 15 mL in the mouth or throat 2 (two) times daily. Swish for 30 seconds    Historical Provider, MD  cycloSPORINE (RESTASIS) 0.05 % ophthalmic emulsion Place 1 drop into both eyes 2 (two) times daily. 8am and 4pm    Historical Provider, MD  feeding supplement (PRO-STAT SUGAR FREE 64) LIQD Take 30 mLs by mouth 2 (two) times daily.    Historical Provider, MD  ferrous sulfate 325 (65 FE) MG tablet Take 325 mg by mouth 2 (two) times daily.     Historical Provider, MD  furosemide (LASIX) 20 MG tablet Take 20 mg by mouth 2 (two) times daily.    Historical Provider, MD  gabapentin (NEURONTIN) 300 MG capsule Take 600 mg by mouth 4 (four) times daily.    Historical Provider, MD   gatifloxacin (ZYMAXID) 0.5 % SOLN Place 1 drop into the left eye 4 (four) times daily.    Historical Provider, MD  HYDROcodone-acetaminophen (NORCO/VICODIN) 5-325 MG per tablet Take 1 tablet by mouth every 6 (six) hours as needed for pain.    Historical Provider, MD  insulin aspart (NOVOLOG)  100 UNIT/ML injection Inject 5 Units into the skin 4 (four) times daily -  before meals and at bedtime. 5 units for blood sugar>150    Historical Provider, MD  levETIRAcetam (KEPPRA) 500 MG tablet Take 500 mg by mouth 2 (two) times daily.    Historical Provider, MD  levETIRAcetam (KEPPRA) 750 MG tablet Take 1 tablet (750 mg total) by mouth 2 (two) times daily. 03/19/13   Kinnie Feil, MD  methylphenidate (RITALIN) 5 MG tablet Take 5 mg by mouth daily.    Historical Provider, MD  metoprolol (LOPRESSOR) 100 MG tablet Take 100 mg by mouth 2 (two) times daily.    Historical Provider, MD  Multiple Vitamin (MULTIVITAMIN WITH MINERALS) TABS tablet Take 2 tablets by mouth daily. Thera-M substitute for Centrum Silver    Historical Provider, MD  Nutritional Supplements (RESOURCE ARGINAID) PACK Take 1 each by mouth 2 (two) times daily. Mix with 8 ounces of fluid and drink    Historical Provider, MD  oxyCODONE-acetaminophen (PERCOCET/ROXICET) 5-325 MG per tablet Take 1 tablet by mouth every 4 (four) hours as needed for pain.    Historical Provider, MD  Polyethyl Glycol-Propyl Glycol (SYSTANE) 0.4-0.3 % GEL Place 1 drop into both eyes 3 (three) times daily.    Historical Provider, MD  prednisoLONE acetate (PRED FORTE) 1 % ophthalmic suspension Place 1 drop into the left eye 4 (four) times daily.    Historical Provider, MD  pregabalin (LYRICA) 75 MG capsule Take 1 capsule (75 mg total) by mouth 2 (two) times daily. 08/14/13   Adam Melvern Sample, DO  pregabalin (LYRICA) 75 MG capsule Take 1 capsule (75 mg total) by mouth 2 (two) times daily. 08/14/13   Adam Melvern Sample, DO  sertraline (ZOLOFT) 50 MG tablet Take 150 mg by mouth  daily.    Historical Provider, MD  simvastatin (ZOCOR) 20 MG tablet Take 20 mg by mouth at bedtime.    Historical Provider, MD  warfarin (COUMADIN) 6 MG tablet Take 6 mg by mouth at bedtime.    Historical Provider, MD   BP 193/77  Pulse 85  Temp(Src) 98.6 F (37 C) (Oral)  Resp 23  Ht 5' (1.524 m)  Wt 245 lb (111.131 kg)  BMI 47.85 kg/m2  SpO2 99% Physical Exam  Nursing note and vitals reviewed. Constitutional:  Chronically ill   HENT:  Head: Normocephalic.  Eyes: Conjunctivae are normal. Pupils are equal, round, and reactive to light.  Neck: Normal range of motion. Neck supple.  Cardiovascular: Normal rate and normal heart sounds.   Pulmonary/Chest:  Diminished breath sounds bilateral bases   Abdominal: Soft. Bowel sounds are normal. She exhibits no distension. There is no tenderness. There is no rebound.  Foley in place   Musculoskeletal: Normal range of motion.  2+ edema bilaterally. L AKA, R BKA   Neurological: She is alert.  Skin: Skin is warm and dry.  Psychiatric:  Unable     ED Course  Procedures (including critical care time) Labs Review Labs Reviewed  CBC WITH DIFFERENTIAL - Abnormal; Notable for the following:    RBC 3.55 (*)    Hemoglobin 10.1 (*)    HCT 32.3 (*)    RDW 19.0 (*)    Platelets 98 (*)    All other components within normal limits  COMPREHENSIVE METABOLIC PANEL - Abnormal; Notable for the following:    Potassium 3.5 (*)    Creatinine, Ser 1.34 (*)    Albumin 2.9 (*)    GFR calc non  Af Amer 45 (*)    GFR calc Af Amer 52 (*)    All other components within normal limits  URINALYSIS, ROUTINE W REFLEX MICROSCOPIC - Abnormal; Notable for the following:    APPearance CLOUDY (*)    Hgb urine dipstick TRACE (*)    Ketones, ur 15 (*)    Protein, ur 100 (*)    Leukocytes, UA MODERATE (*)    All other components within normal limits  PRO B NATRIURETIC PEPTIDE - Abnormal; Notable for the following:    Pro B Natriuretic peptide (BNP) 52920.0 (*)     All other components within normal limits  URINE MICROSCOPIC-ADD ON - Abnormal; Notable for the following:    Bacteria, UA MANY (*)    All other components within normal limits  I-STAT CHEM 8, ED - Abnormal; Notable for the following:    Potassium 3.2 (*)    Creatinine, Ser 1.40 (*)    Calcium, Ion 1.25 (*)    All other components within normal limits  URINE CULTURE  I-STAT TROPOININ, ED    Imaging Review Dg Chest Portable 1 View  03/11/2014   CLINICAL DATA:  Left-sided chest pain and difficulty breathing; hypertension  EXAM: PORTABLE CHEST - 1 VIEW  COMPARISON:  February 23, 2014  FINDINGS: There is generalized cardiomegaly with pulmonary venous hypertension. There is extensive edema diffusely. Central catheter tip is at the cavoatrial junction. No pneumothorax. No adenopathy apparent.  IMPRESSION: Congestive heart failure, progressed from prior study. No pneumothorax.   Electronically Signed   By: Lowella Grip M.D.   On: 03/11/2014 11:28     EKG Interpretation   Date/Time:  Thursday March 11 2014 11:06:02 EDT Ventricular Rate:  89 PR Interval:  159 QRS Duration: 104 QT Interval:  457 QTC Calculation: 556 R Axis:   -51 Text Interpretation:  Sinus rhythm Left anterior fascicular block Probable  left ventricular hypertrophy Anterior Q waves, possibly due to LVH  Prolonged QT interval Prolonged QT new  Confirmed by Amilliana Hayworth  MD, Darryon Bastin  ([id number]) on 03/11/2014 11:08:41 AM      MDM   Final diagnoses:  None   [name] is a 53 y.o. female here with chest pain. Poor historian. Will get labs, CXR.   2:06 PM CXR showed CHF. BNP elevated. UA contaminated, but has indwelling foley. Urine culture sent. Given lasix, will admit for CHF.      Wandra Arthurs, MD 03/11/14 [phone]

## 2014-03-11 NOTE — ED Notes (Signed)
Patient turned and repositioned. Patient given warm blankets and made comfortable. Alert and Oriented.

## 2014-03-11 NOTE — ED Notes (Signed)
Sister called and left number to call her for an update; Patient gave permission to share information with her sister. Attempted to call her back, no answer.

## 2014-03-11 NOTE — ED Notes (Signed)
Attempted report 

## 2014-03-11 NOTE — H&P (Signed)
Date: 03/11/2014               Patient Name:  [name] MRN: [id number]  DOB: 14-Dec-1960 Age / Sex: 53 y.o., female   PCP: Rogene Houston, MD         Medical Service: Internal Medicine Teaching Service         Attending Physician: Dr. Provider Default, MD    First Contact: Theressa Stamps, MS3 Pager: [phone]  Second Contact: Third Contact:  Dr. Venita Lick Dr. Duwaine Maxin Pager: [phone]        After Hours (After 5p/  First Contact Pager: [phone]  weekends / holidays): Second Contact Pager: [phone]   Chief Complaint: chest pain during dialysis earlier today  History of Present Illness:  [name] is a 53 yo woman with a PMH of ESRD (HD initiated 1 week ago, today was her fourth session on a T/R/Sa schedule), CHF (LVEF 20%), right MCA CVA, left frontoparietal CVA, DMII, HTN and chronic lower back pain who presented to the ED after experiencing chest pain 2 hours and 40 minutes into her 3 hour dialysis session early this morning. Per RN at the dialysis center, she was conversational at the time, and described her chest pain as stabbing and burning and rated it at a 4/10 in intensity. At the time of the pain, her EKG was normal and her vitals were stable (130/54, HR 84, T 98.2, CBG 96). Because of her cardiac history, members of the dialysis team activated EMS. In the ED, she received lasix.  Per her sister, she has been in and out of hospitals over the past several months for CHF exacerbations (most recently at [facility] 2 weeks ago for both fluid overload and UTI).   At the time of the interview, [name] was somnolent and endorsed an occipital headache. She reported that she had come in for a fever and headache at dialysis; she denied any history of chest pain. When asked what her primary problem was, she answered lower back pain. Per her sister, she tends to become lethargic when she hasn't been eating.   Meds: No current facility-administered medications for this  encounter.   Current Outpatient Prescriptions  Medication Sig Dispense Refill  . allopurinol (ZYLOPRIM) 100 MG tablet Take 100 mg by mouth daily.      Marland Kitchen atorvastatin (LIPITOR) 20 MG tablet Take 20 mg by mouth at bedtime.      . Calcium Carbonate-Vitamin D (CALCIUM + D PO) Take 1 tablet by mouth 2 (two) times daily.      . carvedilol (COREG) 3.125 MG tablet Take 3.125 mg by mouth 2 (two) times daily with a meal.      . ferrous sulfate 325 (65 FE) MG tablet Take 325 mg by mouth 2 (two) times daily.       . hydrALAZINE (APRESOLINE) 10 MG tablet Take 10 mg by mouth 3 (three) times daily.      . isosorbide dinitrate (ISORDIL) 20 MG tablet Take 20 mg by mouth 3 (three) times daily.      . isosorbide mononitrate (ISMO,MONOKET) 20 MG tablet Take by mouth 2 (two) times daily at 10 AM and 5 PM.      . levETIRAcetam (KEPPRA) 500 MG tablet Take 500 mg by mouth 2 (two) times daily.      Marland Kitchen LORazepam (ATIVAN) 0.5 MG tablet Take 0.5 mg by mouth every 4 (four) hours as needed for anxiety.      Marland Kitchen  MICONAZOLE NITRATE EX Apply 1 application topically 2 (two) times daily.      . Multiple Vitamin (MULTIVITAMIN WITH MINERALS) TABS tablet Take 1 tablet by mouth daily.       . polyethylene glycol (MIRALAX / GLYCOLAX) packet Take 17 g by mouth daily.      . pregabalin (LYRICA) 75 MG capsule Take 1 capsule (75 mg total) by mouth 2 (two) times daily.  60 capsule  3  . sertraline (ZOLOFT) 50 MG tablet Take 150 mg by mouth daily.      Marland Kitchen spironolactone (ALDACTONE) 12.5 mg TABS tablet Take 12.5 mg by mouth daily.      Marland Kitchen tiZANidine (ZANAFLEX) 4 MG tablet Take 4 mg by mouth every 8 (eight) hours as needed for muscle spasms.      Marland Kitchen torsemide (DEMADEX) 20 MG tablet Take 20 mg by mouth daily.      . traMADol (ULTRAM) 50 MG tablet Take 50 mg by mouth every 8 (eight) hours as needed (for pain).      . traZODone (DESYREL) 50 MG tablet Take 50 mg by mouth at bedtime.      . insulin aspart (NOVOLOG) 100 UNIT/ML injection Inject 5  Units into the skin 4 (four) times daily -  before meals and at bedtime. 5 units for blood sugar>150      . methylphenidate (RITALIN) 5 MG tablet Take 5 mg by mouth daily.      . metoprolol (LOPRESSOR) 100 MG tablet Take 100 mg by mouth 2 (two) times daily.      Marland Kitchen warfarin (COUMADIN) 6 MG tablet Take 6 mg by mouth at bedtime.      . [DISCONTINUED] potassium chloride SA (K-DUR,KLOR-CON) 20 MEQ tablet Take 20 mEq by mouth daily.      . [DISCONTINUED] promethazine (PHENERGAN) 25 MG/ML injection Inject 25 mg into the muscle every 6 (six) hours as needed. For nausea/vomiting        Allergies: Allergies as of 03/11/2014 - Review Complete 03/11/2014  Allergen Reaction Noted  . Ace inhibitors Shortness Of Breath 04/26/2011  . Latex Hives 10/19/2011  . Lisinopril Other (See Comments) 09/13/2011  . Vancomycin Other (See Comments) 10/10/2010   Past Medical History  Diagnosis Date  . HTN (hypertension)   . HLD (hyperlipidemia)   . History of methicillin resistant staphylococcus aureus (MRSA)   . CVA (cerebral infarction)   . Anemia   . Tachycardia   . Obesity   . Lower back pain     Chronic  . Idiopathic peripheral neuropathy     NOS  . DJD (degenerative joint disease)   . Headache(784.0)   . GERD (gastroesophageal reflux disease)   . Depression   . Anxiety   . Hypokalemia     resolved  . Cerebral infarct     left parietal and bilateral  . Esophagitis     distal  . Fluid overload     compensated  . Urinary retention   . H/O: pneumonia   . History of bacteremia   . Surgery, other elective     of the ear  . Stroke     Hx of left frontoparietal stroke in 12/11. History of right brain stroke.  . Cardiomyopathy     Echocardiogram 04/23/11: Mild LVH, EF 30-35%, mild MR, mild LAE, mild-moderate RVE.  TEE 04/25/11: Moderate LVH, severe biventricular dysfunction, EF 20-25%, moderate to severe MR, moderate TR, biatrial enlargement, no PFO, negative bubble study  . Shortness of breath  with exertion   . Sleep apnea     no machine used  pt does not remember test for sleep apnea  . DM type 2 (diabetes mellitus, type 2)     type two   oral meds only   . 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 UTI (lower urinary tract infection) 10/19/11    had approx April 2013  . Osteomyelitis   . DJD (degenerative joint disease)   . Urinary retention   . Pneumonia   . Peripheral vascular disease    Past Surgical History  Procedure Laterality Date  . Tympanoplasty    . Toe amputation      left 2nd toe  . Dilation and curettage of uterus      history  . Tee without cardioversion  04/25/2011    Procedure: TRANSESOPHAGEAL ECHOCARDIOGRAM (TEE);  Surgeon: Jolaine Artist, MD;  Location: [facility] ENDOSCOPY;  Service: Cardiovascular;  Laterality: N/A;  . Esophagogastroduodenoscopy  08/14/2011    Procedure: ESOPHAGOGASTRODUODENOSCOPY (EGD);  Surgeon: Scarlette Shorts, MD;  Location: [facility] ENDOSCOPY;  Service: Endoscopy;  Laterality: N/A;  . Amputation  01/18/2012    Procedure: AMPUTATION BELOW KNEE;  Surgeon: Newt Minion, MD;  Location: [place];  Service: Orthopedics;  Laterality: Left;  Left Below Knee Amputation  . Amputation Right 08/01/2012    Procedure: Right Below Knee Amputation;  Surgeon: Newt Minion, MD;  Location: [place];  Service: Orthopedics;  Laterality: Right;  Right Below Knee Amputation  . Pars plana vitrectomy Left 01/28/2013    Procedure: PARS PLANA VITRECTOMY WITH 23 GAUGE;  Surgeon: Adonis Brook, MD;  Location: [place];  Service: Ophthalmology;  Laterality: Left;  . Photocoagulation with laser Left 01/28/2013    Procedure: PHOTOCOAGULATION WITH LASER;  Surgeon: Adonis Brook, MD;  Location: [place];  Service: Ophthalmology;  Laterality: Left;  ENDOLASER  . Membrane peel Left 01/28/2013    Procedure: MEMBRANE PEEL;  Surgeon: Adonis Brook, MD;  Location: [place];  Service: Ophthalmology;  Laterality: Left;  . Amputation Left 03/18/2013    Procedure: AMPUTATION ABOVE KNEE;  Surgeon:  Newt Minion, MD;  Location: [place];  Service: Orthopedics;  Laterality: Left;   Family History  Problem Relation Age of Onset  . Coronary artery disease    . Diabetes    . Cancer Mother   . Diabetes Mother   . Heart disease Father   . Hyperlipidemia Father   . Hypertension Father   . Stroke Father   . Heart disease Brother   . Anesthesia problems Neg Hx   . Hypotension Neg Hx   . Malignant hyperthermia Neg Hx   . Pseudochol deficiency Neg Hx    History   Social History  . Marital Status: Divorced    Spouse Name: N/A    Number of Children: N/A  . Years of Education: N/A   Occupational History  . Not on file.   Social History Main Topics  . Smoking status: Never Smoker   . Smokeless tobacco: Never Used  . Alcohol Use: Yes     Comment: rare on holidays  . Drug Use: No  . Sexual Activity: Not Currently   Other Topics Concern  . Not on file   Social History Narrative   Divorced   One of 10 children   5 in [place] and was living with sister at time of CVA   Nonsmoker   Nondrinker    Review of Systems:  General: has lived at [facility] (SNF) for 3 years Skin: patient does not report new lesions or rashes HEENT: headache, chronic poor vision Cardiac: no chest pain (though CP reported by HD center) Respiratory: uses 2-3L O2 [place] at home with bipap at night GI: no changes in BMs, no pain, no nausea or vomiting Urinary: has indwelling catheter; started dialysis on 03/02/14  Msk: h/o right BKA and left AKA (revised BKA) Psychiatric: history of depression  Physical Exam: Blood pressure 155/120, pulse 82, temperature 98.6 F (37 C), temperature source Oral, resp. rate 20, height 5' (1.524 m), weight 245 lb (111.131 kg), SpO2 97.00%. Appearance: morbidly obese woman lying toward the left side of her bed with arm sandwiched between the mattress and arm of bed HEENT: AT/[place], PERRL, EOMi, drooling profusely Heart: RRR, normal S1S2 Lungs: scattered rhonchi, diminished breath  sounds throughout Abdomen: obese abdomen, diminished abdominal sounds but BS present, nontender, pannus covered by white cream (difficult to visualize), but malodorus GU: foley in place  Extremities: right BKA and left AKA, well-healed. Right arm atrophied in comparison to left arm  Neurologic: somnolent but A&Ox3, strength 4/5 RUE and 1/5 LUE, CN II-XI intact Skin: unable to turn patient without nursing help  Lab results: Basic Metabolic Panel:  Recent Labs  03/11/14 1145 03/11/14 1202  NA 140 137  K 3.5* 3.2*  CL 102 103  CO2 27  --   GLUCOSE 88 91  BUN 14 13  CREATININE 1.34* 1.40*  CALCIUM 9.7  --    Liver Function Tests:  Recent Labs  03/11/14 1145  AST 19  ALT 6  ALKPHOS 107  BILITOT 1.1  PROT 7.6  ALBUMIN 2.9*   CBC:  Recent Labs  03/11/14 1145 03/11/14 1202  WBC 5.4  --   NEUTROABS 3.4  --   HGB 10.1* 12.6  HCT 32.3* 37.0  MCV 91.0  --   PLT 98*  --    BNP:  Recent Labs  03/11/14 1145  PROBNP 52920.0*   Urine Drug Screen: Drugs of Abuse     Component Value Date/Time   LABOPIA NONE DETECTED 04/23/2011 0714   COCAINSCRNUR NONE DETECTED 04/23/2011 0714   LABBENZ NONE DETECTED 04/23/2011 0714   AMPHETMU NONE DETECTED 04/23/2011 0714   THCU NONE DETECTED 04/23/2011 0714   LABBARB NONE DETECTED 04/23/2011 0714    Urinalysis:  Recent Labs  03/11/14 1200  COLORURINE YELLOW  LABSPEC 1.008  PHURINE 6.0  GLUCOSEU NEGATIVE  HGBUR TRACE*  BILIRUBINUR NEGATIVE  KETONESUR 15*  PROTEINUR 100*  UROBILINOGEN 0.2  NITRITE NEGATIVE  LEUKOCYTESUR MODERATE*   Imaging results:  Dg Chest Portable 1 View  03/11/2014   CLINICAL DATA:  Left-sided chest pain and difficulty breathing; hypertension  EXAM: PORTABLE CHEST - 1 VIEW  COMPARISON:  February 23, 2014  FINDINGS: There is generalized cardiomegaly with pulmonary venous hypertension. There is extensive edema diffusely. Central catheter tip is at the cavoatrial junction. No pneumothorax. No  adenopathy apparent.  IMPRESSION: Congestive heart failure, progressed from prior study. No pneumothorax.   Electronically Signed   By: Lowella Grip M.D.   On: 03/11/2014 11:28   Other results: EKG: NSR, rate 89, LVH, anterior q waves, similar to 01/2013 EKG  Assessment & Plan by Problem: Principal Problem:   Chest pain of uncertain etiology Active Problems:   Type 2 diabetes mellitus with complication   Anxiety state   GERD   Morbid obesity   Essential hypertension   End-stage renal disease on hemodialysis  History of CVA (cerebrovascular accident)   History of seizure   Acute on chronic systolic heart failure [name] is a 53 yo woman with multiple co-morbidities who presented from dialysis with sharp, burning chest pain this morning. Given her risk factors, the differential includes ACS, chest pain related to her acute on chronic systolic heart failure and GERD. She has been found to have a negative i-stat troponin and unchanged EKG; she has no history of ACS. Her XR had extensive edema diffusely and her proBNP was elevated to [address]; however, the proBNP is frequently elevated in patients with CKD and the range of post-dialysis pro-BNP varies widely with abnormal distribution. She also has a history of GERD and is not currently on any medical management for it; the burning quality of her pain makes this likely.  Chest Pain of Uncertain Etiology in the context of CHF: The patient described her pain as sharp and burning and 4/10 in intensity; it responded to aspirin and nitro (given by EMS) and her pain has not recurred. The patient does also have a history of GERD.  - Trend troponins - Continue nitro TID - Tele - EKG in am - Lipid panel in am  Acute on Chronic Systolic Heart Failure: Most recent LVEF 20%.  - Continue home spironolactone, hydralazine, carvedilol and torsemide  GERD: Has diagnosis of GERD, but does not take  - Started protonix; consider sending her home on this  medication  DMII: Most recent A1c in our records is 5.3% (but has been as high as 13.9%) with peripheral neuropathy and complications that led to her BKA and AKA. On 5 novolog with meals at home. - HgbA1c - Continue home Lyrica - Continue ISS (sensitive renal), sliding scale at bedtime  ESRD: On Tuesday, Thursday, Saturday dialysis through [facility]. First started on 9/22, received 4th treatment today.  - Continue HD if patient is still here on Saturday - Consult renal  Essential Hypertension: - Continue home carvedilol, hydralazine, spironolactone, torsemide  History of Multiple CVAs: Cognitive slowing and left-sided weakness. Patient is on coumadin, insulin and atorvastatin.  Atrial Fibrillation: Documented in [facility] records. - Continue home coumadin; patient also has a history of DVT  Morbid Obesity: bed bound at SNF  - OT to evaluate and treat the patient  History of Seizure: - Continue home keppra  Sleep Apnea: - Continue home bipap at night  Depression and Anxiety:  - Continue home ativan and sertraline  DVT Ppx: - Patient is on coumadin  Diet:  - HH / Carb Mod   Dispo: Disposition is deferred at this time, awaiting improvement of current medical problems. Anticipated discharge in approximately 1-2 day(s).   The patient does have a current PCP Rogene Houston, MD) and does not need an [facility] hospital follow-up appointment after discharge.  The patient does have transportation limitations that hinder transportation to clinic appointments.  Signed: Drucilla Schmidt, MD 03/11/2014, 5:24 PM

## 2014-03-11 NOTE — Progress Notes (Addendum)
ANTICOAGULATION CONSULT NOTE - Initial Consult  Pharmacy Consult for Coumadin Indication: CVA history  Allergies  Allergen Reactions  . Ace Inhibitors Shortness Of Breath  . Latex Hives  . Lisinopril Other (See Comments)    Per MAR  . Vancomycin Other (See Comments)    Per [facility]    Patient Measurements: Height: 5' (152.4 cm) (bilat amputee) Weight: 245 lb (111.131 kg) IBW/kg (Calculated) : 45.5  Vital Signs: Temp: 98.6 F (37 C) (10/01 1106) Temp Source: Oral (10/01 1106) BP: 166/98 mmHg (10/01 1830) Pulse Rate: 84 (10/01 1830)  Labs:  Recent Labs  03/11/14 1145 03/11/14 1202  HGB 10.1* 12.6  HCT 32.3* 37.0  PLT 98*  --   CREATININE 1.34* 1.40*    Estimated Creatinine Clearance: 53.8 ml/min (by C-G formula based on Cr of 1.4).   Medical History: Past Medical History  Diagnosis Date  . HTN (hypertension)   . HLD (hyperlipidemia)   . History of methicillin resistant staphylococcus aureus (MRSA)   . CVA (cerebral infarction)   . Anemia   . Tachycardia   . Obesity   . Lower back pain     Chronic  . Idiopathic peripheral neuropathy     NOS  . DJD (degenerative joint disease)   . Headache(784.0)   . GERD (gastroesophageal reflux disease)   . Depression   . Anxiety   . Hypokalemia     resolved  . Cerebral infarct     left parietal and bilateral  . Esophagitis     distal  . Fluid overload     compensated  . Urinary retention   . H/O: pneumonia   . History of bacteremia   . Surgery, other elective     of the ear  . Stroke     Hx of left frontoparietal stroke in 12/11. History of right brain stroke.  . Cardiomyopathy     Echocardiogram 04/23/11: Mild LVH, EF 30-35%, mild MR, mild LAE, mild-moderate RVE.  TEE 04/25/11: Moderate LVH, severe biventricular dysfunction, EF 20-25%, moderate to severe MR, moderate TR, biatrial enlargement, no PFO, negative bubble study  . Shortness of breath     with exertion   . Sleep apnea     no machine used  pt does  not remember test for sleep apnea  . DM type 2 (diabetes mellitus, type 2)     type two   oral meds only   . 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 UTI (lower urinary tract infection) 10/19/11    had approx April 2013  . Osteomyelitis   . DJD (degenerative joint disease)   . Urinary retention   . Pneumonia   . Peripheral vascular disease     Assessment: 53 year old female on Coumadin PTA for stroke history admitted with CP She is bed-bound with a indwelling foley  Also with ESRD Dose PTA = 6 mg daily Admit INR pending  Goal of Therapy:  INR 2-3 Monitor platelets by anticoagulation protocol: Yes   Plan:  Await admit INR for further dosing Daily INR  Thank you. Anette Guarneri, PharmD [phone]   03/11/2014,7:07 PM

## 2014-03-11 NOTE — Progress Notes (Signed)
Attempted report from ED ?

## 2014-03-11 NOTE — Progress Notes (Signed)
This makes the 3rd time RT has checked with Pt to see if she is ready to go on the BIPAP.  Pt stated for RT to come back in 1 hr even though Pt has already been sleeping.  RT will come back at [place].  RT to monitor and assess as needed.

## 2014-03-12 ENCOUNTER — Encounter (HOSPITAL_COMMUNITY): Payer: Self-pay | Admitting: General Practice

## 2014-03-12 DIAGNOSIS — E119 Type 2 diabetes mellitus without complications: Secondary | ICD-10-CM

## 2014-03-12 DIAGNOSIS — K219 Gastro-esophageal reflux disease without esophagitis: Secondary | ICD-10-CM

## 2014-03-12 DIAGNOSIS — Z8673 Personal history of transient ischemic attack (TIA), and cerebral infarction without residual deficits: Secondary | ICD-10-CM

## 2014-03-12 DIAGNOSIS — N185 Chronic kidney disease, stage 5: Secondary | ICD-10-CM

## 2014-03-12 DIAGNOSIS — I10 Essential (primary) hypertension: Secondary | ICD-10-CM

## 2014-03-12 DIAGNOSIS — I4891 Unspecified atrial fibrillation: Secondary | ICD-10-CM

## 2014-03-12 DIAGNOSIS — A047 Enterocolitis due to Clostridium difficile: Secondary | ICD-10-CM

## 2014-03-12 DIAGNOSIS — F418 Other specified anxiety disorders: Secondary | ICD-10-CM

## 2014-03-12 DIAGNOSIS — R0789 Other chest pain: Secondary | ICD-10-CM

## 2014-03-12 DIAGNOSIS — I5023 Acute on chronic systolic (congestive) heart failure: Principal | ICD-10-CM

## 2014-03-12 LAB — COMPREHENSIVE METABOLIC PANEL
ALT: 6 U/L (ref 0–35)
AST: 13 U/L (ref 0–37)
Albumin: 2.7 g/dL — ABNORMAL LOW (ref 3.5–5.2)
Alkaline Phosphatase: 105 U/L (ref 39–117)
Anion gap: 10 (ref 5–15)
BILIRUBIN TOTAL: 0.9 mg/dL (ref 0.3–1.2)
BUN: 16 mg/dL (ref 6–23)
CHLORIDE: 100 meq/L (ref 96–112)
CO2: 28 mEq/L (ref 19–32)
CREATININE: 1.62 mg/dL — AB (ref 0.50–1.10)
Calcium: 9.6 mg/dL (ref 8.4–10.5)
GFR calc Af Amer: 41 mL/min — ABNORMAL LOW (ref >90)
GFR calc non Af Amer: 36 mL/min — ABNORMAL LOW (ref >90)
Glucose, Bld: 73 mg/dL (ref 70–99)
POTASSIUM: 3.5 meq/L — AB (ref 3.7–5.3)
Sodium: 138 mEq/L (ref 137–147)
Total Protein: 6.9 g/dL (ref 6.0–8.3)

## 2014-03-12 LAB — LIPID PANEL
CHOL/HDL RATIO: 2.5 ratio
Cholesterol: 82 mg/dL (ref 0–200)
HDL: 33 mg/dL — ABNORMAL LOW (ref >39)
LDL CALC: 34 mg/dL (ref 0–99)
Triglycerides: 77 mg/dL (ref <150)
VLDL: 15 mg/dL (ref 0–40)

## 2014-03-12 LAB — HEMOGLOBIN A1C
Hgb A1c MFr Bld: 5.3 % (ref <5.7)
MEAN PLASMA GLUCOSE: 105 mg/dL (ref <117)

## 2014-03-12 LAB — CBC
HEMATOCRIT: 31.4 % — AB (ref 36.0–46.0)
Hemoglobin: 9.6 g/dL — ABNORMAL LOW (ref 12.0–15.0)
MCH: 28.3 pg (ref 26.0–34.0)
MCHC: 30.6 g/dL (ref 30.0–36.0)
MCV: 92.6 fL (ref 78.0–100.0)
Platelets: 120 10*3/uL — ABNORMAL LOW (ref 150–400)
RBC: 3.39 MIL/uL — AB (ref 3.87–5.11)
RDW: 19.4 % — AB (ref 11.5–15.5)
WBC: 4.3 10*3/uL (ref 4.0–10.5)

## 2014-03-12 LAB — URINE CULTURE: Colony Count: >=100000

## 2014-03-12 LAB — TROPONIN I

## 2014-03-12 LAB — GLUCOSE, CAPILLARY
GLUCOSE-CAPILLARY: 77 mg/dL (ref 70–99)
Glucose-Capillary: 119 mg/dL — ABNORMAL HIGH (ref 70–99)
Glucose-Capillary: 119 mg/dL — ABNORMAL HIGH (ref 70–99)
Glucose-Capillary: 106 mg/dL — ABNORMAL HIGH (ref 70–99)

## 2014-03-12 LAB — PROTIME-INR
INR: 1.41 (ref 0.00–1.49)
Prothrombin Time: 17.3 seconds — ABNORMAL HIGH (ref 11.6–15.2)

## 2014-03-12 LAB — CLOSTRIDIUM DIFFICILE BY PCR: CDIFFPCR: POSITIVE — AB

## 2014-03-12 MED ORDER — FUROSEMIDE 10 MG/ML IJ SOLN
80.0000 mg | Freq: Three times a day (TID) | INTRAMUSCULAR | Status: DC
  Administered 2014-03-12 – 2014-03-15 (×9): 80 mg via INTRAVENOUS
  Filled 2014-03-12 (×12): qty 8

## 2014-03-12 MED ORDER — VANCOMYCIN 50 MG/ML ORAL SOLUTION
125.0000 mg | Freq: Four times a day (QID) | ORAL | Status: DC
  Administered 2014-03-12 – 2014-03-19 (×25): 125 mg via ORAL
  Filled 2014-03-12 (×31): qty 2.5

## 2014-03-12 MED ORDER — WARFARIN - PHARMACIST DOSING INPATIENT
Freq: Every day | Status: DC
  Administered 2014-03-16: 18:00:00

## 2014-03-12 MED ORDER — WARFARIN SODIUM 10 MG PO TABS
10.0000 mg | ORAL_TABLET | Freq: Once | ORAL | Status: AC
  Administered 2014-03-12: 10 mg via ORAL
  Filled 2014-03-12: qty 1

## 2014-03-12 MED ORDER — FUROSEMIDE 10 MG/ML IJ SOLN
40.0000 mg | Freq: Once | INTRAMUSCULAR | Status: AC
  Administered 2014-03-12: 40 mg via INTRAVENOUS
  Filled 2014-03-12: qty 4

## 2014-03-12 NOTE — Progress Notes (Signed)
Utilization review completed. Montina Dorrance, RN, BSN. 

## 2014-03-12 NOTE — Progress Notes (Signed)
Subjective: Patient was seen by the team today on morning rounds.  Today she is more alert and is able to give a better picture of what brought her into the hospital. She says that she did experience some chest pain with HD but reports that fever and headache were more of an issue. Currently do not have any recorded fevers. Today reports that she is having issues breathing, although she is stating well on 3L of oxygen via nasal canula and she says that she feels feverish.  She stated that overnight she has been experiencing some diarrhea.  She also reports new symptoms of burning of with urination.  Her occipital headache from yesterday has resolved.    Objective: Vital signs in last 24 hours: Filed Vitals:   03/11/14 2115 03/11/14 2117 03/12/14 0500 03/12/14 1024  BP: 150/48  119/62 114/45  Pulse: 82 82 84 92  Temp: 98 F (36.7 C)  98.6 F (37 C)   TempSrc:   Oral   Resp: 20  18   Height:      Weight:   109.907 kg (242 lb 4.8 oz)   SpO2: 100%  99% 99%   Weight change:     Weight per 24 hour  Starting weight  111.131 kg   Current weight 109.4 kg   Net  -1.731 kg     Intake/Output Summary (Last 24 hours) at 03/12/14 1206 Last data filed at 03/12/14 0500  Gross per 24 hour  Intake      3 ml  Output    900 ml  Net   -897 ml   Physical Exam:  Appearance: morbidly obese woman, NAD HEENT: AT/[place], PERRL, EOMI Heart: RRR, normal S1S2  Lungs: upper lung fields clear to ausculation bilaterally   Abdomen: obese abdomen, difficult to assess BS due to patients body habitus, BS diminished   GU: foley in place  Extremities: right BKA and left AKA, well-healed. Right arm atrophied in comparison to left arm and has tremor in R.  Neurologic: somnolent but A&Ox3, strength 4/5 RUE and 1/5 LUE, CN II-XI intact Hypotonia in left arm residual deficit from stroke  Skin: unable to turn patient without nursing help  Lab Results: Recent Results (from the past 2160 hour(s))  CBC WITH DIFFERENTIAL      Status: Abnormal   Collection Time    03/11/14 11:45 AM      Result Value Ref Range   WBC 5.4  4.0 - 10.5 K/uL   RBC 3.55 (*) 3.87 - 5.11 MIL/uL   Hemoglobin 10.1 (*) 12.0 - 15.0 g/dL   Comment: REPEATED TO VERIFY   HCT 32.3 (*) 36.0 - 46.0 %   MCV 91.0  78.0 - 100.0 fL   MCH 28.5  26.0 - 34.0 pg   MCHC 31.3  30.0 - 36.0 g/dL   RDW 19.0 (*) 11.5 - 15.5 %   Platelets 98 (*) 150 - 400 K/uL   Comment: SPECIMEN CHECKED FOR CLOTS     REPEATED TO VERIFY     PLATELET COUNT CONFIRMED BY SMEAR   Neutrophils Relative % 64  43 - 77 %   Neutro Abs 3.4  1.7 - 7.7 K/uL   Lymphocytes Relative 19  12 - 46 %   Lymphs Abs 1.0  0.7 - 4.0 K/uL   Monocytes Relative 12  3 - 12 %   Monocytes Absolute 0.7  0.1 - 1.0 K/uL   Eosinophils Relative 5  0 - 5 %   Eosinophils Absolute  0.2  0.0 - 0.7 K/uL   Basophils Relative 0  0 - 1 %   Basophils Absolute 0.0  0.0 - 0.1 K/uL  COMPREHENSIVE METABOLIC PANEL     Status: Abnormal   Collection Time    03/11/14 11:45 AM      Result Value Ref Range   Sodium 140  137 - 147 mEq/L   Potassium 3.5 (*) 3.7 - 5.3 mEq/L   Chloride 102  96 - 112 mEq/L   CO2 27  19 - 32 mEq/L   Glucose, Bld 88  70 - 99 mg/dL   BUN 14  6 - 23 mg/dL   Creatinine, Ser 1.34 (*) 0.50 - 1.10 mg/dL   Calcium 9.7  8.4 - 10.5 mg/dL   Total Protein 7.6  6.0 - 8.3 g/dL   Albumin 2.9 (*) 3.5 - 5.2 g/dL   AST 19  0 - 37 U/L   ALT 6  0 - 35 U/L   Alkaline Phosphatase 107  39 - 117 U/L   Total Bilirubin 1.1  0.3 - 1.2 mg/dL   GFR calc non Af Amer 45 (*) >90 mL/min   GFR calc Af Amer 52 (*) >90 mL/min   Comment: (NOTE)     The eGFR has been calculated using the CKD EPI equation.     This calculation has not been validated in all clinical situations.     eGFR's persistently <90 mL/min signify possible Chronic Kidney     Disease.   Anion gap 11  5 - 15  PRO B NATRIURETIC PEPTIDE     Status: Abnormal   Collection Time    03/11/14 11:45 AM      Result Value Ref Range   Pro B Natriuretic  peptide (BNP) 52920.0 (*) 0 - 125 pg/mL  PROTIME-INR     Status: Abnormal   Collection Time    03/11/14 11:45 AM      Result Value Ref Range   Prothrombin Time 16.2 (*) 11.6 - 15.2 seconds   INR 1.30  0.00 - 1.49  I-STAT TROPOININ, ED     Status: None   Collection Time    03/11/14 12:00 PM      Result Value Ref Range   Troponin i, poc 0.01  0.00 - 0.08 ng/mL   Comment 3            Comment: Due to the release kinetics of cTnI,     a negative result within the first hours     of the onset of symptoms does not rule out     myocardial infarction with certainty.     If myocardial infarction is still suspected,     repeat the test at appropriate intervals.  URINE CULTURE     Status: None   Collection Time    03/11/14 12:00 PM      Result Value Ref Range   Specimen Description URINE, CATHETERIZED     Special Requests NONE     Culture  Setup Time       Value: 03/11/2014 12:42     Performed at [facility]       Value: >=100,000 COLONIES/ML     Performed at [facility]   Culture       Value: Multiple bacterial morphotypes present, none predominant. Suggest appropriate recollection if clinically indicated.     Performed at [facility]   Report Status 03/12/2014 FINAL    URINALYSIS, ROUTINE  W REFLEX MICROSCOPIC     Status: Abnormal   Collection Time    03/11/14 12:00 PM      Result Value Ref Range   Color, Urine YELLOW  YELLOW   APPearance CLOUDY (*) CLEAR   Specific Gravity, Urine 1.008  1.005 - 1.030   pH 6.0  5.0 - 8.0   Glucose, UA NEGATIVE  NEGATIVE mg/dL   Hgb urine dipstick TRACE (*) NEGATIVE   Bilirubin Urine NEGATIVE  NEGATIVE   Ketones, ur 15 (*) NEGATIVE mg/dL   Protein, ur 100 (*) NEGATIVE mg/dL   Urobilinogen, UA 0.2  0.0 - 1.0 mg/dL   Nitrite NEGATIVE  NEGATIVE   Leukocytes, UA MODERATE (*) NEGATIVE  URINE MICROSCOPIC-ADD ON     Status: Abnormal   Collection Time    03/11/14 12:00 PM      Result Value Ref Range    Squamous Epithelial / LPF RARE  RARE   WBC, UA 7-10  <3 WBC/hpf   RBC / HPF 0-2  <3 RBC/hpf   Bacteria, UA MANY (*) RARE  I-STAT CHEM 8, ED     Status: Abnormal   Collection Time    03/11/14 12:02 PM      Result Value Ref Range   Sodium 137  137 - 147 mEq/L   Potassium 3.2 (*) 3.7 - 5.3 mEq/L   Chloride 103  96 - 112 mEq/L   BUN 13  6 - 23 mg/dL   Creatinine, Ser 1.40 (*) 0.50 - 1.10 mg/dL   Glucose, Bld 91  70 - 99 mg/dL   Calcium, Ion 1.25 (*) 1.12 - 1.23 mmol/L   TCO2 26  0 - 100 mmol/L   Hemoglobin 12.6  12.0 - 15.0 g/dL   HCT 37.0  36.0 - 46.0 %  GLUCOSE, CAPILLARY     Status: None   Collection Time    03/11/14  9:12 PM      Result Value Ref Range   Glucose-Capillary 81  70 - 99 mg/dL  TROPONIN I     Status: None   Collection Time    03/11/14  9:33 PM      Result Value Ref Range   Troponin I <0.30  <0.30 ng/mL   Comment:            Due to the release kinetics of cTnI,     a negative result within the first hours     of the onset of symptoms does not rule out     myocardial infarction with certainty.     If myocardial infarction is still suspected,     repeat the test at appropriate intervals.  TROPONIN I     Status: None   Collection Time    03/12/14  4:51 AM      Result Value Ref Range   Troponin I <0.30  <0.30 ng/mL   Comment:            Due to the release kinetics of cTnI,     a negative result within the first hours     of the onset of symptoms does not rule out     myocardial infarction with certainty.     If myocardial infarction is still suspected,     repeat the test at appropriate intervals.  PROTIME-INR     Status: Abnormal   Collection Time    03/12/14  5:00 AM      Result Value Ref Range   Prothrombin Time 17.3 (*) 11.6 -  15.2 seconds   INR 1.41  0.00 - 1.49  COMPREHENSIVE METABOLIC PANEL     Status: Abnormal   Collection Time    03/12/14  5:00 AM      Result Value Ref Range   Sodium 138  137 - 147 mEq/L   Potassium 3.5 (*) 3.7 - 5.3 mEq/L    Chloride 100  96 - 112 mEq/L   CO2 28  19 - 32 mEq/L   Glucose, Bld 73  70 - 99 mg/dL   BUN 16  6 - 23 mg/dL   Creatinine, Ser 1.62 (*) 0.50 - 1.10 mg/dL   Calcium 9.6  8.4 - 10.5 mg/dL   Total Protein 6.9  6.0 - 8.3 g/dL   Albumin 2.7 (*) 3.5 - 5.2 g/dL   AST 13  0 - 37 U/L   ALT 6  0 - 35 U/L   Alkaline Phosphatase 105  39 - 117 U/L   Total Bilirubin 0.9  0.3 - 1.2 mg/dL   GFR calc non Af Amer 36 (*) >90 mL/min   GFR calc Af Amer 41 (*) >90 mL/min   Comment: (NOTE)     The eGFR has been calculated using the CKD EPI equation.     This calculation has not been validated in all clinical situations.     eGFR's persistently <90 mL/min signify possible Chronic Kidney     Disease.   Anion gap 10  5 - 15  CBC     Status: Abnormal   Collection Time    03/12/14  5:00 AM      Result Value Ref Range   WBC 4.3  4.0 - 10.5 K/uL   RBC 3.39 (*) 3.87 - 5.11 MIL/uL   Hemoglobin 9.6 (*) 12.0 - 15.0 g/dL   Comment: REPEATED TO VERIFY     SPECIMEN CHECKED FOR CLOTS   HCT 31.4 (*) 36.0 - 46.0 %   MCV 92.6  78.0 - 100.0 fL   MCH 28.3  26.0 - 34.0 pg   MCHC 30.6  30.0 - 36.0 g/dL   RDW 19.4 (*) 11.5 - 15.5 %   Platelets 120 (*) 150 - 400 K/uL  LIPID PANEL     Status: Abnormal   Collection Time    03/12/14  5:00 AM      Result Value Ref Range   Cholesterol 82  0 - 200 mg/dL   Triglycerides 77  <150 mg/dL   HDL 33 (*) >39 mg/dL   Total CHOL/HDL Ratio 2.5     VLDL 15  0 - 40 mg/dL   LDL Cholesterol 34  0 - 99 mg/dL   Comment:            Total Cholesterol/HDL:CHD Risk     Coronary Heart Disease Risk Table                         Men   Women      1/2 Average Risk   3.4   3.3      Average Risk       5.0   4.4      2 X Average Risk   9.6   7.1      3 X Average Risk  23.4   11.0                Use the calculated Patient Ratio     above and the CHD Risk Table  to determine the patient's CHD Risk.                ATP III CLASSIFICATION (LDL):      <100     mg/dL   Optimal      100-129   mg/dL   Near or Above                        Optimal      130-159  mg/dL   Borderline      160-189  mg/dL   High      >190     mg/dL   Very High  GLUCOSE, CAPILLARY     Status: None   Collection Time    03/12/14  7:37 AM      Result Value Ref Range   Glucose-Capillary 77  70 - 99 mg/dL  CLOSTRIDIUM DIFFICILE BY PCR     Status: Abnormal   Collection Time    03/12/14 11:35 AM      Result Value Ref Range   C difficile by pcr POSITIVE (*) NEGATIVE   Comment: CRITICAL RESULT CALLED TO, READ BACK BY AND VERIFIED WITH:     ESharyn Lull RN 12:50 03/12/14 ([email or web address])  GLUCOSE, CAPILLARY     Status: Abnormal   Collection Time    03/12/14 11:48 AM      Result Value Ref Range   Glucose-Capillary 119 (*) 70 - 99 mg/dL   Micro Results: Recent Results (from the past 240 hour(s))  URINE CULTURE     Status: None   Collection Time    03/11/14 12:00 PM      Result Value Ref Range Status   Specimen Description URINE, CATHETERIZED   Final   Special Requests NONE   Final   Culture  Setup Time     Final   Value: 03/11/2014 12:42     Performed at [place]     Final   Value: >=100,000 COLONIES/ML     Performed at [facility]   Culture     Final   Value: Multiple bacterial morphotypes present, none predominant. Suggest appropriate recollection if clinically indicated.     Performed at [facility]   Report Status 03/12/2014 FINAL   Final   Studies/Results: Dg Chest Portable 1 View  03/11/2014   CLINICAL DATA:  Left-sided chest pain and difficulty breathing; hypertension  EXAM: PORTABLE CHEST - 1 VIEW  COMPARISON:  February 23, 2014  FINDINGS: There is generalized cardiomegaly with pulmonary venous hypertension. There is extensive edema diffusely. Central catheter tip is at the cavoatrial junction. No pneumothorax. No adenopathy apparent.  IMPRESSION: Congestive heart failure, progressed from prior study. No pneumothorax.   Electronically Signed   By: Lowella Grip M.D.   On: 03/11/2014 11:28   Medications: I have reviewed the patient's current medications. Scheduled Meds: . allopurinol  100 mg Oral Daily  . atorvastatin  20 mg Oral QHS  . carvedilol  3.125 mg Oral BID WC  . ferrous sulfate  325 mg Oral BID  . furosemide  40 mg Intravenous Once  . furosemide  80 mg Intravenous Q8H  . hydrALAZINE  10 mg Oral TID  . insulin aspart  0-5 Units Subcutaneous QHS  . insulin aspart  0-9 Units Subcutaneous TID WC  . isosorbide dinitrate  20 mg Oral TID  . levETIRAcetam  500 mg Oral BID  . methylphenidate  5 mg Oral  Daily  . miconazole   Topical BID  . multivitamin with minerals  1 tablet Oral Daily  . pantoprazole  40 mg Oral Daily  . polyethylene glycol  17 g Oral Daily  . pregabalin  75 mg Oral BID  . sertraline  150 mg Oral Daily  . sodium chloride  3 mL Intravenous Q12H  . spironolactone  12.5 mg Oral Daily   Continuous Infusions:  PRN Meds:.LORazepam, traMADol Assessment/Plan: Principal Problem:   Chest pain of uncertain etiology Active Problems:   Type 2 diabetes mellitus with complication   Anxiety state   GERD   Morbid obesity   Essential hypertension   End-stage renal disease on hemodialysis   History of CVA (cerebrovascular accident)   History of seizure   Acute on chronic systolic heart failure  Ms. Skilton is a 53 year old F with multiple co-morbidities with chest pain and new onset of diarrheal symptoms.   Acute exacerbation of CHF:   GERD: Patient complained of chest pain likely due to GERD, also has a PMH of this and was not being treated  - Continue Protonix    DMII: Most recent A1c 5.3%, Pt has BKA  And AKA and peripheral neuropathy.  - Continue home Lyrica  - Continue ISS   Diarrhea: Patient complained of new onset of diarrhea this morning.  Patient was treated with abx for UTI at [facility] approximately two weeks ago.  C Diff PCR collected this AM was positive for C.Diff  -  Vancomycin $RemoveBef'50mg'DVFbsilKQq$ /mL  oral solution 125 mg four times daily    ESRD: Patient has been receiving HD on Tuesday/Thursday/Saturday schedule. Nephrology was consulted to determine best course of action. Due to her relatively good renal function patient will likely see more benefit from aggressive diuresis and HD can be discontinued at this time. - Stop HD for now  - Start Lasix $RemoveBef'80mg'[id number]$  IV every 8 hours  -  Essential Hypertension:  - continue home carvedilol, hydralazine, spironolactone, torsemide  History of Multiple CVAs: Bilateral MCA infarcts, left frontoparietal stroke, and multiple lacunar infarcts  - Pt is on coumadin, insulin and atorvastatin for prevention   Atrial Fibrillation: Documented on [facility] records, pts EKG did not indicate current A Fib  - Continue home coumadin for DVT/PE prophylaxis   Morbid Obesity: Bed bound at SNF  - OT to evaluate and treat patient  Chronic Low back pain: Patient has a history of DJD and is bed bound.  Noted to have area of thin skin above sacrum - covered area with dressing to prevent ulceration  - consult wound care  - Continue Lyrica and tramadol   History of Seizure: - continue home keppra  Sleep Apnea: - Continue home bipap at night   Depression and Anxiety: - Continue home ativan and sertraline   This is a Careers information officer Note.  The care of the patient was discussed with Dr. Megan Salon and Dr. Redmond Pulling and the assessment and plan formulated with their assistance.  Please see their attached note for official documentation of the daily encounter.   LOS: 1 day   Linton Ham, Med Student 03/12/2014, 12:06 PM

## 2014-03-12 NOTE — Progress Notes (Signed)
ANTICOAGULATION CONSULT NOTE - Follow Up Consult  Pharmacy Consult for Coumadin Indication: h/o CVA   Allergies  Allergen Reactions  . Ace Inhibitors Shortness Of Breath  . Latex Hives  . Lisinopril Other (See Comments)    Per MAR  . Vancomycin Other (See Comments)    Per [facility]    Patient Measurements: Height: 5' (152.4 cm) Weight: 242 lb 4.8 oz (109.907 kg) IBW/kg (Calculated) : 45.5 Heparin Dosing Weight:    Vital Signs: Temp: 98.6 F (37 C) (10/02 0500) Temp Source: Oral (10/02 0500) BP: 114/45 mmHg (10/02 1024) Pulse Rate: 92 (10/02 1024)  Labs:  Recent Labs  03/11/14 1145 03/11/14 1202 03/11/14 2133 03/12/14 0451 03/12/14 0500  HGB 10.1* 12.6  --   --  9.6*  HCT 32.3* 37.0  --   --  31.4*  PLT 98*  --   --   --  120*  LABPROT 16.2*  --   --   --  17.3*  INR 1.30  --   --   --  1.41  CREATININE 1.34* 1.40*  --   --  1.62*  TROPONINI  --   --  <0.30 <0.30  --     Estimated Creatinine Clearance: 46.2 ml/min (by C-G formula based on Cr of 1.62).    Assessment: CP at HD  Anticoagulation: h/o CVA (and DVT noted by MD). Coumadin PTA 6mg  daily with admit INR 1.41. Hgb only 9.6. Plts 120.  Cardiovascular: CHF with EF 20%, HTN, HLD. Meds: Lipitor, Coreg, IV Lasix, hydralazine, isordil, spironolacton  Endocrinology: Gout on allopurinol. DM on SSI  Gastrointestinal / Nutrition: morbid obesity, GERD on MV, po PPI  Neurology: Lower back pain, peripheral neuropathy, DJD, depression/anxiety. LDL only 34. Cognitive slowing and left-sided weakness. Keppra, Ritalin, Lyrica, Zoloft  Nephrology: new ESRD (1st HD 9/22), chronic indwelling foley.  Pulmonary: OSA  Hematology / Oncology: Anemia. Hgb 9.6 on po iron.  Goal of Therapy:  INR 2-3 Monitor platelets by anticoagulation protocol: Yes   Plan:  Coumadin 10mg  po x 1 tonight. Daily INR  Ariz Terrones S. Alford Highland, PharmD, [place] Clinical Staff Pharmacist Pager [phone]  Eilene Ghazi  [place] 03/12/2014,11:57 AM

## 2014-03-12 NOTE — Consult Note (Signed)
Referring Provider: No ref. provider found Primary Care Physician:  Rogene Houston, MD Primary Nephrologist:  [facility]  Reason for Consultation:  Assessment of patients admitted with chest pain and receiving dialysis treatments  HPI:  This is a pleasant lady with dialysis dependent renal failure was receiving dialysis yesterday and developed chest pain.  At the time of the pain, her EKG was normal and her vitals were stable.  CHF (LVEF 20%), right MCA CVA, left frontoparietal CVA, DMII, HTN. She has been in and out of hospitals over the past several months for CHF exacerbations.    Past Medical History  Diagnosis Date  . HTN (hypertension)   . HLD (hyperlipidemia)   . History of methicillin resistant staphylococcus aureus (MRSA)   . CVA (cerebral infarction)   . Anemia   . Tachycardia   . Obesity   . Lower back pain     Chronic  . Idiopathic peripheral neuropathy     NOS  . DJD (degenerative joint disease)   . Headache(784.0)   . GERD (gastroesophageal reflux disease)   . Depression   . Anxiety   . Hypokalemia     resolved  . Cerebral infarct     left parietal and bilateral  . Esophagitis     distal  . Fluid overload     compensated  . Urinary retention   . H/O: pneumonia   . History of bacteremia   . Surgery, other elective     of the ear  . Stroke     Hx of left frontoparietal stroke in 12/11. History of right brain stroke.  . Cardiomyopathy     Echocardiogram 04/23/11: Mild LVH, EF 30-35%, mild MR, mild LAE, mild-moderate RVE.  TEE 04/25/11: Moderate LVH, severe biventricular dysfunction, EF 20-25%, moderate to severe MR, moderate TR, biatrial enlargement, no PFO, negative bubble study  . Shortness of breath     with exertion   . Sleep apnea     no machine used  pt does not remember test for sleep apnea  . DM type 2 (diabetes mellitus, type 2)     type two   oral meds only   . 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 UTI (lower urinary tract  infection) 10/19/11    had approx April 2013  . Osteomyelitis   . DJD (degenerative joint disease)   . Urinary retention   . Pneumonia   . Peripheral vascular disease     Past Surgical History  Procedure Laterality Date  . Tympanoplasty    . Toe amputation      left 2nd toe  . Dilation and curettage of uterus      history  . Tee without cardioversion  04/25/2011    Procedure: TRANSESOPHAGEAL ECHOCARDIOGRAM (TEE);  Surgeon: Jolaine Artist, MD;  Location: [facility] ENDOSCOPY;  Service: Cardiovascular;  Laterality: N/A;  . Esophagogastroduodenoscopy  08/14/2011    Procedure: ESOPHAGOGASTRODUODENOSCOPY (EGD);  Surgeon: Scarlette Shorts, MD;  Location: [facility] ENDOSCOPY;  Service: Endoscopy;  Laterality: N/A;  . Amputation  01/18/2012    Procedure: AMPUTATION BELOW KNEE;  Surgeon: Newt Minion, MD;  Location: [place];  Service: Orthopedics;  Laterality: Left;  Left Below Knee Amputation  . Amputation Right 08/01/2012    Procedure: Right Below Knee Amputation;  Surgeon: Newt Minion, MD;  Location: [place];  Service: Orthopedics;  Laterality: Right;  Right Below Knee Amputation  . Pars plana vitrectomy Left 01/28/2013  Procedure: PARS PLANA VITRECTOMY WITH 23 GAUGE;  Surgeon: Adonis Brook, MD;  Location: [place];  Service: Ophthalmology;  Laterality: Left;  . Photocoagulation with laser Left 01/28/2013    Procedure: PHOTOCOAGULATION WITH LASER;  Surgeon: Adonis Brook, MD;  Location: [place];  Service: Ophthalmology;  Laterality: Left;  ENDOLASER  . Membrane peel Left 01/28/2013    Procedure: MEMBRANE PEEL;  Surgeon: Adonis Brook, MD;  Location: [place];  Service: Ophthalmology;  Laterality: Left;  . Amputation Left 03/18/2013    Procedure: AMPUTATION ABOVE KNEE;  Surgeon: Newt Minion, MD;  Location: [place];  Service: Orthopedics;  Laterality: Left;    Prior to Admission medications   Medication Sig Start Date End Date Taking? Authorizing Provider  allopurinol (ZYLOPRIM) 100 MG tablet Take 100 mg by mouth daily.    Yes Historical Provider, MD  atorvastatin (LIPITOR) 20 MG tablet Take 20 mg by mouth at bedtime.   Yes Historical Provider, MD  Calcium Carbonate-Vitamin D (CALCIUM + D PO) Take 1 tablet by mouth 2 (two) times daily.   Yes Historical Provider, MD  carvedilol (COREG) 3.125 MG tablet Take 3.125 mg by mouth 2 (two) times daily with a meal.   Yes Historical Provider, MD  ferrous sulfate 325 (65 FE) MG tablet Take 325 mg by mouth 2 (two) times daily.    Yes Historical Provider, MD  hydrALAZINE (APRESOLINE) 10 MG tablet Take 10 mg by mouth 3 (three) times daily.   Yes Historical Provider, MD  isosorbide dinitrate (ISORDIL) 20 MG tablet Take 20 mg by mouth 3 (three) times daily.   Yes Historical Provider, MD  isosorbide mononitrate (ISMO,MONOKET) 20 MG tablet Take by mouth 2 (two) times daily at 10 AM and 5 PM.   Yes Historical Provider, MD  levETIRAcetam (KEPPRA) 500 MG tablet Take 500 mg by mouth 2 (two) times daily.   Yes Historical Provider, MD  LORazepam (ATIVAN) 0.5 MG tablet Take 0.5 mg by mouth every 4 (four) hours as needed for anxiety.   Yes Historical Provider, MD  MICONAZOLE NITRATE EX Apply 1 application topically 2 (two) times daily.   Yes Historical Provider, MD  Multiple Vitamin (MULTIVITAMIN WITH MINERALS) TABS tablet Take 1 tablet by mouth daily.    Yes Historical Provider, MD  polyethylene glycol (MIRALAX / GLYCOLAX) packet Take 17 g by mouth daily.   Yes Historical Provider, MD  pregabalin (LYRICA) 75 MG capsule Take 1 capsule (75 mg total) by mouth 2 (two) times daily. 08/14/13  Yes Adam Melvern Sample, DO  sertraline (ZOLOFT) 50 MG tablet Take 150 mg by mouth daily.   Yes Historical Provider, MD  spironolactone (ALDACTONE) 12.5 mg TABS tablet Take 12.5 mg by mouth daily.   Yes Historical Provider, MD  tiZANidine (ZANAFLEX) 4 MG tablet Take 4 mg by mouth every 8 (eight) hours as needed for muscle spasms.   Yes Historical Provider, MD  torsemide (DEMADEX) 20 MG tablet Take 20 mg by mouth  daily.   Yes Historical Provider, MD  traMADol (ULTRAM) 50 MG tablet Take 50 mg by mouth every 8 (eight) hours as needed (for pain).   Yes Historical Provider, MD  traZODone (DESYREL) 50 MG tablet Take 50 mg by mouth at bedtime.   Yes Historical Provider, MD  insulin aspart (NOVOLOG) 100 UNIT/ML injection Inject 5 Units into the skin 4 (four) times daily -  before meals and at bedtime. 5 units for blood sugar>150    Historical Provider, MD  methylphenidate (RITALIN) 5 MG tablet  Take 5 mg by mouth daily.    Historical Provider, MD  metoprolol (LOPRESSOR) 100 MG tablet Take 100 mg by mouth 2 (two) times daily.    Historical Provider, MD  warfarin (COUMADIN) 6 MG tablet Take 6 mg by mouth at bedtime.    Historical Provider, MD    Current Facility-Administered Medications  Medication Dose Route Frequency Provider Last Rate Last Dose  . allopurinol (ZYLOPRIM) tablet 100 mg  100 mg Oral Daily Francesca Oman, DO   100 mg at 03/12/14 1027  . atorvastatin (LIPITOR) tablet 20 mg  20 mg Oral QHS Francesca Oman, DO   20 mg at 03/11/14 2116  . carvedilol (COREG) tablet 3.125 mg  3.125 mg Oral BID WC Francesca Oman, DO   3.125 mg at 03/12/14 [id number]  . ferrous sulfate tablet 325 mg  325 mg Oral BID Francesca Oman, DO   325 mg at 03/12/14 1027  . hydrALAZINE (APRESOLINE) tablet 10 mg  10 mg Oral TID Francesca Oman, DO   10 mg at 03/12/14 1027  . insulin aspart (novoLOG) injection 0-5 Units  0-5 Units Subcutaneous QHS Francesca Oman, DO      . insulin aspart (novoLOG) injection 0-9 Units  0-9 Units Subcutaneous TID WC Francesca Oman, DO      . isosorbide dinitrate (ISORDIL) tablet 20 mg  20 mg Oral TID Francesca Oman, DO   20 mg at 03/12/14 1027  . levETIRAcetam (KEPPRA) tablet 500 mg  500 mg Oral BID Francesca Oman, DO   500 mg at 03/12/14 1027  . LORazepam (ATIVAN) tablet 0.5 mg  0.5 mg Oral Q4H PRN Francesca Oman, DO   0.5 mg at 03/12/14 0932  . methylphenidate (RITALIN) tablet 5 mg  5 mg Oral Daily Francesca Oman, DO   5  mg at 03/12/14 1027  . miconazole (MICOTIN) 2 % cream   Topical BID Francesca Oman, DO      . multivitamin with minerals tablet 1 tablet  1 tablet Oral Daily Francesca Oman, DO   1 tablet at 03/12/14 1025  . pantoprazole (PROTONIX) EC tablet 40 mg  40 mg Oral Daily Drucilla Schmidt, MD   40 mg at 03/12/14 1027  . polyethylene glycol (MIRALAX / GLYCOLAX) packet 17 g  17 g Oral Daily Francesca Oman, DO   17 g at 03/11/14 2110  . pregabalin (LYRICA) capsule 75 mg  75 mg Oral BID Francesca Oman, DO   75 mg at 03/12/14 1027  . sertraline (ZOLOFT) tablet 150 mg  150 mg Oral Daily Francesca Oman, DO   150 mg at 03/11/14 2109  . sodium chloride 0.9 % injection 3 mL  3 mL Intravenous Q12H Francesca Oman, DO   3 mL at 03/11/14 2119  . spironolactone (ALDACTONE) tablet 12.5 mg  12.5 mg Oral Daily Francesca Oman, DO   12.5 mg at 03/12/14 1027  . torsemide (DEMADEX) tablet 20 mg  20 mg Oral Daily Francesca Oman, DO   20 mg at 03/12/14 1025  . traMADol (ULTRAM) tablet 50 mg  50 mg Oral Q8H PRN Francesca Oman, DO   50 mg at 03/12/14 0932    Allergies as of 03/11/2014 - Review Complete 03/11/2014  Allergen Reaction Noted  . Ace inhibitors Shortness Of Breath 04/26/2011  . Latex Hives 10/19/2011  . Lisinopril Other (See Comments) 09/13/2011  . Vancomycin Other (See Comments) 10/10/2010  Family History  Problem Relation Age of Onset  . Coronary artery disease    . Diabetes    . Cancer Mother   . Diabetes Mother   . Heart disease Father   . Hyperlipidemia Father   . Hypertension Father   . Stroke Father   . Heart disease Brother   . Anesthesia problems Neg Hx   . Hypotension Neg Hx   . Malignant hyperthermia Neg Hx   . Pseudochol deficiency Neg Hx     History   Social History  . Marital Status: Divorced    Spouse Name: N/A    Number of Children: N/A  . Years of Education: N/A   Occupational History  . Not on file.   Social History Main Topics  . Smoking status: Never Smoker   . Smokeless  tobacco: Never Used  . Alcohol Use: Yes     Comment: rare on holidays  . Drug Use: No  . Sexual Activity: Not Currently   Other Topics Concern  . Not on file   Social History Narrative   Divorced   One of 10 children   5 in [place] and was living with sister at time of CVA   Nonsmoker   Nondrinker    Review of Systems: Gen: Denies any fever, chills, sweats, anorexia, fatigue, weakness, malaise, weight loss, and sleep disorder HEENT: No visual complaints, No history of Retinopathy. Normal external appearance No Epistaxis or Sore throat. No sinusitis.   CV: Denies chest pain, angina, palpitations, syncope, orthopnea, PND, peripheral edema, and claudication. Resp: Denies dyspnea at rest,uses 2-3L O2 [place] at home with bipap at night GI: Denies vomiting blood, jaundice, and fecal incontinence.   Denies dysphagia or odynophagia. GU : Denies urinary burning, blood in urine, urinary frequency, urinary hesitancy, nocturnal urination, and urinary incontinence.  No renal calculi. MS: Denies joint pain, limitation of movement, and swelling, stiffness, low back pain, extremity pain. Denies muscle weakness, cramps, atrophy.  No use of non steroidal antiinflammatory drugs. Derm: Denies rash, itching, dry skin, hives, moles, warts, or unhealing ulcers.  Psych: Denies depression, anxiety, memory loss, suicidal ideation, hallucinations, paranoia, and confusion. Heme: Denies bruising, bleeding, and enlarged lymph nodes. Neuro: No headache.  No diplopia. No dysarthria.  No dysphasia.  No history of CVA.  No Seizures. No paresthesias.  No weakness. Endocrine No DM.  No Thyroid disease.  No Adrenal disease.  Physical Exam: Vital signs in last 24 hours: Temp:  [98 F (36.7 C)-98.7 F (37.1 C)] 98.6 F (37 C) (10/02 0500) Pulse Rate:  [81-92] 92 (10/02 1024) Resp:  [16-23] 18 (10/02 0500) BP: (114-208)/(45-129) 114/45 mmHg (10/02 1024) SpO2:  [92 %-100 %] 99 % (10/02 1024) Weight:  [109.4 kg (241 lb 2.9  oz)-109.907 kg (242 lb 4.8 oz)] 109.907 kg (242 lb 4.8 oz) (10/02 0500) Last BM Date: 03/11/14 General:   Ill appearing lady non distressedmorbidly obese woman  Head:  Normocephalic and atraumatic. Eyes:  Sclera clear, no icterus.   Conjunctiva pink. Ears:  Normal auditory acuity. Nose:  No deformity, discharge,  or lesions. Mouth:  No deformity or lesions, dentition normal. Neck:  Supple; no masses or thyromegaly. JVP not elevated Lungs:  Scattered and diminished breath sounds Heart:  Regular rate and rhythm; no murmurs, clicks, rubs,  or gallops. Abdomen:  Soft, nontender and nondistended. No masses, hepatosplenomegaly or hernias noted. Normal bowel sounds, without guarding, and without rebound.  pannus covered by white cream  Msk:  right BKA and left  AKA, well-healed. Right arm atrophied in comparison to left arm  Pulses:  No carotid, renal, femoral bruits. DP and PT symmetrical and equal Extremities:  Without clubbing or edema. Neurologic:  Alert and  oriented x4;  grossly normal neurologically. Skin:  Intact without significant lesions or rashes. Cervical Nodes:  No significant cervical adenopathy. Psych:  Alert and cooperative. Normal mood and affect.  Intake/Output from previous day: 10/01 0701 - 10/02 0700 In: 3 [I.V.:3] Out: 900 [Urine:900] Intake/Output this shift:    Lab Results:  Recent Labs  03/11/14 1145 03/11/14 1202 03/12/14 0500  WBC 5.4  --  4.3  HGB 10.1* 12.6 9.6*  HCT 32.3* 37.0 31.4*  PLT 98*  --  120*   BMET  Recent Labs  03/11/14 1145 03/11/14 1202 03/12/14 0500  NA 140 137 138  K 3.5* 3.2* 3.5*  CL 102 103 100  CO2 27  --  28  GLUCOSE 88 91 73  BUN 14 13 16   CREATININE 1.34* 1.40* 1.62*  CALCIUM 9.7  --  9.6   LFT  Recent Labs  03/12/14 0500  PROT 6.9  ALBUMIN 2.7*  AST 13  ALT 6  ALKPHOS 105  BILITOT 0.9   PT/INR  Recent Labs  03/11/14 1145 03/12/14 0500  LABPROT 16.2* 17.3*  INR 1.30 1.41   Hepatitis Panel No  results found for this basename: HEPBSAG, HCVAB, HEPAIGM, HEPBIGM,  in the last 72 hours  Studies/Results: Dg Chest Portable 1 View  03/11/2014   CLINICAL DATA:  Left-sided chest pain and difficulty breathing; hypertension  EXAM: PORTABLE CHEST - 1 VIEW  COMPARISON:  February 23, 2014  FINDINGS: There is generalized cardiomegaly with pulmonary venous hypertension. There is extensive edema diffusely. Central catheter tip is at the cavoatrial junction. No pneumothorax. No adenopathy apparent.  IMPRESSION: Congestive heart failure, progressed from prior study. No pneumothorax.   Electronically Signed   By: Lowella Grip M.D.   On: 03/11/2014 11:28    Assessment/Plan: This is a very interesting lady and I am curious why she is on dialysis with such a relatively high GFR. She may respond well to intravenous diuretics.She certainly does have some symptoms and radiological findings of volume overload. I have discussed the case with Dr Megan Salon and will follow over the weekend  Anemia stable  Volume XS  Continue diuresis  LOS: 1 Jermie Hippe W @TODAY @11 :14 AM

## 2014-03-12 NOTE — Progress Notes (Signed)
CRITICAL VALUE ALERT  Critical value received:  Cdiff PCR Positive  Date of notification:  03/12/2014  Time of notification:  [id number]  Critical value read back:Yes.    Nurse who received alert:  Neta Ehlers, RN  MD notified (1st page):  FMTC via amion  Time of first page:  1330  MD notified (2nd page):  Time of second page:  Responding MD:  Dr. Sherrine Maples  Time MD responded:  1340

## 2014-03-12 NOTE — Evaluation (Signed)
Physical Therapy Evaluation/ Discharge Patient Details Name: [name] MRN: [id number] DOB: 1960/12/22 Today's Date: 03/12/2014   History of Present Illness  [name] is a 53 year old female with a pertinent past medical history of CHF(EF of 20%), ESRD on dialysis, right MCA stroke, left frontoparietal stroke,  type 2 diabetes mellitus, HTN who presented to the ED by EMS after experiencing stabbing and burning chest pain during dialysis . Pt with R BKA, L AKA and L hemiplegia  Clinical Impression  Pt pleasant with poor memory unable to recall name of facility and states 3 people transfer for her to ambulance for HD. Pt with contractures, obesity and 2-3 person assist for mobility at baseline. Pt at baseline physical function and not appropriate for further acute therapy. Will defer back to SNF and sign off, pt aware and agreeable.     Follow Up Recommendations SNF (return to SNF as pt total care at baseline)    Equipment Recommendations  None recommended by PT    Recommendations for Other Services       Precautions / Restrictions Precautions Precautions: Fall Precaution Comments: bil amputee, right knee contracture, left hand contracture      Mobility  Bed Mobility Overal bed mobility: Needs Assistance;+2 for physical assistance Bed Mobility: Rolling Rolling: +2 for physical assistance;Total assist         General bed mobility comments: 2 person assist with pad to roll and additional person to position pillows for pressure relief, 2 person total assist for scoot to [name]  Transfers                    Ambulation/Gait                Stairs            Wheelchair Mobility    Modified Rankin (Stroke Patients Only)       Balance                                             Pertinent Vitals/Pain Pain Assessment: 0-10 Pain Score: 3  Pain Location: Right hip Pain Descriptors / Indicators: Sore Pain Intervention(s):  Repositioned    Home Living Family/patient expects to be discharged to:: Skilled nursing facility                 Additional Comments: Pt states shes from a SNF in [place]    Prior Function Level of [place]: Needs assistance   Gait / Transfers Assistance Needed: pt states total assist of 2-3 people for transfer to ambulance for HD and total of 2 for all other mobility  ADL's / Homemaking Assistance Needed: total assist for all ADLs        Hand Dominance   Dominant Hand: Right    Extremity/Trunk Assessment   Upper Extremity Assessment: LUE deficits/detail;RUE deficits/detail RUE Deficits / Details: grossly 2/5 strength able to reach for P.T. hand with cueing and time     LUE Deficits / Details: flaccid with hand contracture, repositioned and propped arm, pt states she has a splint but not present   Lower Extremity Assessment: RLE deficits/detail;LLE deficits/detail RLE Deficits / Details: pt with 2-/5 hip flexion, knee flexion contracture, BKA and no other hip motion LLE Deficits / Details: AKA with flaccid extremity  Cervical / Trunk Assessment: Other exceptions  Communication   Communication:  No difficulties  Cognition Arousal/Alertness: Awake/alert Behavior During Therapy: Flat affect Overall Cognitive Status: No family/caregiver present to determine baseline cognitive functioning                      General Comments      Exercises        Assessment/Plan    PT Assessment Patent does not need any further PT services  PT Diagnosis Generalized weakness;Altered mental status   PT Problem List Decreased strength;Decreased cognition;Impaired tone;Decreased range of motion;Decreased activity tolerance;Decreased safety awareness;Obesity;Decreased mobility  PT Treatment Interventions     PT Goals (Current goals can be found in the Care Plan section) Acute Rehab PT Goals PT Goal Formulation: No goals set, d/c therapy    Frequency      Barriers to discharge        Co-evaluation               End of Session   Activity Tolerance: Patient tolerated treatment well Patient left: in bed;with call bell/phone within reach;with nursing/sitter in room Nurse Communication: Mobility status;Need for lift equipment         Time: 531 080 0286 PT Time Calculation (min): 13 min   Charges:   PT Evaluation $Initial PT Evaluation Tier I: 1 Procedure     PT G CodesMelford Murphy 03/12/2014, 9:08 AM [name] [place], [place]

## 2014-03-12 NOTE — Progress Notes (Signed)
Pt placed on Auto BIPAp 5-20 CMH20 with 2 LPM O2 bleed in via FFM.  Pt tolerating well at this time, RT to monitor and assess as needed.

## 2014-03-12 NOTE — Progress Notes (Signed)
        Attending progress note    Date of Admission:  03/11/2014     Principal Problem:   Chest pain of uncertain etiology Active Problems:   Type 2 diabetes mellitus with complication   Anxiety state   GERD   Morbid obesity   Essential hypertension   End-stage renal disease on hemodialysis   History of CVA (cerebrovascular accident)   History of seizure   Acute on chronic systolic heart failure   . allopurinol  100 mg Oral Daily  . atorvastatin  20 mg Oral QHS  . carvedilol  3.125 mg Oral BID WC  . ferrous sulfate  325 mg Oral BID  . furosemide  40 mg Intravenous Once  . furosemide  80 mg Intravenous Q8H  . hydrALAZINE  10 mg Oral TID  . insulin aspart  0-5 Units Subcutaneous QHS  . insulin aspart  0-9 Units Subcutaneous TID WC  . isosorbide dinitrate  20 mg Oral TID  . levETIRAcetam  500 mg Oral BID  . methylphenidate  5 mg Oral Daily  . miconazole   Topical BID  . multivitamin with minerals  1 tablet Oral Daily  . pantoprazole  40 mg Oral Daily  . polyethylene glycol  17 g Oral Daily  . pregabalin  75 mg Oral BID  . sertraline  150 mg Oral Daily  . sodium chloride  3 mL Intravenous Q12H  . spironolactone  12.5 mg Oral Daily  . warfarin  10 mg Oral ONCE-1800  . Warfarin - Pharmacist Dosing Inpatient   Does not apply q1800    I have seen and examined [name] with Dr. Redmond Pulling and medical student, Theressa Stamps. I've also discussed her case with Dr. Edrick Oh. It appears that she was recently started on urgent hemodialysis while hospitalized at [facility] with volume overload. She was admitted here yesterday with chest pain that developed while on hemodialysis. She relates recent fever although none has been documented. Chest x-ray reveals evidence of volume overload. She has started having diarrhea and is C. difficile PCR positive. Her creatinine and GFR are actually fairly good so we will attempt to manage her volume overload with diuresis and  see if she can avoid further hemodialysis. We will also treat her for her C. difficile colitis. We will need to obtain records from [facility] and review CODE STATUS and goals of care.  Michel Bickers, MD [facility] [phone] pager   [phone] cell 03/12/2014, 1:42 PM

## 2014-03-12 NOTE — Progress Notes (Signed)
Subjective: [name] feels "better" this morning. She does report that she passed a BM and needs to be cleaned. She is experiencing some shortness of breath. She is not experiencing chest pain.  Objective: Vital signs in last 24 hours: Filed Vitals:   03/11/14 2115 03/11/14 2117 03/12/14 0500 03/12/14 1024  BP: 150/48  119/62 114/45  Pulse: 82 82 84 92  Temp: 98 F (36.7 C)  98.6 F (37 C)   TempSrc:   Oral   Resp: 20  18   Height:      Weight:   242 lb 4.8 oz (109.907 kg)   SpO2: 100%  99% 99%   Weight change:   Intake/Output Summary (Last 24 hours) at 03/12/14 1442 Last data filed at 03/12/14 0500  Gross per 24 hour  Intake      3 ml  Output    900 ml  Net   -897 ml   Physical Exam: Appearance: morbidly obese woman lying in bed, looks much more alert today HEENT: AT/[place], PERRL, EOMi, no longer drooling Heart: RRR, normal S1S2  Lungs: diminished breath sounds throughout  Abdomen: obese abdomen, diminished abdominal sounds but BS present, nontender, pannus covered by white cream (difficult to visualize), but malodorus  GU: foley in place  Extremities: right BKA and left AKA, well-healed. Right arm atrophied in comparison to left arm, right arm has resting tremor Neurologic: somnolent but A&Ox3, strength 4/5 RUE and 1/5 LUE, CN II-XI intact  Skin: area of hyperpigmented, thin skin (bone palpable) just above sacrum with no actual skin breakdown; placed cushioned bandage at site  Lab Results: Basic Metabolic Panel:  Recent Labs Lab 03/11/14 1145 03/11/14 1202 03/12/14 0500  NA 140 137 138  K 3.5* 3.2* 3.5*  CL 102 103 100  CO2 27  --  28  GLUCOSE 88 91 73  BUN 14 13 16   CREATININE 1.34* 1.40* 1.62*  CALCIUM 9.7  --  9.6   Liver Function Tests:  Recent Labs Lab 03/11/14 1145 03/12/14 0500  AST 19 13  ALT 6 6  ALKPHOS 107 105  BILITOT 1.1 0.9  PROT 7.6 6.9  ALBUMIN 2.9* 2.7*   CBC:  Recent Labs Lab 03/11/14 1145 03/11/14 1202 03/12/14 0500    WBC 5.4  --  4.3  NEUTROABS 3.4  --   --   HGB 10.1* 12.6 9.6*  HCT 32.3* 37.0 31.4*  MCV 91.0  --  92.6  PLT 98*  --  120*   Cardiac Enzymes:  Recent Labs Lab 03/11/14 2133 03/12/14 0451  TROPONINI <0.30 <0.30   BNP:  Recent Labs Lab 03/11/14 1145  PROBNP 52920.0*   CBG:  Recent Labs Lab 03/11/14 2112 03/12/14 0737 03/12/14 1148  GLUCAP 81 77 119*   Fasting Lipid Panel:  Recent Labs Lab 03/12/14 0500  CHOL 82  HDL 33*  LDLCALC 34  TRIG 77  CHOLHDL 2.5   Coagulation:  Recent Labs Lab 03/11/14 1145 03/12/14 0500  LABPROT 16.2* 17.3*  INR 1.30 1.41   Urine Drug Screen: Drugs of Abuse     Component Value Date/Time   LABOPIA NONE DETECTED 04/23/2011 0714   COCAINSCRNUR NONE DETECTED 04/23/2011 0714   LABBENZ NONE DETECTED 04/23/2011 0714   AMPHETMU NONE DETECTED 04/23/2011 0714   THCU NONE DETECTED 04/23/2011 0714   LABBARB NONE DETECTED 04/23/2011 0714    Urinalysis:  Recent Labs Lab 03/11/14 1200  COLORURINE YELLOW  LABSPEC 1.008  PHURINE 6.0  GLUCOSEU NEGATIVE  HGBUR TRACE*  BILIRUBINUR NEGATIVE  KETONESUR 15*  PROTEINUR 100*  UROBILINOGEN 0.2  NITRITE NEGATIVE  LEUKOCYTESUR MODERATE*    Micro Results: Recent Results (from the past 240 hour(s))  URINE CULTURE     Status: None   Collection Time    03/11/14 12:00 PM      Result Value Ref Range Status   Specimen Description URINE, CATHETERIZED   Final   Special Requests NONE   Final   Culture  Setup Time     Final   Value: 03/11/2014 12:42     Performed at [place]     Final   Value: >=100,000 COLONIES/ML     Performed at [facility]   Culture     Final   Value: Multiple bacterial morphotypes present, none predominant. Suggest appropriate recollection if clinically indicated.     Performed at [facility]   Report Status 03/12/2014 FINAL   Final  CLOSTRIDIUM DIFFICILE BY PCR     Status: Abnormal   Collection Time    03/12/14  11:35 AM      Result Value Ref Range Status   C difficile by pcr POSITIVE (*) NEGATIVE Final   Comment: CRITICAL RESULT CALLED TO, READ BACK BY AND VERIFIED WITH:     Sheppard Evens RN 12:50 03/12/14 ([email or web address])   Studies/Results: Dg Chest Portable 1 View  03/11/2014   CLINICAL DATA:  Left-sided chest pain and difficulty breathing; hypertension  EXAM: PORTABLE CHEST - 1 VIEW  COMPARISON:  February 23, 2014  FINDINGS: There is generalized cardiomegaly with pulmonary venous hypertension. There is extensive edema diffusely. Central catheter tip is at the cavoatrial junction. No pneumothorax. No adenopathy apparent.  IMPRESSION: Congestive heart failure, progressed from prior study. No pneumothorax.   Electronically Signed   By: Lowella Grip M.D.   On: 03/11/2014 11:28   Medications: I have reviewed the patient's current medications. Scheduled Meds: . allopurinol  100 mg Oral Daily  . atorvastatin  20 mg Oral QHS  . carvedilol  3.125 mg Oral BID WC  . ferrous sulfate  325 mg Oral BID  . furosemide  80 mg Intravenous Q8H  . hydrALAZINE  10 mg Oral TID  . insulin aspart  0-5 Units Subcutaneous QHS  . insulin aspart  0-9 Units Subcutaneous TID WC  . isosorbide dinitrate  20 mg Oral TID  . levETIRAcetam  500 mg Oral BID  . methylphenidate  5 mg Oral Daily  . miconazole   Topical BID  . multivitamin with minerals  1 tablet Oral Daily  . pantoprazole  40 mg Oral Daily  . polyethylene glycol  17 g Oral Daily  . pregabalin  75 mg Oral BID  . sertraline  150 mg Oral Daily  . sodium chloride  3 mL Intravenous Q12H  . spironolactone  12.5 mg Oral Daily  . vancomycin  125 mg Oral QID  . warfarin  10 mg Oral ONCE-1800  . Warfarin - Pharmacist Dosing Inpatient   Does not apply q1800   Continuous Infusions:  PRN Meds:.LORazepam, traMADol Assessment/Plan: Principal Problem:   Chest pain of uncertain etiology Active Problems:   Type 2 diabetes mellitus with complication   Anxiety state   GERD    Morbid obesity   Essential hypertension   End-stage renal disease on hemodialysis   History of CVA (cerebrovascular accident)   History of seizure   Acute on chronic systolic heart failure  [name] is a 53 yo woman with  multiple co-morbidities who presented from dialysis with sharp, burning chest pain on 10/1. She has been found to have a negative i-stat troponin and unchanged EKG and her XR showed extensive edema diffusely and her proBNP was elevated to [address]; however, the proBNP is frequently elevated in patients with CKD and the range of post-dialysis pro-BNP varies widely with abnormal distribution. She has also been found to be infected with c difficile.  Chest Pain of Uncertain Etiology in the context of CHF: The patient described her pain on 10/1 as sharp and burning and 4/10 in intensity; it responded to aspirin and nitro (given by EMS) and her pain has not recurred. Troponins were negative x3 and her EKG was unchanged this morning. Lipid panel WNL (except LDL 34). The patient does also have a history of GERD. The patient's CXR is convincing for volume overload. - Initiate lasix 80 mg IV daily (she is on home torsemide 20 mg daily) for aggressive diuresis - Strict I's & O's - Continue nitro TID - Continue home protonix - Tele    Clostridium Difficile Infection: Patient was recently treated for a UTI during hospitalization at [facility]. When she complained of a BM this morning, it was cultured and was found to be + for c difficile. Given the patient's elevated creatinine and her multiple comorbidities, along with her bed-bound status, she is at high risk for complications from this infection. - Initiate treatment with vancomycin solution (125 mg four times per day); this should be continued for 10 to 14 days. This may be escalated to 500 mg at the same frequency if she does not show signs of improvement; metronidazole can also be added - Enteric precautions - CBC tomorrow am  ESRD:  On Tuesday, Thursday, Saturday dialysis through [facility]. First started on 9/22, received 4th treatment 10/1. With her relatively high GFR (52) and baseline creatinine 1.35, she may not need to be on dialysis. IV diuresis may be adequate to treat her volume overload.  - Appreciate renal consult - Has scheduled HD tomorrow, but may not need it - RFP ordered for am  Acute on Chronic Systolic Heart Failure: Most recent LVEF 20%.  - Continue home spironolactone, hydralazine and carvedilol   GERD: Has diagnosis of GERD, but does not take  - Started protonix; consider sending her home on this medication   DMII: Most recent A1c in our records is 5.3% (but has been as high as 13.9%) with peripheral neuropathy and complications that led to her BKA and AKA. On 5 novolog with meals at home.  - HgbA1c 5.3% - Continue home Lyrica  - Continue ISS (sensitive renal), sliding scale at bedtime  Essential Hypertension:  - Continue home carvedilol, hydralazine and spironolactone  History of Multiple CVAs: Cognitive slowing and left-sided weakness. Patient is on coumadin, insulin and atorvastatin.   Atrial Fibrillation: Documented in [facility].  - Continue home coumadin; patient also has a history of DVT   Morbid Obesity: bed bound at SNF  - Appreciate PT visit and OT attempted visit; recommend return to SNF  History of Seizure:  - Continue home keppra   Sleep Apnea:  - Continue home bipap at night   Depression and Anxiety:  - Continue home ativan and sertraline   DVT Ppx:  - Patient is on coumadin; confirming with SNF today  Diet:  - HH / Carb Mod   Dispo: Disposition is deferred at this time, awaiting improvement of current medical problems.  Anticipated discharge in approximately  4 day(s).   The patient does have a current PCP Rogene Houston, MD) and does not need an [facility] hospital follow-up appointment after discharge.  The patient does have transportation limitations that hinder  transportation to clinic appointments.  .Services Needed at time of discharge: Y = Yes, Blank = No PT:   OT:   RN:   Equipment:   Other:     LOS: 1 day   Drucilla Schmidt, MD 03/12/2014, 2:42 PM

## 2014-03-12 NOTE — Progress Notes (Signed)
OT Cancellation Note  Patient Details Name: [name] MRN: [id number] DOB: [birth year]   Cancelled Treatment:    Reason Eval/Treat Not Completed: OT screened, no needs identified, will sign off. Per PT report, pt is at baseline level of function and does not have any acute therapy needs. Defer to SNF. Will sign off.   03/12/2014 Darrol Jump OTR/L Pager [phone] Office [phone]

## 2014-03-12 NOTE — Progress Notes (Signed)
Family requesting air overlay mattress for patient.  Will ask MD to order in epic.

## 2014-03-13 ENCOUNTER — Inpatient Hospital Stay (HOSPITAL_COMMUNITY): Payer: Medicare Other

## 2014-03-13 DIAGNOSIS — I319 Disease of pericardium, unspecified: Secondary | ICD-10-CM

## 2014-03-13 LAB — PROTIME-INR
INR: 1.39 (ref 0.00–1.49)
Prothrombin Time: 17.1 seconds — ABNORMAL HIGH (ref 11.6–15.2)

## 2014-03-13 LAB — CBC
HEMATOCRIT: 29.2 % — AB (ref 36.0–46.0)
HEMOGLOBIN: 9.1 g/dL — AB (ref 12.0–15.0)
MCH: 28.2 pg (ref 26.0–34.0)
MCHC: 31.2 g/dL (ref 30.0–36.0)
MCV: 90.4 fL (ref 78.0–100.0)
Platelets: 153 10*3/uL (ref 150–400)
RBC: 3.23 MIL/uL — ABNORMAL LOW (ref 3.87–5.11)
RDW: 19.4 % — ABNORMAL HIGH (ref 11.5–15.5)
WBC: 4.9 10*3/uL (ref 4.0–10.5)

## 2014-03-13 LAB — RENAL FUNCTION PANEL
ANION GAP: 14 (ref 5–15)
Albumin: 2.8 g/dL — ABNORMAL LOW (ref 3.5–5.2)
BUN: 19 mg/dL (ref 6–23)
CO2: 23 meq/L (ref 19–32)
Calcium: 9.2 mg/dL (ref 8.4–10.5)
Chloride: 100 mEq/L (ref 96–112)
Creatinine, Ser: 1.86 mg/dL — ABNORMAL HIGH (ref 0.50–1.10)
GFR calc non Af Amer: 30 mL/min — ABNORMAL LOW (ref >90)
GFR, EST AFRICAN AMERICAN: 35 mL/min — AB (ref >90)
Glucose, Bld: 74 mg/dL (ref 70–99)
Phosphorus: 2.2 mg/dL — ABNORMAL LOW (ref 2.3–4.6)
Potassium: 3.9 mEq/L (ref 3.7–5.3)
SODIUM: 137 meq/L (ref 137–147)

## 2014-03-13 LAB — GLUCOSE, CAPILLARY
GLUCOSE-CAPILLARY: 100 mg/dL — AB (ref 70–99)
GLUCOSE-CAPILLARY: 100 mg/dL — AB (ref 70–99)
Glucose-Capillary: 72 mg/dL (ref 70–99)
Glucose-Capillary: 93 mg/dL (ref 70–99)

## 2014-03-13 MED ORDER — CHLORHEXIDINE GLUCONATE 0.12 % MT SOLN
15.0000 mL | Freq: Two times a day (BID) | OROMUCOSAL | Status: DC
  Administered 2014-03-14 – 2014-03-18 (×10): 15 mL via OROMUCOSAL
  Filled 2014-03-13 (×13): qty 15

## 2014-03-13 MED ORDER — CETYLPYRIDINIUM CHLORIDE 0.05 % MT LIQD
7.0000 mL | Freq: Two times a day (BID) | OROMUCOSAL | Status: DC
  Administered 2014-03-14 – 2014-03-19 (×7): 7 mL via OROMUCOSAL

## 2014-03-13 MED ORDER — WARFARIN SODIUM 10 MG PO TABS
10.0000 mg | ORAL_TABLET | Freq: Once | ORAL | Status: AC
  Administered 2014-03-13: 10 mg via ORAL
  Filled 2014-03-13: qty 1

## 2014-03-13 NOTE — Progress Notes (Signed)
[facility] ROUNDING NOTE   Subjective:   Interval History: diuresing. Morbidly obese.   Objective:  Vital signs in last 24 hours:  Temp:  [98 F (36.7 C)-98.3 F (36.8 C)] 98.3 F (36.8 C) (10/03 0500) Pulse Rate:  [80-91] 80 (10/03 0500) Resp:  [18] 18 (10/02 2300) BP: (103-144)/(61-75) 144/75 mmHg (10/03 0500) SpO2:  [93 %-100 %] 100 % (10/03 0500) Weight:  [111.205 kg (245 lb 2.6 oz)] 111.205 kg (245 lb 2.6 oz) (10/03 0500)  Weight change: 0.074 kg (2.6 oz) Filed Weights   03/11/14 1911 03/12/14 0500 03/13/14 0500  Weight: 109.4 kg (241 lb 2.9 oz) 109.907 kg (242 lb 4.8 oz) 111.205 kg (245 lb 2.6 oz)    Intake/Output: I/O last 3 completed shifts: In: 723 [P.O.:720; I.V.:3] Out: 500 [Urine:500]   Intake/Output this shift:  Total I/O In: 440 [P.O.:440] Out: -   CVS- RRR RS- CTA using oxygen  Slightly diminished ABD- BS present soft non-distended EXT- no edema   Basic Metabolic Panel:  Recent Labs Lab 03/11/14 1145 03/11/14 1202 03/12/14 0500 03/13/14 0507  NA 140 137 138 137  K 3.5* 3.2* 3.5* 3.9  CL 102 103 100 100  CO2 27  --  28 23  GLUCOSE 88 91 73 74  BUN 14 13 16 19   CREATININE 1.34* 1.40* 1.62* 1.86*  CALCIUM 9.7  --  9.6 9.2  PHOS  --   --   --  2.2*    Liver Function Tests:  Recent Labs Lab 03/11/14 1145 03/12/14 0500 03/13/14 0507  AST 19 13  --   ALT 6 6  --   ALKPHOS 107 105  --   BILITOT 1.1 0.9  --   PROT 7.6 6.9  --   ALBUMIN 2.9* 2.7* 2.8*   No results found for this basename: LIPASE, AMYLASE,  in the last 168 hours No results found for this basename: AMMONIA,  in the last 168 hours  CBC:  Recent Labs Lab 03/11/14 1145 03/11/14 1202 03/12/14 0500 03/13/14 0507  WBC 5.4  --  4.3 4.9  NEUTROABS 3.4  --   --   --   HGB 10.1* 12.6 9.6* 9.1*  HCT 32.3* 37.0 31.4* 29.2*  MCV 91.0  --  92.6 90.4  PLT 98*  --  120* 153    Cardiac Enzymes:  Recent Labs Lab 03/11/14 2133 03/12/14 0451  TROPONINI  <0.30 <0.30    BNP: No components found with this basename: POCBNP,   CBG:  Recent Labs Lab 03/12/14 0737 03/12/14 1148 03/12/14 1649 03/12/14 2139 03/13/14 0816  GLUCAP 77 119* 119* 106* 59    Microbiology: Results for orders placed during the hospital encounter of 03/11/14  URINE CULTURE     Status: None   Collection Time    03/11/14 12:00 PM      Result Value Ref Range Status   Specimen Description URINE, CATHETERIZED   Final   Special Requests NONE   Final   Culture  Setup Time     Final   Value: 03/11/2014 12:42     Performed at [place]     Final   Value: >=100,000 COLONIES/ML     Performed at [facility]   Culture     Final   Value: Multiple bacterial morphotypes present, none predominant. Suggest appropriate recollection if clinically indicated.     Performed at [facility]   Report Status 03/12/2014 FINAL   Final  CLOSTRIDIUM DIFFICILE BY PCR     Status: Abnormal   Collection Time    03/12/14 11:35 AM      Result Value Ref Range Status   C difficile by pcr POSITIVE (*) NEGATIVE Final   Comment: CRITICAL RESULT CALLED TO, READ BACK BY AND VERIFIED WITH:     ESharyn Lull RN 12:50 03/12/14 ([email or web address])    Coagulation Studies:  Recent Labs  03/11/14 1145 03/12/14 0500 03/13/14 0507  LABPROT 16.2* 17.3* 17.1*  INR 1.30 1.41 1.39    Urinalysis:  Recent Labs  03/11/14 1200  COLORURINE YELLOW  LABSPEC 1.008  PHURINE 6.0  GLUCOSEU NEGATIVE  HGBUR TRACE*  BILIRUBINUR NEGATIVE  KETONESUR 15*  PROTEINUR 100*  UROBILINOGEN 0.2  NITRITE NEGATIVE  LEUKOCYTESUR MODERATE*      Imaging: No results found.   Medications:     . allopurinol  100 mg Oral Daily  . atorvastatin  20 mg Oral QHS  . carvedilol  3.125 mg Oral BID WC  . ferrous sulfate  325 mg Oral BID  . furosemide  80 mg Intravenous Q8H  . hydrALAZINE  10 mg Oral TID  . insulin aspart  0-5 Units Subcutaneous QHS  . insulin aspart  0-9 Units  Subcutaneous TID WC  . isosorbide dinitrate  20 mg Oral TID  . levETIRAcetam  500 mg Oral BID  . methylphenidate  5 mg Oral Daily  . miconazole   Topical BID  . multivitamin with minerals  1 tablet Oral Daily  . pantoprazole  40 mg Oral Daily  . polyethylene glycol  17 g Oral Daily  . pregabalin  75 mg Oral BID  . sertraline  150 mg Oral Daily  . sodium chloride  3 mL Intravenous Q12H  . spironolactone  12.5 mg Oral Daily  . vancomycin  125 mg Oral QID  . warfarin  10 mg Oral ONCE-1800  . Warfarin - Pharmacist Dosing Inpatient   Does not apply q1800   LORazepam, traMADol  Assessment/ Plan:   CKD 3 . There seems no indication for dialysis and could not in good faith call this lady End Stage Renal Disease at this point.   Volume diuresing with lasix  Anemia stable  Will follow creatinine although it does not appear that this lady is ESRD.    LOS: 2 Briele Lagasse W @TODAY @12 :09 PM

## 2014-03-13 NOTE — Progress Notes (Addendum)
Subjective: [name] was seen and examined this morning along with nephrologist Dr. Justin Mend.  He informed [name] that she does not need to be on HD and she was happy to hear this.    She reports no change in the amount of diarrhea.  She otherwise seems to be doing ok compared to at admission.  Objective: Vital signs in last 24 hours: Filed Vitals:   03/12/14 1740 03/12/14 2100 03/12/14 2300 03/13/14 0500  BP: 144/67 103/61  144/75  Pulse: 91 85 83 80  Temp:  98 F (36.7 C)  98.3 F (36.8 C)  TempSrc:  Axillary  Axillary  Resp:  18 18   Height:      Weight:    111.205 kg (245 lb 2.6 oz)  SpO2:  93% 95% 100%   Weight change: 0.074 kg (2.6 oz)  Intake/Output Summary (Last 24 hours) at 03/13/14 0805 Last data filed at 03/13/14 0500  Gross per 24 hour  Intake    720 ml  Output      0 ml  Net    720 ml   Physical Exam: Appearance: morbidly obese woman lying in bed, occasionally closes eyes but opens when asked and is responding appropriately HEENT: AT/[place] Heart: RRR, normal S1S2  Lungs: diminished breath sounds throughout but difficult exam due to body habitus Abdomen: obese abdomen, diminished abdominal sounds but BS present, nontender GU: foley in place with ~ 100cc of clear yellow urine Extremities: right BKA and left AKA, well-healed. Right arm atrophied in comparison to left arm, right arm has resting tremor Neurologic: somnolent but A&Ox3, appropriate verbal responses   Lab Results: Basic Metabolic Panel:  Recent Labs Lab 03/12/14 0500 03/13/14 0507  NA 138 137  K 3.5* 3.9  CL 100 100  CO2 28 23  GLUCOSE 73 74  BUN 16 19  CREATININE 1.62* 1.86*  CALCIUM 9.6 9.2  PHOS  --  2.2*   Liver Function Tests:  Recent Labs Lab 03/11/14 1145 03/12/14 0500 03/13/14 0507  AST 19 13  --   ALT 6 6  --   ALKPHOS 107 105  --   BILITOT 1.1 0.9  --   PROT 7.6 6.9  --   ALBUMIN 2.9* 2.7* 2.8*   CBC:  Recent Labs Lab 03/11/14 1145  03/12/14 0500  03/13/14 0507  WBC 5.4  --  4.3 4.9  NEUTROABS 3.4  --   --   --   HGB 10.1*  < > 9.6* 9.1*  HCT 32.3*  < > 31.4* 29.2*  MCV 91.0  --  92.6 90.4  PLT 98*  --  120* 153  < > = values in this interval not displayed.  Coagulation:  Recent Labs Lab 03/11/14 1145 03/12/14 0500 03/13/14 0507  LABPROT 16.2* 17.3* 17.1*  INR 1.30 1.41 1.39   Medications: I have reviewed the patient's current medications. Scheduled Meds: . allopurinol  100 mg Oral Daily  . atorvastatin  20 mg Oral QHS  . carvedilol  3.125 mg Oral BID WC  . ferrous sulfate  325 mg Oral BID  . furosemide  80 mg Intravenous Q8H  . hydrALAZINE  10 mg Oral TID  . insulin aspart  0-5 Units Subcutaneous QHS  . insulin aspart  0-9 Units Subcutaneous TID WC  . isosorbide dinitrate  20 mg Oral TID  . levETIRAcetam  500 mg Oral BID  . methylphenidate  5 mg Oral Daily  . miconazole   Topical BID  .  multivitamin with minerals  1 tablet Oral Daily  . pantoprazole  40 mg Oral Daily  . polyethylene glycol  17 g Oral Daily  . pregabalin  75 mg Oral BID  . sertraline  150 mg Oral Daily  . sodium chloride  3 mL Intravenous Q12H  . spironolactone  12.5 mg Oral Daily  . vancomycin  125 mg Oral QID  . Warfarin - Pharmacist Dosing Inpatient   Does not apply q1800   Continuous Infusions:  PRN Meds:.LORazepam, traMADol  Assessment/Plan:  [name] is a 53 yo woman with multiple co-morbidities who presented from dialysis with sharp, burning chest pain on 10/1. She has been found to have a negative i-stat troponin and unchanged EKG and her XR showed extensive edema diffusely and her proBNP was elevated to 529[address]; however, the proBNP is frequently elevated in patients with CKD and the range of post-dialysis pro-BNP varies widely with abnormal distribution. She has also been found to be infected with c difficile.  Chest Pain of Uncertain Etiology in the context of CHF: Chest pain has not recurred.  Negative ACS work-up.  She - continue  lasix 80 mg IV TID daily for aggressive diuresis - Strict I's & O's - Continue nitro TID - Continue home protonix - Tele monitoring - 2D ECHO pending  Clostridium Difficile Infection: High risk for c.diff given ESRD - continue po vancomycin 125 mg four times per day; this should be continued for 10 to 14 days. This may be escalated to 500 mg at the same frequency if she does not show signs of improvement - Enteric precautions - CBC/fever monitoring  CKD3: Was on Tuesday, Thursday, Saturday dialysis through [facility]. First started on 9/22, received 4th treatment 10/1.  Nephrology feels the patient dose not need continued HD given relatively good GFR.  Appreciate renal input.  - continue IV Lasix 80mg  TID  - monitoring I/O and renal function  Acute on Chronic Systolic Heart Failure: Most recent LVEF 20%.  - continue IV diuretic as above - 2D ECHO pending - recheck CXR for improvement in edema since pulmonary exam is limited by morbid obesity - Continue home spironolactone, hydralazine and carvedilol   GERD: Has diagnosis of GERD, but does not take  - Started protonix; consider sending her home on this medication   DMII: Stable.  Most recent A1c in our records is 5.3% (but has been as high as 13.9%) with peripheral neuropathy and complications that led to her BKA and AKA. On 5 novolog with meals at home.  - HgbA1c 5.3% - Continue home Lyrica  - Continue ISS (sensitive renal), sliding scale at bedtime  Essential Hypertension:  - Continue home carvedilol, hydralazine and spironolactone  History of Multiple CVAs: Cognitive slowing and left-sided weakness. Patient is on coumadin, insulin and atorvastatin.   Atrial Fibrillation: Documented in [facility] records.  - Continue coumadin per pharmacy; patient also has a history of DVT   Morbid Obesity: bed bound at SNF  - Appreciate PT visit and OT attempted visit; recommend return to SNF  History of Seizure:  - Continue home keppra    Sleep Apnea:  - Continue home bipap at night   Depression and Anxiety:  - Continue home ativan and sertraline   DVT Ppx:  - Patient is on coumadin  Diet:  - HH / Carb Mod   Dispo: Disposition is deferred at this time, awaiting improvement of current medical problems.  Anticipated discharge in approximately 4 day(s).   The patient does  have a current PCP Rogene Houston, MD) and does not need an [facility] hospital follow-up appointment after discharge.  The patient does have transportation limitations that hinder transportation to clinic appointments.  .Services Needed at time of discharge: Y = Yes, Blank = No PT:   OT:   RN:   Equipment:   Other:     LOS: 2 days   Francesca Oman, DO 03/13/2014, 8:05 AM

## 2014-03-13 NOTE — Progress Notes (Signed)
ANTICOAGULATION CONSULT NOTE - Follow Up Consult  Pharmacy Consult for warfarin Indication: h/o CVA  Allergies  Allergen Reactions  . Ace Inhibitors Shortness Of Breath  . Latex Hives  . Lisinopril Other (See Comments)    Per MAR  . Vancomycin Other (See Comments)    Per [facility]    Patient Measurements: Height: 5' (152.4 cm) Weight: 245 lb 2.6 oz (111.205 kg) IBW/kg (Calculated) : 45.5  Vital Signs: Temp: 98.3 F (36.8 C) (10/03 0500) Temp Source: Axillary (10/03 0500) BP: 144/75 mmHg (10/03 0500) Pulse Rate: 80 (10/03 0500)  Labs:  Recent Labs  03/11/14 1145 03/11/14 1202 03/11/14 2133 03/12/14 0451 03/12/14 0500 03/13/14 0507  HGB 10.1* 12.6  --   --  9.6* 9.1*  HCT 32.3* 37.0  --   --  31.4* 29.2*  PLT 98*  --   --   --  120* 153  LABPROT 16.2*  --   --   --  17.3* 17.1*  INR 1.30  --   --   --  1.41 1.39  CREATININE 1.34* 1.40*  --   --  1.62* 1.86*  TROPONINI  --   --  <0.30 <0.30  --   --     Estimated Creatinine Clearance: 40.6 ml/min (by C-G formula based on Cr of 1.86).   Medications:  Scheduled:  . allopurinol  100 mg Oral Daily  . atorvastatin  20 mg Oral QHS  . carvedilol  3.125 mg Oral BID WC  . ferrous sulfate  325 mg Oral BID  . furosemide  80 mg Intravenous Q8H  . hydrALAZINE  10 mg Oral TID  . insulin aspart  0-5 Units Subcutaneous QHS  . insulin aspart  0-9 Units Subcutaneous TID WC  . isosorbide dinitrate  20 mg Oral TID  . levETIRAcetam  500 mg Oral BID  . methylphenidate  5 mg Oral Daily  . miconazole   Topical BID  . multivitamin with minerals  1 tablet Oral Daily  . pantoprazole  40 mg Oral Daily  . polyethylene glycol  17 g Oral Daily  . pregabalin  75 mg Oral BID  . sertraline  150 mg Oral Daily  . sodium chloride  3 mL Intravenous Q12H  . spironolactone  12.5 mg Oral Daily  . vancomycin  125 mg Oral QID  . Warfarin - Pharmacist Dosing Inpatient   Does not apply q1800    Assessment: 53 yo f admitted on 10/1 after chest  pain during HD.  Patient is on warfarin PTA at a dose of 6 mg PO daily for history of a CVA.  Pharmacy is consulted to dose and adjust warfarin while inpatient.  Patient's INR on admission was 1.41.  A 10 mg dose x 1 was given last night.  INR this AM is subtherapeutic at 1.39.  Will give another dose of 10 mg tonight and follow up INR in the AM for additional dosing.  Goal of Therapy:  INR 2-3 Monitor platelets by anticoagulation protocol: Yes   Plan:  Warfarin 10 mg tonight x 1 INR in AM to determine additional dosing Monitor INR, CBC, clinical course  Ndia Sampath L. Nicole Kindred, PharmD Clinical Pharmacy Resident Pager: [phone] 03/13/2014 11:31 AM

## 2014-03-13 NOTE — Progress Notes (Signed)
  Echocardiogram 2D Echocardiogram has been performed.  [name] 03/13/2014, 1:58 PM

## 2014-03-14 LAB — GLUCOSE, CAPILLARY
Glucose-Capillary: 95 mg/dL (ref 70–99)
Glucose-Capillary: 110 mg/dL — ABNORMAL HIGH (ref 70–99)
Glucose-Capillary: 131 mg/dL — ABNORMAL HIGH (ref 70–99)
Glucose-Capillary: 132 mg/dL — ABNORMAL HIGH (ref 70–99)

## 2014-03-14 LAB — RETICULOCYTES
RBC.: 3.22 MIL/uL — AB (ref 3.87–5.11)
RETIC CT PCT: 2.9 % (ref 0.4–3.1)
Retic Count, Absolute: 93.4 10*3/uL (ref 19.0–186.0)

## 2014-03-14 LAB — BASIC METABOLIC PANEL
ANION GAP: 11 (ref 5–15)
Anion gap: 11 (ref 5–15)
BUN: 21 mg/dL (ref 6–23)
BUN: 24 mg/dL — ABNORMAL HIGH (ref 6–23)
CALCIUM: 9 mg/dL (ref 8.4–10.5)
CALCIUM: 9.2 mg/dL (ref 8.4–10.5)
CO2: 25 mEq/L (ref 19–32)
CO2: 25 mEq/L (ref 19–32)
CREATININE: 2.18 mg/dL — AB (ref 0.50–1.10)
Chloride: 93 mEq/L — ABNORMAL LOW (ref 96–112)
Chloride: 95 mEq/L — ABNORMAL LOW (ref 96–112)
Creatinine, Ser: 2.13 mg/dL — ABNORMAL HIGH (ref 0.50–1.10)
GFR calc non Af Amer: 26 mL/min — ABNORMAL LOW (ref >90)
GFR calc non Af Amer: 25 mL/min — ABNORMAL LOW (ref >90)
GFR, EST AFRICAN AMERICAN: 30 mL/min — AB (ref >90)
GFR, EST AFRICAN AMERICAN: 29 mL/min — AB (ref >90)
Glucose, Bld: 99 mg/dL (ref 70–99)
Glucose, Bld: 129 mg/dL — ABNORMAL HIGH (ref 70–99)
Potassium: 3.9 mEq/L (ref 3.7–5.3)
Potassium: 3.9 mEq/L (ref 3.7–5.3)
SODIUM: 129 meq/L — AB (ref 137–147)
Sodium: 131 mEq/L — ABNORMAL LOW (ref 137–147)

## 2014-03-14 LAB — FOLATE: Folate: 12.6 ng/mL

## 2014-03-14 LAB — IRON AND TIBC
Iron: 26 ug/dL — ABNORMAL LOW (ref 42–135)
Saturation Ratios: 14 % — ABNORMAL LOW (ref 20–55)
TIBC: 180 ug/dL — AB (ref 250–470)
UIBC: 154 ug/dL (ref 125–400)

## 2014-03-14 LAB — CBC
HCT: 29.7 % — ABNORMAL LOW (ref 36.0–46.0)
Hemoglobin: 9.3 g/dL — ABNORMAL LOW (ref 12.0–15.0)
MCH: 28.9 pg (ref 26.0–34.0)
MCHC: 31.3 g/dL (ref 30.0–36.0)
MCV: 92.2 fL (ref 78.0–100.0)
PLATELETS: 187 10*3/uL (ref 150–400)
RBC: 3.22 MIL/uL — ABNORMAL LOW (ref 3.87–5.11)
RDW: 19.2 % — ABNORMAL HIGH (ref 11.5–15.5)
WBC: 4.5 10*3/uL (ref 4.0–10.5)

## 2014-03-14 LAB — VITAMIN B12: Vitamin B-12: 1348 pg/mL — ABNORMAL HIGH (ref 211–911)

## 2014-03-14 LAB — PROTIME-INR
INR: 1.48 (ref 0.00–1.49)
PROTHROMBIN TIME: 17.9 s — AB (ref 11.6–15.2)

## 2014-03-14 LAB — FERRITIN: Ferritin: 198 ng/mL (ref 10–291)

## 2014-03-14 MED ORDER — WARFARIN SODIUM 10 MG PO TABS
10.0000 mg | ORAL_TABLET | Freq: Once | ORAL | Status: AC
  Administered 2014-03-14: 10 mg via ORAL
  Filled 2014-03-14: qty 1

## 2014-03-14 NOTE — Progress Notes (Signed)
  I have seen and examined the patient myself, and I have reviewed the note by Theressa Stamps, MS 3 and was present during the interview and physical exam.  Please see my separate H&P for additional findings, assessment, and plan.   Signed: Drucilla Schmidt, MD 03/14/2014, 10:43 AM

## 2014-03-14 NOTE — H&P (Signed)
  I have seen and examined the patient myself, and I have reviewed the note by Theressa Stamps, MS 3 and was present during the interview and physical exam.  Please see my separate H&P for additional findings, assessment, and plan.   Signed: Drucilla Schmidt, MD 03/14/2014, 10:42 AM

## 2014-03-14 NOTE — Clinical Social Work Psychosocial (Signed)
Clinical Social Work Department BRIEF PSYCHOSOCIAL ASSESSMENT 03/14/2014  Patient:  [name], [name]     Account Number:  [id number]     Admit date:  03/11/2014  Clinical Social Worker:  Hubert Azure  Date/Time:  03/14/2014 08:18 PM  Referred by:  Physician  Date Referred:  03/14/2014 Referred for  SNF Placement   Other Referral:   Interview type:  Family Other interview type:   [name]    PSYCHOSOCIAL DATA Living Status:  FACILITY Admitted from facility:  Other Level of care:  [place] Primary support name:  Jackie Plum Primary support relationship to patient:  [place] Degree of support available:   Good, patient sister present at bedside.    CURRENT CONCERNS Current Concerns  Post-Acute Placement   Other Concerns:    SOCIAL WORK ASSESSMENT / PLAN CSW met with patient and sister who was present at bedside. CSW introduced self and explained role. Per sister, patient has been a resident of [facility] for 2 years and is a double amputee. Patient and sister are agreeable to return to [facility].   Assessment/plan status:  Other - See comment Other assessment/ plan:   CSW to update FL2 for return to [facility].   Information/referral to community resources:    PATIENT'S/FAMILY'S RESPONSE TO PLAN OF CARE: Patient and sister were pleasant and cooperative and thanked CSW for visit.   [place], [place] Clinical Social Worker [phone]

## 2014-03-14 NOTE — Progress Notes (Signed)
Subjective: Ms. Schneiter was seen and examined this morning along with Theressa Stamps MS3.  She reports feeling better.  She ate most of her breakfast this AM and feels her appetite is improving.  She has some belly pain and diarrhea is unchanged.  She denies CP or dyspnea.  She says she feels anxious and is wondering if it is time for her anxiety medicine.  Objective: Vital signs in last 24 hours: Filed Vitals:   03/13/14 0500 03/13/14 1451 03/13/14 2151 03/14/14 0526  BP: 144/75 133/57 133/43 143/67  Pulse: 80 87 93 93  Temp: 98.3 F (36.8 C) 98 F (36.7 C) 98.5 F (36.9 C) 98.4 F (36.9 C)  TempSrc: Axillary Oral Oral Oral  Resp:  15 18 16   Height:      Weight: 111.205 kg (245 lb 2.6 oz)   112.583 kg (248 lb 3.2 oz)  SpO2: 100% 99% 98% 100%   Weight change: 1.378 kg (3 lb 0.6 oz)  Intake/Output Summary (Last 24 hours) at 03/14/14 1156 Last data filed at 03/14/14 0930  Gross per 24 hour  Intake   1520 ml  Output    450 ml  Net   1070 ml   Physical Exam: Appearance: morbidly obese woman lying in bed watching tv, occasionally closes eyes but opens when asked HEENT: AT/[place] Heart: RRR, no m/r/g Lungs: anterior - diminished breath sounds throughout but difficult exam due to body habitus Abdomen: obese abdomen, + BS present, minimal epigastric TTP, no rebound/guarding/ridigity, abdomen is soft GU: foley in place with ~ 100cc of clear yellow urine Extremities: right BKA and left AKA, well-healed. Right arm atrophied in comparison to left arm, right arm has resting tremor Neurologic:A&Ox3, appropriate verbal responses   Lab Results: Basic Metabolic Panel:  Recent Labs Lab 03/12/14 0500 03/13/14 0507 03/14/14 0308  NA 138 137 129*  K 3.5* 3.9 3.9  CL 100 100 93*  CO2 28 23 25   GLUCOSE 73 74 99  BUN 16 19 21   CREATININE 1.62* 1.86* 2.13*  CALCIUM 9.6 9.2 9.0  PHOS  --  2.2*  --    CBC:  Recent Labs Lab 03/11/14 1145  03/13/14 0507 03/14/14 0308  WBC 5.4  < >  4.9 4.5  NEUTROABS 3.4  --   --   --   HGB 10.1*  < > 9.1* 9.3*  HCT 32.3*  < > 29.2* 29.7*  MCV 91.0  < > 90.4 92.2  PLT 98*  < > 153 187  < > = values in this interval not displayed.  Coagulation:  Recent Labs Lab 03/11/14 1145 03/12/14 0500 03/13/14 0507 03/14/14 0308  LABPROT 16.2* 17.3* 17.1* 17.9*  INR 1.30 1.41 1.39 1.48   Medications: I have reviewed the patient's current medications. Scheduled Meds: . allopurinol  100 mg Oral Daily  . antiseptic oral rinse  7 mL Mouth Rinse q12n4p  . atorvastatin  20 mg Oral QHS  . carvedilol  3.125 mg Oral BID WC  . chlorhexidine  15 mL Mouth Rinse BID  . ferrous sulfate  325 mg Oral BID  . furosemide  80 mg Intravenous Q8H  . hydrALAZINE  10 mg Oral TID  . insulin aspart  0-5 Units Subcutaneous QHS  . insulin aspart  0-9 Units Subcutaneous TID WC  . isosorbide dinitrate  20 mg Oral TID  . levETIRAcetam  500 mg Oral BID  . methylphenidate  5 mg Oral Daily  . miconazole   Topical BID  . multivitamin  with minerals  1 tablet Oral Daily  . pantoprazole  40 mg Oral Daily  . polyethylene glycol  17 g Oral Daily  . pregabalin  75 mg Oral BID  . sertraline  150 mg Oral Daily  . sodium chloride  3 mL Intravenous Q12H  . spironolactone  12.5 mg Oral Daily  . vancomycin  125 mg Oral QID  . warfarin  10 mg Oral ONCE-1800  . Warfarin - Pharmacist Dosing Inpatient   Does not apply q1800   Continuous Infusions:  PRN Meds:.LORazepam, traMADol  Assessment/Plan:  Ms. Blomgren is a 53 yo woman with multiple co-morbidities who presented from dialysis with sharp, burning chest pain on 10/1. She has been found to have a negative i-stat troponin and unchanged EKG and her XR showed extensive edema diffusely and her proBNP was elevated to [address]; however, the proBNP is frequently elevated in patients with CKD and the range of post-dialysis pro-BNP varies widely with abnormal distribution. She has also been found to be infected with c  difficile.  Chest Pain of uncertain Etiology in the context of CHF: Chest pain has not recurred.  Negative ACS work-up.  CP reported as stabbing and burning so perhaps related to untreated GERD (she has a GERD hx).  The most likely etiology is volume overload due to acute exacerbation of heart failure.   I&Os have not been accurately documented despite foley in place.  Weight has been recorded as 3 pound increase. - continue lasix 80 mg IV TID daily for aggressive diuresis - Strict I's & O's - Continue nitro TID - Continue home protonix - Tele monitoring  Abdominal pain:  She reports intermittent indigestion and some abdominal pain that is improving.  Her abdominal exam is reassuring (soft, +BS, no peritoneal signs) and she is eating without nausea/vomiting. - will monitor for change in abdominal exam; can consider abdominal xray if signs and symptoms change - continue protonix for reflux - continue tramadol for pain (home medication) - continue trx for Cdiff as below  Clostridium Difficile Infection:  The patient complained of fever prior to admission and reported diarrhea.  She feels the amount of diarrhea has not changed.   She has not had fever or leukocytosis this admission. - continue po vancomycin 125 mg four times per day; this should be continued for 10 to 14 days. This may be escalated to 500 mg at the same frequency if she does not show signs of improvement - Enteric precautions - CBC/fever monitoring  CKD3: Nephrology feels the patient dose not need continued HD given relatively good GFR.  Appreciate renal input.  Cr 1.34-->2.13 this admission with aggressive diuresis.  She likely needs continued diuresis and we will need strict I&Os to monitor progress. - continue IV Lasix 80mg  TID  - monitoring I/O and renal function - appreciate nephrology arranging removal of TDC while patient is here  Acute on Chronic Systolic Heart Failure:  Likely contributing to chest pain at  presentation.  proBNP 52,920 this admission (compared to 1850 two years ago).  2D ECHO this admission revealed EF of 35-40%.  It is difficult to appreciate changes on lung exam due to body habitus however CXR shows improvement and the patient feels better.  - continue IV diuretic as above - Continue home spironolactone, hydralazine and carvedilol  - strict I&Os  Hyponatremia:  Na 129 this AM.  She is AAO x3 and not confused.  She is not nauseous.  Drop in Na is possibly due to Lasix  or volume overload, however this would be an abrupt change (Na 137 yesterday) so I question accuracy.   - repeat BMP now; will obtain serum osms, urine osms/electrolytes if hyponatremia persists - continue to diurese and monitor renal function  GERD: Has diagnosis of GERD, but does not take PPI.  - Started protonix; consider sending her home on this medication   DMII: Stable.  Most recent A1c in our records is 5.3% (but has been as high as 13.9%) with peripheral neuropathy and complications that led to her BKA and AKA. On 5 novolog with meals at home.  - Continue home Lyrica for neuropathy and phantom limb pain. - Continue ISS (sensitive renal), sliding scale at bedtime  Essential Hypertension: stable - Continue home carvedilol, hydralazine and spironolactone  History of Multiple CVAs: Residual deficits include cognitive slowing and left-sided weakness. Patient is on coumadin, atorvastatin.   Atrial Fibrillation: Documented in [facility] records.  - Continue coumadin per pharmacy; patient also has a history of DVT   Morbid Obesity: bed bound at SNF  - Appreciate PT visit and OT attempted visit; recommend return to SNF  History of Seizure:  - Continue home keppra   Sleep Apnea:  - Continue home bipap at night   Depression and Anxiety: She reports she is feeling anxious this morning.  Last prn was about 4 hours prior so I asked RN to give prn ativan. - Continue home ativan and sertraline   DVT Ppx:  -  Patient is on coumadin  Diet: HH / Carb Mod   Dispo: Disposition is deferred at this time, awaiting improvement of current medical problems.  Anticipated discharge in approximately 4 day(s).   The patient does have a current PCP Rogene Houston, MD) and does not need an [facility] hospital follow-up appointment after discharge.  The patient does have transportation limitations that hinder transportation to clinic appointments.  .Services Needed at time of discharge: Y = Yes, Blank = No PT:   OT:   RN:   Equipment:   Other:     LOS: 3 days   Francesca Oman, DO 03/14/2014, 11:56 AM

## 2014-03-14 NOTE — Progress Notes (Signed)
ANTICOAGULATION CONSULT NOTE - Follow Up Consult  Pharmacy Consult for warfarin Indication: h/o CVA  Allergies  Allergen Reactions  . Ace Inhibitors Shortness Of Breath  . Latex Hives  . Lisinopril Other (See Comments)    Per MAR  . Vancomycin Other (See Comments)    Per [facility]    Patient Measurements: Height: 5' (152.4 cm) Weight: 248 lb 3.2 oz (112.583 kg) IBW/kg (Calculated) : 45.5  Vital Signs: Temp: 98.4 F (36.9 C) (10/04 0526) Temp Source: Oral (10/04 0526) BP: 143/67 mmHg (10/04 0526) Pulse Rate: 93 (10/04 0526)  Labs:  Recent Labs  03/11/14 2133 03/12/14 0451 03/12/14 0500 03/13/14 0507 03/14/14 0308  HGB  --   --  9.6* 9.1* 9.3*  HCT  --   --  31.4* 29.2* 29.7*  PLT  --   --  120* 153 187  LABPROT  --   --  17.3* 17.1* 17.9*  INR  --   --  1.41 1.39 1.48  CREATININE  --   --  1.62* 1.86* 2.13*  TROPONINI <0.30 <0.30  --   --   --     Estimated Creatinine Clearance: 35.7 ml/min (by C-G formula based on Cr of 2.13).   Medications:  Scheduled:  . allopurinol  100 mg Oral Daily  . antiseptic oral rinse  7 mL Mouth Rinse q12n4p  . atorvastatin  20 mg Oral QHS  . carvedilol  3.125 mg Oral BID WC  . chlorhexidine  15 mL Mouth Rinse BID  . ferrous sulfate  325 mg Oral BID  . furosemide  80 mg Intravenous Q8H  . hydrALAZINE  10 mg Oral TID  . insulin aspart  0-5 Units Subcutaneous QHS  . insulin aspart  0-9 Units Subcutaneous TID WC  . isosorbide dinitrate  20 mg Oral TID  . levETIRAcetam  500 mg Oral BID  . methylphenidate  5 mg Oral Daily  . miconazole   Topical BID  . multivitamin with minerals  1 tablet Oral Daily  . pantoprazole  40 mg Oral Daily  . polyethylene glycol  17 g Oral Daily  . pregabalin  75 mg Oral BID  . sertraline  150 mg Oral Daily  . sodium chloride  3 mL Intravenous Q12H  . spironolactone  12.5 mg Oral Daily  . vancomycin  125 mg Oral QID  . Warfarin - Pharmacist Dosing Inpatient   Does not apply q1800    Assessment: 53  yo f admitted on 10/1 after chest pain during HD. Patient is on warfarin PTA at a dose of 6 mg PO daily for history of a CVA. Pharmacy is consulted to dose and adjust warfarin while inpatient. Patient's INR on admission was 1.41.  Patient has received two doses of 10 mg with INR still subtherapeutic this AM at 1.48.  Will give another dose of 10 mg tonight hoping the INR continues to become closer to 2-3.  Hgb 9.3, plts 187, no s/s of bleeding.  Goal of Therapy:  INR 2-3 Monitor platelets by anticoagulation protocol: Yes   Plan:  Warfarin 10 mg tonight x 1 INR in the AM to determine additional dosing Monitor INR, CBC, s/s of bleeding, clinical course  Dejaun Vidrio L. Nicole Kindred, PharmD Clinical Pharmacy Resident Pager: [phone] 03/14/2014 11:51 AM

## 2014-03-14 NOTE — Progress Notes (Signed)
RT was called by RN to place patient on the CPAP. RT entered room to place patient on the CPAP and the patient didn't want to stop eating her ice and drinking water. RT didn't place the patient on the CPAP and notified RN.  RT will continue to monitor.

## 2014-03-14 NOTE — Progress Notes (Addendum)
Pharmacist Heart Failure Core Measure Documentation  Assessment: [name] has an EF documented as 35% on 03/13/14 by echo.  Rationale: Heart failure patients with left ventricular systolic dysfunction (LVSD) and an EF < 40% should be prescribed an angiotensin converting enzyme inhibitor (ACEI) or angiotensin receptor blocker (ARB) at discharge unless a contraindication is documented in the medical record.  This patient is not currently on an ACEI or ARB for HF.  This note is being placed in the record in order to provide documentation that a contraindication to the use of these agents is present for this encounter.  ACE Inhibitor or Angiotensin Receptor Blocker is contraindicated (specify all that apply)  [x]   ACEI allergy AND ARB allergy []   Angioedema []   Moderate or severe aortic stenosis []   Hyperkalemia []   Hypotension []   Renal artery stenosis [x]   Worsening renal function, preexisting renal disease or dysfunction  [name], PharmD Clinical Pharmacy Resident Pager: [phone] 03/14/2014 2:54 PM

## 2014-03-14 NOTE — Progress Notes (Signed)
  I have seen and examined the patient, and reviewed the daily progress note by Theressa Stamps, MS3 and discussed the care of the patient with them. Please see my progress note from 03/14/2014 for further details regarding assessment and plan.    Signed:  Francesca Oman, DO 03/14/2014, 1:55 PM

## 2014-03-14 NOTE — Progress Notes (Signed)
Subjective: Patient says that she is feeling well.  She says that she was able to eat most of her breakfast this morning but reports new onset of mild epigastric pain that is relived with eating.  She says that her diarrhea remains unchanged from yesterday.  Reported some intermittent chest pain but felt that it was due to indigestion. She also noted feeling anxious and said that she felt like her anxiety was kicking in ans does so whenever she feels like she isn't in control.     Objective: Vital signs in last 24 hours: Filed Vitals:   03/13/14 0500 03/13/14 1451 03/13/14 2151 03/14/14 0526  BP: 144/75 133/57 133/43 143/67  Pulse: 80 87 93 93  Temp: 98.3 F (36.8 C) 98 F (36.7 C) 98.5 F (36.9 C) 98.4 F (36.9 C)  TempSrc: Axillary Oral Oral Oral  Resp:  15 18 16   Height:      Weight: 111.205 kg (245 lb 2.6 oz)   112.583 kg (248 lb 3.2 oz)  SpO2: 100% 99% 98% 100%   Weight change: 1.378 kg (3 lb 0.6 oz)  Intake/Output Summary (Last 24 hours) at 03/14/14 1218 Last data filed at 03/14/14 0930  Gross per 24 hour  Intake   1200 ml  Output    450 ml  Net    750 ml    GEN: patient morbidly obese,  lying in bed with washcloth below chin, responds to questions appropriately  Heart: RR no murmurs, rubs, gallops  Lungs: diminished breath sounds anteriorly, examination limited by patients body habitus  Abdomen: obese abdomen, soft, slightly tender BS+ but diminished GU: pt has foley in place with ~300 cc of clear yellow urine  Extremities: right BKA, left AKA,  Right arm smaller in size than right ,   Right arm has resting tremor Neuro somnolent but A&O x 3    Lab Results: CBC    Component Value Date/Time   WBC 4.5 03/14/2014 0308   RBC 3.22* 03/14/2014 0308   RBC 3.22* 03/14/2014 0308   HGB 9.3* 03/14/2014 0308   HCT 29.7* 03/14/2014 0308   PLT 187 03/14/2014 0308   MCV 92.2 03/14/2014 0308   MCH 28.9 03/14/2014 0308   MCHC 31.3 03/14/2014 0308   RDW 19.2* 03/14/2014 0308   LYMPHSABS 1.0 03/11/2014 1145   MONOABS 0.7 03/11/2014 1145   EOSABS 0.2 03/11/2014 1145   BASOSABS 0.0 03/11/2014 1145   . CMP     Component Value Date/Time   NA 129* 03/14/2014 0308   K 3.9 03/14/2014 0308   CL 93* 03/14/2014 0308   CO2 25 03/14/2014 0308   GLUCOSE 99 03/14/2014 0308   BUN 21 03/14/2014 0308   CREATININE 2.13* 03/14/2014 0308   CALCIUM 9.0 03/14/2014 0308   PROT 6.9 03/12/2014 0500   ALBUMIN 2.8* 03/13/2014 0507   AST 13 03/12/2014 0500   ALT 6 03/12/2014 0500   ALKPHOS 105 03/12/2014 0500   BILITOT 0.9 03/12/2014 0500   GFRNONAA 26* 03/14/2014 0308   GFRAA 30* 03/14/2014 0308    Micro Results: Recent Results (from the past 240 hour(s))  URINE CULTURE     Status: None   Collection Time    03/11/14 12:00 PM      Result Value Ref Range Status   Specimen Description URINE, CATHETERIZED   Final   Special Requests NONE   Final   Culture  Setup Time     Final   Value: 03/11/2014 12:42  Performed at [facility]     Final   Value: >=100,000 COLONIES/ML     Performed at [facility]   Culture     Final   Value: Multiple bacterial morphotypes present, none predominant. Suggest appropriate recollection if clinically indicated.     Performed at [facility]   Report Status 03/12/2014 FINAL   Final  CLOSTRIDIUM DIFFICILE BY PCR     Status: Abnormal   Collection Time    03/12/14 11:35 AM      Result Value Ref Range Status   C difficile by pcr POSITIVE (*) NEGATIVE Final   Comment: CRITICAL RESULT CALLED TO, READ BACK BY AND VERIFIED WITH:     Sheppard Evens RN 12:50 03/12/14 ([email or web address])   Studies/Results: Dg Chest Port 1 View  03/13/2014   CLINICAL DATA:  Evaluate pulmonary edema after diuresis, followup congestive heart failure  EXAM: PORTABLE CHEST - 1 VIEW  COMPARISON:  03/11/2014  FINDINGS: Very limited inspiratory effect in the patient's chin obscures the upper lung zones. No change right dual lumen central line. No change in cardiac  enlargement.  Left lung appears clear. There remains mild hazy right perihilar opacity, although right-sided aeration is notably improved. Small right pleural effusions suspected.  IMPRESSION: Limited study with poor inspiratory effect. Significantly improved bilateral aeration with some mild residual right perihilar opacity suggesting mild lingering edema.   Electronically Signed   By: Skipper Cliche M.D.   On: 03/13/2014 21:19   Medications: I have reviewed the patient's current medications. Scheduled Meds: . allopurinol  100 mg Oral Daily  . antiseptic oral rinse  7 mL Mouth Rinse q12n4p  . atorvastatin  20 mg Oral QHS  . carvedilol  3.125 mg Oral BID WC  . chlorhexidine  15 mL Mouth Rinse BID  . ferrous sulfate  325 mg Oral BID  . furosemide  80 mg Intravenous Q8H  . hydrALAZINE  10 mg Oral TID  . insulin aspart  0-5 Units Subcutaneous QHS  . insulin aspart  0-9 Units Subcutaneous TID WC  . isosorbide dinitrate  20 mg Oral TID  . levETIRAcetam  500 mg Oral BID  . methylphenidate  5 mg Oral Daily  . miconazole   Topical BID  . multivitamin with minerals  1 tablet Oral Daily  . pantoprazole  40 mg Oral Daily  . polyethylene glycol  17 g Oral Daily  . pregabalin  75 mg Oral BID  . sertraline  150 mg Oral Daily  . sodium chloride  3 mL Intravenous Q12H  . spironolactone  12.5 mg Oral Daily  . vancomycin  125 mg Oral QID  . warfarin  10 mg Oral ONCE-1800  . Warfarin - Pharmacist Dosing Inpatient   Does not apply q1800   Continuous Infusions:  PRN Meds:.LORazepam, traMADol Assessment/Plan: Principal Problem:   Acute on chronic systolic heart failure Active Problems:   Type 2 diabetes mellitus with complication   Anxiety state   GERD   Morbid obesity   Anemia   Essential hypertension   Chronic kidney disease, stage 3   History of CVA (cerebrovascular accident)   History of seizure  Ms. Paull is a 53 year old woman with multiple co-morbidities who presented from dialysis  with stabbing burning chest pain, headache and self reported fever now with c.diff infection.   Chest pain of uncertain etiology: ACS workup that was performed on admission was negative.  Patient denies having chest pain that would  make [place] suspicious for this.  Her chest pain she characterizes is likely due to GERD or acute exacerbation of her CHF.   - Continue Protonix  - continue tele monitoring  - continue nitro TID  - strict I's & O's   Clostridium difficile infection/Epigastric pain: Patient noted loosening of her stools on 10/2 and was C.Diff PCR positive on 10/02.  She is high risk for C.Diff given her decreased kidney function  - Continue vancomycin po 125 mg four times per day for 10-14 days  - remain on enteric precautions  - KUB plain film if symptoms of epigastric pain worsen to r/o complications of C. Diff  - consult SW to make sure no complications exist for going back to SNF   CKD3: Patient was seen by nephrology yesterday who determined that she was not ESRD as previously thought.  Also suggested stopping HD.  - continue Lasix 80 mg TID  - continue monitoring renal function and I's & O's   Acute on Chronic systolic heart failure:  Patients had CXR yesterday 10/3 which showed improvement from day of admission.  Less edematous. ECHO also showed that her EF is 35-40% instead of 20% as was suggested from records obtained from [facility]  - continue IV Lasix  - Continue home spironolactone, hydralazine and carvedilol  GERD: Pt has hx of GERD but was not taking home med for this, She reports GERD- like symptoms here in hospital - Continue protonix  DMII/ peripheral neuropathy: No change A1c 5.3% patient has BKA and AKA.  DMII is controled with insulin  - Continue home lyrica - continue ISS  HTN  - continue home spironolactone, hydralazine and carvedilol  Hx of Seizure  - continue home keppra  Sleep Apnea - continue bipap at night   Depression and anxiety:  -  continue Ativan and sertraline   Morbid Obesity: patient is bed bound and does not move much  - Consult to PT   DVT Ppx:  - patient is on coumadin  This is a Careers information officer Note.  The care of the patient was discussed with Dr. Redmond Pulling and the assessment and plan formulated with their assistance.  Please see their attached note for official documentation of the daily encounter.   LOS: 3 days   Linton Ham, Med Student 03/14/2014, 12:18 PM

## 2014-03-14 NOTE — Progress Notes (Signed)
[facility] ROUNDING NOTE   Subjective:   Interval History:no complaints resting in bed this morning In and Outs not clearly documented . Weight is up by 2.5 KG   Morbidly obese  Objective:  Vital signs in last 24 hours:  Temp:  [98 F (36.7 C)-98.5 F (36.9 C)] 98.4 F (36.9 C) (10/04 0526) Pulse Rate:  [87-93] 93 (10/04 0526) Resp:  [15-18] 16 (10/04 0526) BP: (133-143)/(43-67) 143/67 mmHg (10/04 0526) SpO2:  [98 %-100 %] 100 % (10/04 0526) Weight:  [112.583 kg (248 lb 3.2 oz)] 112.583 kg (248 lb 3.2 oz) (10/04 0526)  Weight change: 1.378 kg (3 lb 0.6 oz) Filed Weights   03/12/14 0500 03/13/14 0500 03/14/14 0526  Weight: 109.907 kg (242 lb 4.8 oz) 111.205 kg (245 lb 2.6 oz) 112.583 kg (248 lb 3.2 oz)    Intake/Output: I/O last 3 completed shifts: In: 2120 [P.O.:2120] Out: 61 [Urine:450]   Intake/Output this shift:  Total I/O In: 480 [P.O.:480] Out: -   CVS- RRR  RS- CTA using oxygen Slightly diminished  ABD- BS present soft non-distended  EXT- no edema bilateral amputations    Basic Metabolic Panel:  Recent Labs Lab 03/11/14 1145 03/11/14 1202 03/12/14 0500 03/13/14 0507 03/14/14 0308 03/14/14 1249  NA 140 137 138 137 129* 131*  K 3.5* 3.2* 3.5* 3.9 3.9 3.9  CL 102 103 100 100 93* 95*  CO2 27  --  28 23 25 25   GLUCOSE 88 91 73 74 99 129*  BUN 14 13 16 19 21  24*  CREATININE 1.34* 1.40* 1.62* 1.86* 2.13* 2.18*  CALCIUM 9.7  --  9.6 9.2 9.0 9.2  PHOS  --   --   --  2.2*  --   --     Liver Function Tests:  Recent Labs Lab 03/11/14 1145 03/12/14 0500 03/13/14 0507  AST 19 13  --   ALT 6 6  --   ALKPHOS 107 105  --   BILITOT 1.1 0.9  --   PROT 7.6 6.9  --   ALBUMIN 2.9* 2.7* 2.8*   No results found for this basename: LIPASE, AMYLASE,  in the last 168 hours No results found for this basename: AMMONIA,  in the last 168 hours  CBC:  Recent Labs Lab 03/11/14 1145 03/11/14 1202 03/12/14 0500 03/13/14 0507 03/14/14 0308   WBC 5.4  --  4.3 4.9 4.5  NEUTROABS 3.4  --   --   --   --   HGB 10.1* 12.6 9.6* 9.1* 9.3*  HCT 32.3* 37.0 31.4* 29.2* 29.7*  MCV 91.0  --  92.6 90.4 92.2  PLT 98*  --  120* 153 187    Cardiac Enzymes:  Recent Labs Lab 03/11/14 2133 03/12/14 0451  TROPONINI <0.30 <0.30    BNP: No components found with this basename: POCBNP,   CBG:  Recent Labs Lab 03/13/14 1202 03/13/14 1734 03/13/14 2207 03/14/14 0733 03/14/14 1158  GLUCAP 100* 93 100* 95 110*    Microbiology: Results for orders placed during the hospital encounter of 03/11/14  URINE CULTURE     Status: None   Collection Time    03/11/14 12:00 PM      Result Value Ref Range Status   Specimen Description URINE, CATHETERIZED   Final   Special Requests NONE   Final   Culture  Setup Time     Final   Value: 03/11/2014 12:42     Performed at [facility]  Count     Final   Value: >=100,000 COLONIES/ML     Performed at [facility]   Culture     Final   Value: Multiple bacterial morphotypes present, none predominant. Suggest appropriate recollection if clinically indicated.     Performed at [facility]   Report Status 03/12/2014 FINAL   Final  CLOSTRIDIUM DIFFICILE BY PCR     Status: Abnormal   Collection Time    03/12/14 11:35 AM      Result Value Ref Range Status   C difficile by pcr POSITIVE (*) NEGATIVE Final   Comment: CRITICAL RESULT CALLED TO, READ BACK BY AND VERIFIED WITH:     ESharyn Lull RN 12:50 03/12/14 ([email or web address])    Coagulation Studies:  Recent Labs  03/12/14 0500 03/13/14 0507 03/14/14 0308  LABPROT 17.3* 17.1* 17.9*  INR 1.41 1.39 1.48    Urinalysis: No results found for this basename: COLORURINE, APPERANCEUR, LABSPEC, PHURINE, GLUCOSEU, HGBUR, BILIRUBINUR, KETONESUR, PROTEINUR, UROBILINOGEN, NITRITE, LEUKOCYTESUR,  in the last 72 hours    Imaging: Dg Chest Port 1 View  03/13/2014   CLINICAL DATA:  Evaluate pulmonary edema after diuresis, followup  congestive heart failure  EXAM: PORTABLE CHEST - 1 VIEW  COMPARISON:  03/11/2014  FINDINGS: Very limited inspiratory effect in the patient's chin obscures the upper lung zones. No change right dual lumen central line. No change in cardiac enlargement.  Left lung appears clear. There remains mild hazy right perihilar opacity, although right-sided aeration is notably improved. Small right pleural effusions suspected.  IMPRESSION: Limited study with poor inspiratory effect. Significantly improved bilateral aeration with some mild residual right perihilar opacity suggesting mild lingering edema.   Electronically Signed   By: Skipper Cliche M.D.   On: 03/13/2014 21:19     Medications:     . allopurinol  100 mg Oral Daily  . antiseptic oral rinse  7 mL Mouth Rinse q12n4p  . atorvastatin  20 mg Oral QHS  . carvedilol  3.125 mg Oral BID WC  . chlorhexidine  15 mL Mouth Rinse BID  . ferrous sulfate  325 mg Oral BID  . furosemide  80 mg Intravenous Q8H  . hydrALAZINE  10 mg Oral TID  . insulin aspart  0-5 Units Subcutaneous QHS  . insulin aspart  0-9 Units Subcutaneous TID WC  . isosorbide dinitrate  20 mg Oral TID  . levETIRAcetam  500 mg Oral BID  . methylphenidate  5 mg Oral Daily  . miconazole   Topical BID  . multivitamin with minerals  1 tablet Oral Daily  . pantoprazole  40 mg Oral Daily  . polyethylene glycol  17 g Oral Daily  . pregabalin  75 mg Oral BID  . sertraline  150 mg Oral Daily  . sodium chloride  3 mL Intravenous Q12H  . spironolactone  12.5 mg Oral Daily  . vancomycin  125 mg Oral QID  . warfarin  10 mg Oral ONCE-1800  . Warfarin - Pharmacist Dosing Inpatient   Does not apply q1800   LORazepam, traMADol  Assessment and Plan  CKD 3 . There seems no indication for dialysis and could not in good faith call this lady End Stage Renal Disease at this point.  Volume diuresing with lasix although weight did increase Anemia stable  Will follow creatinine although it does not  appear that this lady is ESRD. I would like to see documentation of diuresis and urine output recorded. Nursing staff have been asked to  monitor the urine output and record it.       LOS: 3 WEBB,MARTIN W @TODAY @2 :24 PM

## 2014-03-15 ENCOUNTER — Inpatient Hospital Stay (HOSPITAL_COMMUNITY): Payer: Medicare Other

## 2014-03-15 DIAGNOSIS — A0472 Enterocolitis due to Clostridium difficile, not specified as recurrent: Secondary | ICD-10-CM | POA: Diagnosis present

## 2014-03-15 LAB — BASIC METABOLIC PANEL
Anion gap: 13 (ref 5–15)
BUN: 25 mg/dL — AB (ref 6–23)
CHLORIDE: 93 meq/L — AB (ref 96–112)
CO2: 23 meq/L (ref 19–32)
CREATININE: 2.21 mg/dL — AB (ref 0.50–1.10)
Calcium: 9 mg/dL (ref 8.4–10.5)
GFR calc Af Amer: 28 mL/min — ABNORMAL LOW (ref >90)
GFR calc non Af Amer: 25 mL/min — ABNORMAL LOW (ref >90)
Glucose, Bld: 115 mg/dL — ABNORMAL HIGH (ref 70–99)
POTASSIUM: 4.2 meq/L (ref 3.7–5.3)
Sodium: 129 mEq/L — ABNORMAL LOW (ref 137–147)

## 2014-03-15 LAB — GLUCOSE, CAPILLARY
Glucose-Capillary: 96 mg/dL (ref 70–99)
Glucose-Capillary: 125 mg/dL — ABNORMAL HIGH (ref 70–99)
Glucose-Capillary: 122 mg/dL — ABNORMAL HIGH (ref 70–99)
Glucose-Capillary: 129 mg/dL — ABNORMAL HIGH (ref 70–99)

## 2014-03-15 LAB — CREATININE, URINE, RANDOM: CREATININE, URINE: 30.96 mg/dL

## 2014-03-15 LAB — SODIUM, URINE, RANDOM: SODIUM UR: 79 meq/L

## 2014-03-15 LAB — PROTIME-INR
INR: 1.81 — ABNORMAL HIGH (ref 0.00–1.49)
Prothrombin Time: 21 seconds — ABNORMAL HIGH (ref 11.6–15.2)

## 2014-03-15 LAB — OSMOLALITY: OSMOLALITY: 275 mosm/kg (ref 275–300)

## 2014-03-15 MED ORDER — PROMETHAZINE HCL 25 MG/ML IJ SOLN
12.5000 mg | Freq: Three times a day (TID) | INTRAMUSCULAR | Status: DC | PRN
  Administered 2014-03-17: 12.5 mg via INTRAVENOUS
  Filled 2014-03-15 (×2): qty 1

## 2014-03-15 MED ORDER — WARFARIN SODIUM 6 MG PO TABS
6.0000 mg | ORAL_TABLET | Freq: Once | ORAL | Status: AC
  Administered 2014-03-15: 6 mg via ORAL
  Filled 2014-03-15: qty 1

## 2014-03-15 MED ORDER — FAMOTIDINE 20 MG PO TABS
20.0000 mg | ORAL_TABLET | Freq: Every day | ORAL | Status: DC
  Administered 2014-03-16 – 2014-03-19 (×3): 20 mg via ORAL
  Filled 2014-03-15 (×4): qty 1

## 2014-03-15 MED ORDER — SODIUM CHLORIDE 0.9 % IV SOLN
510.0000 mg | Freq: Once | INTRAVENOUS | Status: AC
  Administered 2014-03-15: 510 mg via INTRAVENOUS
  Filled 2014-03-15: qty 17

## 2014-03-15 MED ORDER — LIDOCAINE HCL 1 % IJ SOLN
INTRAMUSCULAR | Status: AC
  Filled 2014-03-15: qty 20

## 2014-03-15 MED ORDER — CHLORHEXIDINE GLUCONATE 4 % EX LIQD
CUTANEOUS | Status: AC
  Filled 2014-03-15: qty 15

## 2014-03-15 MED ORDER — FUROSEMIDE 10 MG/ML IJ SOLN
160.0000 mg | Freq: Two times a day (BID) | INTRAMUSCULAR | Status: DC
  Filled 2014-03-15: qty 16

## 2014-03-15 NOTE — Progress Notes (Signed)
Patient ID: [name], female   DOB: 1960-10-23, 53 y.o.   MRN: [id number]        Attending progress note    Date of Admission:  03/11/2014     Principal Problem:   Acute on chronic systolic heart failure Active Problems:   Type 2 diabetes mellitus with complication   Anxiety state   GERD   Morbid obesity   Anemia   Essential hypertension   Chronic kidney disease, stage 3   History of CVA (cerebrovascular accident)   History of seizure   Enteritis due to Clostridium difficile   . allopurinol  100 mg Oral Daily  . antiseptic oral rinse  7 mL Mouth Rinse q12n4p  . atorvastatin  20 mg Oral QHS  . carvedilol  3.125 mg Oral BID WC  . chlorhexidine      . chlorhexidine  15 mL Mouth Rinse BID  . ferrous sulfate  325 mg Oral BID  . [START ON 03/16/2014] furosemide  160 mg Intravenous Q12H  . hydrALAZINE  10 mg Oral TID  . insulin aspart  0-5 Units Subcutaneous QHS  . insulin aspart  0-9 Units Subcutaneous TID WC  . isosorbide dinitrate  20 mg Oral TID  . levETIRAcetam  500 mg Oral BID  . lidocaine      . methylphenidate  5 mg Oral Daily  . miconazole   Topical BID  . multivitamin with minerals  1 tablet Oral Daily  . pantoprazole  40 mg Oral Daily  . polyethylene glycol  17 g Oral Daily  . pregabalin  75 mg Oral BID  . sertraline  150 mg Oral Daily  . sodium chloride  3 mL Intravenous Q12H  . vancomycin  125 mg Oral QID  . warfarin  6 mg Oral ONCE-1800  . Warfarin - Pharmacist Dosing Inpatient   Does not apply q1800    Ms. Hineman is improving. Her creatinine is up slightly but she has responded well to diuresis and her repeat chest x-ray showed marked improvement in her congestive heart failure. It appears like she will be able to go without hemodialysis in the near future. Her C. difficile colitis is improving as well. She may be ready for transfer back to her skilled nursing facility soon.  Michel Bickers, MD [facility] [phone] pager   [phone] cell 03/15/2014, 1:18 PM

## 2014-03-15 NOTE — Progress Notes (Signed)
ANTICOAGULATION CONSULT NOTE - Follow Up Consult  Pharmacy Consult for warfarin Indication: h/o CVA  Allergies  Allergen Reactions  . Ace Inhibitors Shortness Of Breath  . Latex Hives  . Lisinopril Other (See Comments)    Per MAR  . Vancomycin Other (See Comments)    Per [facility]    Patient Measurements: Height: 5' (152.4 cm) Weight: 251 lb 12.8 oz (114.216 kg) IBW/kg (Calculated) : 45.5  Vital Signs: Temp: 97.6 F (36.4 C) (10/05 0825) Temp Source: Axillary (10/05 0825) BP: 142/82 mmHg (10/05 0825) Pulse Rate: 99 (10/05 0825)  Labs:  Recent Labs  03/13/14 0507 03/14/14 0308 03/14/14 1249 03/15/14 0438  HGB 9.1* 9.3*  --   --   HCT 29.2* 29.7*  --   --   PLT 153 187  --   --   LABPROT 17.1* 17.9*  --  21.0*  INR 1.39 1.48  --  1.81*  CREATININE 1.86* 2.13* 2.18* 2.21*    Estimated Creatinine Clearance: 34.7 ml/min (by C-G formula based on Cr of 2.21).   Medications:  Scheduled:  . allopurinol  100 mg Oral Daily  . antiseptic oral rinse  7 mL Mouth Rinse q12n4p  . atorvastatin  20 mg Oral QHS  . carvedilol  3.125 mg Oral BID WC  . chlorhexidine  15 mL Mouth Rinse BID  . ferrous sulfate  325 mg Oral BID  . furosemide  80 mg Intravenous Q8H  . hydrALAZINE  10 mg Oral TID  . insulin aspart  0-5 Units Subcutaneous QHS  . insulin aspart  0-9 Units Subcutaneous TID WC  . isosorbide dinitrate  20 mg Oral TID  . levETIRAcetam  500 mg Oral BID  . methylphenidate  5 mg Oral Daily  . miconazole   Topical BID  . multivitamin with minerals  1 tablet Oral Daily  . pantoprazole  40 mg Oral Daily  . polyethylene glycol  17 g Oral Daily  . pregabalin  75 mg Oral BID  . sertraline  150 mg Oral Daily  . sodium chloride  3 mL Intravenous Q12H  . spironolactone  12.5 mg Oral Daily  . vancomycin  125 mg Oral QID  . Warfarin - Pharmacist Dosing Inpatient   Does not apply q1800    Assessment: 53 yo f admitted on 10/1 after chest pain during HD. Patient is on warfarin PTA  at a dose of 6 mg PO daily for history of a CVA. Pharmacy is consulted to dose and adjust warfarin while inpatient. Patient's INR on admission was 1.41.  Patient has received three doses of 10 mg with INR now up to 1.81.    Goal of Therapy:  INR 2-3 Monitor platelets by anticoagulation protocol: Yes   Plan:  Warfarin 6 mg tonight x 1 Daily PT/INR  Hildred Laser, Pharm D 03/15/2014 8:39 AM

## 2014-03-15 NOTE — Progress Notes (Signed)
Medicare Important Message given?  YES (If response is "NO", the following Medicare IM given date fields will be blank) Date Medicare IM given:  03/15/2014 Medicare IM given by:  Sissi Padia  

## 2014-03-15 NOTE — Progress Notes (Signed)
Nutrition Brief Note  Patient identified on the Low Braden Report. Braden is low due to limited activity and mobility. Nutrition score is adequate. No skin issues noted, just a skin tear to back. No weight loss noted overall.  Wt Readings from Last 15 Encounters:  03/15/14 251 lb 12.8 oz (114.216 kg)  08/14/13 208 lb (94.348 kg)  03/18/13 208 lb (94.348 kg)  03/18/13 208 lb (94.348 kg)  08/01/12 220 lb (99.791 kg)  08/01/12 220 lb (99.791 kg)  01/18/12 217 lb (98.431 kg)  01/18/12 217 lb (98.431 kg)  12/07/11 232 lb (105.235 kg)  12/04/11 223 lb (101.152 kg)  10/24/11 230 lb 8.9 oz (104.58 kg)  10/18/11 217 lb (98.431 kg)  10/18/11 217 lb (98.431 kg)  10/11/11 222 lb (100.699 kg)  10/11/11 222 lb (100.699 kg)    Body mass index is 49.18 kg/(m^2). Patient meets criteria for class 3, extreme/morbid obesity based on current BMI.   Current diet order is renal CHO modified, patient is consuming approximately 75% of meals at this time. Labs and medications reviewed.   No nutrition interventions warranted at this time. If nutrition issues arise, please consult RD.   Molli Barrows, RD, LDN, [place] Pager [phone] After Hours Pager [phone]

## 2014-03-15 NOTE — Progress Notes (Addendum)
CSW (Clinical Education officer, museum) received call from Teh[place] with [facility] [phone]) asking for update on pt. CSW provided with available information. Olivia Mackie requesting to be notified as soon as possible if plan is for pt to dc today. CSW paged MD to inquire.  ADDENDUM 11:55am: CSW notified by MD that pt will not dc today. CSW called [facility] and notified of this. CSW updated facility that pt is on PO antibiotics for cdfiff. Facility confirmed this would not be an issue.   [place], [place]

## 2014-03-15 NOTE — Progress Notes (Signed)
S: Voiced no new CO but then vomited initially what looked like pulm secretions but then vomited some food particles O:BP 138/64  Pulse 86  Temp(Src) 98 F (36.7 C) (Oral)  Resp 18  Ht 5' (1.524 m)  Wt 114.216 kg (251 lb 12.8 oz)  BMI 49.18 kg/m2  SpO2 100%  Intake/Output Summary (Last 24 hours) at 03/15/14 0824 Last data filed at 03/15/14 0527  Gross per 24 hour  Intake   1200 ml  Output    750 ml  Net    450 ml   Weight change: 1.633 kg (3 lb 9.6 oz) [id number] and alert CVS:RRR Resp:Decreased BS throughout Abd:+ BS NT ND  + subQ edema abd wall Ext: + edema NEURO:Ox3 no asterixis   . allopurinol  100 mg Oral Daily  . antiseptic oral rinse  7 mL Mouth Rinse q12n4p  . atorvastatin  20 mg Oral QHS  . carvedilol  3.125 mg Oral BID WC  . chlorhexidine  15 mL Mouth Rinse BID  . ferrous sulfate  325 mg Oral BID  . furosemide  80 mg Intravenous Q8H  . hydrALAZINE  10 mg Oral TID  . insulin aspart  0-5 Units Subcutaneous QHS  . insulin aspart  0-9 Units Subcutaneous TID WC  . isosorbide dinitrate  20 mg Oral TID  . levETIRAcetam  500 mg Oral BID  . methylphenidate  5 mg Oral Daily  . miconazole   Topical BID  . multivitamin with minerals  1 tablet Oral Daily  . pantoprazole  40 mg Oral Daily  . polyethylene glycol  17 g Oral Daily  . pregabalin  75 mg Oral BID  . sertraline  150 mg Oral Daily  . sodium chloride  3 mL Intravenous Q12H  . spironolactone  12.5 mg Oral Daily  . vancomycin  125 mg Oral QID  . Warfarin - Pharmacist Dosing Inpatient   Does not apply q1800   Dg Chest Port 1 View  03/13/2014   CLINICAL DATA:  Evaluate pulmonary edema after diuresis, followup congestive heart failure  EXAM: PORTABLE CHEST - 1 VIEW  COMPARISON:  03/11/2014  FINDINGS: Very limited inspiratory effect in the patient's chin obscures the upper lung zones. No change right dual lumen central line. No change in cardiac enlargement.  Left lung appears clear. There remains mild hazy right  perihilar opacity, although right-sided aeration is notably improved. Small right pleural effusions suspected.  IMPRESSION: Limited study with poor inspiratory effect. Significantly improved bilateral aeration with some mild residual right perihilar opacity suggesting mild lingering edema.   Electronically Signed   By: Skipper Cliche M.D.   On: 03/13/2014 21:19   BMET    Component Value Date/Time   NA 129* 03/15/2014 0438   K 4.2 03/15/2014 0438   CL 93* 03/15/2014 0438   CO2 23 03/15/2014 0438   GLUCOSE 115* 03/15/2014 0438   BUN 25* 03/15/2014 0438   CREATININE 2.21* 03/15/2014 0438   CALCIUM 9.0 03/15/2014 0438   GFRNONAA 25* 03/15/2014 0438   GFRAA 28* 03/15/2014 0438   CBC    Component Value Date/Time   WBC 4.5 03/14/2014 0308   RBC 3.22* 03/14/2014 0308   RBC 3.22* 03/14/2014 0308   HGB 9.3* 03/14/2014 0308   HCT 29.7* 03/14/2014 0308   PLT 187 03/14/2014 0308   MCV 92.2 03/14/2014 0308   MCH 28.9 03/14/2014 0308   MCHC 31.3 03/14/2014 0308   RDW 19.2* 03/14/2014 0308   LYMPHSABS 1.0 03/11/2014 1145  MONOABS 0.7 03/11/2014 1145   EOSABS 0.2 03/11/2014 1145   BASOSABS 0.0 03/11/2014 1145     Assessment: 1. CKD 4.  Suspect she had some acute on CKD requiring HD for volume control.  Time will tell if will be able to manage off HD for long 2. DM 3. Anemia and Fe def 4. HTN 5. Hx CVA 6. C Diff Plan: 1. Given her underlying CKD, I think the risk of spironolactone outweighs the benefits 2. IV Iron 3. Dc spironolactone 4. Renal diet 5. Increase lasix to 160mg  BID  Louretta Tantillo T

## 2014-03-15 NOTE — Procedures (Signed)
Procedure:  Removal of tunneled HD catheter Findings:  Tunneled right sided HD cathter removed in entirety.  No complications.

## 2014-03-15 NOTE — Progress Notes (Signed)
RT spoke with patient's RN about wearing the CPAP. The patient doesn't want to give up her ice and water cup at this time to be placed on CPAP.  RT advised RN to give RT a call if the patient decides to wear the CPAP. RT will continue to monitor.

## 2014-03-15 NOTE — Progress Notes (Signed)
Subjective: [name] feels "fine" this morning. She has some lower abdominal pain that she describes as "sharp", but has experienced no chest pain. She says that it has been easier to breath. She remains anxious.   Objective: Vital signs in last 24 hours: Filed Vitals:   03/14/14 1500 03/14/14 2126 03/14/14 2224 03/15/14 0526  BP: 128/73 99/36 138/51 138/64  Pulse: 82 84  86  Temp: 98.9 F (37.2 C) 97.6 F (36.4 C)  98 F (36.7 C)  TempSrc: Oral Oral  Oral  Resp: 16 16  18   Height:      Weight:    251 lb 12.8 oz (114.216 kg)  SpO2: 99% 100%  100%   Weight change: 3 lb 9.6 oz (1.633 kg)  Intake/Output Summary (Last 24 hours) at 03/15/14 [id number] Last data filed at 03/15/14 [id number]  Gross per 24 hour  Intake   1200 ml  Output    750 ml  Net    450 ml   Physical Exam: Appearance: morbidly obese woman lying in bed with eyes closed, difficult to arouse, but opens eyes when asked HEENT: AT/[place] Heart: RRR, no m/r/g Lungs: anterior - diminished breath sounds throughout but difficult exam due to body habitus Abdomen: obese abdomen, + BS present, minimal periumbilical TTP, no rebound/guarding/ridigity, abdomen is soft GU: foley in place with ~ 50cc of clear yellow urine; RN reports 500 mL overnight before bag was changed Extremities: right BKA and left AKA, well-healed. Right arm atrophied in comparison to left arm, right arm has resting tremor Neurologic:A&Ox3, appropriate verbal responses  Lab Results: Basic Metabolic Panel:  Recent Labs Lab 03/12/14 0500 03/13/14 0507  03/14/14 1249 03/15/14 0438  NA 138 137  < > 131* 129*  K 3.5* 3.9  < > 3.9 4.2  CL 100 100  < > 95* 93*  CO2 28 23  < > 25 23  GLUCOSE 73 74  < > 129* 115*  BUN 16 19  < > 24* 25*  CREATININE 1.62* 1.86*  < > 2.18* 2.21*  CALCIUM 9.6 9.2  < > 9.2 9.0  PHOS  --  2.2*  --   --   --   < > = values in this interval not displayed. CBC:  Recent Labs Lab 03/11/14 1145  03/13/14 0507 03/14/14 0308  WBC  5.4  < > 4.9 4.5  NEUTROABS 3.4  --   --   --   HGB 10.1*  < > 9.1* 9.3*  HCT 32.3*  < > 29.2* 29.7*  MCV 91.0  < > 90.4 92.2  PLT 98*  < > 153 187  < > = values in this interval not displayed.  Coagulation:  Recent Labs Lab 03/12/14 0500 03/13/14 0507 03/14/14 0308 03/15/14 0438  LABPROT 17.3* 17.1* 17.9* 21.0*  INR 1.41 1.39 1.48 1.81*   Medications: I have reviewed the patient's current medications. Scheduled Meds: . allopurinol  100 mg Oral Daily  . antiseptic oral rinse  7 mL Mouth Rinse q12n4p  . atorvastatin  20 mg Oral QHS  . carvedilol  3.125 mg Oral BID WC  . chlorhexidine  15 mL Mouth Rinse BID  . ferrous sulfate  325 mg Oral BID  . furosemide  80 mg Intravenous Q8H  . hydrALAZINE  10 mg Oral TID  . insulin aspart  0-5 Units Subcutaneous QHS  . insulin aspart  0-9 Units Subcutaneous TID WC  . isosorbide dinitrate  20 mg Oral TID  . levETIRAcetam  500 mg Oral BID  . methylphenidate  5 mg Oral Daily  . miconazole   Topical BID  . multivitamin with minerals  1 tablet Oral Daily  . pantoprazole  40 mg Oral Daily  . polyethylene glycol  17 g Oral Daily  . pregabalin  75 mg Oral BID  . sertraline  150 mg Oral Daily  . sodium chloride  3 mL Intravenous Q12H  . spironolactone  12.5 mg Oral Daily  . vancomycin  125 mg Oral QID  . Warfarin - Pharmacist Dosing Inpatient   Does not apply q1800   Continuous Infusions:  PRN Meds:.LORazepam, traMADol  Assessment/Plan:  [name] is a 53 yo woman with multiple co-morbidities who presented from dialysis with sharp, burning chest pain on 03/11/14. She has had no recurrence of this pain and had a negative i-stat troponin and unchanged EKG; however her initial XR showed extensive edema diffusely and her proBNP was elevated to [address]. Interestingly, despite the recent initiation of her HD, it does not appear that she has ESRD, and has no indication for continued dialysis. She has, however, been found to be infected with c  difficile. Her current management is therefore directed at determining her fluid status on lasix and managing her antibiotics.  Acute on Chronic Systolic Heart Failure: Given her initial CXR and proBNP (52,920 on admission, up from 1850 two years ago), she is likely in volume overload due to acute exacerbation of heart failure. Echo on this admission with EF 35-40%. IV Lasix has been given; I&Os have not been accurately documented despite foley in place, but nursing was made aware and accurately measured only 500 mL urine output overnight. Weight has been recorded as ~4 pound increase. Repeat CXR 10/3 showed improvement in bilateral aeration with some mild residual right perihilar opacity (s/o mild lingering edema). Difficult to get a good exam for volume status (due to body habitus on lung exam and BKA/AKA for assessment of LE edema). Subjectively, patient is breathing more easily. Chest pain (at outside HD center) has not recurred and was described as stabbing and burning - perhaps related to untreated GERD (she has a GERD hx).  - Continue lasix 80 mg IV TID daily for aggressive diuresis; consider increasing dose today - Continue home spironolactone, hydralazine and carvedilol  - Strict I's & O's - Continue nitro TID - Continue home protonix - Tele monitoring  Abdominal Pain:  She reports intermittent indigestion and some lower abdominal pain. Her abdominal exam is reassuring (BS+, soft, no peritoneal signs) and she is eating without nausea/vomiting. - Will monitor for change in abdominal exam; can consider abdominal xray if signs and symptoms change - Continue protonix for reflux - Continue tramadol for pain (home medication) - Continue treatment for Cdiff as below  Clostridium Difficile Infection:  The patient complained of fever prior to admission (never recorded) and reported diarrhea. She feels the amount of diarrhea has not changed. She has not had fever or leukocytosis this admission. -  Continue po vancomycin 125 mg four times per day; this should be continued for 10 to 14 days. This may be escalated to 500 mg at the same frequency if she does not show signs of improvement - Enteric precautions - CBC/fever monitoring  CKD3: Nephrology feels the patient dose not need continued HD given relatively good GFR. Appreciate renal input. Cr 1.34-->2.13-->2.21 this admission with aggressive diuresis. She likely needs continued diuresis and we will need strict I&Os to monitor progress. - Continue IV Lasix 80mg   TID; consider increasing dose today - Monitoring I/O and renal function - Appreciate nephrology arranging removal of TDC while patient is here  Hyponatremia:  Na 129. She is AAO x3 and not confused or nauseous. Low Na is possibly due to Lasix or volume overload. - Urine Na, urine Cr, serum osms pending; will calculate FeNa - continue to diurese and monitor renal function  GERD: Has diagnosis of GERD, but does not take PPI.  - Started protonix; consider sending her home on this medication   DMII: Stable. Most recent A1c in our records is 5.3% (but has been as high as 13.9%) with peripheral neuropathy and complications that led to her BKA and AKA. On 5 novolog with meals at home.  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 Patient is on coumadin, atorvastatin.   Atrial Fibrillation: Documented in [facility] records.  - Continue coumadin per pharmacy; patient also has a history of DVT   Morbid Obesity: bed bound at SNF  - Appreciate PT visit and OT attempted visit; recommend return to SNF  History of Seizure:  - Continue home keppra   Sleep Apnea:  - Continue home bipap at night   Depression and Anxiety: Has  been using PRN ativan twice a day - Continue home ativan and sertraline   DVT Ppx:  - Patient is on coumadin  Diet: HH / Carb Mod   Dispo: Disposition is deferred at this time, awaiting improvement of current medical problems.  Anticipated discharge in approximately 2-3 day(s).   The patient does have a current PCP Rogene Houston, MD) and does not need an [facility] hospital follow-up appointment after discharge.  The patient does have transportation limitations that hinder transportation to clinic appointments.  .Services Needed at time of discharge: Y = Yes, Blank = No PT:   OT:   RN:   Equipment:   Other:     LOS: 4 days   Drucilla Schmidt, MD 03/15/2014, 7:12 AM

## 2014-03-16 DIAGNOSIS — G4730 Sleep apnea, unspecified: Secondary | ICD-10-CM

## 2014-03-16 DIAGNOSIS — E871 Hypo-osmolality and hyponatremia: Secondary | ICD-10-CM

## 2014-03-16 DIAGNOSIS — B9689 Other specified bacterial agents as the cause of diseases classified elsewhere: Secondary | ICD-10-CM

## 2014-03-16 DIAGNOSIS — N183 Chronic kidney disease, stage 3 (moderate): Secondary | ICD-10-CM

## 2014-03-16 LAB — GLUCOSE, CAPILLARY
GLUCOSE-CAPILLARY: 140 mg/dL — AB (ref 70–99)
GLUCOSE-CAPILLARY: 118 mg/dL — AB (ref 70–99)
Glucose-Capillary: 114 mg/dL — ABNORMAL HIGH (ref 70–99)
Glucose-Capillary: 143 mg/dL — ABNORMAL HIGH (ref 70–99)
Glucose-Capillary: 139 mg/dL — ABNORMAL HIGH (ref 70–99)

## 2014-03-16 LAB — RENAL FUNCTION PANEL
ANION GAP: 16 — AB (ref 5–15)
Albumin: 2.8 g/dL — ABNORMAL LOW (ref 3.5–5.2)
BUN: 29 mg/dL — AB (ref 6–23)
CHLORIDE: 93 meq/L — AB (ref 96–112)
CO2: 23 mEq/L (ref 19–32)
Calcium: 9.1 mg/dL (ref 8.4–10.5)
Creatinine, Ser: 2.33 mg/dL — ABNORMAL HIGH (ref 0.50–1.10)
GFR calc Af Amer: 27 mL/min — ABNORMAL LOW (ref >90)
GFR calc non Af Amer: 23 mL/min — ABNORMAL LOW (ref >90)
GLUCOSE: 114 mg/dL — AB (ref 70–99)
Phosphorus: 2.7 mg/dL (ref 2.3–4.6)
Potassium: 4 mEq/L (ref 3.7–5.3)
Sodium: 132 mEq/L — ABNORMAL LOW (ref 137–147)

## 2014-03-16 LAB — PROTIME-INR
INR: 2.28 — AB (ref 0.00–1.49)
Prothrombin Time: 25.1 seconds — ABNORMAL HIGH (ref 11.6–15.2)

## 2014-03-16 MED ORDER — WARFARIN SODIUM 6 MG PO TABS
6.0000 mg | ORAL_TABLET | Freq: Once | ORAL | Status: AC
  Administered 2014-03-16: 6 mg via ORAL
  Filled 2014-03-16: qty 1

## 2014-03-16 MED ORDER — FUROSEMIDE 10 MG/ML IJ SOLN
160.0000 mg | Freq: Two times a day (BID) | INTRAMUSCULAR | Status: DC
  Administered 2014-03-16 (×2): 160 mg via INTRAVENOUS
  Filled 2014-03-16 (×3): qty 16

## 2014-03-16 NOTE — Progress Notes (Signed)
ANTICOAGULATION CONSULT NOTE - Follow Up Consult  Pharmacy Consult for warfarin Indication: h/o CVA  Allergies  Allergen Reactions  . Ace Inhibitors Shortness Of Breath  . Latex Hives  . Lisinopril Other (See Comments)    Per MAR  . Vancomycin Other (See Comments)    Per [facility]    Patient Measurements: Height: 5' (152.4 cm) Weight: 267 lb 14.4 oz (121.519 kg) IBW/kg (Calculated) : 45.5  Vital Signs: Temp: 98 F (36.7 C) (10/06 0805) Temp Source: Oral (10/06 0805) BP: 105/78 mmHg (10/06 0805) Pulse Rate: 87 (10/06 0805)  Labs:  Recent Labs  03/14/14 0308 03/14/14 1249 03/15/14 0438 03/16/14 0504  HGB 9.3*  --   --   --   HCT 29.7*  --   --   --   PLT 187  --   --   --   LABPROT 17.9*  --  21.0* 25.1*  INR 1.48  --  1.81* 2.28*  CREATININE 2.13* 2.18* 2.21* 2.33*    Estimated Creatinine Clearance: 34.2 ml/min (by C-G formula based on Cr of 2.33).   Medications:  Scheduled:  . allopurinol  100 mg Oral Daily  . antiseptic oral rinse  7 mL Mouth Rinse q12n4p  . atorvastatin  20 mg Oral QHS  . carvedilol  3.125 mg Oral BID WC  . chlorhexidine  15 mL Mouth Rinse BID  . famotidine  20 mg Oral Daily  . ferrous sulfate  325 mg Oral BID  . furosemide  160 mg Intravenous Q12H  . hydrALAZINE  10 mg Oral TID  . insulin aspart  0-5 Units Subcutaneous QHS  . insulin aspart  0-9 Units Subcutaneous TID WC  . isosorbide dinitrate  20 mg Oral TID  . levETIRAcetam  500 mg Oral BID  . methylphenidate  5 mg Oral Daily  . miconazole   Topical BID  . multivitamin with minerals  1 tablet Oral Daily  . polyethylene glycol  17 g Oral Daily  . pregabalin  75 mg Oral BID  . sertraline  150 mg Oral Daily  . sodium chloride  3 mL Intravenous Q12H  . vancomycin  125 mg Oral QID  . Warfarin - Pharmacist Dosing Inpatient   Does not apply q1800    Assessment: 53 yo f admitted on 10/1 after chest pain during HD. Patient is on warfarin PTA at a dose of 6 mg PO daily for history of a  CVA. Pharmacy is consulted to dose and adjust warfarin while inpatient. Patient's INR on admission was 1.41.  Patient has received three doses of 10 mg with INR now up to 2.28 (home dose of 6mg  restarted 10/5).    Goal of Therapy:  INR 2-3 Monitor platelets by anticoagulation protocol: Yes   Plan:  Warfarin 6 mg tonight x 1 Daily PT/INR  Hildred Laser, Pharm D 03/16/2014 10:10 AM

## 2014-03-16 NOTE — Progress Notes (Signed)
Dr. Sherrine Maples notified of creatinine of 2.33. Output of 100 ml in 8 hours. No new orders at this time.

## 2014-03-16 NOTE — Progress Notes (Signed)
S: No further vomiting.  No Abd pain.  Says she ate dinner last night O:BP 125/49  Pulse 89  Temp(Src) 97.5 F (36.4 C) (Oral)  Resp 18  Ht 5' (1.524 m)  Wt 121.519 kg (267 lb 14.4 oz)  BMI 52.32 kg/m2  SpO2 95%  Intake/Output Summary (Last 24 hours) at 03/16/14 0733 Last data filed at 03/16/14 0600  Gross per 24 hour  Intake   1085 ml  Output    815 ml  Net    270 ml   Weight change: 7.303 kg (16 lb 1.6 oz) [id number] and alert CVS:RRR Resp:Decreased BS throughout Abd:+ BS NT ND  + subQ edema abd wall Ext: + edema NEURO:Ox3 no asterixis   . allopurinol  100 mg Oral Daily  . antiseptic oral rinse  7 mL Mouth Rinse q12n4p  . atorvastatin  20 mg Oral QHS  . carvedilol  3.125 mg Oral BID WC  . chlorhexidine  15 mL Mouth Rinse BID  . famotidine  20 mg Oral Daily  . ferrous sulfate  325 mg Oral BID  . furosemide  160 mg Intravenous Q12H  . hydrALAZINE  10 mg Oral TID  . insulin aspart  0-5 Units Subcutaneous QHS  . insulin aspart  0-9 Units Subcutaneous TID WC  . isosorbide dinitrate  20 mg Oral TID  . levETIRAcetam  500 mg Oral BID  . methylphenidate  5 mg Oral Daily  . miconazole   Topical BID  . multivitamin with minerals  1 tablet Oral Daily  . polyethylene glycol  17 g Oral Daily  . pregabalin  75 mg Oral BID  . sertraline  150 mg Oral Daily  . sodium chloride  3 mL Intravenous Q12H  . vancomycin  125 mg Oral QID  . Warfarin - Pharmacist Dosing Inpatient   Does not apply [id number]   Ir Removal Tun Cv Cath W/o Fl  03/15/2014   CLINICAL DATA:  Renal failure with previous placement of tunneled hemodialysis catheter. The patient has recovered renal function and requires removal of the tunneled dialysis catheter.  EXAM: REMOVAL OF TUNNELED CENTRAL VENOUS CATHETER  PROCEDURE: The right chest dialysis catheter site was prepped with chlorhexidine. A sterile gown and gloves were worn during the procedure. Local anesthesia was provided with 1% lidocaine.  Utilizing manual  traction, the subcutaneous cuff of the dialysis catheter was freed. The catheter was then successfully removed in its entirety. A sterile dressing was applied over the catheter exit site.  IMPRESSION: Removal of tunneled dialysis catheter utilizing sharp and blunt dissection. The procedure was uncomplicated.   Electronically Signed   By: Aletta Edouard M.D.   On: 03/15/2014 11:50   BMET    Component Value Date/Time   NA 132* 03/16/2014 0504   K 4.0 03/16/2014 0504   CL 93* 03/16/2014 0504   CO2 23 03/16/2014 0504   GLUCOSE 114* 03/16/2014 0504   BUN 29* 03/16/2014 0504   CREATININE 2.33* 03/16/2014 0504   CALCIUM 9.1 03/16/2014 0504   GFRNONAA 23* 03/16/2014 0504   GFRAA 27* 03/16/2014 0504   CBC    Component Value Date/Time   WBC 4.5 03/14/2014 0308   RBC 3.22* 03/14/2014 0308   RBC 3.22* 03/14/2014 0308   HGB 9.3* 03/14/2014 0308   HCT 29.7* 03/14/2014 0308   PLT 187 03/14/2014 0308   MCV 92.2 03/14/2014 0308   MCH 28.9 03/14/2014 0308   MCHC 31.3 03/14/2014 0308   RDW 19.2* 03/14/2014 0308   LYMPHSABS  1.0 03/11/2014 1145   MONOABS 0.7 03/11/2014 1145   EOSABS 0.2 03/11/2014 1145   BASOSABS 0.0 03/11/2014 1145     Assessment: 1. CKD 4.  Suspect she had some acute on CKD requiring HD for volume control.  Time will tell if will be able to manage off HD for long.  UO marginal 2. DM 3. Anemia and Fe def, received 510mg  feraheme 4. HTN 5. Hx CVA 6. C Diff Plan: 1. Will see how she does today with higher dose of lasix 2. Recheck Scr in AM  Liron Eissler T

## 2014-03-16 NOTE — Progress Notes (Signed)
Subjective: [name] continues to have some phantom limb pain which we are treating with her home gabapentin and PRN toradol.  She says that she is still having diarrhea but the diarrhea has decreased in frequency.  Other than this she has no complains and is ready to go back to her SNF. Tele event:Notified that patient was having V.tach according to monitor.   Went to check on patient with intern, determined that patient had been tapping on the monitor (patient has a resting tremor in her right hand).  Once the monitor was removed from the area where she was resting her hand the telemetry showed a return to her baseline rhythm.    Objective: Vital signs in last 24 hours: Filed Vitals:   03/15/14 2115 03/15/14 2240 03/16/14 0523 03/16/14 0805  BP: 151/92  125/49 105/78  Pulse: 86 88 89 87  Temp: 98.2 F (36.8 C)  97.5 F (36.4 C) 98 F (36.7 C)  TempSrc: Oral  Oral Oral  Resp: $Remo'18 18 18 17  'YJTYZ$ Height:      Weight:   121.519 kg (267 lb 14.4 oz)   SpO2: 100% 100% 95% 100%   Weight change: 7.303 kg (16 lb 1.6 oz)  Intake/Output Summary (Last 24 hours) at 03/16/14 1139 Last data filed at 03/16/14 0810  Gross per 24 hour  Intake   1000 ml  Output    815 ml  Net    185 ml   Physical Exam  Gen: NAD, morbidly obese, resting in bed with her eyes closed while performing exam and while speaking to [place].  Nasal canula was out of place resting above the pt nose, she felt that she was able to breath well without it.  HEENT: AT/[place], transdermal catheter is now removed, clean dry dressing now covering site  Heart: RRR, no murmur, rubs or gallops  Lungs: diminished breath sounds anteriorly, examination limited by patients body habitus (patient is unable to hold herself up right)  Abdomen: obese abdomen, soft, slightly tender BS+ but diminished  GU: pt has foley in place with ~300 cc of clear yellow urine  Extremities: right BKA, left AKA, Right arm smaller in size than right , Right arm has resting  tremor  Neuro somnolent but A&O x 3   Lab Results: Results for orders placed during the hospital encounter of 03/11/14 (from the past 24 hour(s))  GLUCOSE, CAPILLARY     Status: Abnormal   Collection Time    03/15/14  5:12 PM      Result Value Ref Range   Glucose-Capillary 122 (*) 70 - 99 mg/dL   Comment 1 Notify RN    GLUCOSE, CAPILLARY     Status: Abnormal   Collection Time    03/15/14  9:13 PM      Result Value Ref Range   Glucose-Capillary 129 (*) 70 - 99 mg/dL   Comment 1 Notify RN    RENAL FUNCTION PANEL     Status: Abnormal   Collection Time    03/16/14  5:04 AM      Result Value Ref Range   Sodium 132 (*) 137 - 147 mEq/L   Potassium 4.0  3.7 - 5.3 mEq/L   Chloride 93 (*) 96 - 112 mEq/L   CO2 23  19 - 32 mEq/L   Glucose, Bld 114 (*) 70 - 99 mg/dL   BUN 29 (*) 6 - 23 mg/dL   Creatinine, Ser 2.33 (*) 0.50 - 1.10 mg/dL   Calcium 9.1  8.4 - 10.5 mg/dL   Phosphorus 2.7  2.3 - 4.6 mg/dL   Albumin 2.8 (*) 3.5 - 5.2 g/dL   GFR calc non Af Amer 23 (*) >90 mL/min   GFR calc Af Amer 27 (*) >90 mL/min   Anion gap 16 (*) 5 - 15  PROTIME-INR     Status: Abnormal   Collection Time    03/16/14  5:04 AM      Result Value Ref Range   Prothrombin Time 25.1 (*) 11.6 - 15.2 seconds   INR 2.28 (*) 0.00 - 1.49  GLUCOSE, CAPILLARY     Status: Abnormal   Collection Time    03/16/14  7:42 AM      Result Value Ref Range   Glucose-Capillary 114 (*) 70 - 99 mg/dL  GLUCOSE, CAPILLARY     Status: Abnormal   Collection Time    03/16/14 11:50 AM      Result Value Ref Range   Glucose-Capillary 140 (*) 70 - 99 mg/dL   Micro Results: Recent Results (from the past 240 hour(s))  URINE CULTURE     Status: None   Collection Time    03/11/14 12:00 PM      Result Value Ref Range Status   Specimen Description URINE, CATHETERIZED   Final   Special Requests NONE   Final   Culture  Setup Time     Final   Value: 03/11/2014 12:42     Performed at [place]     Final    Value: >=100,000 COLONIES/ML     Performed at [facility]   Culture     Final   Value: Multiple bacterial morphotypes present, none predominant. Suggest appropriate recollection if clinically indicated.     Performed at [facility]   Report Status 03/12/2014 FINAL   Final  CLOSTRIDIUM DIFFICILE BY PCR     Status: Abnormal   Collection Time    03/12/14 11:35 AM      Result Value Ref Range Status   C difficile by pcr POSITIVE (*) NEGATIVE Final   Comment: CRITICAL RESULT CALLED TO, READ BACK BY AND VERIFIED WITH:     Sheppard Evens RN 12:50 03/12/14 ([email or web address])   Studies/Results: Ir Removal Tun Cv Cath W/o Fl  03/15/2014   CLINICAL DATA:  Renal failure with previous placement of tunneled hemodialysis catheter. The patient has recovered renal function and requires removal of the tunneled dialysis catheter.  EXAM: REMOVAL OF TUNNELED CENTRAL VENOUS CATHETER  PROCEDURE: The right chest dialysis catheter site was prepped with chlorhexidine. A sterile gown and gloves were worn during the procedure. Local anesthesia was provided with 1% lidocaine.  Utilizing manual traction, the subcutaneous cuff of the dialysis catheter was freed. The catheter was then successfully removed in its entirety. A sterile dressing was applied over the catheter exit site.  IMPRESSION: Removal of tunneled dialysis catheter utilizing sharp and blunt dissection. The procedure was uncomplicated.   Electronically Signed   By: Aletta Edouard M.D.   On: 03/15/2014 11:50   Medications: I have reviewed the patient's current medications. Scheduled Meds: . allopurinol  100 mg Oral Daily  . antiseptic oral rinse  7 mL Mouth Rinse q12n4p  . atorvastatin  20 mg Oral QHS  . carvedilol  3.125 mg Oral BID WC  . chlorhexidine  15 mL Mouth Rinse BID  . famotidine  20 mg Oral Daily  . ferrous sulfate  325 mg Oral BID  .  furosemide  160 mg Intravenous Q12H  . hydrALAZINE  10 mg Oral TID  . insulin aspart  0-5 Units  Subcutaneous QHS  . insulin aspart  0-9 Units Subcutaneous TID WC  . isosorbide dinitrate  20 mg Oral TID  . levETIRAcetam  500 mg Oral BID  . methylphenidate  5 mg Oral Daily  . miconazole   Topical BID  . multivitamin with minerals  1 tablet Oral Daily  . polyethylene glycol  17 g Oral Daily  . pregabalin  75 mg Oral BID  . sertraline  150 mg Oral Daily  . sodium chloride  3 mL Intravenous Q12H  . vancomycin  125 mg Oral QID  . warfarin  6 mg Oral ONCE-1800  . Warfarin - Pharmacist Dosing Inpatient   Does not apply q1800   Continuous Infusions:  PRN Meds:.LORazepam, promethazine, traMADol Assessment/Plan: Principal Problem:   Acute on chronic systolic heart failure Active Problems:   Type 2 diabetes mellitus with complication   Anxiety state   GERD   Morbid obesity   Anemia   Essential hypertension   Chronic kidney disease, stage 3   History of CVA (cerebrovascular accident)   History of seizure   Enteritis due to Clostridium difficile  [name] is a 53 year old woman with multiple co-morbidities who presented after having sharp, burning chest pain during a dialysis treatment on 10/1.  Since the episode in the dialysis center there patient has not had a recurrence of this pain.  Also there have not been any changes in her EKG and her troponin has remained negative.  Her initial CXR on admission showed diffuse edema.  With aggressive diuresis this has been shown to be resolving on a subsequent CXR performed on 10/3.  Her urine output has not been significant.  Per nephrology we increased her IV lasix yesterday from 80 mg TID to 160 mg BID, we have reiterated to nursing that she is to be on strict Is & Os so that we can better characterize her urine output.  On her second day of admission she was found to be C.Diff PCR positive so she is currently completing a course of oral vancomycin.    Acute on Chronic systolic heart failure: Patients had CXR on 10/3 which showed improvement  from day of admission 10/01. She was likely volume overloaded secondary to an acute exacerbation of her heart failure.  ECHO also showed that her EF is 35-40% instead of 20% as was suggested from records obtained from [facility].  IV Lasix was started to address her fluid overload, and strict  Is& Os had been requested multiple times from nursing staff but had not been recorded accurately which makes it difficult to determine how much she has actually been diuresing.  An accurate assessment is also difficult to determine based on exam due to the patients body habitus (she is morbidly obese with a large pannus).  Can consider the patients chest pain at HD center to be resolved as it as not recurred ; it was most likely due to untreated GERD (pt has history of GERD but was not on medication) or volume overload as she is now breathing easier.   - Increase IV Lasix 80 mg TID to 160 mg q12H per nephrology recommendations  - Continue home hydralazine and carvedilol; stop spironolactone per nephrology recommendations  - Strict Is & Os  - Discontinue protonix (pt at increased risk for C.Diff relapse as explained below), start Pepcid 20 mg  PO today   Clostridium difficile infection/Epigastric pain: Patient noted loosening of her stools on 10/2 and was C.Diff PCR positive on 10/02. She is high risk for C.Diff given her decreased kidney function.  Risk for recurrence based on literature is also increased due to the patient's race (African American), multiple co-morbidies and age.  We also started the patient on a PPI 1 day prior to the C.diff diagnosis.  Prolonged PPI use is associated with an increase risk for developing a C.diff infection.  Studies have also shown that PPI and H2 blocker administration started during same hospital course as C.diff diagnosis or started after clearance of infection did not increase the probably of a C.diff recurrence with in the first 15-90 days post admission.  But we switched  her to an H2 blocker due to her other risk factors that place her at an increased risk for relapsing C. Diff infection.   - Continue vancomycin po 125 mg four times per day for 10-14 days  - remain on enteric precautions  - CBC/ fever monitoring  - consulted SW, no complications exist for going back to SNF on oral vanc   CKD3: Patient was seen by nephrology who determined that she was not ESRD as previously thought. Also suggested stopping HD, cathter was removed.  They also recommended stopping her sprinoloactone and going up on her IV Lasix dose. Patients creatinine has been trending up 1.34 to 2.13 to 2.21 to 2.33 while been aggressively dieresised, patient's baseline creatinine is ~2.  - Continue IV Lasix 160 mg BID - continue monitoring renal function and strict I's & O's   GERD: Pt has hx of GERD but was not taking home med for this, She reports GERD- like symptoms here in hospital  - Discontinue protonix - Started pepcid today   Hyponatremia: Patient was found to be hyponatremic  To 129 and 131 on two subsequent CBCs collected 10/4--> 129 10/5--> 133 today.  No change in the patients mental status, still A&O x 3 also has not been confused.  Her hyponatremia is likely due to diuresis or possibly volume overload  - collect B-Met in AM to continue to monitor renal function  - continue to diurese    Iron def - Per nephrology started patient on 510 mg feraheme  DMII/ peripheral neuropathy: No change A1c 5.3% patient has BKA and AKA. DMII is controled with insulin  - Continue home lyrica for neuropathy and phantom limb pain  - continue ISS (sensitive renal), sliding scale at bed time   HTN  - continue home hydralazine and carvedilol   Hx of Seizure  - continue home keppra   Sleep Apnea  - continue bipap at night   Depression and anxiety:  - continue Ativan and sertraline   Morbid Obesity: patient is bed bound and does not move much  - Consult to PT   DVT Ppx:  - patient is  on coumadin - daily PT-INR labs   This is a Careers information officer Note.  The care of the patient was discussed with Dr. Sherrine Maples and the assessment and plan formulated with their assistance.  Please see their attached note for official documentation of the daily encounter.   LOS: 5 days   Linton Ham, Med Student 03/16/2014, 11:39 AM

## 2014-03-16 NOTE — Progress Notes (Signed)
UR Completed Duriel Deery Graves-Bigelow, RN,BSN [phone]  

## 2014-03-16 NOTE — Progress Notes (Addendum)
Subjective: Ms. Moles feels "better" this morning; she states that she is actually better compared to her baseline at her SNF. She is still experiencing diarrhea, but her abdominal pain has decreased. She is no longer experiencing back pain and feels that she is breathing more easily.  Tele event: patient had an episode of vtach; examined by MS3 and me at that time and patient found to be tapping her hand on her monitor (she has a resting tremor). Resolved after hand was removed.  Objective: Vital signs in last 24 hours: Filed Vitals:   03/15/14 2115 03/15/14 2240 03/16/14 0523 03/16/14 0805  BP: 151/92  125/49 105/78  Pulse: 86 88 89 87  Temp: 98.2 F (36.8 C)  97.5 F (36.4 C) 98 F (36.7 C)  TempSrc: Oral  Oral Oral  Resp: 18 18 18 17   Height:      Weight:   267 lb 14.4 oz (121.519 kg)   SpO2: 100% 100% 95% 100%   Weight change: 16 lb 1.6 oz (7.303 kg)  Intake/Output Summary (Last 24 hours) at 03/16/14 1104 Last data filed at 03/16/14 0810  Gross per 24 hour  Intake   1000 ml  Output    815 ml  Net    185 ml   Physical Exam: Appearance: morbidly obese woman lying in bed with eyes closed, quite conversational today. [place] resting above nose (not in place) HEENT: AT/[place], TDC is now removed Heart: RRR, no m/r/g Lungs: anterior exam: diminished breath sounds throughout but difficult exam due to body habitus Abdomen: obese abdomen, + BS present, minimal periumbilical TTP, no rebound/guarding/ridigity, abdomen is soft GU: foley in place with ~ 200cc of clear yellow urine; RN reports 300 mL overnight before bag was changed Extremities: right BKA and left AKA, well-healed. Right arm atrophied in comparison to left arm, right arm has resting tremor Neurologic:A&Ox3, appropriate verbal responses  Lab Results: Basic Metabolic Panel:  Recent Labs Lab 03/13/14 0507  03/15/14 0438 03/16/14 0504  NA 137  < > 129* 132*  K 3.9  < > 4.2 4.0  CL 100  < > 93* 93*  CO2 23  < > 23 23   GLUCOSE 74  < > 115* 114*  BUN 19  < > 25* 29*  CREATININE 1.86*  < > 2.21* 2.33*  CALCIUM 9.2  < > 9.0 9.1  PHOS 2.2*  --   --  2.7  < > = values in this interval not displayed. CBC:  Recent Labs Lab 03/11/14 1145  03/13/14 0507 03/14/14 0308  WBC 5.4  < > 4.9 4.5  NEUTROABS 3.4  --   --   --   HGB 10.1*  < > 9.1* 9.3*  HCT 32.3*  < > 29.2* 29.7*  MCV 91.0  < > 90.4 92.2  PLT 98*  < > 153 187  < > = values in this interval not displayed.  Coagulation:  Recent Labs Lab 03/13/14 0507 03/14/14 0308 03/15/14 0438 03/16/14 0504  LABPROT 17.1* 17.9* 21.0* 25.1*  INR 1.39 1.48 1.81* 2.28*   Medications: I have reviewed the patient's current medications. Scheduled Meds: . allopurinol  100 mg Oral Daily  . antiseptic oral rinse  7 mL Mouth Rinse q12n4p  . atorvastatin  20 mg Oral QHS  . carvedilol  3.125 mg Oral BID WC  . chlorhexidine  15 mL Mouth Rinse BID  . famotidine  20 mg Oral Daily  . ferrous sulfate  325 mg Oral BID  .  furosemide  160 mg Intravenous Q12H  . hydrALAZINE  10 mg Oral TID  . insulin aspart  0-5 Units Subcutaneous QHS  . insulin aspart  0-9 Units Subcutaneous TID WC  . isosorbide dinitrate  20 mg Oral TID  . levETIRAcetam  500 mg Oral BID  . methylphenidate  5 mg Oral Daily  . miconazole   Topical BID  . multivitamin with minerals  1 tablet Oral Daily  . polyethylene glycol  17 g Oral Daily  . pregabalin  75 mg Oral BID  . sertraline  150 mg Oral Daily  . sodium chloride  3 mL Intravenous Q12H  . vancomycin  125 mg Oral QID  . warfarin  6 mg Oral ONCE-1800  . Warfarin - Pharmacist Dosing Inpatient   Does not apply q1800   Continuous Infusions:  PRN Meds:.LORazepam, promethazine, traMADol  Assessment/Plan:  Ms. Dowis is a 53 yo woman with multiple co-morbidities who presented from dialysis with sharp, burning chest pain on 03/11/14. She has had no recurrence of this pain, a negative i-stat troponin and unchanged EKG. Her initial XR showed  extensive edema diffusely and her proBNP [place] elevated [place] [address]; she has responded to aggressive diuresis with lasix. In fact, she is currently not using supplemental oxygen, on which she was dependent at home. Interestingly, after a recent hospitalization, she began receiving HD; on evaluation by nephrology on this admission, it appears that she may not need continued dialysis. That said, her urine output has been limited, so she will require close monitoring for this issue; her IV lasix was recently increased from 80 TID to 160 BID. Furthermore, she has been found to be infected with c difficile and is being treated with a course of oral vancomycin for high risk infection.  Acute on Chronic Systolic Heart Failure: Given her initial CXR and proBNP (52,920 on admission, up from 1850 two years ago), she is likely in volume overload due to acute exacerbation of heart failure. Echo on this admission with EF 35-40%. IV Lasix has been given; I&Os have not been accurately documented despite foley in place, but nursing was made aware and accurately measured only 500 mL urine output overnight. Weight has been recorded as ~4 pound increase. Repeat CXR 10/3 showed improvement in bilateral aeration with some mild residual right perihilar opacity (s/o mild lingering edema). Difficult to get a good exam for volume status (due to body habitus on lung exam and BKA/AKA for assessment of LE edema). Subjectively, patient is breathing more easily. Chest pain (at outside HD center) has not recurred and was described as stabbing and burning - perhaps related to untreated GERD (she has a GERD hx). Appears to have produced 500 mL urine over past 12 hours. - Continue increased dose of lasix 160 mg IV BID daily for aggressive diuresis - Continue hydralazine and carvedilol; spironolactone was d/c yesterday - Strict I's & O's - Continue nitro TID - Continue home protonix - Continue tele monitoring  Clostridium Difficile Infection:   The patient complained of fever prior to admission (never recorded) and reported diarrhea. She feels the amount of diarrhea has not changed. She has not had fever or leukocytosis this admission. She is on day 4 of 14 of her PO vancomycin. - Continue PO vancomycin 125 mg four times per day. Guidelines are to continue this treatment for 10 to 14 days. This may be escalated to 500 mg at the same frequency if she does not show signs of improvement - Enteric precautions -  CBC/fever monitoring - Cleared by SNF to return while still being treated  CKD3: Nephrology feels the patient dose not need continued HD given relatively good GFR. Appreciate renal input. Cr is trending up: 1.34-->2.13-->2.21-->2.33 this admission with aggressive diuresis. She likely needs continued diuresis and we will need strict I&Os to monitor progress. TDC is now removed. - Continue IV 160 mg BID - Monitoring I/O and renal function  Hyponatremia:  Na 129-->132. She is AAO x3 and has not been confused or nauseous. Low Na is possibly due to Lasix or volume overload. - continue to diurese and monitor renal function  GERD: Has diagnosis of GERD, but does not take PPI. Initially put on PPI here, but switched to H2 blocker in context of c diff infection and risk of reinfection on long-term use PPI.  - Discontinued protonix - Started pepcid; consider sending her home on this medication   DMII: Stable. Most recent A1c in our records is 5.3% (but has been as high as 13.9%) with peripheral neuropathy and complications that led to her BKA and AKA. On 5 novolog with meals at home.  - Continue home Lyrica for neuropathy and phantom limb pain - Continue ISS (sensitive renal), sliding scale at bedtime  Essential Hypertension: Stable - Continue home carvedilol, hydralazine; spironolactone d/c yesterday  History of Multiple CVAs: Residual deficits include cognitive slowing and left-sided weakness. Patient is on coumadin, atorvastatin.    Atrial Fibrillation: Documented in [facility] records. Patient also has a history of DVT. - Continue coumadin per pharmacy; finally therapeutic at 2.28.  Morbid Obesity: bed bound at SNF  - Appreciate PT visit, SLP and OT attempted visit; recommend return to SNF  History of Seizure:  - Continue home keppra   Sleep Apnea:  - Continue home bipap at night   Depression and Anxiety: Has been using PRN ativan twice a day - Continue home ativan and sertraline   DVT Ppx:  - Patient is on coumadin  Diet: Speech assessment: dysphagia 3 (mechanical 3) diet   Dispo: Disposition is deferred at this time, awaiting improvement of current medical problems.  Anticipated discharge in approximately 1 day(s).   The patient does have a current PCP Rogene Houston, MD) and does not need an [facility] hospital follow-up appointment after discharge.  The patient does have transportation limitations that hinder transportation to clinic appointments.  .Services Needed at time of discharge: Y = Yes, Blank = No PT:   OT:   RN:   Equipment:   Other:     LOS: 5 days   Drucilla Schmidt, MD 03/16/2014, 11:04 AM  I have seen and examined Ms. Cutsforth today with Dr. Sherrine Maples. Ms. Millien is improving with diuresis. She's had no further chest pain and her CHF has resolved clinically. Her creatinine has trended upward but it appears that she will be able to avoid hemodialysis in the near future. She is still having some diarrhea but it appears that her C. difficile colitis is improving slowly. She should be ready for transfer back to her skilled nursing facility since.  Michel Bickers, MD [facility] for [facility] [phone] pager   [phone] cell 03/16/2014, 12:34 PM

## 2014-03-16 NOTE — Evaluation (Signed)
Clinical/Bedside Swallow Evaluation Patient Details  Name: KAWTHAR OSUCH MRN: [id number] Date of Birth: Oct 15, 1960  Today's Date: 03/16/2014 Time: [id number] SLP Time Calculation (min): 41 min  Past Medical History:  Past Medical History  Diagnosis Date  . HTN (hypertension)   . HLD (hyperlipidemia)   . History of methicillin resistant staphylococcus aureus (MRSA)   . CVA (cerebral infarction)   . Anemia   . Tachycardia   . Obesity   . Lower back pain     Chronic  . Idiopathic peripheral neuropathy     NOS  . DJD (degenerative joint disease)   . Headache(784.0)   . GERD (gastroesophageal reflux disease)   . Depression   . Anxiety   . Hypokalemia     resolved  . Cerebral infarct     left parietal and bilateral  . Esophagitis     distal  . Fluid overload     compensated  . Urinary retention   . H/O: pneumonia   . History of bacteremia   . Surgery, other elective     of the ear  . Stroke     Hx of left frontoparietal stroke in 12/11. History of right brain stroke.  . Cardiomyopathy     Echocardiogram 04/23/11: Mild LVH, EF 30-35%, mild MR, mild LAE, mild-moderate RVE.  TEE 04/25/11: Moderate LVH, severe biventricular dysfunction, EF 20-25%, moderate to severe MR, moderate TR, biatrial enlargement, no PFO, negative bubble study  . Shortness of breath     with exertion   . Sleep apnea     no machine used  pt does not remember test for sleep apnea  . DM type 2 (diabetes mellitus, type 2)     type two   oral meds only   . 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 UTI (lower urinary tract infection) 10/19/11    had approx April 2013  . Osteomyelitis   . DJD (degenerative joint disease)   . Urinary retention   . Pneumonia   . Peripheral vascular disease    Past Surgical History:  Past Surgical History  Procedure Laterality Date  . Tympanoplasty    . Toe amputation      left 2nd toe  . Dilation and curettage of uterus      history  . Tee without  cardioversion  04/25/2011    Procedure: TRANSESOPHAGEAL ECHOCARDIOGRAM (TEE);  Surgeon: Jolaine Artist, MD;  Location: [facility] ENDOSCOPY;  Service: Cardiovascular;  Laterality: N/A;  . Esophagogastroduodenoscopy  08/14/2011    Procedure: ESOPHAGOGASTRODUODENOSCOPY (EGD);  Surgeon: Scarlette Shorts, MD;  Location: [facility] ENDOSCOPY;  Service: Endoscopy;  Laterality: N/A;  . Amputation  01/18/2012    Procedure: AMPUTATION BELOW KNEE;  Surgeon: Newt Minion, MD;  Location: [place];  Service: Orthopedics;  Laterality: Left;  Left Below Knee Amputation  . Amputation Right 08/01/2012    Procedure: Right Below Knee Amputation;  Surgeon: Newt Minion, MD;  Location: [place];  Service: Orthopedics;  Laterality: Right;  Right Below Knee Amputation  . Pars plana vitrectomy Left 01/28/2013    Procedure: PARS PLANA VITRECTOMY WITH 23 GAUGE;  Surgeon: Adonis Brook, MD;  Location: [place];  Service: Ophthalmology;  Laterality: Left;  . Photocoagulation with laser Left 01/28/2013    Procedure: PHOTOCOAGULATION WITH LASER;  Surgeon: Adonis Brook, MD;  Location: [place];  Service: Ophthalmology;  Laterality: Left;  ENDOLASER  . Membrane peel Left 01/28/2013    Procedure: MEMBRANE PEEL;  Surgeon: Adonis Brook, MD;  Location: [place];  Service: Ophthalmology;  Laterality: Left;  . Amputation Left 03/18/2013    Procedure: AMPUTATION ABOVE KNEE;  Surgeon: Newt Minion, MD;  Location: [place];  Service: Orthopedics;  Laterality: Left;   HPI:  Ms. Shaefer is a 53 yo woman with a PMH of ESRD (HD initiated 1 week ago, today was her fourth session on a T/R/Sa schedule), CHF (LVEF 20%), right MCA CVA, left frontoparietal CVA, DMII, HTN and chronic lower back pain who presented to the ED after experiencing chest pain 2 hours and 40 minutes into her 3 hour dialysis session early this morning. Per RN at the dialysis center, she was conversational at the time, and described her chest pain as stabbing and burning and rated it at a 4/10 in intensity. At the time  of the pain, her EKG was normal and her vitals were stable (130/54, HR 84, T 98.2, CBG 96). Because of her cardiac history, members of the dialysis team activated EMS.  CXR:   Significantly improved with some mild residual right perihilar opacity.   Assessment / Plan / Recommendation Clinical Impression  Patient appears to have normal swallow function at bedside, however, frequent burping was observed after minimal intake, and pt. reports pills and sometimes food "comes back up."  No overt s/s of aspiration were noted during this evaluation, but pt may aspirate reflux at times.  Currently LS are diminished, pt. is afebrile, Most recent CXR with some mild residual right perihilar opacity, suggesting some lingering edema.  There was no cough or throat clearing after swallowing various consistencies, and voice remains clear.  Laryngeal elevation difficult to palpate due to body habitus.      Aspiration Risk  Mild    Diet Recommendation Dysphagia 3 (Mechanical Soft);Thin liquid   Liquid Administration via: Straw Medication Administration: Whole meds with liquid Supervision: Staff to assist with self feeding;Full supervision/cueing for compensatory strategies Compensations: Slow rate;Small sips/bites Postural Changes and/or Swallow Maneuvers: Seated upright 90 degrees;Upright 30-60 min after meal    Other  Recommendations Oral Care Recommendations: Oral care BID;Staff/trained caregiver to provide oral care Other Recommendations: Clarify dietary restrictions   Follow Up Recommendations  Skilled Nursing facility    Frequency and Duration min 1 x/week  1 week   Pertinent Vitals/Pain C/O pain in right leg "7".  Attempted to reposition pt, and called Nursing to assist with repositioning.    SLP Swallow Goals     Swallow Study Prior Functional Status       General HPI: Ms. Soscia is a 53 yo woman with a PMH of ESRD (HD initiated 1 week ago, today was her fourth session on a T/R/Sa schedule),  CHF (LVEF 20%), right MCA CVA, left frontoparietal CVA, DMII, HTN and chronic lower back pain who presented to the ED after experiencing chest pain 2 hours and 40 minutes into her 3 hour dialysis session early this morning. Per RN at the dialysis center, she was conversational at the time, and described her chest pain as stabbing and burning and rated it at a 4/10 in intensity. At the time of the pain, her EKG was normal and her vitals were stable (130/54, HR 84, T 98.2, CBG 96). Because of her cardiac history, members of the dialysis team activated EMS.  CXR:   Significantly improved with some mild residual right perihilar opacity. Type of Study: Bedside swallow evaluation Previous Swallow Assessment: None found Diet Prior to this Study: Regular;Thin liquids Temperature Spikes  Noted: No Respiratory Status: Nasal cannula History of Recent Intubation: No Behavior/Cognition: Alert;Cooperative;Requires cueing Oral Cavity - Dentition: Missing dentition Self-Feeding Abilities: Needs assist Patient Positioning: Upright in bed Baseline Vocal Quality: Clear Volitional Cough: Strong Volitional Swallow: Able to elicit    Oral/Motor/Sensory Function Overall Oral Motor/Sensory Function: Appears within functional limits for tasks assessed   Ice Chips Ice chips: Within functional limits Presentation: Spoon   Thin Liquid Thin Liquid: Within functional limits Presentation: Spoon;Cup;Straw    Nectar Thick Nectar Thick Liquid: Not tested   Honey Thick Honey Thick Liquid: Not tested   Puree Puree: Not tested (Pt refused applesauce)   Solid   GO    Solid: Within functional limits       Diontae Route T 03/16/2014,11:29 AM

## 2014-03-17 LAB — CBC
HCT: 29.8 % — ABNORMAL LOW (ref 36.0–46.0)
HEMOGLOBIN: 9.5 g/dL — AB (ref 12.0–15.0)
MCH: 28.1 pg (ref 26.0–34.0)
MCHC: 31.9 g/dL (ref 30.0–36.0)
MCV: 88.2 fL (ref 78.0–100.0)
PLATELETS: 280 10*3/uL (ref 150–400)
RBC: 3.38 MIL/uL — ABNORMAL LOW (ref 3.87–5.11)
RDW: 19.2 % — ABNORMAL HIGH (ref 11.5–15.5)
WBC: 5.8 10*3/uL (ref 4.0–10.5)

## 2014-03-17 LAB — GLUCOSE, CAPILLARY
GLUCOSE-CAPILLARY: 105 mg/dL — AB (ref 70–99)
Glucose-Capillary: 111 mg/dL — ABNORMAL HIGH (ref 70–99)
Glucose-Capillary: 104 mg/dL — ABNORMAL HIGH (ref 70–99)
Glucose-Capillary: 122 mg/dL — ABNORMAL HIGH (ref 70–99)

## 2014-03-17 LAB — BASIC METABOLIC PANEL
Anion gap: 15 (ref 5–15)
BUN: 31 mg/dL — ABNORMAL HIGH (ref 6–23)
CHLORIDE: 90 meq/L — AB (ref 96–112)
CO2: 23 meq/L (ref 19–32)
Calcium: 9 mg/dL (ref 8.4–10.5)
Creatinine, Ser: 2.53 mg/dL — ABNORMAL HIGH (ref 0.50–1.10)
GFR calc Af Amer: 24 mL/min — ABNORMAL LOW (ref >90)
GFR, EST NON AFRICAN AMERICAN: 21 mL/min — AB (ref >90)
GLUCOSE: 110 mg/dL — AB (ref 70–99)
POTASSIUM: 3.9 meq/L (ref 3.7–5.3)
SODIUM: 128 meq/L — AB (ref 137–147)

## 2014-03-17 LAB — PROTIME-INR
INR: 2.88 — ABNORMAL HIGH (ref 0.00–1.49)
Prothrombin Time: 30.2 seconds — ABNORMAL HIGH (ref 11.6–15.2)

## 2014-03-17 MED ORDER — FUROSEMIDE 10 MG/ML IJ SOLN
160.0000 mg | Freq: Three times a day (TID) | INTRAVENOUS | Status: DC
  Administered 2014-03-17 – 2014-03-19 (×8): 160 mg via INTRAVENOUS
  Filled 2014-03-17 (×10): qty 16

## 2014-03-17 NOTE — Progress Notes (Signed)
  I have seen and examined the patient myself, and I have reviewed the note by Theressa Stamps, MS 3 and was present during the interview and physical exam.  Please see my separate H&P for additional findings, assessment, and plan.   Signed: Drucilla Schmidt, MD 03/17/2014, 7:40 AM

## 2014-03-17 NOTE — Progress Notes (Signed)
S: Co pain in Rt stump O:BP 153/61  Pulse 95  Temp(Src) 98 F (36.7 C) (Oral)  Resp 18  Ht 5' (1.524 m)  Wt 121 kg (266 lb 12.1 oz)  BMI 52.10 kg/m2  SpO2 95%  Intake/Output Summary (Last 24 hours) at 03/17/14 0733 Last data filed at 03/17/14 0630  Gross per 24 hour  Intake    530 ml  Output    277 ml  Net    253 ml   Weight change: -0.519 kg (-1 lb 2.3 oz) [id number] and alert CVS:RRR Resp:Decreased BS throughout Abd:+ BS NT ND  + subQ edema abd wall Ext: + edema  No overt lesions on Rt stump NEURO:Ox3 no asterixis   . allopurinol  100 mg Oral Daily  . antiseptic oral rinse  7 mL Mouth Rinse q12n4p  . atorvastatin  20 mg Oral QHS  . carvedilol  3.125 mg Oral BID WC  . chlorhexidine  15 mL Mouth Rinse BID  . famotidine  20 mg Oral Daily  . ferrous sulfate  325 mg Oral BID  . furosemide  160 mg Intravenous Q12H  . hydrALAZINE  10 mg Oral TID  . insulin aspart  0-5 Units Subcutaneous QHS  . insulin aspart  0-9 Units Subcutaneous TID WC  . isosorbide dinitrate  20 mg Oral TID  . levETIRAcetam  500 mg Oral BID  . methylphenidate  5 mg Oral Daily  . miconazole   Topical BID  . multivitamin with minerals  1 tablet Oral Daily  . polyethylene glycol  17 g Oral Daily  . pregabalin  75 mg Oral BID  . sertraline  150 mg Oral Daily  . sodium chloride  3 mL Intravenous Q12H  . vancomycin  125 mg Oral QID  . Warfarin - Pharmacist Dosing Inpatient   Does not apply [id number]   Ir Removal Tun Cv Cath W/o Fl  03/15/2014   CLINICAL DATA:  Renal failure with previous placement of tunneled hemodialysis catheter. The patient has recovered renal function and requires removal of the tunneled dialysis catheter.  EXAM: REMOVAL OF TUNNELED CENTRAL VENOUS CATHETER  PROCEDURE: The right chest dialysis catheter site was prepped with chlorhexidine. A sterile gown and gloves were worn during the procedure. Local anesthesia was provided with 1% lidocaine.  Utilizing manual traction, the subcutaneous  cuff of the dialysis catheter was freed. The catheter was then successfully removed in its entirety. A sterile dressing was applied over the catheter exit site.  IMPRESSION: Removal of tunneled dialysis catheter utilizing sharp and blunt dissection. The procedure was uncomplicated.   Electronically Signed   By: Aletta Edouard M.D.   On: 03/15/2014 11:50   BMET    Component Value Date/Time   NA 128* 03/17/2014 0437   K 3.9 03/17/2014 0437   CL 90* 03/17/2014 0437   CO2 23 03/17/2014 0437   GLUCOSE 110* 03/17/2014 0437   BUN 31* 03/17/2014 0437   CREATININE 2.53* 03/17/2014 0437   CALCIUM 9.0 03/17/2014 0437   GFRNONAA 21* 03/17/2014 0437   GFRAA 24* 03/17/2014 0437   CBC    Component Value Date/Time   WBC 5.8 03/17/2014 0437   RBC 3.38* 03/17/2014 0437   RBC 3.22* 03/14/2014 0308   HGB 9.5* 03/17/2014 0437   HCT 29.8* 03/17/2014 0437   PLT 280 03/17/2014 0437   MCV 88.2 03/17/2014 0437   MCH 28.1 03/17/2014 0437   MCHC 31.9 03/17/2014 0437   RDW 19.2* 03/17/2014 0437   LYMPHSABS 1.0  03/11/2014 1145   MONOABS 0.7 03/11/2014 1145   EOSABS 0.2 03/11/2014 1145   BASOSABS 0.0 03/11/2014 1145     Assessment: 1. CKD 4.  Suspect she had some acute on CKD requiring HD for volume control.   Scr cont to slowly increase and UO marginal 2. DM 3. Anemia and Fe def, received 510mg  feraheme 4. HTN 5. Hx CVA 6. C Diff Plan: 1. Increase lasix dose 2. Pt not interested in resuming HD and is agreeable to treating what we can medically.  I spoke to her nephrologist, Dr Kateri Plummer, and he agrees that she is a poor HD candidate but her sister was pressuring for it to be done.  Her overall prognosis is poor whether HD is done or not.  It might be best to get palliative care involved and get a game plan prior to considering DC  [name] T

## 2014-03-17 NOTE — Progress Notes (Addendum)
Speech Language Pathology Treatment: Dysphagia  Patient Details Name: [name] MRN: [id number] DOB: 06/07/1961 Today's Date: 03/17/2014 Time: 7915-0413 SLP Time Calculation (min): 14 min  Assessment / Plan / Recommendation Clinical Impression  Pt reports swallow ability to be intact except complaints of reflux symptoms *frequent belching and n/v if she takes pills without plenty of food/water.  SLP assisted pt to consume orange juice via straw with HOB elevated to approx 45% due to pt c/o pain.  No symptoms of aspiration noted but pt swallow mildly delayed.  Pt states she takes her medicine with applesauce crushed since her recent CVA.    Advised pt to follow strict reflux precautions including raising HOB.  Also encouraged her to speak to her MD regarding ongoing symptoms.  All education completed with pt and pt has met goals.  No further SLP warranted.    HPI HPI: [name] is a 53 yo woman with a PMH of ESRD (HD initiated 1 week ago, today was her fourth session on a T/R/Sa schedule), CHF (LVEF 20%), right MCA CVA, left frontoparietal CVA, DMII, HTN and chronic lower back pain who presented to the ED after experiencing chest pain 2 hours and 40 minutes into her 3 hour dialysis session early this morning. Per RN at the dialysis center, she was conversational at the time, and described her chest pain as stabbing and burning and rated it at a 4/10 in intensity. At the time of the pain, her EKG was normal and her vitals were stable (130/54, HR 84, T 98.2, CBG 96). Because of her cardiac history, members of the dialysis team activated EMS.  CXR:   Significantly improved with some mild residual right perihilar opacity.   Pertinent Vitals Pain Assessment:  (leg pain with HOB elevation) Pain Location: leg Pain Intervention(s): Repositioned  SLP Plan  All goals met    Recommendations Diet recommendations: Dysphagia 3 (mechanical soft);Thin liquid Liquids provided via: Cup;Straw Medication  Administration: Crushed with puree (per pt request; meds have been crushed since her CVA) Supervision: Staff to assist with self feeding;Full supervision/cueing for compensatory strategies Compensations: Slow rate;Small sips/bites Postural Changes and/or Swallow Maneuvers: Seated upright 90 degrees;Upright 30-60 min after meal              Oral Care Recommendations: Oral care BID;Staff/trained caregiver to provide oral care Follow up Recommendations: Skilled Nursing facility Plan: All goals met    [place], [place] [facility] SLP [phone]

## 2014-03-17 NOTE — Progress Notes (Signed)
Pt noted to be more lethargic at times throughout the day. Pt's IV lasix increased to TID this am. Very minimal urine output this shift so far, which is noted to be less than the previous two shifts. 35cc per bladder scan, none noted in foley bag at this time. VSS. Dr. Hayes Ludwig paged and made aware of above findings. Stated this isn't a new finding for the patient, as this has been going on for past three days. Pallative Consult to be ordered per MD. Will continue to monitor pt closely.

## 2014-03-17 NOTE — Progress Notes (Signed)
Palliative medicine team consult received -will meet patient and family at [facility] on 10/8 to discuss goals of care, symptom management and advance care planning.  Lane Hacker, DO Palliative Medicine

## 2014-03-17 NOTE — Progress Notes (Signed)
Pt. Was placed on BIPAP 10/5 via [facility] with 3L O2 bled in. Pt. Is tolerating BIPAP well at this time without any complications.

## 2014-03-17 NOTE — Progress Notes (Signed)
Pt asleep when chaplain came by. Will return later.  Vanetta Mulders 03/17/2014 4:04 PM

## 2014-03-17 NOTE — Care Management Note (Signed)
    Page 1 of 1   03/19/2014     3:36:11 PM CARE MANAGEMENT NOTE 03/19/2014  Patient:  [name], [name]   Account Number:  [id number]  Date Initiated:  03/15/2014  Documentation initiated by:  [facility]  Subjective/Objective Assessment:   CHF     Action/Plan:   SNF   Anticipated DC Date:     Anticipated DC Plan:  [place]  CM consult      Choice offered to / List presented to:             Status of service:  In process, will continue to follow Medicare Important Message given?  YES (If response is "NO", the following Medicare IM given date fields will be blank) Date Medicare IM given:  03/15/2014 Medicare IM given by:  [facility] Date Additional Medicare IM given:  03/19/2014 Additional Medicare IM given by:  Marvetta Gibbons  Discharge Disposition:  [place]  Per UR Regulation:  Reviewed for med. necessity/level of care/duration of stay  If discussed at [place] of Stay Meetings, dates discussed:   03/16/2014  03/18/2014    Comments:  03-19-14 Jacqlyn Krauss, RN,BSN [phone] Plan for SNF placement today.   0-7-15 [address] [place] [phone] Per MD notes plan for aggressive diuresis tid and once stable return to SNF. CM and CSW will continue to monitor.   03/15/14- 1600- Marvetta Gibbons RN, BSN [phone] Pt continues to be on IV lasix 160 mg BID for CHF, positive cdiff- plan to return to SNF when medically stable- CSW following

## 2014-03-17 NOTE — Progress Notes (Addendum)
Subjective: [name] feels "great" this morning. She is experiencing no pain. Her diarrhea has lessened. She says that off of O2 yesterday, she became slightly short of breath and was put back on 3L [place]. She would like to go back to her SNF today.  Objective: Vital signs in last 24 hours: Filed Vitals:   03/16/14 1617 03/16/14 2100 03/16/14 2345 03/17/14 0655  BP: 144/79 146/61  153/61  Pulse: 87 94 86 95  Temp:  98.3 F (36.8 C)  98 F (36.7 C)  TempSrc:  Oral  Oral  Resp:  18 18 18   Height:      Weight:    266 lb 12.1 oz (121 kg)  SpO2:  94% 100% 95%   Weight change: -1 lb 2.3 oz (-0.519 kg)  Intake/Output Summary (Last 24 hours) at 03/17/14 0741 Last data filed at 03/17/14 0630  Gross per 24 hour  Intake    530 ml  Output    277 ml  Net    253 ml   Physical Exam: Appearance: morbidly obese woman lying in bed with eyes closed, quite conversational today. [name] at 2 L. HEENT: AT/[place], TDC is now removed Heart: RRR, no m/r/g Lungs: anterior exam: diminished breath sounds throughout but difficult exam due to body habitus Abdomen: obese abdomen, + BS present, nontender, no rebound/guarding/ridigity, abdomen is soft GU: foley in place with ~ 100cc of clear yellow urine Extremities: right BKA and left AKA, well-healed. Right arm atrophied in comparison to left arm, right arm has resting tremor Neurologic:A&Ox3, appropriate verbal responses  Tele: one episode of non-sustained ventricular tachycardia  Lab Results: Basic Metabolic Panel:  Recent Labs Lab 03/13/14 0507  03/16/14 0504 03/17/14 0437  NA 137  < > 132* 128*  K 3.9  < > 4.0 3.9  CL 100  < > 93* 90*  CO2 23  < > 23 23  GLUCOSE 74  < > 114* 110*  BUN 19  < > 29* 31*  CREATININE 1.86*  < > 2.33* 2.53*  CALCIUM 9.2  < > 9.1 9.0  PHOS 2.2*  --  2.7  --   < > = values in this interval not displayed. CBC:  Recent Labs Lab 03/11/14 1145  03/14/14 0308 03/17/14 0437  WBC 5.4  < > 4.5 5.8  NEUTROABS 3.4  --    --   --   HGB 10.1*  < > 9.3* 9.5*  HCT 32.3*  < > 29.7* 29.8*  MCV 91.0  < > 92.2 88.2  PLT 98*  < > 187 280  < > = values in this interval not displayed.  Coagulation:  Recent Labs Lab 03/14/14 0308 03/15/14 0438 03/16/14 0504 03/17/14 0437  LABPROT 17.9* 21.0* 25.1* 30.2*  INR 1.48 1.81* 2.28* 2.88*   Medications: I have reviewed the patient's current medications. Scheduled Meds: . allopurinol  100 mg Oral Daily  . antiseptic oral rinse  7 mL Mouth Rinse q12n4p  . atorvastatin  20 mg Oral QHS  . carvedilol  3.125 mg Oral BID WC  . chlorhexidine  15 mL Mouth Rinse BID  . famotidine  20 mg Oral Daily  . ferrous sulfate  325 mg Oral BID  . furosemide  160 mg Intravenous Q12H  . hydrALAZINE  10 mg Oral TID  . insulin aspart  0-5 Units Subcutaneous QHS  . insulin aspart  0-9 Units Subcutaneous TID WC  . isosorbide dinitrate  20 mg Oral TID  . levETIRAcetam  500 mg Oral BID  . methylphenidate  5 mg Oral Daily  . miconazole   Topical BID  . multivitamin with minerals  1 tablet Oral Daily  . polyethylene glycol  17 g Oral Daily  . pregabalin  75 mg Oral BID  . sertraline  150 mg Oral Daily  . sodium chloride  3 mL Intravenous Q12H  . vancomycin  125 mg Oral QID  . Warfarin - Pharmacist Dosing Inpatient   Does not apply q1800   Continuous Infusions:  PRN Meds:.LORazepam, promethazine, traMADol  Assessment/Plan:  [name] is a 53 yo woman with multiple co-morbidities who presented from dialysis with sharp, burning chest pain on 03/11/14. She had no recurrence of this pain, a negative i-stat troponin and unchanged EKG, but her initial XR showed extensive edema diffusely. She has continued to respond clinically to aggressive diuresis with lasix. In fact, as of yesterday, she is no longer using supplemental oxygen, on which she was dependent at home (3L). Furthermore, her recently initiated dialysis has been stopped by nephrology, who question whether she was a good candidate  from the start. It appears that she may not need continued dialysis and her [facility] has been removed. That said, her urine output has been limited, so she will require close monitoring for this issue; her IV lasix was recently increased from 80 TID to 160 BID. Furthermore, she has been found to be infected with c difficile and is being treated with a course of oral vancomycin for high risk infection.  Acute on Chronic Systolic Heart Failure: Given her initial CXR and proBNP (52,920 on admission, up from 1850 two years ago), she was likely in volume overload due to acute exacerbation of heart failure. Echo on this admission with EF 35-40%. IV Lasix currently running throught IV. Is & Os recorded as +1746 yesterday with only 275 mL urine. Weight has been recorded as ~21 pound increase, but doubt accuracy of weights. Repeat CXR 10/3 showed improvement in bilateral aeration with some mild residual right perihilar opacity (s/o mild lingering edema). Difficult to get a good exam for volume status (due to body habitus on lung exam and BKA/AKA for assessment of LE edema). Subjectively, patient is breathing more easily. Chest pain (at outside HD center) has not recurred and was described as stabbing and burning - perhaps related to untreated GERD (she has a GERD hx).  - Appreciate renal consult and [name] correspondence with patient's outpatient nephrologist ([name]) - Increased dose of lasix 160 mg IV TID daily for aggressive diuresis; spoke with [name] about converting patient to 160mg  TID PO as outpatient - Continue hydralazine and carvedilol; spironolactone has been d/c - Strict I's & O's - Continue nitro TID - Continue pepcid - Continue tele monitoring - Initiating goals of care discussion with patient today  Clostridium Difficile Infection:  The patient complained of fever prior to admission (never recorded) and reported diarrhea. She feels the amount of diarrhea has decreased. She has  not had fever or leukocytosis this admission. She is on day 5 of 14 of her PO vancomycin. - Continue PO vancomycin 125 mg four times per day. Guidelines are to continue this treatment for 10 to 14 days. This may be escalated to 500 mg at the same frequency if she does not show signs of improvement - Enteric precautions - CBC/fever monitoring - Cleared by SNF to return while still being treated  CKD4: Nephrology feels the patient does not need continued HD given relatively  good GFR (though this has fallen to 25). Appreciate renal input. Of note, two nephrologists have now suggested that [name] is a poor HD candidate while in this hospital; both question why the HD was initiated. Cr is trending up: 1.34-->2.13-->2.21-->2.33-->2.53 this admission with aggressive diuresis. She likely needs continued diuresis and we will need strict I&Os to monitor progress. TDC is now removed. - Increase lasix to IV 160 mg TID - Monitoring I/O and renal function  Hyponatremia:  Na 129-->132-->128. She is AAO x3 and has not been confused or nauseous. Low Na is possibly due to Lasix or volume overload. - continue to diurese and monitor renal function  GERD: Has diagnosis of GERD, but does not take PPI. Initially put on PPI here, but switched to H2 blocker in context of c diff infection and risk of reinfection on long-term use PPI.  - Discontinued protonix - Started pepcid; consider sending her home on this medication   DMII: Stable. Most recent A1c in our records is 5.3% (but has been as high as 13.9%) with peripheral neuropathy and complications that led to her BKA and AKA. On 5 novolog with meals at home.  - Continue home Lyrica for neuropathy and phantom limb pain - Continue ISS (sensitive renal), sliding scale at bedtime  Essential Hypertension: Stable - Continue home carvedilol, hydralazine; spironolactone d/c  History of Multiple CVAs: Residual deficits include cognitive slowing and left-sided weakness.  Patient is on coumadin, atorvastatin.   Atrial Fibrillation: Documented in [facility] records. Patient also has a history of DVT. - Continue coumadin per pharmacy; finally therapeutic yesterday (and 2.88 today)  Morbid Obesity: bed bound at SNF  - Appreciate PT visit, SLP and OT attempted visit; recommend return to SNF  History of Seizure:  - Continue home keppra   Sleep Apnea:  - Continue home bipap at night   Depression and Anxiety: Has been using PRN ativan twice a day - Continue home ativan and sertraline   DVT Ppx:  - Patient is on coumadin  Diet: Speech assessment: dysphagia 3 (mechanical 3) diet; medications crushed with puree. All seated upright 90 degrees.  Dispo: Disposition is deferred at this time, awaiting improvement of current medical problems.  Anticipated discharge in approximately 0 day(s).   The patient does have a current PCP Sherry Houston, MD) and does not need an [facility] hospital follow-up appointment after discharge.  The patient does have transportation limitations that hinder transportation to clinic appointments.  .Services Needed at time of discharge: Y = Yes, Blank = No PT:   OT:   RN:   Equipment:   Other:     LOS: 6 days   Sherry Schmidt, MD 03/17/2014, 7:41 AM

## 2014-03-17 NOTE — Progress Notes (Signed)
Subjective: [name] feels "fine" this morning,  continues to have some phantom limb pain and asked for some toradol while I was in the room.  She says that she has not had any diarrhea today   Objective: Vital signs in last 24 hours: Filed Vitals:   03/16/14 1617 03/16/14 2100 03/16/14 2345 03/17/14 0655  BP: 144/79 146/61  153/61  Pulse: 87 94 86 95  Temp:  98.3 F (36.8 C)  98 F (36.7 C)  TempSrc:  Oral  Oral  Resp:  18 18 18   Height:      Weight:    121 kg (266 lb 12.1 oz)  SpO2:  94% 100% 95%   Weight change: -0.519 kg (-1 lb 2.3 oz)  Intake/Output Summary (Last 24 hours) at 03/17/14 1108 Last data filed at 03/17/14 0630  Gross per 24 hour  Intake    240 ml  Output    276 ml  Net    -36 ml   Physical Exam  Gen: NAD, morbidly obese, resting in bed with her eyes closed. Nasal canula in place, [place] at 2 L.  HEENT: AT/[place], transdermal catheter is now removed, clean dry dressing now covering site  Heart: RRR, no murmur, rubs or gallops  Lungs: diminished breath sounds anteriorly, examination limited by patients body habitus (patient is unable to hold herself up right)  Abdomen: obese abdomen, soft, slightly tender BS+ but diminished  GU: pt has foley in place with minimal amounts of clear yellow urine  Extremities: right BKA, left AKA, Right arm smaller in size than right , Right arm has resting tremor  Neuro: somnolent but A&O x 3   Lab Results: Results for orders placed during the hospital encounter of 03/11/14 (from the past 24 hour(s))  GLUCOSE, CAPILLARY     Status: Abnormal   Collection Time    03/16/14 11:50 AM      Result Value Ref Range   Glucose-Capillary 140 (*) 70 - 99 mg/dL  GLUCOSE, CAPILLARY     Status: Abnormal   Collection Time    03/16/14  5:11 PM      Result Value Ref Range   Glucose-Capillary 143 (*) 70 - 99 mg/dL  GLUCOSE, CAPILLARY     Status: Abnormal   Collection Time    03/16/14  9:25 PM      Result Value Ref Range   Glucose-Capillary  139 (*) 70 - 99 mg/dL  GLUCOSE, CAPILLARY     Status: Abnormal   Collection Time    03/16/14  9:50 PM      Result Value Ref Range   Glucose-Capillary 118 (*) 70 - 99 mg/dL  PROTIME-INR     Status: Abnormal   Collection Time    03/17/14  4:37 AM      Result Value Ref Range   Prothrombin Time 30.2 (*) 11.6 - 15.2 seconds   INR 2.88 (*) 0.00 - 99991111  BASIC METABOLIC PANEL     Status: Abnormal   Collection Time    03/17/14  4:37 AM      Result Value Ref Range   Sodium 128 (*) 137 - 147 mEq/L   Potassium 3.9  3.7 - 5.3 mEq/L   Chloride 90 (*) 96 - 112 mEq/L   CO2 23  19 - 32 mEq/L   Glucose, Bld 110 (*) 70 - 99 mg/dL   BUN 31 (*) 6 - 23 mg/dL   Creatinine, Ser 2.53 (*) 0.50 - 1.10 mg/dL   Calcium 9.0  8.4 - 10.5 mg/dL   GFR calc non Af Amer 21 (*) >90 mL/min   GFR calc Af Amer 24 (*) >90 mL/min   Anion gap 15  5 - 15  CBC     Status: Abnormal   Collection Time    03/17/14  4:37 AM      Result Value Ref Range   WBC 5.8  4.0 - 10.5 K/uL   RBC 3.38 (*) 3.87 - 5.11 MIL/uL   Hemoglobin 9.5 (*) 12.0 - 15.0 g/dL   HCT 29.8 (*) 36.0 - 46.0 %   MCV 88.2  78.0 - 100.0 fL   MCH 28.1  26.0 - 34.0 pg   MCHC 31.9  30.0 - 36.0 g/dL   RDW 19.2 (*) 11.5 - 15.5 %   Platelets 280  150 - 400 K/uL  GLUCOSE, CAPILLARY     Status: Abnormal   Collection Time    03/17/14  7:46 AM      Result Value Ref Range   Glucose-Capillary 111 (*) 70 - 99 mg/dL   Micro Results: Recent Results (from the past 240 hour(s))  URINE CULTURE     Status: None   Collection Time    03/11/14 12:00 PM      Result Value Ref Range Status   Specimen Description URINE, CATHETERIZED   Final   Special Requests NONE   Final   Culture  Setup Time     Final   Value: 03/11/2014 12:42     Performed at [place]     Final   Value: >=100,000 COLONIES/ML     Performed at [facility]   Culture     Final   Value: Multiple bacterial morphotypes present, none predominant. Suggest appropriate  recollection if clinically indicated.     Performed at [facility]   Report Status 03/12/2014 FINAL   Final  CLOSTRIDIUM DIFFICILE BY PCR     Status: Abnormal   Collection Time    03/12/14 11:35 AM      Result Value Ref Range Status   C difficile by pcr POSITIVE (*) NEGATIVE Final   Comment: CRITICAL RESULT CALLED TO, READ BACK BY AND VERIFIED WITH:     Sheppard Evens RN 12:50 03/12/14 ([email or web address])   Studies/Results: No results found. Medications: I have reviewed the patient's current medications. Scheduled Meds: . allopurinol  100 mg Oral Daily  . antiseptic oral rinse  7 mL Mouth Rinse q12n4p  . atorvastatin  20 mg Oral QHS  . carvedilol  3.125 mg Oral BID WC  . chlorhexidine  15 mL Mouth Rinse BID  . famotidine  20 mg Oral Daily  . ferrous sulfate  325 mg Oral BID  . furosemide  160 mg Intravenous TID  . hydrALAZINE  10 mg Oral TID  . insulin aspart  0-5 Units Subcutaneous QHS  . insulin aspart  0-9 Units Subcutaneous TID WC  . isosorbide dinitrate  20 mg Oral TID  . levETIRAcetam  500 mg Oral BID  . methylphenidate  5 mg Oral Daily  . miconazole   Topical BID  . multivitamin with minerals  1 tablet Oral Daily  . polyethylene glycol  17 g Oral Daily  . pregabalin  75 mg Oral BID  . sertraline  150 mg Oral Daily  . sodium chloride  3 mL Intravenous Q12H  . vancomycin  125 mg Oral QID  . Warfarin - Pharmacist Dosing Inpatient   Does not apply  q1800   Continuous Infusions:  PRN Meds:.LORazepam, promethazine, traMADol Assessment/Plan: Principal Problem:   Acute on chronic systolic heart failure Active Problems:   Type 2 diabetes mellitus with complication   Anxiety state   GERD   Morbid obesity   Anemia   Essential hypertension   Chronic kidney disease, stage 3   History of CVA (cerebrovascular accident)   History of seizure   Enteritis due to Clostridium difficile  [name] is a 53 year old woman with multiple co-morbidities who presented after having sharp,  burning chest pain during a dialysis treatment on 10/1.  Since the episode in the dialysis center there patient has not had a recurrence of this pain.  Also there have not been any changes in her EKG and her troponin has remained negative.  Her initial CXR on admission showed diffuse edema.  With aggressive diuresis this has been shown to be resolving on a subsequent CXR performed on 10/3.  Her urine output has not been significant.  Per nephrology we changed her Lasix route from IV lasix 160 mg BID to IV Lasix 160 mg TID.  On her second day of admission she was found to be C.Diff PCR positive so she is currently completing a course of oral vancomycin,she is on day 6 of her 2 week course.    Acute on Chronic systolic heart failure: Patients had CXR on 10/3 which showed improvement from day of admission 10/01. She was likely volume overloaded secondary to an acute exacerbation of her heart failure.  ECHO also showed that her EF is 35-40% instead of 20% as was suggested from records obtained from [facility].  IV Lasix was started to address her fluid overload, and strict  Is& Os had been requested multiple times from nursing staff but had not been recorded accurately which makes it difficult to determine how much she has actually been diuresing.  An accurate assessment is also difficult to determine based on exam due to the patients body habitus (she is morbidly obese with a large pannus).  Can consider the patients chest pain at HD center to be resolved as it as not recurred ; it was most likely due to untreated GERD (pt has history of GERD but was not on medication) or volume overload as she is now breathing easier.   - Increase IV Lasix 160 mg BID to 160 mg TID per nephrology recommendations  - Continue home hydralazine and carvedilol; stop spironolactone per nephrology recommendations  - Strict Is & Os  - Continue PO Pepcid 20 mg    Clostridium difficile infection/Epigastric pain: Patient noted  loosening of her stools on 10/2 and was C.Diff PCR positive on 10/02. She is high risk for C.Diff given her decreased kidney function.  Risk for recurrence based on literature is also increased due to the patient's race (African American), multiple co-morbidies and age.  We also started the patient on a PPI 1 day prior to the C.diff diagnosis.  Prolonged PPI use is associated with an increase risk for developing a C.diff infection.  Studies have also shown that PPI and H2 blocker administration started during same hospital course as C.diff diagnosis or started after clearance of infection did not increase the probably of a C.diff recurrence with in the first 15-90 days post admission.  But we switched her to an H2 blocker due to her other risk factors that place her at an increased risk for relapsing C. Diff infection.   - Continue vancomycin po  125 mg four times per day for 10-14 days  - remain on enteric precautions  - CBC/ fever monitoring  - consulted SW, no complications exist for going back to SNF on oral vanc   CKD3: Patient was seen by nephrology who determined that she was not ESRD as previously thought. Also suggested stopping HD, cathter was removed.  They also recommended stopping her sprinoloactone and going up on her IV Lasix dose. Patients creatinine has been trending up 1.34 to 2.13 to 2.21 to 2.33 to 2.53 while been aggressively dieresised, patient's baseline creatinine is ~2.  - Start IV Lasix 160 mg TID - continue monitoring renal function and strict I's & O's   GERD: Pt has hx of GERD but was not taking home med for this, She reports GERD- like symptoms here in hospital  - Discontinue protonix - Started pepcid today   Hyponatremia: Patient was found to be hyponatremic  To 129 and 131 on two subsequent CBCs collected 10/4--> 129 10/5--> 133 today.  No change in the patients mental status, still A&O x 3 also has not been confused.  Her hyponatremia is likely due to diuresis or  possibly volume overload  - continue to diurese    Iron def - Per nephrology started patient on 510 mg feraheme  DMII/ peripheral neuropathy: No change A1c 5.3% patient has BKA and AKA. DMII is controled with insulin  - Continue home lyrica for neuropathy and phantom limb pain  - continue ISS (sensitive renal), sliding scale at bed time   HTN  - continue home hydralazine and carvedilol   Hx of Seizure  - continue home keppra   Sleep Apnea  - continue bipap at night   Depression and anxiety:  - continue Ativan and sertraline   Morbid Obesity: patient is bed bound and does not move much  - Consult to PT   DVT Ppx:  - patient is on coumadin - daily PT-INR labs   This is a Careers information officer Note.  The care of the patient was discussed with Dr. Sherrine Maples and the assessment and plan formulated with their assistance.  Please see their attached note for official documentation of the daily encounter.   LOS: 6 days   Linton Ham, Med Student 03/17/2014, 11:08 AM

## 2014-03-17 NOTE — Progress Notes (Signed)
Unable to obtain urin specimen, pt bladder scanned with a result of 35 cc. MD's at bedside and made aware.

## 2014-03-18 DIAGNOSIS — Z515 Encounter for palliative care: Secondary | ICD-10-CM

## 2014-03-18 DIAGNOSIS — R531 Weakness: Secondary | ICD-10-CM

## 2014-03-18 DIAGNOSIS — Z7189 Other specified counseling: Secondary | ICD-10-CM

## 2014-03-18 DIAGNOSIS — I5020 Unspecified systolic (congestive) heart failure: Secondary | ICD-10-CM

## 2014-03-18 DIAGNOSIS — G547 Phantom limb syndrome without pain: Secondary | ICD-10-CM | POA: Diagnosis present

## 2014-03-18 LAB — RENAL FUNCTION PANEL
Albumin: 2.7 g/dL — ABNORMAL LOW (ref 3.5–5.2)
Anion gap: 17 — ABNORMAL HIGH (ref 5–15)
BUN: 35 mg/dL — ABNORMAL HIGH (ref 6–23)
CALCIUM: 9 mg/dL (ref 8.4–10.5)
CO2: 22 meq/L (ref 19–32)
CREATININE: 2.81 mg/dL — AB (ref 0.50–1.10)
Chloride: 91 mEq/L — ABNORMAL LOW (ref 96–112)
GFR calc Af Amer: 21 mL/min — ABNORMAL LOW (ref >90)
GFR calc non Af Amer: 18 mL/min — ABNORMAL LOW (ref >90)
GLUCOSE: 87 mg/dL (ref 70–99)
Phosphorus: 2.8 mg/dL (ref 2.3–4.6)
Potassium: 3.7 mEq/L (ref 3.7–5.3)
Sodium: 130 mEq/L — ABNORMAL LOW (ref 137–147)

## 2014-03-18 LAB — GLUCOSE, CAPILLARY
GLUCOSE-CAPILLARY: 88 mg/dL (ref 70–99)
GLUCOSE-CAPILLARY: 115 mg/dL — AB (ref 70–99)
GLUCOSE-CAPILLARY: 98 mg/dL (ref 70–99)
Glucose-Capillary: 137 mg/dL — ABNORMAL HIGH (ref 70–99)

## 2014-03-18 LAB — PROTIME-INR
INR: 2.96 — ABNORMAL HIGH (ref 0.00–1.49)
Prothrombin Time: 30.8 seconds — ABNORMAL HIGH (ref 11.6–15.2)

## 2014-03-18 MED ORDER — PREGABALIN 25 MG PO CAPS
25.0000 mg | ORAL_CAPSULE | Freq: Every day | ORAL | Status: DC
  Administered 2014-03-18: 25 mg via ORAL
  Filled 2014-03-18: qty 1

## 2014-03-18 MED ORDER — GI COCKTAIL ~~LOC~~: 30.0000 mL | Freq: Two times a day (BID) | ORAL | Status: DC | PRN

## 2014-03-18 MED ORDER — ONDANSETRON HCL 4 MG/2ML IJ SOLN: 4.0000 mg | Freq: Four times a day (QID) | INTRAMUSCULAR | Status: DC | PRN

## 2014-03-18 MED ORDER — SENNOSIDES-DOCUSATE SODIUM 8.6-50 MG PO TABS
2.0000 | ORAL_TABLET | Freq: Every evening | ORAL | Status: DC | PRN
  Filled 2014-03-18: qty 2

## 2014-03-18 MED ORDER — METOLAZONE 5 MG PO TABS
5.0000 mg | ORAL_TABLET | Freq: Two times a day (BID) | ORAL | Status: DC
  Administered 2014-03-18 – 2014-03-19 (×3): 5 mg via ORAL
  Filled 2014-03-18 (×5): qty 1

## 2014-03-18 MED ORDER — HYDROMORPHONE HCL 2 MG PO TABS: 2.0000 mg | ORAL_TABLET | ORAL | Status: DC | PRN

## 2014-03-18 MED ORDER — LIDOCAINE 5 % EX PTCH
2.0000 | MEDICATED_PATCH | CUTANEOUS | Status: DC
  Administered 2014-03-18 – 2014-03-19 (×2): 2 via TRANSDERMAL
  Filled 2014-03-18 (×2): qty 2

## 2014-03-18 MED ORDER — DULOXETINE HCL 20 MG PO CPEP
20.0000 mg | ORAL_CAPSULE | Freq: Every day | ORAL | Status: DC
  Administered 2014-03-19: 20 mg via ORAL
  Filled 2014-03-18 (×2): qty 1

## 2014-03-18 MED ORDER — PREGABALIN 75 MG PO CAPS: 75.0000 mg | ORAL_CAPSULE | Freq: Every day | ORAL | Status: DC

## 2014-03-18 MED ORDER — ACETAMINOPHEN 325 MG PO TABS
650.0000 mg | ORAL_TABLET | Freq: Three times a day (TID) | ORAL | Status: DC
  Administered 2014-03-18 – 2014-03-19 (×4): 650 mg via ORAL
  Filled 2014-03-18 (×4): qty 2

## 2014-03-18 MED ORDER — DIAZEPAM 2 MG PO TABS: 2.0000 mg | ORAL_TABLET | Freq: Four times a day (QID) | ORAL | Status: DC | PRN

## 2014-03-18 MED ORDER — WARFARIN SODIUM 6 MG PO TABS
6.0000 mg | ORAL_TABLET | Freq: Once | ORAL | Status: AC
  Administered 2014-03-18: 6 mg via ORAL
  Filled 2014-03-18: qty 1

## 2014-03-18 NOTE — Progress Notes (Signed)
INITIAL NUTRITION ASSESSMENT  DOCUMENTATION CODES Per approved criteria  -Morbid Obesity   INTERVENTION:  Snacks TID between meals.  NUTRITION DIAGNOSIS: Inadequate oral intake related to food preferences as evidenced by variable intake of meals.   Goal: Intake to meet >90% of estimated nutrition needs.  Monitor:  PO intake, labs, weight trend.  Reason for Assessment: MD Consult  53 y.o. female  Admitting Dx: Acute on chronic systolic heart failure  ASSESSMENT: 53 year old female with a pertinent past medical history of CHF(EF of 20%), ESRD on dialysis, bilateral MCA stroke, left frontoparietal stroke, type 2 diabetes mellitus, HTN who presented to the ED by EMS after experiencing stabbing and burning chest pain during dialysis on 10/1.   Patient's sister in her room. She reports that patient usually eats well if she is served something that she likes. She is on a low sodium diet at the nursing facility. Meal intake since admission has been 25-100% of meals. Requires assistance with meals. Sister helped her with breakfast and lunch today and she ate almost everything on those trays. Requested snacks TID between meals.  Height: Ht Readings from Last 1 Encounters:  03/11/14 5' (1.524 m)    Weight: Wt Readings from Last 1 Encounters:  03/18/14 267 lb (121.11 kg)    Ideal Body Weight: 40 kg (adjusted for bilateral amputations)  % Ideal Body Weight: 266%  Wt Readings from Last 10 Encounters:  03/18/14 267 lb (121.11 kg)  08/14/13 208 lb (94.348 kg)  03/18/13 208 lb (94.348 kg)  03/18/13 208 lb (94.348 kg)  08/01/12 220 lb (99.791 kg)  08/01/12 220 lb (99.791 kg)  01/18/12 217 lb (98.431 kg)  01/18/12 217 lb (98.431 kg)  12/07/11 232 lb (105.235 kg)  12/04/11 223 lb (101.152 kg)    Usual Body Weight: 208 lb  % Usual Body Weight: 128%  BMI:  59.1 class 3, extreme/morbid obesity  Estimated Nutritional Needs: Kcal: 2000 Protein: 90-100 gm Fluid: 2 L  Skin:  skin tear on back  Diet Order: Dysphagia 3 with thin liquids  EDUCATION NEEDS: -Education needs addressed   Intake/Output Summary (Last 24 hours) at 03/18/14 1359 Last data filed at 03/18/14 0500  Gross per 24 hour  Intake    240 ml  Output    401 ml  Net   -161 ml    Last BM: 10/7   Labs:   Recent Labs Lab 03/13/14 0507  03/16/14 0504 03/17/14 0437 03/18/14 0311  NA 137  < > 132* 128* 130*  K 3.9  < > 4.0 3.9 3.7  CL 100  < > 93* 90* 91*  CO2 23  < > 23 23 22   BUN 19  < > 29* 31* 35*  CREATININE 1.86*  < > 2.33* 2.53* 2.81*  CALCIUM 9.2  < > 9.1 9.0 9.0  PHOS 2.2*  --  2.7  --  2.8  GLUCOSE 74  < > 114* 110* 87  < > = values in this interval not displayed.  CBG (last 3)   Recent Labs  03/17/14 2035 03/18/14 0732 03/18/14 1128  GLUCAP 122* 88 115*    Scheduled Meds: . acetaminophen  650 mg Oral TID  . allopurinol  100 mg Oral Daily  . antiseptic oral rinse  7 mL Mouth Rinse q12n4p  . atorvastatin  20 mg Oral QHS  . carvedilol  3.125 mg Oral BID WC  . chlorhexidine  15 mL Mouth Rinse BID  . DULoxetine  20 mg Oral  Daily  . famotidine  20 mg Oral Daily  . furosemide  160 mg Intravenous TID  . hydrALAZINE  10 mg Oral TID  . insulin aspart  0-5 Units Subcutaneous QHS  . insulin aspart  0-9 Units Subcutaneous TID WC  . isosorbide dinitrate  20 mg Oral TID  . levETIRAcetam  500 mg Oral BID  . lidocaine  2 patch Transdermal Q24H  . metolazone  5 mg Oral BID  . miconazole   Topical BID  . pregabalin  25 mg Oral QHS  . sodium chloride  3 mL Intravenous Q12H  . vancomycin  125 mg Oral QID  . warfarin  6 mg Oral ONCE-1800  . Warfarin - Pharmacist Dosing Inpatient   Does not apply q1800    Continuous Infusions:   Past Medical History  Diagnosis Date  . HTN (hypertension)   . HLD (hyperlipidemia)   . History of methicillin resistant staphylococcus aureus (MRSA)   . CVA (cerebral infarction)   . Anemia   . Tachycardia   . Obesity   . Lower back  pain     Chronic  . Idiopathic peripheral neuropathy     NOS  . DJD (degenerative joint disease)   . Headache(784.0)   . GERD (gastroesophageal reflux disease)   . Depression   . Anxiety   . Hypokalemia     resolved  . Cerebral infarct     left parietal and bilateral  . Esophagitis     distal  . Fluid overload     compensated  . Urinary retention   . H/O: pneumonia   . History of bacteremia   . Surgery, other elective     of the ear  . Stroke     Hx of left frontoparietal stroke in 12/11. History of right brain stroke.  . Cardiomyopathy     Echocardiogram 04/23/11: Mild LVH, EF 30-35%, mild MR, mild LAE, mild-moderate RVE.  TEE 04/25/11: Moderate LVH, severe biventricular dysfunction, EF 20-25%, moderate to severe MR, moderate TR, biatrial enlargement, no PFO, negative bubble study  . Shortness of breath     with exertion   . Sleep apnea     no machine used  pt does not remember test for sleep apnea  . DM type 2 (diabetes mellitus, type 2)     type two   oral meds only   . 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 UTI (lower urinary tract infection) 10/19/11    had approx April 2013  . Osteomyelitis   . DJD (degenerative joint disease)   . Urinary retention   . Pneumonia   . Peripheral vascular disease     Past Surgical History  Procedure Laterality Date  . Tympanoplasty    . Toe amputation      left 2nd toe  . Dilation and curettage of uterus      history  . Tee without cardioversion  04/25/2011    Procedure: TRANSESOPHAGEAL ECHOCARDIOGRAM (TEE);  Surgeon: Jolaine Artist, MD;  Location[facility] ENDOSCOPY;  Service: Cardiovascular;  Laterality: N/A;  . Esophagogastroduodenoscopy  08/14/2011    Procedure: ESOPHAGOGASTRODUODENOSCOPY (EGD);  Surgeon: Scarlette Shorts, MD;  Location: [facility] ENDOSCOPY;  Service: Endoscopy;  Laterality: N/A;  . Amputation  01/18/2012    Procedure: AMPUTATION BELOW KNEE;  Surgeon: Newt Minion, MD;  Location: [place];  Service: Orthopedics;   Laterality: Left;  Left Below Knee Amputation  . Amputation Right 08/01/2012  Procedure: Right Below Knee Amputation;  Surgeon: Newt Minion, MD;  Location: [place];  Service: Orthopedics;  Laterality: Right;  Right Below Knee Amputation  . Pars plana vitrectomy Left 01/28/2013    Procedure: PARS PLANA VITRECTOMY WITH 23 GAUGE;  Surgeon: Adonis Brook, MD;  Location: [place];  Service: Ophthalmology;  Laterality: Left;  . Photocoagulation with laser Left 01/28/2013    Procedure: PHOTOCOAGULATION WITH LASER;  Surgeon: Adonis Brook, MD;  Location: [place];  Service: Ophthalmology;  Laterality: Left;  ENDOLASER  . Membrane peel Left 01/28/2013    Procedure: MEMBRANE PEEL;  Surgeon: Adonis Brook, MD;  Location: [place];  Service: Ophthalmology;  Laterality: Left;  . Amputation Left 03/18/2013    Procedure: AMPUTATION ABOVE KNEE;  Surgeon: Newt Minion, MD;  Location: [place];  Service: Orthopedics;  Laterality: Left;    Molli Barrows, RD, LDN, [place] Pager [phone] After Hours Pager [phone]

## 2014-03-18 NOTE — Progress Notes (Addendum)
Subjective: Patient asleep during visit, but as per sister, has been in no pain other than some baseline leg pain. Diarrhea continuing to improve.   Sister states that family and patient are interested in transfer from current SNF to [facility].  Objective: Vital signs in last 24 hours: Filed Vitals:   03/17/14 1400 03/17/14 2034 03/18/14 0013 03/18/14 0548  BP: 143/76 150/79  142/82  Pulse: 95 91 92 83  Temp: 99.3 F (37.4 C) 98.3 F (36.8 C)  97.2 F (36.2 C)  TempSrc: Oral Oral  Oral  Resp: 16 18 18 18   Height:      Weight:    267 lb (121.11 kg)  SpO2: 98% 100% 96% 100%   Weight change: 3.9 oz (0.11 kg)  Intake/Output Summary (Last 24 hours) at 03/18/14 1004 Last data filed at 03/18/14 0500  Gross per 24 hour  Intake    480 ml  Output    401 ml  Net     79 ml   Physical Exam: Appearance: morbidly obese woman lying in bed with eyes closed, asleep. [place] at 2.5 L. HEENT: AT/[place], TDC is now removed, former site is dressed and C/D/I Heart: RRR, no m/r/g Lungs: anterior exam: diminished breath sounds throughout but difficult exam due to body habitus Abdomen: obese abdomen, + BS present, nontender, no rebound/guarding/ridigity, abdomen is soft GU: foley in place with ~ 100cc of clear yellow urine Extremities: right BKA and left AKA, well-healed. Right arm atrophied in comparison to left arm, right arm has resting tremor Neurologic:A&Ox3, appropriate verbal responses  Tele: no events  Lab Results: Basic Metabolic Panel:  Recent Labs Lab 03/16/14 0504 03/17/14 0437 03/18/14 0311  NA 132* 128* 130*  K 4.0 3.9 3.7  CL 93* 90* 91*  CO2 23 23 22   GLUCOSE 114* 110* 87  BUN 29* 31* 35*  CREATININE 2.33* 2.53* 2.81*  CALCIUM 9.1 9.0 9.0  PHOS 2.7  --  2.8   CBC:  Recent Labs Lab 03/11/14 1145  03/14/14 0308 03/17/14 0437  WBC 5.4  < > 4.5 5.8  NEUTROABS 3.4  --   --   --   HGB 10.1*  < > 9.3* 9.5*  HCT 32.3*  < > 29.7* 29.8*  MCV 91.0  < > 92.2 88.2  PLT  98*  < > 187 280  < > = values in this interval not displayed.  Coagulation:  Recent Labs Lab 03/15/14 0438 03/16/14 0504 03/17/14 0437 03/18/14 0311  LABPROT 21.0* 25.1* 30.2* 30.8*  INR 1.81* 2.28* 2.88* 2.96*   Medications: I have reviewed the patient's current medications. Scheduled Meds: . allopurinol  100 mg Oral Daily  . antiseptic oral rinse  7 mL Mouth Rinse q12n4p  . atorvastatin  20 mg Oral QHS  . carvedilol  3.125 mg Oral BID WC  . chlorhexidine  15 mL Mouth Rinse BID  . famotidine  20 mg Oral Daily  . ferrous sulfate  325 mg Oral BID  . furosemide  160 mg Intravenous TID  . hydrALAZINE  10 mg Oral TID  . insulin aspart  0-5 Units Subcutaneous QHS  . insulin aspart  0-9 Units Subcutaneous TID WC  . isosorbide dinitrate  20 mg Oral TID  . levETIRAcetam  500 mg Oral BID  . methylphenidate  5 mg Oral Daily  . metolazone  5 mg Oral BID  . miconazole   Topical BID  . multivitamin with minerals  1 tablet Oral Daily  . polyethylene  glycol  17 g Oral Daily  . pregabalin  75 mg Oral Daily  . sertraline  150 mg Oral Daily  . sodium chloride  3 mL Intravenous Q12H  . vancomycin  125 mg Oral QID  . Warfarin - Pharmacist Dosing Inpatient   Does not apply q1800   Continuous Infusions:  PRN Meds:.LORazepam, promethazine, traMADol  Assessment/Plan:  [name] is a 53 yo woman with multiple co-morbidities who presented from dialysis with sharp, burning chest pain on 03/11/14. She had no recurrence of this pain, a negative i-stat troponin and unchanged EKG, but her initial XR showed extensive edema diffusely. She has continued to respond clinically to aggressive diuresis with lasix. In fact, at times during the hospitalization, she has not been using her supplemental oxygen, on which she was dependent at home (3L). Furthermore, her recently initiated dialysis has been stopped by nephrology, who question whether she was a good candidate from the start. It appears that she may  not need continued dialysis and her [facility] has been removed. That said, her urine output has been limited, so she will require close monitoring for this issue; her IV lasix was recently increased from 80 TID to 160 TID. Furthermore, she has been found to be infected with c difficile and is being treated with a course of oral vancomycin for high risk infection.  Acute on Chronic Systolic Heart Failure: Given her initial CXR and proBNP (52,920 on admission, up from 1850 two years ago), she was likely in volume overload due to acute exacerbation of heart failure. Echo on this admission with EF 35-40%. IV Lasix currently running through IV. Output recorded as only 401 mL yesterday. Weight has been recorded as ~23 pound increase, but doubt accuracy of weights. Repeat CXR 10/3 showed improvement in bilateral aeration with some mild residual right perihilar opacity (s/o mild lingering edema). Difficult to get a good exam for volume status (due to body habitus on lung exam and BKA/AKA for assessment of LE edema). Subjectively, patient is breathing more easily. Chest pain (at outside HD center) has not recurred and was described as stabbing and burning - perhaps related to untreated GERD (she has a GERD hx).  - Appreciate renal consult and [name] correspondence with patient's outpatient nephrologist ([name]) - Continuing increased dose of lasix 160 mg IV TID daily for aggressive diuresis; spoke with [name] about converting patient to 160mg  TID PO as outpatient - Adding metolazone today (5mg  BID) - Continue hydralazine and carvedilol; spironolactone has been d/c - Strict I's & O's - Continue nitro TID - Continue pepcid - Continue tele monitoring - Contact HF team for symptom management - Appreciate Palliative Care team consult today; also appreciate Chaplain visiting the patient - Arrange for [name] Palliative to follow as outpatient  Clostridium Difficile Infection:  The patient  complained of fever prior to admission (never recorded) and reported diarrhea. She feels the amount of diarrhea has decreased. She has not had fever or leukocytosis this admission. She is on day 6 of 14 of her PO vancomycin. - Continue PO vancomycin 125 mg four times per day. Guidelines are to continue this treatment for 10 to 14 days. This may be escalated to 500 mg at the same frequency if she does not show signs of improvement - Enteric precautions - CBC/fever monitoring; has remained afebrile - Cleared by SNF to return while still being treated - Monitor closely; Palliative initiating daily bowel regimen  CKD4: Nephrology feels the patient does not need  continued HD given relatively good GFR (though this has fallen to 25). Appreciate renal input. Of note, two nephrologists have now suggested that Ms. Hilario is a poor HD candidate while in this [facility]; both question why the HD was initiated. Patient and family agree to stopping it. Cr is trending up: 1.34-->2.13-->2.21-->2.33-->2.53-->2.81 this admission with aggressive diuresis. She likely needs continued diuresis and we will need strict I&Os to monitor progress. TDC is now removed. - Continue with lasix to IV 160 mg TID - Monitoring I/O and renal function  Hyponatremia:  Na 129-->132-->128-->130. She is AAO x3 and has not been confused or nauseous. Low Na is possibly due to Lasix or volume overload. - continue to diurese and monitor renal function  GERD: Has diagnosis of GERD, but does not take PPI. Initially put on PPI here, but switched to H2 blocker in context of c diff infection and risk of reinfection on long-term use PPI.  - Discontinued protonix - Continue with pepcid; consider sending her home on this medication   DMII: Stable. Most recent A1c in our records is 5.3% (but has been as high as 13.9%) with peripheral neuropathy and complications that led to her BKA and AKA. On 5 novolog with meals at home. Patient continues to complain  of phantom limb pain. - D/c home Lyrica as per Nephrology, but add back at much lower dose (25 mg QHS) - Initiate lidoderm patch to bilateral stumps (for neuropathy and phantom limb pain) - Tylenol TID for pain - D/c tramadol in context of seizure history - Continue ISS (sensitive renal), sliding scale at bedtime; consider liberalizing DM meds in context of excellent A1c and desire to avoid hypoglycemic events  Iron Deficiency Anemia: Patient's hemoglobin is at baseline (9.5). Chronically low iron. Patient received 510 mg feraheme as per renal recommendation.   Essential Hypertension: Stable - Continue home carvedilol, hydralazine; spironolactone d/c  History of Multiple CVAs: Residual deficits include cognitive slowing and left-sided weakness. Patient is on coumadin, atorvastatin.   Atrial Fibrillation: Documented in [facility] records. Patient also has a history of DVT. - Continue coumadin per pharmacy; finally therapeutic yesterday (and 2.96 today)  Morbid Obesity: bed bound at SNF  - Appreciate PT visit, SLP and OT attempted visit; recommend return to SNF  History of Seizure:  - Continue home keppra; some difficulty giving medications last night, but pharmacy recommended avoiding IV keppra due to risk for increased somnolence  Sleep Apnea:  - Continue home bipap at night   Depression and Anxiety: Has been using PRN ativan twice a day - D/c home sertraline, replace with cymbalta - Continue home ativan PRN   DVT Ppx:  - Patient is on coumadin (and is now therapeutic)  Diet: Speech assessment: dysphagia 3 (mechanical 3) diet; medications crushed with puree. All seated upright 90 degrees.  Dispo: Disposition is deferred at this time, awaiting improvement of current medical problems.  Anticipated discharge in approximately 0 day(s). Patient and family are requesting new SNF placement at [facility].  The patient does have a current PCP Rogene Houston, MD) and does not need  an [facility] follow-up appointment after discharge.  The patient does have transportation limitations that hinder transportation to clinic appointments.  .Services Needed at time of discharge: Y = Yes, Blank = No PT:   OT:   RN:   Equipment:   Other:     LOS: 7 days   Sherry Schmidt, MD 03/18/2014, 10:04 AM

## 2014-03-18 NOTE — Consult Note (Addendum)
Palliative Medicine Team at [facility]  Date: 03/18/2014   Patient Name: [name]  DOB: 1960/11/18  MRN: [id number]  Age / Sex: 53 y.o., female   PCP: Sherry Houston, MD Referring Physician: Michel Bickers, MD  Active Problems: Principal Problem:   Acute on chronic systolic heart failure Active Problems:   Type 2 diabetes mellitus with complication   Anxiety state   GERD   Morbid obesity   Anemia   Essential hypertension   Chronic kidney disease, stage 3   History of CVA (cerebrovascular accident)   History of seizure   Enteritis due to Clostridium difficile   HPI/Reason for Consultation: [name] is a 53 yo woman who 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 Her care has been fragmented and uncoordinated- she was seeing a [name] nephrologist who started her on hemodialysis about two weeks ago, she had several prior admissions at [facility] and appears that she has also had care at [facility] and in the [place]. Her family reports that she has had multiple hospitalization for shortness of breath and fluid retention the nursing home does not act or notice when she gets worsening edema. She has never had any formal heart failure mangement the focus has mostly been on her kidneys. Before SNF her PCP was [name] here in [name].   Her sister [name] reports that her existence at the SNF is poor- that she sleeps, eats and goes back to sleep, she is bed bound mostly, and doesn't participate in many activities. The only time she seems happy is when her family visits.   [name] tells me she is very tired. She complains of 7/10 pain in her "lower legs". She describes it as burning and like a throbbing tooth. She rarely verbalizes her complaint of pain. She has significant extremity contractures in her hands L>R.   Participants  in Discussion: Sister [name] and patient herself who has capacity.   Advance Directive: None    Code Status Orders        Start     Ordered   03/11/14 1924  Full code   Continuous     03/11/14 1923     Her sister tells me they have had conversations about "resucitation" before and YES she wants to be "brought back". She tells me that they have faith in GOD and would always "want everything done".  I have reviewed the medical record, interviewed the patient and family, and examined the patient. The following aspects are pertinent.  Past Medical History  Diagnosis Date  . HTN (hypertension)   . HLD (hyperlipidemia)   . History of methicillin resistant staphylococcus aureus (MRSA)   . CVA (cerebral infarction)   . Anemia   . Tachycardia   . Obesity   . Lower back pain     Chronic  . Idiopathic peripheral neuropathy     NOS  . DJD (degenerative joint disease)   . Headache(784.0)   . GERD (gastroesophageal reflux disease)   . Depression   . Anxiety   . Hypokalemia     resolved  . Cerebral infarct     left parietal and bilateral  . Esophagitis     distal  . Fluid overload     compensated  . Urinary retention   . H/O: pneumonia   .  History of bacteremia   . Surgery, other elective     of the ear  . Stroke     Hx of left frontoparietal stroke in 12/11. History of right brain stroke.  . Cardiomyopathy     Echocardiogram 04/23/11: Mild LVH, EF 30-35%, mild MR, mild LAE, mild-moderate RVE.  TEE 04/25/11: Moderate LVH, severe biventricular dysfunction, EF 20-25%, moderate to severe MR, moderate TR, biatrial enlargement, no PFO, negative bubble study  . Shortness of breath     with exertion   . Sleep apnea     no machine used  pt does not remember test for sleep apnea  . DM type 2 (diabetes mellitus, type 2)     type two   oral meds only   . 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 UTI (lower urinary tract infection) 10/19/11    had approx April 2013  .  Osteomyelitis   . DJD (degenerative joint disease)   . Urinary retention   . Pneumonia   . Peripheral vascular disease    History   Social History  . Marital Status: Divorced    Spouse Name: N/A    Number of Children: N/A  . Years of Education: N/A   Social History Main Topics  . Smoking status: Never Smoker   . Smokeless tobacco: Never Used  . Alcohol Use: Yes     Comment: rare on holidays  . Drug Use: No  . Sexual Activity: Not Currently   Other Topics Concern  . None   Social History Narrative   Divorced   One of 10 children   5 in [place] and was living with sister at time of CVA   Nonsmoker   Nondrinker   Family History  Problem Relation Age of Onset  . Coronary artery disease    . Diabetes    . Cancer Mother   . Diabetes Mother   . Heart disease Father   . Hyperlipidemia Father   . Hypertension Father   . Stroke Father   . Heart disease Brother   . Anesthesia problems Neg Hx   . Hypotension Neg Hx   . Malignant hyperthermia Neg Hx   . Pseudochol deficiency Neg Hx    Scheduled Meds: . allopurinol  100 mg Oral Daily  . antiseptic oral rinse  7 mL Mouth Rinse q12n4p  . atorvastatin  20 mg Oral QHS  . carvedilol  3.125 mg Oral BID WC  . chlorhexidine  15 mL Mouth Rinse BID  . famotidine  20 mg Oral Daily  . ferrous sulfate  325 mg Oral BID  . furosemide  160 mg Intravenous TID  . hydrALAZINE  10 mg Oral TID  . insulin aspart  0-5 Units Subcutaneous QHS  . insulin aspart  0-9 Units Subcutaneous TID WC  . isosorbide dinitrate  20 mg Oral TID  . levETIRAcetam  500 mg Oral BID  . methylphenidate  5 mg Oral Daily  . metolazone  5 mg Oral BID  . miconazole   Topical BID  . multivitamin with minerals  1 tablet Oral Daily  . polyethylene glycol  17 g Oral Daily  . pregabalin  75 mg Oral Daily  . sertraline  150 mg Oral Daily  . sodium chloride  3 mL Intravenous Q12H  . vancomycin  125 mg Oral QID  . Warfarin - Pharmacist Dosing Inpatient   Does not apply  q1800   Continuous Infusions:  PRN Meds:.LORazepam, promethazine, traMADol Allergies  Allergen Reactions  . Ace Inhibitors Shortness Of Breath  . Latex Hives  . Lisinopril Other (See Comments)    Per MAR  . Vancomycin Other (See Comments)    Per MAR   CBC:    Component Value Date/Time   WBC 5.8 03/17/2014 0437   HGB 9.5* 03/17/2014 0437   HCT 29.8* 03/17/2014 0437   PLT 280 03/17/2014 0437   MCV 88.2 03/17/2014 0437   NEUTROABS 3.4 03/11/2014 1145   LYMPHSABS 1.0 03/11/2014 1145   MONOABS 0.7 03/11/2014 1145   EOSABS 0.2 03/11/2014 1145   BASOSABS 0.0 03/11/2014 1145   Comprehensive Metabolic Panel:    Component Value Date/Time   NA 130* 03/18/2014 0311   K 3.7 03/18/2014 0311   CL 91* 03/18/2014 0311   CO2 22 03/18/2014 0311   BUN 35* 03/18/2014 0311   CREATININE 2.81* 03/18/2014 0311   GLUCOSE 87 03/18/2014 0311   CALCIUM 9.0 03/18/2014 0311   AST 13 03/12/2014 0500   ALT 6 03/12/2014 0500   ALKPHOS 105 03/12/2014 0500   BILITOT 0.9 03/12/2014 0500   PROT 6.9 03/12/2014 0500   ALBUMIN 2.7* 03/18/2014 0311    Vital Signs: BP 142/82  Pulse 83  Temp(Src) 97.2 F (36.2 C) (Oral)  Resp 18  Ht 5' (1.524 m)  Wt 121.11 kg (267 lb)  BMI 52.14 kg/m2  SpO2 100% Filed Weights   03/16/14 0523 03/17/14 0655 03/18/14 0548  Weight: 121.519 kg (267 lb 14.4 oz) 121 kg (266 lb 12.1 oz) 121.11 kg (267 lb)   10/07 0701 - 10/08 0700 In: 60 [P.O.:490] Out: 401 [Urine:401]  Physical Exam:  Drowsy, very flat affect, doesn't make good eye contact but answers questions when directly asked. +L>R hand contractures, poor dentition, obese, dry lips and mouth, abdomen is soft and not tender. Bilateral amputations.  Summary of Established Goals of Care and Medical Treatment Preferences      Recommendations:  1. Code Status/ACP: FULL CODE, will require ongoing conversation   I requested Chaplain to complete ACP and to fill out HCPOA form- Katia requests that [name] be her HCPOA. 2. Scope of  Treatment:   Avoid re-hospitalization  Aggressive treatment of all reversible conditions  Maximize Heart Failure Management  No Hemodialysis  Rehabilitation- she has dual medicare/medicaid  Treatment of her pain and other symptoms of weakness and depression  3. Symptom Management:   Because of her kidney disease she will need careful management of her medications for symptom management   1. Phantom Limb Pain: Multiple Pain Pathways involved -neuropathic primarily.  Lidoderm Patch to bilateral stumps  Start Cymbalta low dose- dual benefit, stop sertraline  Preferred opiate would be Fentanyl or Hydromorphone in CKD-will order PRN for moderate-severe pain  Start with a VERY low dose of PM Lyrica- 25mg  qhs (she had been getting very high doses previously but this may be contributing to her sedation  Scheduled Tylenol TID  Would not advise using Tramadol in setting of her seizure disorder, may also be causing her nausea 2. Weakness/Asthenia: Multifactorial  She appears to be on Ritalin but unclear when this was started and not documentation of effects- while psychostims can be effective for Asthenia in chronic disease I recommend being very cautious with her CHF, history of A. Fib, and PVD-vasoconstriction could induce pain.  I suspect some of this is medication SE and metabolic - mobility, encouragement and pain control with the SSRI may help. 3. Chronic Disease Management:  Recommend a CHF program evaluation- maximize her CHF meds and monitoring to prevent re-admission.  No Hemodialysis-maximize oral diuretics, daily weights  Her A1C is 5.3- consider liberalizing PO options for comfort-avoid hypoglycemia  Needs ongoing goals of care discussions  I completed a comprehensive medication review to minimize sedation and side effects but to maximize her pain and symptom control. Discontinued unnecessary medications like multivitamins, iron and tele monitoring.   4. Palliative  Prophylaxis:   Daily bowel regimen ordered.  5. Disposition:   Patient is refusing to go back to [facility], her sister [name] is requesting [facility]. I have also recommended that [place] Follow Her at SNF- please make that recommendation BOLD in the discharge summary.   Time: 9AM-10-30AM  Time Total: 90 minutes Greater than 50%  of this time was spent counseling and coordinating care related to the above assessment and plan.  Signed by: Roma Schanz, DO  03/18/2014, 10:17 AM  Please contact Palliative Medicine Team phone at [phone] for questions and concerns.

## 2014-03-18 NOTE — Progress Notes (Signed)
Chart reviewed.  Patient with end-stage HF and Renal Failure which required HD in house but no longer wanting to continue with dialysis. Being discharged with Palliative Care. We are asked to weigh in on discharge diuretic regimen. Renal has placed on lasix 160 IV tid and metolazone and u/o still poor.   At this point, given her renal failure I am not sure that any diuretic regimen we recommend will be effective.   Can try demadex 100 bid with metolazone as needed up to 5 bid.  Lynsee Wands,MD 3:21 PM

## 2014-03-18 NOTE — Progress Notes (Signed)
CSW (Clinical Education officer, museum) notified that pt and pt sister are wanting to switch facilities. CSW sent clinicals to requested facility. Will update pt and pt sister when facility responds.  [place], [place]

## 2014-03-18 NOTE — Progress Notes (Signed)
CSW (Clinical Education officer, museum) notified that pt awaiting consult but could potentially be ready for dc later today. CSW spoke with Levada Dy [phone]) who confirmed that if paperwork is faxed in by 7pm pt can dc today. Fax number provided was [phone]. Facility does request that report be called prior to 5pm though (Phone number [phone]). CSW notified pt nurse and paged MD to notify.  [place], [place]

## 2014-03-18 NOTE — Progress Notes (Signed)
S: Somnolent this AM.  On BiPap O:BP 142/82  Pulse 83  Temp(Src) 97.2 F (36.2 C) (Oral)  Resp 18  Ht 5' (1.524 m)  Wt 121.11 kg (267 lb)  BMI 52.14 kg/m2  SpO2 100%  Intake/Output Summary (Last 24 hours) at 03/18/14 0721 Last data filed at 03/18/14 0500  Gross per 24 hour  Intake    490 ml  Output    401 ml  Net     89 ml   Weight change: 0.11 kg (3.9 oz) [id number] eyes but not verbal CVS:RRR Resp:Decreased BS throughout Abd:+ BS NT ND  + subQ edema abd wall Ext: +++ edema  No overt lesions on Rt stump NEURO: Somnolent   . allopurinol  100 mg Oral Daily  . antiseptic oral rinse  7 mL Mouth Rinse q12n4p  . atorvastatin  20 mg Oral QHS  . carvedilol  3.125 mg Oral BID WC  . chlorhexidine  15 mL Mouth Rinse BID  . famotidine  20 mg Oral Daily  . ferrous sulfate  325 mg Oral BID  . furosemide  160 mg Intravenous TID  . hydrALAZINE  10 mg Oral TID  . insulin aspart  0-5 Units Subcutaneous QHS  . insulin aspart  0-9 Units Subcutaneous TID WC  . isosorbide dinitrate  20 mg Oral TID  . levETIRAcetam  500 mg Oral BID  . methylphenidate  5 mg Oral Daily  . miconazole   Topical BID  . multivitamin with minerals  1 tablet Oral Daily  . polyethylene glycol  17 g Oral Daily  . pregabalin  75 mg Oral BID  . sertraline  150 mg Oral Daily  . sodium chloride  3 mL Intravenous Q12H  . vancomycin  125 mg Oral QID  . Warfarin - Pharmacist Dosing Inpatient   Does not apply q1800   No results found. BMET    Component Value Date/Time   NA 130* 03/18/2014 0311   K 3.7 03/18/2014 0311   CL 91* 03/18/2014 0311   CO2 22 03/18/2014 0311   GLUCOSE 87 03/18/2014 0311   BUN 35* 03/18/2014 0311   CREATININE 2.81* 03/18/2014 0311   CALCIUM 9.0 03/18/2014 0311   GFRNONAA 18* 03/18/2014 0311   GFRAA 21* 03/18/2014 0311   CBC    Component Value Date/Time   WBC 5.8 03/17/2014 0437   RBC 3.38* 03/17/2014 0437   RBC 3.22* 03/14/2014 0308   HGB 9.5* 03/17/2014 0437   HCT 29.8* 03/17/2014 0437   PLT  280 03/17/2014 0437   MCV 88.2 03/17/2014 0437   MCH 28.1 03/17/2014 0437   MCHC 31.9 03/17/2014 0437   RDW 19.2* 03/17/2014 0437   LYMPHSABS 1.0 03/11/2014 1145   MONOABS 0.7 03/11/2014 1145   EOSABS 0.2 03/11/2014 1145   BASOSABS 0.0 03/11/2014 1145     Assessment: 1. CKD 4/5 2. DM 3. Anemia and Fe def, received 510mg  feraheme 4. HTN 5. Hx CVA 6. C Diff 7. Volume overload Plan: 1. Note plan for palliative care.  I think comfort care should be the primary goal 2. Will Add metolazone 3. Decrease dose of lyrica  Shalynn Jorstad T

## 2014-03-18 NOTE — Progress Notes (Signed)
Chaplain responded to page that pt needed assistance w/ advanced directive. Chaplain acquired form for pt's sister, and let them know to call if they have questions and when they are ready to sign.    03/18/14 1021  Clinical Encounter Type  Visited With Patient and family together  Visit Type Initial;Other (Comment) (advanced directive)  Referral From Nurse  Consult/Referral To [place] (For Healthcare)  Does patient have an advance directive? No  Would patient like information on creating an advanced directive? Yes - Educational materials given    Vanetta Mulders 03/18/2014 10:23 AM

## 2014-03-18 NOTE — Progress Notes (Addendum)
Clinical Social Work Department CLINICAL SOCIAL WORK PLACEMENT NOTE 03/18/2014  Patient:  [name], [name]  Account Number:  [id number] Admit date:  03/11/2014  Clinical Social Worker:  Berton Mount, Latanya Presser  Date/time:  03/18/2014 10:20 AM  Clinical Social Work is seeking post-discharge placement for this patient at the following level of care:   SKILLED NURSING   (*CSW will update this form in Epic as items are completed)   03/18/2014  Patient/family provided with [place] Department of Clinical Social Work's list of facilities offering this level of care within the geographic area requested by the patient (or if unable, by the patient's family).  03/18/2014  Patient/family informed of their freedom to choose among providers that offer the needed level of care, that participate in Medicare, Medicaid or managed care program needed by the patient, have an available bed and are willing to accept the patient.  03/18/2014  Patient/family informed of [facility]' ownership interest in [facility], as well as of the fact that they are under no obligation to receive care at this facility.  PASARR submitted to EDS on  PASARR number received on   FL2 transmitted to all facilities in geographic area requested by pt/family on  03/18/2014 FL2 transmitted to all facilities within larger geographic area on   Patient informed that his/her managed care company has contracts with or will negotiate with  certain facilities, including the following:     Patient/family informed of bed offers received:  03/18/2014 Patient chooses bed at Returning to [facility] Physician recommends and patient chooses bed at    Patient to be transferred to  [facility] on  03/19/2014 Patient to be transferred to facility by [facility] Patient and family notified of transfer on 03/19/2014 Name of family member notified:  Beacher May  The following physician request were entered in Epic: Physician  Request  Please sign FL2.    Additional Comments: Preexisting [place], [place]

## 2014-03-18 NOTE — Progress Notes (Addendum)
ANTICOAGULATION CONSULT NOTE - Follow Up Consult  Pharmacy Consult for warfarin Indication: h/o CVA  Allergies  Allergen Reactions  . Ace Inhibitors Shortness Of Breath  . Latex Hives  . Lisinopril Other (See Comments)    Per MAR  . Vancomycin Other (See Comments)    Per [facility]    Patient Measurements: Height: 5' (152.4 cm) Weight: 267 lb (121.11 kg) IBW/kg (Calculated) : 45.5  Vital Signs: Temp: 97.2 F (36.2 C) (10/08 0548) Temp Source: Oral (10/08 0548) BP: 142/82 mmHg (10/08 0548) Pulse Rate: 83 (10/08 0548)  Labs:  Recent Labs  03/16/14 0504 03/17/14 0437 03/18/14 0311  HGB  --  9.5*  --   HCT  --  29.8*  --   PLT  --  280  --   LABPROT 25.1* 30.2* 30.8*  INR 2.28* 2.88* 2.96*  CREATININE 2.33* 2.53* 2.81*    Estimated Creatinine Clearance: 28.3 ml/min (by C-G formula based on Cr of 2.81).   Medications:  Scheduled:  . allopurinol  100 mg Oral Daily  . antiseptic oral rinse  7 mL Mouth Rinse q12n4p  . atorvastatin  20 mg Oral QHS  . carvedilol  3.125 mg Oral BID WC  . chlorhexidine  15 mL Mouth Rinse BID  . famotidine  20 mg Oral Daily  . ferrous sulfate  325 mg Oral BID  . furosemide  160 mg Intravenous TID  . hydrALAZINE  10 mg Oral TID  . insulin aspart  0-5 Units Subcutaneous QHS  . insulin aspart  0-9 Units Subcutaneous TID WC  . isosorbide dinitrate  20 mg Oral TID  . levETIRAcetam  500 mg Oral BID  . methylphenidate  5 mg Oral Daily  . metolazone  5 mg Oral BID  . miconazole   Topical BID  . multivitamin with minerals  1 tablet Oral Daily  . polyethylene glycol  17 g Oral Daily  . pregabalin  75 mg Oral Daily  . sertraline  150 mg Oral Daily  . sodium chloride  3 mL Intravenous Q12H  . vancomycin  125 mg Oral QID  . Warfarin - Pharmacist Dosing Inpatient   Does not apply q1800    Assessment: 53 yo f admitted on 10/1 after chest pain during HD. Patient is on warfarin PTA at a dose of 6 mg PO daily for history of a CVA. Pharmacy is  consulted to dose and adjust warfarin while inpatient. Patient's INR on admission was 1.41.  Patient has received three doses of 10 mg (10/2-10/4) with INR up to 2.96 (home dose of 6mg  restarted 10/5 and dose was missed on 10/7).  Noted palliative care to meet with family.  Goal of Therapy:  INR 2-3 Monitor platelets by anticoagulation protocol: Yes   Plan:  Warfarin 6 mg tonight x 1 Daily PT/INR  Hildred Laser, Pharm D 03/18/2014 10:24 AM

## 2014-03-18 NOTE — Progress Notes (Signed)
CSW (Clinical Education officer, museum) spoke with pt sister and notified that [facility] unable to offer a bed. CSW inquired if they would like clinicals to be sent to any other facilities. Pt sister informed CSW that she would like for pt to dc back to [facility] and she will continue to look for facilities herself. Pt sister would like more time to look at alternate facilities. She notified CSW that there are no urgent concerns with current facility so it will not be an issue for pt to return.  [place], [place]

## 2014-03-19 LAB — PROTIME-INR
INR: 3.27 — ABNORMAL HIGH (ref 0.00–1.49)
Prothrombin Time: 33.3 seconds — ABNORMAL HIGH (ref 11.6–15.2)

## 2014-03-19 LAB — GLUCOSE, CAPILLARY
GLUCOSE-CAPILLARY: 113 mg/dL — AB (ref 70–99)
GLUCOSE-CAPILLARY: 113 mg/dL — AB (ref 70–99)

## 2014-03-19 LAB — BASIC METABOLIC PANEL
ANION GAP: 13 (ref 5–15)
BUN: 36 mg/dL — ABNORMAL HIGH (ref 6–23)
CO2: 23 mEq/L (ref 19–32)
Calcium: 8.7 mg/dL (ref 8.4–10.5)
Chloride: 91 mEq/L — ABNORMAL LOW (ref 96–112)
Creatinine, Ser: 2.85 mg/dL — ABNORMAL HIGH (ref 0.50–1.10)
GFR, EST AFRICAN AMERICAN: 21 mL/min — AB (ref >90)
GFR, EST NON AFRICAN AMERICAN: 18 mL/min — AB (ref >90)
Glucose, Bld: 99 mg/dL (ref 70–99)
POTASSIUM: 3.9 meq/L (ref 3.7–5.3)
SODIUM: 127 meq/L — AB (ref 137–147)

## 2014-03-19 MED ORDER — HYDROMORPHONE HCL 2 MG PO TABS: 2.0000 mg | ORAL_TABLET | ORAL | Status: DC | PRN

## 2014-03-19 MED ORDER — VANCOMYCIN 50 MG/ML ORAL SOLUTION: 125.0000 mg | Freq: Four times a day (QID) | ORAL | Status: DC

## 2014-03-19 MED ORDER — GI COCKTAIL ~~LOC~~: 30.0000 mL | Freq: Two times a day (BID) | ORAL | Status: AC | PRN

## 2014-03-19 MED ORDER — PREGABALIN 25 MG PO CAPS: 25.0000 mg | ORAL_CAPSULE | Freq: Every day | ORAL | Status: AC

## 2014-03-19 MED ORDER — LIDOCAINE 5 % EX PTCH: 2.0000 | MEDICATED_PATCH | CUTANEOUS | Status: DC

## 2014-03-19 MED ORDER — SENNOSIDES-DOCUSATE SODIUM 8.6-50 MG PO TABS: 1.0000 | ORAL_TABLET | Freq: Every evening | ORAL | Status: AC | PRN

## 2014-03-19 MED ORDER — WARFARIN SODIUM 6 MG PO TABS: 6.0000 mg | ORAL_TABLET | Freq: Every day | ORAL | Status: DC

## 2014-03-19 MED ORDER — FAMOTIDINE 10 MG PO TABS: 10.0000 mg | ORAL_TABLET | Freq: Two times a day (BID) | ORAL | Status: DC

## 2014-03-19 MED ORDER — DIAZEPAM 2 MG PO TABS: 2.0000 mg | ORAL_TABLET | Freq: Four times a day (QID) | ORAL | Status: DC | PRN

## 2014-03-19 MED ORDER — METOLAZONE 5 MG PO TABS: 5.0000 mg | ORAL_TABLET | Freq: Two times a day (BID) | ORAL | Status: AC

## 2014-03-19 MED ORDER — FUROSEMIDE 80 MG PO TABS: 160.0000 mg | ORAL_TABLET | Freq: Three times a day (TID) | ORAL | Status: DC

## 2014-03-19 MED ORDER — DULOXETINE HCL 20 MG PO CPEP: 20.0000 mg | ORAL_CAPSULE | Freq: Every day | ORAL | Status: AC

## 2014-03-19 MED ORDER — ACETAMINOPHEN 325 MG PO TABS: 650.0000 mg | ORAL_TABLET | Freq: Three times a day (TID) | ORAL | Status: AC

## 2014-03-19 NOTE — Discharge Summary (Signed)
Name: [name] MRN: [id number] DOB: Aug 23, 1960 53 y.o. PCP: Rogene Houston, MD  Date of Admission: 03/11/2014 10:52 AM Date of Discharge: 03/19/2014 Attending Physician: Michel Bickers, MD  Discharge Diagnosis: Principal Problem:   Acute on chronic systolic heart failure Active Problems:   Enteritis due to Clostridium difficile   Type 2 diabetes mellitus with complication   Anxiety state   GERD   Morbid obesity   Anemia   Essential hypertension   Chronic kidney disease, stage 3   History of CVA (cerebrovascular accident)   PVD (peripheral vascular disease)   History of seizure   Advanced directives, counseling/discussion   Phantom limb syndrome   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  Discharge Medications:   Medication List    STOP taking these medications       ALPRAZolam 0.25 MG tablet  Commonly known as:  XANAX     CALCIUM + D PO     multivitamin with minerals Tabs tablet     polyethylene glycol packet  Commonly known as:  MIRALAX / GLYCOLAX     sertraline 50 MG tablet  Commonly known as:  ZOLOFT     spironolactone 12.5 mg Tabs tablet  Commonly known as:  ALDACTONE     torsemide 20 MG tablet  Commonly known as:  DEMADEX     traMADol 50 MG tablet  Commonly known as:  ULTRAM     traZODone 50 MG tablet  Commonly known as:  DESYREL      TAKE these medications       acetaminophen 325 MG tablet  Commonly known as:  TYLENOL  Take 2 tablets (650 mg total) by mouth 3 (three) times daily.     allopurinol 100 MG tablet  Commonly known as:  ZYLOPRIM  Take 100 mg by mouth daily.     atorvastatin 20 MG tablet  Commonly known as:  LIPITOR  Take 20 mg by mouth at bedtime.     carvedilol 3.125 MG tablet  Commonly known as:  COREG  Take 3.125 mg by mouth 2 (two) times daily with a meal.     diazepam 2 MG tablet  Commonly known as:  VALIUM  Take 1 tablet (2 mg total) by mouth every 6 (six) hours as needed for anxiety or muscle spasms (sleep).     DULoxetine 20 MG  capsule  Commonly known as:  CYMBALTA  Take 1 capsule (20 mg total) by mouth daily.     famotidine 10 MG tablet  Commonly known as:  PEPCID  Take 1 tablet (10 mg total) by mouth 2 (two) times daily.     feeding supplement (PRO-STAT SUGAR FREE 64) Liqd  Take 30 mLs by mouth daily.     ferrous sulfate 325 (65 FE) MG tablet  Take 325 mg by mouth 2 (two) times daily.     furosemide 80 MG tablet  Commonly known as:  LASIX  Take 2 tablets (160 mg total) by mouth 3 (three) times daily.     gi cocktail Susp suspension  Take 30 mLs by mouth 2 (two) times daily as needed for indigestion (Chest Pain). Shake well.     hydrALAZINE 10 MG tablet  Commonly known as:  APRESOLINE  Take 10 mg by mouth 3 (three) times daily.     HYDROmorphone 2 MG tablet  Commonly known as:  DILAUDID  Take 1 tablet (2 mg total) by mouth every 4 (four) hours as needed for moderate pain or severe pain.  isosorbide dinitrate 20 MG tablet  Commonly known as:  ISORDIL  Take 20 mg by mouth 3 (three) times daily.     levETIRAcetam 500 MG tablet  Commonly known as:  KEPPRA  Take 500 mg by mouth 2 (two) times daily.     lidocaine 5 %  Commonly known as:  LIDODERM  Place 2 patches onto the skin daily. Remove & Discard patch within 12 hours or as directed by MD     metolazone 5 MG tablet  Commonly known as:  ZAROXOLYN  Take 1 tablet (5 mg total) by mouth 2 (two) times daily.     MICONAZOLE NITRATE EX  Apply 1 application topically 2 (two) times daily.     pregabalin 25 MG capsule  Commonly known as:  LYRICA  Take 1 capsule (25 mg total) by mouth at bedtime.     senna-docusate 8.6-50 MG per tablet  Commonly known as:  Senokot-S  Take 1 tablet by mouth at bedtime as needed for mild constipation.     tiZANidine 4 MG tablet  Commonly known as:  ZANAFLEX  Take 4 mg by mouth every 8 (eight) hours as needed for muscle spasms.     vancomycin 50 mg/mL oral solution  Commonly known as:  VANCOCIN  Take 2.5 mLs  (125 mg total) by mouth 4 (four) times daily.     warfarin 6 MG tablet  Commonly known as:  COUMADIN  Take 1 tablet (6 mg total) by mouth daily.  Start taking on:  03/21/2014        Disposition and follow-up:   [name] was discharged from [facility] in Stable condition.  At the hospital follow up visit please address:  1.  [name] was found to be in acute on chronic systolic heart failure during this hospitalization. Her diuresis was started here and needs to be continued at her SNF. Please continue 160 mg lasix TID and 5 mg metolazone BID. Please take daily weights to monitor.  She was also found to be infected with c difficile. Her oral vancomycin 125 mg four times per day is on day 7/14. It should be continued until 10/16. She needs 2 more doses today (10/9).  Finally, the patient needs continued follow up with palliative care. Recommendation for [facility] to follow at SNF.  Recommendation is for no further HD.  Please consider liberalizing home diabetes medications in context of excellent 5.3% and desire to avoid hypoglycemic events.  Speech therapy recommended dysphagia 3 diet.  Finally, coumadin was subtherapeutic on admission and supratherapeutic today. Please hold coumadin 10/9 and 10/10, then restart at $RemoveBe'6mg'mMIzrOAbW$ /day on 10/11.  2.  Labs / imaging needed at time of follow-up: creatinine and sodium  3.  Pending labs/ test needing follow-up: none  Follow-up Appointments: Rogene Houston, MD at [facility] has plans to see the patient today  Discharge Instructions:  Heart Failure Heart failure is a condition in which the heart has trouble pumping blood. This means your heart does not pump blood efficiently for your body to work well. In some cases of heart failure, fluid may back up into your lungs or you may have swelling (edema) in your lower legs. Heart failure is usually a long-term (chronic) condition. It is important for you to  take good care of yourself and follow your health care provider's treatment plan. CAUSES  Some health conditions can cause heart failure. Those health conditions include:  High blood pressure (hypertension). Hypertension  causes the heart muscle to work harder than normal. When pressure in the blood vessels is high, the heart needs to pump (contract) with more force in order to circulate blood throughout the body. High blood pressure eventually causes the heart to become stiff and weak.  Coronary artery disease (CAD). CAD is the buildup of cholesterol and fat (plaque) in the arteries of the heart. The blockage in the arteries deprives the heart muscle of oxygen and blood. This can cause chest pain and may lead to a heart attack. High blood pressure can also contribute to CAD.  Heart attack (myocardial infarction). A heart attack occurs when one or more arteries in the heart become blocked. The loss of oxygen damages the muscle tissue of the heart. When this happens, part of the heart muscle dies. The injured tissue does not contract as well and weakens the heart's ability to pump blood.  Abnormal heart valves. When the heart valves do not open and close properly, it can cause heart failure. This makes the heart muscle pump harder to keep the blood flowing.  Heart muscle disease (cardiomyopathy or myocarditis). Heart muscle disease is damage to the heart muscle from a variety of causes. These can include drug or alcohol abuse, infections, or unknown reasons. These can increase the risk of heart failure.  Lung disease. Lung disease makes the heart work harder because the lungs do not work properly. This can cause a strain on the heart, leading it to fail.  Diabetes. Diabetes increases the risk of heart failure. High blood sugar contributes to high fat (lipid) levels in the blood. Diabetes can also cause slow damage to tiny blood vessels that carry important nutrients to the heart muscle. When the heart  does not get enough oxygen and food, it can cause the heart to become weak and stiff. This leads to a heart that does not contract efficiently.  Other conditions can contribute to heart failure. These include abnormal heart rhythms, thyroid problems, and low blood counts (anemia). Certain unhealthy behaviors can increase the risk of heart failure, including:  Being overweight.  Smoking or chewing tobacco.  Eating foods high in fat and cholesterol.  Abusing illicit drugs or alcohol.  Lacking physical activity. SYMPTOMS  Heart failure symptoms may vary and can be hard to detect. Symptoms may include:  Shortness of breath with activity, such as climbing stairs.  Persistent cough.  Swelling of the feet, ankles, legs, or abdomen.  Unexplained weight gain.  Difficulty breathing when lying flat (orthopnea).  Waking from sleep because of the need to sit up and get more air.  Rapid heartbeat.  Fatigue and loss of energy.  Feeling light-headed, dizzy, or close to fainting.  Loss of appetite.  Nausea.  Increased urination during the night (nocturia). DIAGNOSIS  A diagnosis of heart failure is based on your history, symptoms, physical examination, and diagnostic tests. Diagnostic tests for heart failure may include:  Echocardiography.  Electrocardiography.  Chest X-ray.  Blood tests.  Exercise stress test.  Cardiac angiography.  Radionuclide scans. TREATMENT  Treatment is aimed at managing the symptoms of heart failure. Medicines, behavioral changes, or surgical intervention may be necessary to treat heart failure.  Medicines to help treat heart failure may include:  Angiotensin-converting enzyme (ACE) inhibitors. This type of medicine blocks the effects of a blood protein called angiotensin-converting enzyme. ACE inhibitors relax (dilate) the blood vessels and help lower blood pressure.  Angiotensin receptor blockers (ARBs). This type of medicine blocks the actions  of a  blood protein called angiotensin. Angiotensin receptor blockers dilate the blood vessels and help lower blood pressure.  Water pills (diuretics). Diuretics cause the kidneys to remove salt and water from the blood. The extra fluid is removed through urination. This loss of extra fluid lowers the volume of blood the heart pumps.  Beta blockers. These prevent the heart from beating too fast and improve heart muscle strength.  Digitalis. This increases the force of the heartbeat.  Healthy behavior changes include:  Obtaining and maintaining a healthy weight.  Stopping smoking or chewing tobacco.  Eating heart-healthy foods.  Limiting or avoiding alcohol.  Stopping illicit drug use.  Physical activity as directed by your health care provider.  Surgical treatment for heart failure may include:  A procedure to open blocked arteries, repair damaged heart valves, or remove damaged heart muscle tissue.  A pacemaker to improve heart muscle function and control certain abnormal heart rhythms.  An internal cardioverter defibrillator to treat certain serious abnormal heart rhythms.  A left ventricular assist device (LVAD) to assist the pumping ability of the heart. HOME CARE INSTRUCTIONS   Take medicines only as directed by your health care provider. Medicines are important in reducing the workload of your heart, slowing the progression of heart failure, and improving your symptoms.  Do not stop taking your medicine unless directed by your health care provider.  Do not skip any dose of medicine.  Refill your prescriptions before you run out of medicine. Your medicines are needed every day.  Engage in moderate physical activity if directed by your health care provider. Moderate physical activity can benefit some people. The elderly and people with severe heart failure should consult with a health care provider for physical activity recommendations.  Eat heart-healthy foods. Food  choices should be free of trans fat and low in saturated fat, cholesterol, and salt (sodium). Healthy choices include fresh or frozen fruits and vegetables, fish, lean meats, legumes, fat-free or low-fat dairy products, and whole grain or high fiber foods. Talk to a dietitian to learn more about heart-healthy foods.  Limit sodium if directed by your health care provider. Sodium restriction may reduce symptoms of heart failure in some people. Talk to a dietitian to learn more about heart-healthy seasonings.  Use healthy cooking methods. Healthy cooking methods include roasting, grilling, broiling, baking, poaching, steaming, or stir-frying. Talk to a dietitian to learn more about healthy cooking methods.  Limit fluids if directed by your health care provider. Fluid restriction may reduce symptoms of heart failure in some people.  Weigh yourself every day. Daily weights are important in the early recognition of excess fluid. You should weigh yourself every morning after you urinate and before you eat breakfast. Wear the same amount of clothing each time you weigh yourself. Record your daily weight. Provide your health care provider with your weight record.  Monitor and record your blood pressure if directed by your health care provider.  Check your pulse if directed by your health care provider.  Lose weight if directed by your health care provider. Weight loss may reduce symptoms of heart failure in some people.  Stop smoking or chewing tobacco. Nicotine makes your heart work harder by causing your blood vessels to constrict. Do not use nicotine gum or patches before talking to your health care provider.  Keep all follow-up visits as directed by your health care provider. This is important.  Limit alcohol intake to no more than 1 drink per day for nonpregnant women and 2 drinks  per day for men. One drink equals 12 ounces of beer, 5 ounces of wine, or 1 ounces of hard liquor. Drinking more than  that is harmful to your heart. Tell your health care provider if you drink alcohol several times a week. Talk with your health care provider about whether alcohol is safe for you. If your heart has already been damaged by alcohol or you have severe heart failure, drinking alcohol should be stopped completely.  Stop illicit drug use.  Stay up-to-date with immunizations. It is especially important to prevent respiratory infections through current pneumococcal and influenza immunizations.  Manage other health conditions such as hypertension, diabetes, thyroid disease, or abnormal heart rhythms as directed by your health care provider.  Learn to manage stress.  Plan rest periods when fatigued.  Learn strategies to manage high temperatures. If the weather is extremely hot:  Avoid vigorous physical activity.  Use air conditioning or fans or seek a cooler location.  Avoid caffeine and alcohol.  Wear loose-fitting, lightweight, and light-colored clothing.  Learn strategies to manage cold temperatures. If the weather is extremely cold:  Avoid vigorous physical activity.  Layer clothes.  Wear mittens or gloves, a hat, and a scarf when going outside.  Avoid alcohol.  Obtain ongoing education and support as needed.  Participate in or seek rehabilitation as needed to maintain or improve independence and quality of life. SEEK MEDICAL CARE IF:   Your weight increases by 03 lb/1.4 kg in 1 day or 05 lb/2.3 kg in a week.  You have increasing shortness of breath that is unusual for you.  You are unable to participate in your usual physical activities.  You tire easily.  You cough more than normal, especially with physical activity.  You have any or more swelling in areas such as your hands, feet, ankles, or abdomen.  You are unable to sleep because it is hard to breathe.  You feel like your heart is beating fast (palpitations).  You become dizzy or light-headed upon standing  up. SEEK IMMEDIATE MEDICAL CARE IF:   You have difficulty breathing.  There is a change in mental status such as decreased alertness or difficulty with concentration.  You have a pain or discomfort in your chest.  You have an episode of fainting (syncope). MAKE SURE YOU:   Understand these instructions.  Will watch your condition.  Will get help right away if you are not doing well or get worse. Document Released: 05/28/2005 Document Revised: 10/12/2013 Document Reviewed: 06/27/2012 [facility] Patient Information 2015 [place], [place]. This information is not intended to replace advice given to you by your health care provider. Make sure you discuss any questions you have with your health care provider.  Clostridium Difficile Infection Clostridium difficile (C. difficile) is a bacteria found in the intestinal tract or colon. Under certain conditions, it causes diarrhea and sometimes severe disease. The severe form of the disease is known as pseudomembranous colitis (often called C. difficile colitis). This disease can damage the lining of the colon or cause the colon to become enlarged (toxic megacolon). CAUSES Your colon normally contains many different bacteria, including C. difficile. The balance of bacteria in your colon can change during illness. This is especially true when you take antibiotic medicine. Taking antibiotics may allow the C. difficile to grow, multiply excessively, and make a toxin that then causes illness. The elderly and people with certain medical conditions have a greater risk of getting C. difficile infections. SYMPTOMS  Watery diarrhea.  Fever.  Fatigue.  Loss of appetite.  Nausea.  Abdominal swelling, pain, or tenderness.  Dehydration. DIAGNOSIS Your symptoms may make your caregiver suspect a C. difficile infection, especially if you have used antibiotics in the preceding weeks. However, there are only 2 ways to know for certain whether you have a C.  difficile infection:  A lab test that finds the toxin in your stool.  The specific appearance of an abnormality (pseudomembrane) in your colon. This can only be seen by doing a sigmoidoscopy or colonoscopy. These procedures involve passing an instrument through your rectum to look at the inside of your colon. Your caregiver will help determine if these tests are necessary. TREATMENT  Most people are successfully treated with one of two specific antibiotics, usually given by mouth. Other antibiotics you are receiving are stopped if possible.  Intravenous (IV) fluids and correction of electrolyte imbalance may be necessary.  Rarely, surgery may be needed to remove the infected part of the intestines.  Careful hand washing by you and your caregivers is important to prevent the spread of infection. In the hospital, your caregivers may also put on gowns and gloves to prevent the spread of the C. difficile bacteria. Your room is also cleaned regularly with a solution containing bleach or a product that is known to kill C. difficile. HOME CARE INSTRUCTIONS  Drink enough fluids to keep your urine clear or pale yellow. Avoid milk, caffeine, and alcohol.  Ask your caregiver for specific rehydration instructions.  Try eating small, frequent meals rather than large meals.  Take your antibiotics as directed. Finish them even if you start to feel better.  Do not use medicines to slow diarrhea. This could delay healing or cause complications.  Wash your hands thoroughly after using the bathroom and before preparing food.  Make sure people who live with you wash their hands often, too.  Carefully disinfect all surfaces with a product that contains chlorine bleach. SEEK MEDICAL CARE IF:  Diarrhea persists longer than expected or recurs after completing your course of antibiotic treatment for the C. difficile infection.  You have trouble staying hydrated. SEEK IMMEDIATE MEDICAL CARE IF:  You  develop a new fever.  You have increasing abdominal pain or tenderness.  There is blood in your stools, or your stools are dark black and tarry.  You cannot hold down food or liquids. MAKE SURE YOU:  Understand these instructions.  Will watch your condition.  Will get help right away if you are not doing well or get worse. Document Released: 03/07/2005 Document Revised: 10/12/2013 Document Reviewed: 11/03/2010 [facility] Patient Information 2015 [place], [place]. This information is not intended to replace advice given to you by your health care provider. Make sure you discuss any questions you have with your health care provider.    Consultations: Treatment Team:  Sherril Croon, MD Palliative [facility] Nephrology Heart Failure Team  Procedures Performed:  Ir Removal Tun Cv Cath W/o [facility]  03/15/2014   CLINICAL DATA:  Renal failure with previous placement of tunneled hemodialysis catheter. The patient has recovered renal function and requires removal of the tunneled dialysis catheter.  EXAM: REMOVAL OF TUNNELED CENTRAL VENOUS CATHETER  PROCEDURE: The right chest dialysis catheter site was prepped with chlorhexidine. A sterile gown and gloves were worn during the procedure. Local anesthesia was provided with 1% lidocaine.  Utilizing manual traction, the subcutaneous cuff of the dialysis catheter was freed. The catheter was then successfully removed in its entirety. A sterile dressing was applied over the  catheter exit site.  IMPRESSION: Removal of tunneled dialysis catheter utilizing sharp and blunt dissection. The procedure was uncomplicated.   Electronically Signed   By: Aletta Edouard M.D.   On: 03/15/2014 11:50   Dg Chest Port 1 View  03/13/2014   CLINICAL DATA:  Evaluate pulmonary edema after diuresis, followup congestive heart failure  EXAM: PORTABLE CHEST - 1 VIEW  COMPARISON:  03/11/2014  FINDINGS: Very limited inspiratory effect in the patient's chin obscures the upper lung zones. No  change right dual lumen central line. No change in cardiac enlargement.  Left lung appears clear. There remains mild hazy right perihilar opacity, although right-sided aeration is notably improved. Small right pleural effusions suspected.  IMPRESSION: Limited study with poor inspiratory effect. Significantly improved bilateral aeration with some mild residual right perihilar opacity suggesting mild lingering edema.   Electronically Signed   By: Skipper Cliche M.D.   On: 03/13/2014 21:19   Dg Chest Portable 1 View  03/11/2014   CLINICAL DATA:  Left-sided chest pain and difficulty breathing; hypertension  EXAM: PORTABLE CHEST - 1 VIEW  COMPARISON:  February 23, 2014  FINDINGS: There is generalized cardiomegaly with pulmonary venous hypertension. There is extensive edema diffusely. Central catheter tip is at the cavoatrial junction. No pneumothorax. No adenopathy apparent.  IMPRESSION: Congestive heart failure, progressed from prior study. No pneumothorax.   Electronically Signed   By: Lowella Grip M.D.   On: 03/11/2014 11:28    2D Echo: 03/13/2014 LVEF: 35-40% - Left ventricle: The cavity size was normal. Wall thickness was normal. Systolic function was moderately reduced. The estimated ejection fraction was in the range of 35% to 40%. Doppler parameters are consistent with restrictive physiology, indicative of decreased left ventricular diastolic compliance and/or increased left atrial pressure. - Regional wall motion abnormality: Hypokinesis of the mid-apical anterior, mid anteroseptal, mid inferoseptal, apical inferior, and apical myocardium. - Aortic valve: Trileaflet; mildly thickened leaflets. There was no stenosis. There was no significant regurgitation. Valve area (VTI): 3.31 cm^2. Valve area (Vmax): 2.93 cm^2. - Mitral valve: Mildly calcified annulus. Mildly thickened leaflets. A &quot;b&quot; notch is noted, suggesting elevated LV filling pressure. There was mild regurgitation. - Left  atrium: The atrium was moderately dilated. - Right ventricle: The cavity size was mildly dilated. - Right atrium: The atrium was moderately dilated. - Tricuspid valve: There was mild-moderate regurgitation. - Pulmonary arteries: PASP is at least 25 mmHg. Cannot estimate RApressure, IVC poorly visualized. - Systemic veins: The IVC is poorly visualized. - Pericardium, extracardiac: Small pericardial circumferential effusion, measuing 0.5 cm during diastole adjacent to LV. No evidence of tamponade physiology by echo. - Technically difficult study.  Admission HPI:  [name] is a 53 yo woman with a PMH of ESRD (HD initiated 1 week ago, today was her fourth session on a T/R/Sa schedule), CHF (LVEF 20%), right MCA CVA, left frontoparietal CVA, DMII, HTN and chronic lower back pain who presented to the ED after experiencing chest pain 2 hours and 40 minutes into her 3 hour dialysis session early this morning. Per RN at the dialysis center, she was conversational at the time, and described her chest pain as stabbing and burning and rated it at a 4/10 in intensity. At the time of the pain, her EKG was normal and her vitals were stable (130/54, HR 84, T 98.2, CBG 96). Because of her cardiac history, members of the dialysis team activated EMS. In the ED, she received lasix.   Per her sister, she has been  in and out of hospitals over the past several months for CHF exacerbations (most recently at [facility] 2 weeks ago for both fluid overload and UTI).   At the time of the interview, [name] was somnolent and endorsed an occipital headache. She reported that she had come in for a fever and headache at dialysis; she denied any history of chest pain. When asked what her primary problem was, she answered lower back pain. Per her sister, she tends to become lethargic when she hasn't been eating.   Admission Physical Exam:  Blood pressure 155/120, pulse 82, temperature 98.6 F (37 C), temperature source Oral,  resp. rate 20, height 5' (1.524 m), weight 245 lb (111.131 kg), SpO2 97.00%.  Appearance: morbidly obese woman lying toward the left side of her bed with arm sandwiched between the mattress and arm of bed  HEENT: AT/[place], PERRL, EOMi, drooling profusely  Heart: RRR, normal S1S2  Lungs: scattered rhonchi, diminished breath sounds throughout  Abdomen: obese abdomen, diminished abdominal sounds but BS present, nontender, pannus covered by white cream (difficult to visualize), but malodorus  GU: foley in place  Extremities: right BKA and left AKA, well-healed. Right arm atrophied in comparison to left arm  Neurologic: somnolent but A&Ox3, strength 4/5 RUE and 1/5 LUE, CN II-XI intact  Skin: unable to turn patient without nursing help  Hospital Course by problem list: Principal Problem:   Acute on chronic systolic heart failure Active Problems:   Enteritis due to Clostridium difficile   Type 2 diabetes mellitus with complication   Anxiety state   GERD   Morbid obesity   Anemia   Essential hypertension   Chronic kidney disease, stage 3   History of CVA (cerebrovascular accident)   PVD (peripheral vascular disease)   History of seizure   Advanced directives, counseling/discussion   Phantom limb syndrome   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 is a 53 yo woman with multiple co-morbidities who presented from dialysis to [facility] with sharp, burning chest pain on 03/11/14. She had no recurrence of this pain once in the hospital. She had negative troponins and an unchanged EKG, but her initial XR showed extensive edema diffusely. Furthermore, the patient had recently been started on dialysis after an outside hospitalization where she was also found to be in volume overload. She had received 4 sessions through a temporary catheter; her outpatient nephrologist, Dr. Andrey Cota, did not believe her to be a good candidate for hemodialysis. Finally, [name] complained of some diarrhea on  admission and was found to be infected with c difficile while here.  Acute on Chronic Systolic Heart Failure: Given her initial CXR and proBNP (52,920 on admission, up from 1850 two years ago), the patient was in volume overload, likely due to acute exacerbation of heart failure. Echo on this admission showed an EF 35-40%. She responded to aggressive diuresis with IV lasix, which was titrated up to 160 mg TID. When her urine output slowed on this regimen, metolazone 5 mg BID was added as per recommendations by the heart failure team. Her general clinical response was impressive, as she started to need less supplemental oxygen (3L at home --> 1L by the end of her hospitalization). Edema was otherwise difficult to assess, due to body habitus on lung exam and fact that patient has a BKA and AKA. However, early repeat CXR 10/3 already showed improvement in bilateral aeration with some mild residual right perihilar opacity (s/o mild lingering edema). Her weights  were not reliable. Her output was limited for much of the hospitalization, but then quickly rose (she put out nearly a liter) the first day her metolazone was added. Finally, her initial complaint of chest pain outside of the hospital did not recur; she described this pain as stabbing and burning - perhaps related to untreated GERD (she has a GERD hx). Home hydralazine, carvedilol, nitro and pepcid were continued and spironolactone was discontinued. Perhaps most importantly, the palliative care team met with the patient and her sister to define their goals of care, and one of these was determined to be symptom management of heart failure along with optimizing medications to prevent re-admission.  Clostridium Difficile Infection:  The patient complained of fever prior to admission (never recorded) and reported diarrhea. When she proceeded to have diarrhea once admitted, she was tested and found to be positive for c difficile. An oral regimen of vancomycin was  initiated, as she qualified as "high risk" due to her creatinine. Her diarrhea  decreased in frequency, with the last episode 24 hours prior to discharge. She had no fever or leukocytosis this admission. She was discharged on day 7 of 14 of her PO vancomycin. The palliative care team initiated a bowel regimen for the patient as an outpatient (once the diarrhea has definitively subsided).   CKD4: The nephrology team here and the patient's outpatient nephrologist agree that hemodialysis is not appropriate for this patient, given her comorbidities. Both question why it was started. Patient and family have agreed to stopping dialysis, and patient is relieved. Cr is trended up during this admission: 1.34-->2.13-->2.21-->2.33-->2.53-->2.81-->2.85 with aggressive diuresis. She needs continued aggressive diuresis and daily weights at [facility]. Her TDC was removed.  Hyponatremia:  The patient had no symptoms of hyponatremia, but fluctuated between low and low-normal levels during this hospitalization. Na 129-->132-->128-->130-->127. It may be due to the high dose Lasix or due to her general volume overload.  GERD: Has diagnosis of GERD, but does not take PPI. Initially put on PPI here, but switched to H2 blocker in context of c diff infection and risk of reinfection on long-term use PPI.   DMII: Stable. Most recent A1c in our records is 5.3% (but has been as high as 13.9%) with peripheral neuropathy and complications that led to her BKA and AKA. Patient continued to complain of 7/10 phantom limb pain. The patient's home lyrica was scaled back in an effort to decrease day-time sedation. Lidoderm patches were placed on her legs bilaterally and she was put on tylenol TID for pain control. One of her home medications, tramadol, was discontinued in the context of her seizure history and as a source of potential nausea and was replaced with hydromorphone. Given the patient's A1c and in order to avoid hypoglycemic  events, it is worth liberalizing her diabetes medications at home.   Weakness and Iron Deficiency Anemia: Patient's hemoglobin is at baseline (9.5 on this hospitalization). Chronically low iron (26 on this hospitalization, has been as low as 15 on past admissions several years ago). Patient received 510 mg feraheme as per renal recommendation. Iron has now been d/c.  Essential Hypertension: Stable. Her home carvedilol, hydralazine were continued; spironolactone was d/c. She was hypertensive on arrival, but then normotensive while here (413 to 244 systolic over 60 to 80 diastolic).  History of Multiple CVAs: Residual deficits include cognitive slowing and left-sided weakness. Patient is on coumadin and atorvastatin. The palliative team recommend therapy at SNF to prevent worsening of her contractions and  to encourage as much mobility as is possible.  Atrial Fibrillation: Documented in [facility]. Patient also has a history of DVT. She was subtherapeutic on admission, then became supratherapeutic on her last day of hospitalization. Recommend holding coumadin 10/9 and 10/10, then restart at 6 mg/day on 10/11.   Morbid Obesity: bed bound at SNF.  History of Seizure: Home keppra was continued.  Sleep Apnea: Home Bipap was continued.  Depression and Anxiety: Used PRN ativan twice a day. Home sertraline was switched to cymbalta.  Code Status: Chaplain has visited patient; patient has chosen sister as Economist.  Palliative Ppx: Daily bowel regimen (senokot-s), physical therapy rehabilitation and recommendation for [facility] to follow at SNF   Diet: speech therapy assessed the patient and recommended mechanical 3 diet with medications crushed in puree with patient seated upright at 90 degrees.  Discharge Vitals:   BP 115/89  Pulse 74  Temp(Src) 97.5 F (36.4 C) (Oral)  Resp 16  Ht 5' (1.524 m)  Wt 248 lb 4.8 oz (112.628 kg)  BMI 48.49 kg/m2  SpO2 100%  Discharge  Labs:  INR:  BMP:  Signed: Drucilla Schmidt, MD 03/19/2014, 7:20 AM    Services Ordered on Discharge: recommendation for [place] to follow at SNF. Recommend physical therapy at SNF. Equipment Ordered on Discharge: none

## 2014-03-19 NOTE — Progress Notes (Signed)
Medicare Important Message given? YES  (If response is "NO", the following Medicare IM given date fields will be blank)  Date Medicare IM given: 03/19/14 Medicare IM given by: Frann Rider

## 2014-03-19 NOTE — Discharge Instructions (Signed)
Heart Failure °Heart failure is a condition in which the heart has trouble pumping blood. This means your heart does not pump blood efficiently for your body to work well. In some cases of heart failure, fluid may back up into your lungs or you may have swelling (edema) in your lower legs. Heart failure is usually a long-term (chronic) condition. It is important for you to take good care of yourself and follow your health care provider's treatment plan. °CAUSES  °Some health conditions can cause heart failure. Those health conditions include: °· High blood pressure (hypertension). Hypertension causes the heart muscle to work harder than normal. When pressure in the blood vessels is high, the heart needs to pump (contract) with more force in order to circulate blood throughout the body. High blood pressure eventually causes the heart to become stiff and weak. °· Coronary artery disease (CAD). CAD is the buildup of cholesterol and fat (plaque) in the arteries of the heart. The blockage in the arteries deprives the heart muscle of oxygen and blood. This can cause chest pain and may lead to a heart attack. High blood pressure can also contribute to CAD. °· Heart attack (myocardial infarction). A heart attack occurs when one or more arteries in the heart become blocked. The loss of oxygen damages the muscle tissue of the heart. When this happens, part of the heart muscle dies. The injured tissue does not contract as well and weakens the heart's ability to pump blood. °· Abnormal heart valves. When the heart valves do not open and close properly, it can cause heart failure. This makes the heart muscle pump harder to keep the blood flowing. °· Heart muscle disease (cardiomyopathy or myocarditis). Heart muscle disease is damage to the heart muscle from a variety of causes. These can include drug or alcohol abuse, infections, or unknown reasons. These can increase the risk of heart failure. °· Lung disease. Lung disease  makes the heart work harder because the lungs do not work properly. This can cause a strain on the heart, leading it to fail. °· Diabetes. Diabetes increases the risk of heart failure. High blood sugar contributes to high fat (lipid) levels in the blood. Diabetes can also cause slow damage to tiny blood vessels that carry important nutrients to the heart muscle. When the heart does not get enough oxygen and food, it can cause the heart to become weak and stiff. This leads to a heart that does not contract efficiently. °· Other conditions can contribute to heart failure. These include abnormal heart rhythms, thyroid problems, and low blood counts (anemia). °Certain unhealthy behaviors can increase the risk of heart failure, including: °· Being overweight. °· Smoking or chewing tobacco. °· Eating foods high in fat and cholesterol. °· Abusing illicit drugs or alcohol. °· Lacking physical activity. °SYMPTOMS  °Heart failure symptoms may vary and can be hard to detect. Symptoms may include: °· Shortness of breath with activity, such as climbing stairs. °· Persistent cough. °· Swelling of the feet, ankles, legs, or abdomen. °· Unexplained weight gain. °· Difficulty breathing when lying flat (orthopnea). °· Waking from sleep because of the need to sit up and get more air. °· Rapid heartbeat. °· Fatigue and loss of energy. °· Feeling light-headed, dizzy, or close to fainting. °· Loss of appetite. °· Nausea. °· Increased urination during the night (nocturia). °DIAGNOSIS  °A diagnosis of heart failure is based on your history, symptoms, physical examination, and diagnostic tests. Diagnostic tests for heart failure may include: °·   Echocardiography. °· Electrocardiography. °· Chest X-ray. °· Blood tests. °· Exercise stress test. °· Cardiac angiography. °· Radionuclide scans. °TREATMENT  °Treatment is aimed at managing the symptoms of heart failure. Medicines, behavioral changes, or surgical intervention may be necessary to  treat heart failure. °· Medicines to help treat heart failure may include: °¨ Angiotensin-converting enzyme (ACE) inhibitors. This type of medicine blocks the effects of a blood protein called angiotensin-converting enzyme. ACE inhibitors relax (dilate) the blood vessels and help lower blood pressure. °¨ Angiotensin receptor blockers (ARBs). This type of medicine blocks the actions of a blood protein called angiotensin. Angiotensin receptor blockers dilate the blood vessels and help lower blood pressure. °¨ Water pills (diuretics). Diuretics cause the kidneys to remove salt and water from the blood. The extra fluid is removed through urination. This loss of extra fluid lowers the volume of blood the heart pumps. °¨ Beta blockers. These prevent the heart from beating too fast and improve heart muscle strength. °¨ Digitalis. This increases the force of the heartbeat. °· Healthy behavior changes include: °¨ Obtaining and maintaining a healthy weight. °¨ Stopping smoking or chewing tobacco. °¨ Eating heart-healthy foods. °¨ Limiting or avoiding alcohol. °¨ Stopping illicit drug use. °¨ Physical activity as directed by your health care provider. °· Surgical treatment for heart failure may include: °¨ A procedure to open blocked arteries, repair damaged heart valves, or remove damaged heart muscle tissue. °¨ A pacemaker to improve heart muscle function and control certain abnormal heart rhythms. °¨ An internal cardioverter defibrillator to treat certain serious abnormal heart rhythms. °¨ A left ventricular assist device (LVAD) to assist the pumping ability of the heart. °HOME CARE INSTRUCTIONS  °· Take medicines only as directed by your health care provider. Medicines are important in reducing the workload of your heart, slowing the progression of heart failure, and improving your symptoms. °¨ Do not stop taking your medicine unless directed by your health care provider. °¨ Do not skip any dose of medicine. °¨ Refill your  prescriptions before you run out of medicine. Your medicines are needed every day. °· Engage in moderate physical activity if directed by your health care provider. Moderate physical activity can benefit some people. The elderly and people with severe heart failure should consult with a health care provider for physical activity recommendations. °· Eat heart-healthy foods. Food choices should be free of trans fat and low in saturated fat, cholesterol, and salt (sodium). Healthy choices include fresh or frozen fruits and vegetables, fish, lean meats, legumes, fat-free or low-fat dairy products, and whole grain or high fiber foods. Talk to a dietitian to learn more about heart-healthy foods. °· Limit sodium if directed by your health care provider. Sodium restriction may reduce symptoms of heart failure in some people. Talk to a dietitian to learn more about heart-healthy seasonings. °· Use healthy cooking methods. Healthy cooking methods include roasting, grilling, broiling, baking, poaching, steaming, or stir-frying. Talk to a dietitian to learn more about healthy cooking methods. °· Limit fluids if directed by your health care provider. Fluid restriction may reduce symptoms of heart failure in some people. °· Weigh yourself every day. Daily weights are important in the early recognition of excess fluid. You should weigh yourself every morning after you urinate and before you eat breakfast. Wear the same amount of clothing each time you weigh yourself. Record your daily weight. Provide your health care provider with your weight record. °· Monitor and record your blood pressure if directed by your health care   provider.  Check your pulse if directed by your health care provider.  Lose weight if directed by your health care provider. Weight loss may reduce symptoms of heart failure in some people.  Stop smoking or chewing tobacco. Nicotine makes your heart work harder by causing your blood vessels to constrict.  Do not use nicotine gum or patches before talking to your health care provider.  Keep all follow-up visits as directed by your health care provider. This is important.  Limit alcohol intake to no more than 1 drink per day for nonpregnant women and 2 drinks per day for men. One drink equals 12 ounces of beer, 5 ounces of wine, or 1 ounces of hard liquor. Drinking more than that is harmful to your heart. Tell your health care provider if you drink alcohol several times a week. Talk with your health care provider about whether alcohol is safe for you. If your heart has already been damaged by alcohol or you have severe heart failure, drinking alcohol should be stopped completely.  Stop illicit drug use.  Stay up-to-date with immunizations. It is especially important to prevent respiratory infections through current pneumococcal and influenza immunizations.  Manage other health conditions such as hypertension, diabetes, thyroid disease, or abnormal heart rhythms as directed by your health care provider.  Learn to manage stress.  Plan rest periods when fatigued.  Learn strategies to manage high temperatures. If the weather is extremely hot:  Avoid vigorous physical activity.  Use air conditioning or fans or seek a cooler location.  Avoid caffeine and alcohol.  Wear loose-fitting, lightweight, and light-colored clothing.  Learn strategies to manage cold temperatures. If the weather is extremely cold:  Avoid vigorous physical activity.  Layer clothes.  Wear mittens or gloves, a hat, and a scarf when going outside.  Avoid alcohol.  Obtain ongoing education and support as needed.  Participate in or seek rehabilitation as needed to maintain or improve independence and quality of life. SEEK MEDICAL CARE IF:   Your weight increases by 03 lb/1.4 kg in 1 day or 05 lb/2.3 kg in a week.  You have increasing shortness of breath that is unusual for you.  You are unable to participate in  your usual physical activities.  You tire easily.  You cough more than normal, especially with physical activity.  You have any or more swelling in areas such as your hands, feet, ankles, or abdomen.  You are unable to sleep because it is hard to breathe.  You feel like your heart is beating fast (palpitations).  You become dizzy or light-headed upon standing up. SEEK IMMEDIATE MEDICAL CARE IF:   You have difficulty breathing.  There is a change in mental status such as decreased alertness or difficulty with concentration.  You have a pain or discomfort in your chest.  You have an episode of fainting (syncope). MAKE SURE YOU:   Understand these instructions.  Will watch your condition.  Will get help right away if you are not doing well or get worse. Document Released: 05/28/2005 Document Revised: 10/12/2013 Document Reviewed: 06/27/2012 [facility] Patient Information 2015 [place], [place]. This information is not intended to replace advice given to you by your health care provider. Make sure you discuss any questions you have with your health care provider.  Clostridium Difficile Infection Clostridium difficile (C. difficile) is a bacteria found in the intestinal tract or colon. Under certain conditions, it causes diarrhea and sometimes severe disease. The severe form of the disease is known as  pseudomembranous colitis (often called C. difficile colitis). This disease can damage the lining of the colon or cause the colon to become enlarged (toxic megacolon). CAUSES Your colon normally contains many different bacteria, including C. difficile. The balance of bacteria in your colon can change during illness. This is especially true when you take antibiotic medicine. Taking antibiotics may allow the C. difficile to grow, multiply excessively, and make a toxin that then causes illness. The elderly and people with certain medical conditions have a greater risk of getting C. difficile  infections. SYMPTOMS  Watery diarrhea.  Fever.  Fatigue.  Loss of appetite.  Nausea.  Abdominal swelling, pain, or tenderness.  Dehydration. DIAGNOSIS Your symptoms may make your caregiver suspect a C. difficile infection, especially if you have used antibiotics in the preceding weeks. However, there are only 2 ways to know for certain whether you have a C. difficile infection:  A lab test that finds the toxin in your stool.  The specific appearance of an abnormality (pseudomembrane) in your colon. This can only be seen by doing a sigmoidoscopy or colonoscopy. These procedures involve passing an instrument through your rectum to look at the inside of your colon. Your caregiver will help determine if these tests are necessary. TREATMENT  Most people are successfully treated with one of two specific antibiotics, usually given by mouth. Other antibiotics you are receiving are stopped if possible.  Intravenous (IV) fluids and correction of electrolyte imbalance may be necessary.  Rarely, surgery may be needed to remove the infected part of the intestines.  Careful hand washing by you and your caregivers is important to prevent the spread of infection. In the hospital, your caregivers may also put on gowns and gloves to prevent the spread of the C. difficile bacteria. Your room is also cleaned regularly with a solution containing bleach or a product that is known to kill C. difficile. HOME CARE INSTRUCTIONS  Drink enough fluids to keep your urine clear or pale yellow. Avoid milk, caffeine, and alcohol.  Ask your caregiver for specific rehydration instructions.  Try eating small, frequent meals rather than large meals.  Take your antibiotics as directed. Finish them even if you start to feel better.  Do not use medicines to slow diarrhea. This could delay healing or cause complications.  Wash your hands thoroughly after using the bathroom and before preparing food.  Make sure  people who live with you wash their hands often, too.  Carefully disinfect all surfaces with a product that contains chlorine bleach. SEEK MEDICAL CARE IF:  Diarrhea persists longer than expected or recurs after completing your course of antibiotic treatment for the C. difficile infection.  You have trouble staying hydrated. SEEK IMMEDIATE MEDICAL CARE IF:  You develop a new fever.  You have increasing abdominal pain or tenderness.  There is blood in your stools, or your stools are dark black and tarry.  You cannot hold down food or liquids. MAKE SURE YOU:  Understand these instructions.  Will watch your condition.  Will get help right away if you are not doing well or get worse. Document Released: 03/07/2005 Document Revised: 10/12/2013 Document Reviewed: 11/03/2010 [facility] Patient Information 2015 [place], [place]. This information is not intended to replace advice given to you by your health care provider. Make sure you discuss any questions you have with your health care provider.

## 2014-03-19 NOTE — Progress Notes (Signed)
 Subjective: [name] feels "very good" this morning. She is very eager to get home today. She is breathing well, feels that she does not need as much oxygen as she has been getting, and is hungry for breakfast.  Objective: Vital signs in last 24 hours: Filed Vitals:   03/18/14 2000 03/18/14 2152 03/19/14 0051 03/19/14 0630  BP:  115/63  115/89  Pulse:  78 80 74  Temp:  97.5 F (36.4 C)  97.5 F (36.4 C)  TempSrc:  Oral  Oral  Resp: 18 18 16 16  Height:      Weight:    248 lb 4.8 oz (112.628 kg)  SpO2: 100% 100% 100% 100%   Weight change: -18 lb 11.2 oz (-8.482 kg)  Intake/Output Summary (Last 24 hours) at 03/19/14 0710 Last data filed at 03/19/14 0642  Gross per 24 hour  Intake    333 ml  Output    950 ml  Net   -617 ml   Physical Exam: Appearance: morbidly obese woman lying in bed watching the news. [place] at 1 L. Appears more comfortable and more alert than at any other time during hospitalization HEENT: AT/[place], TDC removed, former site is dressed and C/D/I Heart: RRR, no m/r/g Lungs: anterior exam: diminished breath sounds throughout but difficult exam due to body habitus Abdomen: obese abdomen, + BS present, nontender, no rebound/guarding/ridigity, abdomen is soft GU: foley in place with ~ 150cc of clear yellow urine Extremities: right BKA and left AKA, well-healed. Right arm atrophied in comparison to left arm, right arm has resting tremor Neurologic: very alert this morning, oriented x3, appropriate verbal responses, conversational  Tele: d/c  Lab Results: Basic Metabolic Panel:  Recent Labs Lab 03/16/14 0504 03/17/14 0437 03/18/14 0311  NA 132* 128* 130*  K 4.0 3.9 3.7  CL 93* 90* 91*  CO2 23 23 22  GLUCOSE 114* 110* 87  BUN 29* 31* 35*  CREATININE 2.33* 2.53* 2.81*  CALCIUM 9.1 9.0 9.0  PHOS 2.7  --  2.8   CBC:  Recent Labs Lab 03/14/14 0308 03/17/14 0437  WBC 4.5 5.8  HGB 9.3* 9.5*  HCT 29.7* 29.8*  MCV 92.2 88.2  PLT 187 280     Coagulation:  Recent Labs Lab 03/16/14 0504 03/17/14 0437 03/18/14 0311 03/19/14 0517  LABPROT 25.1* 30.2* 30.8* 33.3*  INR 2.28* 2.88* 2.96* 3.27*   Medications: I have reviewed the patient's current medications. Scheduled Meds: . acetaminophen  650 mg Oral TID  . allopurinol  100 mg Oral Daily  . antiseptic oral rinse  7 mL Mouth Rinse q12n4p  . atorvastatin  20 mg Oral QHS  . carvedilol  3.125 mg Oral BID WC  . chlorhexidine  15 mL Mouth Rinse BID  . DULoxetine  20 mg Oral Daily  . famotidine  20 mg Oral Daily  . furosemide  160 mg Intravenous TID  . hydrALAZINE  10 mg Oral TID  . insulin aspart  0-5 Units Subcutaneous QHS  . insulin aspart  0-9 Units Subcutaneous TID WC  . isosorbide dinitrate  20 mg Oral TID  . levETIRAcetam  500 mg Oral BID  . lidocaine  2 patch Transdermal Q24H  . metolazone  5 mg Oral BID  . miconazole   Topical BID  . pregabalin  25 mg Oral QHS  . sodium chloride  3 mL Intravenous Q12H  . vancomycin  125 mg Oral QID  . Warfarin - Pharmacist Dosing Inpatient   Does not apply   q1800   Continuous Infusions:  PRN Meds:.diazepam, gi cocktail, HYDROmorphone, ondansetron (ZOFRAN) IV  Assessment/Plan:  [name] is a 53 yo woman with multiple co-morbidities who presented from dialysis with sharp, burning chest pain on 03/11/14. She had no recurrence of this pain, a negative i-stat troponin and unchanged EKG, but her initial XR showed extensive edema diffusely. At this point, she has responded clinically to diuresis, but her urine output has been limited. Furthermore, her recently initiated dialysis has been stopped by nephrology, who question whether she was a good candidate from the start. She has also been found to be infected with c difficile and is being treated with a course of oral vancomycin for high risk infection. She and her sister met with Palliative Care yesterday to discuss goals of care; these are now defined as avoiding re-hospitalization,  aggressive treatment of reversible conditions (including management of HF and treatment of pain/weakness/depression), avoiding HD and rehabilitation. She appears more alert and comfortable this morning than at any other time during this admission.  Acute on Chronic Systolic Heart Failure: Given her initial CXR and proBNP (52,920 on admission, up from 1850 two years ago), she was likely in volume overload due to acute exacerbation of heart failure. Echo on this admission with EF 35-40%. IV Lasix currently running through IV and metolazone initiated yesterday. Net output yesterday 617 mL. Weight has been recorded as ~3 pound increase, but doubt accuracy of weights. Difficult to get a good exam for volume status (due to body habitus on lung exam and BKA/AKA for assessment of LE edema); however, patient is breathing more easily. Chest pain (at outside HD center) has not recurred and was described as stabbing and burning - perhaps related to untreated GERD (she has a GERD hx). Goals now are to optimize medications for re-admission prevention. - Appreciate heart failure consult - Appreciate palliative care consult - Continuing increased dose of lasix 160 mg IV TID daily for aggressive diuresis; added metolazone yesterday (5mg BID); consider adding Demadex today as per HF - Continue hydralazine, carvedilol and atorvastatin; spironolactone has been d/c - Strict I's & O's with daily weights - Continue nitro TID - Continue pepcid - Continue tele monitoring - Contact HF team for symptom management  Clostridium Difficile Infection:  The patient complained of fever prior to admission (never recorded) and reported diarrhea. She feels the amount of diarrhea has decreased. She has not had fever or leukocytosis this admission. She is on day 7 of 14 of her PO vancomycin. - Continue PO vancomycin 125 mg four times per day. Guidelines are to continue this treatment for 10 to 14 days. This may be escalated to 500 mg at the  same frequency if she does not show signs of improvement - Enteric precautions - CBC/fever monitoring; has remained afebrile - Zofran PRN - Cleared by SNF to return while still being treated - Monitor closely; Palliative initiating daily bowel regimen  CKD4: Nephrology feels the patient does not need continued HD given relatively good GFR (though this has fallen to 25). Appreciate renal input. Of note, two nephrologists have now suggested that [name] is a poor HD candidate while in this hospital; both question why the HD was initiated. Patient and family agree to stopping it. Cr is trending up: 1.34-->2.13-->2.21-->2.33-->2.53-->2.81 (pending today) this admission with aggressive diuresis. She likely needs continued diuresis and we will need strict I&Os to monitor progress. TDC is now removed. - Appreciate renal consult and Dr. Mattingly's correspondence with patient's outpatient nephrologist (  Dr. Igwenezie) - Continue with lasix to IV 160 mg TID and metolazone - Monitoring I/O, weights and renal function  Hyponatremia:  Na 129-->132-->128-->130 (pending today). She is AAO x3 and has not been confused or nauseous. Low Na is possibly due to Lasix or volume overload. - continue to diurese and monitor renal function  GERD: Has diagnosis of GERD, but does not take PPI. Initially put on PPI here, but switched to H2 blocker in context of c diff infection and risk of reinfection on long-term use PPI.  - Discontinued protonix - Continue with pepcid; consider sending her home on this medication   DMII: Stable. Most recent A1c in our records is 5.3% (but has been as high as 13.9%) with peripheral neuropathy and complications that led to her BKA and AKA. On 5 novolog with meals at home. Patient continues to complain of phantom limb pain. - Decrease home Lyrica to 25 mg QHS (in effort to decreased day-time sedation) - Initiate lidoderm patch to bilateral stumps (for neuropathy and phantom limb pain) -  Tylenol TID for pain - D/c tramadol in context of seizure history and potential for nausea replace with hydromorphone in context of CKD - Continue ISS (sensitive renal), sliding scale at bedtime; consider liberalizing DM meds in context of excellent A1c and desire to avoid hypoglycemic events  Weakness and Iron Deficiency Anemia: Patient's hemoglobin is at baseline (9.5). Chronically low iron. Patient received 510 mg feraheme as per renal recommendation. Iron has now been d/c. - Encourage PT and mobility  Essential Hypertension: Stable - Continue home carvedilol, hydralazine; spironolactone d/c  History of Multiple CVAs: Residual deficits include cognitive slowing and left-sided weakness. Patient is on coumadin, atorvastatin.   Atrial Fibrillation: Documented in [facility] records. Patient also has a history of DVT. - Continue coumadin per pharmacy; supra-therapeutic at 3.27 today  Morbid Obesity: bed bound at SNF  - Appreciate PT visit, SLP and OT attempted visit; recommendation is to return to SNF  History of Seizure:  - Continue home keppra  Sleep Apnea:  - Continue home bipap at night   Depression and Anxiety: Has been using PRN ativan twice a day - D/c home sertraline yesterday, replaced with cymbalta - Continue home ativan PRN   Code Status: Chaplain has visited patient; patient has chosen sister as HCPOA.  Palliative Ppx:  - Send patient out on daily bowel regimen (senokot-s) - PT rehabilitation  - Recommendation for HOTP Palliative Care Services to follow at SNF  DVT Ppx:  - Patient is on coumadin   Diet: Speech assessment: dysphagia 3 (mechanical 3) diet; medications crushed with puree. All seated upright 90 degrees.  Dispo: Disposition is deferred at this time, awaiting improvement of current medical problems.  Anticipated discharge in approximately 0 day(s).   The patient does have a current PCP (Randy Long, MD) and does not need an OPC hospital follow-up  appointment after discharge.  The patient does have transportation limitations that hinder transportation to clinic appointments.  .Services Needed at time of discharge: Y = Yes, Blank = No PT:   OT:   RN:   Equipment:   Other:     LOS: 8 days    , MD 03/19/2014, 7:10 AM 

## 2014-03-19 NOTE — Progress Notes (Signed)
CSW (Clinical Social Worker) prepared pt dc packet and placed with shadow chart. CSW arranged non-emergent ambulance transport. Pt, pt family, pt nurse, and facility informed. CSW signing off.  Emilliano Dilworth, LCSWA [phone]  

## 2014-03-19 NOTE — Progress Notes (Signed)
Patient ID: [name], female   DOB: 18-Nov-1960, 53 y.o.   MRN: [id number]        Attending progress note    Date of Admission:  03/11/2014     Principal Problem:   Acute on chronic systolic heart failure Active Problems:   Type 2 diabetes mellitus with complication   Anxiety state   GERD   Morbid obesity   Anemia   Essential hypertension   Chronic kidney disease, stage 3   History of CVA (cerebrovascular accident)   PVD (peripheral vascular disease)   History of seizure   Enteritis due to Clostridium difficile   Advanced directives, counseling/discussion   Phantom limb syndrome   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   . acetaminophen  650 mg Oral TID  . allopurinol  100 mg Oral Daily  . antiseptic oral rinse  7 mL Mouth Rinse q12n4p  . atorvastatin  20 mg Oral QHS  . carvedilol  3.125 mg Oral BID WC  . chlorhexidine  15 mL Mouth Rinse BID  . DULoxetine  20 mg Oral Daily  . famotidine  20 mg Oral Daily  . furosemide  160 mg Intravenous TID  . hydrALAZINE  10 mg Oral TID  . insulin aspart  0-5 Units Subcutaneous QHS  . insulin aspart  0-9 Units Subcutaneous TID WC  . isosorbide dinitrate  20 mg Oral TID  . levETIRAcetam  500 mg Oral BID  . lidocaine  2 patch Transdermal Q24H  . metolazone  5 mg Oral BID  . miconazole   Topical BID  . pregabalin  25 mg Oral QHS  . sodium chloride  3 mL Intravenous Q12H  . vancomycin  125 mg Oral QID  . Warfarin - Pharmacist Dosing Inpatient   Does not apply q1800    I have seen and examined [name] with my team today. Her decompensated heart failure is doing much better now. Her creatinine continues to inch upward but she has no current indications for going back on hemodialysis. She is still struggling with having conversations about goals of care. I think it is an excellent suggestion to have the palliative care team visit with her and her sister when she returns to the skilled nursing facility.  Michel Bickers, MD [facility]  [facility] [phone] pager   [phone] cell 03/19/2014, 12:27 PM

## 2014-03-19 NOTE — Progress Notes (Signed)
Subjective: Patient was alert and says that she is feeling much better and is ready to leave the hospital. She states that she has not had anymore diarrhea since last night. At this time she now wants to return to her current SNF [facility].  When the palliative care talk was brought up again the patient's affect noticeably changed and became flat.   Objective: Vital signs in last 24 hours: Filed Vitals:   03/18/14 2000 03/18/14 2152 03/19/14 0051 03/19/14 0630  BP:  115/63  115/89  Pulse:  78 80 74  Temp:  97.5 F (36.4 C)  97.5 F (36.4 C)  TempSrc:  Oral  Oral  Resp: 18 18 16 16   Height:      Weight:    112.628 kg (248 lb 4.8 oz)  SpO2: 100% 100% 100% 100%   Weight change: -8.482 kg (-18 lb 11.2 oz)  Intake/Output Summary (Last 24 hours) at 03/19/14 1214 Last data filed at 03/19/14 1056  Gross per 24 hour  Intake    454 ml  Output    950 ml  Net   -496 ml   Physical Exam  Gen: NAD, morbidly obese, sitting up comfortably in bed  HEENT: AT/[place], transdermal catheter is now removed Heart: RRR, no murmur, rubs or gallops  Lungs: diminished breath sounds anteriorly, examination limited by patients body habitus (patient is unable to hold herself up right)  Abdomen: obese abdomen, soft, slightly tender BS+   GU: pt has foley in place, urine out put has drastically improved ~100 cc of urine in bag Extremities: right BKA, left AKA, Right arm smaller in size than right , Right arm has resting tremor  Neuro: somnolent but A&O x 3   Lab Results: Results for orders placed during the hospital encounter of 03/11/14 (from the past 24 hour(s))  GLUCOSE, CAPILLARY     Status: Abnormal   Collection Time    03/18/14  4:40 PM      Result Value Ref Range   Glucose-Capillary 137 (*) 70 - 99 mg/dL  GLUCOSE, CAPILLARY     Status: None   Collection Time    03/18/14  9:53 PM      Result Value Ref Range   Glucose-Capillary 98  70 - 99 mg/dL   Comment 1 Notify RN    PROTIME-INR     Status:  Abnormal   Collection Time    03/19/14  5:17 AM      Result Value Ref Range   Prothrombin Time 33.3 (*) 11.6 - 15.2 seconds   INR 3.27 (*) 0.00 - 99991111  BASIC METABOLIC PANEL     Status: Abnormal   Collection Time    03/19/14  5:17 AM      Result Value Ref Range   Sodium 127 (*) 137 - 147 mEq/L   Potassium 3.9  3.7 - 5.3 mEq/L   Chloride 91 (*) 96 - 112 mEq/L   CO2 23  19 - 32 mEq/L   Glucose, Bld 99  70 - 99 mg/dL   BUN 36 (*) 6 - 23 mg/dL   Creatinine, Ser 2.85 (*) 0.50 - 1.10 mg/dL   Calcium 8.7  8.4 - 10.5 mg/dL   GFR calc non Af Amer 18 (*) >90 mL/min   GFR calc Af Amer 21 (*) >90 mL/min   Anion gap 13  5 - 15  GLUCOSE, CAPILLARY     Status: Abnormal   Collection Time    03/19/14  7:41 AM  Result Value Ref Range   Glucose-Capillary 113 (*) 70 - 99 mg/dL  GLUCOSE, CAPILLARY     Status: Abnormal   Collection Time    03/19/14 11:59 AM      Result Value Ref Range   Glucose-Capillary 113 (*) 70 - 99 mg/dL   Micro Results: Recent Results (from the past 240 hour(s))  URINE CULTURE     Status: None   Collection Time    03/11/14 12:00 PM      Result Value Ref Range Status   Specimen Description URINE, CATHETERIZED   Final   Special Requests NONE   Final   Culture  Setup Time     Final   Value: 03/11/2014 12:42     Performed at [facility]     Final   Value: >=100,000 COLONIES/ML     Performed at [facility]   Culture     Final   Value: Multiple bacterial morphotypes present, none predominant. Suggest appropriate recollection if clinically indicated.     Performed at [facility]   Report Status 03/12/2014 FINAL   Final  CLOSTRIDIUM DIFFICILE BY PCR     Status: Abnormal   Collection Time    03/12/14 11:35 AM      Result Value Ref Range Status   C difficile by pcr POSITIVE (*) NEGATIVE Final   Comment: CRITICAL RESULT CALLED TO, READ BACK BY AND VERIFIED WITH:     Sherry Evens RN 12:50 03/12/14 ([email or web address])    Studies/Results: No results found. Medications: I have reviewed the patient's current medications. Scheduled Meds: . acetaminophen  650 mg Oral TID  . allopurinol  100 mg Oral Daily  . antiseptic oral rinse  7 mL Mouth Rinse q12n4p  . atorvastatin  20 mg Oral QHS  . carvedilol  3.125 mg Oral BID WC  . chlorhexidine  15 mL Mouth Rinse BID  . DULoxetine  20 mg Oral Daily  . famotidine  20 mg Oral Daily  . furosemide  160 mg Intravenous TID  . hydrALAZINE  10 mg Oral TID  . insulin aspart  0-5 Units Subcutaneous QHS  . insulin aspart  0-9 Units Subcutaneous TID WC  . isosorbide dinitrate  20 mg Oral TID  . levETIRAcetam  500 mg Oral BID  . lidocaine  2 patch Transdermal Q24H  . metolazone  5 mg Oral BID  . miconazole   Topical BID  . pregabalin  25 mg Oral QHS  . sodium chloride  3 mL Intravenous Q12H  . vancomycin  125 mg Oral QID  . Warfarin - Pharmacist Dosing Inpatient   Does not apply q1800   Continuous Infusions:  PRN Meds:.diazepam, gi cocktail, HYDROmorphone, ondansetron (ZOFRAN) IV Assessment/Plan: Principal Problem:   Acute on chronic systolic heart failure Active Problems:   Type 2 diabetes mellitus with complication   Anxiety state   GERD   Morbid obesity   Anemia   Essential hypertension   Chronic kidney disease, stage 3   History of CVA (cerebrovascular accident)   PVD (peripheral vascular disease)   History of seizure   Enteritis due to Clostridium difficile   Advanced directives, counseling/discussion   Phantom limb syndrome   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  [name] is a 53 year old woman with multiple co-morbidities who presented after having sharp, burning chest pain during a dialysis treatment on 10/1.  Since the episode in the dialysis center there patient has not had a recurrence of  this pain.  Also there have not been any changes in her EKG and her troponin has remained negative.  Her initial CXR on admission showed diffuse edema.  With aggressive  diuresis this has been shown to be resolving on a subsequent CXR performed on 10/3.  Her urine output has not been significant, she is being closely monitored by nephrology as well.  Per nephrology we changed her Lasix route from IV lasix 160 mg BID to IV Lasix 160 mg TID and have now added metolazone 5 mg BID to maximize her diuresis.  On her second day of admission she was found to be C.Diff PCR positive so she is currently completing a course of oral vancomycin,she is on day 8 of her 2 week course.    Acute on Chronic systolic heart failure: Patients had CXR on 10/3 which showed improvement from day of admission 10/01. She was likely volume overloaded secondary to an acute exacerbation of her heart failure.  Echo obtained on this admission also showed that her EF is 35-40% instead of 20% as was suggested from records obtained [facility].  IV Lasix was started to address her fluid overload, and strict  Is& Os had been requested multiple times from nursing staff but had not been recorded accurately which makes it difficult to determine how much she has actually been diuresing.  An accurate assessment is also difficult to determine based on exam due to the patients body habitus (she is morbidly obese with a large pannus).  Can consider the patients chest pain at HD center to be resolved as it as not recurred ; it was most likely due to untreated GERD (pt has history of GERD but was not on medication) or volume overload as she is now breathing easier.   - Change patient to PO Lasix 160 mg TID  From IV and continue metolazone 5 mg BID per nephrology recommendations  - Continue home hydralazine and carvedilol; stop spironolactone per nephrology recommendations  - Strict Is & Os  - Daily weights  - Continue PO Pepcid 20 mg   - Continue nitro  - Switching out IV Lasix 160 mg TID for PO lasix 160 mg TID  Clostridium difficile infection/Epigastric pain: Patient noted loosening of her stools on 10/2  and was C.Diff PCR positive on 10/02. She is high risk for C.Diff given her decreased kidney function.  Risk for recurrence based on literature is also increased due to the patient's race (African American), multiple co-morbidies and age.  We also started the patient on a PPI 1 day prior to the C.diff diagnosis.  Prolonged PPI use is associated with an increase risk for developing a C.diff infection.  Studies have also shown that PPI and H2 blocker administration started during same hospital course as C.diff diagnosis or started after clearance of infection did not increase the probably of a C.diff recurrence with in the first 15-90 days post admission.  But we switched her to an H2 blocker due to her other risk factors that place her at an increased risk for relapsing C. Diff infection.   - Continue vancomycin po 125 mg four times per day for 10-14 days  - remain on enteric precautions  - CBC/ fever monitoring  - consulted SW, no complications exist for going back to SNF on oral vanc   CKD3: Patient was seen by nephrology who determined that she was not ESRD as previously thought. Also suggested stopping HD, cathter was removed.  They also  recommended stopping her sprinoloactone and going up on her IV Lasix dose. Patients creatinine has been trending up 1.34 to 2.13 to 2.21 to 2.33 to 2.53 while been aggressively dieresised, patient's baseline creatinine is ~2.  - Switch to PO Lasix 160 mg TID from IV  - continue monitoring renal function and strict I's & O's   GERD: Pt has hx of GERD but was not taking home med for this, She reports GERD- like symptoms here in hospital  - Continue pepcid today   Hyponatremia: Patient was found to be hyponatremic based on lab testing . Today patient is still hyponatremic to 127.  No change in the patients mental status, still A&O x 3 also has not been confused.  Her hyponatremia is likely due to diuresis or possibly volume overload  - continue to diurese on PO lasix  and metolazone    Iron def - Per nephrology started patient on 510 mg feraheme  DMII: No change A1c 5.3% patient has BKA and AKA. DMII is controled with insulin  - Continue Lyrica but a decreased dose of 25 mg for neuropathy and phantom limb pain  - continue ISS (sensitive renal), sliding scale at bed time, will switch back to home med regimen at discharge  Phantom Limb pain/ Peripheral Neuropathy: - Appreciate palliative care consult and have made the following changes based on their recommendations to better target her pain - Lidocaine pain to bilateral stumps  - Continue cymbalta - Continue hydromorphone 2 mg PRN as it is safer than tramadol in the setting of CKD.  Also Tramadol could be causing her intermittent nausea.  - Continue decreased dose of Lyrica to 25mg  at night  - Scheduled Tylenol TID   HTN  - continue home hydralazine and carvedilol   Hx of Seizure  - continue home keppra   Sleep Apnea  - continue bipap at night   Depression and anxiety:  - continue Ativan and sertraline   Morbid Obesity: patient is bed bound and does not move much  - Consult to PT   DVT Ppx:  - patient is on coumadin - daily PT-INR labs    Social:  - Per palliative patient and sister expressed concerns about going back to current SNF [facility]) and were considering changing to another facility.  But now are fine with going back to [facility]   This is a Careers information officer Note.  The care of the patient was discussed with Dr. Sherrine Maples and the assessment and plan formulated with their assistance.  Please see their attached note for official documentation of the daily encounter.   LOS: 8 days   Linton Ham, Med Student 03/19/2014, 12:14 PM

## 2014-03-19 NOTE — Progress Notes (Signed)
CSW Armed forces technical officer) spoke with facility and notified of potential for dc today. They are requesting for transport to be arranged for 2pm if possible. CSW paged MD to notify.  [place], [place]

## 2014-03-19 NOTE — Progress Notes (Signed)
Subjective: Patient resting during visit per palliative patient and patients sister are considering switching to a new SNF.    Objective: Vital signs in last 24 hours: Filed Vitals:   03/18/14 0548 03/18/14 1400 03/18/14 2000 03/18/14 2152  BP: 142/82 121/74  115/63  Pulse: 83 94  78  Temp: 97.2 F (36.2 C) 97.4 F (36.3 C)  97.5 F (36.4 C)  TempSrc: Oral Oral  Oral  Resp: 18 20 18 18   Height:      Weight: 121.11 kg (267 lb)     SpO2: 100% 100% 100% 100%   Weight change:   Intake/Output Summary (Last 24 hours) at 03/19/14 0007 Last data filed at 03/18/14 2248  Gross per 24 hour  Intake    333 ml  Output    950 ml  Net   -617 ml   Physical Exam  Gen: NAD, morbidly obese, resting in bed with her eyes closed.  HEENT: AT/[place], transdermal catheter is now removed, clean dry dressing now covering site  Heart: RRR, no murmur, rubs or gallops  Lungs: diminished breath sounds anteriorly, examination limited by patients body habitus (patient is unable to hold herself up right)  Abdomen: obese abdomen, soft, slightly tender BS+ but diminished  GU: pt has foley in place with minimal amounts of clear yellow urine  Extremities: right BKA, left AKA, Right arm smaller in size than right , Right arm has resting tremor  Neuro: somnolent but A&O x 3   Lab Results: Results for orders placed during the hospital encounter of 03/11/14 (from the past 24 hour(s))  PROTIME-INR     Status: Abnormal   Collection Time    03/18/14  3:11 AM      Result Value Ref Range   Prothrombin Time 30.8 (*) 11.6 - 15.2 seconds   INR 2.96 (*) 0.00 - 1.49  RENAL FUNCTION PANEL     Status: Abnormal   Collection Time    03/18/14  3:11 AM      Result Value Ref Range   Sodium 130 (*) 137 - 147 mEq/L   Potassium 3.7  3.7 - 5.3 mEq/L   Chloride 91 (*) 96 - 112 mEq/L   CO2 22  19 - 32 mEq/L   Glucose, Bld 87  70 - 99 mg/dL   BUN 35 (*) 6 - 23 mg/dL   Creatinine, Ser 2.81 (*) 0.50 - 1.10 mg/dL   Calcium 9.0   8.4 - 10.5 mg/dL   Phosphorus 2.8  2.3 - 4.6 mg/dL   Albumin 2.7 (*) 3.5 - 5.2 g/dL   GFR calc non Af Amer 18 (*) >90 mL/min   GFR calc Af Amer 21 (*) >90 mL/min   Anion gap 17 (*) 5 - 15  GLUCOSE, CAPILLARY     Status: None   Collection Time    03/18/14  7:32 AM      Result Value Ref Range   Glucose-Capillary 88  70 - 99 mg/dL  GLUCOSE, CAPILLARY     Status: Abnormal   Collection Time    03/18/14 11:28 AM      Result Value Ref Range   Glucose-Capillary 115 (*) 70 - 99 mg/dL  GLUCOSE, CAPILLARY     Status: Abnormal   Collection Time    03/18/14  4:40 PM      Result Value Ref Range   Glucose-Capillary 137 (*) 70 - 99 mg/dL  GLUCOSE, CAPILLARY     Status: None   Collection Time  03/18/14  9:53 PM      Result Value Ref Range   Glucose-Capillary 98  70 - 99 mg/dL   Comment 1 Notify RN     Micro Results: Recent Results (from the past 240 hour(s))  URINE CULTURE     Status: None   Collection Time    03/11/14 12:00 PM      Result Value Ref Range Status   Specimen Description URINE, CATHETERIZED   Final   Special Requests NONE   Final   Culture  Setup Time     Final   Value: 03/11/2014 12:42     Performed at [facility]     Final   Value: >=100,000 COLONIES/ML     Performed at [facility]   Culture     Final   Value: Multiple bacterial morphotypes present, none predominant. Suggest appropriate recollection if clinically indicated.     Performed at [facility]   Report Status 03/12/2014 FINAL   Final  CLOSTRIDIUM DIFFICILE BY PCR     Status: Abnormal   Collection Time    03/12/14 11:35 AM      Result Value Ref Range Status   C difficile by pcr POSITIVE (*) NEGATIVE Final   Comment: CRITICAL RESULT CALLED TO, READ BACK BY AND VERIFIED WITH:     Sheppard Evens RN 12:50 03/12/14 ([email or web address])   Studies/Results: No results found. Medications: I have reviewed the patient's current medications. Scheduled Meds: . acetaminophen  650 mg Oral  TID  . allopurinol  100 mg Oral Daily  . antiseptic oral rinse  7 mL Mouth Rinse q12n4p  . atorvastatin  20 mg Oral QHS  . carvedilol  3.125 mg Oral BID WC  . chlorhexidine  15 mL Mouth Rinse BID  . DULoxetine  20 mg Oral Daily  . famotidine  20 mg Oral Daily  . furosemide  160 mg Intravenous TID  . hydrALAZINE  10 mg Oral TID  . insulin aspart  0-5 Units Subcutaneous QHS  . insulin aspart  0-9 Units Subcutaneous TID WC  . isosorbide dinitrate  20 mg Oral TID  . levETIRAcetam  500 mg Oral BID  . lidocaine  2 patch Transdermal Q24H  . metolazone  5 mg Oral BID  . miconazole   Topical BID  . pregabalin  25 mg Oral QHS  . sodium chloride  3 mL Intravenous Q12H  . vancomycin  125 mg Oral QID  . Warfarin - Pharmacist Dosing Inpatient   Does not apply q1800   Continuous Infusions:  PRN Meds:.diazepam, gi cocktail, HYDROmorphone, ondansetron (ZOFRAN) IV Assessment/Plan: Principal Problem:   Acute on chronic systolic heart failure Active Problems:   Type 2 diabetes mellitus with complication   Anxiety state   GERD   Morbid obesity   Anemia   Essential hypertension   Chronic kidney disease, stage 3   History of CVA (cerebrovascular accident)   PVD (peripheral vascular disease)   History of seizure   Enteritis due to Clostridium difficile   Advanced directives, counseling/discussion   Phantom limb syndrome   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  Ms. Goedde is a 53 year old woman with multiple co-morbidities who presented after having sharp, burning chest pain during a dialysis treatment on 10/1.  Since the episode in the dialysis center there patient has not had a recurrence of this pain.  Also there have not been any changes in her EKG and her troponin has remained negative.  Her initial CXR on admission showed diffuse edema.  With aggressive diuresis this has been shown to be resolving on a subsequent CXR performed on 10/3.  Her urine output has not been significant, she is being closely  monitored by nephrology as well.  Per nephrology we changed her Lasix route from IV lasix 160 mg BID to IV Lasix 160 mg TID and have now added metolazone 5 mg BID to maximize her diuresis.  On her second day of admission she was found to be C.Diff PCR positive so she is currently completing a course of oral vancomycin,she is on day 7 of her 2 week course.    Acute on Chronic systolic heart failure: Patients had CXR on 10/3 which showed improvement from day of admission 10/01. She was likely volume overloaded secondary to an acute exacerbation of her heart failure.  Echo obtained on this admission also showed that her EF is 35-40% instead of 20% as was suggested from records obtained from [facility].  IV Lasix was started to address her fluid overload, and strict  Is& Os had been requested multiple times from nursing staff but had not been recorded accurately which makes it difficult to determine how much she has actually been diuresing.  An accurate assessment is also difficult to determine based on exam due to the patients body habitus (she is morbidly obese with a large pannus).  Can consider the patients chest pain at HD center to be resolved as it as not recurred ; it was most likely due to untreated GERD (pt has history of GERD but was not on medication) or volume overload as she is now breathing easier.   - Continue increased dose of IV Lasix 160 mg TID and start metolazone 5 mg BID per nephrology recommendations  - Continue home hydralazine and carvedilol; stop spironolactone per nephrology recommendations  - Strict Is & Os  - Daily weights  - Continue PO Pepcid 20 mg   - Continue nitro  - Continue telemetry monitoring   - Appreciate HF consult, will consider switching out IV Lasix 160 mg TID for demadex 100 bid if there continues to be no improvement in pt's urine output.   Clostridium difficile infection/Epigastric pain: Patient noted loosening of her stools on 10/2 and was C.Diff PCR  positive on 10/02. She is high risk for C.Diff given her decreased kidney function.  Risk for recurrence based on literature is also increased due to the patient's race (African American), multiple co-morbidies and age.  We also started the patient on a PPI 1 day prior to the C.diff diagnosis.  Prolonged PPI use is associated with an increase risk for developing a C.diff infection.  Studies have also shown that PPI and H2 blocker administration started during same hospital course as C.diff diagnosis or started after clearance of infection did not increase the probably of a C.diff recurrence with in the first 15-90 days post admission.  But we switched her to an H2 blocker due to her other risk factors that place her at an increased risk for relapsing C. Diff infection.   - Continue vancomycin po 125 mg four times per day for 10-14 days  - remain on enteric precautions  - CBC/ fever monitoring  - consulted SW, no complications exist for going back to SNF on oral vanc   CKD3: Patient was seen by nephrology who determined that she was not ESRD as previously thought. Also suggested stopping HD, cathter was removed.  They also recommended  stopping her sprinoloactone and going up on her IV Lasix dose. Patients creatinine has been trending up 1.34 to 2.13 to 2.21 to 2.33 to 2.53 while been aggressively dieresised, patient's baseline creatinine is ~2.  - Start IV Lasix 160 mg TID - continue monitoring renal function and strict I's & O's   GERD: Pt has hx of GERD but was not taking home med for this, She reports GERD- like symptoms here in hospital  - Discontinue protonix - Started pepcid today   Hyponatremia: Patient was found to be hyponatremic based on lab testing . Today patient is still hyponatremic to 130.  No change in the patients mental status, still A&O x 3 also has not been confused.  Her hyponatremia is likely due to diuresis or possibly volume overload  - continue to diurese    Iron def - Per  nephrology started patient on 510 mg feraheme  DMII: No change A1c 5.3% patient has BKA and AKA. DMII is controled with insulin  - Continue Lyrica but a decreased dose of 25 mg for neuropathy and phantom limb pain  - continue ISS (sensitive renal), sliding scale at bed time   Phantom Limb pain/ Peripheral Neuropathy: - Appreciate palliative care consult and have made the following changes based on their recommendations to better target her pain - Lidocaine pain to bilateral stumps  - Start cymbalta, stop sertraline - Stopping tramadol and starting hydromorphone 2 mg PRN as it is safer in the setting of CKD.  Also Tramadol could be causing her intermittent nausea.  - Decrease Lyrica to 25mg  at night  - Scheduled Tylenol TID   HTN  - continue home hydralazine and carvedilol   Hx of Seizure  - continue home keppra   Sleep Apnea  - continue bipap at night   Depression and anxiety:  - continue Ativan and sertraline   Morbid Obesity: patient is bed bound and does not move much  - Consult to PT   DVT Ppx:  - patient is on coumadin - daily PT-INR labs    Social:  - Per palliative patient and sister expressed concerns about going back to current SNF [facility]) and are considering changing to another facility.  Will need to reassess tomorrow. - SW is currently working with patient and family to come up with a solution   This is a Careers information officer Note.  The care of the patient was discussed with Dr. Sherrine Maples and the assessment and plan formulated with their assistance.  Please see their attached note for official documentation of the daily encounter.   LOS: 7 days   Linton Ham, Med Student 03/18/2014, 6:07 PM

## 2014-03-19 NOTE — Progress Notes (Signed)
ANTICOAGULATION CONSULT NOTE - Follow Up Consult  Pharmacy Consult for warfarin Indication: h/o CVA  Allergies  Allergen Reactions  . Ace Inhibitors Shortness Of Breath  . Latex Hives  . Lisinopril Other (See Comments)    Per MAR  . Vancomycin Other (See Comments)    Per [facility]    Patient Measurements: Height: 5' (152.4 cm) Weight: 248 lb 4.8 oz (112.628 kg) IBW/kg (Calculated) : 45.5  Vital Signs: Temp: 97.5 F (36.4 C) (10/09 0630) Temp Source: Oral (10/09 0630) BP: 115/89 mmHg (10/09 0630) Pulse Rate: 74 (10/09 0630)  Labs:  Recent Labs  03/17/14 0437 03/18/14 0311 03/19/14 0517  HGB 9.5*  --   --   HCT 29.8*  --   --   PLT 280  --   --   LABPROT 30.2* 30.8* 33.3*  INR 2.88* 2.96* 3.27*  CREATININE 2.53* 2.81* 2.85*    Estimated Creatinine Clearance: 26.7 ml/min (by C-G formula based on Cr of 2.85).   Medications:  Scheduled:  . acetaminophen  650 mg Oral TID  . allopurinol  100 mg Oral Daily  . antiseptic oral rinse  7 mL Mouth Rinse q12n4p  . atorvastatin  20 mg Oral QHS  . carvedilol  3.125 mg Oral BID WC  . chlorhexidine  15 mL Mouth Rinse BID  . DULoxetine  20 mg Oral Daily  . famotidine  20 mg Oral Daily  . furosemide  160 mg Intravenous TID  . hydrALAZINE  10 mg Oral TID  . insulin aspart  0-5 Units Subcutaneous QHS  . insulin aspart  0-9 Units Subcutaneous TID WC  . isosorbide dinitrate  20 mg Oral TID  . levETIRAcetam  500 mg Oral BID  . lidocaine  2 patch Transdermal Q24H  . metolazone  5 mg Oral BID  . miconazole   Topical BID  . pregabalin  25 mg Oral QHS  . sodium chloride  3 mL Intravenous Q12H  . vancomycin  125 mg Oral QID  . Warfarin - Pharmacist Dosing Inpatient   Does not apply q1800    Assessment: 53 yo f admitted on 10/1 after chest pain during HD. Patient is on warfarin PTA at a dose of 6 mg PO daily for history of a CVA. Pharmacy is consulted to dose and adjust warfarin while inpatient. Patient's INR on admission was 1.41.   Patient has received three doses of 10 mg (10/2-10/4) with INR up to 3.27 (home dose of 6mg  restarted 10/5 and dose was missed on 10/7).  Patient for SNF today  Goal of Therapy:  INR 2-3 Monitor platelets by anticoagulation protocol: Yes   Plan:  -hold coumadin today -If to SNF would consider holding coumadin 10/9 and 10/10 then make be able to restart at 6mg /day -Daily INR while inpatient  Hildred Laser, Pharm D 03/19/2014 10:49 AM

## 2014-03-19 NOTE — Progress Notes (Signed)
Appreciate palliative care discussion.  Note plan to return to a NH. No plans for HD.  I would DC on lasix 160mg  TID po and metolazone 5mg  BID.  Will sign off.

## 2014-03-22 NOTE — Progress Notes (Signed)
  I have seen and examined the patient myself, and I have reviewed the note by Theressa Stamps, MS 3 and was present during the interview and physical exam.  Please see my separate H&P for additional findings, assessment, and plan.   Signed: Drucilla Schmidt, MD 03/22/2014, 12:05 PM

## 2014-03-22 NOTE — Progress Notes (Signed)
  I have seen and examined the patient myself, and I have reviewed the note by Theressa Stamps, MS 3 and was present during the interview and physical exam.  Please see my separate H&P for additional findings, assessment, and plan.   Signed: Drucilla Schmidt, MD 03/22/2014, 12:06 PM

## 2014-04-05 ENCOUNTER — Emergency Department (HOSPITAL_COMMUNITY): Payer: Medicare Other

## 2014-04-05 ENCOUNTER — Encounter (HOSPITAL_COMMUNITY): Payer: Self-pay | Admitting: Emergency Medicine

## 2014-04-05 ENCOUNTER — Observation Stay (HOSPITAL_COMMUNITY)
Admission: EM | Admit: 2014-04-05 | Discharge: 2014-04-09 | Disposition: A | Discharge status: Skilled Nursing Facility | Payer: Medicare Other | Attending: Internal Medicine | Admitting: Internal Medicine

## 2014-04-05 DIAGNOSIS — I429 Cardiomyopathy, unspecified: Secondary | ICD-10-CM | POA: Diagnosis not present

## 2014-04-05 DIAGNOSIS — E119 Type 2 diabetes mellitus without complications: Secondary | ICD-10-CM | POA: Insufficient documentation

## 2014-04-05 DIAGNOSIS — M545 Low back pain: Secondary | ICD-10-CM | POA: Insufficient documentation

## 2014-04-05 DIAGNOSIS — Z881 Allergy status to other antibiotic agents status: Secondary | ICD-10-CM | POA: Insufficient documentation

## 2014-04-05 DIAGNOSIS — G609 Hereditary and idiopathic neuropathy, unspecified: Secondary | ICD-10-CM | POA: Diagnosis not present

## 2014-04-05 DIAGNOSIS — F419 Anxiety disorder, unspecified: Secondary | ICD-10-CM | POA: Insufficient documentation

## 2014-04-05 DIAGNOSIS — R55 Syncope and collapse: Secondary | ICD-10-CM

## 2014-04-05 DIAGNOSIS — G4730 Sleep apnea, unspecified: Secondary | ICD-10-CM | POA: Insufficient documentation

## 2014-04-05 DIAGNOSIS — Z87898 Personal history of other specified conditions: Secondary | ICD-10-CM

## 2014-04-05 DIAGNOSIS — E785 Hyperlipidemia, unspecified: Secondary | ICD-10-CM | POA: Diagnosis present

## 2014-04-05 DIAGNOSIS — I129 Hypertensive chronic kidney disease with stage 1 through stage 4 chronic kidney disease, or unspecified chronic kidney disease: Secondary | ICD-10-CM | POA: Diagnosis not present

## 2014-04-05 DIAGNOSIS — N184 Chronic kidney disease, stage 4 (severe): Secondary | ICD-10-CM | POA: Diagnosis not present

## 2014-04-05 DIAGNOSIS — Z89511 Acquired absence of right leg below knee: Secondary | ICD-10-CM | POA: Diagnosis not present

## 2014-04-05 DIAGNOSIS — F329 Major depressive disorder, single episode, unspecified: Secondary | ICD-10-CM | POA: Diagnosis not present

## 2014-04-05 DIAGNOSIS — Z8673 Personal history of transient ischemic attack (TIA), and cerebral infarction without residual deficits: Secondary | ICD-10-CM | POA: Diagnosis not present

## 2014-04-05 DIAGNOSIS — D649 Anemia, unspecified: Secondary | ICD-10-CM | POA: Diagnosis not present

## 2014-04-05 DIAGNOSIS — E118 Type 2 diabetes mellitus with unspecified complications: Secondary | ICD-10-CM | POA: Diagnosis present

## 2014-04-05 DIAGNOSIS — I4891 Unspecified atrial fibrillation: Secondary | ICD-10-CM | POA: Insufficient documentation

## 2014-04-05 DIAGNOSIS — Z8614 Personal history of Methicillin resistant Staphylococcus aureus infection: Secondary | ICD-10-CM | POA: Insufficient documentation

## 2014-04-05 DIAGNOSIS — I739 Peripheral vascular disease, unspecified: Secondary | ICD-10-CM | POA: Insufficient documentation

## 2014-04-05 DIAGNOSIS — M1990 Unspecified osteoarthritis, unspecified site: Secondary | ICD-10-CM | POA: Insufficient documentation

## 2014-04-05 DIAGNOSIS — Z515 Encounter for palliative care: Secondary | ICD-10-CM

## 2014-04-05 DIAGNOSIS — Z79899 Other long term (current) drug therapy: Secondary | ICD-10-CM | POA: Insufficient documentation

## 2014-04-05 DIAGNOSIS — Z7189 Other specified counseling: Secondary | ICD-10-CM

## 2014-04-05 DIAGNOSIS — F418 Other specified anxiety disorders: Secondary | ICD-10-CM

## 2014-04-05 DIAGNOSIS — G8929 Other chronic pain: Secondary | ICD-10-CM | POA: Diagnosis not present

## 2014-04-05 DIAGNOSIS — Z89612 Acquired absence of left leg above knee: Secondary | ICD-10-CM | POA: Insufficient documentation

## 2014-04-05 DIAGNOSIS — K219 Gastro-esophageal reflux disease without esophagitis: Secondary | ICD-10-CM | POA: Diagnosis present

## 2014-04-05 DIAGNOSIS — Z86718 Personal history of other venous thrombosis and embolism: Secondary | ICD-10-CM

## 2014-04-05 DIAGNOSIS — R000 Tachycardia, unspecified: Secondary | ICD-10-CM | POA: Diagnosis not present

## 2014-04-05 DIAGNOSIS — R5383 Other fatigue: Secondary | ICD-10-CM | POA: Diagnosis present

## 2014-04-05 DIAGNOSIS — Z7901 Long term (current) use of anticoagulants: Secondary | ICD-10-CM | POA: Diagnosis not present

## 2014-04-05 DIAGNOSIS — N179 Acute kidney failure, unspecified: Secondary | ICD-10-CM | POA: Insufficient documentation

## 2014-04-05 DIAGNOSIS — R531 Weakness: Secondary | ICD-10-CM

## 2014-04-05 DIAGNOSIS — Z91040 Latex allergy status: Secondary | ICD-10-CM | POA: Insufficient documentation

## 2014-04-05 DIAGNOSIS — Z888 Allergy status to other drugs, medicaments and biological substances status: Secondary | ICD-10-CM | POA: Insufficient documentation

## 2014-04-05 DIAGNOSIS — E669 Obesity, unspecified: Secondary | ICD-10-CM | POA: Diagnosis not present

## 2014-04-05 DIAGNOSIS — I5023 Acute on chronic systolic (congestive) heart failure: Secondary | ICD-10-CM

## 2014-04-05 DIAGNOSIS — I5022 Chronic systolic (congestive) heart failure: Secondary | ICD-10-CM | POA: Diagnosis not present

## 2014-04-05 DIAGNOSIS — I10 Essential (primary) hypertension: Secondary | ICD-10-CM | POA: Diagnosis present

## 2014-04-05 DIAGNOSIS — I5020 Unspecified systolic (congestive) heart failure: Secondary | ICD-10-CM

## 2014-04-05 DIAGNOSIS — R197 Diarrhea, unspecified: Secondary | ICD-10-CM

## 2014-04-05 DIAGNOSIS — I509 Heart failure, unspecified: Secondary | ICD-10-CM

## 2014-04-05 DIAGNOSIS — I82409 Acute embolism and thrombosis of unspecified deep veins of unspecified lower extremity: Secondary | ICD-10-CM | POA: Diagnosis not present

## 2014-04-05 DIAGNOSIS — R1110 Vomiting, unspecified: Secondary | ICD-10-CM

## 2014-04-05 DIAGNOSIS — R569 Unspecified convulsions: Secondary | ICD-10-CM

## 2014-04-05 LAB — BASIC METABOLIC PANEL
ANION GAP: 19 — AB (ref 5–15)
Anion gap: 18 — ABNORMAL HIGH (ref 5–15)
Anion gap: 18 — ABNORMAL HIGH (ref 5–15)
BUN: 109 mg/dL — ABNORMAL HIGH (ref 6–23)
BUN: 103 mg/dL — ABNORMAL HIGH (ref 6–23)
BUN: 101 mg/dL — AB (ref 6–23)
CALCIUM: 9.3 mg/dL (ref 8.4–10.5)
CALCIUM: 9.3 mg/dL (ref 8.4–10.5)
CHLORIDE: 97 meq/L (ref 96–112)
CHLORIDE: 93 meq/L — AB (ref 96–112)
CO2: 31 mEq/L (ref 19–32)
CO2: 29 meq/L (ref 19–32)
CO2: 29 mEq/L (ref 19–32)
CREATININE: 3.59 mg/dL — AB (ref 0.50–1.10)
Calcium: 9.8 mg/dL (ref 8.4–10.5)
Chloride: 95 mEq/L — ABNORMAL LOW (ref 96–112)
Creatinine, Ser: 3.88 mg/dL — ABNORMAL HIGH (ref 0.50–1.10)
Creatinine, Ser: 3.65 mg/dL — ABNORMAL HIGH (ref 0.50–1.10)
GFR calc Af Amer: 14 mL/min — ABNORMAL LOW (ref >90)
GFR calc Af Amer: 16 mL/min — ABNORMAL LOW (ref >90)
GFR calc Af Amer: 16 mL/min — ABNORMAL LOW (ref >90)
GFR calc non Af Amer: 12 mL/min — ABNORMAL LOW (ref >90)
GFR calc non Af Amer: 14 mL/min — ABNORMAL LOW (ref >90)
GFR, EST NON AFRICAN AMERICAN: 13 mL/min — AB (ref >90)
GLUCOSE: 142 mg/dL — AB (ref 70–99)
Glucose, Bld: 123 mg/dL — ABNORMAL HIGH (ref 70–99)
Glucose, Bld: 193 mg/dL — ABNORMAL HIGH (ref 70–99)
Potassium: 4.1 mEq/L (ref 3.7–5.3)
Potassium: 5.7 mEq/L — ABNORMAL HIGH (ref 3.7–5.3)
Potassium: 4.1 mEq/L (ref 3.7–5.3)
SODIUM: 147 meq/L (ref 137–147)
Sodium: 140 mEq/L (ref 137–147)
Sodium: 142 mEq/L (ref 137–147)

## 2014-04-05 LAB — TROPONIN I
Troponin I: <0.3 ng/mL (ref <0.30)
Troponin I: <0.3 ng/mL (ref <0.30)

## 2014-04-05 LAB — CBC WITH DIFFERENTIAL/PLATELET
BASOS ABS: 0.1 10*3/uL (ref 0.0–0.1)
Basophils Relative: 1 % (ref 0–1)
EOS ABS: 0.3 10*3/uL (ref 0.0–0.7)
EOS PCT: 4 % (ref 0–5)
HCT: 42.8 % (ref 36.0–46.0)
Hemoglobin: 13.2 g/dL (ref 12.0–15.0)
LYMPHS ABS: 1.7 10*3/uL (ref 0.7–4.0)
Lymphocytes Relative: 26 % (ref 12–46)
MCH: 28.6 pg (ref 26.0–34.0)
MCHC: 30.8 g/dL (ref 30.0–36.0)
MCV: 92.6 fL (ref 78.0–100.0)
Monocytes Absolute: 0.6 10*3/uL (ref 0.1–1.0)
Monocytes Relative: 10 % (ref 3–12)
NEUTROS PCT: 59 % (ref 43–77)
Neutro Abs: 3.9 10*3/uL (ref 1.7–7.7)
PLATELETS: 360 10*3/uL (ref 150–400)
RBC: 4.62 MIL/uL (ref 3.87–5.11)
RDW: 19 % — ABNORMAL HIGH (ref 11.5–15.5)
WBC: 6.6 10*3/uL (ref 4.0–10.5)

## 2014-04-05 LAB — URINE MICROSCOPIC-ADD ON

## 2014-04-05 LAB — URINALYSIS, ROUTINE W REFLEX MICROSCOPIC
BILIRUBIN URINE: NEGATIVE
Bilirubin Urine: NEGATIVE
Glucose, UA: NEGATIVE mg/dL
Glucose, UA: 100 mg/dL — AB
Ketones, ur: NEGATIVE mg/dL
Ketones, ur: NEGATIVE mg/dL
NITRITE: NEGATIVE
Nitrite: NEGATIVE
SPECIFIC GRAVITY, URINE: 1.015 (ref 1.005–1.030)
Specific Gravity, Urine: 1.013 (ref 1.005–1.030)
UROBILINOGEN UA: 0.2 mg/dL (ref 0.0–1.0)
Urobilinogen, UA: 0.2 mg/dL (ref 0.0–1.0)
pH: 6.5 (ref 5.0–8.0)
pH: 7.5 (ref 5.0–8.0)

## 2014-04-05 LAB — I-STAT TROPONIN, ED: Troponin i, poc: 0.05 ng/mL (ref 0.00–0.08)

## 2014-04-05 LAB — I-STAT CHEM 8, ED
BUN: 97 mg/dL — AB (ref 6–23)
CHLORIDE: 100 meq/L (ref 96–112)
Calcium, Ion: 1.06 mmol/L — ABNORMAL LOW (ref 1.12–1.23)
Creatinine, Ser: 3.6 mg/dL — ABNORMAL HIGH (ref 0.50–1.10)
Glucose, Bld: 128 mg/dL — ABNORMAL HIGH (ref 70–99)
HEMATOCRIT: 48 % — AB (ref 36.0–46.0)
Hemoglobin: 16.3 g/dL — ABNORMAL HIGH (ref 12.0–15.0)
POTASSIUM: 3.9 meq/L (ref 3.7–5.3)
SODIUM: 145 meq/L (ref 137–147)
TCO2: 33 mmol/L (ref 0–100)

## 2014-04-05 LAB — COMPREHENSIVE METABOLIC PANEL
ALT: 26 U/L (ref 0–35)
AST: 51 U/L — ABNORMAL HIGH (ref 0–37)
Albumin: 3.3 g/dL — ABNORMAL LOW (ref 3.5–5.2)
Alkaline Phosphatase: 117 U/L (ref 39–117)
Anion gap: 18 — ABNORMAL HIGH (ref 5–15)
BUN: 106 mg/dL — ABNORMAL HIGH (ref 6–23)
CO2: 32 mEq/L (ref 19–32)
Calcium: 9.7 mg/dL (ref 8.4–10.5)
Chloride: 97 mEq/L (ref 96–112)
Creatinine, Ser: 3.9 mg/dL — ABNORMAL HIGH (ref 0.50–1.10)
GFR calc Af Amer: 14 mL/min — ABNORMAL LOW (ref >90)
GFR calc non Af Amer: 12 mL/min — ABNORMAL LOW (ref >90)
GLUCOSE: 123 mg/dL — AB (ref 70–99)
POTASSIUM: 4 meq/L (ref 3.7–5.3)
Sodium: 147 mEq/L (ref 137–147)
TOTAL PROTEIN: 8.9 g/dL — AB (ref 6.0–8.3)
Total Bilirubin: 0.6 mg/dL (ref 0.3–1.2)

## 2014-04-05 LAB — CK: CK TOTAL: 28 U/L (ref 7–177)

## 2014-04-05 LAB — PROTIME-INR
INR: 2.19 — AB (ref 0.00–1.49)
Prothrombin Time: 24.6 seconds — ABNORMAL HIGH (ref 11.6–15.2)

## 2014-04-05 LAB — CBG MONITORING, ED: GLUCOSE-CAPILLARY: 126 mg/dL — AB (ref 70–99)

## 2014-04-05 LAB — I-STAT CG4 LACTIC ACID, ED: LACTIC ACID, VENOUS: 1.69 mmol/L (ref 0.5–2.2)

## 2014-04-05 MED ORDER — DULOXETINE HCL 20 MG PO CPEP
20.0000 mg | ORAL_CAPSULE | Freq: Every day | ORAL | Status: DC
  Administered 2014-04-05 – 2014-04-09 (×5): 20 mg via ORAL
  Filled 2014-04-05 (×5): qty 1

## 2014-04-05 MED ORDER — METOLAZONE 5 MG PO TABS
5.0000 mg | ORAL_TABLET | Freq: Two times a day (BID) | ORAL | Status: DC
  Filled 2014-04-05 (×3): qty 1

## 2014-04-05 MED ORDER — LEVETIRACETAM 500 MG PO TABS
500.0000 mg | ORAL_TABLET | Freq: Two times a day (BID) | ORAL | Status: DC
  Administered 2014-04-05 – 2014-04-09 (×9): 500 mg via ORAL
  Filled 2014-04-05 (×10): qty 1

## 2014-04-05 MED ORDER — ATORVASTATIN CALCIUM 20 MG PO TABS
20.0000 mg | ORAL_TABLET | Freq: Every day | ORAL | Status: DC
  Administered 2014-04-05 – 2014-04-08 (×4): 20 mg via ORAL
  Filled 2014-04-05 (×5): qty 1

## 2014-04-05 MED ORDER — SENNOSIDES-DOCUSATE SODIUM 8.6-50 MG PO TABS: 1.0000 | ORAL_TABLET | Freq: Every evening | ORAL | Status: DC | PRN

## 2014-04-05 MED ORDER — WARFARIN SODIUM 1 MG PO TABS
1.5000 mg | ORAL_TABLET | Freq: Every day | ORAL | Status: DC
  Administered 2014-04-05: 1.5 mg via ORAL
  Filled 2014-04-05 (×2): qty 1

## 2014-04-05 MED ORDER — SODIUM CHLORIDE 0.9 % IJ SOLN
3.0000 mL | Freq: Two times a day (BID) | INTRAMUSCULAR | Status: DC
  Administered 2014-04-05 – 2014-04-08 (×5): 3 mL via INTRAVENOUS

## 2014-04-05 MED ORDER — HYDRALAZINE HCL 10 MG PO TABS
10.0000 mg | ORAL_TABLET | Freq: Three times a day (TID) | ORAL | Status: DC
Start: 2014-04-05 — End: 2014-04-09
  Administered 2014-04-05 – 2014-04-09 (×14): 10 mg via ORAL
  Filled 2014-04-05 (×15): qty 1

## 2014-04-05 MED ORDER — FERROUS SULFATE 325 (65 FE) MG PO TABS
325.0000 mg | ORAL_TABLET | Freq: Two times a day (BID) | ORAL | Status: DC
  Administered 2014-04-05 – 2014-04-09 (×9): 325 mg via ORAL
  Filled 2014-04-05 (×11): qty 1

## 2014-04-05 MED ORDER — FUROSEMIDE 80 MG PO TABS: 160.0000 mg | ORAL_TABLET | Freq: Three times a day (TID) | ORAL | Status: DC

## 2014-04-05 MED ORDER — CARVEDILOL 3.125 MG PO TABS
3.1250 mg | ORAL_TABLET | Freq: Two times a day (BID) | ORAL | Status: DC
  Administered 2014-04-05 – 2014-04-09 (×10): 3.125 mg via ORAL
  Filled 2014-04-05 (×14): qty 1

## 2014-04-05 MED ORDER — DEXTROSE 5 % IV SOLN
1.0000 g | Freq: Once | INTRAVENOUS | Status: AC
  Administered 2014-04-05: 1 g via INTRAVENOUS
  Filled 2014-04-05: qty 10

## 2014-04-05 MED ORDER — ISOSORBIDE DINITRATE 20 MG PO TABS
20.0000 mg | ORAL_TABLET | Freq: Three times a day (TID) | ORAL | Status: DC
  Administered 2014-04-05 – 2014-04-09 (×12): 20 mg via ORAL
  Filled 2014-04-05 (×16): qty 1

## 2014-04-05 MED ORDER — WARFARIN - PHARMACIST DOSING INPATIENT
Freq: Every day | Status: DC
  Administered 2014-04-06 – 2014-04-08 (×3)

## 2014-04-05 MED ORDER — SODIUM CHLORIDE 0.9 % IV BOLUS (SEPSIS)
500.0000 mL | Freq: Once | INTRAVENOUS | Status: AC
  Administered 2014-04-05: 500 mL via INTRAVENOUS

## 2014-04-05 MED ORDER — PRO-STAT SUGAR FREE PO LIQD
30.0000 mL | Freq: Every day | ORAL | Status: DC
  Administered 2014-04-06 – 2014-04-07 (×2): 30 mL via ORAL
  Administered 2014-04-09: 12:00:00 via ORAL
  Filled 2014-04-05 (×5): qty 30

## 2014-04-05 NOTE — Consult Note (Signed)
Reason for Consult: AKI on CKD Referring Physician: Dr. Posey Pronto Primary Nephrologist: [facility]  HPI: [name] is an 53 y.o. female with PMH CKD, CVA, DM, CHF (EF 30%), PVD (bilateral amputee), HTN, HLD, afib on warfarin. She was admitted 04/05/2014 for reported syncope however sister in law at bedside states this is not correct; she was brought in because the family was concerned she was not getting appropriate care and she had started to act quite differently, stopped eating, and appeared to be dehydrated. She currently complains of pain in her legs, feels her breathing is okay. Family is interested in taking her to a different SNF on discharge.  She was followed by a nephrologist in [facility] and initiated HD near the end of September. Seen by Dr. Mercy Moore during last hospitalization 10/9, d/c'd with no plans for HD, continue lasix 160mg  TID and metolazone 5mg  BID. She was discharged with anticipation of being followed by palliative medicine at SNF, however it appears she did not qualify for this. Dr. Etheleen Nicks note from 10/9 mentions comfort care being primary goal, however palliative note from 10/8 mentions FULL CODE and goals being avoiding re-hospitalization and "aggressive treatment of all reversible conditions", no hemodialysis.    Trend in Creatinine: Creatinine, Ser  Date/Time Value Ref Range Status  04/05/2014  7:29 AM 3.88* 0.50 - 1.10 mg/dL Final  04/05/2014  1:34 AM 3.60* 0.50 - 1.10 mg/dL Final  04/05/2014  1:23 AM 3.90* 0.50 - 1.10 mg/dL Final  03/19/2014  5:17 AM 2.85* 0.50 - 1.10 mg/dL Final  03/18/2014  3:11 AM 2.81* 0.50 - 1.10 mg/dL Final  03/17/2014  4:37 AM 2.53* 0.50 - 1.10 mg/dL Final  03/16/2014  5:04 AM 2.33* 0.50 - 1.10 mg/dL Final  03/15/2014  4:38 AM 2.21* 0.50 - 1.10 mg/dL Final  03/14/2014 12:49 PM 2.18* 0.50 - 1.10 mg/dL Final  03/14/2014  3:08 AM 2.13* 0.50 - 1.10 mg/dL Final  03/13/2014  5:07 AM 1.86* 0.50 - 1.10 mg/dL Final  03/12/2014  5:00 AM  1.62* 0.50 - 1.10 mg/dL Final  03/11/2014 12:02 PM 1.40* 0.50 - 1.10 mg/dL Final  03/11/2014 11:45 AM 1.34* 0.50 - 1.10 mg/dL Final  03/19/2013  6:16 AM 1.43* 0.50 - 1.10 mg/dL Final  03/18/2013  7:08 AM 1.47* 0.50 - 1.10 mg/dL Final  01/28/2013  6:46 AM 1.34* 0.50 - 1.10 mg/dL Final  01/14/2013 11:29 AM 2.3* 0.4 - 1.2 mg/dL Final  08/01/2012  1:00 PM 1.45* 0.50 - 1.10 mg/dL Final  01/18/2012 11:56 AM 1.28* 0.50 - 1.10 mg/dL Final  10/25/2011 11:33 AM 1.32* 0.50 - 1.10 mg/dL Final  10/24/2011  5:55 AM 1.36* 0.50 - 1.10 mg/dL Final  10/23/2011  7:16 AM 1.55* 0.50 - 1.10 mg/dL Final  10/22/2011 10:24 PM 1.66* 0.50 - 1.10 mg/dL Final  10/22/2011  4:04 AM 1.84* 0.50 - 1.10 mg/dL Final  10/21/2011  3:43 AM 2.00* 0.50 - 1.10 mg/dL Final  10/20/2011  4:00 AM 1.55* 0.50 - 1.10 mg/dL Final  10/19/2011  6:35 PM 1.42* 0.50 - 1.10 mg/dL Final  10/18/2011 10:28 AM 1.10  0.50 - 1.10 mg/dL Final  10/11/2011  8:42 AM 0.92  0.50 - 1.10 mg/dL Final  10/11/2011  7:18 AM 1.10  0.50 - 1.10 mg/dL Final  09/14/2011 11:52 AM 0.97  0.50 - 1.10 mg/dL Final  09/13/2011  4:15 PM 0.97  0.50 - 1.10 mg/dL Final  08/16/2011  5:10 AM 0.89  0.50 - 1.10 mg/dL Final  08/15/2011  5:05 AM  0.93  0.50 - 1.10 mg/dL Final  08/14/2011  6:23 AM 1.13* 0.50 - 1.10 mg/dL Final  08/13/2011  2:45 PM 1.31* 0.50 - 1.10 mg/dL Final  08/09/2011 12:44 PM 1.06  0.50 - 1.10 mg/dL Final  05/30/2011  3:30 PM 1.8* 0.4 - 1.2 mg/dL Final  05/11/2011  6:41 AM 1.10  0.50 - 1.10 mg/dL Final  04/27/2011  7:00 AM 1.44* 0.50 - 1.10 mg/dL Final  04/26/2011  6:23 AM 1.68* 0.50 - 1.10 mg/dL Final  04/25/2011  5:51 AM 2.04* 0.50 - 1.10 mg/dL Final  04/24/2011  6:25 AM 1.74* 0.50 - 1.10 mg/dL Final  04/23/2011  3:59 AM 1.44* 0.50 - 1.10 mg/dL Final  04/22/2011  3:45 PM 1.44* 0.50 - 1.10 mg/dL Final  06/23/2010  6:57 AM 1.22* 0.4 - 1.2 mg/dL Final  06/20/2010  5:35 AM 1.30* 0.4 - 1.2 mg/dL Final  06/19/2010  6:18 AM 1.19  0.4 - 1.2 mg/dL Final  06/18/2010  3:24 AM 1.11  0.4 - 1.2 mg/dL Final   06/17/2010  5:00 AM 1.07  0.4 - 1.2 mg/dL Final  06/13/2010  4:43 AM 1.02  0.4 - 1.2 mg/dL Final    PMH:   Past Medical History  Diagnosis Date  . HTN (hypertension)   . HLD (hyperlipidemia)   . History of methicillin resistant staphylococcus aureus (MRSA)   . CVA (cerebral infarction)   . Anemia   . Tachycardia   . Obesity   . Lower back pain     Chronic  . Idiopathic peripheral neuropathy     NOS  . DJD (degenerative joint disease)   . Headache(784.0)   . GERD (gastroesophageal reflux disease)   . Depression   . Anxiety   . Hypokalemia     resolved  . Cerebral infarct     left parietal and bilateral  . Esophagitis     distal  . Fluid overload     compensated  . Urinary retention   . H/O: pneumonia   . History of bacteremia   . Surgery, other elective     of the ear  . Stroke     Hx of left frontoparietal stroke in 12/11. History of right brain stroke.  . Cardiomyopathy     Echocardiogram 04/23/11: Mild LVH, EF 30-35%, mild MR, mild LAE, mild-moderate RVE.  TEE 04/25/11: Moderate LVH, severe biventricular dysfunction, EF 20-25%, moderate to severe MR, moderate TR, biatrial enlargement, no PFO, negative bubble study  . Shortness of breath     with exertion   . Sleep apnea     no machine used  pt does not remember test for sleep apnea  . DM type 2 (diabetes mellitus, type 2)     type two   oral meds only   . 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 UTI (lower urinary tract infection) 10/19/11    had approx April 2013  . Osteomyelitis   . DJD (degenerative joint disease)   . Urinary retention   . Pneumonia   . Peripheral vascular disease     PSH:   Past Surgical History  Procedure Laterality Date  . Tympanoplasty    . Toe amputation      left 2nd toe  . Dilation and curettage of uterus      history  . Tee without cardioversion  04/25/2011    Procedure: TRANSESOPHAGEAL ECHOCARDIOGRAM (TEE);  Surgeon: Jolaine Artist, MD;  Location: [facility] ENDOSCOPY;  Service: Cardiovascular;  Laterality: N/A;  . Esophagogastroduodenoscopy  08/14/2011    Procedure: ESOPHAGOGASTRODUODENOSCOPY (EGD);  Surgeon: Scarlette Shorts, MD;  [facility] ENDOSCOPY;  Service: Endoscopy;  Laterality: N/A;  . Amputation  01/18/2012    Procedure: AMPUTATION BELOW KNEE;  Surgeon: Newt Minion, MD;  Location: [facility];  Service: Orthopedics;  Laterality: Left;  Left Below Knee Amputation  . Amputation Right 08/01/2012    Procedure: Right Below Knee Amputation;  Surgeon: Newt Minion, MD;  Location: [place];  Service: Orthopedics;  Laterality: Right;  Right Below Knee Amputation  . Pars plana vitrectomy Left 01/28/2013    Procedure: PARS PLANA VITRECTOMY WITH 23 GAUGE;  Surgeon: Adonis Brook, MD;  Location: [place];  Service: Ophthalmology;  Laterality: Left;  . Photocoagulation with laser Left 01/28/2013    Procedure: PHOTOCOAGULATION WITH LASER;  Surgeon: Adonis Brook, MD;  Location: [place];  Service: Ophthalmology;  Laterality: Left;  ENDOLASER  . Membrane peel Left 01/28/2013    Procedure: MEMBRANE PEEL;  Surgeon: Adonis Brook, MD;  Location: [place];  Service: Ophthalmology;  Laterality: Left;  . Amputation Left 03/18/2013    Procedure: AMPUTATION ABOVE KNEE;  Surgeon: Newt Minion, MD;  Location: [place];  Service: Orthopedics;  Laterality: Left;    Allergies:  Allergies  Allergen Reactions  . Ace Inhibitors Shortness Of Breath  . Latex Hives  . Lisinopril Other (See Comments)    Per MAR  . Vancomycin Other (See Comments)    Per MAR    Medications:   Prior to Admission medications   Medication Sig Start Date End Date Taking? Authorizing Provider  acetaminophen (TYLENOL) 325 MG tablet Take 2 tablets (650 mg total) by mouth 3 (three) times daily. 03/19/14  Yes Drucilla Schmidt, MD  allopurinol (ZYLOPRIM) 100 MG tablet Take 100 mg by mouth daily.   Yes Historical Provider, MD  ALPRAZolam (XANAX) 0.25 MG tablet Take 0.25 mg by mouth every 8 (eight) hours as needed for anxiety.   Yes  Historical Provider, MD  Amino Acids-Protein Hydrolys (FEEDING SUPPLEMENT, PRO-STAT SUGAR FREE 64,) LIQD Take 30 mLs by mouth daily.   Yes Historical Provider, MD  atorvastatin (LIPITOR) 20 MG tablet Take 20 mg by mouth at bedtime.   Yes Historical Provider, MD  carvedilol (COREG) 3.125 MG tablet Take 3.125 mg by mouth 2 (two) times daily with a meal.   Yes Historical Provider, MD  DULoxetine (CYMBALTA) 20 MG capsule Take 1 capsule (20 mg total) by mouth daily. 03/19/14  Yes Drucilla Schmidt, MD  ferrous sulfate 325 (65 FE) MG tablet Take 325 mg by mouth 2 (two) times daily.    Yes Historical Provider, MD  furosemide (LASIX) 80 MG tablet Take 2 tablets (160 mg total) by mouth 3 (three) times daily. 03/19/14  Yes Drucilla Schmidt, MD  hydrALAZINE (APRESOLINE) 10 MG tablet Take 10 mg by mouth 3 (three) times daily.   Yes Historical Provider, MD  HYDROmorphone (DILAUDID) 2 MG tablet Take 1 tablet (2 mg total) by mouth every 4 (four) hours as needed for moderate pain or severe pain. 03/19/14  Yes Drucilla Schmidt, MD  isosorbide dinitrate (ISORDIL) 20 MG tablet Take 20 mg by mouth 3 (three) times daily.   Yes Historical Provider, MD  levETIRAcetam (KEPPRA) 500 MG tablet Take 500 mg by mouth 2 (two) times daily.   Yes Historical Provider, MD  Menthol, Topical Analgesic, (ICY HOT) 5 % PADS Apply 1 patch topically daily. Apply to each stump   Yes Historical  Provider, MD  metolazone (ZAROXOLYN) 5 MG tablet Take 1 tablet (5 mg total) by mouth 2 (two) times daily. 03/19/14  Yes Drucilla Schmidt, MD  MICONAZOLE NITRATE EX Apply 1 application topically 2 (two) times daily.   Yes Historical Provider, MD  Multiple Vitamin (MULTIVITAMIN WITH MINERALS) TABS tablet Take 1 tablet by mouth daily.   Yes Historical Provider, MD  pregabalin (LYRICA) 25 MG capsule Take 1 capsule (25 mg total) by mouth at bedtime. 03/19/14  Yes Drucilla Schmidt, MD  tiZANidine (ZANAFLEX) 4 MG tablet Take 4 mg by mouth every 8 (eight) hours as needed for muscle  spasms.   Yes Historical Provider, MD  warfarin (COUMADIN) 3 MG tablet Take 1.5 mg by mouth at bedtime.   Yes Historical Provider, MD  Alum & Mag Hydroxide-Simeth (GI COCKTAIL) SUSP suspension Take 30 mLs by mouth 2 (two) times daily as needed for indigestion (Chest Pain). Shake well. 03/19/14   Drucilla Schmidt, MD  senna-docusate (SENOKOT-S) 8.6-50 MG per tablet Take 1 tablet by mouth at bedtime as needed for mild constipation. 03/19/14   Drucilla Schmidt, MD    Inpatient medications: . atorvastatin  20 mg Oral QHS  . carvedilol  3.125 mg Oral BID WC  . DULoxetine  20 mg Oral Daily  . feeding supplement (PRO-STAT SUGAR FREE 64)  30 mL Oral Daily  . ferrous sulfate  325 mg Oral BID  . hydrALAZINE  10 mg Oral TID  . isosorbide dinitrate  20 mg Oral TID  . levETIRAcetam  500 mg Oral BID  . sodium chloride  3 mL Intravenous Q12H  . warfarin  1.5 mg Oral q1800  . Warfarin - Pharmacist Dosing Inpatient   Does not apply q1800    Discontinued Meds:   Medications Discontinued During This Encounter  Medication Reason  . vancomycin (VANCOCIN) 50 mg/mL oral solution Discontinued by provider  . warfarin (COUMADIN) 6 MG tablet Dose change  . lidocaine (LIDODERM) 5 % Discontinued by provider  . famotidine (PEPCID) 10 MG tablet Discontinued by provider  . diazepam (VALIUM) 2 MG tablet Discontinued by provider  . furosemide (LASIX) tablet 160 mg   . metolazone (ZAROXOLYN) tablet 5 mg     Social History:  reports that she has never smoked. She has never used smokeless tobacco. She reports that she drinks alcohol. She reports that she does not use illicit drugs.  Family History:   Family History  Problem Relation Age of Onset  . Coronary artery disease    . Diabetes    . Cancer Mother   . Diabetes Mother   . Heart disease Father   . Hyperlipidemia Father   . Hypertension Father   . Stroke Father   . Heart disease Brother   . Anesthesia problems Neg Hx   . Hypotension Neg Hx   . Malignant  hyperthermia Neg Hx   . Pseudochol deficiency Neg Hx     A comprehensive review of systems was negative except for: Constitutional: positive for fatigue Gastrointestinal: positive for diarrhea Neurological: positive for phantom limb pain Weight change:   Intake/Output Summary (Last 24 hours) at 04/05/14 1456 Last data filed at 04/05/14 1100  Gross per 24 hour  Intake    290 ml  Output      0 ml  Net    290 ml   BP 149/64  Pulse 85  Temp(Src) 97.8 F (36.6 C) (Oral)  Resp 18  Wt 186 lb 4.8 oz (84.505 kg)  SpO2 100% Filed Vitals:  04/05/14 0800 04/05/14 0849 04/05/14 0914 04/05/14 1324  BP: 151/136 153/75 149/64   Pulse: 83  86 85  Temp:   97.6 F (36.4 C) 97.8 F (36.6 C)  TempSrc:   Oral Oral  Resp: 14  19 18   Weight:   186 lb 4.8 oz (84.505 kg)   SpO2: 100%  99% 100%     General appearance: cooperative, appears older than stated age, fatigued, no distress, moderately obese and slowed mentation Head: Normocephalic, without obvious abnormality, atraumatic, blind, occluded lenses, scarring visible in retinas. Throat: mildly dry mucous membranes Resp: mild end expiratory wheeze, normal effort. Decreased bs Cardio: RRR, quiet heart sounds, no murmur appreciated  Gr 2/6 M GI: obese, mildly tender LLQ, no rebound. +BS liver down 5 cm Extremities: left AKA, right BKA. trace edema in legs, presacral edema present Skin: Skin color, texture, turgor normal. No rashes or lesions Neurologic: alert/oriented. Resting right hand tremor. Follows commands appropriately. Left hemiplegia from prior CVA. 2+ presacral edema Labs: Basic Metabolic Panel:  Recent Labs Lab 04/05/14 0123 04/05/14 0134 04/05/14 0729  NA 147 145 147  K 4.0 3.9 4.1  CL 97 100 97  CO2 32  --  31  GLUCOSE 123* 128* 123*  BUN 106* 97* 109*  CREATININE 3.90* 3.60* 3.88*  ALBUMIN 3.3*  --   --   CALCIUM 9.7  --  9.8   Liver Function Tests:  Recent Labs Lab 04/05/14 0123  AST 51*  ALT 26  ALKPHOS  117  BILITOT 0.6  PROT 8.9*  ALBUMIN 3.3*   No results found for this basename: LIPASE, AMYLASE,  in the last 168 hours No results found for this basename: AMMONIA,  in the last 168 hours CBC:  Recent Labs Lab 04/05/14 0123 04/05/14 0134  WBC 6.6  --   NEUTROABS 3.9  --   HGB 13.2 16.3*  HCT 42.8 48.0*  MCV 92.6  --   PLT 360  --    PT/INR: @LABRCNTIP (inr:5) Cardiac Enzymes: ) Recent Labs Lab 04/05/14 0123 04/05/14 0729 04/05/14 1312  CKTOTAL 28  --   --   TROPONINI <0.30 <0.30 <0.30   CBG:  Recent Labs Lab 04/05/14 0141  GLUCAP 126*    Iron Studies: No results found for this basename: IRON, TIBC, TRANSFERRIN, FERRITIN,  in the last 168 hours  Xrays/Other Studies: Dg Chest 2 View  04/05/2014   CLINICAL DATA:  Syncope and shortness of breath.  Initial encounter  EXAM: CHEST  2 VIEW  COMPARISON:  03/13/2014  FINDINGS: Chronic moderate cardiomegaly.  Stable upper mediastinal contours.  Low lung volumes. Increased density over the spine in the lateral projection is likely overlapping soft tissues given there is no evidence of consolidation in the frontal projection and the diaphragm remains visible. No pulmonary edema, effusion, or pneumothorax. No acute osseous findings.  IMPRESSION: Hypoaeration.  No edema or pneumonia.   Electronically Signed   By: Jorje Guild M.D.   On: 04/05/2014 02:05   Ct Head Wo Contrast  04/05/2014   CLINICAL DATA:  Found unconscious.  Initial encounter  EXAM: CT HEAD WITHOUT CONTRAST  TECHNIQUE: Contiguous axial images were obtained from the base of the skull through the vertex without intravenous contrast.  COMPARISON:  03/19/2013  FINDINGS: Skull and Sinuses:Negative for fracture or destructive process. The mastoids, middle ears, and imaged paranasal sinuses are clear.  Orbits: No acute abnormality.  Brain: No evidence of acute abnormality, such as acute infarction, hemorrhage, hydrocephalus, or mass lesion/mass effect.  Remote cortical  and subcortical infarcts in the central right MCA territory and the posterior watershed region on the left. Dystrophic mineralization has developed in both infarcts since 2014. Extensive remote small vessel ischemic injury with confluent disease in the deep cerebral white matter and multiple remote bilateral lacunar infarcts in the deep gray nuclei. Generalized cerebral volume loss.  IMPRESSION: 1. No acute intracranial findings. 2. Extensive remote ischemic injury, as above.   Electronically Signed   By: Jorje Guild M.D.   On: 04/05/2014 02:44     Assessment/Plan: 1. CKD: baseline Scr 2.5-3, 2.8 on d/c 10/9 and now 3.88. She is likely dehydrated as evidence by elevated sodium (64meq higher than discharge) and hemoconcentrated with hgb 13.2, baseline 9-10. Discharge weight 248#, now 186# though will ask to have this repeated. Agree with rehydrating gently for 1-2 days and holding diuretics; will need to establish her dry weight as her volume status is going to be most important to monitor going forward. She will need lasix as outpt, but will need closer monitoring (daily weights) and can increase lasix + add metolazone as needed.     Very difficult situation.  Has reduction in vol with poor intake but still total body Na and water xs.   Have to tolerate some of her edema to keep renal perfusion up.  Need to stop Hydralazine at this time.  In 48 h , resume Lasix 160mg  po bid with goal of keeping even (weigh each day).  If gains >2 lb, ^ to tid.  If gains to >5 lb, add back Metolazone.  Do the same in reverse if loses wgt.  Not a good candidate for any other therapy.    Need to make sure the Pyuria is infx not irritation (interstitial), by f/u of culture, and differential on CBC 2. ?UTI, chronic indwelling foley: foley changed on admission. UA large leuks, TNTC on microscopy. follow urine cx, received ceftriaxone x 1 after collection. 4% eosinophils on diff, will keep on differential interstitial drug  reaction though would probably expect an acidosis (bicarb now 31) due to inability of kidneys to produce bicarb in that case. 3. Anemia: acutely elevated to 13.2, baseline appears to be 9-10. received 510mg  feraheme this past month 4. CKD-MBD: will check iPTH 5. CHF: holding diuretics. 6. DM: per primary 7. Afib: on warfarin, per primary 8. Obesity 9. PVD 10. Seizures 11. Anemia 12. [place] 04/05/2014, 2:56 PM  I have seen and examined this patient and agree with the plan of care seen, eval, examined,discussed with resident, patient and family. See changes above .  Aubrynn Katona L 04/05/2014, 4:40 PM

## 2014-04-05 NOTE — H&P (Signed)
  Date: 04/05/2014  Patient name: [name]  Medical record number: [id number]  Date of birth: Oct 17, 1960   I have seen and evaluated [name] and discussed their care with the Residency Team. Briefly, [name] is a 53yo woman with PMH of DM2, HTN, CKD 4, h/o CVA with residual left sided weakness, anemia, sCHF (EF of 35-40%), afib on coumadin, h/o DVT who presented after being found down at the nursing home.  She does not remember the event.  She reported palpitations and chest pain in the Ed, but denied these to me.  She remembers not feeling well for about the last week, but could not specify exactly what felt unwell.  She notes that she has had poor PO intake of both food and liquids in the past week due to not feeling well.  She does have a chronic indwelling foley, unclear when last changed.  UA showed + leukocytes, some bacteria, however it is not clear where the urine was collected from.  She was recently admitted from 03/11/14 - 03/19/14 for acute CHF exacerbation and Cdiff infection.  Labs also show an AKI on CKD with BUN > 100.  Her BUN/Cr ratio is > 20. CXR showed no edema.  CT head without acute finding.   Assessment and Plan: I have seen and evaluated the patient as outlined above. I agree with the formulated Assessment and Plan as detailed in the residents' admission note, with the following changes:   1. Found down - I am unclear that this was a syncopal episode as there was no witness, it could easily be explained by a fall - Agree with trending troponin, EKG and telemetry - F/u on UC, replace foley and repeat UA - Continue Keppra - could consider EEG if persistent findings - CK within normal limits and no Hgburia, so rhabdomyolysis likely not a concern.   2. AKI on CKD and poor PO intake, possible recent emesis and diarrhea  - If diarrhea or emesis, recheck Cdiff - Given ensure or the like for fluid and food intake - 500cc bolus - Recheck BMET this afternoon -  Repeat UA, follow up on UC - Agree with holding Abx for now as patient asymptomatic - If not improving, could consult renal  Other issues per resident note.   Sid Falcon, MD 10/26/20151:54 PM

## 2014-04-05 NOTE — Clinical Social Work Psychosocial (Signed)
Clinical Social Work Department BRIEF PSYCHOSOCIAL ASSESSMENT 04/05/2014  Patient:  [name], [name]     Account Number:  [id number]     Admit date:  04/05/2014  Clinical Social Worker:  Delrae Sawyers  Date/Time:  04/05/2014 03:13 PM  Referred by:  Physician  Date Referred:  04/05/2014 Referred for  SNF Placement   Other Referral:   none.   Interview type:  Family Other interview type:   none.    PSYCHOSOCIAL DATA Living Status:  FACILITY Admitted from facility:  Other Level of care:  [place] Primary support name:  Diamond Nickel Rettke [phone]) Primary support relationship to patient:  [place] Degree of support available:   Strong support system. Pt has other supports:    Katherine Roan (pt's sister) [phone]  Lattie Haw (pt's sister) [phone]  Arbie Cookey (pt's sister) [phone]  Clifton James (pt's nephew, Carol's son) [phone]    CURRENT CONCERNS Current Concerns  Post-Acute Placement   Other Concerns:   none.    SOCIAL WORK ASSESSMENT / PLAN CSW received referral regarding pt admitted from facility. CSW met with pt and pt's sister (Rosaline) at bedside. Pt's sister confirmed pt admitted from [facility]. Pt's sister stated pt's family would prefer for pt to be discharged to a different SNF once medically stable. CSW offered support to pt's sister. Pt's sister understanding of possibility of pt having to return to [facility] if no other bed offers are given to pt. Pt's sister expressed understanding. Pt's sister stated pt's family prefers [facility] with the long-term goal of bringing pt home to live with family.    CSW to continue to follow and assist with discharge planning needs.   Assessment/plan status:  Psychosocial Support/Ongoing Assessment of Needs Other assessment/ plan:   none.   Information/referral to community resources:   [facility] bed offers.    PATIENT'S/FAMILY'S RESPONSE TO PLAN OF CARE: Pt's sister understanding and  agreeable to CSW plan of care. Pt's sister expressed no further questions or concerns at this time.       Lubertha Sayres, [place] [phone]) Licensed Clinical Social Worker Orthopedics [phone]) and Surgical [phone])

## 2014-04-05 NOTE — Clinical Social Work Placement (Addendum)
Clinical Social Work Department CLINICAL SOCIAL WORK PLACEMENT NOTE 04/05/2014  Patient:  [name], [name]  Account Number:  [id number] Admit date:  04/05/2014  Clinical Social Worker:  Delrae Sawyers  Date/time:  04/05/2014 03:19 PM  Clinical Social Work is seeking post-discharge placement for this patient at the following level of care:   [facility]   (*CSW will update this form in Epic as items are completed)   04/05/2014  Patient/family provided with [facility] Department of Clinical Social Work's list of facilities offering this level of care within the geographic area requested by the patient (or if unable, by the patient's family).  04/05/2014  Patient/family informed of their freedom to choose among providers that offer the needed level of care, that participate in Medicare, Medicaid or managed care program needed by the patient, have an available bed and are willing to accept the patient.  04/05/2014  Patient/family informed of [facility]' ownership interest in [facility], as well as of the fact that they are under no obligation to receive care at this facility.  PASARR submitted to EDS on  PASARR number received on   FL2 transmitted to all facilities in geographic area requested by pt/family on  04/05/2014 FL2 transmitted to all facilities within larger geographic area on   Patient informed that his/her managed care company has contracts with or will negotiate with  certain facilities, including the following:     Patient/family informed of bed offers received:  04/06/2014 Patient chooses bed at  [facility] Physician recommends and patient chooses bed at    Patient to be transferred to  [facility] on  04/09/2014 Patient to be transferred to facility by [facility] Patient and family notified of transfer on 04/09/2014 Name of family member notified:  Pt's sister, Mackie Pai, notified.  The following physician request were entered in  Epic:   Additional Comments: PASARR previously existing.  Lubertha Sayres, [place] ([id number]) Licensed Clinical Social Worker Orthopedics [phone]) and Surgical [phone])

## 2014-04-05 NOTE — H&P (Signed)
Date: 04/05/2014               Patient Name:  [name] MRN: [id number]  DOB: [birth year] Age / Sex: 53 y.o., female   PCP: Rogene Houston, MD         Medical Service: Internal Medicine Teaching Service         Attending Physician: Dr. Sid Falcon, MD    First Contact: Dr. Posey Pronto Pager: [phone]  Second Contact: Dr. Heber Avalon Pager: [phone]       After Hours (After 5p/  First Contact Pager: [phone]  weekends / holidays): Second Contact Pager: [phone]   Chief Complaint: Syncope  History of Present Illness: [name] is a 52 year old woman with history of DM2, HTN, HLD, CKD4, CVA, Anemia, Systolic CHF (Last echo Q000111Q with LV EF 35-40%), atrial fibrillation on coumadin, history of DVT, OSA presenting after being found unconscious at her nursing facility. Upon arrival of EMS, she was awake and oriented. She does not remember the event. The last thing she remembers prior to arrival is lying in bed trying to fall asleep. She feels confused right now. She reports palpitations, chest pain in her R lower chest, unchanged shortness of breath at baseline. Denies fevers/chills, lightheadedness/dizziness, abdominal pain, dysuria, paresthesias. She reports a headache that is new but cannot describe more (onset etc). She reports vomiting earlier this week but no nausea presently. She says she has diarrhea and is unable to report the last time she had it. She also reports some pain in her bilateral stumps.  She has a chronic indwelling foley.  She was recently admitted from 03/11/2014 to 03/19/2014 with acute on chronic systolic heart failure and C Diff infection. She was on oral vancomycin 125mg  QID to finish 10/16.  Meds: No current facility-administered medications for this encounter.   Current Outpatient Prescriptions  Medication Sig Dispense Refill  . acetaminophen (TYLENOL) 325 MG tablet Take 2 tablets (650 mg total) by mouth 3 (three) times daily.  90 tablet  3  . allopurinol (ZYLOPRIM)  100 MG tablet Take 100 mg by mouth daily.      Marland Kitchen ALPRAZolam (XANAX) 0.25 MG tablet Take 0.25 mg by mouth every 8 (eight) hours as needed for anxiety.      . Amino Acids-Protein Hydrolys (FEEDING SUPPLEMENT, PRO-STAT SUGAR FREE 64,) LIQD Take 30 mLs by mouth daily.      Marland Kitchen atorvastatin (LIPITOR) 20 MG tablet Take 20 mg by mouth at bedtime.      . carvedilol (COREG) 3.125 MG tablet Take 3.125 mg by mouth 2 (two) times daily with a meal.      . DULoxetine (CYMBALTA) 20 MG capsule Take 1 capsule (20 mg total) by mouth daily.  30 capsule  3  . ferrous sulfate 325 (65 FE) MG tablet Take 325 mg by mouth 2 (two) times daily.       . furosemide (LASIX) 80 MG tablet Take 2 tablets (160 mg total) by mouth 3 (three) times daily.  180 tablet  0  . hydrALAZINE (APRESOLINE) 10 MG tablet Take 10 mg by mouth 3 (three) times daily.      Marland Kitchen HYDROmorphone (DILAUDID) 2 MG tablet Take 1 tablet (2 mg total) by mouth every 4 (four) hours as needed for moderate pain or severe pain.  30 tablet  0  . isosorbide dinitrate (ISORDIL) 20 MG tablet Take 20 mg by mouth 3 (three) times daily.      Marland Kitchen levETIRAcetam (KEPPRA) 500 MG tablet Take  500 mg by mouth 2 (two) times daily.      . Menthol, Topical Analgesic, (ICY HOT) 5 % PADS Apply 1 patch topically daily. Apply to each stump      . metolazone (ZAROXOLYN) 5 MG tablet Take 1 tablet (5 mg total) by mouth 2 (two) times daily.  60 tablet  0  . MICONAZOLE NITRATE EX Apply 1 application topically 2 (two) times daily.      . Multiple Vitamin (MULTIVITAMIN WITH MINERALS) TABS tablet Take 1 tablet by mouth daily.      . pregabalin (LYRICA) 25 MG capsule Take 1 capsule (25 mg total) by mouth at bedtime.  30 capsule  0  . tiZANidine (ZANAFLEX) 4 MG tablet Take 4 mg by mouth every 8 (eight) hours as needed for muscle spasms.      Marland Kitchen warfarin (COUMADIN) 3 MG tablet Take 1.5 mg by mouth at bedtime.      . Alum & Mag Hydroxide-Simeth (GI COCKTAIL) SUSP suspension Take 30 mLs by mouth 2 (two)  times daily as needed for indigestion (Chest Pain). Shake well.  1800 mL  3  . senna-docusate (SENOKOT-S) 8.6-50 MG per tablet Take 1 tablet by mouth at bedtime as needed for mild constipation.  30 tablet  0  . [DISCONTINUED] potassium chloride SA (K-DUR,KLOR-CON) 20 MEQ tablet Take 20 mEq by mouth daily.      . [DISCONTINUED] promethazine (PHENERGAN) 25 MG/ML injection Inject 25 mg into the muscle every 6 (six) hours as needed. For nausea/vomiting        Allergies: Allergies as of 04/05/2014 - Review Complete 04/05/2014  Allergen Reaction Noted  . Ace inhibitors Shortness Of Breath 04/26/2011  . Latex Hives 10/19/2011  . Lisinopril Other (See Comments) 09/13/2011  . Vancomycin Other (See Comments) 10/10/2010   Past Medical History  Diagnosis Date  . HTN (hypertension)   . HLD (hyperlipidemia)   . History of methicillin resistant staphylococcus aureus (MRSA)   . CVA (cerebral infarction)   . Anemia   . Tachycardia   . Obesity   . Lower back pain     Chronic  . Idiopathic peripheral neuropathy     NOS  . DJD (degenerative joint disease)   . Headache(784.0)   . GERD (gastroesophageal reflux disease)   . Depression   . Anxiety   . Hypokalemia     resolved  . Cerebral infarct     left parietal and bilateral  . Esophagitis     distal  . Fluid overload     compensated  . Urinary retention   . H/O: pneumonia   . History of bacteremia   . Surgery, other elective     of the ear  . Stroke     Hx of left frontoparietal stroke in 12/11. History of right brain stroke.  . Cardiomyopathy     Echocardiogram 04/23/11: Mild LVH, EF 30-35%, mild MR, mild LAE, mild-moderate RVE.  TEE 04/25/11: Moderate LVH, severe biventricular dysfunction, EF 20-25%, moderate to severe MR, moderate TR, biatrial enlargement, no PFO, negative bubble study  . Shortness of breath     with exertion   . Sleep apnea     no machine used  pt does not remember test for sleep apnea  . DM type 2 (diabetes  mellitus, type 2)     type two   oral meds only   . 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 UTI (lower urinary tract  infection) 10/19/11    had approx April 2013  . Osteomyelitis   . DJD (degenerative joint disease)   . Urinary retention   . Pneumonia   . Peripheral vascular disease    Past Surgical History  Procedure Laterality Date  . Tympanoplasty    . Toe amputation      left 2nd toe  . Dilation and curettage of uterus      history  . Tee without cardioversion  04/25/2011    Procedure: TRANSESOPHAGEAL ECHOCARDIOGRAM (TEE);  Surgeon: Jolaine Artist, MD;  Location[facility] ENDOSCOPY;  Service: Cardiovascular;  Laterality: N/A;  . Esophagogastroduodenoscopy  08/14/2011    Procedure: ESOPHAGOGASTRODUODENOSCOPY (EGD);  Surgeon: Scarlette Shorts, MD;  Location[facility]OSCOPY;  Service: Endoscopy;  Laterality: N/A;  . Amputation  01/18/2012    Procedure: AMPUTATION BELOW KNEE;  Surgeon: Newt Minion, MD;  Location: [place];  Service: Orthopedics;  Laterality: Left;  Left Below Knee Amputation  . Amputation Right 08/01/2012    Procedure: Right Below Knee Amputation;  Surgeon: Newt Minion, MD;  Location: [place];  Service: Orthopedics;  Laterality: Right;  Right Below Knee Amputation  . Pars plana vitrectomy Left 01/28/2013    Procedure: PARS PLANA VITRECTOMY WITH 23 GAUGE;  Surgeon: Adonis Brook, MD;  Location: [place];  Service: Ophthalmology;  Laterality: Left;  . Photocoagulation with laser Left 01/28/2013    Procedure: PHOTOCOAGULATION WITH LASER;  Surgeon: Adonis Brook, MD;  Location: [place];  Service: Ophthalmology;  Laterality: Left;  ENDOLASER  . Membrane peel Left 01/28/2013    Procedure: MEMBRANE PEEL;  Surgeon: Adonis Brook, MD;  Location: [place];  Service: Ophthalmology;  Laterality: Left;  . Amputation Left 03/18/2013    Procedure: AMPUTATION ABOVE KNEE;  Surgeon: Newt Minion, MD;  Location: [place];  Service: Orthopedics;  Laterality: Left;   Family History  Problem Relation Age of  Onset  . Coronary artery disease    . Diabetes    . Cancer Mother   . Diabetes Mother   . Heart disease Father   . Hyperlipidemia Father   . Hypertension Father   . Stroke Father   . Heart disease Brother   . Anesthesia problems Neg Hx   . Hypotension Neg Hx   . Malignant hyperthermia Neg Hx   . Pseudochol deficiency Neg Hx    History   Social History  . Marital Status: Divorced    Spouse Name: N/A    Number of Children: N/A  . Years of Education: N/A   Occupational History  . Not on file.   Social History Main Topics  . Smoking status: Never Smoker   . Smokeless tobacco: Never Used  . Alcohol Use: Yes     Comment: rare on holidays  . Drug Use: No  . Sexual Activity: Not Currently   Other Topics Concern  . Not on file   Social History Narrative   Divorced   One of 10 children   5 in [place] and was living with sister at time of CVA   Nonsmoker   Nondrinker    Review of Systems: Constitutional: no fevers/chills Eyes: +blurry vision Ears, nose, mouth, throat, and face: no cough Respiratory: +shortness of breath Cardiovascular: +chest pain Gastrointestinal: no nausea, +vomiting, no abdominal pain, no constipation, +diarrhea Genitourinary: no dysuria Integument: no rash Hematologic/lymphatic: no bleeding/bruising, no edema Musculoskeletal: +LE stump pain Neurological: no paresthesias   Physical Exam: Blood pressure 144/84, pulse 81, temperature 98 F (36.7 C), temperature source  Oral, resp. rate 14, SpO2 100.00%. General Apperance: NAD, somnolent Head: Normocephalic, atraumatic Eyes: PERRL, anicteric sclera Ears: Normal external ear canal Nose: Nares normal, septum midline, mucosa normal Throat: Lips, mucosa and tongue normal  Neck: Supple, trachea midline Lungs: Clear to auscultation bilaterally. No wheezes, rhonchi or rales.  Chest Wall: Nontender, no deformity Heart: Regular rate and rhythm, no murmur/rub/gallop Abdomen: Soft, nontender, nondistended,  no rebound/guarding GU: foley in place Extremities: R BKA, L AKA well healed Pulses: 2+ throughout Skin: No rashes or lesions Neurologic: Somnolent but arousable to voice and oriented x 2.    Lab results: Basic Metabolic Panel:  Recent Labs  04/05/14 0123 04/05/14 0134  NA 147 145  K 4.0 3.9  CL 97 100  CO2 32  --   GLUCOSE 123* 128*  BUN 106* 97*  CREATININE 3.90* 3.60*  CALCIUM 9.7  --    Liver Function Tests:  Recent Labs  04/05/14 0123  AST 51*  ALT 26  ALKPHOS 117  BILITOT 0.6  PROT 8.9*  ALBUMIN 3.3*   CBC:  Recent Labs  04/05/14 0123 04/05/14 0134  WBC 6.6  --   NEUTROABS 3.9  --   HGB 13.2 16.3*  HCT 42.8 48.0*  MCV 92.6  --   PLT 360  --    Cardiac Enzymes:  Recent Labs  04/05/14 0123  CKTOTAL 28  TROPONINI <0.30   CBG:  Recent Labs  04/05/14 0141  GLUCAP 126*   Coagulation:  Recent Labs  04/05/14 0123  LABPROT 24.6*  INR 2.19*    Urinalysis:  Recent Labs  04/05/14 0316  COLORURINE YELLOW  LABSPEC 1.015  PHURINE 6.5  GLUCOSEU NEGATIVE  HGBUR SMALL*  BILIRUBINUR NEGATIVE  KETONESUR NEGATIVE  PROTEINUR >300*  UROBILINOGEN 0.2  NITRITE NEGATIVE  LEUKOCYTESUR LARGE*   Misc. Labs: Lactic acid 1.69  Imaging results:  Dg Chest 2 View  04/05/2014   CLINICAL DATA:  Syncope and shortness of breath.  Initial encounter  EXAM: CHEST  2 VIEW  COMPARISON:  03/13/2014  FINDINGS: Chronic moderate cardiomegaly.  Stable upper mediastinal contours.  Low lung volumes. Increased density over the spine in the lateral projection is likely overlapping soft tissues given there is no evidence of consolidation in the frontal projection and the diaphragm remains visible. No pulmonary edema, effusion, or pneumothorax. No acute osseous findings.  IMPRESSION: Hypoaeration.  No edema or pneumonia.   Electronically Signed   By: Jorje Guild M.D.   On: 04/05/2014 02:05   Ct Head Wo Contrast  04/05/2014   CLINICAL DATA:  Found unconscious.   Initial encounter  EXAM: CT HEAD WITHOUT CONTRAST  TECHNIQUE: Contiguous axial images were obtained from the base of the skull through the vertex without intravenous contrast.  COMPARISON:  03/19/2013  FINDINGS: Skull and Sinuses:Negative for fracture or destructive process. The mastoids, middle ears, and imaged paranasal sinuses are clear.  Orbits: No acute abnormality.  Brain: No evidence of acute abnormality, such as acute infarction, hemorrhage, hydrocephalus, or mass lesion/mass effect.  Remote cortical and subcortical infarcts in the central right MCA territory and the posterior watershed region on the left. Dystrophic mineralization has developed in both infarcts since 2014. Extensive remote small vessel ischemic injury with confluent disease in the deep cerebral white matter and multiple remote bilateral lacunar infarcts in the deep gray nuclei. Generalized cerebral volume loss.  IMPRESSION: 1. No acute intracranial findings. 2. Extensive remote ischemic injury, as above.   Electronically Signed   By: Jorje Guild  M.D.   On: 04/05/2014 02:44    Other results: EKG: pending.  Assessment & Plan by Problem: Principal Problem:   Syncope Active Problems:   Type 2 diabetes mellitus with complication   HLD (hyperlipidemia)   GERD   Essential hypertension   DVT (deep venous thrombosis)   Chronic kidney disease, stage 3   CHF (congestive heart failure)  Syncope: Differential includes vasovagal, orthostatic hypotension, infection, ACS, arrhythmia, TIA, seizure, hypoglycemia, hypoxia, PE. Unclear events around syncope making it difficult to evaluate for vasovagal causes. Pt is bedbound making orthostatic hypotension unlikely. She is afebrile with no leukocytosis. Does not meet SIRS criteria. CXR without pneumonia. UA positive for large leukocytes, neg nitrites, numerous WBC and few bacteria in setting of chronic indwelling foley. EKG is pending but initial troponin negative - ACS less likely. She  has history of atrial fibrillation but monitoring in room showed sinus rhythm. CT head with no acute intracranial findings. No report of hypoglycemia by EMS and BG wnl here. She is at moderate risk for PE but is therapeutic on coumadin. She received 1g ceftraixone in the ED. -Repeat EKG in AM -Trend Troponins -Telemetry monitoring -f/u urine culture -Orthostatic vital signs -PT/OT -continue coumadin -continue keppra 500mg  BID -consider EEG  Emesis, diarrhea: Completed 14 day course of PO vanc. Unclear if diarrhea has continued or worsened. -consider rechecking C diff  CKD4: Admission Cr 3.6, increased from 2.8 03/18/2014. Progression of CKD versus acute on CKD. Previously recommended to not have HD by renal. -Consider consult renal -hold home allopurinol  Systolic CHF: Last echo Q000111Q with LV EF 35-40%. Does not appear to be in acute exacerbation. CXR without edema.  -daily weight -in/out's -continue home lasix 160mg  TID -continue metolazone 5mg  BID  DM2 -CBG monitoring -Lyrica 25mg  QHS held  HTN -continue carvedilol 3.125mg  BID -continue hydralazine 10mg  TID -continue isosorbide dinitrate 20mg  TID  HLD -continue atorvastatin 20mg  daily  Chronic anemia -continue home ferrous sulfate 325mg  BID  Depression/anxiety:  -continue home cymbalta 20mg  daily -home xanax 0.25mg  Q8hr prn anxiety held  FEN -NPO -ionized calcium 1.06, will defer to primary team  DVT ppx: coumadin  Dispo: Disposition is deferred at this time, awaiting improvement of current medical problems. Anticipated discharge in approximately 1-2 day(s).   The patient does have a current PCP Rogene Houston, MD) and does not need an [facility] hospital follow-up appointment after discharge.  The patient does not have transportation limitations that hinder transportation to clinic appointments.  Signed: Jacques Earthly, MD 04/05/2014, 5:00 AM

## 2014-04-05 NOTE — Progress Notes (Signed)
Paged dr patel states to hold imdur but could give HCTZ and coreg as scheduled at this time after review of recent vital signs.

## 2014-04-05 NOTE — ED Notes (Signed)
Attempted report 

## 2014-04-05 NOTE — ED Provider Notes (Signed)
CSN: [id number]     Arrival date & time 04/05/14  0031 History   First MD Initiated Contact with Patient 04/05/14 0054     Chief Complaint  Patient presents with  . Loss of Consciousness     (Consider location/radiation/quality/duration/timing/severity/associated sxs/prior Treatment) HPI [name] is a 53 y.o. female with possible history of hypertension, hyperlipidemia, CVA, CHF with EF of 20%, and diabetes status post bilateral lower extremity amputations coming in with syncopal episode. Per EMS report she was reported unconscious on the floor. Upon EMS arrival she is and O 4. Patient tells me she does not remember this event and does not remember passing out. She admits to pain in her bilateral stumps. She denies any fevers or recent infections. Patient does have a chronic Foley in place. She has no further complaints.  10 Systems reviewed and are negative for acute change except as noted in the HPI.     Past Medical History  Diagnosis Date  . HTN (hypertension)   . HLD (hyperlipidemia)   . History of methicillin resistant staphylococcus aureus (MRSA)   . CVA (cerebral infarction)   . Anemia   . Tachycardia   . Obesity   . Lower back pain     Chronic  . Idiopathic peripheral neuropathy     NOS  . DJD (degenerative joint disease)   . Headache(784.0)   . GERD (gastroesophageal reflux disease)   . Depression   . Anxiety   . Hypokalemia     resolved  . Cerebral infarct     left parietal and bilateral  . Esophagitis     distal  . Fluid overload     compensated  . Urinary retention   . H/O: pneumonia   . History of bacteremia   . Surgery, other elective     of the ear  . Stroke     Hx of left frontoparietal stroke in 12/11. History of right brain stroke.  . Cardiomyopathy     Echocardiogram 04/23/11: Mild LVH, EF 30-35%, mild MR, mild LAE, mild-moderate RVE.  TEE 04/25/11: Moderate LVH, severe biventricular dysfunction, EF 20-25%, moderate to severe MR, moderate  TR, biatrial enlargement, no PFO, negative bubble study  . Shortness of breath     with exertion   . Sleep apnea     no machine used  pt does not remember test for sleep apnea  . DM type 2 (diabetes mellitus, type 2)     type two   oral meds only   . 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 UTI (lower urinary tract infection) 10/19/11    had approx April 2013  . Osteomyelitis   . DJD (degenerative joint disease)   . Urinary retention   . Pneumonia   . Peripheral vascular disease    Past Surgical History  Procedure Laterality Date  . Tympanoplasty    . Toe amputation      left 2nd toe  . Dilation and curettage of uterus      history  . Tee without cardioversion  04/25/2011    Procedure: TRANSESOPHAGEAL ECHOCARDIOGRAM (TEE);  Surgeon: Jolaine Artist, MD;  Location: [facility] ENDOSCOPY;  Service: Cardiovascular;  Laterality: N/A;  . Esophagogastroduodenoscopy  08/14/2011    Procedure: ESOPHAGOGASTRODUODENOSCOPY (EGD);  Surgeon: Scarlette Shorts, MD;  Location: [facility] ENDOSCOPY;  Service: Endoscopy;  Laterality: N/A;  . Amputation  01/18/2012    Procedure: AMPUTATION BELOW KNEE;  Surgeon: Newt Minion,  MD;  Location: [place];  Service: Orthopedics;  Laterality: Left;  Left Below Knee Amputation  . Amputation Right 08/01/2012    Procedure: Right Below Knee Amputation;  Surgeon: Newt Minion, MD;  Location: [place];  Service: Orthopedics;  Laterality: Right;  Right Below Knee Amputation  . Pars plana vitrectomy Left 01/28/2013    Procedure: PARS PLANA VITRECTOMY WITH 23 GAUGE;  Surgeon: Adonis Brook, MD;  Location: [place];  Service: Ophthalmology;  Laterality: Left;  . Photocoagulation with laser Left 01/28/2013    Procedure: PHOTOCOAGULATION WITH LASER;  Surgeon: Adonis Brook, MD;  Location: [place];  Service: Ophthalmology;  Laterality: Left;  ENDOLASER  . Membrane peel Left 01/28/2013    Procedure: MEMBRANE PEEL;  Surgeon: Adonis Brook, MD;  Location: [place];  Service: Ophthalmology;  Laterality: Left;   . Amputation Left 03/18/2013    Procedure: AMPUTATION ABOVE KNEE;  Surgeon: Newt Minion, MD;  Location: [place];  Service: Orthopedics;  Laterality: Left;   Family History  Problem Relation Age of Onset  . Coronary artery disease    . Diabetes    . Cancer Mother   . Diabetes Mother   . Heart disease Father   . Hyperlipidemia Father   . Hypertension Father   . Stroke Father   . Heart disease Brother   . Anesthesia problems Neg Hx   . Hypotension Neg Hx   . Malignant hyperthermia Neg Hx   . Pseudochol deficiency Neg Hx    History  Substance Use Topics  . Smoking status: Never Smoker   . Smokeless tobacco: Never Used  . Alcohol Use: Yes     Comment: rare on holidays   OB History   Grav Para Term Preterm Abortions TAB SAB Ect Mult Living                 Review of Systems    Allergies  Ace inhibitors; Latex; Lisinopril; and Vancomycin  Home Medications   Prior to Admission medications   Medication Sig Start Date End Date Taking? Authorizing Provider  acetaminophen (TYLENOL) 325 MG tablet Take 2 tablets (650 mg total) by mouth 3 (three) times daily. 03/19/14   Drucilla Schmidt, MD  allopurinol (ZYLOPRIM) 100 MG tablet Take 100 mg by mouth daily.    Historical Provider, MD  Alum & Mag Hydroxide-Simeth (GI COCKTAIL) SUSP suspension Take 30 mLs by mouth 2 (two) times daily as needed for indigestion (Chest Pain). Shake well. 03/19/14   Drucilla Schmidt, MD  Amino Acids-Protein Hydrolys (FEEDING SUPPLEMENT, PRO-STAT SUGAR FREE 64,) LIQD Take 30 mLs by mouth daily.    Historical Provider, MD  atorvastatin (LIPITOR) 20 MG tablet Take 20 mg by mouth at bedtime.    Historical Provider, MD  carvedilol (COREG) 3.125 MG tablet Take 3.125 mg by mouth 2 (two) times daily with a meal.    Historical Provider, MD  diazepam (VALIUM) 2 MG tablet Take 1 tablet (2 mg total) by mouth every 6 (six) hours as needed for anxiety or muscle spasms (sleep). 03/19/14   Drucilla Schmidt, MD  DULoxetine (CYMBALTA)  20 MG capsule Take 1 capsule (20 mg total) by mouth daily. 03/19/14   Drucilla Schmidt, MD  famotidine (PEPCID) 10 MG tablet Take 1 tablet (10 mg total) by mouth 2 (two) times daily. 03/19/14   Francesca Oman, DO  ferrous sulfate 325 (65 FE) MG tablet Take 325 mg by mouth 2 (two) times daily.     Historical Provider, MD  furosemide (LASIX)  80 MG tablet Take 2 tablets (160 mg total) by mouth 3 (three) times daily. 03/19/14   Drucilla Schmidt, MD  hydrALAZINE (APRESOLINE) 10 MG tablet Take 10 mg by mouth 3 (three) times daily.    Historical Provider, MD  HYDROmorphone (DILAUDID) 2 MG tablet Take 1 tablet (2 mg total) by mouth every 4 (four) hours as needed for moderate pain or severe pain. 03/19/14   Drucilla Schmidt, MD  isosorbide dinitrate (ISORDIL) 20 MG tablet Take 20 mg by mouth 3 (three) times daily.    Historical Provider, MD  levETIRAcetam (KEPPRA) 500 MG tablet Take 500 mg by mouth 2 (two) times daily.    Historical Provider, MD  lidocaine (LIDODERM) 5 % Place 2 patches onto the skin daily. Remove & Discard patch within 12 hours or as directed by MD 03/19/14   Drucilla Schmidt, MD  metolazone (ZAROXOLYN) 5 MG tablet Take 1 tablet (5 mg total) by mouth 2 (two) times daily. 03/19/14   Drucilla Schmidt, MD  MICONAZOLE NITRATE EX Apply 1 application topically 2 (two) times daily.    Historical Provider, MD  pregabalin (LYRICA) 25 MG capsule Take 1 capsule (25 mg total) by mouth at bedtime. 03/19/14   Drucilla Schmidt, MD  senna-docusate (SENOKOT-S) 8.6-50 MG per tablet Take 1 tablet by mouth at bedtime as needed for mild constipation. 03/19/14   Drucilla Schmidt, MD  tiZANidine (ZANAFLEX) 4 MG tablet Take 4 mg by mouth every 8 (eight) hours as needed for muscle spasms.    Historical Provider, MD  vancomycin (VANCOCIN) 50 mg/mL oral solution Take 2.5 mLs (125 mg total) by mouth 4 (four) times daily. 03/19/14   Francesca Oman, DO  warfarin (COUMADIN) 6 MG tablet Take 1 tablet (6 mg total) by mouth daily. 03/21/14   Francesca Oman,  DO   BP 101/45  Pulse 90  Temp(Src) 98 F (36.7 C) (Oral)  Resp 21  SpO2 100% Physical Exam  Nursing note and vitals reviewed. Constitutional: She is oriented to person, place, and time. She appears well-developed and well-nourished. No distress.  HENT:  Head: Normocephalic and atraumatic.  Nose: Nose normal.  Mouth/Throat: Oropharynx is clear and moist. No oropharyngeal exudate.  Eyes: Conjunctivae and EOM are normal. Pupils are equal, round, and reactive to light. No scleral icterus.  Neck: Normal range of motion. Neck supple. No JVD present. No tracheal deviation present. No thyromegaly present.  Cardiovascular: Normal rate, regular rhythm and normal heart sounds.  Exam reveals no gallop and no friction rub.   No murmur heard. Pulmonary/Chest: Effort normal and breath sounds normal. No respiratory distress. She has no wheezes. She exhibits no tenderness.  Abdominal: Soft. Bowel sounds are normal. She exhibits no distension and no mass. There is no tenderness. There is no rebound and no guarding.  Musculoskeletal: Normal range of motion. She exhibits no edema and no tenderness.  Bilateral lower extremity amputations  Lymphadenopathy:    She has no cervical adenopathy.  Neurological: She is alert and oriented to person, place, and time.  Skin: Skin is warm and dry. No rash noted. She is not diaphoretic. No erythema. No pallor.    ED Course  Procedures (including critical care time) Labs Review Labs Reviewed  URINALYSIS, ROUTINE W REFLEX MICROSCOPIC - Abnormal; Notable for the following:    APPearance CLOUDY (*)    Hgb urine dipstick SMALL (*)    Protein, ur >300 (*)    Leukocytes, UA LARGE (*)    All other components within normal  limits  CBC WITH DIFFERENTIAL - Abnormal; Notable for the following:    RDW 19.0 (*)    All other components within normal limits  COMPREHENSIVE METABOLIC PANEL - Abnormal; Notable for the following:    Glucose, Bld 123 (*)    BUN 106 (*)     Creatinine, Ser 3.90 (*)    Total Protein 8.9 (*)    Albumin 3.3 (*)    AST 51 (*)    GFR calc non Af Amer 12 (*)    GFR calc Af Amer 14 (*)    Anion gap 18 (*)    All other components within normal limits  PROTIME-INR - Abnormal; Notable for the following:    Prothrombin Time 24.6 (*)    INR 2.19 (*)    All other components within normal limits  URINE MICROSCOPIC-ADD ON - Abnormal; Notable for the following:    Bacteria, UA FEW (*)    All other components within normal limits  I-STAT CHEM 8, ED - Abnormal; Notable for the following:    BUN 97 (*)    Creatinine, Ser 3.60 (*)    Glucose, Bld 128 (*)    Calcium, Ion 1.06 (*)    Hemoglobin 16.3 (*)    HCT 48.0 (*)    All other components within normal limits  CBG MONITORING, ED - Abnormal; Notable for the following:    Glucose-Capillary 126 (*)    All other components within normal limits  URINE CULTURE  CULTURE, BLOOD (ROUTINE X 2)  CULTURE, BLOOD (ROUTINE X 2)  CK  TROPONIN I  POCT CBG (FASTING - GLUCOSE)-MANUAL ENTRY  I-STAT CG4 LACTIC ACID, ED  I-STAT CG4 LACTIC ACID, ED  Randolm Idol, ED    Imaging Review Dg Chest 2 View  04/05/2014   CLINICAL DATA:  Syncope and shortness of breath.  Initial encounter  EXAM: CHEST  2 VIEW  COMPARISON:  03/13/2014  FINDINGS: Chronic moderate cardiomegaly.  Stable upper mediastinal contours.  Low lung volumes. Increased density over the spine in the lateral projection is likely overlapping soft tissues given there is no evidence of consolidation in the frontal projection and the diaphragm remains visible. No pulmonary edema, effusion, or pneumothorax. No acute osseous findings.  IMPRESSION: Hypoaeration.  No edema or pneumonia.   Electronically Signed   By: Jorje Guild M.D.   On: 04/05/2014 02:05   Ct Head Wo Contrast  04/05/2014   CLINICAL DATA:  Found unconscious.  Initial encounter  EXAM: CT HEAD WITHOUT CONTRAST  TECHNIQUE: Contiguous axial images were obtained from the base of  the skull through the vertex without intravenous contrast.  COMPARISON:  03/19/2013  FINDINGS: Skull and Sinuses:Negative for fracture or destructive process. The mastoids, middle ears, and imaged paranasal sinuses are clear.  Orbits: No acute abnormality.  Brain: No evidence of acute abnormality, such as acute infarction, hemorrhage, hydrocephalus, or mass lesion/mass effect.  Remote cortical and subcortical infarcts in the central right MCA territory and the posterior watershed region on the left. Dystrophic mineralization has developed in both infarcts since 2014. Extensive remote small vessel ischemic injury with confluent disease in the deep cerebral white matter and multiple remote bilateral lacunar infarcts in the deep gray nuclei. Generalized cerebral volume loss.  IMPRESSION: 1. No acute intracranial findings. 2. Extensive remote ischemic injury, as above.   Electronically Signed   By: Jorje Guild M.D.   On: 04/05/2014 02:44     EKG Interpretation None      MDM  Final diagnoses:  Syncope  Seizure    Patient presents to emergency department after being found down. Workup reveals a urinary tract infection. Patient was given ceftriaxone in the emergency department and will be admitted to the internal medicine resident service as a bounce back patient on the [place] unit.  CT head does not reveal any intracranial abnormality. No pneumonia on chest x-ray. Creatinine is elevated at 3, potassium is normal. Patient recently was DC'd from dialysis treatment.    Everlene Balls, MD 04/05/14 [id number]

## 2014-04-05 NOTE — ED Notes (Signed)
Pt. From a nursing facility in [place], nursing facility called EMS because pt. Was unconscious. Upon EMS arrival pt. Was A&Ox4.

## 2014-04-05 NOTE — ED Notes (Addendum)
Patient transported to CT 

## 2014-04-05 NOTE — Progress Notes (Addendum)
Subjective: This AM, she was much more awake and alert. She reports not feeling eating or drinking much.  Per her sister [name] who is HCPOA, she seems to do. She was on fluid restriction at the SNF and has been lethargic for the last week or so. She understands that her sister is in a delicate place with regards to her renal function and that it's always a challenge to keep her fluid balance stable. Palliative care was involved with her care at her last hospitalization and had agreed to set up care for her at the SNF, but she found out at last Thursday that they would not be offering care.  Per the nursing home RN, she and her sister requested hospitalization. She has been at the facility for the last 14 days but was not eating tolerating PO intake for the last 2-3 days. She was conscious enough to request transfer to [facility]. Of note, [facility] did not think she qualified for their services.  Objective: Vital signs in last 24 hours: Filed Vitals:   04/05/14 0745 04/05/14 0800 04/05/14 0849 04/05/14 0914  BP: 156/107 151/136 153/75 149/64  Pulse: 77 83  86  Temp:    97.6 F (36.4 C)  TempSrc:    Oral  Resp: 10 14  19   Weight:    186 lb 4.8 oz (84.505 kg)  SpO2: 100% 100%  99%   Weight change:   Intake/Output Summary (Last 24 hours) at 04/05/14 1138 Last data filed at 04/05/14 1100  Gross per 24 hour  Intake    290 ml  Output      0 ml  Net    290 ml   Filed Vitals:   04/05/14 0914  BP: 149/64  Pulse: 86  Temp: 97.6 F (36.4 C)  Resp: 19   General: resting in bed, NAD HEENT: PERRL, EOMI, no scleral icterus Cardiac: RRR, no rubs, murmurs or gallops Pulm: clear to auscultation bilaterally, no wheezes, rales, or rhonchi in the anterior fields Abd: soft, nontender, nondistended, BS present Ext: L AKA, R BKA, LUE appears flaccid Neuro: alert and oriented X3, cranial nerves II-XII grossly intact, L sided deficits 2/2 prior CVA  Lab Results: Basic  Metabolic Panel:  Recent Labs Lab 04/05/14 0123 04/05/14 0134 04/05/14 0729  NA 147 145 147  K 4.0 3.9 4.1  CL 97 100 97  CO2 32  --  31  GLUCOSE 123* 128* 123*  BUN 106* 97* 109*  CREATININE 3.90* 3.60* 3.88*  CALCIUM 9.7  --  9.8   Cardiac Enzymes:  Recent Labs Lab 04/05/14 0123 04/05/14 0729  CKTOTAL 28  --   TROPONINI <0.30 <0.30   CBG:  Recent Labs Lab 04/05/14 0141  GLUCAP 126*   Coagulation:  Recent Labs Lab 04/05/14 0123  LABPROT 24.6*  INR 2.19*   Urinalysis:  Recent Labs Lab 04/05/14 0316  COLORURINE YELLOW  LABSPEC 1.015  PHURINE 6.5  GLUCOSEU NEGATIVE  HGBUR SMALL*  BILIRUBINUR NEGATIVE  KETONESUR NEGATIVE  PROTEINUR >300*  UROBILINOGEN 0.2  NITRITE NEGATIVE  LEUKOCYTESUR LARGE*   Studies/Results: Dg Chest 2 View  04/05/2014   CLINICAL DATA:  Syncope and shortness of breath.  Initial encounter  EXAM: CHEST  2 VIEW  COMPARISON:  03/13/2014  FINDINGS: Chronic moderate cardiomegaly.  Stable upper mediastinal contours.  Low lung volumes. Increased density over the spine in the lateral projection is likely overlapping soft tissues given there is no evidence of consolidation in the frontal  projection and the diaphragm remains visible. No pulmonary edema, effusion, or pneumothorax. No acute osseous findings.  IMPRESSION: Hypoaeration.  No edema or pneumonia.   Electronically Signed   By: Jorje Guild M.D.   On: 04/05/2014 02:05   Ct Head Wo Contrast  04/05/2014   CLINICAL DATA:  Found unconscious.  Initial encounter  EXAM: CT HEAD WITHOUT CONTRAST  TECHNIQUE: Contiguous axial images were obtained from the base of the skull through the vertex without intravenous contrast.  COMPARISON:  03/19/2013  FINDINGS: Skull and Sinuses:Negative for fracture or destructive process. The mastoids, middle ears, and imaged paranasal sinuses are clear.  Orbits: No acute abnormality.  Brain: No evidence of acute abnormality, such as acute infarction, hemorrhage,  hydrocephalus, or mass lesion/mass effect.  Remote cortical and subcortical infarcts in the central right MCA territory and the posterior watershed region on the left. Dystrophic mineralization has developed in both infarcts since 2014. Extensive remote small vessel ischemic injury with confluent disease in the deep cerebral white matter and multiple remote bilateral lacunar infarcts in the deep gray nuclei. Generalized cerebral volume loss.  IMPRESSION: 1. No acute intracranial findings. 2. Extensive remote ischemic injury, as above.   Electronically Signed   By: Jorje Guild M.D.   On: 04/05/2014 02:44   Medications: I have reviewed the patient's current medications. Scheduled Meds: . atorvastatin  20 mg Oral QHS  . carvedilol  3.125 mg Oral BID WC  . DULoxetine  20 mg Oral Daily  . feeding supplement (PRO-STAT SUGAR FREE 64)  30 mL Oral Daily  . ferrous sulfate  325 mg Oral BID  . hydrALAZINE  10 mg Oral TID  . isosorbide dinitrate  20 mg Oral TID  . levETIRAcetam  500 mg Oral BID  . sodium chloride  500 mL Intravenous Once  . sodium chloride  3 mL Intravenous Q12H  . warfarin  1.5 mg Oral q1800  . Warfarin - Pharmacist Dosing Inpatient   Does not apply q1800   Continuous Infusions:  PRN Meds:.senna-docusate Assessment/Plan: Principal Problem:   Lethargy Active Problems:   Type 2 diabetes mellitus with complication   HLD (hyperlipidemia)   GERD   Essential hypertension   DVT (deep venous thrombosis)   Chronic kidney disease, stage 3   CHF (congestive heart failure)  [name] is a 53 year old female with DM2, HLD, GERD, HTN, DVT, CKD Stage 3, CHF who hospitalized with lethargy possibly 2/2 AOCKD found to have possible UTI.  AOCKD Stage 3: Crt 3.9 this AM. Crt has been steadily increasing since her last hospital stay, but BUN is disproportionately higher. It is unclear if she was receiving Lasix or metolazone in the setting of low PO intake. Per EHR, she was not to continue  with dialysis.  -Hold Lasix & metolazone -Give 500cc bolus over 4 hours -Request MAR from SNF [facility]) -Update sister [name] as things develop -Advance diet as tolerated  Possible UTI: Chronic indwelling catheter could account for UA findings as she is asymptomatic. -Change catheter & repeat UA -Defer UTI therapy  CHF: Holding Lasix & metolazone for now.   DM2: CBGs trending low 100s. Continue monitoring.  HTN: Continue home meds.   HLD: Continue home meds.  Chronic anemia: Continue home meds.  Depression/anxiety: Continue home meds.  #FEN:  -Diet: soft  #DVT prophylaxis: Coumadin  #CODE STATUS: FULL CODE  Dispo: Disposition is deferred at this time, awaiting improvement of current medical problems.  Anticipated discharge in approximately 1-2 day(s).  The patient does have a current PCP Rogene Houston, MD) and does not need an [facility] hospital follow-up appointment after discharge.  The patient does have transportation limitations that hinder transportation to clinic appointments.  .Services Needed at time of discharge: Y = Yes, Blank = No PT:   OT:   RN:   Equipment:   Other:     LOS: 0 days   Charlott Rakes, MD 04/05/2014, 11:38 AM

## 2014-04-05 NOTE — ED Notes (Signed)
Patient transported to CT 

## 2014-04-05 NOTE — Progress Notes (Signed)
ANTICOAGULATION CONSULT NOTE - Initial Consult  Pharmacy Consult for coumadin Indication: afib. Hx DVT/PE  Allergies  Allergen Reactions  . Ace Inhibitors Shortness Of Breath  . Latex Hives  . Lisinopril Other (See Comments)    Per MAR  . Vancomycin Other (See Comments)    Per [facility]    Patient Measurements:    Weight: 112.6 kg on 03/19/14  Vital Signs: Temp: 97.6 F (36.4 C) (10/26 0914) Temp Source: Oral (10/26 0914) BP: 149/64 mmHg (10/26 0914) Pulse Rate: 86 (10/26 0914)  Labs:  Recent Labs  04/05/14 0123 04/05/14 0134 04/05/14 0729  HGB 13.2 16.3*  --   HCT 42.8 48.0*  --   PLT 360  --   --   LABPROT 24.6*  --   --   INR 2.19*  --   --   CREATININE 3.90* 3.60* 3.88*  CKTOTAL 28  --   --   TROPONINI <0.30  --  <0.30    The CrCl is unknown because both a height and weight (above a minimum accepted value) are required for this calculation.   Medical History: Past Medical History  Diagnosis Date  . HTN (hypertension)   . HLD (hyperlipidemia)   . History of methicillin resistant staphylococcus aureus (MRSA)   . CVA (cerebral infarction)   . Anemia   . Tachycardia   . Obesity   . Lower back pain     Chronic  . Idiopathic peripheral neuropathy     NOS  . DJD (degenerative joint disease)   . Headache(784.0)   . GERD (gastroesophageal reflux disease)   . Depression   . Anxiety   . Hypokalemia     resolved  . Cerebral infarct     left parietal and bilateral  . Esophagitis     distal  . Fluid overload     compensated  . Urinary retention   . H/O: pneumonia   . History of bacteremia   . Surgery, other elective     of the ear  . Stroke     Hx of left frontoparietal stroke in 12/11. History of right brain stroke.  . Cardiomyopathy     Echocardiogram 04/23/11: Mild LVH, EF 30-35%, mild MR, mild LAE, mild-moderate RVE.  TEE 04/25/11: Moderate LVH, severe biventricular dysfunction, EF 20-25%, moderate to severe MR, moderate TR, biatrial enlargement, no  PFO, negative bubble study  . Shortness of breath     with exertion   . Sleep apnea     no machine used  pt does not remember test for sleep apnea  . DM type 2 (diabetes mellitus, type 2)     type two   oral meds only   . 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 UTI (lower urinary tract infection) 10/19/11    had approx April 2013  . Osteomyelitis   . DJD (degenerative joint disease)   . Urinary retention   . Pneumonia   . Peripheral vascular disease     Medications:  Prescriptions prior to admission  Medication Sig Dispense Refill  . acetaminophen (TYLENOL) 325 MG tablet Take 2 tablets (650 mg total) by mouth 3 (three) times daily.  90 tablet  3  . allopurinol (ZYLOPRIM) 100 MG tablet Take 100 mg by mouth daily.      Marland Kitchen ALPRAZolam (XANAX) 0.25 MG tablet Take 0.25 mg by mouth every 8 (eight) hours as needed for anxiety.      . Amino  Acids-Protein Hydrolys (FEEDING SUPPLEMENT, PRO-STAT SUGAR FREE 64,) LIQD Take 30 mLs by mouth daily.      Marland Kitchen atorvastatin (LIPITOR) 20 MG tablet Take 20 mg by mouth at bedtime.      . carvedilol (COREG) 3.125 MG tablet Take 3.125 mg by mouth 2 (two) times daily with a meal.      . DULoxetine (CYMBALTA) 20 MG capsule Take 1 capsule (20 mg total) by mouth daily.  30 capsule  3  . ferrous sulfate 325 (65 FE) MG tablet Take 325 mg by mouth 2 (two) times daily.       . furosemide (LASIX) 80 MG tablet Take 2 tablets (160 mg total) by mouth 3 (three) times daily.  180 tablet  0  . hydrALAZINE (APRESOLINE) 10 MG tablet Take 10 mg by mouth 3 (three) times daily.      Marland Kitchen HYDROmorphone (DILAUDID) 2 MG tablet Take 1 tablet (2 mg total) by mouth every 4 (four) hours as needed for moderate pain or severe pain.  30 tablet  0  . isosorbide dinitrate (ISORDIL) 20 MG tablet Take 20 mg by mouth 3 (three) times daily.      Marland Kitchen levETIRAcetam (KEPPRA) 500 MG tablet Take 500 mg by mouth 2 (two) times daily.      . Menthol, Topical Analgesic, (ICY HOT) 5 % PADS Apply 1 patch  topically daily. Apply to each stump      . metolazone (ZAROXOLYN) 5 MG tablet Take 1 tablet (5 mg total) by mouth 2 (two) times daily.  60 tablet  0  . MICONAZOLE NITRATE EX Apply 1 application topically 2 (two) times daily.      . Multiple Vitamin (MULTIVITAMIN WITH MINERALS) TABS tablet Take 1 tablet by mouth daily.      . pregabalin (LYRICA) 25 MG capsule Take 1 capsule (25 mg total) by mouth at bedtime.  30 capsule  0  . tiZANidine (ZANAFLEX) 4 MG tablet Take 4 mg by mouth every 8 (eight) hours as needed for muscle spasms.      Marland Kitchen warfarin (COUMADIN) 3 MG tablet Take 1.5 mg by mouth at bedtime.      . Alum & Mag Hydroxide-Simeth (GI COCKTAIL) SUSP suspension Take 30 mLs by mouth 2 (two) times daily as needed for indigestion (Chest Pain). Shake well.  1800 mL  3  . senna-docusate (SENOKOT-S) 8.6-50 MG per tablet Take 1 tablet by mouth at bedtime as needed for mild constipation.  30 tablet  0    Assessment: 53 yo F on coumadin for afib/hx PE/DVT.  INR therapeutic at 2.19 on home dose of 1.5 mg qhs.  CBC wnl.  No bleeding reported.   Goal of Therapy:  INR 2-3   Plan:  -coumadin 1.5 mg q1800 -daily INR  Eudelia Bunch, Pharm.D. QP:[id number] 04/05/2014 9:24 AM

## 2014-04-05 NOTE — ED Notes (Signed)
CBG 126  

## 2014-04-06 DIAGNOSIS — I129 Hypertensive chronic kidney disease with stage 1 through stage 4 chronic kidney disease, or unspecified chronic kidney disease: Secondary | ICD-10-CM | POA: Diagnosis not present

## 2014-04-06 DIAGNOSIS — R5383 Other fatigue: Secondary | ICD-10-CM

## 2014-04-06 DIAGNOSIS — I509 Heart failure, unspecified: Secondary | ICD-10-CM

## 2014-04-06 DIAGNOSIS — N183 Chronic kidney disease, stage 3 (moderate): Secondary | ICD-10-CM

## 2014-04-06 LAB — CBC WITH DIFFERENTIAL/PLATELET
Basophils Absolute: 0.1 10*3/uL (ref 0.0–0.1)
Basophils Relative: 1 % (ref 0–1)
EOS PCT: 7 % — AB (ref 0–5)
Eosinophils Absolute: 0.4 10*3/uL (ref 0.0–0.7)
HCT: 41.3 % (ref 36.0–46.0)
Hemoglobin: 12.9 g/dL (ref 12.0–15.0)
LYMPHS ABS: 1.5 10*3/uL (ref 0.7–4.0)
Lymphocytes Relative: 28 % (ref 12–46)
MCH: 28.7 pg (ref 26.0–34.0)
MCHC: 31.2 g/dL (ref 30.0–36.0)
MCV: 92 fL (ref 78.0–100.0)
Monocytes Absolute: 0.6 10*3/uL (ref 0.1–1.0)
Monocytes Relative: 11 % (ref 3–12)
Neutro Abs: 2.9 10*3/uL (ref 1.7–7.7)
Neutrophils Relative %: 54 % (ref 43–77)
PLATELETS: 342 10*3/uL (ref 150–400)
RBC: 4.49 MIL/uL (ref 3.87–5.11)
RDW: 18.5 % — AB (ref 11.5–15.5)
WBC: 5.4 10*3/uL (ref 4.0–10.5)

## 2014-04-06 LAB — RENAL FUNCTION PANEL
ANION GAP: 20 — AB (ref 5–15)
Albumin: 2.9 g/dL — ABNORMAL LOW (ref 3.5–5.2)
BUN: 101 mg/dL — ABNORMAL HIGH (ref 6–23)
CO2: 26 mEq/L (ref 19–32)
Calcium: 9.5 mg/dL (ref 8.4–10.5)
Chloride: 93 mEq/L — ABNORMAL LOW (ref 96–112)
Creatinine, Ser: 3.56 mg/dL — ABNORMAL HIGH (ref 0.50–1.10)
GFR calc non Af Amer: 14 mL/min — ABNORMAL LOW (ref >90)
GFR, EST AFRICAN AMERICAN: 16 mL/min — AB (ref >90)
GLUCOSE: 102 mg/dL — AB (ref 70–99)
POTASSIUM: 4.6 meq/L (ref 3.7–5.3)
Phosphorus: 6.1 mg/dL — ABNORMAL HIGH (ref 2.3–4.6)
SODIUM: 139 meq/L (ref 137–147)

## 2014-04-06 LAB — BASIC METABOLIC PANEL
Anion gap: 18 — ABNORMAL HIGH (ref 5–15)
BUN: 98 mg/dL — ABNORMAL HIGH (ref 6–23)
CHLORIDE: 93 meq/L — AB (ref 96–112)
CO2: 26 mEq/L (ref 19–32)
Calcium: 9.2 mg/dL (ref 8.4–10.5)
Creatinine, Ser: 3.4 mg/dL — ABNORMAL HIGH (ref 0.50–1.10)
GFR calc non Af Amer: 15 mL/min — ABNORMAL LOW (ref >90)
GFR, EST AFRICAN AMERICAN: 17 mL/min — AB (ref >90)
GLUCOSE: 135 mg/dL — AB (ref 70–99)
POTASSIUM: 4.3 meq/L (ref 3.7–5.3)
Sodium: 137 mEq/L (ref 137–147)

## 2014-04-06 LAB — PROTIME-INR
INR: 1.92 — ABNORMAL HIGH (ref 0.00–1.49)
Prothrombin Time: 22.2 seconds — ABNORMAL HIGH (ref 11.6–15.2)

## 2014-04-06 LAB — URINE CULTURE

## 2014-04-06 LAB — GLUCOSE, CAPILLARY: GLUCOSE-CAPILLARY: 101 mg/dL — AB (ref 70–99)

## 2014-04-06 MED ORDER — WARFARIN SODIUM 2.5 MG PO TABS
2.5000 mg | ORAL_TABLET | Freq: Once | ORAL | Status: AC
  Administered 2014-04-06: 2.5 mg via ORAL
  Filled 2014-04-06: qty 1

## 2014-04-06 MED ORDER — SODIUM CHLORIDE 0.9 % IV BOLUS (SEPSIS)
500.0000 mL | Freq: Once | INTRAVENOUS | Status: AC
  Administered 2014-04-06: 500 mL via INTRAVENOUS

## 2014-04-06 MED ORDER — CHLORHEXIDINE GLUCONATE 0.12 % MT SOLN
15.0000 mL | Freq: Two times a day (BID) | OROMUCOSAL | Status: DC
  Administered 2014-04-06 – 2014-04-09 (×6): 15 mL via OROMUCOSAL
  Filled 2014-04-06 (×5): qty 15

## 2014-04-06 MED ORDER — CETYLPYRIDINIUM CHLORIDE 0.05 % MT LIQD
7.0000 mL | Freq: Two times a day (BID) | OROMUCOSAL | Status: DC
  Administered 2014-04-06 – 2014-04-09 (×5): 7 mL via OROMUCOSAL

## 2014-04-06 NOTE — Progress Notes (Signed)
Patient ID: [name], female    DOB: 1961-03-11, 53 y.o.   MRN: [id number] Nephrology Progress Note  S: sleeping and only opens eyes briefly in response to voice   O:BP 141/65  Pulse 85  Temp(Src) 97.3 F (36.3 C) (Axillary)  Resp 17  Wt 197 lb 6.4 oz (89.54 kg)  SpO2 97%  Intake/Output Summary (Last 24 hours) at 04/06/14 0817 Last data filed at 04/06/14 0703  Gross per 24 hour  Intake    720 ml  Output    850 ml  Net   -130 ml   Intake/Output: I/O last 3 completed shifts: In: 42 [P.O.:720; I.V.:50] Out: 550 [Urine:550]  Intake/Output this shift:  Total I/O In: -  Out: 300 [Urine:300] Weight change:  Gen: NAD CVS: RRR, quiet s1/s2, sys murmur Resp: bipap mask, rhonchi, normal effort Abd: soft, obese, +BS, presacral edema Ext: right bka, left aka Neuro: sleeping   Recent Labs Lab 04/05/14 0123 04/05/14 0134 04/05/14 0729 04/05/14 1425 04/05/14 2008 04/06/14 0415  NA 147 145 147 140 142 139  K 4.0 3.9 4.1 5.7* 4.1 4.6  CL 97 100 97 93* 95* 93*  CO2 32  --  31 29 29 26   GLUCOSE 123* 128* 123* 193* 142* 102*  BUN 106* 97* 109* 103* 101* 101*  CREATININE 3.90* 3.60* 3.88* 3.59* 3.65* 3.56*  ALBUMIN 3.3*  --   --   --   --  2.9*  CALCIUM 9.7  --  9.8 9.3 9.3 9.5  PHOS  --   --   --   --   --  6.1*  AST 51*  --   --   --   --   --   ALT 26  --   --   --   --   --    Liver Function Tests:  Recent Labs Lab 04/05/14 0123 04/06/14 0415  AST 51*  --   ALT 26  --   ALKPHOS 117  --   BILITOT 0.6  --   PROT 8.9*  --   ALBUMIN 3.3* 2.9*   No results found for this basename: LIPASE, AMYLASE,  in the last 168 hours No results found for this basename: AMMONIA,  in the last 168 hours CBC:  Recent Labs Lab 04/05/14 0123 04/05/14 0134 04/06/14 0415  WBC 6.6  --  5.4  NEUTROABS 3.9  --  2.9  HGB 13.2 16.3* 12.9  HCT 42.8 48.0* 41.3  MCV 92.6  --  92.0  PLT 360  --  342   Cardiac Enzymes:  Recent Labs Lab 04/05/14 0123 04/05/14 0729  04/05/14 1312 04/05/14 1845  CKTOTAL 28  --   --   --   TROPONINI <0.30 <0.30 <0.30 <0.30   CBG:  Recent Labs Lab 04/05/14 0141 04/06/14 0502  GLUCAP 126* 101*    Iron Studies: No results found for this basename: IRON, TIBC, TRANSFERRIN, FERRITIN,  in the last 72 hours Studies/Results: Dg Chest 2 View  04/05/2014   CLINICAL DATA:  Syncope and shortness of breath.  Initial encounter  EXAM: CHEST  2 VIEW  COMPARISON:  03/13/2014  FINDINGS: Chronic moderate cardiomegaly.  Stable upper mediastinal contours.  Low lung volumes. Increased density over the spine in the lateral projection is likely overlapping soft tissues given there is no evidence of consolidation in the frontal projection and the diaphragm remains visible. No pulmonary edema, effusion, or pneumothorax. No acute osseous findings.  IMPRESSION: Hypoaeration.  No edema or  pneumonia.   Electronically Signed   By: Jorje Guild M.D.   On: 04/05/2014 02:05   Ct Head Wo Contrast  04/05/2014   CLINICAL DATA:  Found unconscious.  Initial encounter  EXAM: CT HEAD WITHOUT CONTRAST  TECHNIQUE: Contiguous axial images were obtained from the base of the skull through the vertex without intravenous contrast.  COMPARISON:  03/19/2013  FINDINGS: Skull and Sinuses:Negative for fracture or destructive process. The mastoids, middle ears, and imaged paranasal sinuses are clear.  Orbits: No acute abnormality.  Brain: No evidence of acute abnormality, such as acute infarction, hemorrhage, hydrocephalus, or mass lesion/mass effect.  Remote cortical and subcortical infarcts in the central right MCA territory and the posterior watershed region on the left. Dystrophic mineralization has developed in both infarcts since 2014. Extensive remote small vessel ischemic injury with confluent disease in the deep cerebral white matter and multiple remote bilateral lacunar infarcts in the deep gray nuclei. Generalized cerebral volume loss.  IMPRESSION: 1. No acute  intracranial findings. 2. Extensive remote ischemic injury, as above.   Electronically Signed   By: Jorje Guild M.D.   On: 04/05/2014 02:44   . antiseptic oral rinse  7 mL Mouth Rinse q12n4p  . atorvastatin  20 mg Oral QHS  . carvedilol  3.125 mg Oral BID WC  . chlorhexidine  15 mL Mouth Rinse BID  . DULoxetine  20 mg Oral Daily  . feeding supplement (PRO-STAT SUGAR FREE 64)  30 mL Oral Daily  . ferrous sulfate  325 mg Oral BID  . hydrALAZINE  10 mg Oral TID  . isosorbide dinitrate  20 mg Oral TID  . levETIRAcetam  500 mg Oral BID  . sodium chloride  3 mL Intravenous Q12H  . warfarin  1.5 mg Oral q1800  . Warfarin - Pharmacist Dosing Inpatient   Does not apply q1800    BMET    Component Value Date/Time   NA 139 04/06/2014 0415   K 4.6 04/06/2014 0415   CL 93* 04/06/2014 0415   CO2 26 04/06/2014 0415   GLUCOSE 102* 04/06/2014 0415   BUN 101* 04/06/2014 0415   CREATININE 3.56* 04/06/2014 0415   CALCIUM 9.5 04/06/2014 0415   GFRNONAA 14* 04/06/2014 0415   GFRAA 16* 04/06/2014 0415   CBC    Component Value Date/Time   WBC 5.4 04/06/2014 0415   RBC 4.49 04/06/2014 0415   RBC 3.22* 03/14/2014 0308   HGB 12.9 04/06/2014 0415   HCT 41.3 04/06/2014 0415   PLT 342 04/06/2014 0415   MCV 92.0 04/06/2014 0415   MCH 28.7 04/06/2014 0415   MCHC 31.2 04/06/2014 0415   RDW 18.5* 04/06/2014 0415   LYMPHSABS 1.5 04/06/2014 0415   MONOABS 0.6 04/06/2014 0415   EOSABS 0.4 04/06/2014 0415   BASOSABS 0.1 04/06/2014 0415    Assessment/Plan:  1. CKD: baseline Scr 2.5-3, 2.8 on d/c 10/9 and now 3.88. Resume lasix 160mg  po BID tomorrow, 2lb wt gain increase to TID, 5lb weight gain add metolazone. 2. ?UTI, chronic indwelling foley: foley changed on admission. UA large leuks, TNTC on microscopy. follow urine cx, received ceftriaxone x 1 after collection. Urine cx >100k colonoes multiple bacteria 3. Anemia: acutely elevated to 13.2, baseline appears to be 9-10. received 510mg  feraheme this  past month 4. CKD-MBD: iPTH pending 5. CHF: holding diuretics. 6. DM: per primary 7. Afib: on warfarin, per primary 8. Obesity 9. PVD 10. Seizures 11. Anemia  Tawanna Sat 8:17 AM I have seen and examined  this patient and agree with the plan of care seen, eval, examined and discussed.  See rec from yest.  Will s/o for now .  Bryttany Tortorelli L 04/06/2014, 1:58 PM

## 2014-04-06 NOTE — Evaluation (Signed)
Physical Therapy Evaluation Patient Details Name: ROXANE PUERTO MRN: [id number] DOB: November 11, 1960 Today's Date: 04/06/2014   History of Present Illness  Ms. Galer is a 53 year old female with a pertinent past medical history of CHF(EF of 20%), ESRD on dialysis, right MCA stroke, left frontoparietal stroke,  type 2 diabetes mellitus, HTN who presented to the ED by EMS after experiencing stabbing and burning chest pain during dialysis . Pt with R BKA, L AKA and L hemiplegia  Clinical Impression  Pt resides at SNF and per her report is 2-3 person A for rolling and changing brief.  Pt states she doesn't get up in a w/c cause she doesn't want to.  No skilled PT needs identified for acute care.  SNF may want to assess for positioning/contracture management in d/c environment.  Will sign off.       Follow Up Recommendations SNF    Equipment Recommendations  None recommended by PT    Recommendations for Other Services       Precautions / Restrictions Precautions Precautions: Fall Precaution Comments: bil amputee, right knee contracture, left hand contracture      Mobility  Bed Mobility Overal bed mobility: +2 for physical assistance Bed Mobility: Rolling Rolling: Total assist            Transfers                    Ambulation/Gait                Stairs            Wheelchair Mobility    Modified Rankin (Stroke Patients Only)       Balance                                             Pertinent Vitals/Pain Pain Assessment: 0-10 Pain Score: 7  Pain Location: L stump Pain Descriptors / Indicators: Constant (phantom) Pain Intervention(s): Repositioned    Home Living Family/patient expects to be discharged to:: Skilled nursing facility                 Additional Comments: Pt states shes from a SNF in [place]    Prior Function Level of Independence: Needs assistance   Gait / Transfers Assistance Needed: Pt states  is takes 2-3 people to clean and changer brief and she doesn't get up to w/c.  She states she does not want them to get her up into a w/c.  ADL's / Homemaking Assistance Needed: total assist for all ADLs        Hand Dominance   Dominant Hand: Right    Extremity/Trunk Assessment   Upper Extremity Assessment: RUE deficits/detail;LUE deficits/detail RUE Deficits / Details: 3/5 elbow and shld flex     LUE Deficits / Details: flaccid L UE.  States she uses finger splints at the SNF   Lower Extremity Assessment: RLE deficits/detail;LLE deficits/detail RLE Deficits / Details: Pt unable to move R leg, but can perform quad set. LLE Deficits / Details: AKA with flaccid extremity     Communication   Communication: No difficulties  Cognition Arousal/Alertness: Awake/alert Behavior During Therapy: Flat affect Overall Cognitive Status: No family/caregiver present to determine baseline cognitive functioning                      General Comments  Exercises        Assessment/Plan    PT Assessment All further PT needs can be met in the next venue of care  PT Diagnosis Generalized weakness   PT Problem List    PT Treatment Interventions     PT Goals (Current goals can be found in the Care Plan section) Acute Rehab PT Goals PT Goal Formulation: All assessment and education complete, DC therapy    Frequency     Barriers to discharge        Co-evaluation               End of Session   Activity Tolerance: Patient tolerated treatment well Patient left: in bed;with call bell/phone within reach Nurse Communication: Mobility status;Need for lift equipment    Functional Assessment Tool Used: objective findings and clinical judgement Functional Limitation: Changing and maintaining body position Changing and Maintaining Body Position Current Status [phone]): 100 percent impaired, limited or restricted Changing and Maintaining Body Position Goal Status (P5374):  100 percent impaired, limited or restricted Changing and Maintaining Body Position Discharge Status (M2707): 100 percent impaired, limited or restricted    Time: 0150-0205 PT Time Calculation (min): 15 min   Charges:   PT Evaluation $Initial PT Evaluation Tier I: 1 Procedure     PT G Codes:   Functional Assessment Tool Used: objective findings and clinical judgement Functional Limitation: Changing and maintaining body position    Jerimie Mancuso LUBECK 04/06/2014, 2:11 PM

## 2014-04-06 NOTE — Progress Notes (Signed)
ANTICOAGULATION CONSULT NOTE - Follow-up  Pharmacy Consult for coumadin Indication: afib, Hx DVT/PE  Allergies  Allergen Reactions  . Ace Inhibitors Shortness Of Breath  . Latex Hives  . Lisinopril Other (See Comments)    Per MAR  . Vancomycin Other (See Comments)    Per [facility]    Patient Measurements: Weight: 197 lb 6.4 oz (89.54 kg)  Weight: 112.6 kg on 03/19/14  Vital Signs: Temp: 97.4 F (36.3 C) (10/27 1022) Temp Source: Axillary (10/27 1022) BP: 137/48 mmHg (10/27 1022) Pulse Rate: 90 (10/27 1022)  Labs:  Recent Labs  04/05/14 0123 04/05/14 0134 04/05/14 0729 04/05/14 1312 04/05/14 1425 04/05/14 1845 04/05/14 2008 04/06/14 0415  HGB 13.2 16.3*  --   --   --   --   --  12.9  HCT 42.8 48.0*  --   --   --   --   --  41.3  PLT 360  --   --   --   --   --   --  342  LABPROT 24.6*  --   --   --   --   --   --  22.2*  INR 2.19*  --   --   --   --   --   --  1.92*  CREATININE 3.90* 3.60* 3.88*  --  3.59*  --  3.65* 3.56*  CKTOTAL 28  --   --   --   --   --   --   --   TROPONINI <0.30  --  <0.30 <0.30  --  <0.30  --   --     The CrCl is unknown because both a height and weight (above a minimum accepted value) are required for this calculation.  Medications:  Prescriptions prior to admission  Medication Sig Dispense Refill  . acetaminophen (TYLENOL) 325 MG tablet Take 2 tablets (650 mg total) by mouth 3 (three) times daily.  90 tablet  3  . allopurinol (ZYLOPRIM) 100 MG tablet Take 100 mg by mouth daily.      Marland Kitchen ALPRAZolam (XANAX) 0.25 MG tablet Take 0.25 mg by mouth every 8 (eight) hours as needed for anxiety.      . Amino Acids-Protein Hydrolys (FEEDING SUPPLEMENT, PRO-STAT SUGAR FREE 64,) LIQD Take 30 mLs by mouth daily.      Marland Kitchen atorvastatin (LIPITOR) 20 MG tablet Take 20 mg by mouth at bedtime.      . carvedilol (COREG) 3.125 MG tablet Take 3.125 mg by mouth 2 (two) times daily with a meal.      . DULoxetine (CYMBALTA) 20 MG capsule Take 1 capsule (20 mg total) by  mouth daily.  30 capsule  3  . ferrous sulfate 325 (65 FE) MG tablet Take 325 mg by mouth 2 (two) times daily.       . furosemide (LASIX) 80 MG tablet Take 2 tablets (160 mg total) by mouth 3 (three) times daily.  180 tablet  0  . hydrALAZINE (APRESOLINE) 10 MG tablet Take 10 mg by mouth 3 (three) times daily.      Marland Kitchen HYDROmorphone (DILAUDID) 2 MG tablet Take 1 tablet (2 mg total) by mouth every 4 (four) hours as needed for moderate pain or severe pain.  30 tablet  0  . isosorbide dinitrate (ISORDIL) 20 MG tablet Take 20 mg by mouth 3 (three) times daily.      Marland Kitchen levETIRAcetam (KEPPRA) 500 MG tablet Take 500 mg by mouth 2 (two) times daily.      Marland Kitchen  Menthol, Topical Analgesic, (ICY HOT) 5 % PADS Apply 1 patch topically daily. Apply to each stump      . metolazone (ZAROXOLYN) 5 MG tablet Take 1 tablet (5 mg total) by mouth 2 (two) times daily.  60 tablet  0  . MICONAZOLE NITRATE EX Apply 1 application topically 2 (two) times daily.      . Multiple Vitamin (MULTIVITAMIN WITH MINERALS) TABS tablet Take 1 tablet by mouth daily.      . pregabalin (LYRICA) 25 MG capsule Take 1 capsule (25 mg total) by mouth at bedtime.  30 capsule  0  . tiZANidine (ZANAFLEX) 4 MG tablet Take 4 mg by mouth every 8 (eight) hours as needed for muscle spasms.      Marland Kitchen warfarin (COUMADIN) 3 MG tablet Take 1.5 mg by mouth at bedtime.      . Alum & Mag Hydroxide-Simeth (GI COCKTAIL) SUSP suspension Take 30 mLs by mouth 2 (two) times daily as needed for indigestion (Chest Pain). Shake well.  1800 mL  3  . senna-docusate (SENOKOT-S) 8.6-50 MG per tablet Take 1 tablet by mouth at bedtime as needed for mild constipation.  30 tablet  0    Assessment: 53 yo F on coumadin for afib/hx PE/DVT.  INR is now slightly subtherapeutic on her home regimen. CBC is WNL and no bleeding noted.    Goal of Therapy:  INR 2-3   Plan:  1. Coumadin 2.5mg  PO x 1 tonight 2. Daily INR  Salome Arnt, PharmD, BCPS Pager # [phone] 04/06/2014 12:50  PM

## 2014-04-06 NOTE — Progress Notes (Signed)
OT Cancellation Note  Patient Details Name: [name] MRN: [id number] DOB: 05-04-1961   Cancelled Treatment:    Reason Eval/Treat Not Completed: Other (comment) (Pt from SNF and returning to SNF) Deferring OT needs to next venue.  [name] 04/06/2014, 12:11 PM

## 2014-04-06 NOTE — Progress Notes (Signed)
  Date: 04/06/2014  Patient name: [name]  Medical record number: [id number]  Date of birth: Apr 26, 1961   This patient has been seen and the plan of care was discussed with the house staff. Please see their note for complete details. I concur with their findings.  Appears by discussion with NH that patient was not found down, but rather had been having decreased PO intake and requested to be brought to the hospital.   Sid Falcon, MD 04/06/2014, 11:47 PM

## 2014-04-06 NOTE — Progress Notes (Addendum)
Subjective: She was asleep this AM and did not want to wake up for me. Repeat UA yesterday appeared cleaner and less concerning for UTI.  I was able to speak to her sister today give her an update. She is considering another facility and would like ongoing palliative care. I also told her that this change in kidney function could represent a reversible change but could also represent a new change in how sick she is. I also advised her to revisit her sister's code status in light of all these developments just to make sure that we were advocating for what her sister wanted. She appreciated my concern.  Objective: Vital signs in last 24 hours: Filed Vitals:   04/06/14 0507 04/06/14 1022 04/06/14 1435 04/06/14 1722  BP: 141/65 137/48 140/55 138/70  Pulse: 85 90 85 92  Temp: 97.3 F (36.3 C) 97.4 F (36.3 C) 97.7 F (36.5 C) 98 F (36.7 C)  TempSrc: Axillary Axillary Oral Oral  Resp: 17 18 16 18   Weight: 197 lb 6.4 oz (89.54 kg)     SpO2: 97% 98% 100% 99%   Weight change:   Intake/Output Summary (Last 24 hours) at 04/06/14 1725 Last data filed at 04/06/14 1447  Gross per 24 hour  Intake    540 ml  Output   1000 ml  Net   -460 ml   Filed Vitals:   04/06/14 1722  BP: 138/70  Pulse: 92  Temp: 98 F (36.7 C)  Resp: 18   General: resting in bed, CPAP HEENT: PERRL, EOMI, no scleral icterus Cardiac: RRR, no rubs, murmurs or gallops Pulm: clear to auscultation bilaterally, no wheezes, rales, or rhonchi in the anterior fields Abd: soft, nontender, nondistended, BS present Ext: L AKA, R BKA, LUE appears flaccid Neuro: alert and oriented X3, cranial nerves II-XII grossly intact, L sided deficits 2/2 prior CVA  Lab Results: Basic Metabolic Panel:  Recent Labs Lab 04/05/14 2008 04/06/14 0415  NA 142 139  K 4.1 4.6  CL 95* 93*  CO2 29 26  GLUCOSE 142* 102*  BUN 101* 101*  CREATININE 3.65* 3.56*  CALCIUM 9.3 9.5  PHOS  --  6.1*   CBG:  Recent Labs Lab 04/05/14 0141  04/06/14 0502  GLUCAP 126* 101*   Coagulation:  Recent Labs Lab 04/05/14 0123 04/06/14 0415  LABPROT 24.6* 22.2*  INR 2.19* 1.92*   Urinalysis:  Recent Labs Lab 04/05/14 0316 04/05/14 1633  COLORURINE YELLOW YELLOW  LABSPEC 1.015 1.013  PHURINE 6.5 7.5  GLUCOSEU NEGATIVE 100*  HGBUR SMALL* SMALL*  BILIRUBINUR NEGATIVE NEGATIVE  KETONESUR NEGATIVE NEGATIVE  PROTEINUR >300* >300*  UROBILINOGEN 0.2 0.2  NITRITE NEGATIVE NEGATIVE  LEUKOCYTESUR LARGE* SMALL*   Studies/Results: Dg Chest 2 View  04/05/2014   CLINICAL DATA:  Syncope and shortness of breath.  Initial encounter  EXAM: CHEST  2 VIEW  COMPARISON:  03/13/2014  FINDINGS: Chronic moderate cardiomegaly.  Stable upper mediastinal contours.  Low lung volumes. Increased density over the spine in the lateral projection is likely overlapping soft tissues given there is no evidence of consolidation in the frontal projection and the diaphragm remains visible. No pulmonary edema, effusion, or pneumothorax. No acute osseous findings.  IMPRESSION: Hypoaeration.  No edema or pneumonia.   Electronically Signed   By: Jorje Guild M.D.   On: 04/05/2014 02:05   Ct Head Wo Contrast  04/05/2014   CLINICAL DATA:  Found unconscious.  Initial encounter  EXAM: CT HEAD WITHOUT CONTRAST  TECHNIQUE: Contiguous  axial images were obtained from the base of the skull through the vertex without intravenous contrast.  COMPARISON:  03/19/2013  FINDINGS: Skull and Sinuses:Negative for fracture or destructive process. The mastoids, middle ears, and imaged paranasal sinuses are clear.  Orbits: No acute abnormality.  Brain: No evidence of acute abnormality, such as acute infarction, hemorrhage, hydrocephalus, or mass lesion/mass effect.  Remote cortical and subcortical infarcts in the central right MCA territory and the posterior watershed region on the left. Dystrophic mineralization has developed in both infarcts since 2014. Extensive remote small vessel  ischemic injury with confluent disease in the deep cerebral white matter and multiple remote bilateral lacunar infarcts in the deep gray nuclei. Generalized cerebral volume loss.  IMPRESSION: 1. No acute intracranial findings. 2. Extensive remote ischemic injury, as above.   Electronically Signed   By: Jorje Guild M.D.   On: 04/05/2014 02:44   Medications: I have reviewed the patient's current medications. Scheduled Meds: . antiseptic oral rinse  7 mL Mouth Rinse q12n4p  . atorvastatin  20 mg Oral QHS  . carvedilol  3.125 mg Oral BID WC  . chlorhexidine  15 mL Mouth Rinse BID  . DULoxetine  20 mg Oral Daily  . feeding supplement (PRO-STAT SUGAR FREE 64)  30 mL Oral Daily  . ferrous sulfate  325 mg Oral BID  . hydrALAZINE  10 mg Oral TID  . isosorbide dinitrate  20 mg Oral TID  . levETIRAcetam  500 mg Oral BID  . sodium chloride  500 mL Intravenous Once  . sodium chloride  3 mL Intravenous Q12H  . warfarin  2.5 mg Oral ONCE-1800  . Warfarin - Pharmacist Dosing Inpatient   Does not apply q1800   Continuous Infusions:  PRN Meds:.senna-docusate Assessment/Plan: Principal Problem:   Lethargy Active Problems:   Type 2 diabetes mellitus with complication   HLD (hyperlipidemia)   GERD   Essential hypertension   DVT (deep venous thrombosis)   Chronic kidney disease, stage 4, severely decreased GFR   CHF (congestive heart failure)  Ms. Korzeniewski is a 53 year old female with DM2, HLD, GERD, HTN, DVT, CKD Stage 3, CHF who hospitalized with lethargy possibly 2/2 AOCKD.  AOCKD Stage 3: Crt 3.6 this AM, stable from yesterday which does put her at Stage 4. It appears she was receiving Lasix or metolazone in the setting of low PO intake per MAR received yesterday.  -Hold Lasix & metolazone until tomorrow -Give 500cc bolus over 4 hours -Update sister Mackie Pai as things develop -Advance diet as tolerated -Nephrology following, appreciate recs  CHF: Holding Lasix & metolazone for now.   DM2:  CBGs trending low 100s. Continue monitoring.  HTN: Continue home meds.   HLD: Continue home meds.  Chronic anemia: Continue home meds.  Depression/anxiety: Continue home meds.  #FEN:  -Diet: soft  #DVT prophylaxis: Coumadin  #CODE STATUS: FULL CODE  Dispo: Disposition is deferred at this time, awaiting improvement of current medical problems.  Anticipated discharge in approximately 1-2 day(s).   The patient does have a current PCP Rogene Houston, MD) and does not need an [facility] hospital follow-up appointment after discharge.  The patient does have transportation limitations that hinder transportation to clinic appointments.  .Services Needed at time of discharge: Y = Yes, Blank = No PT:   OT:   RN:   Equipment:   Other:     LOS: 1 day   Charlott Rakes, MD 04/06/2014, 5:25 PM

## 2014-04-07 DIAGNOSIS — I129 Hypertensive chronic kidney disease with stage 1 through stage 4 chronic kidney disease, or unspecified chronic kidney disease: Secondary | ICD-10-CM | POA: Diagnosis not present

## 2014-04-07 DIAGNOSIS — Z515 Encounter for palliative care: Secondary | ICD-10-CM

## 2014-04-07 DIAGNOSIS — R531 Weakness: Secondary | ICD-10-CM

## 2014-04-07 DIAGNOSIS — Z7189 Other specified counseling: Secondary | ICD-10-CM

## 2014-04-07 LAB — BASIC METABOLIC PANEL
Anion gap: 18 — ABNORMAL HIGH (ref 5–15)
BUN: 93 mg/dL — ABNORMAL HIGH (ref 6–23)
CO2: 26 mEq/L (ref 19–32)
CREATININE: 3.17 mg/dL — AB (ref 0.50–1.10)
Calcium: 9.1 mg/dL (ref 8.4–10.5)
Chloride: 91 mEq/L — ABNORMAL LOW (ref 96–112)
GFR, EST AFRICAN AMERICAN: 18 mL/min — AB (ref >90)
GFR, EST NON AFRICAN AMERICAN: 16 mL/min — AB (ref >90)
Glucose, Bld: 144 mg/dL — ABNORMAL HIGH (ref 70–99)
Potassium: 3.8 mEq/L (ref 3.7–5.3)
Sodium: 135 mEq/L — ABNORMAL LOW (ref 137–147)

## 2014-04-07 LAB — PARATHYROID HORMONE, INTACT (NO CA): PTH: 145 pg/mL — ABNORMAL HIGH (ref 14–64)

## 2014-04-07 LAB — PROTIME-INR
INR: 1.77 — AB (ref 0.00–1.49)
Prothrombin Time: 20.8 seconds — ABNORMAL HIGH (ref 11.6–15.2)

## 2014-04-07 LAB — GLUCOSE, CAPILLARY: GLUCOSE-CAPILLARY: 117 mg/dL — AB (ref 70–99)

## 2014-04-07 MED ORDER — SODIUM CHLORIDE 0.9 % IV BOLUS (SEPSIS)
500.0000 mL | Freq: Once | INTRAVENOUS | Status: AC
  Administered 2014-04-07: 18:00:00 via INTRAVENOUS

## 2014-04-07 MED ORDER — WARFARIN SODIUM 2.5 MG PO TABS
2.5000 mg | ORAL_TABLET | Freq: Once | ORAL | Status: AC
  Administered 2014-04-07: 2.5 mg via ORAL
  Filled 2014-04-07: qty 1

## 2014-04-07 MED ORDER — SODIUM CHLORIDE 0.9 % IV BOLUS (SEPSIS)
500.0000 mL | Freq: Once | INTRAVENOUS | Status: AC
  Administered 2014-04-07: 500 mL via INTRAVENOUS

## 2014-04-07 NOTE — Progress Notes (Signed)
Nutrition Brief Note  Patient identified on the [place]  Wt Readings from Last 15 Encounters:  04/07/14 194 lb 3.2 oz (88.089 kg)  03/19/14 248 lb 4.8 oz (112.628 kg)  08/14/13 208 lb (94.348 kg)  03/18/13 208 lb (94.348 kg)  03/18/13 208 lb (94.348 kg)  08/01/12 220 lb (99.791 kg)  08/01/12 220 lb (99.791 kg)  01/18/12 217 lb (98.431 kg)  01/18/12 217 lb (98.431 kg)  12/07/11 232 lb (105.235 kg)  12/04/11 223 lb (101.152 kg)  10/24/11 230 lb 8.9 oz (104.58 kg)  10/18/11 217 lb (98.431 kg)  10/18/11 217 lb (98.431 kg)  10/11/11 222 lb (100.699 kg)   Ms. Fenty is a 53 year old female with DM2, HLD, GERD, HTN, DVT, CKD Stage 3, CHF who hospitalized with lethargy possibly 2/2 AOCKD.  Pt from [facility]. Hx obtained by sister at bedside, who is very involved in her care. She reports poor po intake 3-5 days PTA, however, appetite is generally quite good. She ate 75% of her lunch today.  Pt sister reveals wt loss is due to fluid loss from diuretics. She is pleased that pt is on a supplement this hospitalization- noted orders for 30 ml Prostat daily.   Body mass index is 37.93 kg/(m^2). Patient meets criteria for obesity, class II based on current BMI.   Current diet order is soft, patient is consuming approximately 25-75% of meals at this time. Labs and medications reviewed.   No nutrition interventions warranted at this time. If nutrition issues arise, please consult RD.   Emanuella Nickle A. Jimmye Norman, RD, LDN Pager: [phone] After hours Pager: [phone]

## 2014-04-07 NOTE — Progress Notes (Signed)
ANTICOAGULATION CONSULT NOTE - Follow-up  Pharmacy Consult for coumadin Indication: afib, Hx DVT/PE  Allergies  Allergen Reactions  . Ace Inhibitors Shortness Of Breath  . Latex Hives  . Lisinopril Other (See Comments)    Per MAR  . Vancomycin Other (See Comments)    Per [facility]    Patient Measurements: Height: 5' (152.4 cm) Weight: 194 lb 3.2 oz (88.089 kg) IBW/kg (Calculated) : 45.5  Weight: 112.6 kg on 03/19/14  Vital Signs: Temp: 97.8 Murphy (36.6 C) (10/28 0900) Temp Source: Oral (10/28 0900) BP: 147/48 mmHg (10/28 0900) Pulse Rate: 89 (10/28 0900)  Labs:  Recent Labs  10/Sherry/15 0123 10/Sherry/15 0134 10/Sherry/15 0729 10/Sherry/15 1312  10/Sherry/15 1845 10/Sherry/15 2008 04/06/14 0415 04/06/14 1828 04/07/14 0500  HGB 13.2 16.3*  --   --   --   --   --  12.9  --   --   HCT 42.8 48.0*  --   --   --   --   --  41.3  --   --   PLT 360  --   --   --   --   --   --  342  --   --   LABPROT 24.6*  --   --   --   --   --   --  22.2*  --  20.8*  INR 2.19*  --   --   --   --   --   --  1.92*  --  1.77*  CREATININE 3.90* 3.60* 3.88*  --   < >  --  3.65* 3.56* 3.40*  --   CKTOTAL 28  --   --   --   --   --   --   --   --   --   TROPONINI <0.30  --  <0.30 <0.30  --  <0.30  --   --   --   --   < > = values in this interval not displayed.  Estimated Creatinine Clearance: 19.3 ml/min (by C-G formula based on Cr of 3.4).  Medications:  Prescriptions prior to admission  Medication Sig Dispense Refill  . acetaminophen (TYLENOL) 325 MG tablet Take 2 tablets (650 mg total) by mouth 3 (three) times daily.  90 tablet  3  . allopurinol (ZYLOPRIM) 100 MG tablet Take 100 mg by mouth daily.      Marland Kitchen ALPRAZolam (XANAX) 0.25 MG tablet Take 0.25 mg by mouth every 8 (eight) hours as needed for anxiety.      . Amino Acids-Protein Hydrolys (FEEDING SUPPLEMENT, PRO-STAT SUGAR FREE 64,) LIQD Take 30 mLs by mouth daily.      Marland Kitchen atorvastatin (LIPITOR) 20 MG tablet Take 20 mg by mouth at bedtime.      . carvedilol  (COREG) 3.125 MG tablet Take 3.125 mg by mouth 2 (two) times daily with a meal.      . DULoxetine (CYMBALTA) 20 MG capsule Take 1 capsule (20 mg total) by mouth daily.  30 capsule  3  . ferrous sulfate 325 (65 FE) MG tablet Take 325 mg by mouth 2 (two) times daily.       . furosemide (LASIX) 80 MG tablet Take 2 tablets (160 mg total) by mouth 3 (three) times daily.  180 tablet  0  . hydrALAZINE (APRESOLINE) 10 MG tablet Take 10 mg by mouth 3 (three) times daily.      Marland Kitchen HYDROmorphone (DILAUDID) 2 MG tablet Take 1 tablet (2 mg total)  by mouth every 4 (four) hours as needed for moderate pain or severe pain.  30 tablet  0  . isosorbide dinitrate (ISORDIL) 20 MG tablet Take 20 mg by mouth 3 (three) times daily.      Marland Kitchen levETIRAcetam (KEPPRA) 500 MG tablet Take 500 mg by mouth 2 (two) times daily.      . Menthol, Topical Analgesic, (ICY HOT) 5 % PADS Apply 1 patch topically daily. Apply to each stump      . metolazone (ZAROXOLYN) 5 MG tablet Take 1 tablet (5 mg total) by mouth 2 (two) times daily.  60 tablet  0  . MICONAZOLE NITRATE EX Apply 1 application topically 2 (two) times daily.      . Multiple Vitamin (MULTIVITAMIN WITH MINERALS) TABS tablet Take 1 tablet by mouth daily.      . pregabalin (LYRICA) 25 MG capsule Take 1 capsule (25 mg total) by mouth at bedtime.  30 capsule  0  . tiZANidine (ZANAFLEX) 4 MG tablet Take 4 mg by mouth every 8 (eight) hours as needed for muscle spasms.      Marland Kitchen warfarin (COUMADIN) 3 MG tablet Take 1.5 mg by mouth at bedtime.      . Alum & Mag Hydroxide-Simeth (GI COCKTAIL) SUSP suspension Take 30 mLs by mouth 2 (two) times daily as needed for indigestion (Chest Pain). Shake well.  1800 mL  3  . senna-docusate (SENOKOT-S) 8.6-50 MG per tablet Take 1 tablet by mouth at bedtime as needed for mild constipation.  30 tablet  0    Assessment: 53 yo Murphy on coumadin for afib/hx PE/DVT.  INR remains subtherapeutic CBC is WNL and no bleeding noted.    Goal of Therapy:  INR 2-3    Plan:  1. Coumadin 2.5mg  PO x 1 tonight 2. Daily INR  Thanks for allowing pharmacy to be a part of this patient's care.  Excell Seltzer, PharmD Clinical Pharmacist, [phone]  04/07/2014 10:21 AM

## 2014-04-07 NOTE — Progress Notes (Signed)
  Date: 04/07/2014  Patient name: [name]  Medical record number: [id number]  Date of birth: 03-Jun-1961   This patient has been seen and the plan of care was discussed with the house staff. Please see their note for complete details. I concur with their findings with the following additions/corrections:  [name] appears to be improving with small amounts of fluid and slow improvement in renal function.  She has a very tenuous status, as she has required aggressive diuresis in the past due to her CHF.  Currently, her diuretics (lasix and metolazone) are on hold and we are giving her small boluses of fluid and encouraging PO intake.  [name] did start eating more today.  Another 500 cc bolus, followed by evaluation of renal function.  If she is doing well, could consider another bolus this evening.  Will need daily weights and addition back of diuretics based on weight gain (as proposed by Nephrology note 10/27).  If she continues to improve and her appetite remains improved, she can likely go home to her SNF tomorrow.  PC team met with patient and family, and based on their pended noted it appears that they are not currently interested in hospice care and prefer to continue medical therapy to prolong life.  Appreciate that team speaking with the family re: goals of care.   Sid Falcon, MD 04/07/2014, 3:15 PM

## 2014-04-07 NOTE — Consult Note (Signed)
Patient HR:[id number] [name]      DOB: [birth year]      MZ:[id number]     Consult Note from the Palliative Medicine Team at [facility] [place] Requested by: Dr Daryll Drown     PCP: Sherry Houston, MD Reason for Consultation: Clarification of [place] and options     Phone Number:[phone]  Assessment of patients Current state:  Rapid physical and functional decline over the past six months 2/2 to chronic disease DM2, HTN, HLD, CKD4, CVA, Anemia, Systolic CHF (Last echo Q000111Q with LV EF 35-40%), atrial fibrillation on coumadin, history of DVT, OSA.  Continued physical, functional decline.   Patient and family continue to faced advanced directive decisions and anticipatory care needs.  PMT meet with this patient and family only a few weeks ago during a recent hospitalization   Consult is for review of medical treatment options, clarification of goals of care and end of life issues, disposition and options, and symptom recommendation.  This NP Wadie Lessen reviewed medical records, received report from team, assessed the patient and then meet at the patient's bedside along with her sister /HPOA  to discuss diagnosis prognosis, GOC, EOL wishes disposition and options.  A detailed discussion was had today regarding advanced directives.  Concepts specific to code status, artifical feeding and hydration, continued IV antibiotics and rehospitalization was had.  The difference between a aggressive medical intervention path  and a palliative comfort care path for this patient at this time was had.  Values and goals of care important to patient and family were attempted to be elicited.  Concept of Hospice and Palliative Care were discussed  Natural trajectory and expectations at EOL were discussed.  Questions and concerns addressed.  Hard Choices booklet left for review. Family encouraged to call with questions or concerns.  PMT will continue to support holistically.   Goals of Care: 1.  Code Status    Full code  Strongly recommend continued conversation understanding the limitation of medical  interventions in terminal situations.  Education offered regarding the importance of advanced directives in order to enhance patient centered care.   2. Scope of Treatment: Patient and family are open to all available and offered medical interventions to prolong life.  They understand the complex medical situation but "choose to remain hopefull, we believe in God and miracles"  3. Disposition:  SNF for "more and better rehabilitation" when medically stable.  Recommend Palliative care services to follow on discharge understand the limitations of this service in the community   4. Symptom Management:   1.  Weakness: Continue PT/OT as tolerated    5. Psychosocial:  Emotional support offered to patient and her sister at bedside.      Brief HPI:   [name] is a 53 year old woman with history of DM2, HTN, HLD, CKD4, CVA, Anemia, Systolic CHF (Last echo Q000111Q with LV EF 35-40%), atrial fibrillation on coumadin, history of DVT, OSA presenting after being found unconscious at her nursing facility. Upon arrival of EMS, she was awake and oriented. She does not remember the event. The last thing she remembers prior to arrival is lying in bed trying to fall asleep.   Family is not pleased with recent rehabilitation course of care   ROS: weakness and fatigue   PMH:  Past Medical History  Diagnosis Date  . HTN (hypertension)   . HLD (hyperlipidemia)   . History of methicillin resistant staphylococcus aureus (MRSA)   . CVA (cerebral infarction)   . Anemia   .  Tachycardia   . Obesity   . Lower back pain     Chronic  . Idiopathic peripheral neuropathy     NOS  . DJD (degenerative joint disease)   . Headache(784.0)   . GERD (gastroesophageal reflux disease)   . Depression   . Anxiety   . Hypokalemia     resolved  . Cerebral infarct     left parietal and bilateral  . Esophagitis      distal  . Fluid overload     compensated  . Urinary retention   . H/O: pneumonia   . History of bacteremia   . Surgery, other elective     of the ear  . Stroke     Hx of left frontoparietal stroke in 12/11. History of right brain stroke.  . Cardiomyopathy     Echocardiogram 04/23/11: Mild LVH, EF 30-35%, mild MR, mild LAE, mild-moderate RVE.  TEE 04/25/11: Moderate LVH, severe biventricular dysfunction, EF 20-25%, moderate to severe MR, moderate TR, biatrial enlargement, no PFO, negative bubble study  . Shortness of breath     with exertion   . Sleep apnea     no machine used  pt does not remember test for sleep apnea  . DM type 2 (diabetes mellitus, type 2)     type two   oral meds only   . 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 UTI (lower urinary tract infection) 10/19/11    had approx April 2013  . Osteomyelitis   . DJD (degenerative joint disease)   . Urinary retention   . Pneumonia   . Peripheral vascular disease      PSH: Past Surgical History  Procedure Laterality Date  . Tympanoplasty    . Toe amputation      left 2nd toe  . Dilation and curettage of uterus      history  . Tee without cardioversion  04/25/2011    Procedure: TRANSESOPHAGEAL ECHOCARDIOGRAM (TEE);  Surgeon: Jolaine Artist, MD;  [facility] ENDOSCOPY;  Service: Cardiovascular;  Laterality: N/A;  . Esophagogastroduodenoscopy  08/14/2011    Procedure: ESOPHAGOGASTRODUODENOSCOPY (EGD);  Surgeon: Scarlette Shorts, MD;  Location: [facility] ENDOSCOPY;  Service: Endoscopy;  Laterality: N/A;  . Amputation  01/18/2012    Procedure: AMPUTATION BELOW KNEE;  Surgeon: Newt Minion, MD;  Location: [place];  Service: Orthopedics;  Laterality: Left;  Left Below Knee Amputation  . Amputation Right 08/01/2012    Procedure: Right Below Knee Amputation;  Surgeon: Newt Minion, MD;  Location: [place];  Service: Orthopedics;  Laterality: Right;  Right Below Knee Amputation  . Pars plana vitrectomy Left 01/28/2013    Procedure:  PARS PLANA VITRECTOMY WITH 23 GAUGE;  Surgeon: Adonis Brook, MD;  Location: [place];  Service: Ophthalmology;  Laterality: Left;  . Photocoagulation with laser Left 01/28/2013    Procedure: PHOTOCOAGULATION WITH LASER;  Surgeon: Adonis Brook, MD;  Location: [place];  Service: Ophthalmology;  Laterality: Left;  ENDOLASER  . Membrane peel Left 01/28/2013    Procedure: MEMBRANE PEEL;  Surgeon: Adonis Brook, MD;  Location: [place];  Service: Ophthalmology;  Laterality: Left;  . Amputation Left 03/18/2013    Procedure: AMPUTATION ABOVE KNEE;  Surgeon: Newt Minion, MD;  Location: [place];  Service: Orthopedics;  Laterality: Left;   I have reviewed the [place] and SH and  If appropriate update it with new information. Allergies  Allergen Reactions  . Ace Inhibitors Shortness Of  Breath  . Latex Hives  . Lisinopril Other (See Comments)    Per MAR  . Vancomycin Other (See Comments)    Per MAR   Scheduled Meds: . antiseptic oral rinse  7 mL Mouth Rinse q12n4p  . atorvastatin  20 mg Oral QHS  . carvedilol  3.125 mg Oral BID WC  . chlorhexidine  15 mL Mouth Rinse BID  . DULoxetine  20 mg Oral Daily  . feeding supplement (PRO-STAT SUGAR FREE 64)  30 mL Oral Daily  . ferrous sulfate  325 mg Oral BID  . hydrALAZINE  10 mg Oral TID  . isosorbide dinitrate  20 mg Oral TID  . levETIRAcetam  500 mg Oral BID  . sodium chloride  500 mL Intravenous Once  . sodium chloride  3 mL Intravenous Q12H  . warfarin  2.5 mg Oral ONCE-1800  . Warfarin - Pharmacist Dosing Inpatient   Does not apply q1800   Continuous Infusions:  PRN Meds:.senna-docusate    BP 147/48  Pulse 89  Temp(Src) 97.8 F (36.6 C) (Oral)  Resp 18  Ht 5' (1.524 m)  Wt 88.089 kg (194 lb 3.2 oz)  BMI 37.93 kg/m2  SpO2 100%   PPS:30 % at best   Intake/Output Summary (Last 24 hours) at 04/07/14 1207 Last data filed at 04/07/14 0941  Gross per 24 hour  Intake    240 ml  Output    800 ml  Net   -560 ml    Physical Exam:  General:  chronically ill appearing, NAD HEENT:  moist buccal membranes no exudate noted Chest:  Decreased in bases, CTA CVS: RRR Abdomen: soft NT +BS Ext: L/R BKA noted, without edema Neuro: alert and oriented X2 Psych: poor insight into current medical condition  Labs: CBC    Component Value Date/Time   WBC 5.4 04/06/2014 0415   RBC 4.49 04/06/2014 0415   RBC 3.22* 03/14/2014 0308   HGB 12.9 04/06/2014 0415   HCT 41.3 04/06/2014 0415   PLT 342 04/06/2014 0415   MCV 92.0 04/06/2014 0415   MCH 28.7 04/06/2014 0415   MCHC 31.2 04/06/2014 0415   RDW 18.5* 04/06/2014 0415   LYMPHSABS 1.5 04/06/2014 0415   MONOABS 0.6 04/06/2014 0415   EOSABS 0.4 04/06/2014 0415   BASOSABS 0.1 04/06/2014 0415    BMET    Component Value Date/Time   NA 137 04/06/2014 1828   K 4.3 04/06/2014 1828   CL 93* 04/06/2014 1828   CO2 26 04/06/2014 1828   GLUCOSE 135* 04/06/2014 1828   BUN 98* 04/06/2014 1828   CREATININE 3.40* 04/06/2014 1828   CALCIUM 9.2 04/06/2014 1828   GFRNONAA 15* 04/06/2014 1828   GFRAA 17* 04/06/2014 1828    CMP     Component Value Date/Time   NA 137 04/06/2014 1828   K 4.3 04/06/2014 1828   CL 93* 04/06/2014 1828   CO2 26 04/06/2014 1828   GLUCOSE 135* 04/06/2014 1828   BUN 98* 04/06/2014 1828   CREATININE 3.40* 04/06/2014 1828   CALCIUM 9.2 04/06/2014 1828   PROT 8.9* 04/05/2014 0123   ALBUMIN 2.9* 04/06/2014 0415   AST 51* 04/05/2014 0123   ALT 26 04/05/2014 0123   ALKPHOS 117 04/05/2014 0123   BILITOT 0.6 04/05/2014 0123   GFRNONAA 15* 04/06/2014 1828   GFRAA 17* 04/06/2014 1828     Time In Time Out Total Time Spent with Patient Total Overall Time  1200 1315 70 min 75 min    Greater  than 50%  of this time was spent counseling and coordinating care related to the above assessment and plan.   Wadie Lessen NP  Palliative Medicine Team Team Phone # [phone] Pager [phone]  Discussed with Dr Posey Pronto

## 2014-04-07 NOTE — Clinical Social Work Note (Signed)
CSW presented pt's SNF bed offer to pt's sister, Mackie Pai. Pt's sister denied new SNF bed offer and stated pt will be discharged back to [facility] [facility] at time of discharge. CSW to continue to follow and assist with discharge planning needs.  Lubertha Sayres, [place] ([id number]) Licensed Clinical Social Worker Orthopedics [phone]) and Surgical [phone])

## 2014-04-07 NOTE — Progress Notes (Addendum)
Subjective: Her sister was at bedside this AM as she was eating breakfast. She had no complaints for me though does not guarantee that she will be awake the next time she sees me today.   Objective: Vital signs in last 24 hours: Filed Vitals:   04/07/14 0136 04/07/14 0525 04/07/14 0900 04/07/14 1305  BP: 122/75 106/70 147/48 145/48  Pulse: 88 92 89 88  Temp: 98.1 F (36.7 C) 97.6 F (36.4 C) 97.8 F (36.6 C) 97.8 F (36.6 C)  TempSrc: Axillary Oral Oral Oral  Resp: 17 18 18 18   Height:      Weight:  194 lb 3.2 oz (88.089 kg)    SpO2: 98% 100% 100% 100%   Weight change: 10 lb 11.2 oz (4.853 kg)  Intake/Output Summary (Last 24 hours) at 04/07/14 1310 Last data filed at 04/07/14 0941  Gross per 24 hour  Intake    240 ml  Output    800 ml  Net   -560 ml   Filed Vitals:   04/07/14 1305  BP: 145/48  Pulse: 88  Temp: 97.8 F (36.6 C)  Resp: 18   General: resting in bed, CPAP HEENT: PERRL, EOMI, no scleral icterus Cardiac: RRR, no rubs, murmurs or gallops Pulm: clear to auscultation bilaterally, no wheezes, rales, or rhonchi in the anterior fields Abd: soft, nontender, nondistended, BS present Ext: L AKA, R BKA, LUE appears flaccid Neuro: alert and oriented X3, cranial nerves II-XII grossly intact, L sided deficits 2/2 prior CVA  Lab Results: Basic Metabolic Panel:  Recent Labs Lab 04/06/14 0415 04/06/14 1828  NA 139 137  K 4.6 4.3  CL 93* 93*  CO2 26 26  GLUCOSE 102* 135*  BUN 101* 98*  CREATININE 3.56* 3.40*  CALCIUM 9.5 9.2  PHOS 6.1*  --    CBG:  Recent Labs Lab 04/05/14 0141 04/06/14 0502 04/07/14 0513  GLUCAP 126* 101* 117*   Coagulation:  Recent Labs Lab 04/05/14 0123 04/06/14 0415 04/07/14 0500  LABPROT 24.6* 22.2* 20.8*  INR 2.19* 1.92* 1.77*   Studies/Results: No results found. Medications: I have reviewed the patient's current medications. Scheduled Meds: . antiseptic oral rinse  7 mL Mouth Rinse q12n4p  . atorvastatin  20  mg Oral QHS  . carvedilol  3.125 mg Oral BID WC  . chlorhexidine  15 mL Mouth Rinse BID  . DULoxetine  20 mg Oral Daily  . feeding supplement (PRO-STAT SUGAR FREE 64)  30 mL Oral Daily  . ferrous sulfate  325 mg Oral BID  . hydrALAZINE  10 mg Oral TID  . isosorbide dinitrate  20 mg Oral TID  . levETIRAcetam  500 mg Oral BID  . sodium chloride  500 mL Intravenous Once  . sodium chloride  3 mL Intravenous Q12H  . warfarin  2.5 mg Oral ONCE-1800  . Warfarin - Pharmacist Dosing Inpatient   Does not apply q1800   Continuous Infusions:  PRN Meds:.senna-docusate Assessment/Plan: Principal Problem:   Lethargy Active Problems:   Type 2 diabetes mellitus with complication   HLD (hyperlipidemia)   GERD   Essential hypertension   DVT (deep venous thrombosis)   Chronic kidney disease, stage 4, severely decreased GFR   CHF (congestive heart failure)  Ms. Sittler is a 53 year old female with DM2, HLD, GERD, HTN, DVT, CKD Stage 3, CHF who hospitalized with lethargy possibly 2/2 AOCKD.  AOCKD Stage 3, now Stage 4: Crt 3.4 yesterday after fluid bolus, mildly improved from 3.5; BUN 101->98. Likely  2/2 diuresis in setting of low PO intake. Net -1L yesterday. Weight 194 lbs, down from 197 lbs yesterday. -Hold Lasix & metolazone again -Give 500cc bolus over 4 hours, recheck BMET, and consider another 500cc bolus based off BMET -Consult palliative care and update sister Mackie Pai as things develop -Advance diet as tolerated -Nephrology following, appreciate recs  CHF: Holding Lasix & metolazone for now.   DM2: CBGs trending low 100s. Continue monitoring.  HTN: Continue home meds.   HLD: Continue home meds.  Chronic anemia: Continue home meds.  Depression/anxiety: Continue home meds.  #FEN:  -Diet: soft  #DVT prophylaxis: Coumadin  #CODE STATUS: FULL CODE  Dispo: Disposition is deferred at this time, awaiting improvement of current medical problems.  Anticipated discharge in  approximately 1-2 day(s).   The patient does have a current PCP Rogene Houston, MD) and does not need an [facility] hospital follow-up appointment after discharge.  The patient does have transportation limitations that hinder transportation to clinic appointments.  .Services Needed at time of discharge: Y = Yes, Blank = No PT:   OT:   RN:   Equipment:   Other:     LOS: 2 days   Charlott Rakes, MD 04/07/2014, 1:10 PM

## 2014-04-08 DIAGNOSIS — I129 Hypertensive chronic kidney disease with stage 1 through stage 4 chronic kidney disease, or unspecified chronic kidney disease: Secondary | ICD-10-CM | POA: Diagnosis not present

## 2014-04-08 LAB — BASIC METABOLIC PANEL
Anion gap: 17 — ABNORMAL HIGH (ref 5–15)
BUN: 93 mg/dL — AB (ref 6–23)
CO2: 26 meq/L (ref 19–32)
Calcium: 9.3 mg/dL (ref 8.4–10.5)
Chloride: 95 mEq/L — ABNORMAL LOW (ref 96–112)
Creatinine, Ser: 3.1 mg/dL — ABNORMAL HIGH (ref 0.50–1.10)
GFR calc Af Amer: 19 mL/min — ABNORMAL LOW (ref >90)
GFR calc non Af Amer: 16 mL/min — ABNORMAL LOW (ref >90)
GLUCOSE: 90 mg/dL (ref 70–99)
POTASSIUM: 3.8 meq/L (ref 3.7–5.3)
Sodium: 138 mEq/L (ref 137–147)

## 2014-04-08 LAB — PROTIME-INR
INR: 1.75 — ABNORMAL HIGH (ref 0.00–1.49)
Prothrombin Time: 20.6 seconds — ABNORMAL HIGH (ref 11.6–15.2)

## 2014-04-08 LAB — GLUCOSE, CAPILLARY: GLUCOSE-CAPILLARY: 88 mg/dL (ref 70–99)

## 2014-04-08 MED ORDER — FUROSEMIDE 80 MG PO TABS
160.0000 mg | ORAL_TABLET | Freq: Two times a day (BID) | ORAL | Status: DC
  Administered 2014-04-09: 160 mg via ORAL
  Filled 2014-04-08: qty 4
  Filled 2014-04-08 (×2): qty 2

## 2014-04-08 MED ORDER — WARFARIN SODIUM 3 MG PO TABS
3.0000 mg | ORAL_TABLET | Freq: Once | ORAL | Status: AC
  Administered 2014-04-08: 3 mg via ORAL
  Filled 2014-04-08: qty 1

## 2014-04-08 NOTE — Progress Notes (Signed)
UR completed 

## 2014-04-08 NOTE — Progress Notes (Addendum)
Subjective: Per Social Work, her sister refused a bed offer and wanted to return to the prior SNF. The sister clarified with me that she did not want to go back but would if there was no other option.   Palliative Care also met with her and her sister to clarify their wishes, and they would like all medical interventions available trusting God's will to help them recover.   This AM, she was pleasantly sleeping and did not want to wake up for me. I revisited her a bit later when her sister was at bedside, and she was looking for other options herself as well.  Objective: Vital signs in last 24 hours: Filed Vitals:   04/08/14 0211 04/08/14 0512 04/08/14 1008 04/08/14 1316  BP: 144/51 137/54 142/48 142/49  Pulse: 94 96 93 97  Temp: 97.8 F (36.6 C) 98.3 F (36.8 C) 98 F (36.7 C) 98.3 F (36.8 C)  TempSrc:   Axillary Oral  Resp: _0 Height:      Weight:  195 lb 14.4 oz (88.86 kg)    SpO2: 95% 91% 95% 99%   Weight change: -1 lb 1.6 oz (-0.499 kg)  Intake/Output Summary (Last 24 hours) at 04/08/14 1604 Last data filed at 04/08/14 1148  Gross per 24 hour  Intake    240 ml  Output   1250 ml  Net  -1010 ml   Filed Vitals:   04/08/14 1316  BP: 142/49  Pulse: 97  Temp: 98.3 F (36.8 C)  Resp: 17   General: resting in bed, wearing CPAP mask HEENT: PERRL, EOMI, no scleral icterus Cardiac: RRR, no rubs, murmurs or gallops Pulm: clear to auscultation bilaterally, no wheezes, rales, or rhonchi in the anterior fields Abd: soft, nontender, nondistended, BS present Ext: L AKA, R BKA, LUE flaccid Neuro: alert and oriented X3, cranial nerves II-XII grossly intact, L sided deficits 2/2 prior CVA  Lab Results: Basic Metabolic Panel:  Recent Labs Lab 04/06/14 0415  04/07/14 1515 04/08/14 0704  NA 139  < > 135* 138  K 4.6  < > 3.8 3.8  CL 93*  < > 91* 95*  CO2 26  < > 26 26  GLUCOSE 102*  < > 144* 90  BUN 101*  < > 93* 93*  CREATININE 3.56*  < > 3.17* 3.10*    CALCIUM 9.5  < > 9.1 9.3  PHOS 6.1*  --   --   --   < > = values in this interval not displayed. CBG:  Recent Labs Lab 04/05/14 0141 04/06/14 0502 04/07/14 0513 04/08/14 0501  GLUCAP 126* 101* 117* 88   Coagulation:  Recent Labs Lab 04/05/14 0123 04/06/14 0415 04/07/14 0500 04/08/14 0410  LABPROT 24.6* 22.2* 20.8* 20.6*  INR 2.19* 1.92* 1.77* 1.75*   Studies/Results: No results found. Medications: I have reviewed the patient's current medications. Scheduled Meds: . antiseptic oral rinse  7 mL Mouth Rinse q12n4p  . atorvastatin  20 mg Oral QHS  . carvedilol  3.125 mg Oral BID WC  . chlorhexidine  15 mL Mouth Rinse BID  . DULoxetine  20 mg Oral Daily  . feeding supplement (PRO-STAT SUGAR FREE 64)  30 mL Oral Daily  . ferrous sulfate  325 mg Oral BID  . hydrALAZINE  10 mg Oral TID  . isosorbide dinitrate  20 mg Oral TID  . levETIRAcetam  500 mg Oral BID  . sodium chloride  3 mL Intravenous Q12H  . Warfarin -  Pharmacist Dosing Inpatient   Does not apply q1800   Continuous Infusions:  PRN Meds:.senna-docusate Assessment/Plan: Principal Problem:   Lethargy Active Problems:   Type 2 diabetes mellitus with complication   HLD (hyperlipidemia)   GERD   Essential hypertension   DVT (deep venous thrombosis)   Acute renal failure superimposed on stage 4 chronic kidney disease   CHF (congestive heart failure)   [name] is a 53 year old female with DM2, HLD, GERD, HTN, DVT, CKD Stage 3, CHF who hospitalized with lethargy possibly 2/2 AOCKD.  AOCKD Stage 4: BUN/Crt ratio improved yesterday after 500cc bolus x 2. Net -0.7L yesterday thought 1L of IV fluids was not calculated . Weight 195 lbs, minimally up from 194 lbs. PO intake has improved per her sister.  -Restart Lasix 141m BID tomorrow and increase to TID if weight increases by 2 lbs; add metolazone back if weight is up by 5 lbs. -Update sister [name]-Advance diet as tolerated -Nephrology following,  appreciate recs  CHF: Resume Lasix as noted above.   DM2: CBGs trending low 100s. Continue monitoring.  HTN: Continue home meds.   HLD: Continue home meds.  Chronic anemia: Continue home meds.  Depression/anxiety: Continue home meds.  #FEN:  -Diet: soft  #DVT prophylaxis: Coumadin  #CODE STATUS: FULL CODE  Dispo: Disposition is deferred at this time, awaiting improvement of current medical problems.  Anticipated discharge in approximately 1-2 day(s).   The patient does have a current PCP (Rogene Houston MD) and does not need an [facility]hospital follow-up appointment after discharge.  The patient does have transportation limitations that hinder transportation to clinic appointments.  .Services Needed at time of discharge: Y = Yes, Blank = No PT:   OT:   RN:   Equipment:   Other:     LOS: 3 days   RCharlott Rakes MD 04/08/2014, 4:04 PM

## 2014-04-08 NOTE — Discharge Summary (Signed)
Name: [name] MRN: [id number] DOB: Oct 18, 1960 53 y.o. PCP: Rogene Houston, MD  Date of Admission: 04/05/2014 12:31 AM Date of Discharge: 04/09/2014 Attending Physician: Sid Falcon, MD  Discharge Diagnosis: Principal Problem:   Lethargy Active Problems:   Type 2 diabetes mellitus with complication   HLD (hyperlipidemia)   GERD   Essential hypertension   History of DVT (deep vein thrombosis)   Acute renal failure superimposed on stage 4 chronic kidney disease   CHF (congestive heart failure)   DNR (do not resuscitate) discussion   Palliative care encounter   Weakness generalized  Discharge Medications:   Medication List    STOP taking these medications       HYDROmorphone 2 MG tablet  Commonly known as:  DILAUDID      TAKE these medications       acetaminophen 325 MG tablet  Commonly known as:  TYLENOL  Take 2 tablets (650 mg total) by mouth 3 (three) times daily.     allopurinol 100 MG tablet  Commonly known as:  ZYLOPRIM  Take 100 mg by mouth daily.     ALPRAZolam 0.25 MG tablet  Commonly known as:  XANAX  Take 0.25 mg by mouth every 8 (eight) hours as needed for anxiety.     atorvastatin 20 MG tablet  Commonly known as:  LIPITOR  Take 20 mg by mouth at bedtime.     carvedilol 3.125 MG tablet  Commonly known as:  COREG  Take 3.125 mg by mouth 2 (two) times daily with a meal.     DULoxetine 20 MG capsule  Commonly known as:  CYMBALTA  Take 1 capsule (20 mg total) by mouth daily.     feeding supplement (PRO-STAT SUGAR FREE 64) Liqd  Take 30 mLs by mouth daily.     feeding supplement (PRO-STAT SUGAR FREE 64) Liqd  Take 30 mLs by mouth daily.     ferrous sulfate 325 (65 FE) MG tablet  Take 325 mg by mouth 2 (two) times daily.     gi cocktail Susp suspension  Take 30 mLs by mouth 2 (two) times daily as needed for indigestion (Chest Pain). Shake well.     hydrALAZINE 10 MG tablet  Commonly known as:  APRESOLINE  Take 10 mg by mouth 3  (three) times daily.     ICY HOT 5 % Pads  Generic drug:  Menthol (Topical Analgesic)  Apply 1 patch topically daily. Apply to each stump     isosorbide dinitrate 20 MG tablet  Commonly known as:  ISORDIL  Take 20 mg by mouth 3 (three) times daily.     levETIRAcetam 500 MG tablet  Commonly known as:  KEPPRA  Take 500 mg by mouth 2 (two) times daily.     metolazone 5 MG tablet  Commonly known as:  ZAROXOLYN  Take 1 tablet (5 mg total) by mouth 2 (two) times daily.     MICONAZOLE NITRATE EX  Apply 1 application topically 2 (two) times daily.     multivitamin with minerals Tabs tablet  Take 1 tablet by mouth daily.     pregabalin 25 MG capsule  Commonly known as:  LYRICA  Take 1 capsule (25 mg total) by mouth at bedtime.     senna-docusate 8.6-50 MG per tablet  Commonly known as:  Senokot-S  Take 1 tablet by mouth at bedtime as needed for mild constipation.     tiZANidine 4 MG tablet  Commonly known as:  ZANAFLEX  Take 4 mg by mouth every 8 (eight) hours as needed for muscle spasms.     warfarin 3 MG tablet  Commonly known as:  COUMADIN  Take 1.5 mg by mouth at bedtime.      ASK your doctor about these medications       furosemide 80 MG tablet  Commonly known as:  LASIX  Take 2 tablets (160 mg total) by mouth 3 (three) times daily.        Disposition and follow-up:   [name] was discharged from [facility] in Stable condition.  At the hospital follow up visit please address:  1.  CKD & CHF: regular weight, adjustments of diuretics  2.  INR: recheck warfarin dose  3.  Labs / imaging needed at time of follow-up: BMET, INR  4.  Pending labs/ test needing follow-up: none  Follow-up Appointments: Follow-up Information   Follow up with [facility], MD In 1 week.   Specialty:  Internal Medicine   Contact information:   [place] [id number] [phone]       Discharge Instructions:   Consultations:  Treatment Team:  Palliative [facility]  Procedures Performed:  Dg Chest 2 View  04/05/2014   CLINICAL DATA:  Syncope and shortness of breath.  Initial encounter  EXAM: CHEST  2 VIEW  COMPARISON:  03/13/2014  FINDINGS: Chronic moderate cardiomegaly.  Stable upper mediastinal contours.  Low lung volumes. Increased density over the spine in the lateral projection is likely overlapping soft tissues given there is no evidence of consolidation in the frontal projection and the diaphragm remains visible. No pulmonary edema, effusion, or pneumothorax. No acute osseous findings.  IMPRESSION: Hypoaeration.  No edema or pneumonia.   Electronically Signed   By: Jorje Guild M.D.   On: 04/05/2014 02:05   Ct Head Wo Contrast  04/05/2014   CLINICAL DATA:  Found unconscious.  Initial encounter  EXAM: CT HEAD WITHOUT CONTRAST  TECHNIQUE: Contiguous axial images were obtained from the base of the skull through the vertex without intravenous contrast.  COMPARISON:  03/19/2013  FINDINGS: Skull and Sinuses:Negative for fracture or destructive process. The mastoids, middle ears, and imaged paranasal sinuses are clear.  Orbits: No acute abnormality.  Brain: No evidence of acute abnormality, such as acute infarction, hemorrhage, hydrocephalus, or mass lesion/mass effect.  Remote cortical and subcortical infarcts in the central right MCA territory and the posterior watershed region on the left. Dystrophic mineralization has developed in both infarcts since 2014. Extensive remote small vessel ischemic injury with confluent disease in the deep cerebral white matter and multiple remote bilateral lacunar infarcts in the deep gray nuclei. Generalized cerebral volume loss.  IMPRESSION: 1. No acute intracranial findings. 2. Extensive remote ischemic injury, as above.   Electronically Signed   By: Jorje Guild M.D.   On: 04/05/2014 02:44   Ir Removal Tun Cv Cath W/o Fl  03/15/2014   CLINICAL DATA:  Renal failure with previous  placement of tunneled hemodialysis catheter. The patient has recovered renal function and requires removal of the tunneled dialysis catheter.  EXAM: REMOVAL OF TUNNELED CENTRAL VENOUS CATHETER  PROCEDURE: The right chest dialysis catheter site was prepped with chlorhexidine. A sterile gown and gloves were worn during the procedure. Local anesthesia was provided with 1% lidocaine.  Utilizing manual traction, the subcutaneous cuff of the dialysis catheter was freed. The catheter was then successfully removed in its entirety. A sterile dressing was applied over the  catheter exit site.  IMPRESSION: Removal of tunneled dialysis catheter utilizing sharp and blunt dissection. The procedure was uncomplicated.   Electronically Signed   By: Aletta Edouard M.D.   On: 03/15/2014 11:50   Dg Chest Port 1 View  03/13/2014   CLINICAL DATA:  Evaluate pulmonary edema after diuresis, followup congestive heart failure  EXAM: PORTABLE CHEST - 1 VIEW  COMPARISON:  03/11/2014  FINDINGS: Very limited inspiratory effect in the patient's chin obscures the upper lung zones. No change right dual lumen central line. No change in cardiac enlargement.  Left lung appears clear. There remains mild hazy right perihilar opacity, although right-sided aeration is notably improved. Small right pleural effusions suspected.  IMPRESSION: Limited study with poor inspiratory effect. Significantly improved bilateral aeration with some mild residual right perihilar opacity suggesting mild lingering edema.   Electronically Signed   By: Skipper Cliche M.D.   On: 03/13/2014 21:19   Dg Chest Portable 1 View  03/11/2014   CLINICAL DATA:  Left-sided chest pain and difficulty breathing; hypertension  EXAM: PORTABLE CHEST - 1 VIEW  COMPARISON:  February 23, 2014  FINDINGS: There is generalized cardiomegaly with pulmonary venous hypertension. There is extensive edema diffusely. Central catheter tip is at the cavoatrial junction. No pneumothorax. No adenopathy  apparent.  IMPRESSION: Congestive heart failure, progressed from prior study. No pneumothorax.   Electronically Signed   By: Lowella Grip M.D.   On: 03/11/2014 11:28   Admission HPI: Ms. Waligorski is a 53 year old woman with history of DM2, HTN, HLD, CKD4, CVA, Anemia, Systolic CHF (Last echo Q000111Q with LV EF 35-40%), atrial fibrillation on coumadin, history of DVT, OSA presenting after being found unconscious at her nursing facility. Upon arrival of EMS, she was awake and oriented. She does not remember the event. The last thing she remembers prior to arrival is lying in bed trying to fall asleep. She feels confused right now. She reports palpitations, chest pain in her R lower chest, unchanged shortness of breath at baseline. Denies fevers/chills, lightheadedness/dizziness, abdominal pain, dysuria, paresthesias. She reports a headache that is new but cannot describe more (onset etc). She reports vomiting earlier this week but no nausea presently. She says she has diarrhea and is unable to report the last time she had it. She also reports some pain in her bilateral stumps.  She has a chronic indwelling foley.   She was recently admitted from 03/11/2014 to 03/19/2014 with acute on chronic systolic heart failure and C Diff infection. She was on oral vancomycin 125mg  QID to finish 10/16.   Hospital Course by problem list: Principal Problem:   Lethargy Active Problems:   Type 2 diabetes mellitus with complication   HLD (hyperlipidemia)   GERD   Essential hypertension   History of DVT (deep vein thrombosis)   Acute renal failure superimposed on stage 4 chronic kidney disease   CHF (congestive heart failure)   DNR (do not resuscitate) discussion   Palliative care encounter   Weakness generalized   Lethargy 2/2 acute-on-chronic kidney disease, stage 4: During her hospital stay, she was given gentle IV fluid hydration with 0.5L bolus of normal saline. Her PO intake improved and was thus thought  to be 2/2 overdiuresis. Creatinine was 3, down from 3.56 at the time of admission. Nephrology was consulted and recommended that she continue Lasix 160mg  BID unless she is not eating or drinking normally. If her weight were to increase by 2 lbs, she should increase to Lasix 160mg  TID. If her  weight were to increase by 5 lbs, she should add back her metolazone 5mg . If weight decreases by 2 lbs overnight, hold metolazone. If weight decreases by 5 lbs overnight, DECREASE to Lasix 160mg  BID  CHF: Her weight was 197 lbs during her hospital stay and was 195 lbs at the time of discharge. She was continued on home meds except Lasix and metolazone as noted above.  DM2: A1c on admission was 5.3. Her home meds were held given her low PO intake and should be revisited at follow-up appointment.  H/o DVT: Pharmacy was consulted for warfarin dosing to maintain INR 2-3.   OSA: CPAP was continued during her hospital stay.  GERD: Remained stable on home medications.  HTN: Remained stable on home medications.  HLD: Remained stable on home medications.  Discharge Vitals:   BP 148/88  Pulse 97  Temp(Src) 97.7 F (36.5 C) (Oral)  Resp 16  Ht 5' (1.524 m)  Wt 195 lb (88.451 kg)  BMI 38.08 kg/m2  SpO2 92%  Discharge Labs:  Results for orders placed during the hospital encounter of 04/05/14 (from the past 24 hour(s))  PROTIME-INR     Status: Abnormal   Collection Time    04/09/14  4:15 AM      Result Value Ref Range   Prothrombin Time 19.1 (*) 11.6 - 15.2 seconds   INR 1.59 (*) 0.00 - [id number]  BASIC METABOLIC PANEL     Status: Abnormal   Collection Time    04/09/14  4:15 AM      Result Value Ref Range   Sodium 138  137 - 147 mEq/L   Potassium 3.7  3.7 - 5.3 mEq/L   Chloride 95 (*) 96 - 112 mEq/L   CO2 26  19 - 32 mEq/L   Glucose, Bld 93  70 - 99 mg/dL   BUN 88 (*) 6 - 23 mg/dL   Creatinine, Ser 3.02 (*) 0.50 - 1.10 mg/dL   Calcium 9.8  8.4 - 10.5 mg/dL   GFR calc non Af Amer 17 (*) >90 mL/min    GFR calc Af Amer 20 (*) >90 mL/min   Anion gap 17 (*) 5 - 15  GLUCOSE, CAPILLARY     Status: None   Collection Time    04/09/14  5:32 AM      Result Value Ref Range   Glucose-Capillary 90  70 - 99 mg/dL    Signed: Charlott Rakes, MD 04/09/2014, 10:42 AM    Services Ordered on Discharge: SNF Equipment Ordered on Discharge: none

## 2014-04-08 NOTE — Progress Notes (Signed)
ANTICOAGULATION CONSULT NOTE - Follow-up  Pharmacy Consult for coumadin Indication: afib, Hx DVT/PE  Allergies  Allergen Reactions  . Ace Inhibitors Shortness Of Breath  . Latex Hives  . Lisinopril Other (See Comments)    Per MAR  . Vancomycin Other (See Comments)    Per [facility]    Patient Measurements: Height: 5' (152.4 cm) Weight: 195 lb 14.4 oz (88.86 kg) IBW/kg (Calculated) : 45.5  Weight: 112.6 kg on 03/19/14  Vital Signs: Temp: 98.3 F (36.8 C) (10/29 0512) BP: 137/54 mmHg (10/29 0512) Pulse Rate: 96 (10/29 0512)  Labs:  Recent Labs  04/05/14 1312  04/05/14 1845  04/06/14 0415 04/06/14 1828 04/07/14 0500 04/07/14 1515 04/08/14 0410  HGB  --   --   --   --  12.9  --   --   --   --   HCT  --   --   --   --  41.3  --   --   --   --   PLT  --   --   --   --  342  --   --   --   --   LABPROT  --   --   --   --  22.2*  --  20.8*  --  20.6*  INR  --   --   --   --  1.92*  --  1.77*  --  1.75*  CREATININE  --   < >  --   < > 3.56* 3.40*  --  3.17*  --   TROPONINI <0.30  --  <0.30  --   --   --   --   --   --   < > = values in this interval not displayed.  Estimated Creatinine Clearance: 20.8 ml/min (by C-G formula based on Cr of 3.17).  Medications:  Prescriptions prior to admission  Medication Sig Dispense Refill  . acetaminophen (TYLENOL) 325 MG tablet Take 2 tablets (650 mg total) by mouth 3 (three) times daily.  90 tablet  3  . allopurinol (ZYLOPRIM) 100 MG tablet Take 100 mg by mouth daily.      Marland Kitchen ALPRAZolam (XANAX) 0.25 MG tablet Take 0.25 mg by mouth every 8 (eight) hours as needed for anxiety.      . Amino Acids-Protein Hydrolys (FEEDING SUPPLEMENT, PRO-STAT SUGAR FREE 64,) LIQD Take 30 mLs by mouth daily.      Marland Kitchen atorvastatin (LIPITOR) 20 MG tablet Take 20 mg by mouth at bedtime.      . carvedilol (COREG) 3.125 MG tablet Take 3.125 mg by mouth 2 (two) times daily with a meal.      . DULoxetine (CYMBALTA) 20 MG capsule Take 1 capsule (20 mg total) by mouth  daily.  30 capsule  3  . ferrous sulfate 325 (65 FE) MG tablet Take 325 mg by mouth 2 (two) times daily.       . furosemide (LASIX) 80 MG tablet Take 2 tablets (160 mg total) by mouth 3 (three) times daily.  180 tablet  0  . hydrALAZINE (APRESOLINE) 10 MG tablet Take 10 mg by mouth 3 (three) times daily.      Marland Kitchen HYDROmorphone (DILAUDID) 2 MG tablet Take 1 tablet (2 mg total) by mouth every 4 (four) hours as needed for moderate pain or severe pain.  30 tablet  0  . isosorbide dinitrate (ISORDIL) 20 MG tablet Take 20 mg by mouth 3 (three) times daily.      Marland Kitchen  levETIRAcetam (KEPPRA) 500 MG tablet Take 500 mg by mouth 2 (two) times daily.      . Menthol, Topical Analgesic, (ICY HOT) 5 % PADS Apply 1 patch topically daily. Apply to each stump      . metolazone (ZAROXOLYN) 5 MG tablet Take 1 tablet (5 mg total) by mouth 2 (two) times daily.  60 tablet  0  . MICONAZOLE NITRATE EX Apply 1 application topically 2 (two) times daily.      . Multiple Vitamin (MULTIVITAMIN WITH MINERALS) TABS tablet Take 1 tablet by mouth daily.      . pregabalin (LYRICA) 25 MG capsule Take 1 capsule (25 mg total) by mouth at bedtime.  30 capsule  0  . tiZANidine (ZANAFLEX) 4 MG tablet Take 4 mg by mouth every 8 (eight) hours as needed for muscle spasms.      Marland Kitchen warfarin (COUMADIN) 3 MG tablet Take 1.5 mg by mouth at bedtime.      . Alum & Mag Hydroxide-Simeth (GI COCKTAIL) SUSP suspension Take 30 mLs by mouth 2 (two) times daily as needed for indigestion (Chest Pain). Shake well.  1800 mL  3  . senna-docusate (SENOKOT-S) 8.6-50 MG per tablet Take 1 tablet by mouth at bedtime as needed for mild constipation.  30 tablet  0    Assessment: 53 yo F on coumadin for afib/hx PE/DVT.  INR remains subtherapeutic. No new CBC today, no bleeding noted.    Goal of Therapy:  INR 2-3   Plan:  1. Coumadin 3mg  PO x 1 today - will give early to better assess effect on AM INR 2. Daily INR  Salome Arnt, PharmD, BCPS Pager # [phone]  04/08/2014 9:01 AM

## 2014-04-08 NOTE — Progress Notes (Signed)
  Date: 04/08/2014  Patient name: [name]  Medical record number: [id number]  Date of birth: May 08, 1961   This patient has been seen and the plan of care was discussed with the house staff. Please see their note for complete details. I concur with their findings with the following additions/corrections:  Patient reported to me that she did not want to return to previous SNF.  Sister is HCPOA I believe.  Social situation will need to be worked out in next day or 2.  Patient is improved and we will see how her renal function is doing tomorrow with her eating and no further fluids today.  If improved, will add back lasix and monitor daily weights closely.    Sid Falcon, MD 04/08/2014, 4:23 PM

## 2014-04-09 DIAGNOSIS — I129 Hypertensive chronic kidney disease with stage 1 through stage 4 chronic kidney disease, or unspecified chronic kidney disease: Secondary | ICD-10-CM | POA: Diagnosis not present

## 2014-04-09 DIAGNOSIS — Z515 Encounter for palliative care: Secondary | ICD-10-CM

## 2014-04-09 DIAGNOSIS — Z66 Do not resuscitate: Secondary | ICD-10-CM

## 2014-04-09 DIAGNOSIS — R531 Weakness: Secondary | ICD-10-CM

## 2014-04-09 DIAGNOSIS — Z7189 Other specified counseling: Secondary | ICD-10-CM

## 2014-04-09 LAB — BASIC METABOLIC PANEL
ANION GAP: 17 — AB (ref 5–15)
BUN: 88 mg/dL — ABNORMAL HIGH (ref 6–23)
CO2: 26 meq/L (ref 19–32)
Calcium: 9.8 mg/dL (ref 8.4–10.5)
Chloride: 95 mEq/L — ABNORMAL LOW (ref 96–112)
Creatinine, Ser: 3.02 mg/dL — ABNORMAL HIGH (ref 0.50–1.10)
GFR calc non Af Amer: 17 mL/min — ABNORMAL LOW (ref >90)
GFR, EST AFRICAN AMERICAN: 20 mL/min — AB (ref >90)
Glucose, Bld: 93 mg/dL (ref 70–99)
POTASSIUM: 3.7 meq/L (ref 3.7–5.3)
Sodium: 138 mEq/L (ref 137–147)

## 2014-04-09 LAB — PROTIME-INR
INR: 1.59 — ABNORMAL HIGH (ref 0.00–1.49)
Prothrombin Time: 19.1 seconds — ABNORMAL HIGH (ref 11.6–15.2)

## 2014-04-09 LAB — GLUCOSE, CAPILLARY: GLUCOSE-CAPILLARY: 90 mg/dL (ref 70–99)

## 2014-04-09 MED ORDER — PRO-STAT SUGAR FREE PO LIQD: 30.0000 mL | Freq: Every day | ORAL | Status: AC

## 2014-04-09 MED ORDER — FUROSEMIDE 80 MG PO TABS: 160.0000 mg | ORAL_TABLET | Freq: Two times a day (BID) | ORAL | Status: AC

## 2014-04-09 MED ORDER — WARFARIN SODIUM 5 MG PO TABS
5.0000 mg | ORAL_TABLET | Freq: Once | ORAL | Status: DC
  Filled 2014-04-09: qty 1

## 2014-04-09 NOTE — Progress Notes (Signed)
Subjective: Per Social Work, her sister was agreeable to a bed offer in [place]. She was at bedside with the patient, and they had no complaints.  Objective: Vital signs in last 24 hours: Filed Vitals:   04/08/14 1740 04/08/14 2107 04/08/14 2136 04/09/14 0534  BP: 138/52 154/73  148/88  Pulse: 96 93 93 97  Temp: 98.2 F (36.8 C) 97.4 F (36.3 C)  97.7 F (36.5 C)  TempSrc: Oral Oral  Oral  Resp: 18 17 16 16   Height:      Weight:    195 lb (88.451 kg)  SpO2: 98% 100% 100% 92%   Weight change: -14.4 oz (-0.408 kg)  Intake/Output Summary (Last 24 hours) at 04/09/14 0943 Last data filed at 04/09/14 0536  Gross per 24 hour  Intake    240 ml  Output   1450 ml  Net  -1210 ml   Filed Vitals:   04/09/14 0534  BP: 148/88  Pulse: 97  Temp: 97.7 F (36.5 C)  Resp: 16   General: resting in bed, wearing CPAP mask HEENT: PERRL, EOMI, no scleral icterus Cardiac: RRR, no rubs, murmurs or gallops Pulm: clear to auscultation bilaterally, no wheezes, rales, or rhonchi in the anterior fields Abd: soft, nontender, nondistended, BS present Ext: L AKA, R BKA, LUE flaccid Neuro: alert and oriented X3, cranial nerves II-XII grossly intact, L sided deficits 2/2 prior CVA  Lab Results: Basic Metabolic Panel:  Recent Labs Lab 04/06/14 0415  04/08/14 0704 04/09/14 0415  NA 139  < > 138 138  K 4.6  < > 3.8 3.7  CL 93*  < > 95* 95*  CO2 26  < > 26 26  GLUCOSE 102*  < > 90 93  BUN 101*  < > 93* 88*  CREATININE 3.56*  < > 3.10* 3.02*  CALCIUM 9.5  < > 9.3 9.8  PHOS 6.1*  --   --   --   < > = values in this interval not displayed. CBG:  Recent Labs Lab 04/05/14 0141 04/06/14 0502 04/07/14 0513 04/08/14 0501 04/09/14 0532  GLUCAP 126* 101* 117* 88 90   Coagulation:  Recent Labs Lab 04/06/14 0415 04/07/14 0500 04/08/14 0410 04/09/14 0415  LABPROT 22.2* 20.8* 20.6* 19.1*  INR 1.92* 1.77* 1.75* 1.59*   Studies/Results: No results found. Medications: I have  reviewed the patient's current medications. Scheduled Meds: . antiseptic oral rinse  7 mL Mouth Rinse q12n4p  . atorvastatin  20 mg Oral QHS  . carvedilol  3.125 mg Oral BID WC  . chlorhexidine  15 mL Mouth Rinse BID  . DULoxetine  20 mg Oral Daily  . feeding supplement (PRO-STAT SUGAR FREE 64)  30 mL Oral Daily  . ferrous sulfate  325 mg Oral BID  . furosemide  160 mg Oral BID  . hydrALAZINE  10 mg Oral TID  . isosorbide dinitrate  20 mg Oral TID  . levETIRAcetam  500 mg Oral BID  . sodium chloride  3 mL Intravenous Q12H  . warfarin  5 mg Oral Once  . Warfarin - Pharmacist Dosing Inpatient   Does not apply q1800   Continuous Infusions:  PRN Meds:.senna-docusate Assessment/Plan: Principal Problem:   Lethargy Active Problems:   Type 2 diabetes mellitus with complication   HLD (hyperlipidemia)   GERD   Essential hypertension   History of DVT (deep vein thrombosis)   Acute renal failure superimposed on stage 4 chronic kidney disease   CHF (congestive heart failure)  DNR (do not resuscitate) discussion   Palliative care encounter   Weakness generalized   Ms. Hagins is a 53 year old female with DM2, HLD, GERD, HTN, DVT, CKD Stage 3, CHF who hospitalized with lethargy possibly 2/2 AOCKD.  AOCKD Stage 4: BUN/Crt ratio appears improved from yesterday. Net -1.2L yesterday. Weight 195 lbs, stable. PO intake has improved per her sister.  -Restarted Lasix 160mg  BID today -Per Nephrology, increase Lasix to TID if weight increases by 2 lbs, add metolazone back if weight is up by 5 lbs. -Update sister Mackie Pai -Advance diet as tolerated -Nephrology following, appreciate recs  CHF: Resume Lasix as noted above.   DM2: CBGs trending low 100s. Continue monitoring.  HTN: Continue home meds.   HLD: Continue home meds.  Chronic anemia: Continue home meds.  Depression/anxiety: Continue home meds.  #FEN:  -Diet: soft  #DVT prophylaxis: Coumadin  #CODE STATUS: FULL CODE  Dispo:  Disposition is deferred at this time, awaiting improvement of current medical problems.  Anticipated discharge in approximately 1-2 day(s).   The patient does have a current PCP Rogene [place], MD) and does not need an [facility] hospital follow-up appointment after discharge.  The patient does have transportation limitations that hinder transportation to clinic appointments.  .Services Needed at time of discharge: Y = Yes, Blank = No PT:   OT:   RN:   Equipment:   Other:     LOS: 4 days   Charlott Rakes, MD 04/09/2014, 9:43 AM

## 2014-04-09 NOTE — Consult Note (Signed)
I have reviewed and discussed case with Nurse Practitioner And agree with documentation and plan as noted above   Kasra Melvin J. Hokulani Rogel D.O.  Palliative Medicine Team at   Team Phone: [phone]    

## 2014-04-09 NOTE — Progress Notes (Signed)
ANTICOAGULATION CONSULT NOTE - Follow-up  Pharmacy Consult for coumadin Indication: afib, Hx DVT/PE  Allergies  Allergen Reactions  . Ace Inhibitors Shortness Of Breath  . Latex Hives  . Lisinopril Other (See Comments)    Per MAR  . Vancomycin Other (See Comments)    Per [facility]    Patient Measurements: Height: 5' (152.4 cm) Weight: 195 lb (88.451 kg) IBW/kg (Calculated) : 45.5  Weight: 112.6 kg on 03/19/14  Vital Signs: Temp: 97.7 F (36.5 C) (10/30 0534) Temp Source: Oral (10/30 0534) BP: 148/88 mmHg (10/30 0534) Pulse Rate: 97 (10/30 0534)  Labs:  Recent Labs  04/07/14 0500 04/07/14 1515 04/08/14 0410 04/08/14 0704 04/09/14 0415  LABPROT 20.8*  --  20.6*  --  19.1*  INR 1.77*  --  1.75*  --  1.59*  CREATININE  --  3.17*  --  3.10* 3.02*    Estimated Creatinine Clearance: 21.8 ml/min (by C-G formula based on Cr of 3.02).  Medications:  Prescriptions prior to admission  Medication Sig Dispense Refill  . acetaminophen (TYLENOL) 325 MG tablet Take 2 tablets (650 mg total) by mouth 3 (three) times daily.  90 tablet  3  . allopurinol (ZYLOPRIM) 100 MG tablet Take 100 mg by mouth daily.      Marland Kitchen ALPRAZolam (XANAX) 0.25 MG tablet Take 0.25 mg by mouth every 8 (eight) hours as needed for anxiety.      . Amino Acids-Protein Hydrolys (FEEDING SUPPLEMENT, PRO-STAT SUGAR FREE 64,) LIQD Take 30 mLs by mouth daily.      Marland Kitchen atorvastatin (LIPITOR) 20 MG tablet Take 20 mg by mouth at bedtime.      . carvedilol (COREG) 3.125 MG tablet Take 3.125 mg by mouth 2 (two) times daily with a meal.      . DULoxetine (CYMBALTA) 20 MG capsule Take 1 capsule (20 mg total) by mouth daily.  30 capsule  3  . ferrous sulfate 325 (65 FE) MG tablet Take 325 mg by mouth 2 (two) times daily.       . furosemide (LASIX) 80 MG tablet Take 2 tablets (160 mg total) by mouth 3 (three) times daily.  180 tablet  0  . hydrALAZINE (APRESOLINE) 10 MG tablet Take 10 mg by mouth 3 (three) times daily.      Marland Kitchen  HYDROmorphone (DILAUDID) 2 MG tablet Take 1 tablet (2 mg total) by mouth every 4 (four) hours as needed for moderate pain or severe pain.  30 tablet  0  . isosorbide dinitrate (ISORDIL) 20 MG tablet Take 20 mg by mouth 3 (three) times daily.      Marland Kitchen levETIRAcetam (KEPPRA) 500 MG tablet Take 500 mg by mouth 2 (two) times daily.      . Menthol, Topical Analgesic, (ICY HOT) 5 % PADS Apply 1 patch topically daily. Apply to each stump      . metolazone (ZAROXOLYN) 5 MG tablet Take 1 tablet (5 mg total) by mouth 2 (two) times daily.  60 tablet  0  . MICONAZOLE NITRATE EX Apply 1 application topically 2 (two) times daily.      . Multiple Vitamin (MULTIVITAMIN WITH MINERALS) TABS tablet Take 1 tablet by mouth daily.      . pregabalin (LYRICA) 25 MG capsule Take 1 capsule (25 mg total) by mouth at bedtime.  30 capsule  0  . tiZANidine (ZANAFLEX) 4 MG tablet Take 4 mg by mouth every 8 (eight) hours as needed for muscle spasms.      Marland Kitchen  warfarin (COUMADIN) 3 MG tablet Take 1.5 mg by mouth at bedtime.      . Alum & Mag Hydroxide-Simeth (GI COCKTAIL) SUSP suspension Take 30 mLs by mouth 2 (two) times daily as needed for indigestion (Chest Pain). Shake well.  1800 mL  3  . senna-docusate (SENOKOT-S) 8.6-50 MG per tablet Take 1 tablet by mouth at bedtime as needed for mild constipation.  30 tablet  0    Assessment: 53 yo F on coumadin for afib/hx PE/DVT.  INR has decreased and remains subtherapeutic. Unclear cause for decrease since she has been receiving almost double her home dose the last few days. No new CBC today, no bleeding noted.    Goal of Therapy:  INR 2-3   Plan:  1. Coumadin 5mg  PO x 1 today - will give early to better assess effect on AM INR 2. Daily INR  Salome Arnt, PharmD, BCPS Pager # [phone] 04/09/2014 7:57 AM

## 2014-04-09 NOTE — Progress Notes (Signed)
  Date: 04/09/2014  Patient name: [name]  Medical record number: [id number]  Date of birth: 05/20/1961   This patient has been seen and the plan of care was discussed with the house staff. Please see their note for complete details. I concur with their findings with the following additions/corrections:  For discharge to SNF today.   Sid Falcon, MD 04/09/2014, 8:24 PM

## 2014-04-09 NOTE — Progress Notes (Signed)
IV removed per order. Discharged to EMS for transport back to [facility]. Prescription in packet. Melford Aase, RN

## 2014-04-09 NOTE — Clinical Social Work Note (Signed)
Pt's sister, Mackie Pai, agreeable to pt returning to [place] [facility] at time of discharge.  Pt to be discharged to [place] [facility]. Pt's sister, Mackie Pai, updated.  [place] [facility]: [id number] Transportation: EMS [facility]) scheduled for 3pm.  Lubertha Sayres, [place] ([phone]) Licensed Clinical Social Worker Orthopedics [phone]) and Surgical [phone])

## 2014-04-09 NOTE — Discharge Instructions (Signed)
Thank you for trusting [place] with your medical care!  You were hospitalized for acute-on-chronic kidney disease, stage 4. You were treated with small amounts of IV fluids.  Please resume the following diuretic regimen at the time of discharge: -Continue Lasix 160mg  BID every day UNLESS she is not eating and drinking normally -If weight increases by 2 lbs overnight, increase to Lasix 160mg  TID -If weight increases by 5 lbs overnight, increase to metolazone 5mg  -If weight decreases by 2 lbs overnight, STOP metolazone -If weight decreases by 5 lbs overnight, DECREASE to Lasix 160mg  BID

## 2014-04-10 LAB — URINE CULTURE: Colony Count: >=100000

## 2014-04-11 LAB — CULTURE, BLOOD (ROUTINE X 2)
CULTURE: NO GROWTH
Culture: NO GROWTH

## 2014-05-20 ENCOUNTER — Encounter (HOSPITAL_COMMUNITY): Payer: Self-pay | Admitting: Vascular Surgery

## 2014-06-24 ENCOUNTER — Encounter (HOSPITAL_COMMUNITY): Payer: Self-pay | Admitting: Orthopedic Surgery

## 2015-02-03 ENCOUNTER — Emergency Department (HOSPITAL_COMMUNITY): Payer: Medicare Other

## 2015-02-03 ENCOUNTER — Emergency Department (HOSPITAL_COMMUNITY)
Admission: EM | Admit: 2015-02-03 | Discharge: 2015-02-03 | Disposition: A | Discharge status: Home / Self Care | Payer: Medicare Other | Attending: Emergency Medicine | Admitting: Emergency Medicine

## 2015-02-03 ENCOUNTER — Encounter (HOSPITAL_COMMUNITY): Payer: Self-pay

## 2015-02-03 DIAGNOSIS — E780 Pure hypercholesterolemia: Secondary | ICD-10-CM | POA: Diagnosis not present

## 2015-02-03 DIAGNOSIS — Z8719 Personal history of other diseases of the digestive system: Secondary | ICD-10-CM | POA: Insufficient documentation

## 2015-02-03 DIAGNOSIS — E669 Obesity, unspecified: Secondary | ICD-10-CM | POA: Insufficient documentation

## 2015-02-03 DIAGNOSIS — N186 End stage renal disease: Secondary | ICD-10-CM | POA: Diagnosis not present

## 2015-02-03 DIAGNOSIS — R531 Weakness: Secondary | ICD-10-CM | POA: Diagnosis present

## 2015-02-03 DIAGNOSIS — Z79899 Other long term (current) drug therapy: Secondary | ICD-10-CM | POA: Diagnosis not present

## 2015-02-03 DIAGNOSIS — Z87440 Personal history of urinary (tract) infections: Secondary | ICD-10-CM | POA: Diagnosis not present

## 2015-02-03 DIAGNOSIS — Z8614 Personal history of Methicillin resistant Staphylococcus aureus infection: Secondary | ICD-10-CM | POA: Diagnosis not present

## 2015-02-03 DIAGNOSIS — F419 Anxiety disorder, unspecified: Secondary | ICD-10-CM | POA: Insufficient documentation

## 2015-02-03 DIAGNOSIS — F329 Major depressive disorder, single episode, unspecified: Secondary | ICD-10-CM | POA: Diagnosis not present

## 2015-02-03 DIAGNOSIS — R110 Nausea: Secondary | ICD-10-CM

## 2015-02-03 DIAGNOSIS — Z8669 Personal history of other diseases of the nervous system and sense organs: Secondary | ICD-10-CM | POA: Diagnosis not present

## 2015-02-03 DIAGNOSIS — Z91040 Latex allergy status: Secondary | ICD-10-CM | POA: Insufficient documentation

## 2015-02-03 DIAGNOSIS — Z8701 Personal history of pneumonia (recurrent): Secondary | ICD-10-CM | POA: Diagnosis not present

## 2015-02-03 DIAGNOSIS — R112 Nausea with vomiting, unspecified: Secondary | ICD-10-CM | POA: Diagnosis not present

## 2015-02-03 DIAGNOSIS — Z8673 Personal history of transient ischemic attack (TIA), and cerebral infarction without residual deficits: Secondary | ICD-10-CM | POA: Diagnosis not present

## 2015-02-03 DIAGNOSIS — D649 Anemia, unspecified: Secondary | ICD-10-CM | POA: Diagnosis not present

## 2015-02-03 DIAGNOSIS — Z992 Dependence on renal dialysis: Secondary | ICD-10-CM | POA: Diagnosis not present

## 2015-02-03 DIAGNOSIS — Z7901 Long term (current) use of anticoagulants: Secondary | ICD-10-CM | POA: Diagnosis not present

## 2015-02-03 DIAGNOSIS — Z8619 Personal history of other infectious and parasitic diseases: Secondary | ICD-10-CM | POA: Diagnosis not present

## 2015-02-03 DIAGNOSIS — R519 Headache, unspecified: Secondary | ICD-10-CM

## 2015-02-03 DIAGNOSIS — E119 Type 2 diabetes mellitus without complications: Secondary | ICD-10-CM | POA: Insufficient documentation

## 2015-02-03 DIAGNOSIS — I120 Hypertensive chronic kidney disease with stage 5 chronic kidney disease or end stage renal disease: Secondary | ICD-10-CM | POA: Diagnosis not present

## 2015-02-03 DIAGNOSIS — R51 Headache: Secondary | ICD-10-CM | POA: Insufficient documentation

## 2015-02-03 LAB — COMPREHENSIVE METABOLIC PANEL
ALK PHOS: 112 U/L (ref 38–126)
ALT: 23 U/L (ref 14–54)
AST: 30 U/L (ref 15–41)
Albumin: 3.5 g/dL (ref 3.5–5.0)
Anion gap: 13 (ref 5–15)
BUN: 35 mg/dL — ABNORMAL HIGH (ref 6–20)
CALCIUM: 9.4 mg/dL (ref 8.9–10.3)
CO2: 24 mmol/L (ref 22–32)
CREATININE: 4.62 mg/dL — AB (ref 0.44–1.00)
Chloride: 99 mmol/L — ABNORMAL LOW (ref 101–111)
GFR calc non Af Amer: 10 mL/min — ABNORMAL LOW (ref >60)
GFR, EST AFRICAN AMERICAN: 12 mL/min — AB (ref >60)
GLUCOSE: 86 mg/dL (ref 65–99)
Potassium: 4.1 mmol/L (ref 3.5–5.1)
SODIUM: 136 mmol/L (ref 135–145)
Total Bilirubin: 0.4 mg/dL (ref 0.3–1.2)
Total Protein: 8 g/dL (ref 6.5–8.1)

## 2015-02-03 LAB — CBC
HEMATOCRIT: 33.7 % — AB (ref 36.0–46.0)
HEMOGLOBIN: 10.9 g/dL — AB (ref 12.0–15.0)
MCH: 32.5 pg (ref 26.0–34.0)
MCHC: 32.3 g/dL (ref 30.0–36.0)
MCV: 100.6 fL — ABNORMAL HIGH (ref 78.0–100.0)
Platelets: 199 10*3/uL (ref 150–400)
RBC: 3.35 MIL/uL — AB (ref 3.87–5.11)
RDW: 13.4 % (ref 11.5–15.5)
WBC: 8.1 10*3/uL (ref 4.0–10.5)

## 2015-02-03 LAB — PROTIME-INR
INR: 1.1 (ref 0.00–1.49)
Prothrombin Time: 14.4 seconds (ref 11.6–15.2)

## 2015-02-03 MED ORDER — ONDANSETRON HCL 4 MG/2ML IJ SOLN
4.0000 mg | Freq: Once | INTRAMUSCULAR | Status: AC
  Administered 2015-02-03: 4 mg via INTRAVENOUS
  Filled 2015-02-03: qty 2

## 2015-02-03 NOTE — ED Provider Notes (Signed)
CSN: [id number]     Arrival date & time 02/03/15  1525 History   First MD Initiated Contact with Patient 02/03/15 1611     Chief Complaint  Patient presents with  . Weakness     (Consider location/radiation/quality/duration/timing/severity/associated sxs/prior Treatment) Patient is a 54 y.o. female presenting with weakness. The history is provided by the patient and the EMS personnel.  Weakness Associated symptoms include headaches. Pertinent negatives include no chest pain, no abdominal pain and no shortness of breath.  Patient w hx esrd on hd, presents from dialysis, pt states had extra dialysis session today, had full run, and that during developed nausea and vomiting. Indicates emesis clear, not bloody or bilious. Denies abd pain or distension. Had normal bm earlier today. Pt also notes dull, diffuse headache, moderate, gradual onset earlier today. No head injury or fall. No syncope. Denies neck pain or stiffness. No sinus drainage or pain. Denies any change in speech or vision. No new numbness or weakness. Pt denies fever or chills. Denies recent change in meds. Is on coumadin, denies abn bleeding or bruising.   On arrival to ED, pt indicates is hungry for jello.     Past Medical History  Diagnosis Date  . HTN (hypertension)   . HLD (hyperlipidemia)   . History of methicillin resistant staphylococcus aureus (MRSA)   . CVA (cerebral infarction)   . Anemia   . Tachycardia   . Obesity   . Lower back pain     Chronic  . Idiopathic peripheral neuropathy     NOS  . DJD (degenerative joint disease)   . Headache(784.0)   . GERD (gastroesophageal reflux disease)   . Depression   . Anxiety   . Hypokalemia     resolved  . Cerebral infarct     left parietal and bilateral  . Esophagitis     distal  . Fluid overload     compensated  . Urinary retention   . H/O: pneumonia   . History of bacteremia   . Surgery, other elective     of the ear  . Stroke     Hx of left  frontoparietal stroke in 12/11. History of right brain stroke.  . Cardiomyopathy     Echocardiogram 04/23/11: Mild LVH, EF 30-35%, mild MR, mild LAE, mild-moderate RVE.  TEE 04/25/11: Moderate LVH, severe biventricular dysfunction, EF 20-25%, moderate to severe MR, moderate TR, biatrial enlargement, no PFO, negative bubble study  . Shortness of breath     with exertion   . Sleep apnea     no machine used  pt does not remember test for sleep apnea  . DM type 2 (diabetes mellitus, type 2)     type two   oral meds only   . 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 UTI (lower urinary tract infection) 10/19/11    had approx April 2013  . Osteomyelitis   . DJD (degenerative joint disease)   . Urinary retention   . Pneumonia   . Peripheral vascular disease    Past Surgical History  Procedure Laterality Date  . Tympanoplasty    . Toe amputation      left 2nd toe  . Dilation and curettage of uterus      history  . Tee without cardioversion  04/25/2011    Procedure: TRANSESOPHAGEAL ECHOCARDIOGRAM (TEE);  Surgeon: Jolaine Artist, MD;  Location: [facility] ENDOSCOPY;  Service: Cardiovascular;  Laterality: N/A;  .  Esophagogastroduodenoscopy  08/14/2011    Procedure: ESOPHAGOGASTRODUODENOSCOPY (EGD);  Surgeon: Scarlette Shorts, MD;  Location: [facility] ENDOSCOPY;  Service: Endoscopy;  Laterality: N/A;  . Amputation  01/18/2012    Procedure: AMPUTATION BELOW KNEE;  Surgeon: Newt Minion, MD;  Location: [place];  Service: Orthopedics;  Laterality: Left;  Left Below Knee Amputation  . Amputation Right 08/01/2012    Procedure: Right Below Knee Amputation;  Surgeon: Newt Minion, MD;  Location: [place];  Service: Orthopedics;  Laterality: Right;  Right Below Knee Amputation  . Pars plana vitrectomy Left 01/28/2013    Procedure: PARS PLANA VITRECTOMY WITH 23 GAUGE;  Surgeon: Adonis Brook, MD;  Location: [place];  Service: Ophthalmology;  Laterality: Left;  . Photocoagulation with laser Left 01/28/2013    Procedure:  PHOTOCOAGULATION WITH LASER;  Surgeon: Adonis Brook, MD;  Location: [place];  Service: Ophthalmology;  Laterality: Left;  ENDOLASER  . Membrane peel Left 01/28/2013    Procedure: MEMBRANE PEEL;  Surgeon: Adonis Brook, MD;  Location: [place];  Service: Ophthalmology;  Laterality: Left;  . Amputation Left 03/18/2013    Procedure: AMPUTATION ABOVE KNEE;  Surgeon: Newt Minion, MD;  Location: [place];  Service: Orthopedics;  Laterality: Left;  . Abdominal aortagram N/A 10/18/2011    Procedure: ABDOMINAL Maxcine Ham;  Surgeon: Conrad [place], MD;  Location: [facility] CATH LAB;  Service: Cardiovascular;  Laterality: N/A;  . Lower extremity angiogram Bilateral 10/18/2011    Procedure: LOWER EXTREMITY ANGIOGRAM;  Surgeon: Conrad , MD;  Location: [facility] CATH LAB;  Service: Cardiovascular;  Laterality: Bilateral;   Family History  Problem Relation Age of Onset  . Coronary artery disease    . Diabetes    . Cancer Mother   . Diabetes Mother   . Heart disease Father   . Hyperlipidemia Father   . Hypertension Father   . Stroke Father   . Heart disease Brother   . Anesthesia problems Neg Hx   . Hypotension Neg Hx   . Malignant hyperthermia Neg Hx   . Pseudochol deficiency Neg Hx    Social History  Substance Use Topics  . Smoking status: Never Smoker   . Smokeless tobacco: Never Used  . Alcohol Use: Yes     Comment: rare on holidays   OB History    No data available     Review of Systems  Constitutional: Negative for fever and chills.  HENT: Negative for sore throat.   Eyes: Negative for pain and visual disturbance.  Respiratory: Negative for cough and shortness of breath.   Cardiovascular: Negative for chest pain.  Gastrointestinal: Positive for nausea and vomiting. Negative for abdominal pain, diarrhea and constipation.  Genitourinary: Negative for dysuria and flank pain.  Musculoskeletal: Negative for back pain, neck pain and neck stiffness.  Skin: Negative for rash.  Neurological: Positive for weakness  and headaches. Negative for numbness.  Hematological: Does not bruise/bleed easily.  Psychiatric/Behavioral: Negative for confusion.      Allergies  Ace inhibitors; Latex; Lisinopril; and Vancomycin  Home Medications   Prior to Admission medications   Medication Sig Start Date End Date Taking? Authorizing Provider  acetaminophen (TYLENOL) 325 MG tablet Take 2 tablets (650 mg total) by mouth 3 (three) times daily. 03/19/14   Karlene Einstein, MD  allopurinol (ZYLOPRIM) 100 MG tablet Take 100 mg by mouth daily.    Historical Provider, MD  ALPRAZolam Duanne Moron) 0.25 MG tablet Take 0.25 mg by mouth every 8 (eight) hours as needed for anxiety.  Historical Provider, MD  Alum & Mag Hydroxide-Simeth (GI COCKTAIL) SUSP suspension Take 30 mLs by mouth 2 (two) times daily as needed for indigestion (Chest Pain). Shake well. 03/19/14   Karlene Einstein, MD  Amino Acids-Protein Hydrolys (FEEDING SUPPLEMENT, PRO-STAT SUGAR FREE 64,) LIQD Take 30 mLs by mouth daily.    Historical Provider, MD  Amino Acids-Protein Hydrolys (FEEDING SUPPLEMENT, PRO-STAT SUGAR FREE 64,) LIQD Take 30 mLs by mouth daily. 04/09/14   Rushil Sherrye Payor, MD  atorvastatin (LIPITOR) 20 MG tablet Take 20 mg by mouth at bedtime.    Historical Provider, MD  carvedilol (COREG) 3.125 MG tablet Take 3.125 mg by mouth 2 (two) times daily with a meal.    Historical Provider, MD  DULoxetine (CYMBALTA) 20 MG capsule Take 1 capsule (20 mg total) by mouth daily. 03/19/14   Karlene Einstein, MD  ferrous sulfate 325 (65 FE) MG tablet Take 325 mg by mouth 2 (two) times daily.     Historical Provider, MD  furosemide (LASIX) 80 MG tablet Take 2 tablets (160 mg total) by mouth 2 (two) times daily. 04/09/14   Riccardo Dubin, MD  hydrALAZINE (APRESOLINE) 10 MG tablet Take 10 mg by mouth 3 (three) times daily.    Historical Provider, MD  isosorbide dinitrate (ISORDIL) 20 MG tablet Take 20 mg by mouth 3 (three) times daily.    Historical Provider, MD   levETIRAcetam (KEPPRA) 500 MG tablet Take 500 mg by mouth 2 (two) times daily.    Historical Provider, MD  Menthol, Topical Analgesic, (ICY HOT) 5 % PADS Apply 1 patch topically daily. Apply to each stump    Historical Provider, MD  metolazone (ZAROXOLYN) 5 MG tablet Take 1 tablet (5 mg total) by mouth 2 (two) times daily. 03/19/14   Karlene Einstein, MD  MICONAZOLE NITRATE EX Apply 1 application topically 2 (two) times daily.    Historical Provider, MD  Multiple Vitamin (MULTIVITAMIN WITH MINERALS) TABS tablet Take 1 tablet by mouth daily.    Historical Provider, MD  pregabalin (LYRICA) 25 MG capsule Take 1 capsule (25 mg total) by mouth at bedtime. 03/19/14   Karlene Einstein, MD  senna-docusate (SENOKOT-S) 8.6-50 MG per tablet Take 1 tablet by mouth at bedtime as needed for mild constipation. 03/19/14   Karlene Einstein, MD  tiZANidine (ZANAFLEX) 4 MG tablet Take 4 mg by mouth every 8 (eight) hours as needed for muscle spasms.    Historical Provider, MD  warfarin (COUMADIN) 3 MG tablet Take 1.5 mg by mouth at bedtime.    Historical Provider, MD   BP 138/38 mmHg  Pulse 93  Temp(Src) 98.1 F (36.7 C) (Rectal)  Resp 10  SpO2 100% Physical Exam  Constitutional: She is oriented to person, place, and time. She appears well-developed and well-nourished. No distress.  HENT:  Head: Atraumatic.  Nose: Nose normal.  Mouth/Throat: Oropharynx is clear and moist.  No sinus or temporal tenderness.  Eyes: Conjunctivae and EOM are normal. Pupils are equal, round, and reactive to light. No scleral icterus.  Neck: Normal range of motion. Neck supple. No tracheal deviation present. No thyromegaly present.  No stiffness or rigidity.   Cardiovascular: Normal rate, regular rhythm, normal heart sounds and intact distal pulses.  Exam reveals no gallop and no friction rub.   No murmur heard. Pulmonary/Chest: Effort normal and breath sounds normal. No respiratory distress.  Left chest hd cath without sign of  infection.   Abdominal: Soft. Normal appearance and bowel sounds  are normal. She exhibits no distension and no mass. There is no tenderness. There is no rebound and no guarding.  Genitourinary:  No cva tenderness.  Musculoskeletal: Normal range of motion. She exhibits no edema or tenderness.  Right bka, left aka. No ext edema.   Neurological: She is alert and oriented to person, place, and time. No cranial nerve deficit.  Speech clear/fluent. Prior cva w left sided weakness, unchanged per pt.    Skin: Skin is warm and dry. No rash noted. She is not diaphoretic.  Psychiatric: She has a normal mood and affect.  Nursing note and vitals reviewed.   ED Course  Procedures (including critical care time) Labs Review  Results for orders placed or performed during the hospital encounter of 02/03/15  CBC  Result Value Ref Range   WBC 8.1 4.0 - 10.5 K/uL   RBC 3.35 (L) 3.87 - 5.11 MIL/uL   Hemoglobin 10.9 (L) 12.0 - 15.0 g/dL   HCT 33.7 (L) 36.0 - 46.0 %   MCV 100.6 (H) 78.0 - 100.0 fL   MCH 32.5 26.0 - 34.0 pg   MCHC 32.3 30.0 - 36.0 g/dL   RDW 13.4 11.5 - 15.5 %   Platelets 199 150 - 400 K/uL  Comprehensive metabolic panel  Result Value Ref Range   Sodium 136 135 - 145 mmol/L   Potassium 4.1 3.5 - 5.1 mmol/L   Chloride 99 (L) 101 - 111 mmol/L   CO2 24 22 - 32 mmol/L   Glucose, Bld 86 65 - 99 mg/dL   BUN 35 (H) 6 - 20 mg/dL   Creatinine, Ser 4.62 (H) 0.44 - 1.00 mg/dL   Calcium 9.4 8.9 - 10.3 mg/dL   Total Protein 8.0 6.5 - 8.1 g/dL   Albumin 3.5 3.5 - 5.0 g/dL   AST 30 15 - 41 U/L   ALT 23 14 - 54 U/L   Alkaline Phosphatase 112 38 - 126 U/L   Total Bilirubin 0.4 0.3 - 1.2 mg/dL   GFR calc non Af Amer 10 (L) >60 mL/min   GFR calc Af Amer 12 (L) >60 mL/min   Anion gap 13 5 - 15  Protime-INR  Result Value Ref Range   Prothrombin Time 14.4 11.6 - 15.2 seconds   INR 1.10 0.00 - 1.49   Ct Head Wo Contrast  02/03/2015   CLINICAL DATA:  Weakness.  Seizure.  Prior strokes.   Anxiety.  EXAM: CT HEAD WITHOUT CONTRAST  TECHNIQUE: Contiguous axial images were obtained from the base of the skull through the vertex without intravenous contrast.  COMPARISON:  05/21/2014  FINDINGS: Chronic encephalomalacia in the right MCA and left posterior watershed distribution. This is similar to the prior exam.  Small remote lacunar infarcts along the upper margin of the left lentiform nucleus.  Periventricular white matter and corona radiata hypodensities favor chronic ischemic microvascular white matter disease. No intracranial hemorrhage, mass lesion, or acute CVA.  Chronic left maxillary sinusitis. Periapical lucency along a left maxillary premolar, only partially seen. Calvarial sclerosis diffusely, stable.  There is atherosclerotic calcification of the cavernous carotid arteries bilaterally. Partially empty sella.  IMPRESSION: 1. No acute intracranial findings. 2. Chronic encephalomalacia in the right MCA and left posterior watershed distributions. Old left lentiform nucleus lacunar infarcts. 3. Periventricular white matter and corona radiata hypodensities favor chronic ischemic microvascular white matter disease. 4. Chronic left maxillary sinusitis. 5. Periapical lucency along a left maxillary premolar, only partially included. 6. Diffuse calvarial sclerosis, probably from renal osteodystrophy.  7. Partially empty sella. 8. Atherosclerosis.   Electronically Signed   By: Van Clines M.D.   On: 02/03/2015 18:38      I have personally reviewed and evaluated these images and lab results as part of my medical decision-making.   EKG Interpretation   Date/Time:  Thursday February 03 2015 15:49:20 EDT Ventricular Rate:  91 PR Interval:  163 QRS Duration: 120 QT Interval:  397 QTC Calculation: 488 R Axis:   -38 Text Interpretation:  Sinus rhythm Nonspecific IVCD with LAD Left  ventricular hypertrophy Nonspecific T wave abnormality Confirmed by Ashok Cordia   MD, Lennette Bihari ([id number]) on 02/03/2015  4:10:07 PM      MDM   Iv ns. zofran iv.  Reviewed nursing notes and prior charts for additional history.   Recheck, tolerating po fluids well. No nv. abd soft nt. Afeb.   Headache resolved.  ?whether given extra day dialysis today, too much fluid taken off.  Pt remains symptomatically improved, no new c/o, and currently appears stable for d/c.       Lajean Saver, MD 02/03/15 [phone]

## 2015-02-03 NOTE — ED Notes (Signed)
[facility] has been called @ 2032

## 2015-02-03 NOTE — ED Notes (Signed)
EMS- pt coming from dialysis and reporting weakness. Per staff pt is very clammy, unable to get a manual BP per staff. Pt a&o X4. Multiple deficits due to previous strokes. Pt reports chest pain that started yesterday.

## 2015-02-03 NOTE — Discharge Instructions (Signed)
It was our pleasure to provide your ER care today - we hope that you feel better.  Rest. Drink adequate fluids.  Follow up with your doctor in the next few days.  Return to ER if worse, new symptoms, fevers, trouble breathing, persistent vomiting, other concern.    Nausea and Vomiting Nausea is a sick feeling that often comes before throwing up (vomiting). Vomiting is a reflex where stomach contents come out of your mouth. Vomiting can cause severe loss of body fluids (dehydration). Children and elderly adults can become dehydrated quickly, especially if they also have diarrhea. Nausea and vomiting are symptoms of a condition or disease. It is important to find the cause of your symptoms. CAUSES   Direct irritation of the stomach lining. This irritation can result from increased acid production (gastroesophageal reflux disease), infection, food poisoning, taking certain medicines (such as nonsteroidal anti-inflammatory drugs), alcohol use, or tobacco use.  Signals from the brain.These signals could be caused by a headache, heat exposure, an inner ear disturbance, increased pressure in the brain from injury, infection, a tumor, or a concussion, pain, emotional stimulus, or metabolic problems.  An obstruction in the gastrointestinal tract (bowel obstruction).  Illnesses such as diabetes, hepatitis, gallbladder problems, appendicitis, kidney problems, cancer, sepsis, atypical symptoms of a heart attack, or eating disorders.  Medical treatments such as chemotherapy and radiation.  Receiving medicine that makes you sleep (general anesthetic) during surgery. DIAGNOSIS Your caregiver may ask for tests to be done if the problems do not improve after a few days. Tests may also be done if symptoms are severe or if the reason for the nausea and vomiting is not clear. Tests may include:  Urine tests.  Blood tests.  Stool tests.  Cultures (to look for evidence of infection).  X-rays or other  imaging studies. Test results can help your caregiver make decisions about treatment or the need for additional tests. TREATMENT You need to stay well hydrated. Drink frequently but in small amounts.You may wish to drink water, sports drinks, clear broth, or eat frozen ice pops or gelatin dessert to help stay hydrated.When you eat, eating slowly may help prevent nausea.There are also some antinausea medicines that may help prevent nausea. HOME CARE INSTRUCTIONS   Take all medicine as directed by your caregiver.  If you do not have an appetite, do not force yourself to eat. However, you must continue to drink fluids.  If you have an appetite, eat a normal diet unless your caregiver tells you differently.  Eat a variety of complex carbohydrates (rice, wheat, potatoes, bread), lean meats, yogurt, fruits, and vegetables.  Avoid high-fat foods because they are more difficult to digest.  Drink enough water and fluids to keep your urine clear or pale yellow.  If you are dehydrated, ask your caregiver for specific rehydration instructions. Signs of dehydration may include:  Severe thirst.  Dry lips and mouth.  Dizziness.  Dark urine.  Decreasing urine frequency and amount.  Confusion.  Rapid breathing or pulse. SEEK IMMEDIATE MEDICAL CARE IF:   You have blood or brown flecks (like coffee grounds) in your vomit.  You have black or bloody stools.  You have a severe headache or stiff neck.  You are confused.  You have severe abdominal pain.  You have chest pain or trouble breathing.  You do not urinate at least once every 8 hours.  You develop cold or clammy skin.  You continue to vomit for longer than 24 to 48 hours.  You  have a fever. MAKE SURE YOU:   Understand these instructions.  Will watch your condition.  Will get help right away if you are not doing well or get worse. Document Released: 05/28/2005 Document Revised: 08/20/2011 Document Reviewed:  10/25/2010 [facility] Patient Information 2015 [place], [place]. This information is not intended to replace advice given to you by your health care provider. Make sure you discuss any questions you have with your health care provider.    Weakness Weakness is a lack of strength. It may be felt all over the body (generalized) or in one specific part of the body (focal). Some causes of weakness can be serious. You may need further medical evaluation, especially if you are elderly or you have a history of immunosuppression (such as chemotherapy or HIV), kidney disease, heart disease, or diabetes. CAUSES  Weakness can be caused by many different things, including:  Infection.  Physical exhaustion.  Internal bleeding or other blood loss that results in a lack of red blood cells (anemia).  Dehydration. This cause is more common in elderly people.  Side effects or electrolyte abnormalities from medicines, such as pain medicines or sedatives.  Emotional distress, anxiety, or depression.  Circulation problems, especially severe peripheral arterial disease.  Heart disease, such as rapid atrial fibrillation, bradycardia, or heart failure.  Nervous system disorders, such as Guillain-Barr syndrome, multiple sclerosis, or stroke. DIAGNOSIS  To find the cause of your weakness, your caregiver will take your history and perform a physical exam. Lab tests or X-rays may also be ordered, if needed. TREATMENT  Treatment of weakness depends on the cause of your symptoms and can vary greatly. HOME CARE INSTRUCTIONS   Rest as needed.  Eat a well-balanced diet.  Try to get some exercise every day.  Only take over-the-counter or prescription medicines as directed by your caregiver. SEEK MEDICAL CARE IF:   Your weakness seems to be getting worse or spreads to other parts of your body.  You develop new aches or pains. SEEK IMMEDIATE MEDICAL CARE IF:   You cannot perform your normal daily activities,  such as getting dressed and feeding yourself.  You cannot walk up and down stairs, or you feel exhausted when you do so.  You have shortness of breath or chest pain.  You have difficulty moving parts of your body.  You have weakness in only one area of the body or on only one side of the body.  You have a fever.  You have trouble speaking or swallowing.  You cannot control your bladder or bowel movements.  You have black or bloody vomit or stools. MAKE SURE YOU:  Understand these instructions.  Will watch your condition.  Will get help right away if you are not doing well or get worse. Document Released: 05/28/2005 Document Revised: 11/27/2011 Document Reviewed: 07/27/2011 [facility] Patient Information 2015 [place], [place]. This information is not intended to replace advice given to you by your health care provider. Make sure you discuss any questions you have with your health care provider.   Dialysis Dialysis is a procedure that replaces some of the work healthy kidneys do. It is done when you lose about 85-90% of your kidney function. It may also be done earlier if your symptoms may be improved by dialysis. During dialysis, wastes, salt, and extra water are removed from the blood, and the levels of certain chemicals in the blood (such as potassium) are maintained. Dialysis is done in sessions. Dialysis sessions are continued until the kidneys get better.  If the kidneys cannot get better, such as in end-stage kidney disease, dialysis is continued for life or until you receive a new kidney (kidney transplant). There are two types of dialysis: hemodialysis and peritoneal dialysis. WHAT IS HEMODIALYSIS?  Hemodialysis is a type of dialysis in which a machine called a dialyzer is used to filter the blood. Before beginning hemodialysis, you will have surgery to create a site where blood can be removed from the body and returned to the body (vascular access). There are three types of  vascular accesses:  Arteriovenous fistula. To create this type of access, an artery is connected to a vein (usually in the arm). A fistula takes 1-6 months to develop after surgery. If it develops properly, it usually lasts longer than the other types of vascular accesses. It is also less likely to become infected and cause blood clots.  Arteriovenous graft. To create this type of access, an artery and a vein in the arm are connected with a tube. A graft may be used within 2-3 weeks of surgery.  A venous catheter. To create this type of access, a thin, flexible tube (catheter) is placed in a large vein in your neck, chest, or groin. A catheter may be used right away. It is usually used as a temporary access when dialysis needs to begin immediately. During hemodialysis, blood leaves the body through your access. It travels through a tube to the dialyzer, where it is filtered. The blood then returns to your body through another tube. Hemodialysis is usually performed by a health care provider at a hospital or dialysis center three times a week. Visits last about 3-4 hours. It may also be performed with the help of another person at home with training.  WHAT IS PERITONEAL DIALYSIS? Peritoneal dialysis is a type of dialysis in which the thin lining of the abdomen (peritoneum) is used as a filter. Before beginning peritoneal dialysis, you will have surgery to place a catheter in your abdomen. The catheter will be used to transfer a fluid called dialysate to and from your abdomen. At the start of a session, your abdomen is filled with dialysate. During the session, wastes, salt, and extra water in the blood pass through the peritoneum and into the dialysate. The dialysate is drained from the body at the end of the session. The process of filling and draining the dialysate is called an exchange. Exchanges are repeated until you have used up all the dialysate for the day. Peritoneal dialysis may be performed by  you at home or at almost any other location. It is done every day. You may need up to five exchanges a day. The amount of time the dialysate is in your body between exchanges is called a dwell. The dwell depends on the number of exchanges needed and the characteristics of the peritoneum. It usually varies from 1.5-3 hours. You may go about your day normally between exchanges. Alternately, the exchanges may be done at night while you sleep, using a machine called a cycler. WHICH TYPE OF DIALYSIS SHOULD I CHOOSE?  Both hemodialysis and peritoneal dialysis have advantages and disadvantages. Talk to your health care provider about which type of dialysis would be best for you. Your lifestyle and preferences should be considered along with your medical condition. In some cases, only one type of dialysis may be an option.  Advantages of hemodialysis  It is done less often than peritoneal dialysis.  Someone else can do the dialysis for you.  If  you go to a dialysis center, your health care provider will be able to recognize any problems right away.  If you go to a dialysis center, you can interact with others who are having dialysis. This can provide you with emotional support. Disadvantages of hemodialysis  Hemodialysis may cause cramps and low blood pressure. It may leave you feeling tired on the days you have the treatment.  If you go to a dialysis center, you will need to make weekly appointments and work around the center's schedule.  You will need to take extra care when traveling. If you go to a dialysis center, you will need to make special arrangements to visit a dialysis center near your destination. If you are having treatments at home, you will need to take the dialyzer with you to your destination.  You will need to avoid more foods than you would need to avoid on peritoneal dialysis. Advantages of peritoneal dialysis  It is less likely than hemodialysis to cause cramps and low blood  pressure.  You may do exchanges on your own wherever you are, including when you travel.  You do not need to avoid as many foods as you do on hemodialysis. Disadvantages of peritoneal dialysis  It is done more often than hemodialysis.  Performing peritoneal dialysis requires you to have dexterity of the hands. You must also be able to lift bags.  You will have to learn sterilization techniques. You will need to practice them every day to reduce the risk of infection. WHAT CHANGES WILL I NEED TO MAKE TO MY DIET DURING DIALYSIS? Both hemodialysis and peritoneal dialysis require you to make some changes to your diet. For example, you will need to limit your intake of foods high in the minerals phosphorus and potassium. You will also need to limit your fluid intake. Your dietitian can help you plan meals. A good meal plan can improve your dialysis and your health.  WHAT SHOULD I EXPECT WHEN BEGINNING DIALYSIS? Adjusting to the dialysis treatment, schedule, and diet can take some time. You may need to stop working and may not be able to do some of the things you normally do. You may feel anxious or depressed when beginning dialysis. Eventually, many people feel better overall because of dialysis. Some people are able to return to work after making some changes, such as reducing work intensity. WHERE CAN I FIND MORE INFORMATION?   [place]: www.kidney.org  [employer] Kidney Patients: [employer]  [employer]: www.kidneyfund.org Document Released: 08/18/2002 Document Revised: 10/12/2013 Document Reviewed: 07/22/2012 [facility] Patient Information 2015 [place], [place]. This information is not intended to replace advice given to you by your health care provider. Make sure you discuss any questions you have with your health care provider.

## 2015-02-03 NOTE — ED Notes (Signed)
Pt fed jello. No difficulties swallowing and no c/o nausea.

## 2015-08-08 ENCOUNTER — Emergency Department (HOSPITAL_COMMUNITY)
Admission: EM | Admit: 2015-08-08 | Discharge: 2015-08-08 | Disposition: A | Discharge status: Home / Self Care | Payer: Medicare Other | Attending: Emergency Medicine | Admitting: Emergency Medicine

## 2015-08-08 ENCOUNTER — Other Ambulatory Visit: Payer: Self-pay

## 2015-08-08 ENCOUNTER — Encounter (HOSPITAL_COMMUNITY): Payer: Self-pay | Admitting: Emergency Medicine

## 2015-08-08 DIAGNOSIS — Z87440 Personal history of urinary (tract) infections: Secondary | ICD-10-CM | POA: Insufficient documentation

## 2015-08-08 DIAGNOSIS — Z79899 Other long term (current) drug therapy: Secondary | ICD-10-CM | POA: Diagnosis not present

## 2015-08-08 DIAGNOSIS — D649 Anemia, unspecified: Secondary | ICD-10-CM | POA: Diagnosis not present

## 2015-08-08 DIAGNOSIS — E876 Hypokalemia: Secondary | ICD-10-CM | POA: Diagnosis not present

## 2015-08-08 DIAGNOSIS — K219 Gastro-esophageal reflux disease without esophagitis: Secondary | ICD-10-CM | POA: Diagnosis not present

## 2015-08-08 DIAGNOSIS — F329 Major depressive disorder, single episode, unspecified: Secondary | ICD-10-CM | POA: Insufficient documentation

## 2015-08-08 DIAGNOSIS — Z7901 Long term (current) use of anticoagulants: Secondary | ICD-10-CM | POA: Insufficient documentation

## 2015-08-08 DIAGNOSIS — E785 Hyperlipidemia, unspecified: Secondary | ICD-10-CM | POA: Insufficient documentation

## 2015-08-08 DIAGNOSIS — R0789 Other chest pain: Secondary | ICD-10-CM | POA: Diagnosis not present

## 2015-08-08 DIAGNOSIS — I10 Essential (primary) hypertension: Secondary | ICD-10-CM | POA: Diagnosis not present

## 2015-08-08 DIAGNOSIS — Z8614 Personal history of Methicillin resistant Staphylococcus aureus infection: Secondary | ICD-10-CM | POA: Diagnosis not present

## 2015-08-08 DIAGNOSIS — F419 Anxiety disorder, unspecified: Secondary | ICD-10-CM | POA: Insufficient documentation

## 2015-08-08 DIAGNOSIS — Z8701 Personal history of pneumonia (recurrent): Secondary | ICD-10-CM | POA: Insufficient documentation

## 2015-08-08 DIAGNOSIS — Z91040 Latex allergy status: Secondary | ICD-10-CM | POA: Insufficient documentation

## 2015-08-08 DIAGNOSIS — E119 Type 2 diabetes mellitus without complications: Secondary | ICD-10-CM | POA: Diagnosis not present

## 2015-08-08 DIAGNOSIS — R079 Chest pain, unspecified: Secondary | ICD-10-CM | POA: Diagnosis present

## 2015-08-08 DIAGNOSIS — Z8673 Personal history of transient ischemic attack (TIA), and cerebral infarction without residual deficits: Secondary | ICD-10-CM | POA: Insufficient documentation

## 2015-08-08 LAB — BASIC METABOLIC PANEL
Anion gap: 13 (ref 5–15)
BUN: 24 mg/dL — AB (ref 6–20)
CALCIUM: 8 mg/dL — AB (ref 8.9–10.3)
CO2: 26 mmol/L (ref 22–32)
Chloride: 101 mmol/L (ref 101–111)
Creatinine, Ser: 4.33 mg/dL — ABNORMAL HIGH (ref 0.44–1.00)
GFR calc Af Amer: 13 mL/min — ABNORMAL LOW (ref >60)
GFR, EST NON AFRICAN AMERICAN: 11 mL/min — AB (ref >60)
GLUCOSE: 105 mg/dL — AB (ref 65–99)
Potassium: 4.5 mmol/L (ref 3.5–5.1)
Sodium: 140 mmol/L (ref 135–145)

## 2015-08-08 LAB — CBC
HEMATOCRIT: 31.3 % — AB (ref 36.0–46.0)
HEMOGLOBIN: 9.9 g/dL — AB (ref 12.0–15.0)
MCH: 31.8 pg (ref 26.0–34.0)
MCHC: 31.6 g/dL (ref 30.0–36.0)
MCV: 100.6 fL — ABNORMAL HIGH (ref 78.0–100.0)
Platelets: 217 10*3/uL (ref 150–400)
RBC: 3.11 MIL/uL — AB (ref 3.87–5.11)
RDW: 16.3 % — ABNORMAL HIGH (ref 11.5–15.5)
WBC: 7.7 10*3/uL (ref 4.0–10.5)

## 2015-08-08 LAB — I-STAT TROPONIN, ED: TROPONIN I, POC: 0.01 ng/mL (ref 0.00–0.08)

## 2015-08-08 NOTE — ED Notes (Signed)
Assisted feeding pt sandwich and drink.

## 2015-08-08 NOTE — ED Notes (Signed)
Gave pt [place] sandwich and Diet Sprite, per Melissa - RN.

## 2015-08-08 NOTE — ED Notes (Signed)
[facility] from dialysis for chest pressure. NO SOB or CP at this time. Completed 2 out 3 hours of dialysis

## 2015-08-08 NOTE — ED Provider Notes (Signed)
CSN: [id number]     Arrival date & time 08/08/15  1037 History   First MD Initiated Contact with Patient 08/08/15 1108     Chief Complaint  Patient presents with  . Chest Pain     (Consider location/radiation/quality/duration/timing/severity/associated sxs/prior Treatment) Patient is a 55 y.o. female presenting with chest pain.  Chest Pain Chest pain location: over port. Pain quality: pressure   Pain radiates to:  Does not radiate Pain radiates to the back: no   Pain severity:  Mild Onset quality:  Gradual Duration: during dialysis tretment. Timing: daily, every time she gets dialysis. Progression:  Resolved Chronicity:  Recurrent Context comment:  During dialysis treatments, anxiety Relieved by:  Rest (pain medicine) Worsened by:  Nothing tried Ineffective treatments:  None tried Risk factors: diabetes mellitus, high cholesterol, hypertension and obesity   Risk factors: no prior DVT/PE and no smoking   Risk factors comment:  ESRD on dialysis MWF   Past Medical History  Diagnosis Date  . HTN (hypertension)   . HLD (hyperlipidemia)   . History of methicillin resistant staphylococcus aureus (MRSA)   . CVA (cerebral infarction)   . Anemia   . Tachycardia   . Obesity   . Lower back pain     Chronic  . Idiopathic peripheral neuropathy (HCC)     NOS  . DJD (degenerative joint disease)   . Headache(784.0)   . GERD (gastroesophageal reflux disease)   . Depression   . Anxiety   . Hypokalemia     resolved  . Cerebral infarct (HCC)     left parietal and bilateral  . Esophagitis     distal  . Fluid overload     compensated  . Urinary retention   . H/O: pneumonia   . History of bacteremia   . Surgery, other elective     of the ear  . Stroke [facility])     Hx of left frontoparietal stroke in 12/11. History of right brain stroke.  . Cardiomyopathy     Echocardiogram 04/23/11: Mild LVH, EF 30-35%, mild MR, mild LAE, mild-moderate RVE.  TEE 04/25/11: Moderate LVH, severe  biventricular dysfunction, EF 20-25%, moderate to severe MR, moderate TR, biatrial enlargement, no PFO, negative bubble study  . Shortness of breath     with exertion   . Sleep apnea     no machine used  pt does not remember test for sleep apnea  . DM type 2 (diabetes mellitus, type 2) ([place])     type two   oral meds only   . 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 UTI (lower urinary tract infection) 10/19/11    had approx April 2013  . Osteomyelitis ([place])   . DJD (degenerative joint disease)   . Urinary retention   . Pneumonia   . Peripheral vascular disease [facility])    Past Surgical History  Procedure Laterality Date  . Tympanoplasty    . Toe amputation      left 2nd toe  . Dilation and curettage of uterus      history  . Tee without cardioversion  04/25/2011    Procedure: TRANSESOPHAGEAL ECHOCARDIOGRAM (TEE);  Surgeon: Jolaine Artist, MD;  Location: [facility] ENDOSCOPY;  Service: Cardiovascular;  Laterality: N/A;  . Esophagogastroduodenoscopy  08/14/2011    Procedure: ESOPHAGOGASTRODUODENOSCOPY (EGD);  Surgeon: Scarlette Shorts, MD;  Location: [facility] ENDOSCOPY;  Service: Endoscopy;  Laterality: N/A;  . Amputation  01/18/2012    Procedure: AMPUTATION  BELOW KNEE;  Surgeon: Newt Minion, MD;  Location: [place];  Service: Orthopedics;  Laterality: Left;  Left Below Knee Amputation  . Amputation Right 08/01/2012    Procedure: Right Below Knee Amputation;  Surgeon: Newt Minion, MD;  Location: [place];  Service: Orthopedics;  Laterality: Right;  Right Below Knee Amputation  . Pars plana vitrectomy Left 01/28/2013    Procedure: PARS PLANA VITRECTOMY WITH 23 GAUGE;  Surgeon: Adonis Brook, MD;  Location: [place];  Service: Ophthalmology;  Laterality: Left;  . Photocoagulation with laser Left 01/28/2013    Procedure: PHOTOCOAGULATION WITH LASER;  Surgeon: Adonis Brook, MD;  Location: [place];  Service: Ophthalmology;  Laterality: Left;  ENDOLASER  . Membrane peel Left 01/28/2013    Procedure: MEMBRANE PEEL;   Surgeon: Adonis Brook, MD;  Location: [place];  Service: Ophthalmology;  Laterality: Left;  . Amputation Left 03/18/2013    Procedure: AMPUTATION ABOVE KNEE;  Surgeon: Newt Minion, MD;  Location: [place];  Service: Orthopedics;  Laterality: Left;  . Abdominal aortagram N/A 10/18/2011    Procedure: ABDOMINAL Maxcine Ham;  Surgeon: Conrad [place], MD;  Location: [facility] CATH LAB;  Service: Cardiovascular;  Laterality: N/A;  . Lower extremity angiogram Bilateral 10/18/2011    Procedure: LOWER EXTREMITY ANGIOGRAM;  Surgeon: Conrad [place], MD;  Location: [facility] CATH LAB;  Service: Cardiovascular;  Laterality: Bilateral;   Family History  Problem Relation Age of Onset  . Coronary artery disease    . Diabetes    . Cancer Mother   . Diabetes Mother   . Heart disease Father   . Hyperlipidemia Father   . Hypertension Father   . Stroke Father   . Heart disease Brother   . Anesthesia problems Neg Hx   . Hypotension Neg Hx   . Malignant hyperthermia Neg Hx   . Pseudochol deficiency Neg Hx    Social History  Substance Use Topics  . Smoking status: Never Smoker   . Smokeless tobacco: Never Used  . Alcohol Use: Yes     Comment: rare on holidays   OB History    No data available     Review of Systems  Cardiovascular: Positive for chest pain.  All other systems reviewed and are negative.     Allergies  Ace inhibitors; Latex; Lisinopril; and Vancomycin  Home Medications   Prior to Admission medications   Medication Sig Start Date End Date Taking? Authorizing Provider  acetaminophen (TYLENOL) 325 MG tablet Take 2 tablets (650 mg total) by mouth 3 (three) times daily. 03/19/14   Karlene Einstein, MD  allopurinol (ZYLOPRIM) 100 MG tablet Take 100 mg by mouth daily.    Historical Provider, MD  ALPRAZolam Duanne Moron) 0.25 MG tablet Take 0.25 mg by mouth every 8 (eight) hours as needed for anxiety.    Historical Provider, MD  Alum & Mag Hydroxide-Simeth (GI COCKTAIL) SUSP suspension Take 30 mLs by mouth 2 (two)  times daily as needed for indigestion (Chest Pain). Shake well. 03/19/14   Karlene Einstein, MD  Amino Acids-Protein Hydrolys (FEEDING SUPPLEMENT, PRO-STAT SUGAR FREE 64,) LIQD Take 30 mLs by mouth daily.    Historical Provider, MD  Amino Acids-Protein Hydrolys (FEEDING SUPPLEMENT, PRO-STAT SUGAR FREE 64,) LIQD Take 30 mLs by mouth daily. 04/09/14   Rushil Sherrye Payor, MD  atorvastatin (LIPITOR) 20 MG tablet Take 20 mg by mouth at bedtime.    Historical Provider, MD  carvedilol (COREG) 3.125 MG tablet Take 3.125 mg by mouth 2 (two)  times daily with a meal.    Historical Provider, MD  DULoxetine (CYMBALTA) 20 MG capsule Take 1 capsule (20 mg total) by mouth daily. 03/19/14   Karlene Einstein, MD  ferrous sulfate 325 (65 FE) MG tablet Take 325 mg by mouth 2 (two) times daily.     Historical Provider, MD  furosemide (LASIX) 80 MG tablet Take 2 tablets (160 mg total) by mouth 2 (two) times daily. 04/09/14   Riccardo Dubin, MD  hydrALAZINE (APRESOLINE) 10 MG tablet Take 10 mg by mouth 3 (three) times daily.    Historical Provider, MD  isosorbide dinitrate (ISORDIL) 20 MG tablet Take 20 mg by mouth 3 (three) times daily.    Historical Provider, MD  levETIRAcetam (KEPPRA) 500 MG tablet Take 500 mg by mouth 2 (two) times daily.    Historical Provider, MD  Menthol, Topical Analgesic, (ICY HOT) 5 % PADS Apply 1 patch topically daily. Apply to each stump    Historical Provider, MD  metolazone (ZAROXOLYN) 5 MG tablet Take 1 tablet (5 mg total) by mouth 2 (two) times daily. 03/19/14   Karlene Einstein, MD  MICONAZOLE NITRATE EX Apply 1 application topically 2 (two) times daily.    Historical Provider, MD  Multiple Vitamin (MULTIVITAMIN WITH MINERALS) TABS tablet Take 1 tablet by mouth daily.    Historical Provider, MD  pregabalin (LYRICA) 25 MG capsule Take 1 capsule (25 mg total) by mouth at bedtime. 03/19/14   Karlene Einstein, MD  senna-docusate (SENOKOT-S) 8.6-50 MG per tablet Take 1 tablet by mouth at bedtime as needed  for mild constipation. 03/19/14   Karlene Einstein, MD  tiZANidine (ZANAFLEX) 4 MG tablet Take 4 mg by mouth every 8 (eight) hours as needed for muscle spasms.    Historical Provider, MD  warfarin (COUMADIN) 3 MG tablet Take 1.5 mg by mouth at bedtime.    Historical Provider, MD   BP 144/80 mmHg  Pulse 94  Temp(Src) 98.1 F (36.7 C) (Oral)  Resp 12  SpO2 100% Physical Exam  Constitutional: She is oriented to person, place, and time. She appears well-developed and well-nourished. No distress.  HENT:  Head: Normocephalic.  Eyes: Conjunctivae are normal.  Neck: Neck supple. No tracheal deviation present.  Cardiovascular: Normal rate, regular rhythm and normal heart sounds.   Pulmonary/Chest: Effort normal and breath sounds normal. No respiratory distress. She exhibits tenderness (over left anteriorly).    Abdominal: Soft. She exhibits no distension. There is no tenderness. There is no rebound and no guarding.  Neurological: She is alert and oriented to person, place, and time.  Skin: Skin is warm and dry.  Psychiatric: She has a normal mood and affect.  Vitals reviewed.   ED Course  Procedures (including critical care time) Labs Review Labs Reviewed  BASIC METABOLIC PANEL - Abnormal; Notable for the following:    Glucose, Bld 105 (*)    BUN 24 (*)    Creatinine, Ser 4.33 (*)    Calcium 8.0 (*)    GFR calc non Af Amer 11 (*)    GFR calc Af Amer 13 (*)    All other components within normal limits  CBC - Abnormal; Notable for the following:    RBC 3.11 (*)    Hemoglobin 9.9 (*)    HCT 31.3 (*)    MCV 100.6 (*)    RDW 16.3 (*)    All other components within normal limits  I-STAT TROPOININ, ED    Imaging Review No  results found. I have personally reviewed and evaluated these images and lab results as part of my medical decision-making.   EKG Interpretation   Date/Time:  Monday August 08 2015 10:34:38 EST Ventricular Rate:  95 PR Interval:  178 QRS Duration: 109 QT  Interval:  407 QTC Calculation: 512 R Axis:   -55 Text Interpretation:  Sinus rhythm Left anterior fascicular block  Nonspecific T wave abnormality unchanged from previous Confirmed by Saliou Barnier  MD, Radha Coggins [id number]) on 08/08/2015 11:18:17 AM      MDM   Final diagnoses:  Chest wall pain    55 y.o. female presents with Recurrent chest pressure that she states occurs every time she has dialysis. She says she gets anxious about it and notices pain over her report on the left. Pain is reproducible on palpation here. Highly atypical. No history of coronary artery disease despite also risk factors. Troponin is negative. EKG is unchanged from prior. At this time the patient appears to be having pain related to her chest wall as opposed to her heart or thromboembolic event. She is at her baseline asking for food and water. She is pain-free since leaving dialysis. I recommended she follow up in 2 days for her regularly scheduled dialysis session, return with any recurrence of pain or worsening symptoms.    Leo Grosser, MD 08/08/15 [phone]

## 2015-08-08 NOTE — Discharge Instructions (Signed)

## 2016-03-21 IMAGING — CR DG CHEST 1V PORT
2 series · 2 of 2 positions shown · non-contrast
Comparison: February 23, 2014

CLINICAL DATA: Left-sided chest pain and difficulty breathing;
hypertension

EXAM:
PORTABLE CHEST - 1 VIEW

[AP (1 of 2)]
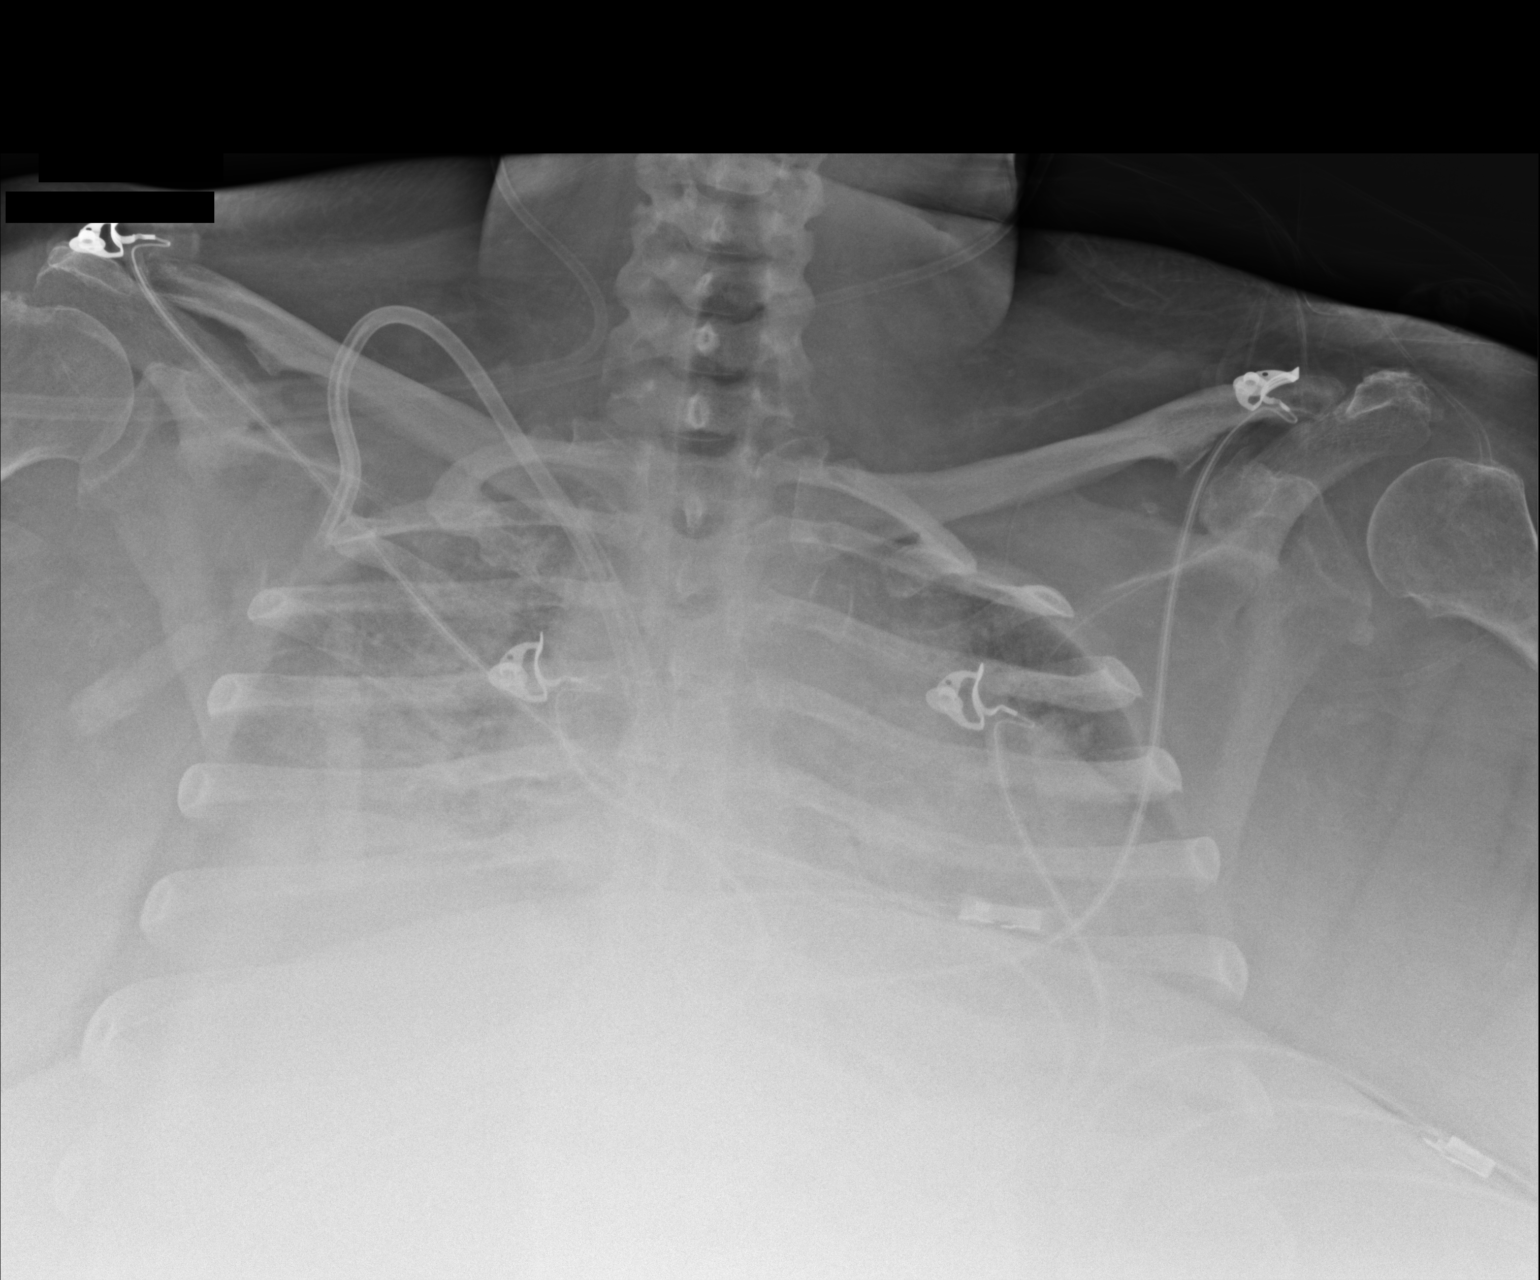

[AP (2 of 2)]
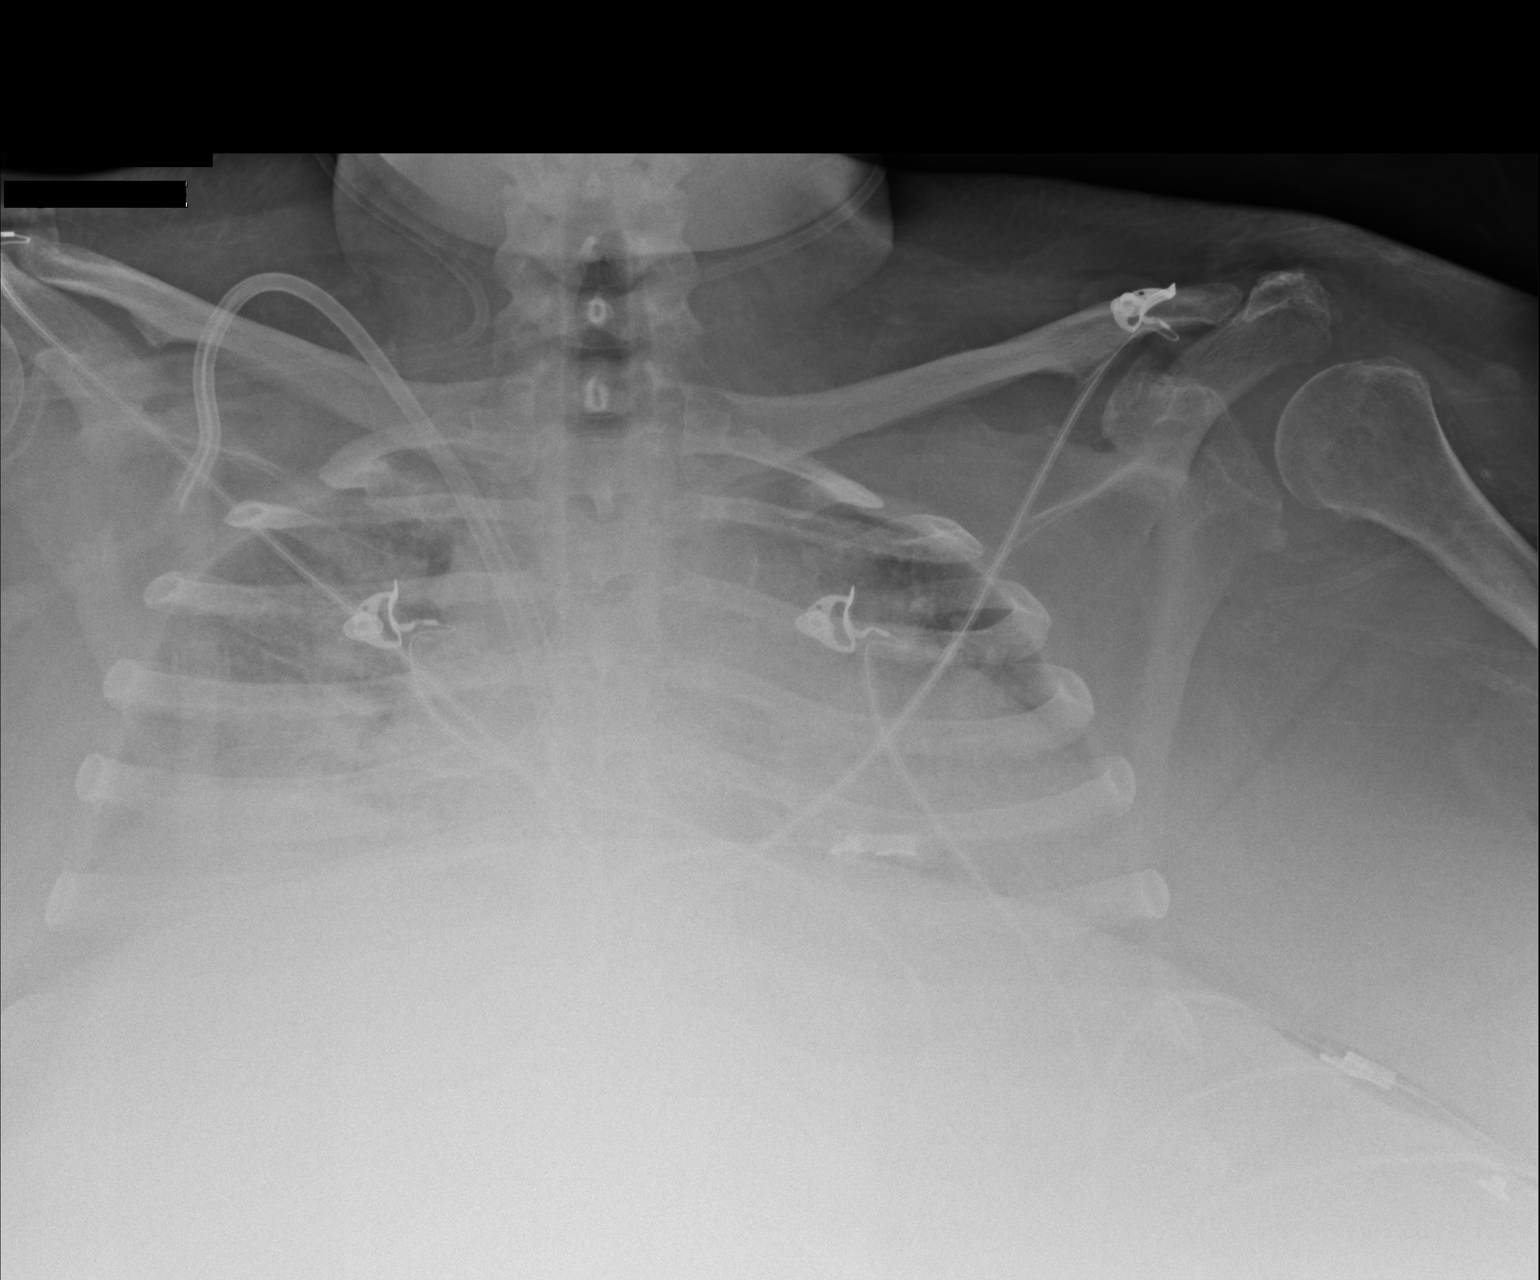

[2 of 2 positions shown; findings below may reference images not displayed]

FINDINGS: There is generalized cardiomegaly with pulmonary venous
hypertension. There is extensive edema diffusely. Central catheter
tip is at the cavoatrial junction. No pneumothorax. No adenopathy
apparent.
IMPRESSION: Congestive heart failure, progressed from prior study. No
pneumothorax.

## 2016-03-23 IMAGING — CR DG CHEST 1V PORT
2 series · 2 of 2 positions shown · non-contrast
Comparison: 03/11/2014

CLINICAL DATA: Evaluate pulmonary edema after diuresis, followup
congestive heart failure

EXAM:
PORTABLE CHEST - 1 VIEW

[AP (1 of 2)]
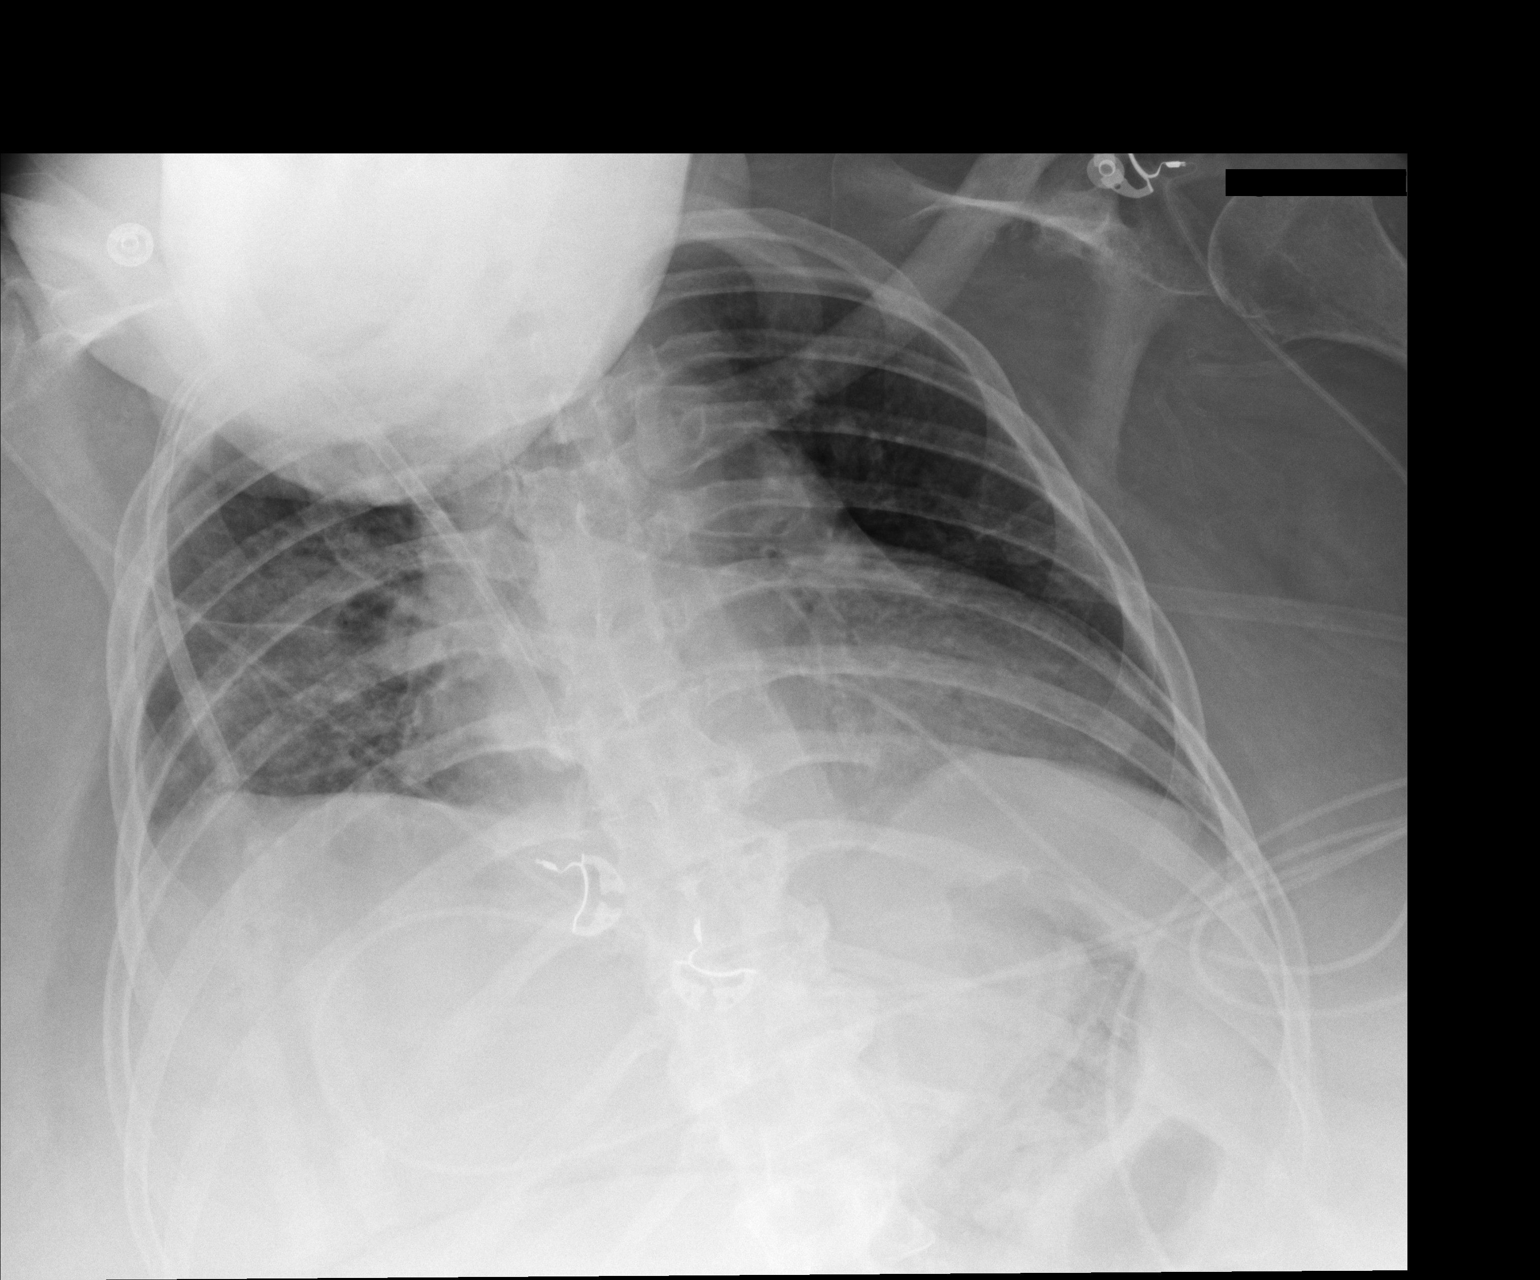

[AP (2 of 2)]
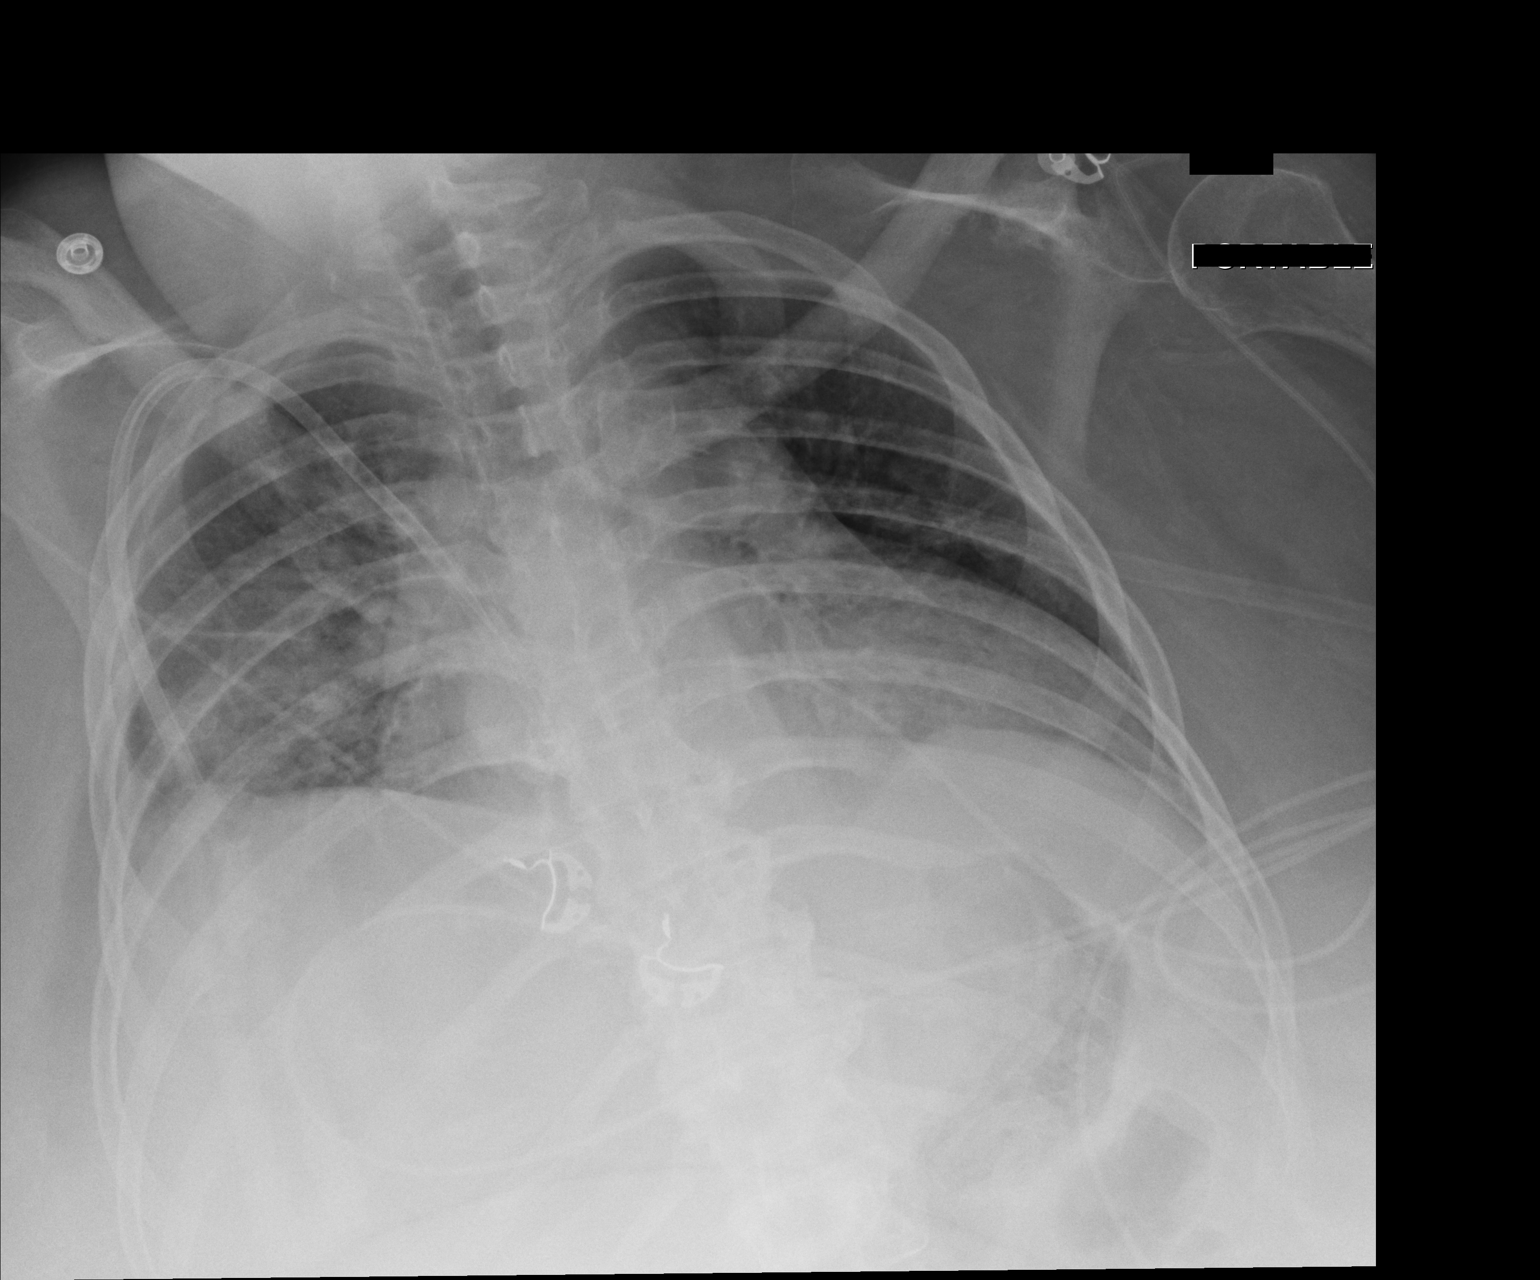

[2 of 2 positions shown; findings below may reference images not displayed]

FINDINGS: Very limited inspiratory effect in the patient's chin obscures the
upper lung zones. No change right dual lumen central line. No change
in cardiac enlargement.

Left lung appears clear. There remains mild hazy right perihilar
opacity, although right-sided aeration is notably improved. Small
right pleural effusions suspected.
IMPRESSION: Limited study with poor inspiratory effect. Significantly improved
bilateral aeration with some mild residual right perihilar opacity
suggesting mild lingering edema.

## 2016-06-11 DIAGNOSIS — N186 End stage renal disease: Secondary | ICD-10-CM | POA: Diagnosis not present

## 2016-06-11 DIAGNOSIS — D631 Anemia in chronic kidney disease: Secondary | ICD-10-CM | POA: Diagnosis not present

## 2016-06-11 DIAGNOSIS — I10 Essential (primary) hypertension: Secondary | ICD-10-CM | POA: Diagnosis not present

## 2016-06-12 DIAGNOSIS — F329 Major depressive disorder, single episode, unspecified: Secondary | ICD-10-CM | POA: Diagnosis not present

## 2016-06-12 DIAGNOSIS — F411 Generalized anxiety disorder: Secondary | ICD-10-CM | POA: Diagnosis not present

## 2016-06-12 DIAGNOSIS — G4700 Insomnia, unspecified: Secondary | ICD-10-CM | POA: Diagnosis not present

## 2016-06-13 DIAGNOSIS — Z7401 Bed confinement status: Secondary | ICD-10-CM | POA: Diagnosis not present

## 2016-06-13 DIAGNOSIS — Z9981 Dependence on supplemental oxygen: Secondary | ICD-10-CM | POA: Diagnosis not present

## 2016-06-13 DIAGNOSIS — D631 Anemia in chronic kidney disease: Secondary | ICD-10-CM | POA: Diagnosis not present

## 2016-06-13 DIAGNOSIS — N2581 Secondary hyperparathyroidism of renal origin: Secondary | ICD-10-CM | POA: Diagnosis not present

## 2016-06-13 DIAGNOSIS — Z418 Encounter for other procedures for purposes other than remedying health state: Secondary | ICD-10-CM | POA: Diagnosis not present

## 2016-06-13 DIAGNOSIS — N186 End stage renal disease: Secondary | ICD-10-CM | POA: Diagnosis not present

## 2016-06-13 DIAGNOSIS — K769 Liver disease, unspecified: Secondary | ICD-10-CM | POA: Diagnosis not present

## 2016-06-13 DIAGNOSIS — I10 Essential (primary) hypertension: Secondary | ICD-10-CM | POA: Diagnosis not present

## 2016-06-13 DIAGNOSIS — M2450 Contracture, unspecified joint: Secondary | ICD-10-CM | POA: Diagnosis not present

## 2016-06-13 DIAGNOSIS — E1139 Type 2 diabetes mellitus with other diabetic ophthalmic complication: Secondary | ICD-10-CM | POA: Diagnosis not present

## 2016-06-13 DIAGNOSIS — Z23 Encounter for immunization: Secondary | ICD-10-CM | POA: Diagnosis not present

## 2016-06-13 DIAGNOSIS — M109 Gout, unspecified: Secondary | ICD-10-CM | POA: Diagnosis not present

## 2016-06-13 DIAGNOSIS — E213 Hyperparathyroidism, unspecified: Secondary | ICD-10-CM | POA: Diagnosis not present

## 2016-06-15 DIAGNOSIS — N2581 Secondary hyperparathyroidism of renal origin: Secondary | ICD-10-CM | POA: Diagnosis not present

## 2016-06-15 DIAGNOSIS — E213 Hyperparathyroidism, unspecified: Secondary | ICD-10-CM | POA: Diagnosis not present

## 2016-06-15 DIAGNOSIS — Z7401 Bed confinement status: Secondary | ICD-10-CM | POA: Diagnosis not present

## 2016-06-15 DIAGNOSIS — Z418 Encounter for other procedures for purposes other than remedying health state: Secondary | ICD-10-CM | POA: Diagnosis not present

## 2016-06-15 DIAGNOSIS — Z9981 Dependence on supplemental oxygen: Secondary | ICD-10-CM | POA: Diagnosis not present

## 2016-06-15 DIAGNOSIS — N186 End stage renal disease: Secondary | ICD-10-CM | POA: Diagnosis not present

## 2016-06-15 DIAGNOSIS — Z23 Encounter for immunization: Secondary | ICD-10-CM | POA: Diagnosis not present

## 2016-06-15 DIAGNOSIS — D631 Anemia in chronic kidney disease: Secondary | ICD-10-CM | POA: Diagnosis not present

## 2016-06-18 DIAGNOSIS — E213 Hyperparathyroidism, unspecified: Secondary | ICD-10-CM | POA: Diagnosis not present

## 2016-06-18 DIAGNOSIS — M2450 Contracture, unspecified joint: Secondary | ICD-10-CM | POA: Diagnosis not present

## 2016-06-18 DIAGNOSIS — Z418 Encounter for other procedures for purposes other than remedying health state: Secondary | ICD-10-CM | POA: Diagnosis not present

## 2016-06-18 DIAGNOSIS — N2581 Secondary hyperparathyroidism of renal origin: Secondary | ICD-10-CM | POA: Diagnosis not present

## 2016-06-18 DIAGNOSIS — Z23 Encounter for immunization: Secondary | ICD-10-CM | POA: Diagnosis not present

## 2016-06-18 DIAGNOSIS — Z7401 Bed confinement status: Secondary | ICD-10-CM | POA: Diagnosis not present

## 2016-06-18 DIAGNOSIS — Z9981 Dependence on supplemental oxygen: Secondary | ICD-10-CM | POA: Diagnosis not present

## 2016-06-18 DIAGNOSIS — D631 Anemia in chronic kidney disease: Secondary | ICD-10-CM | POA: Diagnosis not present

## 2016-06-18 DIAGNOSIS — N186 End stage renal disease: Secondary | ICD-10-CM | POA: Diagnosis not present

## 2016-06-19 DIAGNOSIS — H25813 Combined forms of age-related cataract, bilateral: Secondary | ICD-10-CM | POA: Diagnosis not present

## 2016-06-19 DIAGNOSIS — H04123 Dry eye syndrome of bilateral lacrimal glands: Secondary | ICD-10-CM | POA: Diagnosis not present

## 2016-06-19 DIAGNOSIS — E113593 Type 2 diabetes mellitus with proliferative diabetic retinopathy without macular edema, bilateral: Secondary | ICD-10-CM | POA: Diagnosis not present

## 2016-06-20 DIAGNOSIS — N186 End stage renal disease: Secondary | ICD-10-CM | POA: Diagnosis not present

## 2016-06-20 DIAGNOSIS — Z418 Encounter for other procedures for purposes other than remedying health state: Secondary | ICD-10-CM | POA: Diagnosis not present

## 2016-06-20 DIAGNOSIS — Z9981 Dependence on supplemental oxygen: Secondary | ICD-10-CM | POA: Diagnosis not present

## 2016-06-20 DIAGNOSIS — N2581 Secondary hyperparathyroidism of renal origin: Secondary | ICD-10-CM | POA: Diagnosis not present

## 2016-06-20 DIAGNOSIS — D631 Anemia in chronic kidney disease: Secondary | ICD-10-CM | POA: Diagnosis not present

## 2016-06-20 DIAGNOSIS — Z23 Encounter for immunization: Secondary | ICD-10-CM | POA: Diagnosis not present

## 2016-06-20 DIAGNOSIS — Z7401 Bed confinement status: Secondary | ICD-10-CM | POA: Diagnosis not present

## 2016-06-20 DIAGNOSIS — E213 Hyperparathyroidism, unspecified: Secondary | ICD-10-CM | POA: Diagnosis not present

## 2016-06-21 DIAGNOSIS — I10 Essential (primary) hypertension: Secondary | ICD-10-CM | POA: Diagnosis not present

## 2016-06-21 DIAGNOSIS — D649 Anemia, unspecified: Secondary | ICD-10-CM | POA: Diagnosis not present

## 2016-06-21 DIAGNOSIS — E119 Type 2 diabetes mellitus without complications: Secondary | ICD-10-CM | POA: Diagnosis not present

## 2016-06-21 DIAGNOSIS — M150 Primary generalized (osteo)arthritis: Secondary | ICD-10-CM | POA: Diagnosis not present

## 2016-06-22 DIAGNOSIS — Z7401 Bed confinement status: Secondary | ICD-10-CM | POA: Diagnosis not present

## 2016-06-22 DIAGNOSIS — Z418 Encounter for other procedures for purposes other than remedying health state: Secondary | ICD-10-CM | POA: Diagnosis not present

## 2016-06-22 DIAGNOSIS — N2581 Secondary hyperparathyroidism of renal origin: Secondary | ICD-10-CM | POA: Diagnosis not present

## 2016-06-22 DIAGNOSIS — D631 Anemia in chronic kidney disease: Secondary | ICD-10-CM | POA: Diagnosis not present

## 2016-06-22 DIAGNOSIS — N186 End stage renal disease: Secondary | ICD-10-CM | POA: Diagnosis not present

## 2016-06-22 DIAGNOSIS — E213 Hyperparathyroidism, unspecified: Secondary | ICD-10-CM | POA: Diagnosis not present

## 2016-06-22 DIAGNOSIS — Z23 Encounter for immunization: Secondary | ICD-10-CM | POA: Diagnosis not present

## 2016-06-22 DIAGNOSIS — Z9981 Dependence on supplemental oxygen: Secondary | ICD-10-CM | POA: Diagnosis not present

## 2016-06-24 DIAGNOSIS — I5022 Chronic systolic (congestive) heart failure: Secondary | ICD-10-CM | POA: Diagnosis not present

## 2016-06-24 DIAGNOSIS — I10 Essential (primary) hypertension: Secondary | ICD-10-CM | POA: Diagnosis not present

## 2016-06-24 DIAGNOSIS — I69351 Hemiplegia and hemiparesis following cerebral infarction affecting right dominant side: Secondary | ICD-10-CM | POA: Diagnosis not present

## 2016-06-24 DIAGNOSIS — M6281 Muscle weakness (generalized): Secondary | ICD-10-CM | POA: Diagnosis not present

## 2016-06-25 DIAGNOSIS — E213 Hyperparathyroidism, unspecified: Secondary | ICD-10-CM | POA: Diagnosis not present

## 2016-06-25 DIAGNOSIS — Z23 Encounter for immunization: Secondary | ICD-10-CM | POA: Diagnosis not present

## 2016-06-25 DIAGNOSIS — Z418 Encounter for other procedures for purposes other than remedying health state: Secondary | ICD-10-CM | POA: Diagnosis not present

## 2016-06-25 DIAGNOSIS — N2581 Secondary hyperparathyroidism of renal origin: Secondary | ICD-10-CM | POA: Diagnosis not present

## 2016-06-25 DIAGNOSIS — D631 Anemia in chronic kidney disease: Secondary | ICD-10-CM | POA: Diagnosis not present

## 2016-06-25 DIAGNOSIS — N186 End stage renal disease: Secondary | ICD-10-CM | POA: Diagnosis not present

## 2016-06-26 DIAGNOSIS — I5022 Chronic systolic (congestive) heart failure: Secondary | ICD-10-CM | POA: Diagnosis not present

## 2016-06-26 DIAGNOSIS — I10 Essential (primary) hypertension: Secondary | ICD-10-CM | POA: Diagnosis not present

## 2016-06-26 DIAGNOSIS — E1122 Type 2 diabetes mellitus with diabetic chronic kidney disease: Secondary | ICD-10-CM | POA: Diagnosis not present

## 2016-06-26 DIAGNOSIS — R079 Chest pain, unspecified: Secondary | ICD-10-CM | POA: Diagnosis not present

## 2016-06-27 DIAGNOSIS — Z418 Encounter for other procedures for purposes other than remedying health state: Secondary | ICD-10-CM | POA: Diagnosis not present

## 2016-06-27 DIAGNOSIS — Z23 Encounter for immunization: Secondary | ICD-10-CM | POA: Diagnosis not present

## 2016-06-27 DIAGNOSIS — E213 Hyperparathyroidism, unspecified: Secondary | ICD-10-CM | POA: Diagnosis not present

## 2016-06-27 DIAGNOSIS — D631 Anemia in chronic kidney disease: Secondary | ICD-10-CM | POA: Diagnosis not present

## 2016-06-27 DIAGNOSIS — N186 End stage renal disease: Secondary | ICD-10-CM | POA: Diagnosis not present

## 2016-06-27 DIAGNOSIS — N2581 Secondary hyperparathyroidism of renal origin: Secondary | ICD-10-CM | POA: Diagnosis not present

## 2016-06-29 DIAGNOSIS — N186 End stage renal disease: Secondary | ICD-10-CM | POA: Diagnosis not present

## 2016-06-29 DIAGNOSIS — Z418 Encounter for other procedures for purposes other than remedying health state: Secondary | ICD-10-CM | POA: Diagnosis not present

## 2016-06-29 DIAGNOSIS — Z23 Encounter for immunization: Secondary | ICD-10-CM | POA: Diagnosis not present

## 2016-06-29 DIAGNOSIS — E213 Hyperparathyroidism, unspecified: Secondary | ICD-10-CM | POA: Diagnosis not present

## 2016-06-29 DIAGNOSIS — D631 Anemia in chronic kidney disease: Secondary | ICD-10-CM | POA: Diagnosis not present

## 2016-06-29 DIAGNOSIS — N2581 Secondary hyperparathyroidism of renal origin: Secondary | ICD-10-CM | POA: Diagnosis not present

## 2016-07-02 DIAGNOSIS — Z23 Encounter for immunization: Secondary | ICD-10-CM | POA: Diagnosis not present

## 2016-07-02 DIAGNOSIS — N2581 Secondary hyperparathyroidism of renal origin: Secondary | ICD-10-CM | POA: Diagnosis not present

## 2016-07-02 DIAGNOSIS — E213 Hyperparathyroidism, unspecified: Secondary | ICD-10-CM | POA: Diagnosis not present

## 2016-07-02 DIAGNOSIS — Z418 Encounter for other procedures for purposes other than remedying health state: Secondary | ICD-10-CM | POA: Diagnosis not present

## 2016-07-02 DIAGNOSIS — N186 End stage renal disease: Secondary | ICD-10-CM | POA: Diagnosis not present

## 2016-07-02 DIAGNOSIS — D631 Anemia in chronic kidney disease: Secondary | ICD-10-CM | POA: Diagnosis not present

## 2016-07-04 DIAGNOSIS — E213 Hyperparathyroidism, unspecified: Secondary | ICD-10-CM | POA: Diagnosis not present

## 2016-07-04 DIAGNOSIS — Z418 Encounter for other procedures for purposes other than remedying health state: Secondary | ICD-10-CM | POA: Diagnosis not present

## 2016-07-04 DIAGNOSIS — N2581 Secondary hyperparathyroidism of renal origin: Secondary | ICD-10-CM | POA: Diagnosis not present

## 2016-07-04 DIAGNOSIS — N186 End stage renal disease: Secondary | ICD-10-CM | POA: Diagnosis not present

## 2016-07-04 DIAGNOSIS — Z23 Encounter for immunization: Secondary | ICD-10-CM | POA: Diagnosis not present

## 2016-07-04 DIAGNOSIS — D631 Anemia in chronic kidney disease: Secondary | ICD-10-CM | POA: Diagnosis not present

## 2016-07-04 DIAGNOSIS — E119 Type 2 diabetes mellitus without complications: Secondary | ICD-10-CM | POA: Diagnosis not present

## 2016-07-06 DIAGNOSIS — E213 Hyperparathyroidism, unspecified: Secondary | ICD-10-CM | POA: Diagnosis not present

## 2016-07-06 DIAGNOSIS — N186 End stage renal disease: Secondary | ICD-10-CM | POA: Diagnosis not present

## 2016-07-06 DIAGNOSIS — D631 Anemia in chronic kidney disease: Secondary | ICD-10-CM | POA: Diagnosis not present

## 2016-07-06 DIAGNOSIS — Z418 Encounter for other procedures for purposes other than remedying health state: Secondary | ICD-10-CM | POA: Diagnosis not present

## 2016-07-06 DIAGNOSIS — Z23 Encounter for immunization: Secondary | ICD-10-CM | POA: Diagnosis not present

## 2016-07-06 DIAGNOSIS — N2581 Secondary hyperparathyroidism of renal origin: Secondary | ICD-10-CM | POA: Diagnosis not present

## 2016-07-09 DIAGNOSIS — D631 Anemia in chronic kidney disease: Secondary | ICD-10-CM | POA: Diagnosis not present

## 2016-07-09 DIAGNOSIS — N186 End stage renal disease: Secondary | ICD-10-CM | POA: Diagnosis not present

## 2016-07-09 DIAGNOSIS — E213 Hyperparathyroidism, unspecified: Secondary | ICD-10-CM | POA: Diagnosis not present

## 2016-07-09 DIAGNOSIS — Z418 Encounter for other procedures for purposes other than remedying health state: Secondary | ICD-10-CM | POA: Diagnosis not present

## 2016-07-09 DIAGNOSIS — Z23 Encounter for immunization: Secondary | ICD-10-CM | POA: Diagnosis not present

## 2016-07-09 DIAGNOSIS — N2581 Secondary hyperparathyroidism of renal origin: Secondary | ICD-10-CM | POA: Diagnosis not present

## 2016-07-11 DIAGNOSIS — Z23 Encounter for immunization: Secondary | ICD-10-CM | POA: Diagnosis not present

## 2016-07-11 DIAGNOSIS — D631 Anemia in chronic kidney disease: Secondary | ICD-10-CM | POA: Diagnosis not present

## 2016-07-11 DIAGNOSIS — E213 Hyperparathyroidism, unspecified: Secondary | ICD-10-CM | POA: Diagnosis not present

## 2016-07-11 DIAGNOSIS — Z418 Encounter for other procedures for purposes other than remedying health state: Secondary | ICD-10-CM | POA: Diagnosis not present

## 2016-07-11 DIAGNOSIS — N186 End stage renal disease: Secondary | ICD-10-CM | POA: Diagnosis not present

## 2016-07-11 DIAGNOSIS — N2581 Secondary hyperparathyroidism of renal origin: Secondary | ICD-10-CM | POA: Diagnosis not present

## 2016-07-12 DIAGNOSIS — D631 Anemia in chronic kidney disease: Secondary | ICD-10-CM | POA: Diagnosis not present

## 2016-07-12 DIAGNOSIS — N186 End stage renal disease: Secondary | ICD-10-CM | POA: Diagnosis not present

## 2016-07-12 DIAGNOSIS — I10 Essential (primary) hypertension: Secondary | ICD-10-CM | POA: Diagnosis not present

## 2016-07-13 DIAGNOSIS — D631 Anemia in chronic kidney disease: Secondary | ICD-10-CM | POA: Diagnosis not present

## 2016-07-13 DIAGNOSIS — Z418 Encounter for other procedures for purposes other than remedying health state: Secondary | ICD-10-CM | POA: Diagnosis not present

## 2016-07-13 DIAGNOSIS — N2581 Secondary hyperparathyroidism of renal origin: Secondary | ICD-10-CM | POA: Diagnosis not present

## 2016-07-13 DIAGNOSIS — T8249XA Other complication of vascular dialysis catheter, initial encounter: Secondary | ICD-10-CM | POA: Diagnosis not present

## 2016-07-13 DIAGNOSIS — N186 End stage renal disease: Secondary | ICD-10-CM | POA: Diagnosis not present

## 2016-07-16 DIAGNOSIS — N186 End stage renal disease: Secondary | ICD-10-CM | POA: Diagnosis not present

## 2016-07-16 DIAGNOSIS — D631 Anemia in chronic kidney disease: Secondary | ICD-10-CM | POA: Diagnosis not present

## 2016-07-16 DIAGNOSIS — Z418 Encounter for other procedures for purposes other than remedying health state: Secondary | ICD-10-CM | POA: Diagnosis not present

## 2016-07-16 DIAGNOSIS — N2581 Secondary hyperparathyroidism of renal origin: Secondary | ICD-10-CM | POA: Diagnosis not present

## 2016-07-16 DIAGNOSIS — T8249XA Other complication of vascular dialysis catheter, initial encounter: Secondary | ICD-10-CM | POA: Diagnosis not present

## 2016-07-17 DIAGNOSIS — F411 Generalized anxiety disorder: Secondary | ICD-10-CM | POA: Diagnosis not present

## 2016-07-17 DIAGNOSIS — G4700 Insomnia, unspecified: Secondary | ICD-10-CM | POA: Diagnosis not present

## 2016-07-17 DIAGNOSIS — F329 Major depressive disorder, single episode, unspecified: Secondary | ICD-10-CM | POA: Diagnosis not present

## 2016-07-18 DIAGNOSIS — Z418 Encounter for other procedures for purposes other than remedying health state: Secondary | ICD-10-CM | POA: Diagnosis not present

## 2016-07-18 DIAGNOSIS — N186 End stage renal disease: Secondary | ICD-10-CM | POA: Diagnosis not present

## 2016-07-18 DIAGNOSIS — Z114 Encounter for screening for human immunodeficiency virus [HIV]: Secondary | ICD-10-CM | POA: Diagnosis not present

## 2016-07-18 DIAGNOSIS — D631 Anemia in chronic kidney disease: Secondary | ICD-10-CM | POA: Diagnosis not present

## 2016-07-18 DIAGNOSIS — M109 Gout, unspecified: Secondary | ICD-10-CM | POA: Diagnosis not present

## 2016-07-18 DIAGNOSIS — N2581 Secondary hyperparathyroidism of renal origin: Secondary | ICD-10-CM | POA: Diagnosis not present

## 2016-07-18 DIAGNOSIS — E119 Type 2 diabetes mellitus without complications: Secondary | ICD-10-CM | POA: Diagnosis not present

## 2016-07-18 DIAGNOSIS — T8249XA Other complication of vascular dialysis catheter, initial encounter: Secondary | ICD-10-CM | POA: Diagnosis not present

## 2016-07-20 DIAGNOSIS — N2581 Secondary hyperparathyroidism of renal origin: Secondary | ICD-10-CM | POA: Diagnosis not present

## 2016-07-20 DIAGNOSIS — I10 Essential (primary) hypertension: Secondary | ICD-10-CM | POA: Diagnosis not present

## 2016-07-20 DIAGNOSIS — E119 Type 2 diabetes mellitus without complications: Secondary | ICD-10-CM | POA: Diagnosis not present

## 2016-07-20 DIAGNOSIS — Z418 Encounter for other procedures for purposes other than remedying health state: Secondary | ICD-10-CM | POA: Diagnosis not present

## 2016-07-20 DIAGNOSIS — T8249XA Other complication of vascular dialysis catheter, initial encounter: Secondary | ICD-10-CM | POA: Diagnosis not present

## 2016-07-20 DIAGNOSIS — N186 End stage renal disease: Secondary | ICD-10-CM | POA: Diagnosis not present

## 2016-07-20 DIAGNOSIS — D649 Anemia, unspecified: Secondary | ICD-10-CM | POA: Diagnosis not present

## 2016-07-20 DIAGNOSIS — D631 Anemia in chronic kidney disease: Secondary | ICD-10-CM | POA: Diagnosis not present

## 2016-07-20 DIAGNOSIS — M150 Primary generalized (osteo)arthritis: Secondary | ICD-10-CM | POA: Diagnosis not present

## 2016-07-23 DIAGNOSIS — D631 Anemia in chronic kidney disease: Secondary | ICD-10-CM | POA: Diagnosis not present

## 2016-07-23 DIAGNOSIS — N2581 Secondary hyperparathyroidism of renal origin: Secondary | ICD-10-CM | POA: Diagnosis not present

## 2016-07-23 DIAGNOSIS — Z418 Encounter for other procedures for purposes other than remedying health state: Secondary | ICD-10-CM | POA: Diagnosis not present

## 2016-07-23 DIAGNOSIS — T8249XA Other complication of vascular dialysis catheter, initial encounter: Secondary | ICD-10-CM | POA: Diagnosis not present

## 2016-07-23 DIAGNOSIS — N186 End stage renal disease: Secondary | ICD-10-CM | POA: Diagnosis not present

## 2016-07-25 DIAGNOSIS — Z418 Encounter for other procedures for purposes other than remedying health state: Secondary | ICD-10-CM | POA: Diagnosis not present

## 2016-07-25 DIAGNOSIS — D631 Anemia in chronic kidney disease: Secondary | ICD-10-CM | POA: Diagnosis not present

## 2016-07-25 DIAGNOSIS — N186 End stage renal disease: Secondary | ICD-10-CM | POA: Diagnosis not present

## 2016-07-25 DIAGNOSIS — T8249XA Other complication of vascular dialysis catheter, initial encounter: Secondary | ICD-10-CM | POA: Diagnosis not present

## 2016-07-25 DIAGNOSIS — N2581 Secondary hyperparathyroidism of renal origin: Secondary | ICD-10-CM | POA: Diagnosis not present

## 2016-07-27 DIAGNOSIS — D631 Anemia in chronic kidney disease: Secondary | ICD-10-CM | POA: Diagnosis not present

## 2016-07-27 DIAGNOSIS — I5022 Chronic systolic (congestive) heart failure: Secondary | ICD-10-CM | POA: Diagnosis not present

## 2016-07-27 DIAGNOSIS — Z418 Encounter for other procedures for purposes other than remedying health state: Secondary | ICD-10-CM | POA: Diagnosis not present

## 2016-07-27 DIAGNOSIS — N186 End stage renal disease: Secondary | ICD-10-CM | POA: Diagnosis not present

## 2016-07-27 DIAGNOSIS — I10 Essential (primary) hypertension: Secondary | ICD-10-CM | POA: Diagnosis not present

## 2016-07-27 DIAGNOSIS — Z89612 Acquired absence of left leg above knee: Secondary | ICD-10-CM | POA: Diagnosis not present

## 2016-07-27 DIAGNOSIS — N2581 Secondary hyperparathyroidism of renal origin: Secondary | ICD-10-CM | POA: Diagnosis not present

## 2016-07-27 DIAGNOSIS — E1122 Type 2 diabetes mellitus with diabetic chronic kidney disease: Secondary | ICD-10-CM | POA: Diagnosis not present

## 2016-07-27 DIAGNOSIS — T8249XA Other complication of vascular dialysis catheter, initial encounter: Secondary | ICD-10-CM | POA: Diagnosis not present

## 2016-07-30 DIAGNOSIS — D631 Anemia in chronic kidney disease: Secondary | ICD-10-CM | POA: Diagnosis not present

## 2016-07-30 DIAGNOSIS — N2581 Secondary hyperparathyroidism of renal origin: Secondary | ICD-10-CM | POA: Diagnosis not present

## 2016-07-30 DIAGNOSIS — N186 End stage renal disease: Secondary | ICD-10-CM | POA: Diagnosis not present

## 2016-07-30 DIAGNOSIS — Z418 Encounter for other procedures for purposes other than remedying health state: Secondary | ICD-10-CM | POA: Diagnosis not present

## 2016-07-30 DIAGNOSIS — T8249XA Other complication of vascular dialysis catheter, initial encounter: Secondary | ICD-10-CM | POA: Diagnosis not present

## 2016-08-01 DIAGNOSIS — N186 End stage renal disease: Secondary | ICD-10-CM | POA: Diagnosis not present

## 2016-08-01 DIAGNOSIS — N2581 Secondary hyperparathyroidism of renal origin: Secondary | ICD-10-CM | POA: Diagnosis not present

## 2016-08-01 DIAGNOSIS — T8249XA Other complication of vascular dialysis catheter, initial encounter: Secondary | ICD-10-CM | POA: Diagnosis not present

## 2016-08-01 DIAGNOSIS — D631 Anemia in chronic kidney disease: Secondary | ICD-10-CM | POA: Diagnosis not present

## 2016-08-01 DIAGNOSIS — Z418 Encounter for other procedures for purposes other than remedying health state: Secondary | ICD-10-CM | POA: Diagnosis not present

## 2016-08-03 DIAGNOSIS — D631 Anemia in chronic kidney disease: Secondary | ICD-10-CM | POA: Diagnosis not present

## 2016-08-03 DIAGNOSIS — N186 End stage renal disease: Secondary | ICD-10-CM | POA: Diagnosis not present

## 2016-08-03 DIAGNOSIS — N2581 Secondary hyperparathyroidism of renal origin: Secondary | ICD-10-CM | POA: Diagnosis not present

## 2016-08-03 DIAGNOSIS — Z418 Encounter for other procedures for purposes other than remedying health state: Secondary | ICD-10-CM | POA: Diagnosis not present

## 2016-08-03 DIAGNOSIS — T8249XA Other complication of vascular dialysis catheter, initial encounter: Secondary | ICD-10-CM | POA: Diagnosis not present

## 2016-08-06 DIAGNOSIS — Z418 Encounter for other procedures for purposes other than remedying health state: Secondary | ICD-10-CM | POA: Diagnosis not present

## 2016-08-06 DIAGNOSIS — N186 End stage renal disease: Secondary | ICD-10-CM | POA: Diagnosis not present

## 2016-08-06 DIAGNOSIS — T8249XA Other complication of vascular dialysis catheter, initial encounter: Secondary | ICD-10-CM | POA: Diagnosis not present

## 2016-08-06 DIAGNOSIS — D631 Anemia in chronic kidney disease: Secondary | ICD-10-CM | POA: Diagnosis not present

## 2016-08-06 DIAGNOSIS — N2581 Secondary hyperparathyroidism of renal origin: Secondary | ICD-10-CM | POA: Diagnosis not present

## 2016-08-08 DIAGNOSIS — Z418 Encounter for other procedures for purposes other than remedying health state: Secondary | ICD-10-CM | POA: Diagnosis not present

## 2016-08-08 DIAGNOSIS — D631 Anemia in chronic kidney disease: Secondary | ICD-10-CM | POA: Diagnosis not present

## 2016-08-08 DIAGNOSIS — N2581 Secondary hyperparathyroidism of renal origin: Secondary | ICD-10-CM | POA: Diagnosis not present

## 2016-08-08 DIAGNOSIS — N186 End stage renal disease: Secondary | ICD-10-CM | POA: Diagnosis not present

## 2016-08-08 DIAGNOSIS — T8249XA Other complication of vascular dialysis catheter, initial encounter: Secondary | ICD-10-CM | POA: Diagnosis not present

## 2016-08-09 DIAGNOSIS — E119 Type 2 diabetes mellitus without complications: Secondary | ICD-10-CM | POA: Diagnosis not present

## 2016-08-09 DIAGNOSIS — I5022 Chronic systolic (congestive) heart failure: Secondary | ICD-10-CM | POA: Diagnosis not present

## 2016-08-09 DIAGNOSIS — N186 End stage renal disease: Secondary | ICD-10-CM | POA: Diagnosis not present

## 2016-08-09 DIAGNOSIS — D631 Anemia in chronic kidney disease: Secondary | ICD-10-CM | POA: Diagnosis not present

## 2016-08-09 DIAGNOSIS — I10 Essential (primary) hypertension: Secondary | ICD-10-CM | POA: Diagnosis not present

## 2016-08-09 DIAGNOSIS — M109 Gout, unspecified: Secondary | ICD-10-CM | POA: Diagnosis not present

## 2016-08-10 DIAGNOSIS — D631 Anemia in chronic kidney disease: Secondary | ICD-10-CM | POA: Diagnosis not present

## 2016-08-10 DIAGNOSIS — N2581 Secondary hyperparathyroidism of renal origin: Secondary | ICD-10-CM | POA: Diagnosis not present

## 2016-08-10 DIAGNOSIS — N186 End stage renal disease: Secondary | ICD-10-CM | POA: Diagnosis not present

## 2016-08-10 DIAGNOSIS — T8249XA Other complication of vascular dialysis catheter, initial encounter: Secondary | ICD-10-CM | POA: Diagnosis not present

## 2016-08-10 DIAGNOSIS — Z418 Encounter for other procedures for purposes other than remedying health state: Secondary | ICD-10-CM | POA: Diagnosis not present

## 2016-08-13 DIAGNOSIS — T8249XA Other complication of vascular dialysis catheter, initial encounter: Secondary | ICD-10-CM | POA: Diagnosis not present

## 2016-08-13 DIAGNOSIS — N2581 Secondary hyperparathyroidism of renal origin: Secondary | ICD-10-CM | POA: Diagnosis not present

## 2016-08-13 DIAGNOSIS — D631 Anemia in chronic kidney disease: Secondary | ICD-10-CM | POA: Diagnosis not present

## 2016-08-13 DIAGNOSIS — Z418 Encounter for other procedures for purposes other than remedying health state: Secondary | ICD-10-CM | POA: Diagnosis not present

## 2016-08-13 DIAGNOSIS — N186 End stage renal disease: Secondary | ICD-10-CM | POA: Diagnosis not present

## 2016-08-15 DIAGNOSIS — N186 End stage renal disease: Secondary | ICD-10-CM | POA: Diagnosis not present

## 2016-08-15 DIAGNOSIS — Z418 Encounter for other procedures for purposes other than remedying health state: Secondary | ICD-10-CM | POA: Diagnosis not present

## 2016-08-15 DIAGNOSIS — T8249XA Other complication of vascular dialysis catheter, initial encounter: Secondary | ICD-10-CM | POA: Diagnosis not present

## 2016-08-15 DIAGNOSIS — N2581 Secondary hyperparathyroidism of renal origin: Secondary | ICD-10-CM | POA: Diagnosis not present

## 2016-08-15 DIAGNOSIS — E119 Type 2 diabetes mellitus without complications: Secondary | ICD-10-CM | POA: Diagnosis not present

## 2016-08-15 DIAGNOSIS — D631 Anemia in chronic kidney disease: Secondary | ICD-10-CM | POA: Diagnosis not present

## 2016-08-15 DIAGNOSIS — M109 Gout, unspecified: Secondary | ICD-10-CM | POA: Diagnosis not present

## 2016-08-17 DIAGNOSIS — D631 Anemia in chronic kidney disease: Secondary | ICD-10-CM | POA: Diagnosis not present

## 2016-08-17 DIAGNOSIS — T8249XA Other complication of vascular dialysis catheter, initial encounter: Secondary | ICD-10-CM | POA: Diagnosis not present

## 2016-08-17 DIAGNOSIS — N186 End stage renal disease: Secondary | ICD-10-CM | POA: Diagnosis not present

## 2016-08-17 DIAGNOSIS — N2581 Secondary hyperparathyroidism of renal origin: Secondary | ICD-10-CM | POA: Diagnosis not present

## 2016-08-17 DIAGNOSIS — Z418 Encounter for other procedures for purposes other than remedying health state: Secondary | ICD-10-CM | POA: Diagnosis not present

## 2016-08-20 DIAGNOSIS — N2581 Secondary hyperparathyroidism of renal origin: Secondary | ICD-10-CM | POA: Diagnosis not present

## 2016-08-20 DIAGNOSIS — N186 End stage renal disease: Secondary | ICD-10-CM | POA: Diagnosis not present

## 2016-08-20 DIAGNOSIS — D631 Anemia in chronic kidney disease: Secondary | ICD-10-CM | POA: Diagnosis not present

## 2016-08-20 DIAGNOSIS — T8249XA Other complication of vascular dialysis catheter, initial encounter: Secondary | ICD-10-CM | POA: Diagnosis not present

## 2016-08-20 DIAGNOSIS — Z418 Encounter for other procedures for purposes other than remedying health state: Secondary | ICD-10-CM | POA: Diagnosis not present

## 2016-08-21 DIAGNOSIS — F419 Anxiety disorder, unspecified: Secondary | ICD-10-CM | POA: Diagnosis not present

## 2016-08-21 DIAGNOSIS — G4700 Insomnia, unspecified: Secondary | ICD-10-CM | POA: Diagnosis not present

## 2016-08-21 DIAGNOSIS — F329 Major depressive disorder, single episode, unspecified: Secondary | ICD-10-CM | POA: Diagnosis not present

## 2016-08-22 DIAGNOSIS — D631 Anemia in chronic kidney disease: Secondary | ICD-10-CM | POA: Diagnosis not present

## 2016-08-22 DIAGNOSIS — N186 End stage renal disease: Secondary | ICD-10-CM | POA: Diagnosis not present

## 2016-08-22 DIAGNOSIS — N2581 Secondary hyperparathyroidism of renal origin: Secondary | ICD-10-CM | POA: Diagnosis not present

## 2016-08-22 DIAGNOSIS — Z418 Encounter for other procedures for purposes other than remedying health state: Secondary | ICD-10-CM | POA: Diagnosis not present

## 2016-08-22 DIAGNOSIS — T8249XA Other complication of vascular dialysis catheter, initial encounter: Secondary | ICD-10-CM | POA: Diagnosis not present

## 2016-08-24 DIAGNOSIS — D631 Anemia in chronic kidney disease: Secondary | ICD-10-CM | POA: Diagnosis not present

## 2016-08-24 DIAGNOSIS — Z418 Encounter for other procedures for purposes other than remedying health state: Secondary | ICD-10-CM | POA: Diagnosis not present

## 2016-08-24 DIAGNOSIS — N186 End stage renal disease: Secondary | ICD-10-CM | POA: Diagnosis not present

## 2016-08-24 DIAGNOSIS — T8249XA Other complication of vascular dialysis catheter, initial encounter: Secondary | ICD-10-CM | POA: Diagnosis not present

## 2016-08-24 DIAGNOSIS — N2581 Secondary hyperparathyroidism of renal origin: Secondary | ICD-10-CM | POA: Diagnosis not present

## 2016-08-27 DIAGNOSIS — T8249XA Other complication of vascular dialysis catheter, initial encounter: Secondary | ICD-10-CM | POA: Diagnosis not present

## 2016-08-27 DIAGNOSIS — N186 End stage renal disease: Secondary | ICD-10-CM | POA: Diagnosis not present

## 2016-08-27 DIAGNOSIS — N2581 Secondary hyperparathyroidism of renal origin: Secondary | ICD-10-CM | POA: Diagnosis not present

## 2016-08-27 DIAGNOSIS — Z418 Encounter for other procedures for purposes other than remedying health state: Secondary | ICD-10-CM | POA: Diagnosis not present

## 2016-08-27 DIAGNOSIS — D631 Anemia in chronic kidney disease: Secondary | ICD-10-CM | POA: Diagnosis not present

## 2016-08-29 DIAGNOSIS — Z418 Encounter for other procedures for purposes other than remedying health state: Secondary | ICD-10-CM | POA: Diagnosis not present

## 2016-08-29 DIAGNOSIS — N2581 Secondary hyperparathyroidism of renal origin: Secondary | ICD-10-CM | POA: Diagnosis not present

## 2016-08-29 DIAGNOSIS — N186 End stage renal disease: Secondary | ICD-10-CM | POA: Diagnosis not present

## 2016-08-29 DIAGNOSIS — D631 Anemia in chronic kidney disease: Secondary | ICD-10-CM | POA: Diagnosis not present

## 2016-08-29 DIAGNOSIS — T8249XA Other complication of vascular dialysis catheter, initial encounter: Secondary | ICD-10-CM | POA: Diagnosis not present

## 2016-08-31 DIAGNOSIS — D631 Anemia in chronic kidney disease: Secondary | ICD-10-CM | POA: Diagnosis not present

## 2016-08-31 DIAGNOSIS — Z418 Encounter for other procedures for purposes other than remedying health state: Secondary | ICD-10-CM | POA: Diagnosis not present

## 2016-08-31 DIAGNOSIS — T8249XA Other complication of vascular dialysis catheter, initial encounter: Secondary | ICD-10-CM | POA: Diagnosis not present

## 2016-08-31 DIAGNOSIS — N2581 Secondary hyperparathyroidism of renal origin: Secondary | ICD-10-CM | POA: Diagnosis not present

## 2016-08-31 DIAGNOSIS — M79622 Pain in left upper arm: Secondary | ICD-10-CM | POA: Diagnosis not present

## 2016-08-31 DIAGNOSIS — N186 End stage renal disease: Secondary | ICD-10-CM | POA: Diagnosis not present

## 2016-08-31 DIAGNOSIS — M25522 Pain in left elbow: Secondary | ICD-10-CM | POA: Diagnosis not present

## 2016-08-31 DIAGNOSIS — Z1159 Encounter for screening for other viral diseases: Secondary | ICD-10-CM | POA: Diagnosis not present

## 2016-09-03 DIAGNOSIS — N2581 Secondary hyperparathyroidism of renal origin: Secondary | ICD-10-CM | POA: Diagnosis not present

## 2016-09-03 DIAGNOSIS — N186 End stage renal disease: Secondary | ICD-10-CM | POA: Diagnosis not present

## 2016-09-03 DIAGNOSIS — Z418 Encounter for other procedures for purposes other than remedying health state: Secondary | ICD-10-CM | POA: Diagnosis not present

## 2016-09-03 DIAGNOSIS — D631 Anemia in chronic kidney disease: Secondary | ICD-10-CM | POA: Diagnosis not present

## 2016-09-03 DIAGNOSIS — T8249XA Other complication of vascular dialysis catheter, initial encounter: Secondary | ICD-10-CM | POA: Diagnosis not present

## 2016-09-05 DIAGNOSIS — D631 Anemia in chronic kidney disease: Secondary | ICD-10-CM | POA: Diagnosis not present

## 2016-09-05 DIAGNOSIS — N186 End stage renal disease: Secondary | ICD-10-CM | POA: Diagnosis not present

## 2016-09-05 DIAGNOSIS — T8249XA Other complication of vascular dialysis catheter, initial encounter: Secondary | ICD-10-CM | POA: Diagnosis not present

## 2016-09-05 DIAGNOSIS — N2581 Secondary hyperparathyroidism of renal origin: Secondary | ICD-10-CM | POA: Diagnosis not present

## 2016-09-05 DIAGNOSIS — Z418 Encounter for other procedures for purposes other than remedying health state: Secondary | ICD-10-CM | POA: Diagnosis not present

## 2016-09-07 DIAGNOSIS — N2581 Secondary hyperparathyroidism of renal origin: Secondary | ICD-10-CM | POA: Diagnosis not present

## 2016-09-07 DIAGNOSIS — Z418 Encounter for other procedures for purposes other than remedying health state: Secondary | ICD-10-CM | POA: Diagnosis not present

## 2016-09-07 DIAGNOSIS — T8249XA Other complication of vascular dialysis catheter, initial encounter: Secondary | ICD-10-CM | POA: Diagnosis not present

## 2016-09-07 DIAGNOSIS — D631 Anemia in chronic kidney disease: Secondary | ICD-10-CM | POA: Diagnosis not present

## 2016-09-07 DIAGNOSIS — N186 End stage renal disease: Secondary | ICD-10-CM | POA: Diagnosis not present

## 2016-09-09 DIAGNOSIS — I10 Essential (primary) hypertension: Secondary | ICD-10-CM | POA: Diagnosis not present

## 2016-09-09 DIAGNOSIS — N186 End stage renal disease: Secondary | ICD-10-CM | POA: Diagnosis not present

## 2016-09-09 DIAGNOSIS — D631 Anemia in chronic kidney disease: Secondary | ICD-10-CM | POA: Diagnosis not present

## 2016-09-10 DIAGNOSIS — T8249XA Other complication of vascular dialysis catheter, initial encounter: Secondary | ICD-10-CM | POA: Diagnosis not present

## 2016-09-10 DIAGNOSIS — N2581 Secondary hyperparathyroidism of renal origin: Secondary | ICD-10-CM | POA: Diagnosis not present

## 2016-09-10 DIAGNOSIS — D631 Anemia in chronic kidney disease: Secondary | ICD-10-CM | POA: Diagnosis not present

## 2016-09-10 DIAGNOSIS — N186 End stage renal disease: Secondary | ICD-10-CM | POA: Diagnosis not present

## 2016-09-10 DIAGNOSIS — Z418 Encounter for other procedures for purposes other than remedying health state: Secondary | ICD-10-CM | POA: Diagnosis not present

## 2016-09-11 DIAGNOSIS — I5022 Chronic systolic (congestive) heart failure: Secondary | ICD-10-CM | POA: Diagnosis not present

## 2016-09-11 DIAGNOSIS — I10 Essential (primary) hypertension: Secondary | ICD-10-CM | POA: Diagnosis not present

## 2016-09-11 DIAGNOSIS — D649 Anemia, unspecified: Secondary | ICD-10-CM | POA: Diagnosis not present

## 2016-09-11 DIAGNOSIS — E119 Type 2 diabetes mellitus without complications: Secondary | ICD-10-CM | POA: Diagnosis not present

## 2016-09-12 DIAGNOSIS — I10 Essential (primary) hypertension: Secondary | ICD-10-CM | POA: Diagnosis not present

## 2016-09-12 DIAGNOSIS — T8249XA Other complication of vascular dialysis catheter, initial encounter: Secondary | ICD-10-CM | POA: Diagnosis not present

## 2016-09-12 DIAGNOSIS — E119 Type 2 diabetes mellitus without complications: Secondary | ICD-10-CM | POA: Diagnosis not present

## 2016-09-12 DIAGNOSIS — M109 Gout, unspecified: Secondary | ICD-10-CM | POA: Diagnosis not present

## 2016-09-12 DIAGNOSIS — N186 End stage renal disease: Secondary | ICD-10-CM | POA: Diagnosis not present

## 2016-09-12 DIAGNOSIS — Z418 Encounter for other procedures for purposes other than remedying health state: Secondary | ICD-10-CM | POA: Diagnosis not present

## 2016-09-12 DIAGNOSIS — D631 Anemia in chronic kidney disease: Secondary | ICD-10-CM | POA: Diagnosis not present

## 2016-09-12 DIAGNOSIS — N2581 Secondary hyperparathyroidism of renal origin: Secondary | ICD-10-CM | POA: Diagnosis not present

## 2016-09-14 DIAGNOSIS — Z418 Encounter for other procedures for purposes other than remedying health state: Secondary | ICD-10-CM | POA: Diagnosis not present

## 2016-09-14 DIAGNOSIS — N2581 Secondary hyperparathyroidism of renal origin: Secondary | ICD-10-CM | POA: Diagnosis not present

## 2016-09-14 DIAGNOSIS — D631 Anemia in chronic kidney disease: Secondary | ICD-10-CM | POA: Diagnosis not present

## 2016-09-14 DIAGNOSIS — T8249XA Other complication of vascular dialysis catheter, initial encounter: Secondary | ICD-10-CM | POA: Diagnosis not present

## 2016-09-14 DIAGNOSIS — N186 End stage renal disease: Secondary | ICD-10-CM | POA: Diagnosis not present

## 2016-09-17 DIAGNOSIS — Z418 Encounter for other procedures for purposes other than remedying health state: Secondary | ICD-10-CM | POA: Diagnosis not present

## 2016-09-17 DIAGNOSIS — N2581 Secondary hyperparathyroidism of renal origin: Secondary | ICD-10-CM | POA: Diagnosis not present

## 2016-09-17 DIAGNOSIS — N186 End stage renal disease: Secondary | ICD-10-CM | POA: Diagnosis not present

## 2016-09-17 DIAGNOSIS — D631 Anemia in chronic kidney disease: Secondary | ICD-10-CM | POA: Diagnosis not present

## 2016-09-17 DIAGNOSIS — T8249XA Other complication of vascular dialysis catheter, initial encounter: Secondary | ICD-10-CM | POA: Diagnosis not present

## 2016-09-19 DIAGNOSIS — N186 End stage renal disease: Secondary | ICD-10-CM | POA: Diagnosis not present

## 2016-09-19 DIAGNOSIS — D631 Anemia in chronic kidney disease: Secondary | ICD-10-CM | POA: Diagnosis not present

## 2016-09-19 DIAGNOSIS — T8249XA Other complication of vascular dialysis catheter, initial encounter: Secondary | ICD-10-CM | POA: Diagnosis not present

## 2016-09-19 DIAGNOSIS — N2581 Secondary hyperparathyroidism of renal origin: Secondary | ICD-10-CM | POA: Diagnosis not present

## 2016-09-19 DIAGNOSIS — Z418 Encounter for other procedures for purposes other than remedying health state: Secondary | ICD-10-CM | POA: Diagnosis not present

## 2016-09-21 DIAGNOSIS — T8249XA Other complication of vascular dialysis catheter, initial encounter: Secondary | ICD-10-CM | POA: Diagnosis not present

## 2016-09-21 DIAGNOSIS — Z418 Encounter for other procedures for purposes other than remedying health state: Secondary | ICD-10-CM | POA: Diagnosis not present

## 2016-09-21 DIAGNOSIS — N186 End stage renal disease: Secondary | ICD-10-CM | POA: Diagnosis not present

## 2016-09-21 DIAGNOSIS — D631 Anemia in chronic kidney disease: Secondary | ICD-10-CM | POA: Diagnosis not present

## 2016-09-21 DIAGNOSIS — N2581 Secondary hyperparathyroidism of renal origin: Secondary | ICD-10-CM | POA: Diagnosis not present

## 2016-09-24 DIAGNOSIS — D631 Anemia in chronic kidney disease: Secondary | ICD-10-CM | POA: Diagnosis not present

## 2016-09-24 DIAGNOSIS — N186 End stage renal disease: Secondary | ICD-10-CM | POA: Diagnosis not present

## 2016-09-24 DIAGNOSIS — T8249XA Other complication of vascular dialysis catheter, initial encounter: Secondary | ICD-10-CM | POA: Diagnosis not present

## 2016-09-24 DIAGNOSIS — Z418 Encounter for other procedures for purposes other than remedying health state: Secondary | ICD-10-CM | POA: Diagnosis not present

## 2016-09-24 DIAGNOSIS — N2581 Secondary hyperparathyroidism of renal origin: Secondary | ICD-10-CM | POA: Diagnosis not present

## 2016-09-26 DIAGNOSIS — T8249XA Other complication of vascular dialysis catheter, initial encounter: Secondary | ICD-10-CM | POA: Diagnosis not present

## 2016-09-26 DIAGNOSIS — Z418 Encounter for other procedures for purposes other than remedying health state: Secondary | ICD-10-CM | POA: Diagnosis not present

## 2016-09-26 DIAGNOSIS — N186 End stage renal disease: Secondary | ICD-10-CM | POA: Diagnosis not present

## 2016-09-26 DIAGNOSIS — D631 Anemia in chronic kidney disease: Secondary | ICD-10-CM | POA: Diagnosis not present

## 2016-09-26 DIAGNOSIS — N2581 Secondary hyperparathyroidism of renal origin: Secondary | ICD-10-CM | POA: Diagnosis not present

## 2016-09-27 DIAGNOSIS — G4700 Insomnia, unspecified: Secondary | ICD-10-CM | POA: Diagnosis not present

## 2016-09-27 DIAGNOSIS — F329 Major depressive disorder, single episode, unspecified: Secondary | ICD-10-CM | POA: Diagnosis not present

## 2016-09-27 DIAGNOSIS — F419 Anxiety disorder, unspecified: Secondary | ICD-10-CM | POA: Diagnosis not present

## 2016-09-28 DIAGNOSIS — T8249XA Other complication of vascular dialysis catheter, initial encounter: Secondary | ICD-10-CM | POA: Diagnosis not present

## 2016-09-28 DIAGNOSIS — N2581 Secondary hyperparathyroidism of renal origin: Secondary | ICD-10-CM | POA: Diagnosis not present

## 2016-09-28 DIAGNOSIS — D631 Anemia in chronic kidney disease: Secondary | ICD-10-CM | POA: Diagnosis not present

## 2016-09-28 DIAGNOSIS — Z418 Encounter for other procedures for purposes other than remedying health state: Secondary | ICD-10-CM | POA: Diagnosis not present

## 2016-09-28 DIAGNOSIS — N186 End stage renal disease: Secondary | ICD-10-CM | POA: Diagnosis not present

## 2016-10-01 DIAGNOSIS — N186 End stage renal disease: Secondary | ICD-10-CM | POA: Diagnosis not present

## 2016-10-01 DIAGNOSIS — N2581 Secondary hyperparathyroidism of renal origin: Secondary | ICD-10-CM | POA: Diagnosis not present

## 2016-10-01 DIAGNOSIS — Z418 Encounter for other procedures for purposes other than remedying health state: Secondary | ICD-10-CM | POA: Diagnosis not present

## 2016-10-01 DIAGNOSIS — T8249XA Other complication of vascular dialysis catheter, initial encounter: Secondary | ICD-10-CM | POA: Diagnosis not present

## 2016-10-01 DIAGNOSIS — D631 Anemia in chronic kidney disease: Secondary | ICD-10-CM | POA: Diagnosis not present

## 2016-10-03 DIAGNOSIS — Z418 Encounter for other procedures for purposes other than remedying health state: Secondary | ICD-10-CM | POA: Diagnosis not present

## 2016-10-03 DIAGNOSIS — N186 End stage renal disease: Secondary | ICD-10-CM | POA: Diagnosis not present

## 2016-10-03 DIAGNOSIS — D631 Anemia in chronic kidney disease: Secondary | ICD-10-CM | POA: Diagnosis not present

## 2016-10-03 DIAGNOSIS — T8249XA Other complication of vascular dialysis catheter, initial encounter: Secondary | ICD-10-CM | POA: Diagnosis not present

## 2016-10-03 DIAGNOSIS — N2581 Secondary hyperparathyroidism of renal origin: Secondary | ICD-10-CM | POA: Diagnosis not present

## 2016-10-04 DIAGNOSIS — N186 End stage renal disease: Secondary | ICD-10-CM | POA: Diagnosis not present

## 2016-10-04 DIAGNOSIS — T8249XA Other complication of vascular dialysis catheter, initial encounter: Secondary | ICD-10-CM | POA: Diagnosis not present

## 2016-10-04 DIAGNOSIS — Z418 Encounter for other procedures for purposes other than remedying health state: Secondary | ICD-10-CM | POA: Diagnosis not present

## 2016-10-04 DIAGNOSIS — D631 Anemia in chronic kidney disease: Secondary | ICD-10-CM | POA: Diagnosis not present

## 2016-10-04 DIAGNOSIS — N2581 Secondary hyperparathyroidism of renal origin: Secondary | ICD-10-CM | POA: Diagnosis not present

## 2016-10-05 DIAGNOSIS — Z418 Encounter for other procedures for purposes other than remedying health state: Secondary | ICD-10-CM | POA: Diagnosis not present

## 2016-10-05 DIAGNOSIS — T8249XA Other complication of vascular dialysis catheter, initial encounter: Secondary | ICD-10-CM | POA: Diagnosis not present

## 2016-10-05 DIAGNOSIS — N186 End stage renal disease: Secondary | ICD-10-CM | POA: Diagnosis not present

## 2016-10-05 DIAGNOSIS — N2581 Secondary hyperparathyroidism of renal origin: Secondary | ICD-10-CM | POA: Diagnosis not present

## 2016-10-05 DIAGNOSIS — D631 Anemia in chronic kidney disease: Secondary | ICD-10-CM | POA: Diagnosis not present

## 2016-10-08 DIAGNOSIS — Z418 Encounter for other procedures for purposes other than remedying health state: Secondary | ICD-10-CM | POA: Diagnosis not present

## 2016-10-08 DIAGNOSIS — D631 Anemia in chronic kidney disease: Secondary | ICD-10-CM | POA: Diagnosis not present

## 2016-10-08 DIAGNOSIS — N2581 Secondary hyperparathyroidism of renal origin: Secondary | ICD-10-CM | POA: Diagnosis not present

## 2016-10-08 DIAGNOSIS — T8249XA Other complication of vascular dialysis catheter, initial encounter: Secondary | ICD-10-CM | POA: Diagnosis not present

## 2016-10-08 DIAGNOSIS — N186 End stage renal disease: Secondary | ICD-10-CM | POA: Diagnosis not present

## 2016-10-09 DIAGNOSIS — I10 Essential (primary) hypertension: Secondary | ICD-10-CM | POA: Diagnosis not present

## 2016-10-09 DIAGNOSIS — I482 Chronic atrial fibrillation: Secondary | ICD-10-CM | POA: Diagnosis not present

## 2016-10-09 DIAGNOSIS — N186 End stage renal disease: Secondary | ICD-10-CM | POA: Diagnosis not present

## 2016-10-09 DIAGNOSIS — R1311 Dysphagia, oral phase: Secondary | ICD-10-CM | POA: Diagnosis not present

## 2016-10-09 DIAGNOSIS — N183 Chronic kidney disease, stage 3 (moderate): Secondary | ICD-10-CM | POA: Diagnosis not present

## 2016-10-09 DIAGNOSIS — D631 Anemia in chronic kidney disease: Secondary | ICD-10-CM | POA: Diagnosis not present

## 2016-10-09 DIAGNOSIS — E1142 Type 2 diabetes mellitus with diabetic polyneuropathy: Secondary | ICD-10-CM | POA: Diagnosis not present

## 2016-10-10 DIAGNOSIS — E119 Type 2 diabetes mellitus without complications: Secondary | ICD-10-CM | POA: Diagnosis not present

## 2016-10-10 DIAGNOSIS — D631 Anemia in chronic kidney disease: Secondary | ICD-10-CM | POA: Diagnosis not present

## 2016-10-10 DIAGNOSIS — N186 End stage renal disease: Secondary | ICD-10-CM | POA: Diagnosis not present

## 2016-10-10 DIAGNOSIS — Z418 Encounter for other procedures for purposes other than remedying health state: Secondary | ICD-10-CM | POA: Diagnosis not present

## 2016-10-10 DIAGNOSIS — M109 Gout, unspecified: Secondary | ICD-10-CM | POA: Diagnosis not present

## 2016-10-10 DIAGNOSIS — N2581 Secondary hyperparathyroidism of renal origin: Secondary | ICD-10-CM | POA: Diagnosis not present

## 2016-10-11 DIAGNOSIS — N186 End stage renal disease: Secondary | ICD-10-CM | POA: Diagnosis not present

## 2016-10-11 DIAGNOSIS — I10 Essential (primary) hypertension: Secondary | ICD-10-CM | POA: Diagnosis not present

## 2016-10-11 DIAGNOSIS — R1311 Dysphagia, oral phase: Secondary | ICD-10-CM | POA: Diagnosis not present

## 2016-10-11 DIAGNOSIS — E1142 Type 2 diabetes mellitus with diabetic polyneuropathy: Secondary | ICD-10-CM | POA: Diagnosis not present

## 2016-10-11 DIAGNOSIS — I482 Chronic atrial fibrillation: Secondary | ICD-10-CM | POA: Diagnosis not present

## 2016-10-12 DIAGNOSIS — Z418 Encounter for other procedures for purposes other than remedying health state: Secondary | ICD-10-CM | POA: Diagnosis not present

## 2016-10-12 DIAGNOSIS — N186 End stage renal disease: Secondary | ICD-10-CM | POA: Diagnosis not present

## 2016-10-12 DIAGNOSIS — R1311 Dysphagia, oral phase: Secondary | ICD-10-CM | POA: Diagnosis not present

## 2016-10-12 DIAGNOSIS — D631 Anemia in chronic kidney disease: Secondary | ICD-10-CM | POA: Diagnosis not present

## 2016-10-12 DIAGNOSIS — E1142 Type 2 diabetes mellitus with diabetic polyneuropathy: Secondary | ICD-10-CM | POA: Diagnosis not present

## 2016-10-12 DIAGNOSIS — I482 Chronic atrial fibrillation: Secondary | ICD-10-CM | POA: Diagnosis not present

## 2016-10-12 DIAGNOSIS — I10 Essential (primary) hypertension: Secondary | ICD-10-CM | POA: Diagnosis not present

## 2016-10-12 DIAGNOSIS — N2581 Secondary hyperparathyroidism of renal origin: Secondary | ICD-10-CM | POA: Diagnosis not present

## 2016-10-15 DIAGNOSIS — N186 End stage renal disease: Secondary | ICD-10-CM | POA: Diagnosis not present

## 2016-10-15 DIAGNOSIS — D631 Anemia in chronic kidney disease: Secondary | ICD-10-CM | POA: Diagnosis not present

## 2016-10-15 DIAGNOSIS — Z418 Encounter for other procedures for purposes other than remedying health state: Secondary | ICD-10-CM | POA: Diagnosis not present

## 2016-10-15 DIAGNOSIS — N2581 Secondary hyperparathyroidism of renal origin: Secondary | ICD-10-CM | POA: Diagnosis not present

## 2016-10-16 DIAGNOSIS — N186 End stage renal disease: Secondary | ICD-10-CM | POA: Diagnosis not present

## 2016-10-16 DIAGNOSIS — I10 Essential (primary) hypertension: Secondary | ICD-10-CM | POA: Diagnosis not present

## 2016-10-16 DIAGNOSIS — I482 Chronic atrial fibrillation: Secondary | ICD-10-CM | POA: Diagnosis not present

## 2016-10-16 DIAGNOSIS — E1142 Type 2 diabetes mellitus with diabetic polyneuropathy: Secondary | ICD-10-CM | POA: Diagnosis not present

## 2016-10-16 DIAGNOSIS — R1311 Dysphagia, oral phase: Secondary | ICD-10-CM | POA: Diagnosis not present

## 2016-10-17 DIAGNOSIS — Z418 Encounter for other procedures for purposes other than remedying health state: Secondary | ICD-10-CM | POA: Diagnosis not present

## 2016-10-17 DIAGNOSIS — N186 End stage renal disease: Secondary | ICD-10-CM | POA: Diagnosis not present

## 2016-10-17 DIAGNOSIS — N2581 Secondary hyperparathyroidism of renal origin: Secondary | ICD-10-CM | POA: Diagnosis not present

## 2016-10-17 DIAGNOSIS — D631 Anemia in chronic kidney disease: Secondary | ICD-10-CM | POA: Diagnosis not present

## 2016-10-19 DIAGNOSIS — N186 End stage renal disease: Secondary | ICD-10-CM | POA: Diagnosis not present

## 2016-10-19 DIAGNOSIS — M6281 Muscle weakness (generalized): Secondary | ICD-10-CM | POA: Diagnosis not present

## 2016-10-19 DIAGNOSIS — D631 Anemia in chronic kidney disease: Secondary | ICD-10-CM | POA: Diagnosis not present

## 2016-10-19 DIAGNOSIS — R1311 Dysphagia, oral phase: Secondary | ICD-10-CM | POA: Diagnosis not present

## 2016-10-19 DIAGNOSIS — N2581 Secondary hyperparathyroidism of renal origin: Secondary | ICD-10-CM | POA: Diagnosis not present

## 2016-10-19 DIAGNOSIS — N185 Chronic kidney disease, stage 5: Secondary | ICD-10-CM | POA: Diagnosis not present

## 2016-10-19 DIAGNOSIS — E1142 Type 2 diabetes mellitus with diabetic polyneuropathy: Secondary | ICD-10-CM | POA: Diagnosis not present

## 2016-10-19 DIAGNOSIS — I10 Essential (primary) hypertension: Secondary | ICD-10-CM | POA: Diagnosis not present

## 2016-10-19 DIAGNOSIS — I482 Chronic atrial fibrillation: Secondary | ICD-10-CM | POA: Diagnosis not present

## 2016-10-19 DIAGNOSIS — E1122 Type 2 diabetes mellitus with diabetic chronic kidney disease: Secondary | ICD-10-CM | POA: Diagnosis not present

## 2016-10-19 DIAGNOSIS — Z418 Encounter for other procedures for purposes other than remedying health state: Secondary | ICD-10-CM | POA: Diagnosis not present

## 2016-10-21 DIAGNOSIS — M6281 Muscle weakness (generalized): Secondary | ICD-10-CM | POA: Diagnosis not present

## 2016-10-21 DIAGNOSIS — E119 Type 2 diabetes mellitus without complications: Secondary | ICD-10-CM | POA: Diagnosis not present

## 2016-10-21 DIAGNOSIS — D649 Anemia, unspecified: Secondary | ICD-10-CM | POA: Diagnosis not present

## 2016-10-21 DIAGNOSIS — I10 Essential (primary) hypertension: Secondary | ICD-10-CM | POA: Diagnosis not present

## 2016-10-22 DIAGNOSIS — I10 Essential (primary) hypertension: Secondary | ICD-10-CM | POA: Diagnosis not present

## 2016-10-22 DIAGNOSIS — Z418 Encounter for other procedures for purposes other than remedying health state: Secondary | ICD-10-CM | POA: Diagnosis not present

## 2016-10-22 DIAGNOSIS — D631 Anemia in chronic kidney disease: Secondary | ICD-10-CM | POA: Diagnosis not present

## 2016-10-22 DIAGNOSIS — E1142 Type 2 diabetes mellitus with diabetic polyneuropathy: Secondary | ICD-10-CM | POA: Diagnosis not present

## 2016-10-22 DIAGNOSIS — N2581 Secondary hyperparathyroidism of renal origin: Secondary | ICD-10-CM | POA: Diagnosis not present

## 2016-10-22 DIAGNOSIS — N186 End stage renal disease: Secondary | ICD-10-CM | POA: Diagnosis not present

## 2016-10-22 DIAGNOSIS — I482 Chronic atrial fibrillation: Secondary | ICD-10-CM | POA: Diagnosis not present

## 2016-10-22 DIAGNOSIS — R1311 Dysphagia, oral phase: Secondary | ICD-10-CM | POA: Diagnosis not present

## 2016-10-23 DIAGNOSIS — I482 Chronic atrial fibrillation: Secondary | ICD-10-CM | POA: Diagnosis not present

## 2016-10-23 DIAGNOSIS — I10 Essential (primary) hypertension: Secondary | ICD-10-CM | POA: Diagnosis not present

## 2016-10-23 DIAGNOSIS — E1142 Type 2 diabetes mellitus with diabetic polyneuropathy: Secondary | ICD-10-CM | POA: Diagnosis not present

## 2016-10-23 DIAGNOSIS — R1311 Dysphagia, oral phase: Secondary | ICD-10-CM | POA: Diagnosis not present

## 2016-10-23 DIAGNOSIS — N186 End stage renal disease: Secondary | ICD-10-CM | POA: Diagnosis not present

## 2016-10-24 DIAGNOSIS — N186 End stage renal disease: Secondary | ICD-10-CM | POA: Diagnosis not present

## 2016-10-24 DIAGNOSIS — R1311 Dysphagia, oral phase: Secondary | ICD-10-CM | POA: Diagnosis not present

## 2016-10-24 DIAGNOSIS — E1142 Type 2 diabetes mellitus with diabetic polyneuropathy: Secondary | ICD-10-CM | POA: Diagnosis not present

## 2016-10-24 DIAGNOSIS — Z79899 Other long term (current) drug therapy: Secondary | ICD-10-CM | POA: Diagnosis not present

## 2016-10-24 DIAGNOSIS — Z9981 Dependence on supplemental oxygen: Secondary | ICD-10-CM | POA: Diagnosis not present

## 2016-10-24 DIAGNOSIS — D631 Anemia in chronic kidney disease: Secondary | ICD-10-CM | POA: Diagnosis not present

## 2016-10-24 DIAGNOSIS — I10 Essential (primary) hypertension: Secondary | ICD-10-CM | POA: Diagnosis not present

## 2016-10-24 DIAGNOSIS — Z418 Encounter for other procedures for purposes other than remedying health state: Secondary | ICD-10-CM | POA: Diagnosis not present

## 2016-10-24 DIAGNOSIS — Z7401 Bed confinement status: Secondary | ICD-10-CM | POA: Diagnosis not present

## 2016-10-24 DIAGNOSIS — N2581 Secondary hyperparathyroidism of renal origin: Secondary | ICD-10-CM | POA: Diagnosis not present

## 2016-10-24 DIAGNOSIS — G40909 Epilepsy, unspecified, not intractable, without status epilepticus: Secondary | ICD-10-CM | POA: Diagnosis not present

## 2016-10-24 DIAGNOSIS — I482 Chronic atrial fibrillation: Secondary | ICD-10-CM | POA: Diagnosis not present

## 2016-10-25 DIAGNOSIS — E1142 Type 2 diabetes mellitus with diabetic polyneuropathy: Secondary | ICD-10-CM | POA: Diagnosis not present

## 2016-10-25 DIAGNOSIS — I10 Essential (primary) hypertension: Secondary | ICD-10-CM | POA: Diagnosis not present

## 2016-10-25 DIAGNOSIS — R1311 Dysphagia, oral phase: Secondary | ICD-10-CM | POA: Diagnosis not present

## 2016-10-25 DIAGNOSIS — N186 End stage renal disease: Secondary | ICD-10-CM | POA: Diagnosis not present

## 2016-10-25 DIAGNOSIS — I482 Chronic atrial fibrillation: Secondary | ICD-10-CM | POA: Diagnosis not present

## 2016-10-26 DIAGNOSIS — N186 End stage renal disease: Secondary | ICD-10-CM | POA: Diagnosis not present

## 2016-10-26 DIAGNOSIS — D631 Anemia in chronic kidney disease: Secondary | ICD-10-CM | POA: Diagnosis not present

## 2016-10-26 DIAGNOSIS — Z7401 Bed confinement status: Secondary | ICD-10-CM | POA: Diagnosis not present

## 2016-10-26 DIAGNOSIS — Z9981 Dependence on supplemental oxygen: Secondary | ICD-10-CM | POA: Diagnosis not present

## 2016-10-26 DIAGNOSIS — Z418 Encounter for other procedures for purposes other than remedying health state: Secondary | ICD-10-CM | POA: Diagnosis not present

## 2016-10-26 DIAGNOSIS — E1142 Type 2 diabetes mellitus with diabetic polyneuropathy: Secondary | ICD-10-CM | POA: Diagnosis not present

## 2016-10-26 DIAGNOSIS — I10 Essential (primary) hypertension: Secondary | ICD-10-CM | POA: Diagnosis not present

## 2016-10-26 DIAGNOSIS — G40909 Epilepsy, unspecified, not intractable, without status epilepticus: Secondary | ICD-10-CM | POA: Diagnosis not present

## 2016-10-26 DIAGNOSIS — N2581 Secondary hyperparathyroidism of renal origin: Secondary | ICD-10-CM | POA: Diagnosis not present

## 2016-10-29 DIAGNOSIS — N186 End stage renal disease: Secondary | ICD-10-CM | POA: Diagnosis not present

## 2016-10-29 DIAGNOSIS — Z7401 Bed confinement status: Secondary | ICD-10-CM | POA: Diagnosis not present

## 2016-10-29 DIAGNOSIS — Z9981 Dependence on supplemental oxygen: Secondary | ICD-10-CM | POA: Diagnosis not present

## 2016-10-29 DIAGNOSIS — D631 Anemia in chronic kidney disease: Secondary | ICD-10-CM | POA: Diagnosis not present

## 2016-10-29 DIAGNOSIS — N2581 Secondary hyperparathyroidism of renal origin: Secondary | ICD-10-CM | POA: Diagnosis not present

## 2016-10-29 DIAGNOSIS — Z418 Encounter for other procedures for purposes other than remedying health state: Secondary | ICD-10-CM | POA: Diagnosis not present

## 2016-10-30 DIAGNOSIS — I482 Chronic atrial fibrillation: Secondary | ICD-10-CM | POA: Diagnosis not present

## 2016-10-30 DIAGNOSIS — N186 End stage renal disease: Secondary | ICD-10-CM | POA: Diagnosis not present

## 2016-10-30 DIAGNOSIS — E0822 Diabetes mellitus due to underlying condition with diabetic chronic kidney disease: Secondary | ICD-10-CM | POA: Diagnosis not present

## 2016-10-30 DIAGNOSIS — E1142 Type 2 diabetes mellitus with diabetic polyneuropathy: Secondary | ICD-10-CM | POA: Diagnosis not present

## 2016-10-30 DIAGNOSIS — F419 Anxiety disorder, unspecified: Secondary | ICD-10-CM | POA: Diagnosis not present

## 2016-10-30 DIAGNOSIS — G4700 Insomnia, unspecified: Secondary | ICD-10-CM | POA: Diagnosis not present

## 2016-10-30 DIAGNOSIS — F329 Major depressive disorder, single episode, unspecified: Secondary | ICD-10-CM | POA: Diagnosis not present

## 2016-10-30 DIAGNOSIS — R1311 Dysphagia, oral phase: Secondary | ICD-10-CM | POA: Diagnosis not present

## 2016-10-30 DIAGNOSIS — I10 Essential (primary) hypertension: Secondary | ICD-10-CM | POA: Diagnosis not present

## 2016-10-31 DIAGNOSIS — Z418 Encounter for other procedures for purposes other than remedying health state: Secondary | ICD-10-CM | POA: Diagnosis not present

## 2016-10-31 DIAGNOSIS — Z9981 Dependence on supplemental oxygen: Secondary | ICD-10-CM | POA: Diagnosis not present

## 2016-10-31 DIAGNOSIS — I10 Essential (primary) hypertension: Secondary | ICD-10-CM | POA: Diagnosis not present

## 2016-10-31 DIAGNOSIS — I482 Chronic atrial fibrillation: Secondary | ICD-10-CM | POA: Diagnosis not present

## 2016-10-31 DIAGNOSIS — D631 Anemia in chronic kidney disease: Secondary | ICD-10-CM | POA: Diagnosis not present

## 2016-10-31 DIAGNOSIS — N2581 Secondary hyperparathyroidism of renal origin: Secondary | ICD-10-CM | POA: Diagnosis not present

## 2016-10-31 DIAGNOSIS — Z7401 Bed confinement status: Secondary | ICD-10-CM | POA: Diagnosis not present

## 2016-10-31 DIAGNOSIS — E1142 Type 2 diabetes mellitus with diabetic polyneuropathy: Secondary | ICD-10-CM | POA: Diagnosis not present

## 2016-10-31 DIAGNOSIS — R1311 Dysphagia, oral phase: Secondary | ICD-10-CM | POA: Diagnosis not present

## 2016-10-31 DIAGNOSIS — N186 End stage renal disease: Secondary | ICD-10-CM | POA: Diagnosis not present

## 2016-11-02 DIAGNOSIS — Z9981 Dependence on supplemental oxygen: Secondary | ICD-10-CM | POA: Diagnosis not present

## 2016-11-02 DIAGNOSIS — N2581 Secondary hyperparathyroidism of renal origin: Secondary | ICD-10-CM | POA: Diagnosis not present

## 2016-11-02 DIAGNOSIS — Z418 Encounter for other procedures for purposes other than remedying health state: Secondary | ICD-10-CM | POA: Diagnosis not present

## 2016-11-02 DIAGNOSIS — N186 End stage renal disease: Secondary | ICD-10-CM | POA: Diagnosis not present

## 2016-11-02 DIAGNOSIS — Z7401 Bed confinement status: Secondary | ICD-10-CM | POA: Diagnosis not present

## 2016-11-02 DIAGNOSIS — D631 Anemia in chronic kidney disease: Secondary | ICD-10-CM | POA: Diagnosis not present

## 2016-11-05 DIAGNOSIS — Z9981 Dependence on supplemental oxygen: Secondary | ICD-10-CM | POA: Diagnosis not present

## 2016-11-05 DIAGNOSIS — Z7401 Bed confinement status: Secondary | ICD-10-CM | POA: Diagnosis not present

## 2016-11-05 DIAGNOSIS — N2581 Secondary hyperparathyroidism of renal origin: Secondary | ICD-10-CM | POA: Diagnosis not present

## 2016-11-05 DIAGNOSIS — I482 Chronic atrial fibrillation: Secondary | ICD-10-CM | POA: Diagnosis not present

## 2016-11-05 DIAGNOSIS — R1311 Dysphagia, oral phase: Secondary | ICD-10-CM | POA: Diagnosis not present

## 2016-11-05 DIAGNOSIS — E1142 Type 2 diabetes mellitus with diabetic polyneuropathy: Secondary | ICD-10-CM | POA: Diagnosis not present

## 2016-11-05 DIAGNOSIS — D631 Anemia in chronic kidney disease: Secondary | ICD-10-CM | POA: Diagnosis not present

## 2016-11-05 DIAGNOSIS — I10 Essential (primary) hypertension: Secondary | ICD-10-CM | POA: Diagnosis not present

## 2016-11-05 DIAGNOSIS — N186 End stage renal disease: Secondary | ICD-10-CM | POA: Diagnosis not present

## 2016-11-05 DIAGNOSIS — Z418 Encounter for other procedures for purposes other than remedying health state: Secondary | ICD-10-CM | POA: Diagnosis not present

## 2016-11-07 DIAGNOSIS — E1142 Type 2 diabetes mellitus with diabetic polyneuropathy: Secondary | ICD-10-CM | POA: Diagnosis not present

## 2016-11-07 DIAGNOSIS — Z418 Encounter for other procedures for purposes other than remedying health state: Secondary | ICD-10-CM | POA: Diagnosis not present

## 2016-11-07 DIAGNOSIS — Z9981 Dependence on supplemental oxygen: Secondary | ICD-10-CM | POA: Diagnosis not present

## 2016-11-07 DIAGNOSIS — N186 End stage renal disease: Secondary | ICD-10-CM | POA: Diagnosis not present

## 2016-11-07 DIAGNOSIS — N2581 Secondary hyperparathyroidism of renal origin: Secondary | ICD-10-CM | POA: Diagnosis not present

## 2016-11-07 DIAGNOSIS — R1311 Dysphagia, oral phase: Secondary | ICD-10-CM | POA: Diagnosis not present

## 2016-11-07 DIAGNOSIS — I10 Essential (primary) hypertension: Secondary | ICD-10-CM | POA: Diagnosis not present

## 2016-11-07 DIAGNOSIS — D631 Anemia in chronic kidney disease: Secondary | ICD-10-CM | POA: Diagnosis not present

## 2016-11-07 DIAGNOSIS — I482 Chronic atrial fibrillation: Secondary | ICD-10-CM | POA: Diagnosis not present

## 2016-11-07 DIAGNOSIS — Z7401 Bed confinement status: Secondary | ICD-10-CM | POA: Diagnosis not present

## 2016-11-09 DIAGNOSIS — I10 Essential (primary) hypertension: Secondary | ICD-10-CM | POA: Diagnosis not present

## 2016-11-09 DIAGNOSIS — I482 Chronic atrial fibrillation: Secondary | ICD-10-CM | POA: Diagnosis not present

## 2016-11-09 DIAGNOSIS — N2581 Secondary hyperparathyroidism of renal origin: Secondary | ICD-10-CM | POA: Diagnosis not present

## 2016-11-09 DIAGNOSIS — T8249XA Other complication of vascular dialysis catheter, initial encounter: Secondary | ICD-10-CM | POA: Diagnosis not present

## 2016-11-09 DIAGNOSIS — E1142 Type 2 diabetes mellitus with diabetic polyneuropathy: Secondary | ICD-10-CM | POA: Diagnosis not present

## 2016-11-09 DIAGNOSIS — N183 Chronic kidney disease, stage 3 (moderate): Secondary | ICD-10-CM | POA: Diagnosis not present

## 2016-11-09 DIAGNOSIS — R1311 Dysphagia, oral phase: Secondary | ICD-10-CM | POA: Diagnosis not present

## 2016-11-09 DIAGNOSIS — Z418 Encounter for other procedures for purposes other than remedying health state: Secondary | ICD-10-CM | POA: Diagnosis not present

## 2016-11-09 DIAGNOSIS — D631 Anemia in chronic kidney disease: Secondary | ICD-10-CM | POA: Diagnosis not present

## 2016-11-09 DIAGNOSIS — N186 End stage renal disease: Secondary | ICD-10-CM | POA: Diagnosis not present

## 2016-11-09 DIAGNOSIS — Z7401 Bed confinement status: Secondary | ICD-10-CM | POA: Diagnosis not present

## 2016-11-09 DIAGNOSIS — Z9981 Dependence on supplemental oxygen: Secondary | ICD-10-CM | POA: Diagnosis not present

## 2016-11-12 DIAGNOSIS — N2581 Secondary hyperparathyroidism of renal origin: Secondary | ICD-10-CM | POA: Diagnosis not present

## 2016-11-12 DIAGNOSIS — T8249XA Other complication of vascular dialysis catheter, initial encounter: Secondary | ICD-10-CM | POA: Diagnosis not present

## 2016-11-12 DIAGNOSIS — I482 Chronic atrial fibrillation: Secondary | ICD-10-CM | POA: Diagnosis not present

## 2016-11-12 DIAGNOSIS — Z9981 Dependence on supplemental oxygen: Secondary | ICD-10-CM | POA: Diagnosis not present

## 2016-11-12 DIAGNOSIS — Z7401 Bed confinement status: Secondary | ICD-10-CM | POA: Diagnosis not present

## 2016-11-12 DIAGNOSIS — I10 Essential (primary) hypertension: Secondary | ICD-10-CM | POA: Diagnosis not present

## 2016-11-12 DIAGNOSIS — Z418 Encounter for other procedures for purposes other than remedying health state: Secondary | ICD-10-CM | POA: Diagnosis not present

## 2016-11-12 DIAGNOSIS — R1311 Dysphagia, oral phase: Secondary | ICD-10-CM | POA: Diagnosis not present

## 2016-11-12 DIAGNOSIS — D631 Anemia in chronic kidney disease: Secondary | ICD-10-CM | POA: Diagnosis not present

## 2016-11-12 DIAGNOSIS — E1142 Type 2 diabetes mellitus with diabetic polyneuropathy: Secondary | ICD-10-CM | POA: Diagnosis not present

## 2016-11-12 DIAGNOSIS — N186 End stage renal disease: Secondary | ICD-10-CM | POA: Diagnosis not present

## 2016-11-13 DIAGNOSIS — I482 Chronic atrial fibrillation: Secondary | ICD-10-CM | POA: Diagnosis not present

## 2016-11-13 DIAGNOSIS — N186 End stage renal disease: Secondary | ICD-10-CM | POA: Diagnosis not present

## 2016-11-13 DIAGNOSIS — I10 Essential (primary) hypertension: Secondary | ICD-10-CM | POA: Diagnosis not present

## 2016-11-13 DIAGNOSIS — E1142 Type 2 diabetes mellitus with diabetic polyneuropathy: Secondary | ICD-10-CM | POA: Diagnosis not present

## 2016-11-13 DIAGNOSIS — R1311 Dysphagia, oral phase: Secondary | ICD-10-CM | POA: Diagnosis not present

## 2016-11-14 DIAGNOSIS — I482 Chronic atrial fibrillation: Secondary | ICD-10-CM | POA: Diagnosis not present

## 2016-11-14 DIAGNOSIS — Z418 Encounter for other procedures for purposes other than remedying health state: Secondary | ICD-10-CM | POA: Diagnosis not present

## 2016-11-14 DIAGNOSIS — N2581 Secondary hyperparathyroidism of renal origin: Secondary | ICD-10-CM | POA: Diagnosis not present

## 2016-11-14 DIAGNOSIS — Z7401 Bed confinement status: Secondary | ICD-10-CM | POA: Diagnosis not present

## 2016-11-14 DIAGNOSIS — D631 Anemia in chronic kidney disease: Secondary | ICD-10-CM | POA: Diagnosis not present

## 2016-11-14 DIAGNOSIS — E1142 Type 2 diabetes mellitus with diabetic polyneuropathy: Secondary | ICD-10-CM | POA: Diagnosis not present

## 2016-11-14 DIAGNOSIS — T8249XA Other complication of vascular dialysis catheter, initial encounter: Secondary | ICD-10-CM | POA: Diagnosis not present

## 2016-11-14 DIAGNOSIS — M109 Gout, unspecified: Secondary | ICD-10-CM | POA: Diagnosis not present

## 2016-11-14 DIAGNOSIS — I10 Essential (primary) hypertension: Secondary | ICD-10-CM | POA: Diagnosis not present

## 2016-11-14 DIAGNOSIS — Z9981 Dependence on supplemental oxygen: Secondary | ICD-10-CM | POA: Diagnosis not present

## 2016-11-14 DIAGNOSIS — R1311 Dysphagia, oral phase: Secondary | ICD-10-CM | POA: Diagnosis not present

## 2016-11-14 DIAGNOSIS — N186 End stage renal disease: Secondary | ICD-10-CM | POA: Diagnosis not present

## 2016-11-14 DIAGNOSIS — E119 Type 2 diabetes mellitus without complications: Secondary | ICD-10-CM | POA: Diagnosis not present

## 2016-11-16 DIAGNOSIS — Z7401 Bed confinement status: Secondary | ICD-10-CM | POA: Diagnosis not present

## 2016-11-16 DIAGNOSIS — N186 End stage renal disease: Secondary | ICD-10-CM | POA: Diagnosis not present

## 2016-11-16 DIAGNOSIS — Z418 Encounter for other procedures for purposes other than remedying health state: Secondary | ICD-10-CM | POA: Diagnosis not present

## 2016-11-16 DIAGNOSIS — N2581 Secondary hyperparathyroidism of renal origin: Secondary | ICD-10-CM | POA: Diagnosis not present

## 2016-11-16 DIAGNOSIS — D631 Anemia in chronic kidney disease: Secondary | ICD-10-CM | POA: Diagnosis not present

## 2016-11-16 DIAGNOSIS — Z9981 Dependence on supplemental oxygen: Secondary | ICD-10-CM | POA: Diagnosis not present

## 2016-11-16 DIAGNOSIS — T8249XA Other complication of vascular dialysis catheter, initial encounter: Secondary | ICD-10-CM | POA: Diagnosis not present

## 2016-11-19 DIAGNOSIS — Z9981 Dependence on supplemental oxygen: Secondary | ICD-10-CM | POA: Diagnosis not present

## 2016-11-19 DIAGNOSIS — R1311 Dysphagia, oral phase: Secondary | ICD-10-CM | POA: Diagnosis not present

## 2016-11-19 DIAGNOSIS — D631 Anemia in chronic kidney disease: Secondary | ICD-10-CM | POA: Diagnosis not present

## 2016-11-19 DIAGNOSIS — Z418 Encounter for other procedures for purposes other than remedying health state: Secondary | ICD-10-CM | POA: Diagnosis not present

## 2016-11-19 DIAGNOSIS — N2581 Secondary hyperparathyroidism of renal origin: Secondary | ICD-10-CM | POA: Diagnosis not present

## 2016-11-19 DIAGNOSIS — Z7401 Bed confinement status: Secondary | ICD-10-CM | POA: Diagnosis not present

## 2016-11-19 DIAGNOSIS — E1142 Type 2 diabetes mellitus with diabetic polyneuropathy: Secondary | ICD-10-CM | POA: Diagnosis not present

## 2016-11-19 DIAGNOSIS — I10 Essential (primary) hypertension: Secondary | ICD-10-CM | POA: Diagnosis not present

## 2016-11-19 DIAGNOSIS — N186 End stage renal disease: Secondary | ICD-10-CM | POA: Diagnosis not present

## 2016-11-19 DIAGNOSIS — I482 Chronic atrial fibrillation: Secondary | ICD-10-CM | POA: Diagnosis not present

## 2016-11-19 DIAGNOSIS — T8249XA Other complication of vascular dialysis catheter, initial encounter: Secondary | ICD-10-CM | POA: Diagnosis not present

## 2016-11-20 DIAGNOSIS — N186 End stage renal disease: Secondary | ICD-10-CM | POA: Diagnosis not present

## 2016-11-20 DIAGNOSIS — R1311 Dysphagia, oral phase: Secondary | ICD-10-CM | POA: Diagnosis not present

## 2016-11-20 DIAGNOSIS — I482 Chronic atrial fibrillation: Secondary | ICD-10-CM | POA: Diagnosis not present

## 2016-11-20 DIAGNOSIS — I10 Essential (primary) hypertension: Secondary | ICD-10-CM | POA: Diagnosis not present

## 2016-11-20 DIAGNOSIS — E1142 Type 2 diabetes mellitus with diabetic polyneuropathy: Secondary | ICD-10-CM | POA: Diagnosis not present

## 2016-11-21 DIAGNOSIS — T8249XA Other complication of vascular dialysis catheter, initial encounter: Secondary | ICD-10-CM | POA: Diagnosis not present

## 2016-11-21 DIAGNOSIS — D631 Anemia in chronic kidney disease: Secondary | ICD-10-CM | POA: Diagnosis not present

## 2016-11-21 DIAGNOSIS — N186 End stage renal disease: Secondary | ICD-10-CM | POA: Diagnosis not present

## 2016-11-21 DIAGNOSIS — Z418 Encounter for other procedures for purposes other than remedying health state: Secondary | ICD-10-CM | POA: Diagnosis not present

## 2016-11-21 DIAGNOSIS — N2581 Secondary hyperparathyroidism of renal origin: Secondary | ICD-10-CM | POA: Diagnosis not present

## 2016-11-21 DIAGNOSIS — Z7401 Bed confinement status: Secondary | ICD-10-CM | POA: Diagnosis not present

## 2016-11-21 DIAGNOSIS — Z9981 Dependence on supplemental oxygen: Secondary | ICD-10-CM | POA: Diagnosis not present

## 2016-11-22 DIAGNOSIS — E1142 Type 2 diabetes mellitus with diabetic polyneuropathy: Secondary | ICD-10-CM | POA: Diagnosis not present

## 2016-11-22 DIAGNOSIS — R1311 Dysphagia, oral phase: Secondary | ICD-10-CM | POA: Diagnosis not present

## 2016-11-22 DIAGNOSIS — I482 Chronic atrial fibrillation: Secondary | ICD-10-CM | POA: Diagnosis not present

## 2016-11-22 DIAGNOSIS — I10 Essential (primary) hypertension: Secondary | ICD-10-CM | POA: Diagnosis not present

## 2016-11-22 DIAGNOSIS — N186 End stage renal disease: Secondary | ICD-10-CM | POA: Diagnosis not present

## 2016-11-23 DIAGNOSIS — N186 End stage renal disease: Secondary | ICD-10-CM | POA: Diagnosis not present

## 2016-11-23 DIAGNOSIS — Z7401 Bed confinement status: Secondary | ICD-10-CM | POA: Diagnosis not present

## 2016-11-23 DIAGNOSIS — T8249XA Other complication of vascular dialysis catheter, initial encounter: Secondary | ICD-10-CM | POA: Diagnosis not present

## 2016-11-23 DIAGNOSIS — Z9981 Dependence on supplemental oxygen: Secondary | ICD-10-CM | POA: Diagnosis not present

## 2016-11-23 DIAGNOSIS — D631 Anemia in chronic kidney disease: Secondary | ICD-10-CM | POA: Diagnosis not present

## 2016-11-23 DIAGNOSIS — N2581 Secondary hyperparathyroidism of renal origin: Secondary | ICD-10-CM | POA: Diagnosis not present

## 2016-11-23 DIAGNOSIS — Z418 Encounter for other procedures for purposes other than remedying health state: Secondary | ICD-10-CM | POA: Diagnosis not present

## 2016-11-25 DIAGNOSIS — I5022 Chronic systolic (congestive) heart failure: Secondary | ICD-10-CM | POA: Diagnosis not present

## 2016-11-25 DIAGNOSIS — D649 Anemia, unspecified: Secondary | ICD-10-CM | POA: Diagnosis not present

## 2016-11-25 DIAGNOSIS — E119 Type 2 diabetes mellitus without complications: Secondary | ICD-10-CM | POA: Diagnosis not present

## 2016-11-25 DIAGNOSIS — I10 Essential (primary) hypertension: Secondary | ICD-10-CM | POA: Diagnosis not present

## 2016-11-26 DIAGNOSIS — T8249XA Other complication of vascular dialysis catheter, initial encounter: Secondary | ICD-10-CM | POA: Diagnosis not present

## 2016-11-26 DIAGNOSIS — I10 Essential (primary) hypertension: Secondary | ICD-10-CM | POA: Diagnosis not present

## 2016-11-26 DIAGNOSIS — E1142 Type 2 diabetes mellitus with diabetic polyneuropathy: Secondary | ICD-10-CM | POA: Diagnosis not present

## 2016-11-26 DIAGNOSIS — R1311 Dysphagia, oral phase: Secondary | ICD-10-CM | POA: Diagnosis not present

## 2016-11-26 DIAGNOSIS — Z418 Encounter for other procedures for purposes other than remedying health state: Secondary | ICD-10-CM | POA: Diagnosis not present

## 2016-11-26 DIAGNOSIS — Z7401 Bed confinement status: Secondary | ICD-10-CM | POA: Diagnosis not present

## 2016-11-26 DIAGNOSIS — D631 Anemia in chronic kidney disease: Secondary | ICD-10-CM | POA: Diagnosis not present

## 2016-11-26 DIAGNOSIS — N186 End stage renal disease: Secondary | ICD-10-CM | POA: Diagnosis not present

## 2016-11-26 DIAGNOSIS — Z9981 Dependence on supplemental oxygen: Secondary | ICD-10-CM | POA: Diagnosis not present

## 2016-11-26 DIAGNOSIS — N2581 Secondary hyperparathyroidism of renal origin: Secondary | ICD-10-CM | POA: Diagnosis not present

## 2016-11-26 DIAGNOSIS — I482 Chronic atrial fibrillation: Secondary | ICD-10-CM | POA: Diagnosis not present

## 2016-11-27 DIAGNOSIS — N186 End stage renal disease: Secondary | ICD-10-CM | POA: Diagnosis not present

## 2016-11-27 DIAGNOSIS — E1142 Type 2 diabetes mellitus with diabetic polyneuropathy: Secondary | ICD-10-CM | POA: Diagnosis not present

## 2016-11-27 DIAGNOSIS — I10 Essential (primary) hypertension: Secondary | ICD-10-CM | POA: Diagnosis not present

## 2016-11-27 DIAGNOSIS — R1311 Dysphagia, oral phase: Secondary | ICD-10-CM | POA: Diagnosis not present

## 2016-11-27 DIAGNOSIS — I482 Chronic atrial fibrillation: Secondary | ICD-10-CM | POA: Diagnosis not present

## 2016-11-28 DIAGNOSIS — Z7401 Bed confinement status: Secondary | ICD-10-CM | POA: Diagnosis not present

## 2016-11-28 DIAGNOSIS — N186 End stage renal disease: Secondary | ICD-10-CM | POA: Diagnosis not present

## 2016-11-28 DIAGNOSIS — D631 Anemia in chronic kidney disease: Secondary | ICD-10-CM | POA: Diagnosis not present

## 2016-11-28 DIAGNOSIS — Z9981 Dependence on supplemental oxygen: Secondary | ICD-10-CM | POA: Diagnosis not present

## 2016-11-28 DIAGNOSIS — T8249XA Other complication of vascular dialysis catheter, initial encounter: Secondary | ICD-10-CM | POA: Diagnosis not present

## 2016-11-28 DIAGNOSIS — N2581 Secondary hyperparathyroidism of renal origin: Secondary | ICD-10-CM | POA: Diagnosis not present

## 2016-11-28 DIAGNOSIS — Z418 Encounter for other procedures for purposes other than remedying health state: Secondary | ICD-10-CM | POA: Diagnosis not present

## 2016-11-30 DIAGNOSIS — Z418 Encounter for other procedures for purposes other than remedying health state: Secondary | ICD-10-CM | POA: Diagnosis not present

## 2016-11-30 DIAGNOSIS — Z9981 Dependence on supplemental oxygen: Secondary | ICD-10-CM | POA: Diagnosis not present

## 2016-11-30 DIAGNOSIS — N2581 Secondary hyperparathyroidism of renal origin: Secondary | ICD-10-CM | POA: Diagnosis not present

## 2016-11-30 DIAGNOSIS — Z7401 Bed confinement status: Secondary | ICD-10-CM | POA: Diagnosis not present

## 2016-11-30 DIAGNOSIS — N186 End stage renal disease: Secondary | ICD-10-CM | POA: Diagnosis not present

## 2016-11-30 DIAGNOSIS — D631 Anemia in chronic kidney disease: Secondary | ICD-10-CM | POA: Diagnosis not present

## 2016-11-30 DIAGNOSIS — T8249XA Other complication of vascular dialysis catheter, initial encounter: Secondary | ICD-10-CM | POA: Diagnosis not present

## 2016-12-03 DIAGNOSIS — N2581 Secondary hyperparathyroidism of renal origin: Secondary | ICD-10-CM | POA: Diagnosis not present

## 2016-12-03 DIAGNOSIS — Z9981 Dependence on supplemental oxygen: Secondary | ICD-10-CM | POA: Diagnosis not present

## 2016-12-03 DIAGNOSIS — Z7401 Bed confinement status: Secondary | ICD-10-CM | POA: Diagnosis not present

## 2016-12-03 DIAGNOSIS — Z418 Encounter for other procedures for purposes other than remedying health state: Secondary | ICD-10-CM | POA: Diagnosis not present

## 2016-12-03 DIAGNOSIS — T8249XA Other complication of vascular dialysis catheter, initial encounter: Secondary | ICD-10-CM | POA: Diagnosis not present

## 2016-12-03 DIAGNOSIS — D631 Anemia in chronic kidney disease: Secondary | ICD-10-CM | POA: Diagnosis not present

## 2016-12-03 DIAGNOSIS — N186 End stage renal disease: Secondary | ICD-10-CM | POA: Diagnosis not present

## 2016-12-04 DIAGNOSIS — F329 Major depressive disorder, single episode, unspecified: Secondary | ICD-10-CM | POA: Diagnosis not present

## 2016-12-04 DIAGNOSIS — G4700 Insomnia, unspecified: Secondary | ICD-10-CM | POA: Diagnosis not present

## 2016-12-04 DIAGNOSIS — F419 Anxiety disorder, unspecified: Secondary | ICD-10-CM | POA: Diagnosis not present

## 2016-12-05 DIAGNOSIS — Z9981 Dependence on supplemental oxygen: Secondary | ICD-10-CM | POA: Diagnosis not present

## 2016-12-05 DIAGNOSIS — N186 End stage renal disease: Secondary | ICD-10-CM | POA: Diagnosis not present

## 2016-12-05 DIAGNOSIS — N2581 Secondary hyperparathyroidism of renal origin: Secondary | ICD-10-CM | POA: Diagnosis not present

## 2016-12-05 DIAGNOSIS — Z7401 Bed confinement status: Secondary | ICD-10-CM | POA: Diagnosis not present

## 2016-12-05 DIAGNOSIS — T8249XA Other complication of vascular dialysis catheter, initial encounter: Secondary | ICD-10-CM | POA: Diagnosis not present

## 2016-12-05 DIAGNOSIS — D631 Anemia in chronic kidney disease: Secondary | ICD-10-CM | POA: Diagnosis not present

## 2016-12-05 DIAGNOSIS — Z418 Encounter for other procedures for purposes other than remedying health state: Secondary | ICD-10-CM | POA: Diagnosis not present

## 2016-12-07 DIAGNOSIS — D631 Anemia in chronic kidney disease: Secondary | ICD-10-CM | POA: Diagnosis not present

## 2016-12-07 DIAGNOSIS — T8249XA Other complication of vascular dialysis catheter, initial encounter: Secondary | ICD-10-CM | POA: Diagnosis not present

## 2016-12-07 DIAGNOSIS — Z7401 Bed confinement status: Secondary | ICD-10-CM | POA: Diagnosis not present

## 2016-12-07 DIAGNOSIS — N186 End stage renal disease: Secondary | ICD-10-CM | POA: Diagnosis not present

## 2016-12-07 DIAGNOSIS — Z418 Encounter for other procedures for purposes other than remedying health state: Secondary | ICD-10-CM | POA: Diagnosis not present

## 2016-12-07 DIAGNOSIS — N2581 Secondary hyperparathyroidism of renal origin: Secondary | ICD-10-CM | POA: Diagnosis not present

## 2016-12-07 DIAGNOSIS — Z9981 Dependence on supplemental oxygen: Secondary | ICD-10-CM | POA: Diagnosis not present

## 2016-12-09 DIAGNOSIS — N186 End stage renal disease: Secondary | ICD-10-CM | POA: Diagnosis not present

## 2016-12-09 DIAGNOSIS — I10 Essential (primary) hypertension: Secondary | ICD-10-CM | POA: Diagnosis not present

## 2016-12-09 DIAGNOSIS — D631 Anemia in chronic kidney disease: Secondary | ICD-10-CM | POA: Diagnosis not present

## 2016-12-10 DIAGNOSIS — N186 End stage renal disease: Secondary | ICD-10-CM | POA: Diagnosis not present

## 2016-12-10 DIAGNOSIS — Z7401 Bed confinement status: Secondary | ICD-10-CM | POA: Diagnosis not present

## 2016-12-10 DIAGNOSIS — D631 Anemia in chronic kidney disease: Secondary | ICD-10-CM | POA: Diagnosis not present

## 2016-12-10 DIAGNOSIS — Z9981 Dependence on supplemental oxygen: Secondary | ICD-10-CM | POA: Diagnosis not present

## 2016-12-10 DIAGNOSIS — Z418 Encounter for other procedures for purposes other than remedying health state: Secondary | ICD-10-CM | POA: Diagnosis not present

## 2016-12-10 DIAGNOSIS — N2581 Secondary hyperparathyroidism of renal origin: Secondary | ICD-10-CM | POA: Diagnosis not present

## 2016-12-12 DIAGNOSIS — I10 Essential (primary) hypertension: Secondary | ICD-10-CM | POA: Diagnosis not present

## 2016-12-12 DIAGNOSIS — D631 Anemia in chronic kidney disease: Secondary | ICD-10-CM | POA: Diagnosis not present

## 2016-12-12 DIAGNOSIS — N186 End stage renal disease: Secondary | ICD-10-CM | POA: Diagnosis not present

## 2016-12-12 DIAGNOSIS — N2581 Secondary hyperparathyroidism of renal origin: Secondary | ICD-10-CM | POA: Diagnosis not present

## 2016-12-12 DIAGNOSIS — M109 Gout, unspecified: Secondary | ICD-10-CM | POA: Diagnosis not present

## 2016-12-12 DIAGNOSIS — Z418 Encounter for other procedures for purposes other than remedying health state: Secondary | ICD-10-CM | POA: Diagnosis not present

## 2016-12-12 DIAGNOSIS — Z9981 Dependence on supplemental oxygen: Secondary | ICD-10-CM | POA: Diagnosis not present

## 2016-12-12 DIAGNOSIS — E119 Type 2 diabetes mellitus without complications: Secondary | ICD-10-CM | POA: Diagnosis not present

## 2016-12-12 DIAGNOSIS — Z7401 Bed confinement status: Secondary | ICD-10-CM | POA: Diagnosis not present

## 2016-12-14 DIAGNOSIS — D631 Anemia in chronic kidney disease: Secondary | ICD-10-CM | POA: Diagnosis not present

## 2016-12-14 DIAGNOSIS — N2581 Secondary hyperparathyroidism of renal origin: Secondary | ICD-10-CM | POA: Diagnosis not present

## 2016-12-14 DIAGNOSIS — Z9981 Dependence on supplemental oxygen: Secondary | ICD-10-CM | POA: Diagnosis not present

## 2016-12-14 DIAGNOSIS — Z7401 Bed confinement status: Secondary | ICD-10-CM | POA: Diagnosis not present

## 2016-12-14 DIAGNOSIS — N186 End stage renal disease: Secondary | ICD-10-CM | POA: Diagnosis not present

## 2016-12-14 DIAGNOSIS — Z418 Encounter for other procedures for purposes other than remedying health state: Secondary | ICD-10-CM | POA: Diagnosis not present

## 2016-12-17 DIAGNOSIS — Z7401 Bed confinement status: Secondary | ICD-10-CM | POA: Diagnosis not present

## 2016-12-17 DIAGNOSIS — Z9981 Dependence on supplemental oxygen: Secondary | ICD-10-CM | POA: Diagnosis not present

## 2016-12-17 DIAGNOSIS — N2581 Secondary hyperparathyroidism of renal origin: Secondary | ICD-10-CM | POA: Diagnosis not present

## 2016-12-17 DIAGNOSIS — Z418 Encounter for other procedures for purposes other than remedying health state: Secondary | ICD-10-CM | POA: Diagnosis not present

## 2016-12-17 DIAGNOSIS — N186 End stage renal disease: Secondary | ICD-10-CM | POA: Diagnosis not present

## 2016-12-17 DIAGNOSIS — D631 Anemia in chronic kidney disease: Secondary | ICD-10-CM | POA: Diagnosis not present

## 2016-12-19 DIAGNOSIS — D631 Anemia in chronic kidney disease: Secondary | ICD-10-CM | POA: Diagnosis not present

## 2016-12-19 DIAGNOSIS — N2581 Secondary hyperparathyroidism of renal origin: Secondary | ICD-10-CM | POA: Diagnosis not present

## 2016-12-19 DIAGNOSIS — N186 End stage renal disease: Secondary | ICD-10-CM | POA: Diagnosis not present

## 2016-12-19 DIAGNOSIS — Z9981 Dependence on supplemental oxygen: Secondary | ICD-10-CM | POA: Diagnosis not present

## 2016-12-19 DIAGNOSIS — Z7401 Bed confinement status: Secondary | ICD-10-CM | POA: Diagnosis not present

## 2016-12-19 DIAGNOSIS — Z418 Encounter for other procedures for purposes other than remedying health state: Secondary | ICD-10-CM | POA: Diagnosis not present

## 2016-12-21 DIAGNOSIS — Z9981 Dependence on supplemental oxygen: Secondary | ICD-10-CM | POA: Diagnosis not present

## 2016-12-21 DIAGNOSIS — Z418 Encounter for other procedures for purposes other than remedying health state: Secondary | ICD-10-CM | POA: Diagnosis not present

## 2016-12-21 DIAGNOSIS — Z7401 Bed confinement status: Secondary | ICD-10-CM | POA: Diagnosis not present

## 2016-12-21 DIAGNOSIS — N186 End stage renal disease: Secondary | ICD-10-CM | POA: Diagnosis not present

## 2016-12-21 DIAGNOSIS — D631 Anemia in chronic kidney disease: Secondary | ICD-10-CM | POA: Diagnosis not present

## 2016-12-21 DIAGNOSIS — N2581 Secondary hyperparathyroidism of renal origin: Secondary | ICD-10-CM | POA: Diagnosis not present

## 2016-12-24 DIAGNOSIS — D631 Anemia in chronic kidney disease: Secondary | ICD-10-CM | POA: Diagnosis not present

## 2016-12-24 DIAGNOSIS — Z418 Encounter for other procedures for purposes other than remedying health state: Secondary | ICD-10-CM | POA: Diagnosis not present

## 2016-12-24 DIAGNOSIS — Z7401 Bed confinement status: Secondary | ICD-10-CM | POA: Diagnosis not present

## 2016-12-24 DIAGNOSIS — Z9981 Dependence on supplemental oxygen: Secondary | ICD-10-CM | POA: Diagnosis not present

## 2016-12-24 DIAGNOSIS — N186 End stage renal disease: Secondary | ICD-10-CM | POA: Diagnosis not present

## 2016-12-24 DIAGNOSIS — N2581 Secondary hyperparathyroidism of renal origin: Secondary | ICD-10-CM | POA: Diagnosis not present

## 2016-12-25 DIAGNOSIS — M545 Low back pain: Secondary | ICD-10-CM | POA: Diagnosis not present

## 2016-12-25 DIAGNOSIS — I69351 Hemiplegia and hemiparesis following cerebral infarction affecting right dominant side: Secondary | ICD-10-CM | POA: Diagnosis not present

## 2016-12-25 DIAGNOSIS — E119 Type 2 diabetes mellitus without complications: Secondary | ICD-10-CM | POA: Diagnosis not present

## 2016-12-25 DIAGNOSIS — I5022 Chronic systolic (congestive) heart failure: Secondary | ICD-10-CM | POA: Diagnosis not present

## 2016-12-26 DIAGNOSIS — N186 End stage renal disease: Secondary | ICD-10-CM | POA: Diagnosis not present

## 2016-12-26 DIAGNOSIS — Z7401 Bed confinement status: Secondary | ICD-10-CM | POA: Diagnosis not present

## 2016-12-26 DIAGNOSIS — D631 Anemia in chronic kidney disease: Secondary | ICD-10-CM | POA: Diagnosis not present

## 2016-12-26 DIAGNOSIS — N2581 Secondary hyperparathyroidism of renal origin: Secondary | ICD-10-CM | POA: Diagnosis not present

## 2016-12-26 DIAGNOSIS — Z418 Encounter for other procedures for purposes other than remedying health state: Secondary | ICD-10-CM | POA: Diagnosis not present

## 2016-12-26 DIAGNOSIS — Z9981 Dependence on supplemental oxygen: Secondary | ICD-10-CM | POA: Diagnosis not present

## 2016-12-28 DIAGNOSIS — Z418 Encounter for other procedures for purposes other than remedying health state: Secondary | ICD-10-CM | POA: Diagnosis not present

## 2016-12-28 DIAGNOSIS — D631 Anemia in chronic kidney disease: Secondary | ICD-10-CM | POA: Diagnosis not present

## 2016-12-28 DIAGNOSIS — N2581 Secondary hyperparathyroidism of renal origin: Secondary | ICD-10-CM | POA: Diagnosis not present

## 2016-12-28 DIAGNOSIS — N186 End stage renal disease: Secondary | ICD-10-CM | POA: Diagnosis not present

## 2016-12-28 DIAGNOSIS — Z7401 Bed confinement status: Secondary | ICD-10-CM | POA: Diagnosis not present

## 2016-12-28 DIAGNOSIS — Z9981 Dependence on supplemental oxygen: Secondary | ICD-10-CM | POA: Diagnosis not present

## 2016-12-31 DIAGNOSIS — Z89511 Acquired absence of right leg below knee: Secondary | ICD-10-CM | POA: Diagnosis not present

## 2016-12-31 DIAGNOSIS — I150 Renovascular hypertension: Secondary | ICD-10-CM | POA: Diagnosis not present

## 2016-12-31 DIAGNOSIS — E1122 Type 2 diabetes mellitus with diabetic chronic kidney disease: Secondary | ICD-10-CM | POA: Diagnosis not present

## 2016-12-31 DIAGNOSIS — Z9981 Dependence on supplemental oxygen: Secondary | ICD-10-CM | POA: Diagnosis not present

## 2016-12-31 DIAGNOSIS — R569 Unspecified convulsions: Secondary | ICD-10-CM | POA: Diagnosis not present

## 2016-12-31 DIAGNOSIS — J309 Allergic rhinitis, unspecified: Secondary | ICD-10-CM | POA: Diagnosis not present

## 2016-12-31 DIAGNOSIS — N2581 Secondary hyperparathyroidism of renal origin: Secondary | ICD-10-CM | POA: Diagnosis not present

## 2016-12-31 DIAGNOSIS — R4182 Altered mental status, unspecified: Secondary | ICD-10-CM | POA: Diagnosis not present

## 2016-12-31 DIAGNOSIS — I120 Hypertensive chronic kidney disease with stage 5 chronic kidney disease or end stage renal disease: Secondary | ICD-10-CM | POA: Diagnosis not present

## 2016-12-31 DIAGNOSIS — Z8673 Personal history of transient ischemic attack (TIA), and cerebral infarction without residual deficits: Secondary | ICD-10-CM | POA: Diagnosis not present

## 2016-12-31 DIAGNOSIS — Z7401 Bed confinement status: Secondary | ICD-10-CM | POA: Diagnosis not present

## 2016-12-31 DIAGNOSIS — N186 End stage renal disease: Secondary | ICD-10-CM | POA: Diagnosis not present

## 2016-12-31 DIAGNOSIS — Z992 Dependence on renal dialysis: Secondary | ICD-10-CM | POA: Diagnosis not present

## 2016-12-31 DIAGNOSIS — Z418 Encounter for other procedures for purposes other than remedying health state: Secondary | ICD-10-CM | POA: Diagnosis not present

## 2016-12-31 DIAGNOSIS — D631 Anemia in chronic kidney disease: Secondary | ICD-10-CM | POA: Diagnosis not present

## 2016-12-31 DIAGNOSIS — I739 Peripheral vascular disease, unspecified: Secondary | ICD-10-CM | POA: Diagnosis not present

## 2016-12-31 DIAGNOSIS — G464 Cerebellar stroke syndrome: Secondary | ICD-10-CM | POA: Diagnosis not present

## 2017-01-02 DIAGNOSIS — N2581 Secondary hyperparathyroidism of renal origin: Secondary | ICD-10-CM | POA: Diagnosis not present

## 2017-01-02 DIAGNOSIS — Z7401 Bed confinement status: Secondary | ICD-10-CM | POA: Diagnosis not present

## 2017-01-02 DIAGNOSIS — Z418 Encounter for other procedures for purposes other than remedying health state: Secondary | ICD-10-CM | POA: Diagnosis not present

## 2017-01-02 DIAGNOSIS — D631 Anemia in chronic kidney disease: Secondary | ICD-10-CM | POA: Diagnosis not present

## 2017-01-02 DIAGNOSIS — Z9981 Dependence on supplemental oxygen: Secondary | ICD-10-CM | POA: Diagnosis not present

## 2017-01-02 DIAGNOSIS — N186 End stage renal disease: Secondary | ICD-10-CM | POA: Diagnosis not present

## 2017-01-04 DIAGNOSIS — N186 End stage renal disease: Secondary | ICD-10-CM | POA: Diagnosis not present

## 2017-01-04 DIAGNOSIS — Z418 Encounter for other procedures for purposes other than remedying health state: Secondary | ICD-10-CM | POA: Diagnosis not present

## 2017-01-04 DIAGNOSIS — Z7401 Bed confinement status: Secondary | ICD-10-CM | POA: Diagnosis not present

## 2017-01-04 DIAGNOSIS — N2581 Secondary hyperparathyroidism of renal origin: Secondary | ICD-10-CM | POA: Diagnosis not present

## 2017-01-04 DIAGNOSIS — D631 Anemia in chronic kidney disease: Secondary | ICD-10-CM | POA: Diagnosis not present

## 2017-01-04 DIAGNOSIS — Z9981 Dependence on supplemental oxygen: Secondary | ICD-10-CM | POA: Diagnosis not present

## 2017-01-07 DIAGNOSIS — Z7401 Bed confinement status: Secondary | ICD-10-CM | POA: Diagnosis not present

## 2017-01-07 DIAGNOSIS — N186 End stage renal disease: Secondary | ICD-10-CM | POA: Diagnosis not present

## 2017-01-07 DIAGNOSIS — Z9981 Dependence on supplemental oxygen: Secondary | ICD-10-CM | POA: Diagnosis not present

## 2017-01-07 DIAGNOSIS — N2581 Secondary hyperparathyroidism of renal origin: Secondary | ICD-10-CM | POA: Diagnosis not present

## 2017-01-07 DIAGNOSIS — Z418 Encounter for other procedures for purposes other than remedying health state: Secondary | ICD-10-CM | POA: Diagnosis not present

## 2017-01-07 DIAGNOSIS — D631 Anemia in chronic kidney disease: Secondary | ICD-10-CM | POA: Diagnosis not present

## 2017-01-08 DIAGNOSIS — G4700 Insomnia, unspecified: Secondary | ICD-10-CM | POA: Diagnosis not present

## 2017-01-08 DIAGNOSIS — F329 Major depressive disorder, single episode, unspecified: Secondary | ICD-10-CM | POA: Diagnosis not present

## 2017-01-08 DIAGNOSIS — F419 Anxiety disorder, unspecified: Secondary | ICD-10-CM | POA: Diagnosis not present

## 2017-01-09 DIAGNOSIS — N186 End stage renal disease: Secondary | ICD-10-CM | POA: Diagnosis not present

## 2017-01-09 DIAGNOSIS — E119 Type 2 diabetes mellitus without complications: Secondary | ICD-10-CM | POA: Diagnosis not present

## 2017-01-09 DIAGNOSIS — Z418 Encounter for other procedures for purposes other than remedying health state: Secondary | ICD-10-CM | POA: Diagnosis not present

## 2017-01-09 DIAGNOSIS — D631 Anemia in chronic kidney disease: Secondary | ICD-10-CM | POA: Diagnosis not present

## 2017-01-09 DIAGNOSIS — N2581 Secondary hyperparathyroidism of renal origin: Secondary | ICD-10-CM | POA: Diagnosis not present

## 2017-01-09 DIAGNOSIS — I10 Essential (primary) hypertension: Secondary | ICD-10-CM | POA: Diagnosis not present

## 2017-01-09 DIAGNOSIS — T8249XA Other complication of vascular dialysis catheter, initial encounter: Secondary | ICD-10-CM | POA: Diagnosis not present

## 2017-01-09 DIAGNOSIS — Z9981 Dependence on supplemental oxygen: Secondary | ICD-10-CM | POA: Diagnosis not present

## 2017-01-09 DIAGNOSIS — M109 Gout, unspecified: Secondary | ICD-10-CM | POA: Diagnosis not present

## 2017-01-09 DIAGNOSIS — Z7401 Bed confinement status: Secondary | ICD-10-CM | POA: Diagnosis not present

## 2017-01-11 DIAGNOSIS — Z7401 Bed confinement status: Secondary | ICD-10-CM | POA: Diagnosis not present

## 2017-01-11 DIAGNOSIS — Z9981 Dependence on supplemental oxygen: Secondary | ICD-10-CM | POA: Diagnosis not present

## 2017-01-11 DIAGNOSIS — D631 Anemia in chronic kidney disease: Secondary | ICD-10-CM | POA: Diagnosis not present

## 2017-01-11 DIAGNOSIS — N186 End stage renal disease: Secondary | ICD-10-CM | POA: Diagnosis not present

## 2017-01-11 DIAGNOSIS — T8249XA Other complication of vascular dialysis catheter, initial encounter: Secondary | ICD-10-CM | POA: Diagnosis not present

## 2017-01-11 DIAGNOSIS — N2581 Secondary hyperparathyroidism of renal origin: Secondary | ICD-10-CM | POA: Diagnosis not present

## 2017-01-11 DIAGNOSIS — Z418 Encounter for other procedures for purposes other than remedying health state: Secondary | ICD-10-CM | POA: Diagnosis not present

## 2017-01-14 DIAGNOSIS — D631 Anemia in chronic kidney disease: Secondary | ICD-10-CM | POA: Diagnosis not present

## 2017-01-14 DIAGNOSIS — Z9981 Dependence on supplemental oxygen: Secondary | ICD-10-CM | POA: Diagnosis not present

## 2017-01-14 DIAGNOSIS — T8249XA Other complication of vascular dialysis catheter, initial encounter: Secondary | ICD-10-CM | POA: Diagnosis not present

## 2017-01-14 DIAGNOSIS — Z418 Encounter for other procedures for purposes other than remedying health state: Secondary | ICD-10-CM | POA: Diagnosis not present

## 2017-01-14 DIAGNOSIS — N186 End stage renal disease: Secondary | ICD-10-CM | POA: Diagnosis not present

## 2017-01-14 DIAGNOSIS — N2581 Secondary hyperparathyroidism of renal origin: Secondary | ICD-10-CM | POA: Diagnosis not present

## 2017-01-14 DIAGNOSIS — Z7401 Bed confinement status: Secondary | ICD-10-CM | POA: Diagnosis not present

## 2017-01-16 DIAGNOSIS — D631 Anemia in chronic kidney disease: Secondary | ICD-10-CM | POA: Diagnosis not present

## 2017-01-16 DIAGNOSIS — Z7401 Bed confinement status: Secondary | ICD-10-CM | POA: Diagnosis not present

## 2017-01-16 DIAGNOSIS — Z9981 Dependence on supplemental oxygen: Secondary | ICD-10-CM | POA: Diagnosis not present

## 2017-01-16 DIAGNOSIS — N2581 Secondary hyperparathyroidism of renal origin: Secondary | ICD-10-CM | POA: Diagnosis not present

## 2017-01-16 DIAGNOSIS — Z418 Encounter for other procedures for purposes other than remedying health state: Secondary | ICD-10-CM | POA: Diagnosis not present

## 2017-01-16 DIAGNOSIS — T8249XA Other complication of vascular dialysis catheter, initial encounter: Secondary | ICD-10-CM | POA: Diagnosis not present

## 2017-01-16 DIAGNOSIS — N186 End stage renal disease: Secondary | ICD-10-CM | POA: Diagnosis not present

## 2017-01-18 DIAGNOSIS — N186 End stage renal disease: Secondary | ICD-10-CM | POA: Diagnosis not present

## 2017-01-18 DIAGNOSIS — Z418 Encounter for other procedures for purposes other than remedying health state: Secondary | ICD-10-CM | POA: Diagnosis not present

## 2017-01-18 DIAGNOSIS — T8249XA Other complication of vascular dialysis catheter, initial encounter: Secondary | ICD-10-CM | POA: Diagnosis not present

## 2017-01-18 DIAGNOSIS — N2581 Secondary hyperparathyroidism of renal origin: Secondary | ICD-10-CM | POA: Diagnosis not present

## 2017-01-18 DIAGNOSIS — Z9981 Dependence on supplemental oxygen: Secondary | ICD-10-CM | POA: Diagnosis not present

## 2017-01-18 DIAGNOSIS — E1121 Type 2 diabetes mellitus with diabetic nephropathy: Secondary | ICD-10-CM | POA: Diagnosis not present

## 2017-01-18 DIAGNOSIS — Z7401 Bed confinement status: Secondary | ICD-10-CM | POA: Diagnosis not present

## 2017-01-18 DIAGNOSIS — D631 Anemia in chronic kidney disease: Secondary | ICD-10-CM | POA: Diagnosis not present

## 2017-01-21 DIAGNOSIS — N2581 Secondary hyperparathyroidism of renal origin: Secondary | ICD-10-CM | POA: Diagnosis not present

## 2017-01-21 DIAGNOSIS — Z418 Encounter for other procedures for purposes other than remedying health state: Secondary | ICD-10-CM | POA: Diagnosis not present

## 2017-01-21 DIAGNOSIS — Z9981 Dependence on supplemental oxygen: Secondary | ICD-10-CM | POA: Diagnosis not present

## 2017-01-21 DIAGNOSIS — Z7401 Bed confinement status: Secondary | ICD-10-CM | POA: Diagnosis not present

## 2017-01-21 DIAGNOSIS — T8249XA Other complication of vascular dialysis catheter, initial encounter: Secondary | ICD-10-CM | POA: Diagnosis not present

## 2017-01-21 DIAGNOSIS — N186 End stage renal disease: Secondary | ICD-10-CM | POA: Diagnosis not present

## 2017-01-21 DIAGNOSIS — D631 Anemia in chronic kidney disease: Secondary | ICD-10-CM | POA: Diagnosis not present

## 2017-01-23 DIAGNOSIS — N186 End stage renal disease: Secondary | ICD-10-CM | POA: Diagnosis not present

## 2017-01-23 DIAGNOSIS — D631 Anemia in chronic kidney disease: Secondary | ICD-10-CM | POA: Diagnosis not present

## 2017-01-23 DIAGNOSIS — Z9981 Dependence on supplemental oxygen: Secondary | ICD-10-CM | POA: Diagnosis not present

## 2017-01-23 DIAGNOSIS — Z418 Encounter for other procedures for purposes other than remedying health state: Secondary | ICD-10-CM | POA: Diagnosis not present

## 2017-01-23 DIAGNOSIS — Z7401 Bed confinement status: Secondary | ICD-10-CM | POA: Diagnosis not present

## 2017-01-23 DIAGNOSIS — T8249XA Other complication of vascular dialysis catheter, initial encounter: Secondary | ICD-10-CM | POA: Diagnosis not present

## 2017-01-23 DIAGNOSIS — N2581 Secondary hyperparathyroidism of renal origin: Secondary | ICD-10-CM | POA: Diagnosis not present

## 2017-01-25 DIAGNOSIS — Z9981 Dependence on supplemental oxygen: Secondary | ICD-10-CM | POA: Diagnosis not present

## 2017-01-25 DIAGNOSIS — N2581 Secondary hyperparathyroidism of renal origin: Secondary | ICD-10-CM | POA: Diagnosis not present

## 2017-01-25 DIAGNOSIS — T8249XA Other complication of vascular dialysis catheter, initial encounter: Secondary | ICD-10-CM | POA: Diagnosis not present

## 2017-01-25 DIAGNOSIS — Z418 Encounter for other procedures for purposes other than remedying health state: Secondary | ICD-10-CM | POA: Diagnosis not present

## 2017-01-25 DIAGNOSIS — Z7401 Bed confinement status: Secondary | ICD-10-CM | POA: Diagnosis not present

## 2017-01-25 DIAGNOSIS — N186 End stage renal disease: Secondary | ICD-10-CM | POA: Diagnosis not present

## 2017-01-25 DIAGNOSIS — D631 Anemia in chronic kidney disease: Secondary | ICD-10-CM | POA: Diagnosis not present

## 2017-01-28 DIAGNOSIS — Z418 Encounter for other procedures for purposes other than remedying health state: Secondary | ICD-10-CM | POA: Diagnosis not present

## 2017-01-28 DIAGNOSIS — N2581 Secondary hyperparathyroidism of renal origin: Secondary | ICD-10-CM | POA: Diagnosis not present

## 2017-01-28 DIAGNOSIS — Z9981 Dependence on supplemental oxygen: Secondary | ICD-10-CM | POA: Diagnosis not present

## 2017-01-28 DIAGNOSIS — Z7401 Bed confinement status: Secondary | ICD-10-CM | POA: Diagnosis not present

## 2017-01-28 DIAGNOSIS — T8249XA Other complication of vascular dialysis catheter, initial encounter: Secondary | ICD-10-CM | POA: Diagnosis not present

## 2017-01-28 DIAGNOSIS — D631 Anemia in chronic kidney disease: Secondary | ICD-10-CM | POA: Diagnosis not present

## 2017-01-28 DIAGNOSIS — N186 End stage renal disease: Secondary | ICD-10-CM | POA: Diagnosis not present

## 2017-01-30 DIAGNOSIS — N186 End stage renal disease: Secondary | ICD-10-CM | POA: Diagnosis not present

## 2017-01-30 DIAGNOSIS — Z418 Encounter for other procedures for purposes other than remedying health state: Secondary | ICD-10-CM | POA: Diagnosis not present

## 2017-01-30 DIAGNOSIS — T8249XA Other complication of vascular dialysis catheter, initial encounter: Secondary | ICD-10-CM | POA: Diagnosis not present

## 2017-01-30 DIAGNOSIS — N2581 Secondary hyperparathyroidism of renal origin: Secondary | ICD-10-CM | POA: Diagnosis not present

## 2017-01-30 DIAGNOSIS — D631 Anemia in chronic kidney disease: Secondary | ICD-10-CM | POA: Diagnosis not present

## 2017-01-30 DIAGNOSIS — Z9981 Dependence on supplemental oxygen: Secondary | ICD-10-CM | POA: Diagnosis not present

## 2017-01-30 DIAGNOSIS — Z7401 Bed confinement status: Secondary | ICD-10-CM | POA: Diagnosis not present

## 2017-02-01 DIAGNOSIS — Z9981 Dependence on supplemental oxygen: Secondary | ICD-10-CM | POA: Diagnosis not present

## 2017-02-01 DIAGNOSIS — Z7401 Bed confinement status: Secondary | ICD-10-CM | POA: Diagnosis not present

## 2017-02-01 DIAGNOSIS — D631 Anemia in chronic kidney disease: Secondary | ICD-10-CM | POA: Diagnosis not present

## 2017-02-01 DIAGNOSIS — N2581 Secondary hyperparathyroidism of renal origin: Secondary | ICD-10-CM | POA: Diagnosis not present

## 2017-02-01 DIAGNOSIS — N186 End stage renal disease: Secondary | ICD-10-CM | POA: Diagnosis not present

## 2017-02-01 DIAGNOSIS — T8249XA Other complication of vascular dialysis catheter, initial encounter: Secondary | ICD-10-CM | POA: Diagnosis not present

## 2017-02-01 DIAGNOSIS — Z418 Encounter for other procedures for purposes other than remedying health state: Secondary | ICD-10-CM | POA: Diagnosis not present

## 2017-02-04 DIAGNOSIS — N2581 Secondary hyperparathyroidism of renal origin: Secondary | ICD-10-CM | POA: Diagnosis not present

## 2017-02-04 DIAGNOSIS — R293 Abnormal posture: Secondary | ICD-10-CM | POA: Diagnosis not present

## 2017-02-04 DIAGNOSIS — Z9981 Dependence on supplemental oxygen: Secondary | ICD-10-CM | POA: Diagnosis not present

## 2017-02-04 DIAGNOSIS — T8249XA Other complication of vascular dialysis catheter, initial encounter: Secondary | ICD-10-CM | POA: Diagnosis not present

## 2017-02-04 DIAGNOSIS — N186 End stage renal disease: Secondary | ICD-10-CM | POA: Diagnosis not present

## 2017-02-04 DIAGNOSIS — I10 Essential (primary) hypertension: Secondary | ICD-10-CM | POA: Diagnosis not present

## 2017-02-04 DIAGNOSIS — Z418 Encounter for other procedures for purposes other than remedying health state: Secondary | ICD-10-CM | POA: Diagnosis not present

## 2017-02-04 DIAGNOSIS — E1142 Type 2 diabetes mellitus with diabetic polyneuropathy: Secondary | ICD-10-CM | POA: Diagnosis not present

## 2017-02-04 DIAGNOSIS — Z7401 Bed confinement status: Secondary | ICD-10-CM | POA: Diagnosis not present

## 2017-02-04 DIAGNOSIS — R1311 Dysphagia, oral phase: Secondary | ICD-10-CM | POA: Diagnosis not present

## 2017-02-04 DIAGNOSIS — D631 Anemia in chronic kidney disease: Secondary | ICD-10-CM | POA: Diagnosis not present

## 2017-02-06 DIAGNOSIS — D631 Anemia in chronic kidney disease: Secondary | ICD-10-CM | POA: Diagnosis not present

## 2017-02-06 DIAGNOSIS — T8249XA Other complication of vascular dialysis catheter, initial encounter: Secondary | ICD-10-CM | POA: Diagnosis not present

## 2017-02-06 DIAGNOSIS — R293 Abnormal posture: Secondary | ICD-10-CM | POA: Diagnosis not present

## 2017-02-06 DIAGNOSIS — Z7401 Bed confinement status: Secondary | ICD-10-CM | POA: Diagnosis not present

## 2017-02-06 DIAGNOSIS — E1142 Type 2 diabetes mellitus with diabetic polyneuropathy: Secondary | ICD-10-CM | POA: Diagnosis not present

## 2017-02-06 DIAGNOSIS — N186 End stage renal disease: Secondary | ICD-10-CM | POA: Diagnosis not present

## 2017-02-06 DIAGNOSIS — Z9981 Dependence on supplemental oxygen: Secondary | ICD-10-CM | POA: Diagnosis not present

## 2017-02-06 DIAGNOSIS — R1311 Dysphagia, oral phase: Secondary | ICD-10-CM | POA: Diagnosis not present

## 2017-02-06 DIAGNOSIS — Z418 Encounter for other procedures for purposes other than remedying health state: Secondary | ICD-10-CM | POA: Diagnosis not present

## 2017-02-06 DIAGNOSIS — N2581 Secondary hyperparathyroidism of renal origin: Secondary | ICD-10-CM | POA: Diagnosis not present

## 2017-02-06 DIAGNOSIS — I10 Essential (primary) hypertension: Secondary | ICD-10-CM | POA: Diagnosis not present

## 2017-02-07 DIAGNOSIS — E1142 Type 2 diabetes mellitus with diabetic polyneuropathy: Secondary | ICD-10-CM | POA: Diagnosis not present

## 2017-02-07 DIAGNOSIS — R1311 Dysphagia, oral phase: Secondary | ICD-10-CM | POA: Diagnosis not present

## 2017-02-07 DIAGNOSIS — R293 Abnormal posture: Secondary | ICD-10-CM | POA: Diagnosis not present

## 2017-02-07 DIAGNOSIS — I10 Essential (primary) hypertension: Secondary | ICD-10-CM | POA: Diagnosis not present

## 2017-02-08 DIAGNOSIS — Z418 Encounter for other procedures for purposes other than remedying health state: Secondary | ICD-10-CM | POA: Diagnosis not present

## 2017-02-08 DIAGNOSIS — N2581 Secondary hyperparathyroidism of renal origin: Secondary | ICD-10-CM | POA: Diagnosis not present

## 2017-02-08 DIAGNOSIS — Z7401 Bed confinement status: Secondary | ICD-10-CM | POA: Diagnosis not present

## 2017-02-08 DIAGNOSIS — Z9981 Dependence on supplemental oxygen: Secondary | ICD-10-CM | POA: Diagnosis not present

## 2017-02-08 DIAGNOSIS — N186 End stage renal disease: Secondary | ICD-10-CM | POA: Diagnosis not present

## 2017-02-08 DIAGNOSIS — T8249XA Other complication of vascular dialysis catheter, initial encounter: Secondary | ICD-10-CM | POA: Diagnosis not present

## 2017-02-08 DIAGNOSIS — D631 Anemia in chronic kidney disease: Secondary | ICD-10-CM | POA: Diagnosis not present

## 2017-02-09 DIAGNOSIS — I10 Essential (primary) hypertension: Secondary | ICD-10-CM | POA: Diagnosis not present

## 2017-02-09 DIAGNOSIS — N186 End stage renal disease: Secondary | ICD-10-CM | POA: Diagnosis not present

## 2017-02-09 DIAGNOSIS — D631 Anemia in chronic kidney disease: Secondary | ICD-10-CM | POA: Diagnosis not present

## 2017-02-11 DIAGNOSIS — Z418 Encounter for other procedures for purposes other than remedying health state: Secondary | ICD-10-CM | POA: Diagnosis not present

## 2017-02-11 DIAGNOSIS — Z9981 Dependence on supplemental oxygen: Secondary | ICD-10-CM | POA: Diagnosis not present

## 2017-02-11 DIAGNOSIS — T8249XA Other complication of vascular dialysis catheter, initial encounter: Secondary | ICD-10-CM | POA: Diagnosis not present

## 2017-02-11 DIAGNOSIS — N2581 Secondary hyperparathyroidism of renal origin: Secondary | ICD-10-CM | POA: Diagnosis not present

## 2017-02-11 DIAGNOSIS — N186 End stage renal disease: Secondary | ICD-10-CM | POA: Diagnosis not present

## 2017-02-11 DIAGNOSIS — D631 Anemia in chronic kidney disease: Secondary | ICD-10-CM | POA: Diagnosis not present

## 2017-02-11 DIAGNOSIS — Z7401 Bed confinement status: Secondary | ICD-10-CM | POA: Diagnosis not present

## 2017-02-13 DIAGNOSIS — M6281 Muscle weakness (generalized): Secondary | ICD-10-CM | POA: Diagnosis not present

## 2017-02-13 DIAGNOSIS — T8249XA Other complication of vascular dialysis catheter, initial encounter: Secondary | ICD-10-CM | POA: Diagnosis not present

## 2017-02-13 DIAGNOSIS — Z9981 Dependence on supplemental oxygen: Secondary | ICD-10-CM | POA: Diagnosis not present

## 2017-02-13 DIAGNOSIS — M150 Primary generalized (osteo)arthritis: Secondary | ICD-10-CM | POA: Diagnosis not present

## 2017-02-13 DIAGNOSIS — N2581 Secondary hyperparathyroidism of renal origin: Secondary | ICD-10-CM | POA: Diagnosis not present

## 2017-02-13 DIAGNOSIS — Z418 Encounter for other procedures for purposes other than remedying health state: Secondary | ICD-10-CM | POA: Diagnosis not present

## 2017-02-13 DIAGNOSIS — N186 End stage renal disease: Secondary | ICD-10-CM | POA: Diagnosis not present

## 2017-02-13 DIAGNOSIS — R293 Abnormal posture: Secondary | ICD-10-CM | POA: Diagnosis not present

## 2017-02-13 DIAGNOSIS — Z7401 Bed confinement status: Secondary | ICD-10-CM | POA: Diagnosis not present

## 2017-02-13 DIAGNOSIS — D631 Anemia in chronic kidney disease: Secondary | ICD-10-CM | POA: Diagnosis not present

## 2017-02-13 DIAGNOSIS — R1311 Dysphagia, oral phase: Secondary | ICD-10-CM | POA: Diagnosis not present

## 2017-02-13 DIAGNOSIS — E1142 Type 2 diabetes mellitus with diabetic polyneuropathy: Secondary | ICD-10-CM | POA: Diagnosis not present

## 2017-02-13 DIAGNOSIS — I10 Essential (primary) hypertension: Secondary | ICD-10-CM | POA: Diagnosis not present

## 2017-02-13 DIAGNOSIS — M109 Gout, unspecified: Secondary | ICD-10-CM | POA: Diagnosis not present

## 2017-02-13 DIAGNOSIS — E119 Type 2 diabetes mellitus without complications: Secondary | ICD-10-CM | POA: Diagnosis not present

## 2017-02-14 DIAGNOSIS — R293 Abnormal posture: Secondary | ICD-10-CM | POA: Diagnosis not present

## 2017-02-14 DIAGNOSIS — I10 Essential (primary) hypertension: Secondary | ICD-10-CM | POA: Diagnosis not present

## 2017-02-14 DIAGNOSIS — N186 End stage renal disease: Secondary | ICD-10-CM | POA: Diagnosis not present

## 2017-02-14 DIAGNOSIS — E1142 Type 2 diabetes mellitus with diabetic polyneuropathy: Secondary | ICD-10-CM | POA: Diagnosis not present

## 2017-02-14 DIAGNOSIS — R1311 Dysphagia, oral phase: Secondary | ICD-10-CM | POA: Diagnosis not present

## 2017-02-15 DIAGNOSIS — T8249XA Other complication of vascular dialysis catheter, initial encounter: Secondary | ICD-10-CM | POA: Diagnosis not present

## 2017-02-15 DIAGNOSIS — Z9981 Dependence on supplemental oxygen: Secondary | ICD-10-CM | POA: Diagnosis not present

## 2017-02-15 DIAGNOSIS — E1142 Type 2 diabetes mellitus with diabetic polyneuropathy: Secondary | ICD-10-CM | POA: Diagnosis not present

## 2017-02-15 DIAGNOSIS — R293 Abnormal posture: Secondary | ICD-10-CM | POA: Diagnosis not present

## 2017-02-15 DIAGNOSIS — D631 Anemia in chronic kidney disease: Secondary | ICD-10-CM | POA: Diagnosis not present

## 2017-02-15 DIAGNOSIS — E1122 Type 2 diabetes mellitus with diabetic chronic kidney disease: Secondary | ICD-10-CM | POA: Diagnosis not present

## 2017-02-15 DIAGNOSIS — M6281 Muscle weakness (generalized): Secondary | ICD-10-CM | POA: Diagnosis not present

## 2017-02-15 DIAGNOSIS — N186 End stage renal disease: Secondary | ICD-10-CM | POA: Diagnosis not present

## 2017-02-15 DIAGNOSIS — I10 Essential (primary) hypertension: Secondary | ICD-10-CM | POA: Diagnosis not present

## 2017-02-15 DIAGNOSIS — N2581 Secondary hyperparathyroidism of renal origin: Secondary | ICD-10-CM | POA: Diagnosis not present

## 2017-02-15 DIAGNOSIS — I5022 Chronic systolic (congestive) heart failure: Secondary | ICD-10-CM | POA: Diagnosis not present

## 2017-02-15 DIAGNOSIS — Z418 Encounter for other procedures for purposes other than remedying health state: Secondary | ICD-10-CM | POA: Diagnosis not present

## 2017-02-15 DIAGNOSIS — Z7401 Bed confinement status: Secondary | ICD-10-CM | POA: Diagnosis not present

## 2017-02-15 DIAGNOSIS — R1311 Dysphagia, oral phase: Secondary | ICD-10-CM | POA: Diagnosis not present

## 2017-02-18 DIAGNOSIS — T8249XA Other complication of vascular dialysis catheter, initial encounter: Secondary | ICD-10-CM | POA: Diagnosis not present

## 2017-02-18 DIAGNOSIS — I10 Essential (primary) hypertension: Secondary | ICD-10-CM | POA: Diagnosis not present

## 2017-02-18 DIAGNOSIS — R1311 Dysphagia, oral phase: Secondary | ICD-10-CM | POA: Diagnosis not present

## 2017-02-18 DIAGNOSIS — E1142 Type 2 diabetes mellitus with diabetic polyneuropathy: Secondary | ICD-10-CM | POA: Diagnosis not present

## 2017-02-18 DIAGNOSIS — Z7401 Bed confinement status: Secondary | ICD-10-CM | POA: Diagnosis not present

## 2017-02-18 DIAGNOSIS — N2581 Secondary hyperparathyroidism of renal origin: Secondary | ICD-10-CM | POA: Diagnosis not present

## 2017-02-18 DIAGNOSIS — N186 End stage renal disease: Secondary | ICD-10-CM | POA: Diagnosis not present

## 2017-02-18 DIAGNOSIS — Z9981 Dependence on supplemental oxygen: Secondary | ICD-10-CM | POA: Diagnosis not present

## 2017-02-18 DIAGNOSIS — Z418 Encounter for other procedures for purposes other than remedying health state: Secondary | ICD-10-CM | POA: Diagnosis not present

## 2017-02-18 DIAGNOSIS — D631 Anemia in chronic kidney disease: Secondary | ICD-10-CM | POA: Diagnosis not present

## 2017-02-18 DIAGNOSIS — R293 Abnormal posture: Secondary | ICD-10-CM | POA: Diagnosis not present

## 2017-02-19 DIAGNOSIS — N186 End stage renal disease: Secondary | ICD-10-CM | POA: Diagnosis not present

## 2017-02-19 DIAGNOSIS — I10 Essential (primary) hypertension: Secondary | ICD-10-CM | POA: Diagnosis not present

## 2017-02-19 DIAGNOSIS — E1142 Type 2 diabetes mellitus with diabetic polyneuropathy: Secondary | ICD-10-CM | POA: Diagnosis not present

## 2017-02-19 DIAGNOSIS — R293 Abnormal posture: Secondary | ICD-10-CM | POA: Diagnosis not present

## 2017-02-19 DIAGNOSIS — R1311 Dysphagia, oral phase: Secondary | ICD-10-CM | POA: Diagnosis not present

## 2017-02-20 DIAGNOSIS — H2513 Age-related nuclear cataract, bilateral: Secondary | ICD-10-CM | POA: Diagnosis not present

## 2017-02-20 DIAGNOSIS — N186 End stage renal disease: Secondary | ICD-10-CM | POA: Diagnosis not present

## 2017-02-20 DIAGNOSIS — Z7401 Bed confinement status: Secondary | ICD-10-CM | POA: Diagnosis not present

## 2017-02-20 DIAGNOSIS — T8249XA Other complication of vascular dialysis catheter, initial encounter: Secondary | ICD-10-CM | POA: Diagnosis not present

## 2017-02-20 DIAGNOSIS — N2581 Secondary hyperparathyroidism of renal origin: Secondary | ICD-10-CM | POA: Diagnosis not present

## 2017-02-20 DIAGNOSIS — E113553 Type 2 diabetes mellitus with stable proliferative diabetic retinopathy, bilateral: Secondary | ICD-10-CM | POA: Diagnosis not present

## 2017-02-20 DIAGNOSIS — D631 Anemia in chronic kidney disease: Secondary | ICD-10-CM | POA: Diagnosis not present

## 2017-02-20 DIAGNOSIS — Z9981 Dependence on supplemental oxygen: Secondary | ICD-10-CM | POA: Diagnosis not present

## 2017-02-20 DIAGNOSIS — Z418 Encounter for other procedures for purposes other than remedying health state: Secondary | ICD-10-CM | POA: Diagnosis not present

## 2017-02-21 DIAGNOSIS — R1311 Dysphagia, oral phase: Secondary | ICD-10-CM | POA: Diagnosis not present

## 2017-02-21 DIAGNOSIS — N186 End stage renal disease: Secondary | ICD-10-CM | POA: Diagnosis not present

## 2017-02-21 DIAGNOSIS — R293 Abnormal posture: Secondary | ICD-10-CM | POA: Diagnosis not present

## 2017-02-21 DIAGNOSIS — I10 Essential (primary) hypertension: Secondary | ICD-10-CM | POA: Diagnosis not present

## 2017-02-21 DIAGNOSIS — E1142 Type 2 diabetes mellitus with diabetic polyneuropathy: Secondary | ICD-10-CM | POA: Diagnosis not present

## 2017-02-22 DIAGNOSIS — N2581 Secondary hyperparathyroidism of renal origin: Secondary | ICD-10-CM | POA: Diagnosis not present

## 2017-02-22 DIAGNOSIS — T8249XA Other complication of vascular dialysis catheter, initial encounter: Secondary | ICD-10-CM | POA: Diagnosis not present

## 2017-02-22 DIAGNOSIS — Z9981 Dependence on supplemental oxygen: Secondary | ICD-10-CM | POA: Diagnosis not present

## 2017-02-22 DIAGNOSIS — N186 End stage renal disease: Secondary | ICD-10-CM | POA: Diagnosis not present

## 2017-02-22 DIAGNOSIS — Z418 Encounter for other procedures for purposes other than remedying health state: Secondary | ICD-10-CM | POA: Diagnosis not present

## 2017-02-22 DIAGNOSIS — Z7401 Bed confinement status: Secondary | ICD-10-CM | POA: Diagnosis not present

## 2017-02-22 DIAGNOSIS — D631 Anemia in chronic kidney disease: Secondary | ICD-10-CM | POA: Diagnosis not present

## 2017-02-25 DIAGNOSIS — N186 End stage renal disease: Secondary | ICD-10-CM | POA: Diagnosis not present

## 2017-02-25 DIAGNOSIS — I10 Essential (primary) hypertension: Secondary | ICD-10-CM | POA: Diagnosis not present

## 2017-02-25 DIAGNOSIS — N2581 Secondary hyperparathyroidism of renal origin: Secondary | ICD-10-CM | POA: Diagnosis not present

## 2017-02-25 DIAGNOSIS — Z9981 Dependence on supplemental oxygen: Secondary | ICD-10-CM | POA: Diagnosis not present

## 2017-02-25 DIAGNOSIS — T8249XA Other complication of vascular dialysis catheter, initial encounter: Secondary | ICD-10-CM | POA: Diagnosis not present

## 2017-02-25 DIAGNOSIS — R293 Abnormal posture: Secondary | ICD-10-CM | POA: Diagnosis not present

## 2017-02-25 DIAGNOSIS — Z7401 Bed confinement status: Secondary | ICD-10-CM | POA: Diagnosis not present

## 2017-02-25 DIAGNOSIS — D631 Anemia in chronic kidney disease: Secondary | ICD-10-CM | POA: Diagnosis not present

## 2017-02-25 DIAGNOSIS — Z418 Encounter for other procedures for purposes other than remedying health state: Secondary | ICD-10-CM | POA: Diagnosis not present

## 2017-02-25 DIAGNOSIS — E1142 Type 2 diabetes mellitus with diabetic polyneuropathy: Secondary | ICD-10-CM | POA: Diagnosis not present

## 2017-02-25 DIAGNOSIS — R1311 Dysphagia, oral phase: Secondary | ICD-10-CM | POA: Diagnosis not present

## 2017-02-27 DIAGNOSIS — R293 Abnormal posture: Secondary | ICD-10-CM | POA: Diagnosis not present

## 2017-02-27 DIAGNOSIS — T8249XA Other complication of vascular dialysis catheter, initial encounter: Secondary | ICD-10-CM | POA: Diagnosis not present

## 2017-02-27 DIAGNOSIS — E1142 Type 2 diabetes mellitus with diabetic polyneuropathy: Secondary | ICD-10-CM | POA: Diagnosis not present

## 2017-02-27 DIAGNOSIS — Z418 Encounter for other procedures for purposes other than remedying health state: Secondary | ICD-10-CM | POA: Diagnosis not present

## 2017-02-27 DIAGNOSIS — N2581 Secondary hyperparathyroidism of renal origin: Secondary | ICD-10-CM | POA: Diagnosis not present

## 2017-02-27 DIAGNOSIS — R1311 Dysphagia, oral phase: Secondary | ICD-10-CM | POA: Diagnosis not present

## 2017-02-27 DIAGNOSIS — D631 Anemia in chronic kidney disease: Secondary | ICD-10-CM | POA: Diagnosis not present

## 2017-02-27 DIAGNOSIS — I10 Essential (primary) hypertension: Secondary | ICD-10-CM | POA: Diagnosis not present

## 2017-02-27 DIAGNOSIS — N186 End stage renal disease: Secondary | ICD-10-CM | POA: Diagnosis not present

## 2017-02-27 DIAGNOSIS — Z9981 Dependence on supplemental oxygen: Secondary | ICD-10-CM | POA: Diagnosis not present

## 2017-02-27 DIAGNOSIS — Z7401 Bed confinement status: Secondary | ICD-10-CM | POA: Diagnosis not present

## 2017-02-28 DIAGNOSIS — R1311 Dysphagia, oral phase: Secondary | ICD-10-CM | POA: Diagnosis not present

## 2017-02-28 DIAGNOSIS — N186 End stage renal disease: Secondary | ICD-10-CM | POA: Diagnosis not present

## 2017-02-28 DIAGNOSIS — R293 Abnormal posture: Secondary | ICD-10-CM | POA: Diagnosis not present

## 2017-02-28 DIAGNOSIS — I10 Essential (primary) hypertension: Secondary | ICD-10-CM | POA: Diagnosis not present

## 2017-02-28 DIAGNOSIS — E1142 Type 2 diabetes mellitus with diabetic polyneuropathy: Secondary | ICD-10-CM | POA: Diagnosis not present

## 2017-03-01 DIAGNOSIS — Z7401 Bed confinement status: Secondary | ICD-10-CM | POA: Diagnosis not present

## 2017-03-01 DIAGNOSIS — Z418 Encounter for other procedures for purposes other than remedying health state: Secondary | ICD-10-CM | POA: Diagnosis not present

## 2017-03-01 DIAGNOSIS — Z9981 Dependence on supplemental oxygen: Secondary | ICD-10-CM | POA: Diagnosis not present

## 2017-03-01 DIAGNOSIS — T8249XA Other complication of vascular dialysis catheter, initial encounter: Secondary | ICD-10-CM | POA: Diagnosis not present

## 2017-03-01 DIAGNOSIS — D631 Anemia in chronic kidney disease: Secondary | ICD-10-CM | POA: Diagnosis not present

## 2017-03-01 DIAGNOSIS — N186 End stage renal disease: Secondary | ICD-10-CM | POA: Diagnosis not present

## 2017-03-01 DIAGNOSIS — N2581 Secondary hyperparathyroidism of renal origin: Secondary | ICD-10-CM | POA: Diagnosis not present

## 2017-03-04 DIAGNOSIS — Z418 Encounter for other procedures for purposes other than remedying health state: Secondary | ICD-10-CM | POA: Diagnosis not present

## 2017-03-04 DIAGNOSIS — D631 Anemia in chronic kidney disease: Secondary | ICD-10-CM | POA: Diagnosis not present

## 2017-03-04 DIAGNOSIS — R1311 Dysphagia, oral phase: Secondary | ICD-10-CM | POA: Diagnosis not present

## 2017-03-04 DIAGNOSIS — E1142 Type 2 diabetes mellitus with diabetic polyneuropathy: Secondary | ICD-10-CM | POA: Diagnosis not present

## 2017-03-04 DIAGNOSIS — T8249XA Other complication of vascular dialysis catheter, initial encounter: Secondary | ICD-10-CM | POA: Diagnosis not present

## 2017-03-04 DIAGNOSIS — Z9981 Dependence on supplemental oxygen: Secondary | ICD-10-CM | POA: Diagnosis not present

## 2017-03-04 DIAGNOSIS — Z7409 Other reduced mobility: Secondary | ICD-10-CM | POA: Diagnosis not present

## 2017-03-04 DIAGNOSIS — N2581 Secondary hyperparathyroidism of renal origin: Secondary | ICD-10-CM | POA: Diagnosis not present

## 2017-03-04 DIAGNOSIS — Z7401 Bed confinement status: Secondary | ICD-10-CM | POA: Diagnosis not present

## 2017-03-04 DIAGNOSIS — R293 Abnormal posture: Secondary | ICD-10-CM | POA: Diagnosis not present

## 2017-03-04 DIAGNOSIS — N186 End stage renal disease: Secondary | ICD-10-CM | POA: Diagnosis not present

## 2017-03-04 DIAGNOSIS — I10 Essential (primary) hypertension: Secondary | ICD-10-CM | POA: Diagnosis not present

## 2017-03-06 DIAGNOSIS — E1142 Type 2 diabetes mellitus with diabetic polyneuropathy: Secondary | ICD-10-CM | POA: Diagnosis not present

## 2017-03-06 DIAGNOSIS — Z7401 Bed confinement status: Secondary | ICD-10-CM | POA: Diagnosis not present

## 2017-03-06 DIAGNOSIS — N186 End stage renal disease: Secondary | ICD-10-CM | POA: Diagnosis not present

## 2017-03-06 DIAGNOSIS — N2581 Secondary hyperparathyroidism of renal origin: Secondary | ICD-10-CM | POA: Diagnosis not present

## 2017-03-06 DIAGNOSIS — I10 Essential (primary) hypertension: Secondary | ICD-10-CM | POA: Diagnosis not present

## 2017-03-06 DIAGNOSIS — R1311 Dysphagia, oral phase: Secondary | ICD-10-CM | POA: Diagnosis not present

## 2017-03-06 DIAGNOSIS — Z418 Encounter for other procedures for purposes other than remedying health state: Secondary | ICD-10-CM | POA: Diagnosis not present

## 2017-03-06 DIAGNOSIS — R293 Abnormal posture: Secondary | ICD-10-CM | POA: Diagnosis not present

## 2017-03-06 DIAGNOSIS — T8249XA Other complication of vascular dialysis catheter, initial encounter: Secondary | ICD-10-CM | POA: Diagnosis not present

## 2017-03-06 DIAGNOSIS — D631 Anemia in chronic kidney disease: Secondary | ICD-10-CM | POA: Diagnosis not present

## 2017-03-06 DIAGNOSIS — Z9981 Dependence on supplemental oxygen: Secondary | ICD-10-CM | POA: Diagnosis not present

## 2017-03-07 DIAGNOSIS — R7881 Bacteremia: Secondary | ICD-10-CM | POA: Diagnosis not present

## 2017-03-07 DIAGNOSIS — N185 Chronic kidney disease, stage 5: Secondary | ICD-10-CM | POA: Diagnosis not present

## 2017-03-07 DIAGNOSIS — I132 Hypertensive heart and chronic kidney disease with heart failure and with stage 5 chronic kidney disease, or end stage renal disease: Secondary | ICD-10-CM | POA: Diagnosis not present

## 2017-03-07 DIAGNOSIS — T80211A Bloodstream infection due to central venous catheter, initial encounter: Secondary | ICD-10-CM | POA: Diagnosis not present

## 2017-03-07 DIAGNOSIS — E875 Hyperkalemia: Secondary | ICD-10-CM | POA: Diagnosis not present

## 2017-03-07 DIAGNOSIS — I120 Hypertensive chronic kidney disease with stage 5 chronic kidney disease or end stage renal disease: Secondary | ICD-10-CM | POA: Diagnosis not present

## 2017-03-07 DIAGNOSIS — R531 Weakness: Secondary | ICD-10-CM | POA: Diagnosis not present

## 2017-03-07 DIAGNOSIS — N186 End stage renal disease: Secondary | ICD-10-CM | POA: Diagnosis not present

## 2017-03-07 DIAGNOSIS — A4181 Sepsis due to Enterococcus: Secondary | ICD-10-CM | POA: Diagnosis not present

## 2017-03-07 DIAGNOSIS — N390 Urinary tract infection, site not specified: Secondary | ICD-10-CM | POA: Diagnosis not present

## 2017-03-07 DIAGNOSIS — I69354 Hemiplegia and hemiparesis following cerebral infarction affecting left non-dominant side: Secondary | ICD-10-CM | POA: Diagnosis not present

## 2017-03-07 DIAGNOSIS — R918 Other nonspecific abnormal finding of lung field: Secondary | ICD-10-CM | POA: Diagnosis not present

## 2017-03-07 DIAGNOSIS — K047 Periapical abscess without sinus: Secondary | ICD-10-CM | POA: Diagnosis not present

## 2017-03-07 DIAGNOSIS — R404 Transient alteration of awareness: Secondary | ICD-10-CM | POA: Diagnosis not present

## 2017-03-07 DIAGNOSIS — R220 Localized swelling, mass and lump, head: Secondary | ICD-10-CM | POA: Diagnosis not present

## 2017-03-08 DIAGNOSIS — T827XXA Infection and inflammatory reaction due to other cardiac and vascular devices, implants and grafts, initial encounter: Secondary | ICD-10-CM | POA: Diagnosis not present

## 2017-03-08 DIAGNOSIS — A4189 Other specified sepsis: Secondary | ICD-10-CM | POA: Diagnosis not present

## 2017-03-08 DIAGNOSIS — M272 Inflammatory conditions of jaws: Secondary | ICD-10-CM | POA: Diagnosis present

## 2017-03-08 DIAGNOSIS — E213 Hyperparathyroidism, unspecified: Secondary | ICD-10-CM | POA: Diagnosis present

## 2017-03-08 DIAGNOSIS — J3489 Other specified disorders of nose and nasal sinuses: Secondary | ICD-10-CM | POA: Diagnosis not present

## 2017-03-08 DIAGNOSIS — K219 Gastro-esophageal reflux disease without esophagitis: Secondary | ICD-10-CM | POA: Diagnosis present

## 2017-03-08 DIAGNOSIS — Z992 Dependence on renal dialysis: Secondary | ICD-10-CM | POA: Diagnosis not present

## 2017-03-08 DIAGNOSIS — Z89612 Acquired absence of left leg above knee: Secondary | ICD-10-CM | POA: Diagnosis not present

## 2017-03-08 DIAGNOSIS — R4182 Altered mental status, unspecified: Secondary | ICD-10-CM | POA: Diagnosis not present

## 2017-03-08 DIAGNOSIS — K029 Dental caries, unspecified: Secondary | ICD-10-CM | POA: Diagnosis present

## 2017-03-08 DIAGNOSIS — I132 Hypertensive heart and chronic kidney disease with heart failure and with stage 5 chronic kidney disease, or end stage renal disease: Secondary | ICD-10-CM | POA: Diagnosis present

## 2017-03-08 DIAGNOSIS — N185 Chronic kidney disease, stage 5: Secondary | ICD-10-CM | POA: Diagnosis not present

## 2017-03-08 DIAGNOSIS — B9689 Other specified bacterial agents as the cause of diseases classified elsewhere: Secondary | ICD-10-CM | POA: Diagnosis not present

## 2017-03-08 DIAGNOSIS — I10 Essential (primary) hypertension: Secondary | ICD-10-CM | POA: Diagnosis not present

## 2017-03-08 DIAGNOSIS — H548 Legal blindness, as defined in USA: Secondary | ICD-10-CM | POA: Diagnosis present

## 2017-03-08 DIAGNOSIS — T80211A Bloodstream infection due to central venous catheter, initial encounter: Secondary | ICD-10-CM | POA: Diagnosis present

## 2017-03-08 DIAGNOSIS — R569 Unspecified convulsions: Secondary | ICD-10-CM | POA: Diagnosis present

## 2017-03-08 DIAGNOSIS — I120 Hypertensive chronic kidney disease with stage 5 chronic kidney disease or end stage renal disease: Secondary | ICD-10-CM | POA: Diagnosis not present

## 2017-03-08 DIAGNOSIS — R930 Abnormal findings on diagnostic imaging of skull and head, not elsewhere classified: Secondary | ICD-10-CM | POA: Diagnosis not present

## 2017-03-08 DIAGNOSIS — A411 Sepsis due to other specified staphylococcus: Secondary | ICD-10-CM | POA: Diagnosis not present

## 2017-03-08 DIAGNOSIS — Z8669 Personal history of other diseases of the nervous system and sense organs: Secondary | ICD-10-CM | POA: Diagnosis not present

## 2017-03-08 DIAGNOSIS — Z515 Encounter for palliative care: Secondary | ICD-10-CM | POA: Diagnosis not present

## 2017-03-08 DIAGNOSIS — N390 Urinary tract infection, site not specified: Secondary | ICD-10-CM | POA: Diagnosis present

## 2017-03-08 DIAGNOSIS — N186 End stage renal disease: Secondary | ICD-10-CM | POA: Diagnosis present

## 2017-03-08 DIAGNOSIS — M24542 Contracture, left hand: Secondary | ICD-10-CM | POA: Diagnosis not present

## 2017-03-08 DIAGNOSIS — Z8701 Personal history of pneumonia (recurrent): Secondary | ICD-10-CM | POA: Diagnosis not present

## 2017-03-08 DIAGNOSIS — A4181 Sepsis due to Enterococcus: Secondary | ICD-10-CM | POA: Diagnosis present

## 2017-03-08 DIAGNOSIS — Z9981 Dependence on supplemental oxygen: Secondary | ICD-10-CM | POA: Diagnosis not present

## 2017-03-08 DIAGNOSIS — J449 Chronic obstructive pulmonary disease, unspecified: Secondary | ICD-10-CM | POA: Diagnosis present

## 2017-03-08 DIAGNOSIS — I509 Heart failure, unspecified: Secondary | ICD-10-CM | POA: Diagnosis present

## 2017-03-08 DIAGNOSIS — E1122 Type 2 diabetes mellitus with diabetic chronic kidney disease: Secondary | ICD-10-CM | POA: Diagnosis present

## 2017-03-08 DIAGNOSIS — Z89511 Acquired absence of right leg below knee: Secondary | ICD-10-CM | POA: Diagnosis not present

## 2017-03-08 DIAGNOSIS — E875 Hyperkalemia: Secondary | ICD-10-CM | POA: Diagnosis present

## 2017-03-08 DIAGNOSIS — T827XXD Infection and inflammatory reaction due to other cardiac and vascular devices, implants and grafts, subsequent encounter: Secondary | ICD-10-CM | POA: Diagnosis not present

## 2017-03-08 DIAGNOSIS — Z6841 Body Mass Index (BMI) 40.0 and over, adult: Secondary | ICD-10-CM | POA: Diagnosis not present

## 2017-03-08 DIAGNOSIS — K047 Periapical abscess without sinus: Secondary | ICD-10-CM | POA: Diagnosis not present

## 2017-03-08 DIAGNOSIS — Z7982 Long term (current) use of aspirin: Secondary | ICD-10-CM | POA: Diagnosis not present

## 2017-03-08 DIAGNOSIS — R7881 Bacteremia: Secondary | ICD-10-CM | POA: Diagnosis not present

## 2017-03-08 DIAGNOSIS — R220 Localized swelling, mass and lump, head: Secondary | ICD-10-CM | POA: Diagnosis not present

## 2017-03-08 DIAGNOSIS — I69354 Hemiplegia and hemiparesis following cerebral infarction affecting left non-dominant side: Secondary | ICD-10-CM | POA: Diagnosis not present

## 2017-03-08 DIAGNOSIS — R9431 Abnormal electrocardiogram [ECG] [EKG]: Secondary | ICD-10-CM | POA: Diagnosis not present

## 2017-03-08 DIAGNOSIS — D649 Anemia, unspecified: Secondary | ICD-10-CM | POA: Diagnosis not present

## 2017-03-08 DIAGNOSIS — M6281 Muscle weakness (generalized): Secondary | ICD-10-CM | POA: Diagnosis not present

## 2017-03-08 DIAGNOSIS — R5381 Other malaise: Secondary | ICD-10-CM | POA: Diagnosis present

## 2017-03-08 DIAGNOSIS — R41841 Cognitive communication deficit: Secondary | ICD-10-CM | POA: Diagnosis not present

## 2017-03-08 DIAGNOSIS — Z87440 Personal history of urinary (tract) infections: Secondary | ICD-10-CM | POA: Diagnosis not present

## 2017-03-08 DIAGNOSIS — Z4901 Encounter for fitting and adjustment of extracorporeal dialysis catheter: Secondary | ICD-10-CM | POA: Diagnosis not present

## 2017-03-08 DIAGNOSIS — G40909 Epilepsy, unspecified, not intractable, without status epilepticus: Secondary | ICD-10-CM | POA: Diagnosis not present

## 2017-03-08 DIAGNOSIS — D631 Anemia in chronic kidney disease: Secondary | ICD-10-CM | POA: Diagnosis not present

## 2017-03-11 DIAGNOSIS — D631 Anemia in chronic kidney disease: Secondary | ICD-10-CM | POA: Diagnosis not present

## 2017-03-11 DIAGNOSIS — N186 End stage renal disease: Secondary | ICD-10-CM | POA: Diagnosis not present

## 2017-03-11 DIAGNOSIS — I10 Essential (primary) hypertension: Secondary | ICD-10-CM | POA: Diagnosis not present

## 2017-03-13 DIAGNOSIS — I4891 Unspecified atrial fibrillation: Secondary | ICD-10-CM | POA: Diagnosis not present

## 2017-03-13 DIAGNOSIS — Z992 Dependence on renal dialysis: Secondary | ICD-10-CM | POA: Diagnosis not present

## 2017-03-13 DIAGNOSIS — T827XXA Infection and inflammatory reaction due to other cardiac and vascular devices, implants and grafts, initial encounter: Secondary | ICD-10-CM | POA: Diagnosis not present

## 2017-03-13 DIAGNOSIS — T8249XA Other complication of vascular dialysis catheter, initial encounter: Secondary | ICD-10-CM | POA: Diagnosis not present

## 2017-03-13 DIAGNOSIS — Z7401 Bed confinement status: Secondary | ICD-10-CM | POA: Diagnosis not present

## 2017-03-13 DIAGNOSIS — D631 Anemia in chronic kidney disease: Secondary | ICD-10-CM | POA: Diagnosis not present

## 2017-03-13 DIAGNOSIS — I429 Cardiomyopathy, unspecified: Secondary | ICD-10-CM | POA: Diagnosis not present

## 2017-03-13 DIAGNOSIS — N186 End stage renal disease: Secondary | ICD-10-CM | POA: Diagnosis not present

## 2017-03-13 DIAGNOSIS — R4182 Altered mental status, unspecified: Secondary | ICD-10-CM | POA: Diagnosis not present

## 2017-03-13 DIAGNOSIS — J3489 Other specified disorders of nose and nasal sinuses: Secondary | ICD-10-CM | POA: Diagnosis not present

## 2017-03-13 DIAGNOSIS — M6281 Muscle weakness (generalized): Secondary | ICD-10-CM | POA: Diagnosis not present

## 2017-03-13 DIAGNOSIS — J339 Nasal polyp, unspecified: Secondary | ICD-10-CM | POA: Diagnosis not present

## 2017-03-13 DIAGNOSIS — Z9981 Dependence on supplemental oxygen: Secondary | ICD-10-CM | POA: Diagnosis not present

## 2017-03-13 DIAGNOSIS — A4189 Other specified sepsis: Secondary | ICD-10-CM | POA: Diagnosis not present

## 2017-03-13 DIAGNOSIS — I5022 Chronic systolic (congestive) heart failure: Secondary | ICD-10-CM | POA: Diagnosis not present

## 2017-03-13 DIAGNOSIS — M150 Primary generalized (osteo)arthritis: Secondary | ICD-10-CM | POA: Diagnosis not present

## 2017-03-13 DIAGNOSIS — I739 Peripheral vascular disease, unspecified: Secondary | ICD-10-CM | POA: Diagnosis not present

## 2017-03-13 DIAGNOSIS — S0992XA Unspecified injury of nose, initial encounter: Secondary | ICD-10-CM | POA: Diagnosis not present

## 2017-03-13 DIAGNOSIS — T827XXD Infection and inflammatory reaction due to other cardiac and vascular devices, implants and grafts, subsequent encounter: Secondary | ICD-10-CM | POA: Diagnosis not present

## 2017-03-13 DIAGNOSIS — Z7409 Other reduced mobility: Secondary | ICD-10-CM | POA: Diagnosis not present

## 2017-03-13 DIAGNOSIS — R7881 Bacteremia: Secondary | ICD-10-CM | POA: Diagnosis not present

## 2017-03-13 DIAGNOSIS — I10 Essential (primary) hypertension: Secondary | ICD-10-CM | POA: Diagnosis not present

## 2017-03-13 DIAGNOSIS — M109 Gout, unspecified: Secondary | ICD-10-CM | POA: Diagnosis not present

## 2017-03-13 DIAGNOSIS — E1122 Type 2 diabetes mellitus with diabetic chronic kidney disease: Secondary | ICD-10-CM | POA: Diagnosis not present

## 2017-03-13 DIAGNOSIS — I639 Cerebral infarction, unspecified: Secondary | ICD-10-CM | POA: Diagnosis not present

## 2017-03-13 DIAGNOSIS — A4181 Sepsis due to Enterococcus: Secondary | ICD-10-CM | POA: Diagnosis not present

## 2017-03-13 DIAGNOSIS — Z418 Encounter for other procedures for purposes other than remedying health state: Secondary | ICD-10-CM | POA: Diagnosis not present

## 2017-03-13 DIAGNOSIS — A419 Sepsis, unspecified organism: Secondary | ICD-10-CM | POA: Diagnosis not present

## 2017-03-13 DIAGNOSIS — R41841 Cognitive communication deficit: Secondary | ICD-10-CM | POA: Diagnosis not present

## 2017-03-13 DIAGNOSIS — J329 Chronic sinusitis, unspecified: Secondary | ICD-10-CM | POA: Diagnosis not present

## 2017-03-13 DIAGNOSIS — N2581 Secondary hyperparathyroidism of renal origin: Secondary | ICD-10-CM | POA: Diagnosis not present

## 2017-03-13 DIAGNOSIS — M24542 Contracture, left hand: Secondary | ICD-10-CM | POA: Diagnosis not present

## 2017-03-13 DIAGNOSIS — M2450 Contracture, unspecified joint: Secondary | ICD-10-CM | POA: Diagnosis not present

## 2017-03-13 DIAGNOSIS — T80219A Unspecified infection due to central venous catheter, initial encounter: Secondary | ICD-10-CM | POA: Diagnosis not present

## 2017-03-13 DIAGNOSIS — E119 Type 2 diabetes mellitus without complications: Secondary | ICD-10-CM | POA: Diagnosis not present

## 2017-03-15 DIAGNOSIS — Z418 Encounter for other procedures for purposes other than remedying health state: Secondary | ICD-10-CM | POA: Diagnosis not present

## 2017-03-15 DIAGNOSIS — N186 End stage renal disease: Secondary | ICD-10-CM | POA: Diagnosis not present

## 2017-03-15 DIAGNOSIS — T80219A Unspecified infection due to central venous catheter, initial encounter: Secondary | ICD-10-CM | POA: Diagnosis not present

## 2017-03-15 DIAGNOSIS — R7881 Bacteremia: Secondary | ICD-10-CM | POA: Diagnosis not present

## 2017-03-15 DIAGNOSIS — N2581 Secondary hyperparathyroidism of renal origin: Secondary | ICD-10-CM | POA: Diagnosis not present

## 2017-03-15 DIAGNOSIS — D631 Anemia in chronic kidney disease: Secondary | ICD-10-CM | POA: Diagnosis not present

## 2017-03-18 DIAGNOSIS — N186 End stage renal disease: Secondary | ICD-10-CM | POA: Diagnosis not present

## 2017-03-18 DIAGNOSIS — Z418 Encounter for other procedures for purposes other than remedying health state: Secondary | ICD-10-CM | POA: Diagnosis not present

## 2017-03-18 DIAGNOSIS — D631 Anemia in chronic kidney disease: Secondary | ICD-10-CM | POA: Diagnosis not present

## 2017-03-18 DIAGNOSIS — R7881 Bacteremia: Secondary | ICD-10-CM | POA: Diagnosis not present

## 2017-03-18 DIAGNOSIS — N2581 Secondary hyperparathyroidism of renal origin: Secondary | ICD-10-CM | POA: Diagnosis not present

## 2017-03-18 DIAGNOSIS — T80219A Unspecified infection due to central venous catheter, initial encounter: Secondary | ICD-10-CM | POA: Diagnosis not present

## 2017-03-19 DIAGNOSIS — A419 Sepsis, unspecified organism: Secondary | ICD-10-CM | POA: Diagnosis not present

## 2017-03-19 DIAGNOSIS — I10 Essential (primary) hypertension: Secondary | ICD-10-CM | POA: Diagnosis not present

## 2017-03-19 DIAGNOSIS — N186 End stage renal disease: Secondary | ICD-10-CM | POA: Diagnosis not present

## 2017-03-19 DIAGNOSIS — I739 Peripheral vascular disease, unspecified: Secondary | ICD-10-CM | POA: Diagnosis not present

## 2017-03-20 DIAGNOSIS — T80219A Unspecified infection due to central venous catheter, initial encounter: Secondary | ICD-10-CM | POA: Diagnosis not present

## 2017-03-20 DIAGNOSIS — D631 Anemia in chronic kidney disease: Secondary | ICD-10-CM | POA: Diagnosis not present

## 2017-03-20 DIAGNOSIS — N186 End stage renal disease: Secondary | ICD-10-CM | POA: Diagnosis not present

## 2017-03-20 DIAGNOSIS — Z418 Encounter for other procedures for purposes other than remedying health state: Secondary | ICD-10-CM | POA: Diagnosis not present

## 2017-03-20 DIAGNOSIS — N2581 Secondary hyperparathyroidism of renal origin: Secondary | ICD-10-CM | POA: Diagnosis not present

## 2017-03-20 DIAGNOSIS — R7881 Bacteremia: Secondary | ICD-10-CM | POA: Diagnosis not present

## 2017-03-22 DIAGNOSIS — Z418 Encounter for other procedures for purposes other than remedying health state: Secondary | ICD-10-CM | POA: Diagnosis not present

## 2017-03-22 DIAGNOSIS — N2581 Secondary hyperparathyroidism of renal origin: Secondary | ICD-10-CM | POA: Diagnosis not present

## 2017-03-22 DIAGNOSIS — T80219A Unspecified infection due to central venous catheter, initial encounter: Secondary | ICD-10-CM | POA: Diagnosis not present

## 2017-03-22 DIAGNOSIS — D631 Anemia in chronic kidney disease: Secondary | ICD-10-CM | POA: Diagnosis not present

## 2017-03-22 DIAGNOSIS — R7881 Bacteremia: Secondary | ICD-10-CM | POA: Diagnosis not present

## 2017-03-22 DIAGNOSIS — N186 End stage renal disease: Secondary | ICD-10-CM | POA: Diagnosis not present

## 2017-03-25 DIAGNOSIS — Z418 Encounter for other procedures for purposes other than remedying health state: Secondary | ICD-10-CM | POA: Diagnosis not present

## 2017-03-25 DIAGNOSIS — N186 End stage renal disease: Secondary | ICD-10-CM | POA: Diagnosis not present

## 2017-03-25 DIAGNOSIS — D631 Anemia in chronic kidney disease: Secondary | ICD-10-CM | POA: Diagnosis not present

## 2017-03-25 DIAGNOSIS — T80219A Unspecified infection due to central venous catheter, initial encounter: Secondary | ICD-10-CM | POA: Diagnosis not present

## 2017-03-25 DIAGNOSIS — R7881 Bacteremia: Secondary | ICD-10-CM | POA: Diagnosis not present

## 2017-03-25 DIAGNOSIS — N2581 Secondary hyperparathyroidism of renal origin: Secondary | ICD-10-CM | POA: Diagnosis not present

## 2017-03-26 DIAGNOSIS — J339 Nasal polyp, unspecified: Secondary | ICD-10-CM | POA: Diagnosis not present

## 2017-03-27 DIAGNOSIS — N2581 Secondary hyperparathyroidism of renal origin: Secondary | ICD-10-CM | POA: Diagnosis not present

## 2017-03-27 DIAGNOSIS — D631 Anemia in chronic kidney disease: Secondary | ICD-10-CM | POA: Diagnosis not present

## 2017-03-27 DIAGNOSIS — R7881 Bacteremia: Secondary | ICD-10-CM | POA: Diagnosis not present

## 2017-03-27 DIAGNOSIS — Z418 Encounter for other procedures for purposes other than remedying health state: Secondary | ICD-10-CM | POA: Diagnosis not present

## 2017-03-27 DIAGNOSIS — T80219A Unspecified infection due to central venous catheter, initial encounter: Secondary | ICD-10-CM | POA: Diagnosis not present

## 2017-03-27 DIAGNOSIS — N186 End stage renal disease: Secondary | ICD-10-CM | POA: Diagnosis not present

## 2017-03-29 DIAGNOSIS — R7881 Bacteremia: Secondary | ICD-10-CM | POA: Diagnosis not present

## 2017-03-29 DIAGNOSIS — N2581 Secondary hyperparathyroidism of renal origin: Secondary | ICD-10-CM | POA: Diagnosis not present

## 2017-03-29 DIAGNOSIS — T80219A Unspecified infection due to central venous catheter, initial encounter: Secondary | ICD-10-CM | POA: Diagnosis not present

## 2017-03-29 DIAGNOSIS — N186 End stage renal disease: Secondary | ICD-10-CM | POA: Diagnosis not present

## 2017-03-29 DIAGNOSIS — D631 Anemia in chronic kidney disease: Secondary | ICD-10-CM | POA: Diagnosis not present

## 2017-03-29 DIAGNOSIS — Z418 Encounter for other procedures for purposes other than remedying health state: Secondary | ICD-10-CM | POA: Diagnosis not present

## 2017-04-01 DIAGNOSIS — N2581 Secondary hyperparathyroidism of renal origin: Secondary | ICD-10-CM | POA: Diagnosis not present

## 2017-04-01 DIAGNOSIS — T80219A Unspecified infection due to central venous catheter, initial encounter: Secondary | ICD-10-CM | POA: Diagnosis not present

## 2017-04-01 DIAGNOSIS — Z418 Encounter for other procedures for purposes other than remedying health state: Secondary | ICD-10-CM | POA: Diagnosis not present

## 2017-04-01 DIAGNOSIS — N186 End stage renal disease: Secondary | ICD-10-CM | POA: Diagnosis not present

## 2017-04-01 DIAGNOSIS — R7881 Bacteremia: Secondary | ICD-10-CM | POA: Diagnosis not present

## 2017-04-01 DIAGNOSIS — D631 Anemia in chronic kidney disease: Secondary | ICD-10-CM | POA: Diagnosis not present

## 2017-04-02 DIAGNOSIS — E1122 Type 2 diabetes mellitus with diabetic chronic kidney disease: Secondary | ICD-10-CM | POA: Diagnosis not present

## 2017-04-02 DIAGNOSIS — N186 End stage renal disease: Secondary | ICD-10-CM | POA: Diagnosis not present

## 2017-04-02 DIAGNOSIS — M150 Primary generalized (osteo)arthritis: Secondary | ICD-10-CM | POA: Diagnosis not present

## 2017-04-02 DIAGNOSIS — I5022 Chronic systolic (congestive) heart failure: Secondary | ICD-10-CM | POA: Diagnosis not present

## 2017-04-03 DIAGNOSIS — R7881 Bacteremia: Secondary | ICD-10-CM | POA: Diagnosis not present

## 2017-04-03 DIAGNOSIS — D631 Anemia in chronic kidney disease: Secondary | ICD-10-CM | POA: Diagnosis not present

## 2017-04-03 DIAGNOSIS — Z418 Encounter for other procedures for purposes other than remedying health state: Secondary | ICD-10-CM | POA: Diagnosis not present

## 2017-04-03 DIAGNOSIS — N186 End stage renal disease: Secondary | ICD-10-CM | POA: Diagnosis not present

## 2017-04-03 DIAGNOSIS — N2581 Secondary hyperparathyroidism of renal origin: Secondary | ICD-10-CM | POA: Diagnosis not present

## 2017-04-03 DIAGNOSIS — T80219A Unspecified infection due to central venous catheter, initial encounter: Secondary | ICD-10-CM | POA: Diagnosis not present

## 2017-04-05 DIAGNOSIS — N2581 Secondary hyperparathyroidism of renal origin: Secondary | ICD-10-CM | POA: Diagnosis not present

## 2017-04-05 DIAGNOSIS — T80219A Unspecified infection due to central venous catheter, initial encounter: Secondary | ICD-10-CM | POA: Diagnosis not present

## 2017-04-05 DIAGNOSIS — R7881 Bacteremia: Secondary | ICD-10-CM | POA: Diagnosis not present

## 2017-04-05 DIAGNOSIS — Z418 Encounter for other procedures for purposes other than remedying health state: Secondary | ICD-10-CM | POA: Diagnosis not present

## 2017-04-05 DIAGNOSIS — D631 Anemia in chronic kidney disease: Secondary | ICD-10-CM | POA: Diagnosis not present

## 2017-04-05 DIAGNOSIS — N186 End stage renal disease: Secondary | ICD-10-CM | POA: Diagnosis not present

## 2017-04-08 DIAGNOSIS — N2581 Secondary hyperparathyroidism of renal origin: Secondary | ICD-10-CM | POA: Diagnosis not present

## 2017-04-08 DIAGNOSIS — N186 End stage renal disease: Secondary | ICD-10-CM | POA: Diagnosis not present

## 2017-04-08 DIAGNOSIS — T80219A Unspecified infection due to central venous catheter, initial encounter: Secondary | ICD-10-CM | POA: Diagnosis not present

## 2017-04-08 DIAGNOSIS — D631 Anemia in chronic kidney disease: Secondary | ICD-10-CM | POA: Diagnosis not present

## 2017-04-08 DIAGNOSIS — R7881 Bacteremia: Secondary | ICD-10-CM | POA: Diagnosis not present

## 2017-04-08 DIAGNOSIS — Z418 Encounter for other procedures for purposes other than remedying health state: Secondary | ICD-10-CM | POA: Diagnosis not present

## 2017-04-10 DIAGNOSIS — D631 Anemia in chronic kidney disease: Secondary | ICD-10-CM | POA: Diagnosis not present

## 2017-04-10 DIAGNOSIS — N2581 Secondary hyperparathyroidism of renal origin: Secondary | ICD-10-CM | POA: Diagnosis not present

## 2017-04-10 DIAGNOSIS — N186 End stage renal disease: Secondary | ICD-10-CM | POA: Diagnosis not present

## 2017-04-10 DIAGNOSIS — R7881 Bacteremia: Secondary | ICD-10-CM | POA: Diagnosis not present

## 2017-04-10 DIAGNOSIS — Z418 Encounter for other procedures for purposes other than remedying health state: Secondary | ICD-10-CM | POA: Diagnosis not present

## 2017-04-10 DIAGNOSIS — T80219A Unspecified infection due to central venous catheter, initial encounter: Secondary | ICD-10-CM | POA: Diagnosis not present

## 2017-04-11 DIAGNOSIS — D631 Anemia in chronic kidney disease: Secondary | ICD-10-CM | POA: Diagnosis not present

## 2017-04-11 DIAGNOSIS — I10 Essential (primary) hypertension: Secondary | ICD-10-CM | POA: Diagnosis not present

## 2017-04-11 DIAGNOSIS — N186 End stage renal disease: Secondary | ICD-10-CM | POA: Diagnosis not present

## 2017-04-12 DIAGNOSIS — N186 End stage renal disease: Secondary | ICD-10-CM | POA: Diagnosis not present

## 2017-04-12 DIAGNOSIS — N2581 Secondary hyperparathyroidism of renal origin: Secondary | ICD-10-CM | POA: Diagnosis not present

## 2017-04-12 DIAGNOSIS — T8249XA Other complication of vascular dialysis catheter, initial encounter: Secondary | ICD-10-CM | POA: Diagnosis not present

## 2017-04-12 DIAGNOSIS — D631 Anemia in chronic kidney disease: Secondary | ICD-10-CM | POA: Diagnosis not present

## 2017-04-12 DIAGNOSIS — Z418 Encounter for other procedures for purposes other than remedying health state: Secondary | ICD-10-CM | POA: Diagnosis not present

## 2017-04-15 DIAGNOSIS — T8249XA Other complication of vascular dialysis catheter, initial encounter: Secondary | ICD-10-CM | POA: Diagnosis not present

## 2017-04-15 DIAGNOSIS — N186 End stage renal disease: Secondary | ICD-10-CM | POA: Diagnosis not present

## 2017-04-15 DIAGNOSIS — D631 Anemia in chronic kidney disease: Secondary | ICD-10-CM | POA: Diagnosis not present

## 2017-04-15 DIAGNOSIS — Z418 Encounter for other procedures for purposes other than remedying health state: Secondary | ICD-10-CM | POA: Diagnosis not present

## 2017-04-15 DIAGNOSIS — N2581 Secondary hyperparathyroidism of renal origin: Secondary | ICD-10-CM | POA: Diagnosis not present

## 2017-04-17 DIAGNOSIS — N186 End stage renal disease: Secondary | ICD-10-CM | POA: Diagnosis not present

## 2017-04-17 DIAGNOSIS — N2581 Secondary hyperparathyroidism of renal origin: Secondary | ICD-10-CM | POA: Diagnosis not present

## 2017-04-17 DIAGNOSIS — D631 Anemia in chronic kidney disease: Secondary | ICD-10-CM | POA: Diagnosis not present

## 2017-04-17 DIAGNOSIS — Z418 Encounter for other procedures for purposes other than remedying health state: Secondary | ICD-10-CM | POA: Diagnosis not present

## 2017-04-17 DIAGNOSIS — T8249XA Other complication of vascular dialysis catheter, initial encounter: Secondary | ICD-10-CM | POA: Diagnosis not present

## 2017-04-19 DIAGNOSIS — T8249XA Other complication of vascular dialysis catheter, initial encounter: Secondary | ICD-10-CM | POA: Diagnosis not present

## 2017-04-19 DIAGNOSIS — Z418 Encounter for other procedures for purposes other than remedying health state: Secondary | ICD-10-CM | POA: Diagnosis not present

## 2017-04-19 DIAGNOSIS — N186 End stage renal disease: Secondary | ICD-10-CM | POA: Diagnosis not present

## 2017-04-19 DIAGNOSIS — N2581 Secondary hyperparathyroidism of renal origin: Secondary | ICD-10-CM | POA: Diagnosis not present

## 2017-04-19 DIAGNOSIS — D631 Anemia in chronic kidney disease: Secondary | ICD-10-CM | POA: Diagnosis not present

## 2017-04-22 DIAGNOSIS — N186 End stage renal disease: Secondary | ICD-10-CM | POA: Diagnosis not present

## 2017-04-22 DIAGNOSIS — Z418 Encounter for other procedures for purposes other than remedying health state: Secondary | ICD-10-CM | POA: Diagnosis not present

## 2017-04-22 DIAGNOSIS — T8249XA Other complication of vascular dialysis catheter, initial encounter: Secondary | ICD-10-CM | POA: Diagnosis not present

## 2017-04-22 DIAGNOSIS — N2581 Secondary hyperparathyroidism of renal origin: Secondary | ICD-10-CM | POA: Diagnosis not present

## 2017-04-22 DIAGNOSIS — D631 Anemia in chronic kidney disease: Secondary | ICD-10-CM | POA: Diagnosis not present

## 2017-04-24 DIAGNOSIS — N186 End stage renal disease: Secondary | ICD-10-CM | POA: Diagnosis not present

## 2017-04-24 DIAGNOSIS — T8249XA Other complication of vascular dialysis catheter, initial encounter: Secondary | ICD-10-CM | POA: Diagnosis not present

## 2017-04-24 DIAGNOSIS — N2581 Secondary hyperparathyroidism of renal origin: Secondary | ICD-10-CM | POA: Diagnosis not present

## 2017-04-24 DIAGNOSIS — D631 Anemia in chronic kidney disease: Secondary | ICD-10-CM | POA: Diagnosis not present

## 2017-04-24 DIAGNOSIS — Z418 Encounter for other procedures for purposes other than remedying health state: Secondary | ICD-10-CM | POA: Diagnosis not present

## 2017-04-26 DIAGNOSIS — Z418 Encounter for other procedures for purposes other than remedying health state: Secondary | ICD-10-CM | POA: Diagnosis not present

## 2017-04-26 DIAGNOSIS — N2581 Secondary hyperparathyroidism of renal origin: Secondary | ICD-10-CM | POA: Diagnosis not present

## 2017-04-26 DIAGNOSIS — N186 End stage renal disease: Secondary | ICD-10-CM | POA: Diagnosis not present

## 2017-04-26 DIAGNOSIS — D631 Anemia in chronic kidney disease: Secondary | ICD-10-CM | POA: Diagnosis not present

## 2017-04-26 DIAGNOSIS — T8249XA Other complication of vascular dialysis catheter, initial encounter: Secondary | ICD-10-CM | POA: Diagnosis not present

## 2017-04-28 DIAGNOSIS — N2581 Secondary hyperparathyroidism of renal origin: Secondary | ICD-10-CM | POA: Diagnosis not present

## 2017-04-28 DIAGNOSIS — D631 Anemia in chronic kidney disease: Secondary | ICD-10-CM | POA: Diagnosis not present

## 2017-04-28 DIAGNOSIS — I10 Essential (primary) hypertension: Secondary | ICD-10-CM | POA: Diagnosis not present

## 2017-04-28 DIAGNOSIS — T8249XA Other complication of vascular dialysis catheter, initial encounter: Secondary | ICD-10-CM | POA: Diagnosis not present

## 2017-04-28 DIAGNOSIS — I5022 Chronic systolic (congestive) heart failure: Secondary | ICD-10-CM | POA: Diagnosis not present

## 2017-04-28 DIAGNOSIS — N186 End stage renal disease: Secondary | ICD-10-CM | POA: Diagnosis not present

## 2017-04-28 DIAGNOSIS — M6281 Muscle weakness (generalized): Secondary | ICD-10-CM | POA: Diagnosis not present

## 2017-04-28 DIAGNOSIS — M150 Primary generalized (osteo)arthritis: Secondary | ICD-10-CM | POA: Diagnosis not present

## 2017-04-28 DIAGNOSIS — Z418 Encounter for other procedures for purposes other than remedying health state: Secondary | ICD-10-CM | POA: Diagnosis not present

## 2017-04-30 DIAGNOSIS — N186 End stage renal disease: Secondary | ICD-10-CM | POA: Diagnosis not present

## 2017-04-30 DIAGNOSIS — N2581 Secondary hyperparathyroidism of renal origin: Secondary | ICD-10-CM | POA: Diagnosis not present

## 2017-04-30 DIAGNOSIS — D631 Anemia in chronic kidney disease: Secondary | ICD-10-CM | POA: Diagnosis not present

## 2017-04-30 DIAGNOSIS — T8249XA Other complication of vascular dialysis catheter, initial encounter: Secondary | ICD-10-CM | POA: Diagnosis not present

## 2017-04-30 DIAGNOSIS — Z418 Encounter for other procedures for purposes other than remedying health state: Secondary | ICD-10-CM | POA: Diagnosis not present

## 2017-05-03 DIAGNOSIS — Z9981 Dependence on supplemental oxygen: Secondary | ICD-10-CM | POA: Diagnosis not present

## 2017-05-03 DIAGNOSIS — Z418 Encounter for other procedures for purposes other than remedying health state: Secondary | ICD-10-CM | POA: Diagnosis not present

## 2017-05-03 DIAGNOSIS — N2581 Secondary hyperparathyroidism of renal origin: Secondary | ICD-10-CM | POA: Diagnosis not present

## 2017-05-03 DIAGNOSIS — T8249XA Other complication of vascular dialysis catheter, initial encounter: Secondary | ICD-10-CM | POA: Diagnosis not present

## 2017-05-03 DIAGNOSIS — Z7401 Bed confinement status: Secondary | ICD-10-CM | POA: Diagnosis not present

## 2017-05-03 DIAGNOSIS — D631 Anemia in chronic kidney disease: Secondary | ICD-10-CM | POA: Diagnosis not present

## 2017-05-03 DIAGNOSIS — N186 End stage renal disease: Secondary | ICD-10-CM | POA: Diagnosis not present

## 2017-05-06 DIAGNOSIS — N2581 Secondary hyperparathyroidism of renal origin: Secondary | ICD-10-CM | POA: Diagnosis not present

## 2017-05-06 DIAGNOSIS — N186 End stage renal disease: Secondary | ICD-10-CM | POA: Diagnosis not present

## 2017-05-06 DIAGNOSIS — Z418 Encounter for other procedures for purposes other than remedying health state: Secondary | ICD-10-CM | POA: Diagnosis not present

## 2017-05-06 DIAGNOSIS — D631 Anemia in chronic kidney disease: Secondary | ICD-10-CM | POA: Diagnosis not present

## 2017-05-06 DIAGNOSIS — Z7401 Bed confinement status: Secondary | ICD-10-CM | POA: Diagnosis not present

## 2017-05-06 DIAGNOSIS — T8249XA Other complication of vascular dialysis catheter, initial encounter: Secondary | ICD-10-CM | POA: Diagnosis not present

## 2017-05-06 DIAGNOSIS — Z9981 Dependence on supplemental oxygen: Secondary | ICD-10-CM | POA: Diagnosis not present

## 2017-05-08 DIAGNOSIS — T8249XA Other complication of vascular dialysis catheter, initial encounter: Secondary | ICD-10-CM | POA: Diagnosis not present

## 2017-05-08 DIAGNOSIS — Z9981 Dependence on supplemental oxygen: Secondary | ICD-10-CM | POA: Diagnosis not present

## 2017-05-08 DIAGNOSIS — Z7401 Bed confinement status: Secondary | ICD-10-CM | POA: Diagnosis not present

## 2017-05-08 DIAGNOSIS — N2581 Secondary hyperparathyroidism of renal origin: Secondary | ICD-10-CM | POA: Diagnosis not present

## 2017-05-08 DIAGNOSIS — N186 End stage renal disease: Secondary | ICD-10-CM | POA: Diagnosis not present

## 2017-05-08 DIAGNOSIS — D631 Anemia in chronic kidney disease: Secondary | ICD-10-CM | POA: Diagnosis not present

## 2017-05-08 DIAGNOSIS — Z418 Encounter for other procedures for purposes other than remedying health state: Secondary | ICD-10-CM | POA: Diagnosis not present

## 2017-05-10 DIAGNOSIS — Z418 Encounter for other procedures for purposes other than remedying health state: Secondary | ICD-10-CM | POA: Diagnosis not present

## 2017-05-10 DIAGNOSIS — Z7401 Bed confinement status: Secondary | ICD-10-CM | POA: Diagnosis not present

## 2017-05-10 DIAGNOSIS — D631 Anemia in chronic kidney disease: Secondary | ICD-10-CM | POA: Diagnosis not present

## 2017-05-10 DIAGNOSIS — N2581 Secondary hyperparathyroidism of renal origin: Secondary | ICD-10-CM | POA: Diagnosis not present

## 2017-05-10 DIAGNOSIS — Z9981 Dependence on supplemental oxygen: Secondary | ICD-10-CM | POA: Diagnosis not present

## 2017-05-10 DIAGNOSIS — T8249XA Other complication of vascular dialysis catheter, initial encounter: Secondary | ICD-10-CM | POA: Diagnosis not present

## 2017-05-10 DIAGNOSIS — N186 End stage renal disease: Secondary | ICD-10-CM | POA: Diagnosis not present

## 2017-05-11 DIAGNOSIS — I10 Essential (primary) hypertension: Secondary | ICD-10-CM | POA: Diagnosis not present

## 2017-05-11 DIAGNOSIS — N186 End stage renal disease: Secondary | ICD-10-CM | POA: Diagnosis not present

## 2017-05-11 DIAGNOSIS — D631 Anemia in chronic kidney disease: Secondary | ICD-10-CM | POA: Diagnosis not present

## 2017-05-13 DIAGNOSIS — N186 End stage renal disease: Secondary | ICD-10-CM | POA: Diagnosis not present

## 2017-05-13 DIAGNOSIS — N2581 Secondary hyperparathyroidism of renal origin: Secondary | ICD-10-CM | POA: Diagnosis not present

## 2017-05-13 DIAGNOSIS — Z9981 Dependence on supplemental oxygen: Secondary | ICD-10-CM | POA: Diagnosis not present

## 2017-05-13 DIAGNOSIS — Z418 Encounter for other procedures for purposes other than remedying health state: Secondary | ICD-10-CM | POA: Diagnosis not present

## 2017-05-13 DIAGNOSIS — Z7401 Bed confinement status: Secondary | ICD-10-CM | POA: Diagnosis not present

## 2017-05-13 DIAGNOSIS — D631 Anemia in chronic kidney disease: Secondary | ICD-10-CM | POA: Diagnosis not present

## 2017-05-15 DIAGNOSIS — N2581 Secondary hyperparathyroidism of renal origin: Secondary | ICD-10-CM | POA: Diagnosis not present

## 2017-05-15 DIAGNOSIS — Z7401 Bed confinement status: Secondary | ICD-10-CM | POA: Diagnosis not present

## 2017-05-15 DIAGNOSIS — I639 Cerebral infarction, unspecified: Secondary | ICD-10-CM | POA: Diagnosis not present

## 2017-05-15 DIAGNOSIS — D631 Anemia in chronic kidney disease: Secondary | ICD-10-CM | POA: Diagnosis not present

## 2017-05-15 DIAGNOSIS — N186 End stage renal disease: Secondary | ICD-10-CM | POA: Diagnosis not present

## 2017-05-15 DIAGNOSIS — Z9981 Dependence on supplemental oxygen: Secondary | ICD-10-CM | POA: Diagnosis not present

## 2017-05-15 DIAGNOSIS — Z418 Encounter for other procedures for purposes other than remedying health state: Secondary | ICD-10-CM | POA: Diagnosis not present

## 2017-05-17 DIAGNOSIS — D631 Anemia in chronic kidney disease: Secondary | ICD-10-CM | POA: Diagnosis not present

## 2017-05-17 DIAGNOSIS — Z9981 Dependence on supplemental oxygen: Secondary | ICD-10-CM | POA: Diagnosis not present

## 2017-05-17 DIAGNOSIS — Z7401 Bed confinement status: Secondary | ICD-10-CM | POA: Diagnosis not present

## 2017-05-17 DIAGNOSIS — N2581 Secondary hyperparathyroidism of renal origin: Secondary | ICD-10-CM | POA: Diagnosis not present

## 2017-05-17 DIAGNOSIS — N186 End stage renal disease: Secondary | ICD-10-CM | POA: Diagnosis not present

## 2017-05-17 DIAGNOSIS — Z418 Encounter for other procedures for purposes other than remedying health state: Secondary | ICD-10-CM | POA: Diagnosis not present

## 2017-05-18 DIAGNOSIS — M109 Gout, unspecified: Secondary | ICD-10-CM | POA: Diagnosis not present

## 2017-05-18 DIAGNOSIS — I5022 Chronic systolic (congestive) heart failure: Secondary | ICD-10-CM | POA: Diagnosis not present

## 2017-05-18 DIAGNOSIS — J069 Acute upper respiratory infection, unspecified: Secondary | ICD-10-CM | POA: Diagnosis not present

## 2017-05-18 DIAGNOSIS — I10 Essential (primary) hypertension: Secondary | ICD-10-CM | POA: Diagnosis not present

## 2017-05-20 DIAGNOSIS — D631 Anemia in chronic kidney disease: Secondary | ICD-10-CM | POA: Diagnosis not present

## 2017-05-20 DIAGNOSIS — Z418 Encounter for other procedures for purposes other than remedying health state: Secondary | ICD-10-CM | POA: Diagnosis not present

## 2017-05-20 DIAGNOSIS — Z7401 Bed confinement status: Secondary | ICD-10-CM | POA: Diagnosis not present

## 2017-05-20 DIAGNOSIS — Z9981 Dependence on supplemental oxygen: Secondary | ICD-10-CM | POA: Diagnosis not present

## 2017-05-20 DIAGNOSIS — N2581 Secondary hyperparathyroidism of renal origin: Secondary | ICD-10-CM | POA: Diagnosis not present

## 2017-05-20 DIAGNOSIS — N186 End stage renal disease: Secondary | ICD-10-CM | POA: Diagnosis not present

## 2017-05-22 DIAGNOSIS — D631 Anemia in chronic kidney disease: Secondary | ICD-10-CM | POA: Diagnosis not present

## 2017-05-22 DIAGNOSIS — Z7401 Bed confinement status: Secondary | ICD-10-CM | POA: Diagnosis not present

## 2017-05-22 DIAGNOSIS — Z418 Encounter for other procedures for purposes other than remedying health state: Secondary | ICD-10-CM | POA: Diagnosis not present

## 2017-05-22 DIAGNOSIS — Z9981 Dependence on supplemental oxygen: Secondary | ICD-10-CM | POA: Diagnosis not present

## 2017-05-22 DIAGNOSIS — N186 End stage renal disease: Secondary | ICD-10-CM | POA: Diagnosis not present

## 2017-05-22 DIAGNOSIS — N2581 Secondary hyperparathyroidism of renal origin: Secondary | ICD-10-CM | POA: Diagnosis not present

## 2017-05-24 DIAGNOSIS — R531 Weakness: Secondary | ICD-10-CM | POA: Diagnosis not present

## 2017-05-24 DIAGNOSIS — Z7401 Bed confinement status: Secondary | ICD-10-CM | POA: Diagnosis not present

## 2017-05-24 DIAGNOSIS — Z9981 Dependence on supplemental oxygen: Secondary | ICD-10-CM | POA: Diagnosis not present

## 2017-05-24 DIAGNOSIS — D631 Anemia in chronic kidney disease: Secondary | ICD-10-CM | POA: Diagnosis not present

## 2017-05-24 DIAGNOSIS — N186 End stage renal disease: Secondary | ICD-10-CM | POA: Diagnosis not present

## 2017-05-24 DIAGNOSIS — Z418 Encounter for other procedures for purposes other than remedying health state: Secondary | ICD-10-CM | POA: Diagnosis not present

## 2017-05-24 DIAGNOSIS — N2581 Secondary hyperparathyroidism of renal origin: Secondary | ICD-10-CM | POA: Diagnosis not present

## 2017-05-26 DIAGNOSIS — I5022 Chronic systolic (congestive) heart failure: Secondary | ICD-10-CM | POA: Diagnosis not present

## 2017-05-26 DIAGNOSIS — D649 Anemia, unspecified: Secondary | ICD-10-CM | POA: Diagnosis not present

## 2017-05-26 DIAGNOSIS — E119 Type 2 diabetes mellitus without complications: Secondary | ICD-10-CM | POA: Diagnosis not present

## 2017-05-26 DIAGNOSIS — I10 Essential (primary) hypertension: Secondary | ICD-10-CM | POA: Diagnosis not present

## 2017-05-27 DIAGNOSIS — Z9981 Dependence on supplemental oxygen: Secondary | ICD-10-CM | POA: Diagnosis not present

## 2017-05-27 DIAGNOSIS — D631 Anemia in chronic kidney disease: Secondary | ICD-10-CM | POA: Diagnosis not present

## 2017-05-27 DIAGNOSIS — Z7401 Bed confinement status: Secondary | ICD-10-CM | POA: Diagnosis not present

## 2017-05-27 DIAGNOSIS — Z418 Encounter for other procedures for purposes other than remedying health state: Secondary | ICD-10-CM | POA: Diagnosis not present

## 2017-05-27 DIAGNOSIS — N186 End stage renal disease: Secondary | ICD-10-CM | POA: Diagnosis not present

## 2017-05-27 DIAGNOSIS — N2581 Secondary hyperparathyroidism of renal origin: Secondary | ICD-10-CM | POA: Diagnosis not present

## 2017-05-29 DIAGNOSIS — D631 Anemia in chronic kidney disease: Secondary | ICD-10-CM | POA: Diagnosis not present

## 2017-05-29 DIAGNOSIS — Z9981 Dependence on supplemental oxygen: Secondary | ICD-10-CM | POA: Diagnosis not present

## 2017-05-29 DIAGNOSIS — N2581 Secondary hyperparathyroidism of renal origin: Secondary | ICD-10-CM | POA: Diagnosis not present

## 2017-05-29 DIAGNOSIS — N186 End stage renal disease: Secondary | ICD-10-CM | POA: Diagnosis not present

## 2017-05-29 DIAGNOSIS — Z7401 Bed confinement status: Secondary | ICD-10-CM | POA: Diagnosis not present

## 2017-05-29 DIAGNOSIS — Z418 Encounter for other procedures for purposes other than remedying health state: Secondary | ICD-10-CM | POA: Diagnosis not present

## 2017-05-31 DIAGNOSIS — Z418 Encounter for other procedures for purposes other than remedying health state: Secondary | ICD-10-CM | POA: Diagnosis not present

## 2017-05-31 DIAGNOSIS — Z7401 Bed confinement status: Secondary | ICD-10-CM | POA: Diagnosis not present

## 2017-05-31 DIAGNOSIS — Z9981 Dependence on supplemental oxygen: Secondary | ICD-10-CM | POA: Diagnosis not present

## 2017-05-31 DIAGNOSIS — N186 End stage renal disease: Secondary | ICD-10-CM | POA: Diagnosis not present

## 2017-05-31 DIAGNOSIS — D631 Anemia in chronic kidney disease: Secondary | ICD-10-CM | POA: Diagnosis not present

## 2017-05-31 DIAGNOSIS — N2581 Secondary hyperparathyroidism of renal origin: Secondary | ICD-10-CM | POA: Diagnosis not present

## 2017-06-02 DIAGNOSIS — Z7401 Bed confinement status: Secondary | ICD-10-CM | POA: Diagnosis not present

## 2017-06-02 DIAGNOSIS — D631 Anemia in chronic kidney disease: Secondary | ICD-10-CM | POA: Diagnosis not present

## 2017-06-02 DIAGNOSIS — N2581 Secondary hyperparathyroidism of renal origin: Secondary | ICD-10-CM | POA: Diagnosis not present

## 2017-06-02 DIAGNOSIS — Z9981 Dependence on supplemental oxygen: Secondary | ICD-10-CM | POA: Diagnosis not present

## 2017-06-02 DIAGNOSIS — Z418 Encounter for other procedures for purposes other than remedying health state: Secondary | ICD-10-CM | POA: Diagnosis not present

## 2017-06-02 DIAGNOSIS — N186 End stage renal disease: Secondary | ICD-10-CM | POA: Diagnosis not present

## 2017-06-05 DIAGNOSIS — N2581 Secondary hyperparathyroidism of renal origin: Secondary | ICD-10-CM | POA: Diagnosis not present

## 2017-06-05 DIAGNOSIS — Z7401 Bed confinement status: Secondary | ICD-10-CM | POA: Diagnosis not present

## 2017-06-05 DIAGNOSIS — D631 Anemia in chronic kidney disease: Secondary | ICD-10-CM | POA: Diagnosis not present

## 2017-06-05 DIAGNOSIS — Z9981 Dependence on supplemental oxygen: Secondary | ICD-10-CM | POA: Diagnosis not present

## 2017-06-05 DIAGNOSIS — N186 End stage renal disease: Secondary | ICD-10-CM | POA: Diagnosis not present

## 2017-06-05 DIAGNOSIS — Z418 Encounter for other procedures for purposes other than remedying health state: Secondary | ICD-10-CM | POA: Diagnosis not present

## 2017-06-06 DIAGNOSIS — E1142 Type 2 diabetes mellitus with diabetic polyneuropathy: Secondary | ICD-10-CM | POA: Diagnosis not present

## 2017-06-06 DIAGNOSIS — N186 End stage renal disease: Secondary | ICD-10-CM | POA: Diagnosis not present

## 2017-06-07 DIAGNOSIS — N186 End stage renal disease: Secondary | ICD-10-CM | POA: Diagnosis not present

## 2017-06-07 DIAGNOSIS — D631 Anemia in chronic kidney disease: Secondary | ICD-10-CM | POA: Diagnosis not present

## 2017-06-07 DIAGNOSIS — N2581 Secondary hyperparathyroidism of renal origin: Secondary | ICD-10-CM | POA: Diagnosis not present

## 2017-06-07 DIAGNOSIS — Z9981 Dependence on supplemental oxygen: Secondary | ICD-10-CM | POA: Diagnosis not present

## 2017-06-07 DIAGNOSIS — Z7401 Bed confinement status: Secondary | ICD-10-CM | POA: Diagnosis not present

## 2017-06-07 DIAGNOSIS — Z418 Encounter for other procedures for purposes other than remedying health state: Secondary | ICD-10-CM | POA: Diagnosis not present

## 2017-06-09 DIAGNOSIS — D631 Anemia in chronic kidney disease: Secondary | ICD-10-CM | POA: Diagnosis not present

## 2017-06-09 DIAGNOSIS — N186 End stage renal disease: Secondary | ICD-10-CM | POA: Diagnosis not present

## 2017-06-09 DIAGNOSIS — N2581 Secondary hyperparathyroidism of renal origin: Secondary | ICD-10-CM | POA: Diagnosis not present

## 2017-06-09 DIAGNOSIS — Z418 Encounter for other procedures for purposes other than remedying health state: Secondary | ICD-10-CM | POA: Diagnosis not present

## 2017-06-09 DIAGNOSIS — Z9981 Dependence on supplemental oxygen: Secondary | ICD-10-CM | POA: Diagnosis not present

## 2017-06-09 DIAGNOSIS — Z7401 Bed confinement status: Secondary | ICD-10-CM | POA: Diagnosis not present

## 2017-06-11 DIAGNOSIS — N186 End stage renal disease: Secondary | ICD-10-CM | POA: Diagnosis not present

## 2017-06-11 DIAGNOSIS — I10 Essential (primary) hypertension: Secondary | ICD-10-CM | POA: Diagnosis not present

## 2017-06-11 DIAGNOSIS — D631 Anemia in chronic kidney disease: Secondary | ICD-10-CM | POA: Diagnosis not present

## 2017-06-12 DIAGNOSIS — Z7401 Bed confinement status: Secondary | ICD-10-CM | POA: Diagnosis not present

## 2017-06-12 DIAGNOSIS — Z418 Encounter for other procedures for purposes other than remedying health state: Secondary | ICD-10-CM | POA: Diagnosis not present

## 2017-06-12 DIAGNOSIS — Z9981 Dependence on supplemental oxygen: Secondary | ICD-10-CM | POA: Diagnosis not present

## 2017-06-12 DIAGNOSIS — N186 End stage renal disease: Secondary | ICD-10-CM | POA: Diagnosis not present

## 2017-06-12 DIAGNOSIS — M109 Gout, unspecified: Secondary | ICD-10-CM | POA: Diagnosis not present

## 2017-06-12 DIAGNOSIS — D631 Anemia in chronic kidney disease: Secondary | ICD-10-CM | POA: Diagnosis not present

## 2017-06-12 DIAGNOSIS — I10 Essential (primary) hypertension: Secondary | ICD-10-CM | POA: Diagnosis not present

## 2017-06-12 DIAGNOSIS — N2581 Secondary hyperparathyroidism of renal origin: Secondary | ICD-10-CM | POA: Diagnosis not present

## 2017-06-12 DIAGNOSIS — E119 Type 2 diabetes mellitus without complications: Secondary | ICD-10-CM | POA: Diagnosis not present

## 2017-06-14 DIAGNOSIS — N186 End stage renal disease: Secondary | ICD-10-CM | POA: Diagnosis not present

## 2017-06-14 DIAGNOSIS — N2581 Secondary hyperparathyroidism of renal origin: Secondary | ICD-10-CM | POA: Diagnosis not present

## 2017-06-14 DIAGNOSIS — Z9981 Dependence on supplemental oxygen: Secondary | ICD-10-CM | POA: Diagnosis not present

## 2017-06-14 DIAGNOSIS — Z418 Encounter for other procedures for purposes other than remedying health state: Secondary | ICD-10-CM | POA: Diagnosis not present

## 2017-06-14 DIAGNOSIS — Z7401 Bed confinement status: Secondary | ICD-10-CM | POA: Diagnosis not present

## 2017-06-14 DIAGNOSIS — D631 Anemia in chronic kidney disease: Secondary | ICD-10-CM | POA: Diagnosis not present

## 2017-06-17 DIAGNOSIS — N186 End stage renal disease: Secondary | ICD-10-CM | POA: Diagnosis not present

## 2017-06-17 DIAGNOSIS — Z9981 Dependence on supplemental oxygen: Secondary | ICD-10-CM | POA: Diagnosis not present

## 2017-06-17 DIAGNOSIS — Z418 Encounter for other procedures for purposes other than remedying health state: Secondary | ICD-10-CM | POA: Diagnosis not present

## 2017-06-17 DIAGNOSIS — N2581 Secondary hyperparathyroidism of renal origin: Secondary | ICD-10-CM | POA: Diagnosis not present

## 2017-06-17 DIAGNOSIS — D631 Anemia in chronic kidney disease: Secondary | ICD-10-CM | POA: Diagnosis not present

## 2017-06-17 DIAGNOSIS — Z7401 Bed confinement status: Secondary | ICD-10-CM | POA: Diagnosis not present

## 2017-06-19 DIAGNOSIS — Z7401 Bed confinement status: Secondary | ICD-10-CM | POA: Diagnosis not present

## 2017-06-19 DIAGNOSIS — N2581 Secondary hyperparathyroidism of renal origin: Secondary | ICD-10-CM | POA: Diagnosis not present

## 2017-06-19 DIAGNOSIS — N186 End stage renal disease: Secondary | ICD-10-CM | POA: Diagnosis not present

## 2017-06-19 DIAGNOSIS — Z9981 Dependence on supplemental oxygen: Secondary | ICD-10-CM | POA: Diagnosis not present

## 2017-06-19 DIAGNOSIS — D631 Anemia in chronic kidney disease: Secondary | ICD-10-CM | POA: Diagnosis not present

## 2017-06-19 DIAGNOSIS — Z418 Encounter for other procedures for purposes other than remedying health state: Secondary | ICD-10-CM | POA: Diagnosis not present

## 2017-06-21 DIAGNOSIS — Z7401 Bed confinement status: Secondary | ICD-10-CM | POA: Diagnosis not present

## 2017-06-21 DIAGNOSIS — N186 End stage renal disease: Secondary | ICD-10-CM | POA: Diagnosis not present

## 2017-06-21 DIAGNOSIS — Z9981 Dependence on supplemental oxygen: Secondary | ICD-10-CM | POA: Diagnosis not present

## 2017-06-21 DIAGNOSIS — D631 Anemia in chronic kidney disease: Secondary | ICD-10-CM | POA: Diagnosis not present

## 2017-06-21 DIAGNOSIS — N2581 Secondary hyperparathyroidism of renal origin: Secondary | ICD-10-CM | POA: Diagnosis not present

## 2017-06-21 DIAGNOSIS — Z418 Encounter for other procedures for purposes other than remedying health state: Secondary | ICD-10-CM | POA: Diagnosis not present

## 2017-06-24 DIAGNOSIS — I5022 Chronic systolic (congestive) heart failure: Secondary | ICD-10-CM | POA: Diagnosis not present

## 2017-06-24 DIAGNOSIS — M2550 Pain in unspecified joint: Secondary | ICD-10-CM | POA: Diagnosis not present

## 2017-06-24 DIAGNOSIS — D631 Anemia in chronic kidney disease: Secondary | ICD-10-CM | POA: Diagnosis not present

## 2017-06-24 DIAGNOSIS — Z418 Encounter for other procedures for purposes other than remedying health state: Secondary | ICD-10-CM | POA: Diagnosis not present

## 2017-06-24 DIAGNOSIS — M6281 Muscle weakness (generalized): Secondary | ICD-10-CM | POA: Diagnosis not present

## 2017-06-24 DIAGNOSIS — M109 Gout, unspecified: Secondary | ICD-10-CM | POA: Diagnosis not present

## 2017-06-24 DIAGNOSIS — N186 End stage renal disease: Secondary | ICD-10-CM | POA: Diagnosis not present

## 2017-06-24 DIAGNOSIS — Z7401 Bed confinement status: Secondary | ICD-10-CM | POA: Diagnosis not present

## 2017-06-24 DIAGNOSIS — I10 Essential (primary) hypertension: Secondary | ICD-10-CM | POA: Diagnosis not present

## 2017-06-24 DIAGNOSIS — N2581 Secondary hyperparathyroidism of renal origin: Secondary | ICD-10-CM | POA: Diagnosis not present

## 2017-06-26 DIAGNOSIS — Z1159 Encounter for screening for other viral diseases: Secondary | ICD-10-CM | POA: Diagnosis not present

## 2017-06-26 DIAGNOSIS — D631 Anemia in chronic kidney disease: Secondary | ICD-10-CM | POA: Diagnosis not present

## 2017-06-26 DIAGNOSIS — Z7401 Bed confinement status: Secondary | ICD-10-CM | POA: Diagnosis not present

## 2017-06-26 DIAGNOSIS — E1121 Type 2 diabetes mellitus with diabetic nephropathy: Secondary | ICD-10-CM | POA: Diagnosis not present

## 2017-06-26 DIAGNOSIS — Z114 Encounter for screening for human immunodeficiency virus [HIV]: Secondary | ICD-10-CM | POA: Diagnosis not present

## 2017-06-26 DIAGNOSIS — N186 End stage renal disease: Secondary | ICD-10-CM | POA: Diagnosis not present

## 2017-06-26 DIAGNOSIS — Z418 Encounter for other procedures for purposes other than remedying health state: Secondary | ICD-10-CM | POA: Diagnosis not present

## 2017-06-26 DIAGNOSIS — M2550 Pain in unspecified joint: Secondary | ICD-10-CM | POA: Diagnosis not present

## 2017-06-26 DIAGNOSIS — N2581 Secondary hyperparathyroidism of renal origin: Secondary | ICD-10-CM | POA: Diagnosis not present

## 2017-06-28 DIAGNOSIS — Z7401 Bed confinement status: Secondary | ICD-10-CM | POA: Diagnosis not present

## 2017-06-28 DIAGNOSIS — N186 End stage renal disease: Secondary | ICD-10-CM | POA: Diagnosis not present

## 2017-06-28 DIAGNOSIS — N2581 Secondary hyperparathyroidism of renal origin: Secondary | ICD-10-CM | POA: Diagnosis not present

## 2017-06-28 DIAGNOSIS — D631 Anemia in chronic kidney disease: Secondary | ICD-10-CM | POA: Diagnosis not present

## 2017-06-28 DIAGNOSIS — Z418 Encounter for other procedures for purposes other than remedying health state: Secondary | ICD-10-CM | POA: Diagnosis not present

## 2017-06-28 DIAGNOSIS — M2550 Pain in unspecified joint: Secondary | ICD-10-CM | POA: Diagnosis not present

## 2017-07-01 DIAGNOSIS — N2581 Secondary hyperparathyroidism of renal origin: Secondary | ICD-10-CM | POA: Diagnosis not present

## 2017-07-01 DIAGNOSIS — M2550 Pain in unspecified joint: Secondary | ICD-10-CM | POA: Diagnosis not present

## 2017-07-01 DIAGNOSIS — Z418 Encounter for other procedures for purposes other than remedying health state: Secondary | ICD-10-CM | POA: Diagnosis not present

## 2017-07-01 DIAGNOSIS — D631 Anemia in chronic kidney disease: Secondary | ICD-10-CM | POA: Diagnosis not present

## 2017-07-01 DIAGNOSIS — N186 End stage renal disease: Secondary | ICD-10-CM | POA: Diagnosis not present

## 2017-07-01 DIAGNOSIS — Z7401 Bed confinement status: Secondary | ICD-10-CM | POA: Diagnosis not present

## 2017-07-03 DIAGNOSIS — Z418 Encounter for other procedures for purposes other than remedying health state: Secondary | ICD-10-CM | POA: Diagnosis not present

## 2017-07-03 DIAGNOSIS — N186 End stage renal disease: Secondary | ICD-10-CM | POA: Diagnosis not present

## 2017-07-03 DIAGNOSIS — N2581 Secondary hyperparathyroidism of renal origin: Secondary | ICD-10-CM | POA: Diagnosis not present

## 2017-07-03 DIAGNOSIS — Z7401 Bed confinement status: Secondary | ICD-10-CM | POA: Diagnosis not present

## 2017-07-03 DIAGNOSIS — M2550 Pain in unspecified joint: Secondary | ICD-10-CM | POA: Diagnosis not present

## 2017-07-03 DIAGNOSIS — D631 Anemia in chronic kidney disease: Secondary | ICD-10-CM | POA: Diagnosis not present

## 2017-07-05 DIAGNOSIS — Z418 Encounter for other procedures for purposes other than remedying health state: Secondary | ICD-10-CM | POA: Diagnosis not present

## 2017-07-05 DIAGNOSIS — M2550 Pain in unspecified joint: Secondary | ICD-10-CM | POA: Diagnosis not present

## 2017-07-05 DIAGNOSIS — D631 Anemia in chronic kidney disease: Secondary | ICD-10-CM | POA: Diagnosis not present

## 2017-07-05 DIAGNOSIS — N2581 Secondary hyperparathyroidism of renal origin: Secondary | ICD-10-CM | POA: Diagnosis not present

## 2017-07-05 DIAGNOSIS — N186 End stage renal disease: Secondary | ICD-10-CM | POA: Diagnosis not present

## 2017-07-05 DIAGNOSIS — Z7401 Bed confinement status: Secondary | ICD-10-CM | POA: Diagnosis not present

## 2017-07-08 DIAGNOSIS — M2550 Pain in unspecified joint: Secondary | ICD-10-CM | POA: Diagnosis not present

## 2017-07-08 DIAGNOSIS — N186 End stage renal disease: Secondary | ICD-10-CM | POA: Diagnosis not present

## 2017-07-08 DIAGNOSIS — D631 Anemia in chronic kidney disease: Secondary | ICD-10-CM | POA: Diagnosis not present

## 2017-07-08 DIAGNOSIS — Z418 Encounter for other procedures for purposes other than remedying health state: Secondary | ICD-10-CM | POA: Diagnosis not present

## 2017-07-08 DIAGNOSIS — Z7401 Bed confinement status: Secondary | ICD-10-CM | POA: Diagnosis not present

## 2017-07-08 DIAGNOSIS — N2581 Secondary hyperparathyroidism of renal origin: Secondary | ICD-10-CM | POA: Diagnosis not present

## 2017-07-10 DIAGNOSIS — M2550 Pain in unspecified joint: Secondary | ICD-10-CM | POA: Diagnosis not present

## 2017-07-10 DIAGNOSIS — N186 End stage renal disease: Secondary | ICD-10-CM | POA: Diagnosis not present

## 2017-07-10 DIAGNOSIS — D631 Anemia in chronic kidney disease: Secondary | ICD-10-CM | POA: Diagnosis not present

## 2017-07-10 DIAGNOSIS — D649 Anemia, unspecified: Secondary | ICD-10-CM | POA: Diagnosis not present

## 2017-07-10 DIAGNOSIS — I10 Essential (primary) hypertension: Secondary | ICD-10-CM | POA: Diagnosis not present

## 2017-07-10 DIAGNOSIS — Z418 Encounter for other procedures for purposes other than remedying health state: Secondary | ICD-10-CM | POA: Diagnosis not present

## 2017-07-10 DIAGNOSIS — N2581 Secondary hyperparathyroidism of renal origin: Secondary | ICD-10-CM | POA: Diagnosis not present

## 2017-07-10 DIAGNOSIS — E119 Type 2 diabetes mellitus without complications: Secondary | ICD-10-CM | POA: Diagnosis not present

## 2017-07-10 DIAGNOSIS — Z7401 Bed confinement status: Secondary | ICD-10-CM | POA: Diagnosis not present

## 2017-07-10 DIAGNOSIS — M6281 Muscle weakness (generalized): Secondary | ICD-10-CM | POA: Diagnosis not present

## 2017-07-12 DIAGNOSIS — D509 Iron deficiency anemia, unspecified: Secondary | ICD-10-CM | POA: Diagnosis not present

## 2017-07-12 DIAGNOSIS — N2581 Secondary hyperparathyroidism of renal origin: Secondary | ICD-10-CM | POA: Diagnosis not present

## 2017-07-12 DIAGNOSIS — D631 Anemia in chronic kidney disease: Secondary | ICD-10-CM | POA: Diagnosis not present

## 2017-07-12 DIAGNOSIS — T8249XA Other complication of vascular dialysis catheter, initial encounter: Secondary | ICD-10-CM | POA: Diagnosis not present

## 2017-07-12 DIAGNOSIS — R279 Unspecified lack of coordination: Secondary | ICD-10-CM | POA: Diagnosis not present

## 2017-07-12 DIAGNOSIS — I10 Essential (primary) hypertension: Secondary | ICD-10-CM | POA: Diagnosis not present

## 2017-07-12 DIAGNOSIS — N186 End stage renal disease: Secondary | ICD-10-CM | POA: Diagnosis not present

## 2017-07-12 DIAGNOSIS — Z743 Need for continuous supervision: Secondary | ICD-10-CM | POA: Diagnosis not present

## 2017-07-12 DIAGNOSIS — Z418 Encounter for other procedures for purposes other than remedying health state: Secondary | ICD-10-CM | POA: Diagnosis not present

## 2017-07-15 DIAGNOSIS — N186 End stage renal disease: Secondary | ICD-10-CM | POA: Diagnosis not present

## 2017-07-15 DIAGNOSIS — D631 Anemia in chronic kidney disease: Secondary | ICD-10-CM | POA: Diagnosis not present

## 2017-07-15 DIAGNOSIS — D509 Iron deficiency anemia, unspecified: Secondary | ICD-10-CM | POA: Diagnosis not present

## 2017-07-15 DIAGNOSIS — Z418 Encounter for other procedures for purposes other than remedying health state: Secondary | ICD-10-CM | POA: Diagnosis not present

## 2017-07-15 DIAGNOSIS — Z7401 Bed confinement status: Secondary | ICD-10-CM | POA: Diagnosis not present

## 2017-07-15 DIAGNOSIS — M2550 Pain in unspecified joint: Secondary | ICD-10-CM | POA: Diagnosis not present

## 2017-07-15 DIAGNOSIS — Z743 Need for continuous supervision: Secondary | ICD-10-CM | POA: Diagnosis not present

## 2017-07-15 DIAGNOSIS — T8249XA Other complication of vascular dialysis catheter, initial encounter: Secondary | ICD-10-CM | POA: Diagnosis not present

## 2017-07-15 DIAGNOSIS — N2581 Secondary hyperparathyroidism of renal origin: Secondary | ICD-10-CM | POA: Diagnosis not present

## 2017-07-16 DIAGNOSIS — I10 Essential (primary) hypertension: Secondary | ICD-10-CM | POA: Diagnosis not present

## 2017-07-16 DIAGNOSIS — E119 Type 2 diabetes mellitus without complications: Secondary | ICD-10-CM | POA: Diagnosis not present

## 2017-07-16 DIAGNOSIS — D649 Anemia, unspecified: Secondary | ICD-10-CM | POA: Diagnosis not present

## 2017-07-16 DIAGNOSIS — M150 Primary generalized (osteo)arthritis: Secondary | ICD-10-CM | POA: Diagnosis not present

## 2017-07-17 DIAGNOSIS — M109 Gout, unspecified: Secondary | ICD-10-CM | POA: Diagnosis not present

## 2017-07-17 DIAGNOSIS — E119 Type 2 diabetes mellitus without complications: Secondary | ICD-10-CM | POA: Diagnosis not present

## 2017-07-17 DIAGNOSIS — Z7401 Bed confinement status: Secondary | ICD-10-CM | POA: Diagnosis not present

## 2017-07-17 DIAGNOSIS — D631 Anemia in chronic kidney disease: Secondary | ICD-10-CM | POA: Diagnosis not present

## 2017-07-17 DIAGNOSIS — Z418 Encounter for other procedures for purposes other than remedying health state: Secondary | ICD-10-CM | POA: Diagnosis not present

## 2017-07-17 DIAGNOSIS — D509 Iron deficiency anemia, unspecified: Secondary | ICD-10-CM | POA: Diagnosis not present

## 2017-07-17 DIAGNOSIS — N2581 Secondary hyperparathyroidism of renal origin: Secondary | ICD-10-CM | POA: Diagnosis not present

## 2017-07-17 DIAGNOSIS — M2550 Pain in unspecified joint: Secondary | ICD-10-CM | POA: Diagnosis not present

## 2017-07-17 DIAGNOSIS — T8249XA Other complication of vascular dialysis catheter, initial encounter: Secondary | ICD-10-CM | POA: Diagnosis not present

## 2017-07-17 DIAGNOSIS — N186 End stage renal disease: Secondary | ICD-10-CM | POA: Diagnosis not present

## 2017-07-19 DIAGNOSIS — Z418 Encounter for other procedures for purposes other than remedying health state: Secondary | ICD-10-CM | POA: Diagnosis not present

## 2017-07-19 DIAGNOSIS — N2581 Secondary hyperparathyroidism of renal origin: Secondary | ICD-10-CM | POA: Diagnosis not present

## 2017-07-19 DIAGNOSIS — T8249XA Other complication of vascular dialysis catheter, initial encounter: Secondary | ICD-10-CM | POA: Diagnosis not present

## 2017-07-19 DIAGNOSIS — M2550 Pain in unspecified joint: Secondary | ICD-10-CM | POA: Diagnosis not present

## 2017-07-19 DIAGNOSIS — N186 End stage renal disease: Secondary | ICD-10-CM | POA: Diagnosis not present

## 2017-07-19 DIAGNOSIS — Z7401 Bed confinement status: Secondary | ICD-10-CM | POA: Diagnosis not present

## 2017-07-19 DIAGNOSIS — D631 Anemia in chronic kidney disease: Secondary | ICD-10-CM | POA: Diagnosis not present

## 2017-07-19 DIAGNOSIS — D509 Iron deficiency anemia, unspecified: Secondary | ICD-10-CM | POA: Diagnosis not present

## 2017-07-22 DIAGNOSIS — M2550 Pain in unspecified joint: Secondary | ICD-10-CM | POA: Diagnosis not present

## 2017-07-22 DIAGNOSIS — I739 Peripheral vascular disease, unspecified: Secondary | ICD-10-CM | POA: Diagnosis not present

## 2017-07-22 DIAGNOSIS — N2581 Secondary hyperparathyroidism of renal origin: Secondary | ICD-10-CM | POA: Diagnosis not present

## 2017-07-22 DIAGNOSIS — N186 End stage renal disease: Secondary | ICD-10-CM | POA: Diagnosis not present

## 2017-07-22 DIAGNOSIS — T8249XA Other complication of vascular dialysis catheter, initial encounter: Secondary | ICD-10-CM | POA: Diagnosis not present

## 2017-07-22 DIAGNOSIS — M109 Gout, unspecified: Secondary | ICD-10-CM | POA: Diagnosis not present

## 2017-07-22 DIAGNOSIS — I5022 Chronic systolic (congestive) heart failure: Secondary | ICD-10-CM | POA: Diagnosis not present

## 2017-07-22 DIAGNOSIS — I10 Essential (primary) hypertension: Secondary | ICD-10-CM | POA: Diagnosis not present

## 2017-07-22 DIAGNOSIS — D509 Iron deficiency anemia, unspecified: Secondary | ICD-10-CM | POA: Diagnosis not present

## 2017-07-22 DIAGNOSIS — D631 Anemia in chronic kidney disease: Secondary | ICD-10-CM | POA: Diagnosis not present

## 2017-07-22 DIAGNOSIS — Z418 Encounter for other procedures for purposes other than remedying health state: Secondary | ICD-10-CM | POA: Diagnosis not present

## 2017-07-22 DIAGNOSIS — Z7401 Bed confinement status: Secondary | ICD-10-CM | POA: Diagnosis not present

## 2017-07-24 DIAGNOSIS — N186 End stage renal disease: Secondary | ICD-10-CM | POA: Diagnosis not present

## 2017-07-24 DIAGNOSIS — D631 Anemia in chronic kidney disease: Secondary | ICD-10-CM | POA: Diagnosis not present

## 2017-07-24 DIAGNOSIS — N2581 Secondary hyperparathyroidism of renal origin: Secondary | ICD-10-CM | POA: Diagnosis not present

## 2017-07-24 DIAGNOSIS — Z418 Encounter for other procedures for purposes other than remedying health state: Secondary | ICD-10-CM | POA: Diagnosis not present

## 2017-07-24 DIAGNOSIS — T8249XA Other complication of vascular dialysis catheter, initial encounter: Secondary | ICD-10-CM | POA: Diagnosis not present

## 2017-07-24 DIAGNOSIS — D509 Iron deficiency anemia, unspecified: Secondary | ICD-10-CM | POA: Diagnosis not present

## 2017-07-24 DIAGNOSIS — Z7401 Bed confinement status: Secondary | ICD-10-CM | POA: Diagnosis not present

## 2017-07-24 DIAGNOSIS — M2550 Pain in unspecified joint: Secondary | ICD-10-CM | POA: Diagnosis not present

## 2017-07-26 DIAGNOSIS — N2581 Secondary hyperparathyroidism of renal origin: Secondary | ICD-10-CM | POA: Diagnosis not present

## 2017-07-26 DIAGNOSIS — Z743 Need for continuous supervision: Secondary | ICD-10-CM | POA: Diagnosis not present

## 2017-07-26 DIAGNOSIS — T8249XA Other complication of vascular dialysis catheter, initial encounter: Secondary | ICD-10-CM | POA: Diagnosis not present

## 2017-07-26 DIAGNOSIS — R279 Unspecified lack of coordination: Secondary | ICD-10-CM | POA: Diagnosis not present

## 2017-07-26 DIAGNOSIS — Z418 Encounter for other procedures for purposes other than remedying health state: Secondary | ICD-10-CM | POA: Diagnosis not present

## 2017-07-26 DIAGNOSIS — D631 Anemia in chronic kidney disease: Secondary | ICD-10-CM | POA: Diagnosis not present

## 2017-07-26 DIAGNOSIS — N186 End stage renal disease: Secondary | ICD-10-CM | POA: Diagnosis not present

## 2017-07-26 DIAGNOSIS — D509 Iron deficiency anemia, unspecified: Secondary | ICD-10-CM | POA: Diagnosis not present

## 2017-07-29 DIAGNOSIS — N186 End stage renal disease: Secondary | ICD-10-CM | POA: Diagnosis not present

## 2017-07-29 DIAGNOSIS — D509 Iron deficiency anemia, unspecified: Secondary | ICD-10-CM | POA: Diagnosis not present

## 2017-07-29 DIAGNOSIS — N2581 Secondary hyperparathyroidism of renal origin: Secondary | ICD-10-CM | POA: Diagnosis not present

## 2017-07-29 DIAGNOSIS — D631 Anemia in chronic kidney disease: Secondary | ICD-10-CM | POA: Diagnosis not present

## 2017-07-29 DIAGNOSIS — Z418 Encounter for other procedures for purposes other than remedying health state: Secondary | ICD-10-CM | POA: Diagnosis not present

## 2017-07-29 DIAGNOSIS — M2550 Pain in unspecified joint: Secondary | ICD-10-CM | POA: Diagnosis not present

## 2017-07-29 DIAGNOSIS — Z7401 Bed confinement status: Secondary | ICD-10-CM | POA: Diagnosis not present

## 2017-07-29 DIAGNOSIS — T8249XA Other complication of vascular dialysis catheter, initial encounter: Secondary | ICD-10-CM | POA: Diagnosis not present

## 2017-07-31 DIAGNOSIS — Z7401 Bed confinement status: Secondary | ICD-10-CM | POA: Diagnosis not present

## 2017-07-31 DIAGNOSIS — M2550 Pain in unspecified joint: Secondary | ICD-10-CM | POA: Diagnosis not present

## 2017-07-31 DIAGNOSIS — Z418 Encounter for other procedures for purposes other than remedying health state: Secondary | ICD-10-CM | POA: Diagnosis not present

## 2017-07-31 DIAGNOSIS — N186 End stage renal disease: Secondary | ICD-10-CM | POA: Diagnosis not present

## 2017-07-31 DIAGNOSIS — T8249XA Other complication of vascular dialysis catheter, initial encounter: Secondary | ICD-10-CM | POA: Diagnosis not present

## 2017-07-31 DIAGNOSIS — N2581 Secondary hyperparathyroidism of renal origin: Secondary | ICD-10-CM | POA: Diagnosis not present

## 2017-07-31 DIAGNOSIS — D631 Anemia in chronic kidney disease: Secondary | ICD-10-CM | POA: Diagnosis not present

## 2017-07-31 DIAGNOSIS — D509 Iron deficiency anemia, unspecified: Secondary | ICD-10-CM | POA: Diagnosis not present

## 2017-08-02 DIAGNOSIS — N186 End stage renal disease: Secondary | ICD-10-CM | POA: Diagnosis not present

## 2017-08-02 DIAGNOSIS — D631 Anemia in chronic kidney disease: Secondary | ICD-10-CM | POA: Diagnosis not present

## 2017-08-02 DIAGNOSIS — D509 Iron deficiency anemia, unspecified: Secondary | ICD-10-CM | POA: Diagnosis not present

## 2017-08-02 DIAGNOSIS — Z418 Encounter for other procedures for purposes other than remedying health state: Secondary | ICD-10-CM | POA: Diagnosis not present

## 2017-08-02 DIAGNOSIS — N2581 Secondary hyperparathyroidism of renal origin: Secondary | ICD-10-CM | POA: Diagnosis not present

## 2017-08-02 DIAGNOSIS — M2550 Pain in unspecified joint: Secondary | ICD-10-CM | POA: Diagnosis not present

## 2017-08-02 DIAGNOSIS — Z7401 Bed confinement status: Secondary | ICD-10-CM | POA: Diagnosis not present

## 2017-08-02 DIAGNOSIS — T8249XA Other complication of vascular dialysis catheter, initial encounter: Secondary | ICD-10-CM | POA: Diagnosis not present

## 2017-08-05 DIAGNOSIS — M2550 Pain in unspecified joint: Secondary | ICD-10-CM | POA: Diagnosis not present

## 2017-08-05 DIAGNOSIS — T8249XA Other complication of vascular dialysis catheter, initial encounter: Secondary | ICD-10-CM | POA: Diagnosis not present

## 2017-08-05 DIAGNOSIS — Z418 Encounter for other procedures for purposes other than remedying health state: Secondary | ICD-10-CM | POA: Diagnosis not present

## 2017-08-05 DIAGNOSIS — Z7401 Bed confinement status: Secondary | ICD-10-CM | POA: Diagnosis not present

## 2017-08-05 DIAGNOSIS — D631 Anemia in chronic kidney disease: Secondary | ICD-10-CM | POA: Diagnosis not present

## 2017-08-05 DIAGNOSIS — D509 Iron deficiency anemia, unspecified: Secondary | ICD-10-CM | POA: Diagnosis not present

## 2017-08-05 DIAGNOSIS — N186 End stage renal disease: Secondary | ICD-10-CM | POA: Diagnosis not present

## 2017-08-05 DIAGNOSIS — N2581 Secondary hyperparathyroidism of renal origin: Secondary | ICD-10-CM | POA: Diagnosis not present

## 2017-08-07 DIAGNOSIS — Z418 Encounter for other procedures for purposes other than remedying health state: Secondary | ICD-10-CM | POA: Diagnosis not present

## 2017-08-07 DIAGNOSIS — Z7401 Bed confinement status: Secondary | ICD-10-CM | POA: Diagnosis not present

## 2017-08-07 DIAGNOSIS — D509 Iron deficiency anemia, unspecified: Secondary | ICD-10-CM | POA: Diagnosis not present

## 2017-08-07 DIAGNOSIS — M2550 Pain in unspecified joint: Secondary | ICD-10-CM | POA: Diagnosis not present

## 2017-08-07 DIAGNOSIS — N2581 Secondary hyperparathyroidism of renal origin: Secondary | ICD-10-CM | POA: Diagnosis not present

## 2017-08-07 DIAGNOSIS — N186 End stage renal disease: Secondary | ICD-10-CM | POA: Diagnosis not present

## 2017-08-07 DIAGNOSIS — D631 Anemia in chronic kidney disease: Secondary | ICD-10-CM | POA: Diagnosis not present

## 2017-08-07 DIAGNOSIS — T8249XA Other complication of vascular dialysis catheter, initial encounter: Secondary | ICD-10-CM | POA: Diagnosis not present

## 2017-08-09 DIAGNOSIS — Z418 Encounter for other procedures for purposes other than remedying health state: Secondary | ICD-10-CM | POA: Diagnosis not present

## 2017-08-09 DIAGNOSIS — N2581 Secondary hyperparathyroidism of renal origin: Secondary | ICD-10-CM | POA: Diagnosis not present

## 2017-08-09 DIAGNOSIS — Z7401 Bed confinement status: Secondary | ICD-10-CM | POA: Diagnosis not present

## 2017-08-09 DIAGNOSIS — D631 Anemia in chronic kidney disease: Secondary | ICD-10-CM | POA: Diagnosis not present

## 2017-08-09 DIAGNOSIS — M2550 Pain in unspecified joint: Secondary | ICD-10-CM | POA: Diagnosis not present

## 2017-08-09 DIAGNOSIS — I10 Essential (primary) hypertension: Secondary | ICD-10-CM | POA: Diagnosis not present

## 2017-08-09 DIAGNOSIS — N186 End stage renal disease: Secondary | ICD-10-CM | POA: Diagnosis not present

## 2017-08-09 DIAGNOSIS — D509 Iron deficiency anemia, unspecified: Secondary | ICD-10-CM | POA: Diagnosis not present

## 2017-08-12 DIAGNOSIS — N186 End stage renal disease: Secondary | ICD-10-CM | POA: Diagnosis not present

## 2017-08-12 DIAGNOSIS — Z418 Encounter for other procedures for purposes other than remedying health state: Secondary | ICD-10-CM | POA: Diagnosis not present

## 2017-08-12 DIAGNOSIS — Z7401 Bed confinement status: Secondary | ICD-10-CM | POA: Diagnosis not present

## 2017-08-12 DIAGNOSIS — M2550 Pain in unspecified joint: Secondary | ICD-10-CM | POA: Diagnosis not present

## 2017-08-12 DIAGNOSIS — D631 Anemia in chronic kidney disease: Secondary | ICD-10-CM | POA: Diagnosis not present

## 2017-08-12 DIAGNOSIS — D509 Iron deficiency anemia, unspecified: Secondary | ICD-10-CM | POA: Diagnosis not present

## 2017-08-12 DIAGNOSIS — N2581 Secondary hyperparathyroidism of renal origin: Secondary | ICD-10-CM | POA: Diagnosis not present

## 2017-08-14 DIAGNOSIS — E119 Type 2 diabetes mellitus without complications: Secondary | ICD-10-CM | POA: Diagnosis not present

## 2017-08-14 DIAGNOSIS — M2550 Pain in unspecified joint: Secondary | ICD-10-CM | POA: Diagnosis not present

## 2017-08-14 DIAGNOSIS — Z7401 Bed confinement status: Secondary | ICD-10-CM | POA: Diagnosis not present

## 2017-08-14 DIAGNOSIS — D631 Anemia in chronic kidney disease: Secondary | ICD-10-CM | POA: Diagnosis not present

## 2017-08-14 DIAGNOSIS — N186 End stage renal disease: Secondary | ICD-10-CM | POA: Diagnosis not present

## 2017-08-14 DIAGNOSIS — D509 Iron deficiency anemia, unspecified: Secondary | ICD-10-CM | POA: Diagnosis not present

## 2017-08-14 DIAGNOSIS — M109 Gout, unspecified: Secondary | ICD-10-CM | POA: Diagnosis not present

## 2017-08-14 DIAGNOSIS — Z418 Encounter for other procedures for purposes other than remedying health state: Secondary | ICD-10-CM | POA: Diagnosis not present

## 2017-08-14 DIAGNOSIS — N2581 Secondary hyperparathyroidism of renal origin: Secondary | ICD-10-CM | POA: Diagnosis not present

## 2017-08-15 DIAGNOSIS — F0631 Mood disorder due to known physiological condition with depressive features: Secondary | ICD-10-CM | POA: Diagnosis not present

## 2017-08-15 DIAGNOSIS — G4700 Insomnia, unspecified: Secondary | ICD-10-CM | POA: Diagnosis not present

## 2017-08-15 DIAGNOSIS — I69398 Other sequelae of cerebral infarction: Secondary | ICD-10-CM | POA: Diagnosis not present

## 2017-08-16 DIAGNOSIS — M2550 Pain in unspecified joint: Secondary | ICD-10-CM | POA: Diagnosis not present

## 2017-08-16 DIAGNOSIS — Z743 Need for continuous supervision: Secondary | ICD-10-CM | POA: Diagnosis not present

## 2017-08-16 DIAGNOSIS — D509 Iron deficiency anemia, unspecified: Secondary | ICD-10-CM | POA: Diagnosis not present

## 2017-08-16 DIAGNOSIS — Z7401 Bed confinement status: Secondary | ICD-10-CM | POA: Diagnosis not present

## 2017-08-16 DIAGNOSIS — R279 Unspecified lack of coordination: Secondary | ICD-10-CM | POA: Diagnosis not present

## 2017-08-16 DIAGNOSIS — D631 Anemia in chronic kidney disease: Secondary | ICD-10-CM | POA: Diagnosis not present

## 2017-08-16 DIAGNOSIS — N186 End stage renal disease: Secondary | ICD-10-CM | POA: Diagnosis not present

## 2017-08-16 DIAGNOSIS — N2581 Secondary hyperparathyroidism of renal origin: Secondary | ICD-10-CM | POA: Diagnosis not present

## 2017-08-16 DIAGNOSIS — Z418 Encounter for other procedures for purposes other than remedying health state: Secondary | ICD-10-CM | POA: Diagnosis not present

## 2017-08-19 DIAGNOSIS — I120 Hypertensive chronic kidney disease with stage 5 chronic kidney disease or end stage renal disease: Secondary | ICD-10-CM | POA: Diagnosis not present

## 2017-08-19 DIAGNOSIS — N2581 Secondary hyperparathyroidism of renal origin: Secondary | ICD-10-CM | POA: Diagnosis not present

## 2017-08-19 DIAGNOSIS — E119 Type 2 diabetes mellitus without complications: Secondary | ICD-10-CM | POA: Diagnosis not present

## 2017-08-19 DIAGNOSIS — R140 Abdominal distension (gaseous): Secondary | ICD-10-CM | POA: Diagnosis not present

## 2017-08-19 DIAGNOSIS — N186 End stage renal disease: Secondary | ICD-10-CM | POA: Diagnosis not present

## 2017-08-19 DIAGNOSIS — D631 Anemia in chronic kidney disease: Secondary | ICD-10-CM | POA: Diagnosis not present

## 2017-08-19 DIAGNOSIS — E1122 Type 2 diabetes mellitus with diabetic chronic kidney disease: Secondary | ICD-10-CM | POA: Diagnosis not present

## 2017-08-19 DIAGNOSIS — R100 Acute abdomen: Secondary | ICD-10-CM | POA: Diagnosis not present

## 2017-08-19 DIAGNOSIS — M150 Primary generalized (osteo)arthritis: Secondary | ICD-10-CM | POA: Diagnosis not present

## 2017-08-19 DIAGNOSIS — Z7401 Bed confinement status: Secondary | ICD-10-CM | POA: Diagnosis not present

## 2017-08-19 DIAGNOSIS — Z418 Encounter for other procedures for purposes other than remedying health state: Secondary | ICD-10-CM | POA: Diagnosis not present

## 2017-08-19 DIAGNOSIS — D649 Anemia, unspecified: Secondary | ICD-10-CM | POA: Diagnosis not present

## 2017-08-19 DIAGNOSIS — D509 Iron deficiency anemia, unspecified: Secondary | ICD-10-CM | POA: Diagnosis not present

## 2017-08-19 DIAGNOSIS — R569 Unspecified convulsions: Secondary | ICD-10-CM | POA: Diagnosis not present

## 2017-08-19 DIAGNOSIS — M2550 Pain in unspecified joint: Secondary | ICD-10-CM | POA: Diagnosis not present

## 2017-08-19 DIAGNOSIS — I10 Essential (primary) hypertension: Secondary | ICD-10-CM | POA: Diagnosis not present

## 2017-08-19 DIAGNOSIS — R4182 Altered mental status, unspecified: Secondary | ICD-10-CM | POA: Diagnosis not present

## 2017-08-21 DIAGNOSIS — N186 End stage renal disease: Secondary | ICD-10-CM | POA: Diagnosis not present

## 2017-08-21 DIAGNOSIS — D631 Anemia in chronic kidney disease: Secondary | ICD-10-CM | POA: Diagnosis not present

## 2017-08-21 DIAGNOSIS — N2581 Secondary hyperparathyroidism of renal origin: Secondary | ICD-10-CM | POA: Diagnosis not present

## 2017-08-21 DIAGNOSIS — D509 Iron deficiency anemia, unspecified: Secondary | ICD-10-CM | POA: Diagnosis not present

## 2017-08-21 DIAGNOSIS — Z418 Encounter for other procedures for purposes other than remedying health state: Secondary | ICD-10-CM | POA: Diagnosis not present

## 2017-08-21 DIAGNOSIS — R279 Unspecified lack of coordination: Secondary | ICD-10-CM | POA: Diagnosis not present

## 2017-08-21 DIAGNOSIS — Z743 Need for continuous supervision: Secondary | ICD-10-CM | POA: Diagnosis not present

## 2017-08-23 DIAGNOSIS — N2581 Secondary hyperparathyroidism of renal origin: Secondary | ICD-10-CM | POA: Diagnosis not present

## 2017-08-23 DIAGNOSIS — M2550 Pain in unspecified joint: Secondary | ICD-10-CM | POA: Diagnosis not present

## 2017-08-23 DIAGNOSIS — D509 Iron deficiency anemia, unspecified: Secondary | ICD-10-CM | POA: Diagnosis not present

## 2017-08-23 DIAGNOSIS — Z418 Encounter for other procedures for purposes other than remedying health state: Secondary | ICD-10-CM | POA: Diagnosis not present

## 2017-08-23 DIAGNOSIS — D631 Anemia in chronic kidney disease: Secondary | ICD-10-CM | POA: Diagnosis not present

## 2017-08-23 DIAGNOSIS — N186 End stage renal disease: Secondary | ICD-10-CM | POA: Diagnosis not present

## 2017-08-23 DIAGNOSIS — Z7401 Bed confinement status: Secondary | ICD-10-CM | POA: Diagnosis not present

## 2017-08-26 DIAGNOSIS — N186 End stage renal disease: Secondary | ICD-10-CM | POA: Diagnosis not present

## 2017-08-26 DIAGNOSIS — I5022 Chronic systolic (congestive) heart failure: Secondary | ICD-10-CM | POA: Diagnosis not present

## 2017-08-26 DIAGNOSIS — D509 Iron deficiency anemia, unspecified: Secondary | ICD-10-CM | POA: Diagnosis not present

## 2017-08-26 DIAGNOSIS — N2581 Secondary hyperparathyroidism of renal origin: Secondary | ICD-10-CM | POA: Diagnosis not present

## 2017-08-26 DIAGNOSIS — D631 Anemia in chronic kidney disease: Secondary | ICD-10-CM | POA: Diagnosis not present

## 2017-08-26 DIAGNOSIS — M2550 Pain in unspecified joint: Secondary | ICD-10-CM | POA: Diagnosis not present

## 2017-08-26 DIAGNOSIS — M6281 Muscle weakness (generalized): Secondary | ICD-10-CM | POA: Diagnosis not present

## 2017-08-26 DIAGNOSIS — I10 Essential (primary) hypertension: Secondary | ICD-10-CM | POA: Diagnosis not present

## 2017-08-26 DIAGNOSIS — G40909 Epilepsy, unspecified, not intractable, without status epilepticus: Secondary | ICD-10-CM | POA: Diagnosis not present

## 2017-08-26 DIAGNOSIS — Z7401 Bed confinement status: Secondary | ICD-10-CM | POA: Diagnosis not present

## 2017-08-26 DIAGNOSIS — Z418 Encounter for other procedures for purposes other than remedying health state: Secondary | ICD-10-CM | POA: Diagnosis not present

## 2017-08-28 DIAGNOSIS — M2550 Pain in unspecified joint: Secondary | ICD-10-CM | POA: Diagnosis not present

## 2017-08-28 DIAGNOSIS — Z7401 Bed confinement status: Secondary | ICD-10-CM | POA: Diagnosis not present

## 2017-08-28 DIAGNOSIS — N2581 Secondary hyperparathyroidism of renal origin: Secondary | ICD-10-CM | POA: Diagnosis not present

## 2017-08-28 DIAGNOSIS — N186 End stage renal disease: Secondary | ICD-10-CM | POA: Diagnosis not present

## 2017-08-28 DIAGNOSIS — Z418 Encounter for other procedures for purposes other than remedying health state: Secondary | ICD-10-CM | POA: Diagnosis not present

## 2017-08-28 DIAGNOSIS — D509 Iron deficiency anemia, unspecified: Secondary | ICD-10-CM | POA: Diagnosis not present

## 2017-08-28 DIAGNOSIS — D631 Anemia in chronic kidney disease: Secondary | ICD-10-CM | POA: Diagnosis not present

## 2017-08-30 DIAGNOSIS — Z7401 Bed confinement status: Secondary | ICD-10-CM | POA: Diagnosis not present

## 2017-08-30 DIAGNOSIS — Z418 Encounter for other procedures for purposes other than remedying health state: Secondary | ICD-10-CM | POA: Diagnosis not present

## 2017-08-30 DIAGNOSIS — M2550 Pain in unspecified joint: Secondary | ICD-10-CM | POA: Diagnosis not present

## 2017-08-30 DIAGNOSIS — N186 End stage renal disease: Secondary | ICD-10-CM | POA: Diagnosis not present

## 2017-08-30 DIAGNOSIS — D631 Anemia in chronic kidney disease: Secondary | ICD-10-CM | POA: Diagnosis not present

## 2017-08-30 DIAGNOSIS — D509 Iron deficiency anemia, unspecified: Secondary | ICD-10-CM | POA: Diagnosis not present

## 2017-08-30 DIAGNOSIS — N2581 Secondary hyperparathyroidism of renal origin: Secondary | ICD-10-CM | POA: Diagnosis not present

## 2017-09-02 DIAGNOSIS — I150 Renovascular hypertension: Secondary | ICD-10-CM | POA: Diagnosis not present

## 2017-09-02 DIAGNOSIS — D509 Iron deficiency anemia, unspecified: Secondary | ICD-10-CM | POA: Diagnosis not present

## 2017-09-02 DIAGNOSIS — N2581 Secondary hyperparathyroidism of renal origin: Secondary | ICD-10-CM | POA: Diagnosis not present

## 2017-09-02 DIAGNOSIS — R000 Tachycardia, unspecified: Secondary | ICD-10-CM | POA: Diagnosis not present

## 2017-09-02 DIAGNOSIS — Z418 Encounter for other procedures for purposes other than remedying health state: Secondary | ICD-10-CM | POA: Diagnosis not present

## 2017-09-02 DIAGNOSIS — N186 End stage renal disease: Secondary | ICD-10-CM | POA: Diagnosis not present

## 2017-09-02 DIAGNOSIS — Z992 Dependence on renal dialysis: Secondary | ICD-10-CM | POA: Diagnosis not present

## 2017-09-02 DIAGNOSIS — G40901 Epilepsy, unspecified, not intractable, with status epilepticus: Secondary | ICD-10-CM | POA: Diagnosis not present

## 2017-09-02 DIAGNOSIS — I120 Hypertensive chronic kidney disease with stage 5 chronic kidney disease or end stage renal disease: Secondary | ICD-10-CM | POA: Diagnosis not present

## 2017-09-02 DIAGNOSIS — E1122 Type 2 diabetes mellitus with diabetic chronic kidney disease: Secondary | ICD-10-CM | POA: Diagnosis not present

## 2017-09-02 DIAGNOSIS — D631 Anemia in chronic kidney disease: Secondary | ICD-10-CM | POA: Diagnosis not present

## 2017-09-02 DIAGNOSIS — R4182 Altered mental status, unspecified: Secondary | ICD-10-CM | POA: Diagnosis not present

## 2017-09-02 DIAGNOSIS — R569 Unspecified convulsions: Secondary | ICD-10-CM | POA: Diagnosis not present

## 2017-09-02 DIAGNOSIS — R55 Syncope and collapse: Secondary | ICD-10-CM | POA: Diagnosis not present

## 2017-09-02 DIAGNOSIS — M2550 Pain in unspecified joint: Secondary | ICD-10-CM | POA: Diagnosis not present

## 2017-09-02 DIAGNOSIS — Z7401 Bed confinement status: Secondary | ICD-10-CM | POA: Diagnosis not present

## 2017-09-04 DIAGNOSIS — M2550 Pain in unspecified joint: Secondary | ICD-10-CM | POA: Diagnosis not present

## 2017-09-04 DIAGNOSIS — N186 End stage renal disease: Secondary | ICD-10-CM | POA: Diagnosis not present

## 2017-09-04 DIAGNOSIS — Z7401 Bed confinement status: Secondary | ICD-10-CM | POA: Diagnosis not present

## 2017-09-04 DIAGNOSIS — N2581 Secondary hyperparathyroidism of renal origin: Secondary | ICD-10-CM | POA: Diagnosis not present

## 2017-09-04 DIAGNOSIS — D509 Iron deficiency anemia, unspecified: Secondary | ICD-10-CM | POA: Diagnosis not present

## 2017-09-04 DIAGNOSIS — Z743 Need for continuous supervision: Secondary | ICD-10-CM | POA: Diagnosis not present

## 2017-09-04 DIAGNOSIS — D631 Anemia in chronic kidney disease: Secondary | ICD-10-CM | POA: Diagnosis not present

## 2017-09-04 DIAGNOSIS — Z418 Encounter for other procedures for purposes other than remedying health state: Secondary | ICD-10-CM | POA: Diagnosis not present

## 2017-09-06 DIAGNOSIS — Z7401 Bed confinement status: Secondary | ICD-10-CM | POA: Diagnosis not present

## 2017-09-06 DIAGNOSIS — D509 Iron deficiency anemia, unspecified: Secondary | ICD-10-CM | POA: Diagnosis not present

## 2017-09-06 DIAGNOSIS — N186 End stage renal disease: Secondary | ICD-10-CM | POA: Diagnosis not present

## 2017-09-06 DIAGNOSIS — Z418 Encounter for other procedures for purposes other than remedying health state: Secondary | ICD-10-CM | POA: Diagnosis not present

## 2017-09-06 DIAGNOSIS — M2550 Pain in unspecified joint: Secondary | ICD-10-CM | POA: Diagnosis not present

## 2017-09-06 DIAGNOSIS — D631 Anemia in chronic kidney disease: Secondary | ICD-10-CM | POA: Diagnosis not present

## 2017-09-06 DIAGNOSIS — N2581 Secondary hyperparathyroidism of renal origin: Secondary | ICD-10-CM | POA: Diagnosis not present

## 2017-09-09 DIAGNOSIS — Z418 Encounter for other procedures for purposes other than remedying health state: Secondary | ICD-10-CM | POA: Diagnosis not present

## 2017-09-09 DIAGNOSIS — I5022 Chronic systolic (congestive) heart failure: Secondary | ICD-10-CM | POA: Diagnosis not present

## 2017-09-09 DIAGNOSIS — G40909 Epilepsy, unspecified, not intractable, without status epilepticus: Secondary | ICD-10-CM | POA: Diagnosis not present

## 2017-09-09 DIAGNOSIS — I150 Renovascular hypertension: Secondary | ICD-10-CM | POA: Diagnosis not present

## 2017-09-09 DIAGNOSIS — M545 Low back pain: Secondary | ICD-10-CM | POA: Diagnosis not present

## 2017-09-09 DIAGNOSIS — N186 End stage renal disease: Secondary | ICD-10-CM | POA: Diagnosis not present

## 2017-09-09 DIAGNOSIS — D631 Anemia in chronic kidney disease: Secondary | ICD-10-CM | POA: Diagnosis not present

## 2017-09-09 DIAGNOSIS — M2550 Pain in unspecified joint: Secondary | ICD-10-CM | POA: Diagnosis not present

## 2017-09-09 DIAGNOSIS — Z7401 Bed confinement status: Secondary | ICD-10-CM | POA: Diagnosis not present

## 2017-09-09 DIAGNOSIS — N2581 Secondary hyperparathyroidism of renal origin: Secondary | ICD-10-CM | POA: Diagnosis not present

## 2017-09-09 DIAGNOSIS — I10 Essential (primary) hypertension: Secondary | ICD-10-CM | POA: Diagnosis not present

## 2017-09-11 DIAGNOSIS — Z7401 Bed confinement status: Secondary | ICD-10-CM | POA: Diagnosis not present

## 2017-09-11 DIAGNOSIS — E119 Type 2 diabetes mellitus without complications: Secondary | ICD-10-CM | POA: Diagnosis not present

## 2017-09-11 DIAGNOSIS — D631 Anemia in chronic kidney disease: Secondary | ICD-10-CM | POA: Diagnosis not present

## 2017-09-11 DIAGNOSIS — I10 Essential (primary) hypertension: Secondary | ICD-10-CM | POA: Diagnosis not present

## 2017-09-11 DIAGNOSIS — N2581 Secondary hyperparathyroidism of renal origin: Secondary | ICD-10-CM | POA: Diagnosis not present

## 2017-09-11 DIAGNOSIS — M2550 Pain in unspecified joint: Secondary | ICD-10-CM | POA: Diagnosis not present

## 2017-09-11 DIAGNOSIS — N186 End stage renal disease: Secondary | ICD-10-CM | POA: Diagnosis not present

## 2017-09-11 DIAGNOSIS — M109 Gout, unspecified: Secondary | ICD-10-CM | POA: Diagnosis not present

## 2017-09-11 DIAGNOSIS — Z418 Encounter for other procedures for purposes other than remedying health state: Secondary | ICD-10-CM | POA: Diagnosis not present

## 2017-09-13 DIAGNOSIS — Z7401 Bed confinement status: Secondary | ICD-10-CM | POA: Diagnosis not present

## 2017-09-13 DIAGNOSIS — Z418 Encounter for other procedures for purposes other than remedying health state: Secondary | ICD-10-CM | POA: Diagnosis not present

## 2017-09-13 DIAGNOSIS — M2550 Pain in unspecified joint: Secondary | ICD-10-CM | POA: Diagnosis not present

## 2017-09-13 DIAGNOSIS — D631 Anemia in chronic kidney disease: Secondary | ICD-10-CM | POA: Diagnosis not present

## 2017-09-13 DIAGNOSIS — N2581 Secondary hyperparathyroidism of renal origin: Secondary | ICD-10-CM | POA: Diagnosis not present

## 2017-09-13 DIAGNOSIS — N186 End stage renal disease: Secondary | ICD-10-CM | POA: Diagnosis not present

## 2017-09-16 DIAGNOSIS — N186 End stage renal disease: Secondary | ICD-10-CM | POA: Diagnosis not present

## 2017-09-16 DIAGNOSIS — Z418 Encounter for other procedures for purposes other than remedying health state: Secondary | ICD-10-CM | POA: Diagnosis not present

## 2017-09-16 DIAGNOSIS — N2581 Secondary hyperparathyroidism of renal origin: Secondary | ICD-10-CM | POA: Diagnosis not present

## 2017-09-16 DIAGNOSIS — Z7401 Bed confinement status: Secondary | ICD-10-CM | POA: Diagnosis not present

## 2017-09-16 DIAGNOSIS — M2550 Pain in unspecified joint: Secondary | ICD-10-CM | POA: Diagnosis not present

## 2017-09-16 DIAGNOSIS — D631 Anemia in chronic kidney disease: Secondary | ICD-10-CM | POA: Diagnosis not present

## 2017-09-17 DIAGNOSIS — N186 End stage renal disease: Secondary | ICD-10-CM | POA: Diagnosis not present

## 2017-09-17 DIAGNOSIS — M24522 Contracture, left elbow: Secondary | ICD-10-CM | POA: Diagnosis not present

## 2017-09-17 DIAGNOSIS — R633 Feeding difficulties: Secondary | ICD-10-CM | POA: Diagnosis not present

## 2017-09-17 DIAGNOSIS — I482 Chronic atrial fibrillation: Secondary | ICD-10-CM | POA: Diagnosis not present

## 2017-09-17 DIAGNOSIS — E1142 Type 2 diabetes mellitus with diabetic polyneuropathy: Secondary | ICD-10-CM | POA: Diagnosis not present

## 2017-09-17 DIAGNOSIS — I10 Essential (primary) hypertension: Secondary | ICD-10-CM | POA: Diagnosis not present

## 2017-09-17 DIAGNOSIS — G40909 Epilepsy, unspecified, not intractable, without status epilepticus: Secondary | ICD-10-CM | POA: Diagnosis not present

## 2017-09-17 DIAGNOSIS — M6281 Muscle weakness (generalized): Secondary | ICD-10-CM | POA: Diagnosis not present

## 2017-09-17 DIAGNOSIS — R278 Other lack of coordination: Secondary | ICD-10-CM | POA: Diagnosis not present

## 2017-09-17 DIAGNOSIS — I5022 Chronic systolic (congestive) heart failure: Secondary | ICD-10-CM | POA: Diagnosis not present

## 2017-09-17 DIAGNOSIS — R293 Abnormal posture: Secondary | ICD-10-CM | POA: Diagnosis not present

## 2017-09-17 DIAGNOSIS — E1122 Type 2 diabetes mellitus with diabetic chronic kidney disease: Secondary | ICD-10-CM | POA: Diagnosis not present

## 2017-09-17 DIAGNOSIS — D649 Anemia, unspecified: Secondary | ICD-10-CM | POA: Diagnosis not present

## 2017-09-18 DIAGNOSIS — R293 Abnormal posture: Secondary | ICD-10-CM | POA: Diagnosis not present

## 2017-09-18 DIAGNOSIS — D631 Anemia in chronic kidney disease: Secondary | ICD-10-CM | POA: Diagnosis not present

## 2017-09-18 DIAGNOSIS — N2581 Secondary hyperparathyroidism of renal origin: Secondary | ICD-10-CM | POA: Diagnosis not present

## 2017-09-18 DIAGNOSIS — N186 End stage renal disease: Secondary | ICD-10-CM | POA: Diagnosis not present

## 2017-09-18 DIAGNOSIS — M6281 Muscle weakness (generalized): Secondary | ICD-10-CM | POA: Diagnosis not present

## 2017-09-18 DIAGNOSIS — Z418 Encounter for other procedures for purposes other than remedying health state: Secondary | ICD-10-CM | POA: Diagnosis not present

## 2017-09-18 DIAGNOSIS — R633 Feeding difficulties: Secondary | ICD-10-CM | POA: Diagnosis not present

## 2017-09-18 DIAGNOSIS — M2550 Pain in unspecified joint: Secondary | ICD-10-CM | POA: Diagnosis not present

## 2017-09-18 DIAGNOSIS — Z7401 Bed confinement status: Secondary | ICD-10-CM | POA: Diagnosis not present

## 2017-09-18 DIAGNOSIS — M24522 Contracture, left elbow: Secondary | ICD-10-CM | POA: Diagnosis not present

## 2017-09-18 DIAGNOSIS — R278 Other lack of coordination: Secondary | ICD-10-CM | POA: Diagnosis not present

## 2017-09-20 DIAGNOSIS — N186 End stage renal disease: Secondary | ICD-10-CM | POA: Diagnosis not present

## 2017-09-20 DIAGNOSIS — N2581 Secondary hyperparathyroidism of renal origin: Secondary | ICD-10-CM | POA: Diagnosis not present

## 2017-09-20 DIAGNOSIS — M2550 Pain in unspecified joint: Secondary | ICD-10-CM | POA: Diagnosis not present

## 2017-09-20 DIAGNOSIS — Z418 Encounter for other procedures for purposes other than remedying health state: Secondary | ICD-10-CM | POA: Diagnosis not present

## 2017-09-20 DIAGNOSIS — Z7401 Bed confinement status: Secondary | ICD-10-CM | POA: Diagnosis not present

## 2017-09-20 DIAGNOSIS — D631 Anemia in chronic kidney disease: Secondary | ICD-10-CM | POA: Diagnosis not present

## 2017-09-21 DIAGNOSIS — M6281 Muscle weakness (generalized): Secondary | ICD-10-CM | POA: Diagnosis not present

## 2017-09-21 DIAGNOSIS — M24522 Contracture, left elbow: Secondary | ICD-10-CM | POA: Diagnosis not present

## 2017-09-21 DIAGNOSIS — R633 Feeding difficulties: Secondary | ICD-10-CM | POA: Diagnosis not present

## 2017-09-21 DIAGNOSIS — R278 Other lack of coordination: Secondary | ICD-10-CM | POA: Diagnosis not present

## 2017-09-21 DIAGNOSIS — R293 Abnormal posture: Secondary | ICD-10-CM | POA: Diagnosis not present

## 2017-09-21 DIAGNOSIS — N186 End stage renal disease: Secondary | ICD-10-CM | POA: Diagnosis not present

## 2017-09-23 DIAGNOSIS — M109 Gout, unspecified: Secondary | ICD-10-CM | POA: Diagnosis not present

## 2017-09-23 DIAGNOSIS — M6281 Muscle weakness (generalized): Secondary | ICD-10-CM | POA: Diagnosis not present

## 2017-09-23 DIAGNOSIS — I10 Essential (primary) hypertension: Secondary | ICD-10-CM | POA: Diagnosis not present

## 2017-09-23 DIAGNOSIS — M24522 Contracture, left elbow: Secondary | ICD-10-CM | POA: Diagnosis not present

## 2017-09-23 DIAGNOSIS — R278 Other lack of coordination: Secondary | ICD-10-CM | POA: Diagnosis not present

## 2017-09-23 DIAGNOSIS — N2581 Secondary hyperparathyroidism of renal origin: Secondary | ICD-10-CM | POA: Diagnosis not present

## 2017-09-23 DIAGNOSIS — N186 End stage renal disease: Secondary | ICD-10-CM | POA: Diagnosis not present

## 2017-09-23 DIAGNOSIS — D631 Anemia in chronic kidney disease: Secondary | ICD-10-CM | POA: Diagnosis not present

## 2017-09-23 DIAGNOSIS — I5022 Chronic systolic (congestive) heart failure: Secondary | ICD-10-CM | POA: Diagnosis not present

## 2017-09-23 DIAGNOSIS — Z7401 Bed confinement status: Secondary | ICD-10-CM | POA: Diagnosis not present

## 2017-09-23 DIAGNOSIS — G40909 Epilepsy, unspecified, not intractable, without status epilepticus: Secondary | ICD-10-CM | POA: Diagnosis not present

## 2017-09-23 DIAGNOSIS — M2550 Pain in unspecified joint: Secondary | ICD-10-CM | POA: Diagnosis not present

## 2017-09-23 DIAGNOSIS — R633 Feeding difficulties: Secondary | ICD-10-CM | POA: Diagnosis not present

## 2017-09-23 DIAGNOSIS — R293 Abnormal posture: Secondary | ICD-10-CM | POA: Diagnosis not present

## 2017-09-23 DIAGNOSIS — Z418 Encounter for other procedures for purposes other than remedying health state: Secondary | ICD-10-CM | POA: Diagnosis not present

## 2017-09-24 DIAGNOSIS — M6281 Muscle weakness (generalized): Secondary | ICD-10-CM | POA: Diagnosis not present

## 2017-09-24 DIAGNOSIS — R633 Feeding difficulties: Secondary | ICD-10-CM | POA: Diagnosis not present

## 2017-09-24 DIAGNOSIS — M24522 Contracture, left elbow: Secondary | ICD-10-CM | POA: Diagnosis not present

## 2017-09-24 DIAGNOSIS — R293 Abnormal posture: Secondary | ICD-10-CM | POA: Diagnosis not present

## 2017-09-24 DIAGNOSIS — N186 End stage renal disease: Secondary | ICD-10-CM | POA: Diagnosis not present

## 2017-09-24 DIAGNOSIS — R278 Other lack of coordination: Secondary | ICD-10-CM | POA: Diagnosis not present

## 2017-09-25 DIAGNOSIS — R278 Other lack of coordination: Secondary | ICD-10-CM | POA: Diagnosis not present

## 2017-09-25 DIAGNOSIS — M6281 Muscle weakness (generalized): Secondary | ICD-10-CM | POA: Diagnosis not present

## 2017-09-25 DIAGNOSIS — Z418 Encounter for other procedures for purposes other than remedying health state: Secondary | ICD-10-CM | POA: Diagnosis not present

## 2017-09-25 DIAGNOSIS — M24522 Contracture, left elbow: Secondary | ICD-10-CM | POA: Diagnosis not present

## 2017-09-25 DIAGNOSIS — N2581 Secondary hyperparathyroidism of renal origin: Secondary | ICD-10-CM | POA: Diagnosis not present

## 2017-09-25 DIAGNOSIS — M2550 Pain in unspecified joint: Secondary | ICD-10-CM | POA: Diagnosis not present

## 2017-09-25 DIAGNOSIS — N186 End stage renal disease: Secondary | ICD-10-CM | POA: Diagnosis not present

## 2017-09-25 DIAGNOSIS — D631 Anemia in chronic kidney disease: Secondary | ICD-10-CM | POA: Diagnosis not present

## 2017-09-25 DIAGNOSIS — R293 Abnormal posture: Secondary | ICD-10-CM | POA: Diagnosis not present

## 2017-09-25 DIAGNOSIS — R633 Feeding difficulties: Secondary | ICD-10-CM | POA: Diagnosis not present

## 2017-09-25 DIAGNOSIS — Z7401 Bed confinement status: Secondary | ICD-10-CM | POA: Diagnosis not present

## 2017-09-26 DIAGNOSIS — N186 End stage renal disease: Secondary | ICD-10-CM | POA: Diagnosis not present

## 2017-09-26 DIAGNOSIS — R293 Abnormal posture: Secondary | ICD-10-CM | POA: Diagnosis not present

## 2017-09-26 DIAGNOSIS — R633 Feeding difficulties: Secondary | ICD-10-CM | POA: Diagnosis not present

## 2017-09-26 DIAGNOSIS — R278 Other lack of coordination: Secondary | ICD-10-CM | POA: Diagnosis not present

## 2017-09-26 DIAGNOSIS — M24522 Contracture, left elbow: Secondary | ICD-10-CM | POA: Diagnosis not present

## 2017-09-26 DIAGNOSIS — R4182 Altered mental status, unspecified: Secondary | ICD-10-CM | POA: Diagnosis not present

## 2017-09-26 DIAGNOSIS — M6281 Muscle weakness (generalized): Secondary | ICD-10-CM | POA: Diagnosis not present

## 2017-09-26 DIAGNOSIS — N179 Acute kidney failure, unspecified: Secondary | ICD-10-CM | POA: Diagnosis not present

## 2017-09-27 DIAGNOSIS — R278 Other lack of coordination: Secondary | ICD-10-CM | POA: Diagnosis not present

## 2017-09-27 DIAGNOSIS — M2550 Pain in unspecified joint: Secondary | ICD-10-CM | POA: Diagnosis not present

## 2017-09-27 DIAGNOSIS — R633 Feeding difficulties: Secondary | ICD-10-CM | POA: Diagnosis not present

## 2017-09-27 DIAGNOSIS — N2581 Secondary hyperparathyroidism of renal origin: Secondary | ICD-10-CM | POA: Diagnosis not present

## 2017-09-27 DIAGNOSIS — Z418 Encounter for other procedures for purposes other than remedying health state: Secondary | ICD-10-CM | POA: Diagnosis not present

## 2017-09-27 DIAGNOSIS — D631 Anemia in chronic kidney disease: Secondary | ICD-10-CM | POA: Diagnosis not present

## 2017-09-27 DIAGNOSIS — M24522 Contracture, left elbow: Secondary | ICD-10-CM | POA: Diagnosis not present

## 2017-09-27 DIAGNOSIS — M6281 Muscle weakness (generalized): Secondary | ICD-10-CM | POA: Diagnosis not present

## 2017-09-27 DIAGNOSIS — Z7401 Bed confinement status: Secondary | ICD-10-CM | POA: Diagnosis not present

## 2017-09-27 DIAGNOSIS — N186 End stage renal disease: Secondary | ICD-10-CM | POA: Diagnosis not present

## 2017-09-27 DIAGNOSIS — R293 Abnormal posture: Secondary | ICD-10-CM | POA: Diagnosis not present

## 2017-09-30 DIAGNOSIS — M24522 Contracture, left elbow: Secondary | ICD-10-CM | POA: Diagnosis not present

## 2017-09-30 DIAGNOSIS — Z7401 Bed confinement status: Secondary | ICD-10-CM | POA: Diagnosis not present

## 2017-09-30 DIAGNOSIS — R278 Other lack of coordination: Secondary | ICD-10-CM | POA: Diagnosis not present

## 2017-09-30 DIAGNOSIS — R293 Abnormal posture: Secondary | ICD-10-CM | POA: Diagnosis not present

## 2017-09-30 DIAGNOSIS — R633 Feeding difficulties: Secondary | ICD-10-CM | POA: Diagnosis not present

## 2017-09-30 DIAGNOSIS — N186 End stage renal disease: Secondary | ICD-10-CM | POA: Diagnosis not present

## 2017-09-30 DIAGNOSIS — N2581 Secondary hyperparathyroidism of renal origin: Secondary | ICD-10-CM | POA: Diagnosis not present

## 2017-09-30 DIAGNOSIS — D631 Anemia in chronic kidney disease: Secondary | ICD-10-CM | POA: Diagnosis not present

## 2017-09-30 DIAGNOSIS — M2550 Pain in unspecified joint: Secondary | ICD-10-CM | POA: Diagnosis not present

## 2017-09-30 DIAGNOSIS — Z418 Encounter for other procedures for purposes other than remedying health state: Secondary | ICD-10-CM | POA: Diagnosis not present

## 2017-09-30 DIAGNOSIS — M6281 Muscle weakness (generalized): Secondary | ICD-10-CM | POA: Diagnosis not present

## 2017-10-01 DIAGNOSIS — R633 Feeding difficulties: Secondary | ICD-10-CM | POA: Diagnosis not present

## 2017-10-01 DIAGNOSIS — R293 Abnormal posture: Secondary | ICD-10-CM | POA: Diagnosis not present

## 2017-10-01 DIAGNOSIS — R278 Other lack of coordination: Secondary | ICD-10-CM | POA: Diagnosis not present

## 2017-10-01 DIAGNOSIS — M24522 Contracture, left elbow: Secondary | ICD-10-CM | POA: Diagnosis not present

## 2017-10-01 DIAGNOSIS — M6281 Muscle weakness (generalized): Secondary | ICD-10-CM | POA: Diagnosis not present

## 2017-10-01 DIAGNOSIS — N186 End stage renal disease: Secondary | ICD-10-CM | POA: Diagnosis not present

## 2017-10-02 DIAGNOSIS — R293 Abnormal posture: Secondary | ICD-10-CM | POA: Diagnosis not present

## 2017-10-02 DIAGNOSIS — M2550 Pain in unspecified joint: Secondary | ICD-10-CM | POA: Diagnosis not present

## 2017-10-02 DIAGNOSIS — N2581 Secondary hyperparathyroidism of renal origin: Secondary | ICD-10-CM | POA: Diagnosis not present

## 2017-10-02 DIAGNOSIS — Z418 Encounter for other procedures for purposes other than remedying health state: Secondary | ICD-10-CM | POA: Diagnosis not present

## 2017-10-02 DIAGNOSIS — D631 Anemia in chronic kidney disease: Secondary | ICD-10-CM | POA: Diagnosis not present

## 2017-10-02 DIAGNOSIS — Z7401 Bed confinement status: Secondary | ICD-10-CM | POA: Diagnosis not present

## 2017-10-02 DIAGNOSIS — N186 End stage renal disease: Secondary | ICD-10-CM | POA: Diagnosis not present

## 2017-10-02 DIAGNOSIS — M24522 Contracture, left elbow: Secondary | ICD-10-CM | POA: Diagnosis not present

## 2017-10-02 DIAGNOSIS — R633 Feeding difficulties: Secondary | ICD-10-CM | POA: Diagnosis not present

## 2017-10-02 DIAGNOSIS — M6281 Muscle weakness (generalized): Secondary | ICD-10-CM | POA: Diagnosis not present

## 2017-10-02 DIAGNOSIS — R278 Other lack of coordination: Secondary | ICD-10-CM | POA: Diagnosis not present

## 2017-10-03 DIAGNOSIS — M6281 Muscle weakness (generalized): Secondary | ICD-10-CM | POA: Diagnosis not present

## 2017-10-03 DIAGNOSIS — R633 Feeding difficulties: Secondary | ICD-10-CM | POA: Diagnosis not present

## 2017-10-03 DIAGNOSIS — R293 Abnormal posture: Secondary | ICD-10-CM | POA: Diagnosis not present

## 2017-10-03 DIAGNOSIS — R278 Other lack of coordination: Secondary | ICD-10-CM | POA: Diagnosis not present

## 2017-10-03 DIAGNOSIS — M24522 Contracture, left elbow: Secondary | ICD-10-CM | POA: Diagnosis not present

## 2017-10-03 DIAGNOSIS — N186 End stage renal disease: Secondary | ICD-10-CM | POA: Diagnosis not present

## 2017-10-04 DIAGNOSIS — R293 Abnormal posture: Secondary | ICD-10-CM | POA: Diagnosis not present

## 2017-10-04 DIAGNOSIS — Z418 Encounter for other procedures for purposes other than remedying health state: Secondary | ICD-10-CM | POA: Diagnosis not present

## 2017-10-04 DIAGNOSIS — Z7401 Bed confinement status: Secondary | ICD-10-CM | POA: Diagnosis not present

## 2017-10-04 DIAGNOSIS — N2581 Secondary hyperparathyroidism of renal origin: Secondary | ICD-10-CM | POA: Diagnosis not present

## 2017-10-04 DIAGNOSIS — M2550 Pain in unspecified joint: Secondary | ICD-10-CM | POA: Diagnosis not present

## 2017-10-04 DIAGNOSIS — R633 Feeding difficulties: Secondary | ICD-10-CM | POA: Diagnosis not present

## 2017-10-04 DIAGNOSIS — R278 Other lack of coordination: Secondary | ICD-10-CM | POA: Diagnosis not present

## 2017-10-04 DIAGNOSIS — N186 End stage renal disease: Secondary | ICD-10-CM | POA: Diagnosis not present

## 2017-10-04 DIAGNOSIS — D631 Anemia in chronic kidney disease: Secondary | ICD-10-CM | POA: Diagnosis not present

## 2017-10-04 DIAGNOSIS — M24522 Contracture, left elbow: Secondary | ICD-10-CM | POA: Diagnosis not present

## 2017-10-04 DIAGNOSIS — M6281 Muscle weakness (generalized): Secondary | ICD-10-CM | POA: Diagnosis not present

## 2017-10-07 DIAGNOSIS — N2581 Secondary hyperparathyroidism of renal origin: Secondary | ICD-10-CM | POA: Diagnosis not present

## 2017-10-07 DIAGNOSIS — M2550 Pain in unspecified joint: Secondary | ICD-10-CM | POA: Diagnosis not present

## 2017-10-07 DIAGNOSIS — R633 Feeding difficulties: Secondary | ICD-10-CM | POA: Diagnosis not present

## 2017-10-07 DIAGNOSIS — N186 End stage renal disease: Secondary | ICD-10-CM | POA: Diagnosis not present

## 2017-10-07 DIAGNOSIS — Z418 Encounter for other procedures for purposes other than remedying health state: Secondary | ICD-10-CM | POA: Diagnosis not present

## 2017-10-07 DIAGNOSIS — R278 Other lack of coordination: Secondary | ICD-10-CM | POA: Diagnosis not present

## 2017-10-07 DIAGNOSIS — R293 Abnormal posture: Secondary | ICD-10-CM | POA: Diagnosis not present

## 2017-10-07 DIAGNOSIS — D631 Anemia in chronic kidney disease: Secondary | ICD-10-CM | POA: Diagnosis not present

## 2017-10-07 DIAGNOSIS — Z7401 Bed confinement status: Secondary | ICD-10-CM | POA: Diagnosis not present

## 2017-10-07 DIAGNOSIS — M6281 Muscle weakness (generalized): Secondary | ICD-10-CM | POA: Diagnosis not present

## 2017-10-07 DIAGNOSIS — M24522 Contracture, left elbow: Secondary | ICD-10-CM | POA: Diagnosis not present

## 2017-10-08 DIAGNOSIS — M6281 Muscle weakness (generalized): Secondary | ICD-10-CM | POA: Diagnosis not present

## 2017-10-08 DIAGNOSIS — R293 Abnormal posture: Secondary | ICD-10-CM | POA: Diagnosis not present

## 2017-10-08 DIAGNOSIS — N186 End stage renal disease: Secondary | ICD-10-CM | POA: Diagnosis not present

## 2017-10-08 DIAGNOSIS — R633 Feeding difficulties: Secondary | ICD-10-CM | POA: Diagnosis not present

## 2017-10-08 DIAGNOSIS — R278 Other lack of coordination: Secondary | ICD-10-CM | POA: Diagnosis not present

## 2017-10-08 DIAGNOSIS — M24522 Contracture, left elbow: Secondary | ICD-10-CM | POA: Diagnosis not present

## 2017-10-09 DIAGNOSIS — N250 Renal osteodystrophy: Secondary | ICD-10-CM | POA: Diagnosis not present

## 2017-10-09 DIAGNOSIS — R633 Feeding difficulties: Secondary | ICD-10-CM | POA: Diagnosis not present

## 2017-10-09 DIAGNOSIS — M24522 Contracture, left elbow: Secondary | ICD-10-CM | POA: Diagnosis not present

## 2017-10-09 DIAGNOSIS — R7881 Bacteremia: Secondary | ICD-10-CM | POA: Diagnosis not present

## 2017-10-09 DIAGNOSIS — D649 Anemia, unspecified: Secondary | ICD-10-CM | POA: Diagnosis not present

## 2017-10-09 DIAGNOSIS — I482 Chronic atrial fibrillation: Secondary | ICD-10-CM | POA: Diagnosis not present

## 2017-10-09 DIAGNOSIS — N2581 Secondary hyperparathyroidism of renal origin: Secondary | ICD-10-CM | POA: Diagnosis not present

## 2017-10-09 DIAGNOSIS — M2550 Pain in unspecified joint: Secondary | ICD-10-CM | POA: Diagnosis not present

## 2017-10-09 DIAGNOSIS — I69351 Hemiplegia and hemiparesis following cerebral infarction affecting right dominant side: Secondary | ICD-10-CM | POA: Diagnosis not present

## 2017-10-09 DIAGNOSIS — Z7401 Bed confinement status: Secondary | ICD-10-CM | POA: Diagnosis not present

## 2017-10-09 DIAGNOSIS — E1165 Type 2 diabetes mellitus with hyperglycemia: Secondary | ICD-10-CM | POA: Diagnosis not present

## 2017-10-09 DIAGNOSIS — R278 Other lack of coordination: Secondary | ICD-10-CM | POA: Diagnosis not present

## 2017-10-09 DIAGNOSIS — R293 Abnormal posture: Secondary | ICD-10-CM | POA: Diagnosis not present

## 2017-10-09 DIAGNOSIS — M109 Gout, unspecified: Secondary | ICD-10-CM | POA: Diagnosis not present

## 2017-10-09 DIAGNOSIS — E119 Type 2 diabetes mellitus without complications: Secondary | ICD-10-CM | POA: Diagnosis not present

## 2017-10-09 DIAGNOSIS — Z418 Encounter for other procedures for purposes other than remedying health state: Secondary | ICD-10-CM | POA: Diagnosis not present

## 2017-10-09 DIAGNOSIS — I10 Essential (primary) hypertension: Secondary | ICD-10-CM | POA: Diagnosis not present

## 2017-10-09 DIAGNOSIS — M6281 Muscle weakness (generalized): Secondary | ICD-10-CM | POA: Diagnosis not present

## 2017-10-09 DIAGNOSIS — N186 End stage renal disease: Secondary | ICD-10-CM | POA: Diagnosis not present

## 2017-10-09 DIAGNOSIS — E1142 Type 2 diabetes mellitus with diabetic polyneuropathy: Secondary | ICD-10-CM | POA: Diagnosis not present

## 2017-10-09 DIAGNOSIS — D631 Anemia in chronic kidney disease: Secondary | ICD-10-CM | POA: Diagnosis not present

## 2017-10-10 DIAGNOSIS — R293 Abnormal posture: Secondary | ICD-10-CM | POA: Diagnosis not present

## 2017-10-10 DIAGNOSIS — M6281 Muscle weakness (generalized): Secondary | ICD-10-CM | POA: Diagnosis not present

## 2017-10-10 DIAGNOSIS — R633 Feeding difficulties: Secondary | ICD-10-CM | POA: Diagnosis not present

## 2017-10-10 DIAGNOSIS — R278 Other lack of coordination: Secondary | ICD-10-CM | POA: Diagnosis not present

## 2017-10-10 DIAGNOSIS — N186 End stage renal disease: Secondary | ICD-10-CM | POA: Diagnosis not present

## 2017-10-10 DIAGNOSIS — M24522 Contracture, left elbow: Secondary | ICD-10-CM | POA: Diagnosis not present

## 2017-10-11 DIAGNOSIS — Z7401 Bed confinement status: Secondary | ICD-10-CM | POA: Diagnosis not present

## 2017-10-11 DIAGNOSIS — D631 Anemia in chronic kidney disease: Secondary | ICD-10-CM | POA: Diagnosis not present

## 2017-10-11 DIAGNOSIS — R7881 Bacteremia: Secondary | ICD-10-CM | POA: Diagnosis not present

## 2017-10-11 DIAGNOSIS — E7800 Pure hypercholesterolemia, unspecified: Secondary | ICD-10-CM | POA: Diagnosis not present

## 2017-10-11 DIAGNOSIS — Z418 Encounter for other procedures for purposes other than remedying health state: Secondary | ICD-10-CM | POA: Diagnosis not present

## 2017-10-11 DIAGNOSIS — N186 End stage renal disease: Secondary | ICD-10-CM | POA: Diagnosis not present

## 2017-10-11 DIAGNOSIS — N2581 Secondary hyperparathyroidism of renal origin: Secondary | ICD-10-CM | POA: Diagnosis not present

## 2017-10-11 DIAGNOSIS — M2550 Pain in unspecified joint: Secondary | ICD-10-CM | POA: Diagnosis not present

## 2017-10-14 DIAGNOSIS — R278 Other lack of coordination: Secondary | ICD-10-CM | POA: Diagnosis not present

## 2017-10-14 DIAGNOSIS — R7881 Bacteremia: Secondary | ICD-10-CM | POA: Diagnosis not present

## 2017-10-14 DIAGNOSIS — M24522 Contracture, left elbow: Secondary | ICD-10-CM | POA: Diagnosis not present

## 2017-10-14 DIAGNOSIS — N2581 Secondary hyperparathyroidism of renal origin: Secondary | ICD-10-CM | POA: Diagnosis not present

## 2017-10-14 DIAGNOSIS — R293 Abnormal posture: Secondary | ICD-10-CM | POA: Diagnosis not present

## 2017-10-14 DIAGNOSIS — R633 Feeding difficulties: Secondary | ICD-10-CM | POA: Diagnosis not present

## 2017-10-14 DIAGNOSIS — M6281 Muscle weakness (generalized): Secondary | ICD-10-CM | POA: Diagnosis not present

## 2017-10-14 DIAGNOSIS — N186 End stage renal disease: Secondary | ICD-10-CM | POA: Diagnosis not present

## 2017-10-14 DIAGNOSIS — Z418 Encounter for other procedures for purposes other than remedying health state: Secondary | ICD-10-CM | POA: Diagnosis not present

## 2017-10-14 DIAGNOSIS — D631 Anemia in chronic kidney disease: Secondary | ICD-10-CM | POA: Diagnosis not present

## 2017-10-14 DIAGNOSIS — M2550 Pain in unspecified joint: Secondary | ICD-10-CM | POA: Diagnosis not present

## 2017-10-14 DIAGNOSIS — Z7401 Bed confinement status: Secondary | ICD-10-CM | POA: Diagnosis not present

## 2017-10-15 DIAGNOSIS — M6281 Muscle weakness (generalized): Secondary | ICD-10-CM | POA: Diagnosis not present

## 2017-10-15 DIAGNOSIS — R633 Feeding difficulties: Secondary | ICD-10-CM | POA: Diagnosis not present

## 2017-10-15 DIAGNOSIS — R278 Other lack of coordination: Secondary | ICD-10-CM | POA: Diagnosis not present

## 2017-10-15 DIAGNOSIS — M24522 Contracture, left elbow: Secondary | ICD-10-CM | POA: Diagnosis not present

## 2017-10-15 DIAGNOSIS — N186 End stage renal disease: Secondary | ICD-10-CM | POA: Diagnosis not present

## 2017-10-15 DIAGNOSIS — R293 Abnormal posture: Secondary | ICD-10-CM | POA: Diagnosis not present

## 2017-10-16 DIAGNOSIS — N186 End stage renal disease: Secondary | ICD-10-CM | POA: Diagnosis not present

## 2017-10-16 DIAGNOSIS — N2581 Secondary hyperparathyroidism of renal origin: Secondary | ICD-10-CM | POA: Diagnosis not present

## 2017-10-16 DIAGNOSIS — M6281 Muscle weakness (generalized): Secondary | ICD-10-CM | POA: Diagnosis not present

## 2017-10-16 DIAGNOSIS — R7881 Bacteremia: Secondary | ICD-10-CM | POA: Diagnosis not present

## 2017-10-16 DIAGNOSIS — M24522 Contracture, left elbow: Secondary | ICD-10-CM | POA: Diagnosis not present

## 2017-10-16 DIAGNOSIS — R278 Other lack of coordination: Secondary | ICD-10-CM | POA: Diagnosis not present

## 2017-10-16 DIAGNOSIS — D631 Anemia in chronic kidney disease: Secondary | ICD-10-CM | POA: Diagnosis not present

## 2017-10-16 DIAGNOSIS — Z418 Encounter for other procedures for purposes other than remedying health state: Secondary | ICD-10-CM | POA: Diagnosis not present

## 2017-10-16 DIAGNOSIS — Z7401 Bed confinement status: Secondary | ICD-10-CM | POA: Diagnosis not present

## 2017-10-16 DIAGNOSIS — R293 Abnormal posture: Secondary | ICD-10-CM | POA: Diagnosis not present

## 2017-10-16 DIAGNOSIS — M2550 Pain in unspecified joint: Secondary | ICD-10-CM | POA: Diagnosis not present

## 2017-10-16 DIAGNOSIS — R633 Feeding difficulties: Secondary | ICD-10-CM | POA: Diagnosis not present

## 2017-10-17 DIAGNOSIS — M6281 Muscle weakness (generalized): Secondary | ICD-10-CM | POA: Diagnosis not present

## 2017-10-17 DIAGNOSIS — R278 Other lack of coordination: Secondary | ICD-10-CM | POA: Diagnosis not present

## 2017-10-17 DIAGNOSIS — R633 Feeding difficulties: Secondary | ICD-10-CM | POA: Diagnosis not present

## 2017-10-17 DIAGNOSIS — R293 Abnormal posture: Secondary | ICD-10-CM | POA: Diagnosis not present

## 2017-10-17 DIAGNOSIS — N186 End stage renal disease: Secondary | ICD-10-CM | POA: Diagnosis not present

## 2017-10-17 DIAGNOSIS — M24522 Contracture, left elbow: Secondary | ICD-10-CM | POA: Diagnosis not present

## 2017-10-18 DIAGNOSIS — N186 End stage renal disease: Secondary | ICD-10-CM | POA: Diagnosis not present

## 2017-10-18 DIAGNOSIS — M2550 Pain in unspecified joint: Secondary | ICD-10-CM | POA: Diagnosis not present

## 2017-10-18 DIAGNOSIS — Z418 Encounter for other procedures for purposes other than remedying health state: Secondary | ICD-10-CM | POA: Diagnosis not present

## 2017-10-18 DIAGNOSIS — R633 Feeding difficulties: Secondary | ICD-10-CM | POA: Diagnosis not present

## 2017-10-18 DIAGNOSIS — R7881 Bacteremia: Secondary | ICD-10-CM | POA: Diagnosis not present

## 2017-10-18 DIAGNOSIS — R278 Other lack of coordination: Secondary | ICD-10-CM | POA: Diagnosis not present

## 2017-10-18 DIAGNOSIS — R293 Abnormal posture: Secondary | ICD-10-CM | POA: Diagnosis not present

## 2017-10-18 DIAGNOSIS — D631 Anemia in chronic kidney disease: Secondary | ICD-10-CM | POA: Diagnosis not present

## 2017-10-18 DIAGNOSIS — M24522 Contracture, left elbow: Secondary | ICD-10-CM | POA: Diagnosis not present

## 2017-10-18 DIAGNOSIS — M6281 Muscle weakness (generalized): Secondary | ICD-10-CM | POA: Diagnosis not present

## 2017-10-18 DIAGNOSIS — N2581 Secondary hyperparathyroidism of renal origin: Secondary | ICD-10-CM | POA: Diagnosis not present

## 2017-10-18 DIAGNOSIS — Z7401 Bed confinement status: Secondary | ICD-10-CM | POA: Diagnosis not present

## 2017-10-21 DIAGNOSIS — D631 Anemia in chronic kidney disease: Secondary | ICD-10-CM | POA: Diagnosis not present

## 2017-10-21 DIAGNOSIS — Z7401 Bed confinement status: Secondary | ICD-10-CM | POA: Diagnosis not present

## 2017-10-21 DIAGNOSIS — M2550 Pain in unspecified joint: Secondary | ICD-10-CM | POA: Diagnosis not present

## 2017-10-21 DIAGNOSIS — R293 Abnormal posture: Secondary | ICD-10-CM | POA: Diagnosis not present

## 2017-10-21 DIAGNOSIS — R278 Other lack of coordination: Secondary | ICD-10-CM | POA: Diagnosis not present

## 2017-10-21 DIAGNOSIS — N186 End stage renal disease: Secondary | ICD-10-CM | POA: Diagnosis not present

## 2017-10-21 DIAGNOSIS — R633 Feeding difficulties: Secondary | ICD-10-CM | POA: Diagnosis not present

## 2017-10-21 DIAGNOSIS — M24522 Contracture, left elbow: Secondary | ICD-10-CM | POA: Diagnosis not present

## 2017-10-21 DIAGNOSIS — Z418 Encounter for other procedures for purposes other than remedying health state: Secondary | ICD-10-CM | POA: Diagnosis not present

## 2017-10-21 DIAGNOSIS — N2581 Secondary hyperparathyroidism of renal origin: Secondary | ICD-10-CM | POA: Diagnosis not present

## 2017-10-21 DIAGNOSIS — M6281 Muscle weakness (generalized): Secondary | ICD-10-CM | POA: Diagnosis not present

## 2017-10-21 DIAGNOSIS — R7881 Bacteremia: Secondary | ICD-10-CM | POA: Diagnosis not present

## 2017-10-23 DIAGNOSIS — N186 End stage renal disease: Secondary | ICD-10-CM | POA: Diagnosis not present

## 2017-10-23 DIAGNOSIS — Z418 Encounter for other procedures for purposes other than remedying health state: Secondary | ICD-10-CM | POA: Diagnosis not present

## 2017-10-23 DIAGNOSIS — Z7401 Bed confinement status: Secondary | ICD-10-CM | POA: Diagnosis not present

## 2017-10-23 DIAGNOSIS — N2581 Secondary hyperparathyroidism of renal origin: Secondary | ICD-10-CM | POA: Diagnosis not present

## 2017-10-23 DIAGNOSIS — M2550 Pain in unspecified joint: Secondary | ICD-10-CM | POA: Diagnosis not present

## 2017-10-23 DIAGNOSIS — D631 Anemia in chronic kidney disease: Secondary | ICD-10-CM | POA: Diagnosis not present

## 2017-10-23 DIAGNOSIS — R7881 Bacteremia: Secondary | ICD-10-CM | POA: Diagnosis not present

## 2017-10-24 DIAGNOSIS — M6281 Muscle weakness (generalized): Secondary | ICD-10-CM | POA: Diagnosis not present

## 2017-10-24 DIAGNOSIS — R633 Feeding difficulties: Secondary | ICD-10-CM | POA: Diagnosis not present

## 2017-10-24 DIAGNOSIS — R293 Abnormal posture: Secondary | ICD-10-CM | POA: Diagnosis not present

## 2017-10-24 DIAGNOSIS — M24522 Contracture, left elbow: Secondary | ICD-10-CM | POA: Diagnosis not present

## 2017-10-24 DIAGNOSIS — R278 Other lack of coordination: Secondary | ICD-10-CM | POA: Diagnosis not present

## 2017-10-24 DIAGNOSIS — N186 End stage renal disease: Secondary | ICD-10-CM | POA: Diagnosis not present

## 2017-10-25 DIAGNOSIS — D649 Anemia, unspecified: Secondary | ICD-10-CM | POA: Diagnosis not present

## 2017-10-25 DIAGNOSIS — G40909 Epilepsy, unspecified, not intractable, without status epilepticus: Secondary | ICD-10-CM | POA: Diagnosis not present

## 2017-10-25 DIAGNOSIS — M6281 Muscle weakness (generalized): Secondary | ICD-10-CM | POA: Diagnosis not present

## 2017-10-25 DIAGNOSIS — D631 Anemia in chronic kidney disease: Secondary | ICD-10-CM | POA: Diagnosis not present

## 2017-10-25 DIAGNOSIS — M24522 Contracture, left elbow: Secondary | ICD-10-CM | POA: Diagnosis not present

## 2017-10-25 DIAGNOSIS — R7881 Bacteremia: Secondary | ICD-10-CM | POA: Diagnosis not present

## 2017-10-25 DIAGNOSIS — N2581 Secondary hyperparathyroidism of renal origin: Secondary | ICD-10-CM | POA: Diagnosis not present

## 2017-10-25 DIAGNOSIS — R293 Abnormal posture: Secondary | ICD-10-CM | POA: Diagnosis not present

## 2017-10-25 DIAGNOSIS — R278 Other lack of coordination: Secondary | ICD-10-CM | POA: Diagnosis not present

## 2017-10-25 DIAGNOSIS — Z7401 Bed confinement status: Secondary | ICD-10-CM | POA: Diagnosis not present

## 2017-10-25 DIAGNOSIS — M2550 Pain in unspecified joint: Secondary | ICD-10-CM | POA: Diagnosis not present

## 2017-10-25 DIAGNOSIS — N186 End stage renal disease: Secondary | ICD-10-CM | POA: Diagnosis not present

## 2017-10-25 DIAGNOSIS — R633 Feeding difficulties: Secondary | ICD-10-CM | POA: Diagnosis not present

## 2017-10-25 DIAGNOSIS — M545 Low back pain: Secondary | ICD-10-CM | POA: Diagnosis not present

## 2017-10-25 DIAGNOSIS — Z418 Encounter for other procedures for purposes other than remedying health state: Secondary | ICD-10-CM | POA: Diagnosis not present

## 2017-10-28 DIAGNOSIS — N186 End stage renal disease: Secondary | ICD-10-CM | POA: Diagnosis not present

## 2017-10-28 DIAGNOSIS — Z418 Encounter for other procedures for purposes other than remedying health state: Secondary | ICD-10-CM | POA: Diagnosis not present

## 2017-10-28 DIAGNOSIS — M2550 Pain in unspecified joint: Secondary | ICD-10-CM | POA: Diagnosis not present

## 2017-10-28 DIAGNOSIS — R278 Other lack of coordination: Secondary | ICD-10-CM | POA: Diagnosis not present

## 2017-10-28 DIAGNOSIS — R293 Abnormal posture: Secondary | ICD-10-CM | POA: Diagnosis not present

## 2017-10-28 DIAGNOSIS — R633 Feeding difficulties: Secondary | ICD-10-CM | POA: Diagnosis not present

## 2017-10-28 DIAGNOSIS — M24522 Contracture, left elbow: Secondary | ICD-10-CM | POA: Diagnosis not present

## 2017-10-28 DIAGNOSIS — M6281 Muscle weakness (generalized): Secondary | ICD-10-CM | POA: Diagnosis not present

## 2017-10-28 DIAGNOSIS — Z7401 Bed confinement status: Secondary | ICD-10-CM | POA: Diagnosis not present

## 2017-10-28 DIAGNOSIS — D631 Anemia in chronic kidney disease: Secondary | ICD-10-CM | POA: Diagnosis not present

## 2017-10-28 DIAGNOSIS — R7881 Bacteremia: Secondary | ICD-10-CM | POA: Diagnosis not present

## 2017-10-28 DIAGNOSIS — N2581 Secondary hyperparathyroidism of renal origin: Secondary | ICD-10-CM | POA: Diagnosis not present

## 2017-10-29 DIAGNOSIS — R633 Feeding difficulties: Secondary | ICD-10-CM | POA: Diagnosis not present

## 2017-10-29 DIAGNOSIS — R278 Other lack of coordination: Secondary | ICD-10-CM | POA: Diagnosis not present

## 2017-10-29 DIAGNOSIS — R293 Abnormal posture: Secondary | ICD-10-CM | POA: Diagnosis not present

## 2017-10-29 DIAGNOSIS — M6281 Muscle weakness (generalized): Secondary | ICD-10-CM | POA: Diagnosis not present

## 2017-10-29 DIAGNOSIS — N186 End stage renal disease: Secondary | ICD-10-CM | POA: Diagnosis not present

## 2017-10-29 DIAGNOSIS — M24522 Contracture, left elbow: Secondary | ICD-10-CM | POA: Diagnosis not present

## 2017-10-30 DIAGNOSIS — Z7401 Bed confinement status: Secondary | ICD-10-CM | POA: Diagnosis not present

## 2017-10-30 DIAGNOSIS — R7881 Bacteremia: Secondary | ICD-10-CM | POA: Diagnosis not present

## 2017-10-30 DIAGNOSIS — Z418 Encounter for other procedures for purposes other than remedying health state: Secondary | ICD-10-CM | POA: Diagnosis not present

## 2017-10-30 DIAGNOSIS — N2581 Secondary hyperparathyroidism of renal origin: Secondary | ICD-10-CM | POA: Diagnosis not present

## 2017-10-30 DIAGNOSIS — M2550 Pain in unspecified joint: Secondary | ICD-10-CM | POA: Diagnosis not present

## 2017-10-30 DIAGNOSIS — D631 Anemia in chronic kidney disease: Secondary | ICD-10-CM | POA: Diagnosis not present

## 2017-10-30 DIAGNOSIS — N186 End stage renal disease: Secondary | ICD-10-CM | POA: Diagnosis not present

## 2017-10-31 DIAGNOSIS — M24522 Contracture, left elbow: Secondary | ICD-10-CM | POA: Diagnosis not present

## 2017-10-31 DIAGNOSIS — R293 Abnormal posture: Secondary | ICD-10-CM | POA: Diagnosis not present

## 2017-10-31 DIAGNOSIS — R278 Other lack of coordination: Secondary | ICD-10-CM | POA: Diagnosis not present

## 2017-10-31 DIAGNOSIS — M6281 Muscle weakness (generalized): Secondary | ICD-10-CM | POA: Diagnosis not present

## 2017-10-31 DIAGNOSIS — R633 Feeding difficulties: Secondary | ICD-10-CM | POA: Diagnosis not present

## 2017-10-31 DIAGNOSIS — N186 End stage renal disease: Secondary | ICD-10-CM | POA: Diagnosis not present

## 2017-11-01 DIAGNOSIS — M2550 Pain in unspecified joint: Secondary | ICD-10-CM | POA: Diagnosis not present

## 2017-11-01 DIAGNOSIS — N186 End stage renal disease: Secondary | ICD-10-CM | POA: Diagnosis not present

## 2017-11-01 DIAGNOSIS — Z418 Encounter for other procedures for purposes other than remedying health state: Secondary | ICD-10-CM | POA: Diagnosis not present

## 2017-11-01 DIAGNOSIS — D631 Anemia in chronic kidney disease: Secondary | ICD-10-CM | POA: Diagnosis not present

## 2017-11-01 DIAGNOSIS — Z7401 Bed confinement status: Secondary | ICD-10-CM | POA: Diagnosis not present

## 2017-11-01 DIAGNOSIS — R7881 Bacteremia: Secondary | ICD-10-CM | POA: Diagnosis not present

## 2017-11-01 DIAGNOSIS — N2581 Secondary hyperparathyroidism of renal origin: Secondary | ICD-10-CM | POA: Diagnosis not present

## 2017-11-04 DIAGNOSIS — N2581 Secondary hyperparathyroidism of renal origin: Secondary | ICD-10-CM | POA: Diagnosis not present

## 2017-11-04 DIAGNOSIS — Z418 Encounter for other procedures for purposes other than remedying health state: Secondary | ICD-10-CM | POA: Diagnosis not present

## 2017-11-04 DIAGNOSIS — R633 Feeding difficulties: Secondary | ICD-10-CM | POA: Diagnosis not present

## 2017-11-04 DIAGNOSIS — M6281 Muscle weakness (generalized): Secondary | ICD-10-CM | POA: Diagnosis not present

## 2017-11-04 DIAGNOSIS — D631 Anemia in chronic kidney disease: Secondary | ICD-10-CM | POA: Diagnosis not present

## 2017-11-04 DIAGNOSIS — M24522 Contracture, left elbow: Secondary | ICD-10-CM | POA: Diagnosis not present

## 2017-11-04 DIAGNOSIS — R293 Abnormal posture: Secondary | ICD-10-CM | POA: Diagnosis not present

## 2017-11-04 DIAGNOSIS — M2550 Pain in unspecified joint: Secondary | ICD-10-CM | POA: Diagnosis not present

## 2017-11-04 DIAGNOSIS — Z7401 Bed confinement status: Secondary | ICD-10-CM | POA: Diagnosis not present

## 2017-11-04 DIAGNOSIS — R278 Other lack of coordination: Secondary | ICD-10-CM | POA: Diagnosis not present

## 2017-11-04 DIAGNOSIS — R7881 Bacteremia: Secondary | ICD-10-CM | POA: Diagnosis not present

## 2017-11-04 DIAGNOSIS — N186 End stage renal disease: Secondary | ICD-10-CM | POA: Diagnosis not present

## 2017-11-05 DIAGNOSIS — M6281 Muscle weakness (generalized): Secondary | ICD-10-CM | POA: Diagnosis not present

## 2017-11-05 DIAGNOSIS — M24522 Contracture, left elbow: Secondary | ICD-10-CM | POA: Diagnosis not present

## 2017-11-05 DIAGNOSIS — R278 Other lack of coordination: Secondary | ICD-10-CM | POA: Diagnosis not present

## 2017-11-05 DIAGNOSIS — R633 Feeding difficulties: Secondary | ICD-10-CM | POA: Diagnosis not present

## 2017-11-05 DIAGNOSIS — N186 End stage renal disease: Secondary | ICD-10-CM | POA: Diagnosis not present

## 2017-11-05 DIAGNOSIS — R293 Abnormal posture: Secondary | ICD-10-CM | POA: Diagnosis not present

## 2017-11-06 DIAGNOSIS — N186 End stage renal disease: Secondary | ICD-10-CM | POA: Diagnosis not present

## 2017-11-06 DIAGNOSIS — D631 Anemia in chronic kidney disease: Secondary | ICD-10-CM | POA: Diagnosis not present

## 2017-11-06 DIAGNOSIS — R7881 Bacteremia: Secondary | ICD-10-CM | POA: Diagnosis not present

## 2017-11-06 DIAGNOSIS — M2550 Pain in unspecified joint: Secondary | ICD-10-CM | POA: Diagnosis not present

## 2017-11-06 DIAGNOSIS — Z418 Encounter for other procedures for purposes other than remedying health state: Secondary | ICD-10-CM | POA: Diagnosis not present

## 2017-11-06 DIAGNOSIS — N2581 Secondary hyperparathyroidism of renal origin: Secondary | ICD-10-CM | POA: Diagnosis not present

## 2017-11-06 DIAGNOSIS — Z7401 Bed confinement status: Secondary | ICD-10-CM | POA: Diagnosis not present

## 2017-11-07 DIAGNOSIS — R633 Feeding difficulties: Secondary | ICD-10-CM | POA: Diagnosis not present

## 2017-11-07 DIAGNOSIS — M24522 Contracture, left elbow: Secondary | ICD-10-CM | POA: Diagnosis not present

## 2017-11-07 DIAGNOSIS — N186 End stage renal disease: Secondary | ICD-10-CM | POA: Diagnosis not present

## 2017-11-07 DIAGNOSIS — M6281 Muscle weakness (generalized): Secondary | ICD-10-CM | POA: Diagnosis not present

## 2017-11-07 DIAGNOSIS — R278 Other lack of coordination: Secondary | ICD-10-CM | POA: Diagnosis not present

## 2017-11-07 DIAGNOSIS — R293 Abnormal posture: Secondary | ICD-10-CM | POA: Diagnosis not present

## 2017-11-08 DIAGNOSIS — R7881 Bacteremia: Secondary | ICD-10-CM | POA: Diagnosis not present

## 2017-11-08 DIAGNOSIS — M2550 Pain in unspecified joint: Secondary | ICD-10-CM | POA: Diagnosis not present

## 2017-11-08 DIAGNOSIS — N2581 Secondary hyperparathyroidism of renal origin: Secondary | ICD-10-CM | POA: Diagnosis not present

## 2017-11-08 DIAGNOSIS — N186 End stage renal disease: Secondary | ICD-10-CM | POA: Diagnosis not present

## 2017-11-08 DIAGNOSIS — Z7401 Bed confinement status: Secondary | ICD-10-CM | POA: Diagnosis not present

## 2017-11-08 DIAGNOSIS — D631 Anemia in chronic kidney disease: Secondary | ICD-10-CM | POA: Diagnosis not present

## 2017-11-08 DIAGNOSIS — Z418 Encounter for other procedures for purposes other than remedying health state: Secondary | ICD-10-CM | POA: Diagnosis not present

## 2017-11-09 DIAGNOSIS — I10 Essential (primary) hypertension: Secondary | ICD-10-CM | POA: Diagnosis not present

## 2017-11-09 DIAGNOSIS — D631 Anemia in chronic kidney disease: Secondary | ICD-10-CM | POA: Diagnosis not present

## 2017-11-09 DIAGNOSIS — N186 End stage renal disease: Secondary | ICD-10-CM | POA: Diagnosis not present

## 2017-11-11 DIAGNOSIS — Z9981 Dependence on supplemental oxygen: Secondary | ICD-10-CM | POA: Diagnosis not present

## 2017-11-11 DIAGNOSIS — L89892 Pressure ulcer of other site, stage 2: Secondary | ICD-10-CM | POA: Diagnosis not present

## 2017-11-11 DIAGNOSIS — I482 Chronic atrial fibrillation: Secondary | ICD-10-CM | POA: Diagnosis not present

## 2017-11-11 DIAGNOSIS — Z4901 Encounter for fitting and adjustment of extracorporeal dialysis catheter: Secondary | ICD-10-CM | POA: Diagnosis not present

## 2017-11-11 DIAGNOSIS — R0602 Shortness of breath: Secondary | ICD-10-CM | POA: Diagnosis not present

## 2017-11-11 DIAGNOSIS — E785 Hyperlipidemia, unspecified: Secondary | ICD-10-CM | POA: Diagnosis not present

## 2017-11-11 DIAGNOSIS — I132 Hypertensive heart and chronic kidney disease with heart failure and with stage 5 chronic kidney disease, or end stage renal disease: Secondary | ICD-10-CM | POA: Diagnosis not present

## 2017-11-11 DIAGNOSIS — Z794 Long term (current) use of insulin: Secondary | ICD-10-CM | POA: Diagnosis not present

## 2017-11-11 DIAGNOSIS — A4181 Sepsis due to Enterococcus: Secondary | ICD-10-CM | POA: Diagnosis present

## 2017-11-11 DIAGNOSIS — B9562 Methicillin resistant Staphylococcus aureus infection as the cause of diseases classified elsewhere: Secondary | ICD-10-CM | POA: Diagnosis not present

## 2017-11-11 DIAGNOSIS — T827XXD Infection and inflammatory reaction due to other cardiac and vascular devices, implants and grafts, subsequent encounter: Secondary | ICD-10-CM | POA: Diagnosis not present

## 2017-11-11 DIAGNOSIS — E559 Vitamin D deficiency, unspecified: Secondary | ICD-10-CM | POA: Diagnosis not present

## 2017-11-11 DIAGNOSIS — N186 End stage renal disease: Secondary | ICD-10-CM | POA: Diagnosis not present

## 2017-11-11 DIAGNOSIS — H548 Legal blindness, as defined in USA: Secondary | ICD-10-CM | POA: Diagnosis present

## 2017-11-11 DIAGNOSIS — I081 Rheumatic disorders of both mitral and tricuspid valves: Secondary | ICD-10-CM | POA: Diagnosis not present

## 2017-11-11 DIAGNOSIS — Z89511 Acquired absence of right leg below knee: Secondary | ICD-10-CM | POA: Diagnosis not present

## 2017-11-11 DIAGNOSIS — Z8249 Family history of ischemic heart disease and other diseases of the circulatory system: Secondary | ICD-10-CM | POA: Diagnosis not present

## 2017-11-11 DIAGNOSIS — K5900 Constipation, unspecified: Secondary | ICD-10-CM | POA: Diagnosis not present

## 2017-11-11 DIAGNOSIS — M24542 Contracture, left hand: Secondary | ICD-10-CM | POA: Diagnosis not present

## 2017-11-11 DIAGNOSIS — Z8673 Personal history of transient ischemic attack (TIA), and cerebral infarction without residual deficits: Secondary | ICD-10-CM | POA: Diagnosis not present

## 2017-11-11 DIAGNOSIS — E1122 Type 2 diabetes mellitus with diabetic chronic kidney disease: Secondary | ICD-10-CM | POA: Diagnosis present

## 2017-11-11 DIAGNOSIS — I639 Cerebral infarction, unspecified: Secondary | ICD-10-CM | POA: Diagnosis not present

## 2017-11-11 DIAGNOSIS — T827XXS Infection and inflammatory reaction due to other cardiac and vascular devices, implants and grafts, sequela: Secondary | ICD-10-CM | POA: Diagnosis not present

## 2017-11-11 DIAGNOSIS — M62422 Contracture of muscle, left upper arm: Secondary | ICD-10-CM | POA: Diagnosis present

## 2017-11-11 DIAGNOSIS — Z6841 Body Mass Index (BMI) 40.0 and over, adult: Secondary | ICD-10-CM | POA: Diagnosis not present

## 2017-11-11 DIAGNOSIS — Z7401 Bed confinement status: Secondary | ICD-10-CM | POA: Diagnosis not present

## 2017-11-11 DIAGNOSIS — J181 Lobar pneumonia, unspecified organism: Secondary | ICD-10-CM | POA: Diagnosis not present

## 2017-11-11 DIAGNOSIS — I120 Hypertensive chronic kidney disease with stage 5 chronic kidney disease or end stage renal disease: Secondary | ICD-10-CM | POA: Diagnosis not present

## 2017-11-11 DIAGNOSIS — M24522 Contracture, left elbow: Secondary | ICD-10-CM | POA: Diagnosis not present

## 2017-11-11 DIAGNOSIS — Z89611 Acquired absence of right leg above knee: Secondary | ICD-10-CM | POA: Diagnosis not present

## 2017-11-11 DIAGNOSIS — I10 Essential (primary) hypertension: Secondary | ICD-10-CM | POA: Diagnosis not present

## 2017-11-11 DIAGNOSIS — M6281 Muscle weakness (generalized): Secondary | ICD-10-CM | POA: Diagnosis not present

## 2017-11-11 DIAGNOSIS — I69954 Hemiplegia and hemiparesis following unspecified cerebrovascular disease affecting left non-dominant side: Secondary | ICD-10-CM | POA: Diagnosis not present

## 2017-11-11 DIAGNOSIS — L89222 Pressure ulcer of left hip, stage 2: Secondary | ICD-10-CM | POA: Diagnosis not present

## 2017-11-11 DIAGNOSIS — R293 Abnormal posture: Secondary | ICD-10-CM | POA: Diagnosis not present

## 2017-11-11 DIAGNOSIS — I959 Hypotension, unspecified: Secondary | ICD-10-CM | POA: Diagnosis not present

## 2017-11-11 DIAGNOSIS — D631 Anemia in chronic kidney disease: Secondary | ICD-10-CM | POA: Diagnosis not present

## 2017-11-11 DIAGNOSIS — T80211D Bloodstream infection due to central venous catheter, subsequent encounter: Secondary | ICD-10-CM | POA: Diagnosis not present

## 2017-11-11 DIAGNOSIS — I129 Hypertensive chronic kidney disease with stage 1 through stage 4 chronic kidney disease, or unspecified chronic kidney disease: Secondary | ICD-10-CM | POA: Diagnosis not present

## 2017-11-11 DIAGNOSIS — I69354 Hemiplegia and hemiparesis following cerebral infarction affecting left non-dominant side: Secondary | ICD-10-CM | POA: Diagnosis not present

## 2017-11-11 DIAGNOSIS — I429 Cardiomyopathy, unspecified: Secondary | ICD-10-CM | POA: Diagnosis not present

## 2017-11-11 DIAGNOSIS — I43 Cardiomyopathy in diseases classified elsewhere: Secondary | ICD-10-CM | POA: Diagnosis present

## 2017-11-11 DIAGNOSIS — R918 Other nonspecific abnormal finding of lung field: Secondary | ICD-10-CM | POA: Diagnosis not present

## 2017-11-11 DIAGNOSIS — J189 Pneumonia, unspecified organism: Secondary | ICD-10-CM | POA: Diagnosis not present

## 2017-11-11 DIAGNOSIS — Z89512 Acquired absence of left leg below knee: Secondary | ICD-10-CM | POA: Diagnosis not present

## 2017-11-11 DIAGNOSIS — E213 Hyperparathyroidism, unspecified: Secondary | ICD-10-CM | POA: Diagnosis present

## 2017-11-11 DIAGNOSIS — B952 Enterococcus as the cause of diseases classified elsewhere: Secondary | ICD-10-CM | POA: Diagnosis not present

## 2017-11-11 DIAGNOSIS — E1169 Type 2 diabetes mellitus with other specified complication: Secondary | ICD-10-CM | POA: Diagnosis not present

## 2017-11-11 DIAGNOSIS — I517 Cardiomegaly: Secondary | ICD-10-CM | POA: Diagnosis not present

## 2017-11-11 DIAGNOSIS — G8929 Other chronic pain: Secondary | ICD-10-CM | POA: Diagnosis present

## 2017-11-11 DIAGNOSIS — J690 Pneumonitis due to inhalation of food and vomit: Secondary | ICD-10-CM | POA: Diagnosis present

## 2017-11-11 DIAGNOSIS — D649 Anemia, unspecified: Secondary | ICD-10-CM | POA: Diagnosis not present

## 2017-11-11 DIAGNOSIS — T827XXA Infection and inflammatory reaction due to other cardiac and vascular devices, implants and grafts, initial encounter: Secondary | ICD-10-CM | POA: Diagnosis present

## 2017-11-11 DIAGNOSIS — J984 Other disorders of lung: Secondary | ICD-10-CM | POA: Diagnosis not present

## 2017-11-11 DIAGNOSIS — Z7982 Long term (current) use of aspirin: Secondary | ICD-10-CM | POA: Diagnosis not present

## 2017-11-11 DIAGNOSIS — R7881 Bacteremia: Secondary | ICD-10-CM | POA: Diagnosis not present

## 2017-11-11 DIAGNOSIS — R1312 Dysphagia, oropharyngeal phase: Secondary | ICD-10-CM | POA: Diagnosis not present

## 2017-11-11 DIAGNOSIS — R7303 Prediabetes: Secondary | ICD-10-CM | POA: Diagnosis not present

## 2017-11-11 DIAGNOSIS — Z79899 Other long term (current) drug therapy: Secondary | ICD-10-CM | POA: Diagnosis not present

## 2017-11-11 DIAGNOSIS — F339 Major depressive disorder, recurrent, unspecified: Secondary | ICD-10-CM | POA: Diagnosis not present

## 2017-11-11 DIAGNOSIS — B999 Unspecified infectious disease: Secondary | ICD-10-CM | POA: Diagnosis not present

## 2017-11-11 DIAGNOSIS — E875 Hyperkalemia: Secondary | ICD-10-CM | POA: Diagnosis not present

## 2017-11-11 DIAGNOSIS — Z992 Dependence on renal dialysis: Secondary | ICD-10-CM | POA: Diagnosis not present

## 2017-11-11 DIAGNOSIS — I739 Peripheral vascular disease, unspecified: Secondary | ICD-10-CM | POA: Diagnosis not present

## 2017-11-11 DIAGNOSIS — R569 Unspecified convulsions: Secondary | ICD-10-CM | POA: Diagnosis not present

## 2017-11-11 DIAGNOSIS — N179 Acute kidney failure, unspecified: Secondary | ICD-10-CM | POA: Diagnosis not present

## 2017-11-11 DIAGNOSIS — N2581 Secondary hyperparathyroidism of renal origin: Secondary | ICD-10-CM | POA: Diagnosis not present

## 2017-11-11 DIAGNOSIS — I509 Heart failure, unspecified: Secondary | ICD-10-CM | POA: Diagnosis present

## 2017-11-11 DIAGNOSIS — B957 Other staphylococcus as the cause of diseases classified elsewhere: Secondary | ICD-10-CM | POA: Diagnosis not present

## 2017-11-11 DIAGNOSIS — R05 Cough: Secondary | ICD-10-CM | POA: Diagnosis not present

## 2017-11-11 DIAGNOSIS — M109 Gout, unspecified: Secondary | ICD-10-CM | POA: Diagnosis not present

## 2017-11-11 DIAGNOSIS — Z880 Allergy status to penicillin: Secondary | ICD-10-CM | POA: Diagnosis not present

## 2017-11-11 DIAGNOSIS — M2550 Pain in unspecified joint: Secondary | ICD-10-CM | POA: Diagnosis not present

## 2017-11-11 DIAGNOSIS — Z89612 Acquired absence of left leg above knee: Secondary | ICD-10-CM | POA: Diagnosis not present

## 2017-11-11 DIAGNOSIS — F419 Anxiety disorder, unspecified: Secondary | ICD-10-CM | POA: Diagnosis present

## 2017-11-11 DIAGNOSIS — K219 Gastro-esophageal reflux disease without esophagitis: Secondary | ICD-10-CM | POA: Diagnosis present

## 2017-11-11 DIAGNOSIS — Z418 Encounter for other procedures for purposes other than remedying health state: Secondary | ICD-10-CM | POA: Diagnosis not present

## 2017-11-11 DIAGNOSIS — B958 Unspecified staphylococcus as the cause of diseases classified elsewhere: Secondary | ICD-10-CM | POA: Diagnosis not present

## 2017-11-29 DIAGNOSIS — E1122 Type 2 diabetes mellitus with diabetic chronic kidney disease: Secondary | ICD-10-CM | POA: Diagnosis not present

## 2017-11-29 DIAGNOSIS — I482 Chronic atrial fibrillation: Secondary | ICD-10-CM | POA: Diagnosis not present

## 2017-11-29 DIAGNOSIS — G40909 Epilepsy, unspecified, not intractable, without status epilepticus: Secondary | ICD-10-CM | POA: Diagnosis not present

## 2017-11-29 DIAGNOSIS — A4102 Sepsis due to Methicillin resistant Staphylococcus aureus: Secondary | ICD-10-CM | POA: Diagnosis not present

## 2017-11-29 DIAGNOSIS — Z89612 Acquired absence of left leg above knee: Secondary | ICD-10-CM | POA: Diagnosis not present

## 2017-11-29 DIAGNOSIS — E1169 Type 2 diabetes mellitus with other specified complication: Secondary | ICD-10-CM | POA: Diagnosis not present

## 2017-11-29 DIAGNOSIS — Z8673 Personal history of transient ischemic attack (TIA), and cerebral infarction without residual deficits: Secondary | ICD-10-CM | POA: Diagnosis not present

## 2017-11-29 DIAGNOSIS — L89222 Pressure ulcer of left hip, stage 2: Secondary | ICD-10-CM | POA: Diagnosis not present

## 2017-11-29 DIAGNOSIS — I639 Cerebral infarction, unspecified: Secondary | ICD-10-CM | POA: Diagnosis not present

## 2017-11-29 DIAGNOSIS — D631 Anemia in chronic kidney disease: Secondary | ICD-10-CM | POA: Diagnosis not present

## 2017-11-29 DIAGNOSIS — E785 Hyperlipidemia, unspecified: Secondary | ICD-10-CM | POA: Diagnosis not present

## 2017-11-29 DIAGNOSIS — I69954 Hemiplegia and hemiparesis following unspecified cerebrovascular disease affecting left non-dominant side: Secondary | ICD-10-CM | POA: Diagnosis not present

## 2017-11-29 DIAGNOSIS — T8249XA Other complication of vascular dialysis catheter, initial encounter: Secondary | ICD-10-CM | POA: Diagnosis not present

## 2017-11-29 DIAGNOSIS — H548 Legal blindness, as defined in USA: Secondary | ICD-10-CM | POA: Diagnosis not present

## 2017-11-29 DIAGNOSIS — M24542 Contracture, left hand: Secondary | ICD-10-CM | POA: Diagnosis not present

## 2017-11-29 DIAGNOSIS — K5900 Constipation, unspecified: Secondary | ICD-10-CM | POA: Diagnosis not present

## 2017-11-29 DIAGNOSIS — Z89611 Acquired absence of right leg above knee: Secondary | ICD-10-CM | POA: Diagnosis not present

## 2017-11-29 DIAGNOSIS — Z992 Dependence on renal dialysis: Secondary | ICD-10-CM | POA: Diagnosis not present

## 2017-11-29 DIAGNOSIS — I69351 Hemiplegia and hemiparesis following cerebral infarction affecting right dominant side: Secondary | ICD-10-CM | POA: Diagnosis not present

## 2017-11-29 DIAGNOSIS — J690 Pneumonitis due to inhalation of food and vomit: Secondary | ICD-10-CM | POA: Diagnosis not present

## 2017-11-29 DIAGNOSIS — E119 Type 2 diabetes mellitus without complications: Secondary | ICD-10-CM | POA: Diagnosis not present

## 2017-11-29 DIAGNOSIS — I69354 Hemiplegia and hemiparesis following cerebral infarction affecting left non-dominant side: Secondary | ICD-10-CM | POA: Diagnosis not present

## 2017-11-29 DIAGNOSIS — R293 Abnormal posture: Secondary | ICD-10-CM | POA: Diagnosis not present

## 2017-11-29 DIAGNOSIS — N186 End stage renal disease: Secondary | ICD-10-CM | POA: Diagnosis not present

## 2017-11-29 DIAGNOSIS — Z23 Encounter for immunization: Secondary | ICD-10-CM | POA: Diagnosis not present

## 2017-11-29 DIAGNOSIS — M109 Gout, unspecified: Secondary | ICD-10-CM | POA: Diagnosis not present

## 2017-11-29 DIAGNOSIS — Z89511 Acquired absence of right leg below knee: Secondary | ICD-10-CM | POA: Diagnosis not present

## 2017-11-29 DIAGNOSIS — R7881 Bacteremia: Secondary | ICD-10-CM | POA: Diagnosis not present

## 2017-11-29 DIAGNOSIS — A4181 Sepsis due to Enterococcus: Secondary | ICD-10-CM | POA: Diagnosis not present

## 2017-11-29 DIAGNOSIS — N2581 Secondary hyperparathyroidism of renal origin: Secondary | ICD-10-CM | POA: Diagnosis not present

## 2017-11-29 DIAGNOSIS — B952 Enterococcus as the cause of diseases classified elsewhere: Secondary | ICD-10-CM | POA: Diagnosis not present

## 2017-11-29 DIAGNOSIS — M2550 Pain in unspecified joint: Secondary | ICD-10-CM | POA: Diagnosis not present

## 2017-11-29 DIAGNOSIS — R569 Unspecified convulsions: Secondary | ICD-10-CM | POA: Diagnosis not present

## 2017-11-29 DIAGNOSIS — F339 Major depressive disorder, recurrent, unspecified: Secondary | ICD-10-CM | POA: Diagnosis not present

## 2017-11-29 DIAGNOSIS — I10 Essential (primary) hypertension: Secondary | ICD-10-CM | POA: Diagnosis not present

## 2017-11-29 DIAGNOSIS — Z418 Encounter for other procedures for purposes other than remedying health state: Secondary | ICD-10-CM | POA: Diagnosis not present

## 2017-11-29 DIAGNOSIS — I129 Hypertensive chronic kidney disease with stage 1 through stage 4 chronic kidney disease, or unspecified chronic kidney disease: Secondary | ICD-10-CM | POA: Diagnosis not present

## 2017-11-29 DIAGNOSIS — I429 Cardiomyopathy, unspecified: Secondary | ICD-10-CM | POA: Diagnosis not present

## 2017-11-29 DIAGNOSIS — M6281 Muscle weakness (generalized): Secondary | ICD-10-CM | POA: Diagnosis not present

## 2017-11-29 DIAGNOSIS — Z7401 Bed confinement status: Secondary | ICD-10-CM | POA: Diagnosis not present

## 2017-11-29 DIAGNOSIS — M24522 Contracture, left elbow: Secondary | ICD-10-CM | POA: Diagnosis not present

## 2017-11-29 DIAGNOSIS — R1312 Dysphagia, oropharyngeal phase: Secondary | ICD-10-CM | POA: Diagnosis not present

## 2017-11-29 DIAGNOSIS — I5022 Chronic systolic (congestive) heart failure: Secondary | ICD-10-CM | POA: Diagnosis not present

## 2017-11-29 DIAGNOSIS — I509 Heart failure, unspecified: Secondary | ICD-10-CM | POA: Diagnosis not present

## 2017-11-29 DIAGNOSIS — E559 Vitamin D deficiency, unspecified: Secondary | ICD-10-CM | POA: Diagnosis not present

## 2017-11-29 DIAGNOSIS — E875 Hyperkalemia: Secondary | ICD-10-CM | POA: Diagnosis not present

## 2017-11-29 DIAGNOSIS — I739 Peripheral vascular disease, unspecified: Secondary | ICD-10-CM | POA: Diagnosis not present

## 2017-12-02 DIAGNOSIS — N186 End stage renal disease: Secondary | ICD-10-CM | POA: Diagnosis not present

## 2017-12-02 DIAGNOSIS — N2581 Secondary hyperparathyroidism of renal origin: Secondary | ICD-10-CM | POA: Diagnosis not present

## 2017-12-02 DIAGNOSIS — R7881 Bacteremia: Secondary | ICD-10-CM | POA: Diagnosis not present

## 2017-12-02 DIAGNOSIS — D631 Anemia in chronic kidney disease: Secondary | ICD-10-CM | POA: Diagnosis not present

## 2017-12-02 DIAGNOSIS — Z418 Encounter for other procedures for purposes other than remedying health state: Secondary | ICD-10-CM | POA: Diagnosis not present

## 2017-12-03 DIAGNOSIS — N186 End stage renal disease: Secondary | ICD-10-CM | POA: Diagnosis not present

## 2017-12-03 DIAGNOSIS — E119 Type 2 diabetes mellitus without complications: Secondary | ICD-10-CM | POA: Diagnosis not present

## 2017-12-03 DIAGNOSIS — I69351 Hemiplegia and hemiparesis following cerebral infarction affecting right dominant side: Secondary | ICD-10-CM | POA: Diagnosis not present

## 2017-12-03 DIAGNOSIS — A4102 Sepsis due to Methicillin resistant Staphylococcus aureus: Secondary | ICD-10-CM | POA: Diagnosis not present

## 2017-12-04 DIAGNOSIS — N186 End stage renal disease: Secondary | ICD-10-CM | POA: Diagnosis not present

## 2017-12-04 DIAGNOSIS — R7881 Bacteremia: Secondary | ICD-10-CM | POA: Diagnosis not present

## 2017-12-04 DIAGNOSIS — Z418 Encounter for other procedures for purposes other than remedying health state: Secondary | ICD-10-CM | POA: Diagnosis not present

## 2017-12-04 DIAGNOSIS — D631 Anemia in chronic kidney disease: Secondary | ICD-10-CM | POA: Diagnosis not present

## 2017-12-04 DIAGNOSIS — N2581 Secondary hyperparathyroidism of renal origin: Secondary | ICD-10-CM | POA: Diagnosis not present

## 2017-12-05 DIAGNOSIS — R7881 Bacteremia: Secondary | ICD-10-CM | POA: Diagnosis not present

## 2017-12-05 DIAGNOSIS — N186 End stage renal disease: Secondary | ICD-10-CM | POA: Diagnosis not present

## 2017-12-05 DIAGNOSIS — I69354 Hemiplegia and hemiparesis following cerebral infarction affecting left non-dominant side: Secondary | ICD-10-CM | POA: Diagnosis not present

## 2017-12-05 DIAGNOSIS — M24542 Contracture, left hand: Secondary | ICD-10-CM | POA: Diagnosis not present

## 2017-12-06 DIAGNOSIS — Z418 Encounter for other procedures for purposes other than remedying health state: Secondary | ICD-10-CM | POA: Diagnosis not present

## 2017-12-06 DIAGNOSIS — N186 End stage renal disease: Secondary | ICD-10-CM | POA: Diagnosis not present

## 2017-12-06 DIAGNOSIS — R7881 Bacteremia: Secondary | ICD-10-CM | POA: Diagnosis not present

## 2017-12-06 DIAGNOSIS — D631 Anemia in chronic kidney disease: Secondary | ICD-10-CM | POA: Diagnosis not present

## 2017-12-06 DIAGNOSIS — N2581 Secondary hyperparathyroidism of renal origin: Secondary | ICD-10-CM | POA: Diagnosis not present

## 2017-12-09 DIAGNOSIS — D631 Anemia in chronic kidney disease: Secondary | ICD-10-CM | POA: Diagnosis not present

## 2017-12-09 DIAGNOSIS — N186 End stage renal disease: Secondary | ICD-10-CM | POA: Diagnosis not present

## 2017-12-09 DIAGNOSIS — R7881 Bacteremia: Secondary | ICD-10-CM | POA: Diagnosis not present

## 2017-12-09 DIAGNOSIS — N2581 Secondary hyperparathyroidism of renal origin: Secondary | ICD-10-CM | POA: Diagnosis not present

## 2017-12-09 DIAGNOSIS — I10 Essential (primary) hypertension: Secondary | ICD-10-CM | POA: Diagnosis not present

## 2017-12-10 DIAGNOSIS — N186 End stage renal disease: Secondary | ICD-10-CM | POA: Diagnosis not present

## 2017-12-10 DIAGNOSIS — I69354 Hemiplegia and hemiparesis following cerebral infarction affecting left non-dominant side: Secondary | ICD-10-CM | POA: Diagnosis not present

## 2017-12-10 DIAGNOSIS — E1122 Type 2 diabetes mellitus with diabetic chronic kidney disease: Secondary | ICD-10-CM | POA: Diagnosis not present

## 2017-12-10 DIAGNOSIS — R7881 Bacteremia: Secondary | ICD-10-CM | POA: Diagnosis not present

## 2017-12-12 DIAGNOSIS — N2581 Secondary hyperparathyroidism of renal origin: Secondary | ICD-10-CM | POA: Diagnosis not present

## 2017-12-12 DIAGNOSIS — N186 End stage renal disease: Secondary | ICD-10-CM | POA: Diagnosis not present

## 2017-12-12 DIAGNOSIS — I10 Essential (primary) hypertension: Secondary | ICD-10-CM | POA: Diagnosis not present

## 2017-12-12 DIAGNOSIS — R7881 Bacteremia: Secondary | ICD-10-CM | POA: Diagnosis not present

## 2017-12-12 DIAGNOSIS — Z418 Encounter for other procedures for purposes other than remedying health state: Secondary | ICD-10-CM | POA: Diagnosis not present

## 2017-12-12 DIAGNOSIS — D631 Anemia in chronic kidney disease: Secondary | ICD-10-CM | POA: Diagnosis not present

## 2017-12-13 DIAGNOSIS — N186 End stage renal disease: Secondary | ICD-10-CM | POA: Diagnosis not present

## 2017-12-13 DIAGNOSIS — R7881 Bacteremia: Secondary | ICD-10-CM | POA: Diagnosis not present

## 2017-12-13 DIAGNOSIS — N2581 Secondary hyperparathyroidism of renal origin: Secondary | ICD-10-CM | POA: Diagnosis not present

## 2017-12-13 DIAGNOSIS — I10 Essential (primary) hypertension: Secondary | ICD-10-CM | POA: Diagnosis not present

## 2017-12-13 DIAGNOSIS — Z418 Encounter for other procedures for purposes other than remedying health state: Secondary | ICD-10-CM | POA: Diagnosis not present

## 2017-12-13 DIAGNOSIS — D631 Anemia in chronic kidney disease: Secondary | ICD-10-CM | POA: Diagnosis not present

## 2017-12-16 DIAGNOSIS — Z418 Encounter for other procedures for purposes other than remedying health state: Secondary | ICD-10-CM | POA: Diagnosis not present

## 2017-12-16 DIAGNOSIS — I10 Essential (primary) hypertension: Secondary | ICD-10-CM | POA: Diagnosis not present

## 2017-12-16 DIAGNOSIS — N186 End stage renal disease: Secondary | ICD-10-CM | POA: Diagnosis not present

## 2017-12-16 DIAGNOSIS — D631 Anemia in chronic kidney disease: Secondary | ICD-10-CM | POA: Diagnosis not present

## 2017-12-16 DIAGNOSIS — N2581 Secondary hyperparathyroidism of renal origin: Secondary | ICD-10-CM | POA: Diagnosis not present

## 2017-12-16 DIAGNOSIS — R7881 Bacteremia: Secondary | ICD-10-CM | POA: Diagnosis not present

## 2017-12-18 DIAGNOSIS — I69351 Hemiplegia and hemiparesis following cerebral infarction affecting right dominant side: Secondary | ICD-10-CM | POA: Diagnosis not present

## 2017-12-18 DIAGNOSIS — Z418 Encounter for other procedures for purposes other than remedying health state: Secondary | ICD-10-CM | POA: Diagnosis not present

## 2017-12-18 DIAGNOSIS — N2581 Secondary hyperparathyroidism of renal origin: Secondary | ICD-10-CM | POA: Diagnosis not present

## 2017-12-18 DIAGNOSIS — R7881 Bacteremia: Secondary | ICD-10-CM | POA: Diagnosis not present

## 2017-12-18 DIAGNOSIS — N186 End stage renal disease: Secondary | ICD-10-CM | POA: Diagnosis not present

## 2017-12-18 DIAGNOSIS — D631 Anemia in chronic kidney disease: Secondary | ICD-10-CM | POA: Diagnosis not present

## 2017-12-18 DIAGNOSIS — I10 Essential (primary) hypertension: Secondary | ICD-10-CM | POA: Diagnosis not present

## 2017-12-18 DIAGNOSIS — I5022 Chronic systolic (congestive) heart failure: Secondary | ICD-10-CM | POA: Diagnosis not present

## 2017-12-20 DIAGNOSIS — D631 Anemia in chronic kidney disease: Secondary | ICD-10-CM | POA: Diagnosis not present

## 2017-12-20 DIAGNOSIS — N186 End stage renal disease: Secondary | ICD-10-CM | POA: Diagnosis not present

## 2017-12-20 DIAGNOSIS — I10 Essential (primary) hypertension: Secondary | ICD-10-CM | POA: Diagnosis not present

## 2017-12-20 DIAGNOSIS — R7881 Bacteremia: Secondary | ICD-10-CM | POA: Diagnosis not present

## 2017-12-20 DIAGNOSIS — N2581 Secondary hyperparathyroidism of renal origin: Secondary | ICD-10-CM | POA: Diagnosis not present

## 2017-12-20 DIAGNOSIS — Z418 Encounter for other procedures for purposes other than remedying health state: Secondary | ICD-10-CM | POA: Diagnosis not present

## 2017-12-23 DIAGNOSIS — D631 Anemia in chronic kidney disease: Secondary | ICD-10-CM | POA: Diagnosis not present

## 2017-12-23 DIAGNOSIS — I10 Essential (primary) hypertension: Secondary | ICD-10-CM | POA: Diagnosis not present

## 2017-12-23 DIAGNOSIS — N2581 Secondary hyperparathyroidism of renal origin: Secondary | ICD-10-CM | POA: Diagnosis not present

## 2017-12-23 DIAGNOSIS — R7881 Bacteremia: Secondary | ICD-10-CM | POA: Diagnosis not present

## 2017-12-23 DIAGNOSIS — Z418 Encounter for other procedures for purposes other than remedying health state: Secondary | ICD-10-CM | POA: Diagnosis not present

## 2017-12-23 DIAGNOSIS — N186 End stage renal disease: Secondary | ICD-10-CM | POA: Diagnosis not present

## 2017-12-24 DIAGNOSIS — G40909 Epilepsy, unspecified, not intractable, without status epilepticus: Secondary | ICD-10-CM | POA: Diagnosis not present

## 2017-12-24 DIAGNOSIS — I69354 Hemiplegia and hemiparesis following cerebral infarction affecting left non-dominant side: Secondary | ICD-10-CM | POA: Diagnosis not present

## 2017-12-24 DIAGNOSIS — I10 Essential (primary) hypertension: Secondary | ICD-10-CM | POA: Diagnosis not present

## 2017-12-24 DIAGNOSIS — R7881 Bacteremia: Secondary | ICD-10-CM | POA: Diagnosis not present

## 2017-12-25 DIAGNOSIS — Z418 Encounter for other procedures for purposes other than remedying health state: Secondary | ICD-10-CM | POA: Diagnosis not present

## 2017-12-25 DIAGNOSIS — N186 End stage renal disease: Secondary | ICD-10-CM | POA: Diagnosis not present

## 2017-12-25 DIAGNOSIS — I10 Essential (primary) hypertension: Secondary | ICD-10-CM | POA: Diagnosis not present

## 2017-12-25 DIAGNOSIS — N2581 Secondary hyperparathyroidism of renal origin: Secondary | ICD-10-CM | POA: Diagnosis not present

## 2017-12-25 DIAGNOSIS — R7881 Bacteremia: Secondary | ICD-10-CM | POA: Diagnosis not present

## 2017-12-25 DIAGNOSIS — D631 Anemia in chronic kidney disease: Secondary | ICD-10-CM | POA: Diagnosis not present

## 2017-12-27 DIAGNOSIS — I10 Essential (primary) hypertension: Secondary | ICD-10-CM | POA: Diagnosis not present

## 2017-12-27 DIAGNOSIS — N2581 Secondary hyperparathyroidism of renal origin: Secondary | ICD-10-CM | POA: Diagnosis not present

## 2017-12-27 DIAGNOSIS — R7881 Bacteremia: Secondary | ICD-10-CM | POA: Diagnosis not present

## 2017-12-27 DIAGNOSIS — Z418 Encounter for other procedures for purposes other than remedying health state: Secondary | ICD-10-CM | POA: Diagnosis not present

## 2017-12-27 DIAGNOSIS — N186 End stage renal disease: Secondary | ICD-10-CM | POA: Diagnosis not present

## 2017-12-27 DIAGNOSIS — D631 Anemia in chronic kidney disease: Secondary | ICD-10-CM | POA: Diagnosis not present

## 2017-12-30 DIAGNOSIS — I69351 Hemiplegia and hemiparesis following cerebral infarction affecting right dominant side: Secondary | ICD-10-CM | POA: Diagnosis not present

## 2017-12-30 DIAGNOSIS — N186 End stage renal disease: Secondary | ICD-10-CM | POA: Diagnosis not present

## 2017-12-30 DIAGNOSIS — I739 Peripheral vascular disease, unspecified: Secondary | ICD-10-CM | POA: Diagnosis not present

## 2017-12-30 DIAGNOSIS — E119 Type 2 diabetes mellitus without complications: Secondary | ICD-10-CM | POA: Diagnosis not present

## 2017-12-31 DIAGNOSIS — N186 End stage renal disease: Secondary | ICD-10-CM | POA: Diagnosis not present

## 2017-12-31 DIAGNOSIS — I10 Essential (primary) hypertension: Secondary | ICD-10-CM | POA: Diagnosis not present

## 2017-12-31 DIAGNOSIS — Z418 Encounter for other procedures for purposes other than remedying health state: Secondary | ICD-10-CM | POA: Diagnosis not present

## 2017-12-31 DIAGNOSIS — N2581 Secondary hyperparathyroidism of renal origin: Secondary | ICD-10-CM | POA: Diagnosis not present

## 2017-12-31 DIAGNOSIS — R7881 Bacteremia: Secondary | ICD-10-CM | POA: Diagnosis not present

## 2017-12-31 DIAGNOSIS — D631 Anemia in chronic kidney disease: Secondary | ICD-10-CM | POA: Diagnosis not present

## 2018-01-01 DIAGNOSIS — N186 End stage renal disease: Secondary | ICD-10-CM | POA: Diagnosis not present

## 2018-01-01 DIAGNOSIS — I10 Essential (primary) hypertension: Secondary | ICD-10-CM | POA: Diagnosis not present

## 2018-01-01 DIAGNOSIS — Z418 Encounter for other procedures for purposes other than remedying health state: Secondary | ICD-10-CM | POA: Diagnosis not present

## 2018-01-01 DIAGNOSIS — R7881 Bacteremia: Secondary | ICD-10-CM | POA: Diagnosis not present

## 2018-01-01 DIAGNOSIS — D631 Anemia in chronic kidney disease: Secondary | ICD-10-CM | POA: Diagnosis not present

## 2018-01-01 DIAGNOSIS — N2581 Secondary hyperparathyroidism of renal origin: Secondary | ICD-10-CM | POA: Diagnosis not present

## 2018-01-02 DIAGNOSIS — E1122 Type 2 diabetes mellitus with diabetic chronic kidney disease: Secondary | ICD-10-CM | POA: Diagnosis not present

## 2018-01-02 DIAGNOSIS — A4102 Sepsis due to Methicillin resistant Staphylococcus aureus: Secondary | ICD-10-CM | POA: Diagnosis not present

## 2018-01-02 DIAGNOSIS — N186 End stage renal disease: Secondary | ICD-10-CM | POA: Diagnosis not present

## 2018-01-02 DIAGNOSIS — I10 Essential (primary) hypertension: Secondary | ICD-10-CM | POA: Diagnosis not present

## 2018-01-03 DIAGNOSIS — N186 End stage renal disease: Secondary | ICD-10-CM | POA: Diagnosis not present

## 2018-01-03 DIAGNOSIS — D631 Anemia in chronic kidney disease: Secondary | ICD-10-CM | POA: Diagnosis not present

## 2018-01-03 DIAGNOSIS — N2581 Secondary hyperparathyroidism of renal origin: Secondary | ICD-10-CM | POA: Diagnosis not present

## 2018-01-03 DIAGNOSIS — Z418 Encounter for other procedures for purposes other than remedying health state: Secondary | ICD-10-CM | POA: Diagnosis not present

## 2018-01-03 DIAGNOSIS — I10 Essential (primary) hypertension: Secondary | ICD-10-CM | POA: Diagnosis not present

## 2018-01-03 DIAGNOSIS — R7881 Bacteremia: Secondary | ICD-10-CM | POA: Diagnosis not present

## 2018-01-06 DIAGNOSIS — N2581 Secondary hyperparathyroidism of renal origin: Secondary | ICD-10-CM | POA: Diagnosis not present

## 2018-01-06 DIAGNOSIS — R7881 Bacteremia: Secondary | ICD-10-CM | POA: Diagnosis not present

## 2018-01-06 DIAGNOSIS — Z418 Encounter for other procedures for purposes other than remedying health state: Secondary | ICD-10-CM | POA: Diagnosis not present

## 2018-01-06 DIAGNOSIS — N186 End stage renal disease: Secondary | ICD-10-CM | POA: Diagnosis not present

## 2018-01-06 DIAGNOSIS — I10 Essential (primary) hypertension: Secondary | ICD-10-CM | POA: Diagnosis not present

## 2018-01-06 DIAGNOSIS — D631 Anemia in chronic kidney disease: Secondary | ICD-10-CM | POA: Diagnosis not present

## 2018-01-08 DIAGNOSIS — R7881 Bacteremia: Secondary | ICD-10-CM | POA: Diagnosis not present

## 2018-01-08 DIAGNOSIS — I10 Essential (primary) hypertension: Secondary | ICD-10-CM | POA: Diagnosis not present

## 2018-01-08 DIAGNOSIS — D631 Anemia in chronic kidney disease: Secondary | ICD-10-CM | POA: Diagnosis not present

## 2018-01-08 DIAGNOSIS — N186 End stage renal disease: Secondary | ICD-10-CM | POA: Diagnosis not present

## 2018-01-08 DIAGNOSIS — N2581 Secondary hyperparathyroidism of renal origin: Secondary | ICD-10-CM | POA: Diagnosis not present

## 2018-01-08 DIAGNOSIS — Z418 Encounter for other procedures for purposes other than remedying health state: Secondary | ICD-10-CM | POA: Diagnosis not present

## 2018-01-09 DIAGNOSIS — I10 Essential (primary) hypertension: Secondary | ICD-10-CM | POA: Diagnosis not present

## 2018-01-09 DIAGNOSIS — D631 Anemia in chronic kidney disease: Secondary | ICD-10-CM | POA: Diagnosis not present

## 2018-01-09 DIAGNOSIS — N186 End stage renal disease: Secondary | ICD-10-CM | POA: Diagnosis not present

## 2018-01-10 DIAGNOSIS — D631 Anemia in chronic kidney disease: Secondary | ICD-10-CM | POA: Diagnosis not present

## 2018-01-10 DIAGNOSIS — T8249XA Other complication of vascular dialysis catheter, initial encounter: Secondary | ICD-10-CM | POA: Diagnosis not present

## 2018-01-10 DIAGNOSIS — N186 End stage renal disease: Secondary | ICD-10-CM | POA: Diagnosis not present

## 2018-01-10 DIAGNOSIS — Z23 Encounter for immunization: Secondary | ICD-10-CM | POA: Diagnosis not present

## 2018-01-10 DIAGNOSIS — N2581 Secondary hyperparathyroidism of renal origin: Secondary | ICD-10-CM | POA: Diagnosis not present

## 2018-01-10 DIAGNOSIS — Z418 Encounter for other procedures for purposes other than remedying health state: Secondary | ICD-10-CM | POA: Diagnosis not present

## 2018-01-13 DIAGNOSIS — T8249XA Other complication of vascular dialysis catheter, initial encounter: Secondary | ICD-10-CM | POA: Diagnosis not present

## 2018-01-13 DIAGNOSIS — N2581 Secondary hyperparathyroidism of renal origin: Secondary | ICD-10-CM | POA: Diagnosis not present

## 2018-01-13 DIAGNOSIS — D631 Anemia in chronic kidney disease: Secondary | ICD-10-CM | POA: Diagnosis not present

## 2018-01-13 DIAGNOSIS — Z418 Encounter for other procedures for purposes other than remedying health state: Secondary | ICD-10-CM | POA: Diagnosis not present

## 2018-01-13 DIAGNOSIS — N186 End stage renal disease: Secondary | ICD-10-CM | POA: Diagnosis not present

## 2018-01-13 DIAGNOSIS — Z23 Encounter for immunization: Secondary | ICD-10-CM | POA: Diagnosis not present

## 2018-01-15 DIAGNOSIS — T8249XA Other complication of vascular dialysis catheter, initial encounter: Secondary | ICD-10-CM | POA: Diagnosis not present

## 2018-01-15 DIAGNOSIS — Z418 Encounter for other procedures for purposes other than remedying health state: Secondary | ICD-10-CM | POA: Diagnosis not present

## 2018-01-15 DIAGNOSIS — N186 End stage renal disease: Secondary | ICD-10-CM | POA: Diagnosis not present

## 2018-01-15 DIAGNOSIS — Z23 Encounter for immunization: Secondary | ICD-10-CM | POA: Diagnosis not present

## 2018-01-15 DIAGNOSIS — N2581 Secondary hyperparathyroidism of renal origin: Secondary | ICD-10-CM | POA: Diagnosis not present

## 2018-01-15 DIAGNOSIS — D631 Anemia in chronic kidney disease: Secondary | ICD-10-CM | POA: Diagnosis not present

## 2018-01-17 DIAGNOSIS — T8249XA Other complication of vascular dialysis catheter, initial encounter: Secondary | ICD-10-CM | POA: Diagnosis not present

## 2018-01-17 DIAGNOSIS — D631 Anemia in chronic kidney disease: Secondary | ICD-10-CM | POA: Diagnosis not present

## 2018-01-17 DIAGNOSIS — N186 End stage renal disease: Secondary | ICD-10-CM | POA: Diagnosis not present

## 2018-01-17 DIAGNOSIS — N2581 Secondary hyperparathyroidism of renal origin: Secondary | ICD-10-CM | POA: Diagnosis not present

## 2018-01-17 DIAGNOSIS — Z418 Encounter for other procedures for purposes other than remedying health state: Secondary | ICD-10-CM | POA: Diagnosis not present

## 2018-01-17 DIAGNOSIS — Z23 Encounter for immunization: Secondary | ICD-10-CM | POA: Diagnosis not present

## 2018-01-20 DIAGNOSIS — Z23 Encounter for immunization: Secondary | ICD-10-CM | POA: Diagnosis not present

## 2018-01-20 DIAGNOSIS — N186 End stage renal disease: Secondary | ICD-10-CM | POA: Diagnosis not present

## 2018-01-20 DIAGNOSIS — G8190 Hemiplegia, unspecified affecting unspecified side: Secondary | ICD-10-CM | POA: Diagnosis not present

## 2018-01-20 DIAGNOSIS — D631 Anemia in chronic kidney disease: Secondary | ICD-10-CM | POA: Diagnosis not present

## 2018-01-20 DIAGNOSIS — M2550 Pain in unspecified joint: Secondary | ICD-10-CM | POA: Diagnosis not present

## 2018-01-20 DIAGNOSIS — Z418 Encounter for other procedures for purposes other than remedying health state: Secondary | ICD-10-CM | POA: Diagnosis not present

## 2018-01-20 DIAGNOSIS — N2581 Secondary hyperparathyroidism of renal origin: Secondary | ICD-10-CM | POA: Diagnosis not present

## 2018-01-20 DIAGNOSIS — Z7401 Bed confinement status: Secondary | ICD-10-CM | POA: Diagnosis not present

## 2018-01-20 DIAGNOSIS — T8249XA Other complication of vascular dialysis catheter, initial encounter: Secondary | ICD-10-CM | POA: Diagnosis not present

## 2018-01-22 DIAGNOSIS — T8249XA Other complication of vascular dialysis catheter, initial encounter: Secondary | ICD-10-CM | POA: Diagnosis not present

## 2018-01-22 DIAGNOSIS — D631 Anemia in chronic kidney disease: Secondary | ICD-10-CM | POA: Diagnosis not present

## 2018-01-22 DIAGNOSIS — Z23 Encounter for immunization: Secondary | ICD-10-CM | POA: Diagnosis not present

## 2018-01-22 DIAGNOSIS — N186 End stage renal disease: Secondary | ICD-10-CM | POA: Diagnosis not present

## 2018-01-22 DIAGNOSIS — N2581 Secondary hyperparathyroidism of renal origin: Secondary | ICD-10-CM | POA: Diagnosis not present

## 2018-01-22 DIAGNOSIS — Z418 Encounter for other procedures for purposes other than remedying health state: Secondary | ICD-10-CM | POA: Diagnosis not present

## 2018-01-23 DIAGNOSIS — N390 Urinary tract infection, site not specified: Secondary | ICD-10-CM | POA: Diagnosis not present

## 2018-01-24 DIAGNOSIS — Z418 Encounter for other procedures for purposes other than remedying health state: Secondary | ICD-10-CM | POA: Diagnosis not present

## 2018-01-24 DIAGNOSIS — Z23 Encounter for immunization: Secondary | ICD-10-CM | POA: Diagnosis not present

## 2018-01-24 DIAGNOSIS — D631 Anemia in chronic kidney disease: Secondary | ICD-10-CM | POA: Diagnosis not present

## 2018-01-24 DIAGNOSIS — T8249XA Other complication of vascular dialysis catheter, initial encounter: Secondary | ICD-10-CM | POA: Diagnosis not present

## 2018-01-24 DIAGNOSIS — N2581 Secondary hyperparathyroidism of renal origin: Secondary | ICD-10-CM | POA: Diagnosis not present

## 2018-01-24 DIAGNOSIS — N186 End stage renal disease: Secondary | ICD-10-CM | POA: Diagnosis not present

## 2018-01-26 DIAGNOSIS — N185 Chronic kidney disease, stage 5: Secondary | ICD-10-CM | POA: Diagnosis not present

## 2018-01-26 DIAGNOSIS — E1122 Type 2 diabetes mellitus with diabetic chronic kidney disease: Secondary | ICD-10-CM | POA: Diagnosis not present

## 2018-01-26 DIAGNOSIS — I69351 Hemiplegia and hemiparesis following cerebral infarction affecting right dominant side: Secondary | ICD-10-CM | POA: Diagnosis not present

## 2018-01-26 DIAGNOSIS — N250 Renal osteodystrophy: Secondary | ICD-10-CM | POA: Diagnosis not present

## 2018-01-28 DIAGNOSIS — Z418 Encounter for other procedures for purposes other than remedying health state: Secondary | ICD-10-CM | POA: Diagnosis not present

## 2018-01-28 DIAGNOSIS — Z23 Encounter for immunization: Secondary | ICD-10-CM | POA: Diagnosis not present

## 2018-01-28 DIAGNOSIS — N2581 Secondary hyperparathyroidism of renal origin: Secondary | ICD-10-CM | POA: Diagnosis not present

## 2018-01-28 DIAGNOSIS — N186 End stage renal disease: Secondary | ICD-10-CM | POA: Diagnosis not present

## 2018-01-28 DIAGNOSIS — D631 Anemia in chronic kidney disease: Secondary | ICD-10-CM | POA: Diagnosis not present

## 2018-01-28 DIAGNOSIS — T8249XA Other complication of vascular dialysis catheter, initial encounter: Secondary | ICD-10-CM | POA: Diagnosis not present

## 2018-01-29 DIAGNOSIS — Z23 Encounter for immunization: Secondary | ICD-10-CM | POA: Diagnosis not present

## 2018-01-29 DIAGNOSIS — N186 End stage renal disease: Secondary | ICD-10-CM | POA: Diagnosis not present

## 2018-01-29 DIAGNOSIS — D631 Anemia in chronic kidney disease: Secondary | ICD-10-CM | POA: Diagnosis not present

## 2018-01-29 DIAGNOSIS — Z418 Encounter for other procedures for purposes other than remedying health state: Secondary | ICD-10-CM | POA: Diagnosis not present

## 2018-01-29 DIAGNOSIS — T8249XA Other complication of vascular dialysis catheter, initial encounter: Secondary | ICD-10-CM | POA: Diagnosis not present

## 2018-01-29 DIAGNOSIS — N2581 Secondary hyperparathyroidism of renal origin: Secondary | ICD-10-CM | POA: Diagnosis not present

## 2018-01-30 DIAGNOSIS — N390 Urinary tract infection, site not specified: Secondary | ICD-10-CM | POA: Diagnosis not present

## 2018-01-30 DIAGNOSIS — I132 Hypertensive heart and chronic kidney disease with heart failure and with stage 5 chronic kidney disease, or end stage renal disease: Secondary | ICD-10-CM | POA: Diagnosis not present

## 2018-01-30 DIAGNOSIS — I5022 Chronic systolic (congestive) heart failure: Secondary | ICD-10-CM | POA: Diagnosis not present

## 2018-01-30 DIAGNOSIS — N186 End stage renal disease: Secondary | ICD-10-CM | POA: Diagnosis not present

## 2018-01-31 DIAGNOSIS — T8249XA Other complication of vascular dialysis catheter, initial encounter: Secondary | ICD-10-CM | POA: Diagnosis not present

## 2018-01-31 DIAGNOSIS — N186 End stage renal disease: Secondary | ICD-10-CM | POA: Diagnosis not present

## 2018-01-31 DIAGNOSIS — D631 Anemia in chronic kidney disease: Secondary | ICD-10-CM | POA: Diagnosis not present

## 2018-01-31 DIAGNOSIS — N2581 Secondary hyperparathyroidism of renal origin: Secondary | ICD-10-CM | POA: Diagnosis not present

## 2018-01-31 DIAGNOSIS — Z23 Encounter for immunization: Secondary | ICD-10-CM | POA: Diagnosis not present

## 2018-01-31 DIAGNOSIS — Z418 Encounter for other procedures for purposes other than remedying health state: Secondary | ICD-10-CM | POA: Diagnosis not present

## 2018-02-03 DIAGNOSIS — N2581 Secondary hyperparathyroidism of renal origin: Secondary | ICD-10-CM | POA: Diagnosis not present

## 2018-02-03 DIAGNOSIS — Z418 Encounter for other procedures for purposes other than remedying health state: Secondary | ICD-10-CM | POA: Diagnosis not present

## 2018-02-03 DIAGNOSIS — D631 Anemia in chronic kidney disease: Secondary | ICD-10-CM | POA: Diagnosis not present

## 2018-02-03 DIAGNOSIS — T8249XA Other complication of vascular dialysis catheter, initial encounter: Secondary | ICD-10-CM | POA: Diagnosis not present

## 2018-02-03 DIAGNOSIS — N186 End stage renal disease: Secondary | ICD-10-CM | POA: Diagnosis not present

## 2018-02-03 DIAGNOSIS — Z23 Encounter for immunization: Secondary | ICD-10-CM | POA: Diagnosis not present

## 2018-02-05 DIAGNOSIS — R404 Transient alteration of awareness: Secondary | ICD-10-CM | POA: Diagnosis not present

## 2018-02-05 DIAGNOSIS — T8249XA Other complication of vascular dialysis catheter, initial encounter: Secondary | ICD-10-CM | POA: Diagnosis not present

## 2018-02-05 DIAGNOSIS — N2581 Secondary hyperparathyroidism of renal origin: Secondary | ICD-10-CM | POA: Diagnosis not present

## 2018-02-05 DIAGNOSIS — M2550 Pain in unspecified joint: Secondary | ICD-10-CM | POA: Diagnosis not present

## 2018-02-05 DIAGNOSIS — N186 End stage renal disease: Secondary | ICD-10-CM | POA: Diagnosis not present

## 2018-02-05 DIAGNOSIS — Z418 Encounter for other procedures for purposes other than remedying health state: Secondary | ICD-10-CM | POA: Diagnosis not present

## 2018-02-05 DIAGNOSIS — Z7401 Bed confinement status: Secondary | ICD-10-CM | POA: Diagnosis not present

## 2018-02-05 DIAGNOSIS — D631 Anemia in chronic kidney disease: Secondary | ICD-10-CM | POA: Diagnosis not present

## 2018-02-05 DIAGNOSIS — Z23 Encounter for immunization: Secondary | ICD-10-CM | POA: Diagnosis not present

## 2018-02-05 DIAGNOSIS — I959 Hypotension, unspecified: Secondary | ICD-10-CM | POA: Diagnosis not present

## 2018-02-07 DIAGNOSIS — N186 End stage renal disease: Secondary | ICD-10-CM | POA: Diagnosis not present

## 2018-02-07 DIAGNOSIS — T8249XA Other complication of vascular dialysis catheter, initial encounter: Secondary | ICD-10-CM | POA: Diagnosis not present

## 2018-02-07 DIAGNOSIS — Z418 Encounter for other procedures for purposes other than remedying health state: Secondary | ICD-10-CM | POA: Diagnosis not present

## 2018-02-07 DIAGNOSIS — N2581 Secondary hyperparathyroidism of renal origin: Secondary | ICD-10-CM | POA: Diagnosis not present

## 2018-02-07 DIAGNOSIS — Z23 Encounter for immunization: Secondary | ICD-10-CM | POA: Diagnosis not present

## 2018-02-07 DIAGNOSIS — D631 Anemia in chronic kidney disease: Secondary | ICD-10-CM | POA: Diagnosis not present

## 2018-02-09 DIAGNOSIS — N186 End stage renal disease: Secondary | ICD-10-CM | POA: Diagnosis not present

## 2018-02-09 DIAGNOSIS — I10 Essential (primary) hypertension: Secondary | ICD-10-CM | POA: Diagnosis not present

## 2018-02-09 DIAGNOSIS — D631 Anemia in chronic kidney disease: Secondary | ICD-10-CM | POA: Diagnosis not present

## 2018-02-11 DIAGNOSIS — D631 Anemia in chronic kidney disease: Secondary | ICD-10-CM | POA: Diagnosis not present

## 2018-02-11 DIAGNOSIS — Z418 Encounter for other procedures for purposes other than remedying health state: Secondary | ICD-10-CM | POA: Diagnosis not present

## 2018-02-11 DIAGNOSIS — N2581 Secondary hyperparathyroidism of renal origin: Secondary | ICD-10-CM | POA: Diagnosis not present

## 2018-02-11 DIAGNOSIS — N186 End stage renal disease: Secondary | ICD-10-CM | POA: Diagnosis not present

## 2018-02-11 DIAGNOSIS — D509 Iron deficiency anemia, unspecified: Secondary | ICD-10-CM | POA: Diagnosis not present

## 2018-02-11 DIAGNOSIS — Z23 Encounter for immunization: Secondary | ICD-10-CM | POA: Diagnosis not present

## 2018-02-12 DIAGNOSIS — Z418 Encounter for other procedures for purposes other than remedying health state: Secondary | ICD-10-CM | POA: Diagnosis not present

## 2018-02-12 DIAGNOSIS — N186 End stage renal disease: Secondary | ICD-10-CM | POA: Diagnosis not present

## 2018-02-12 DIAGNOSIS — D509 Iron deficiency anemia, unspecified: Secondary | ICD-10-CM | POA: Diagnosis not present

## 2018-02-12 DIAGNOSIS — Z7401 Bed confinement status: Secondary | ICD-10-CM | POA: Diagnosis not present

## 2018-02-12 DIAGNOSIS — D631 Anemia in chronic kidney disease: Secondary | ICD-10-CM | POA: Diagnosis not present

## 2018-02-12 DIAGNOSIS — N2581 Secondary hyperparathyroidism of renal origin: Secondary | ICD-10-CM | POA: Diagnosis not present

## 2018-02-12 DIAGNOSIS — M2550 Pain in unspecified joint: Secondary | ICD-10-CM | POA: Diagnosis not present

## 2018-02-12 DIAGNOSIS — Z23 Encounter for immunization: Secondary | ICD-10-CM | POA: Diagnosis not present

## 2018-02-14 DIAGNOSIS — N2581 Secondary hyperparathyroidism of renal origin: Secondary | ICD-10-CM | POA: Diagnosis not present

## 2018-02-14 DIAGNOSIS — N186 End stage renal disease: Secondary | ICD-10-CM | POA: Diagnosis not present

## 2018-02-14 DIAGNOSIS — D631 Anemia in chronic kidney disease: Secondary | ICD-10-CM | POA: Diagnosis not present

## 2018-02-14 DIAGNOSIS — Z23 Encounter for immunization: Secondary | ICD-10-CM | POA: Diagnosis not present

## 2018-02-14 DIAGNOSIS — Z418 Encounter for other procedures for purposes other than remedying health state: Secondary | ICD-10-CM | POA: Diagnosis not present

## 2018-02-14 DIAGNOSIS — D509 Iron deficiency anemia, unspecified: Secondary | ICD-10-CM | POA: Diagnosis not present

## 2018-02-17 DIAGNOSIS — N2581 Secondary hyperparathyroidism of renal origin: Secondary | ICD-10-CM | POA: Diagnosis not present

## 2018-02-17 DIAGNOSIS — N186 End stage renal disease: Secondary | ICD-10-CM | POA: Diagnosis not present

## 2018-02-17 DIAGNOSIS — D509 Iron deficiency anemia, unspecified: Secondary | ICD-10-CM | POA: Diagnosis not present

## 2018-02-17 DIAGNOSIS — Z23 Encounter for immunization: Secondary | ICD-10-CM | POA: Diagnosis not present

## 2018-02-17 DIAGNOSIS — Z418 Encounter for other procedures for purposes other than remedying health state: Secondary | ICD-10-CM | POA: Diagnosis not present

## 2018-02-17 DIAGNOSIS — D631 Anemia in chronic kidney disease: Secondary | ICD-10-CM | POA: Diagnosis not present

## 2018-02-19 DIAGNOSIS — N186 End stage renal disease: Secondary | ICD-10-CM | POA: Diagnosis not present

## 2018-02-19 DIAGNOSIS — D631 Anemia in chronic kidney disease: Secondary | ICD-10-CM | POA: Diagnosis not present

## 2018-02-19 DIAGNOSIS — D509 Iron deficiency anemia, unspecified: Secondary | ICD-10-CM | POA: Diagnosis not present

## 2018-02-19 DIAGNOSIS — N2581 Secondary hyperparathyroidism of renal origin: Secondary | ICD-10-CM | POA: Diagnosis not present

## 2018-02-19 DIAGNOSIS — Z23 Encounter for immunization: Secondary | ICD-10-CM | POA: Diagnosis not present

## 2018-02-19 DIAGNOSIS — Z418 Encounter for other procedures for purposes other than remedying health state: Secondary | ICD-10-CM | POA: Diagnosis not present

## 2018-02-21 DIAGNOSIS — Z23 Encounter for immunization: Secondary | ICD-10-CM | POA: Diagnosis not present

## 2018-02-21 DIAGNOSIS — D631 Anemia in chronic kidney disease: Secondary | ICD-10-CM | POA: Diagnosis not present

## 2018-02-21 DIAGNOSIS — Z743 Need for continuous supervision: Secondary | ICD-10-CM | POA: Diagnosis not present

## 2018-02-21 DIAGNOSIS — N186 End stage renal disease: Secondary | ICD-10-CM | POA: Diagnosis not present

## 2018-02-21 DIAGNOSIS — D509 Iron deficiency anemia, unspecified: Secondary | ICD-10-CM | POA: Diagnosis not present

## 2018-02-21 DIAGNOSIS — Z418 Encounter for other procedures for purposes other than remedying health state: Secondary | ICD-10-CM | POA: Diagnosis not present

## 2018-02-21 DIAGNOSIS — N2581 Secondary hyperparathyroidism of renal origin: Secondary | ICD-10-CM | POA: Diagnosis not present

## 2018-02-21 DIAGNOSIS — R279 Unspecified lack of coordination: Secondary | ICD-10-CM | POA: Diagnosis not present

## 2018-02-24 DIAGNOSIS — N186 End stage renal disease: Secondary | ICD-10-CM | POA: Diagnosis not present

## 2018-02-24 DIAGNOSIS — E119 Type 2 diabetes mellitus without complications: Secondary | ICD-10-CM | POA: Diagnosis not present

## 2018-02-24 DIAGNOSIS — D631 Anemia in chronic kidney disease: Secondary | ICD-10-CM | POA: Diagnosis not present

## 2018-02-24 DIAGNOSIS — I69351 Hemiplegia and hemiparesis following cerebral infarction affecting right dominant side: Secondary | ICD-10-CM | POA: Diagnosis not present

## 2018-02-24 DIAGNOSIS — N2581 Secondary hyperparathyroidism of renal origin: Secondary | ICD-10-CM | POA: Diagnosis not present

## 2018-02-24 DIAGNOSIS — Z7401 Bed confinement status: Secondary | ICD-10-CM | POA: Diagnosis not present

## 2018-02-24 DIAGNOSIS — M2550 Pain in unspecified joint: Secondary | ICD-10-CM | POA: Diagnosis not present

## 2018-02-24 DIAGNOSIS — D509 Iron deficiency anemia, unspecified: Secondary | ICD-10-CM | POA: Diagnosis not present

## 2018-02-24 DIAGNOSIS — Z23 Encounter for immunization: Secondary | ICD-10-CM | POA: Diagnosis not present

## 2018-02-24 DIAGNOSIS — Z418 Encounter for other procedures for purposes other than remedying health state: Secondary | ICD-10-CM | POA: Diagnosis not present

## 2018-02-26 DIAGNOSIS — D509 Iron deficiency anemia, unspecified: Secondary | ICD-10-CM | POA: Diagnosis not present

## 2018-02-26 DIAGNOSIS — N2581 Secondary hyperparathyroidism of renal origin: Secondary | ICD-10-CM | POA: Diagnosis not present

## 2018-02-26 DIAGNOSIS — Z23 Encounter for immunization: Secondary | ICD-10-CM | POA: Diagnosis not present

## 2018-02-26 DIAGNOSIS — N186 End stage renal disease: Secondary | ICD-10-CM | POA: Diagnosis not present

## 2018-02-26 DIAGNOSIS — Z418 Encounter for other procedures for purposes other than remedying health state: Secondary | ICD-10-CM | POA: Diagnosis not present

## 2018-02-26 DIAGNOSIS — D631 Anemia in chronic kidney disease: Secondary | ICD-10-CM | POA: Diagnosis not present

## 2018-02-28 DIAGNOSIS — D509 Iron deficiency anemia, unspecified: Secondary | ICD-10-CM | POA: Diagnosis not present

## 2018-02-28 DIAGNOSIS — R4781 Slurred speech: Secondary | ICD-10-CM | POA: Diagnosis not present

## 2018-02-28 DIAGNOSIS — I959 Hypotension, unspecified: Secondary | ICD-10-CM | POA: Diagnosis not present

## 2018-02-28 DIAGNOSIS — R5381 Other malaise: Secondary | ICD-10-CM | POA: Diagnosis not present

## 2018-02-28 DIAGNOSIS — N2581 Secondary hyperparathyroidism of renal origin: Secondary | ICD-10-CM | POA: Diagnosis not present

## 2018-02-28 DIAGNOSIS — R1111 Vomiting without nausea: Secondary | ICD-10-CM | POA: Diagnosis not present

## 2018-02-28 DIAGNOSIS — R0902 Hypoxemia: Secondary | ICD-10-CM | POA: Diagnosis not present

## 2018-02-28 DIAGNOSIS — Z418 Encounter for other procedures for purposes other than remedying health state: Secondary | ICD-10-CM | POA: Diagnosis not present

## 2018-02-28 DIAGNOSIS — E669 Obesity, unspecified: Secondary | ICD-10-CM | POA: Diagnosis not present

## 2018-02-28 DIAGNOSIS — Z23 Encounter for immunization: Secondary | ICD-10-CM | POA: Diagnosis not present

## 2018-02-28 DIAGNOSIS — K000 Anodontia: Secondary | ICD-10-CM | POA: Diagnosis not present

## 2018-02-28 DIAGNOSIS — I120 Hypertensive chronic kidney disease with stage 5 chronic kidney disease or end stage renal disease: Secondary | ICD-10-CM | POA: Diagnosis not present

## 2018-02-28 DIAGNOSIS — D631 Anemia in chronic kidney disease: Secondary | ICD-10-CM | POA: Diagnosis not present

## 2018-02-28 DIAGNOSIS — R112 Nausea with vomiting, unspecified: Secondary | ICD-10-CM | POA: Diagnosis not present

## 2018-02-28 DIAGNOSIS — Z7401 Bed confinement status: Secondary | ICD-10-CM | POA: Diagnosis not present

## 2018-02-28 DIAGNOSIS — M2550 Pain in unspecified joint: Secondary | ICD-10-CM | POA: Diagnosis not present

## 2018-02-28 DIAGNOSIS — N186 End stage renal disease: Secondary | ICD-10-CM | POA: Diagnosis not present

## 2018-03-03 DIAGNOSIS — N186 End stage renal disease: Secondary | ICD-10-CM | POA: Diagnosis not present

## 2018-03-03 DIAGNOSIS — D509 Iron deficiency anemia, unspecified: Secondary | ICD-10-CM | POA: Diagnosis not present

## 2018-03-03 DIAGNOSIS — N2581 Secondary hyperparathyroidism of renal origin: Secondary | ICD-10-CM | POA: Diagnosis not present

## 2018-03-03 DIAGNOSIS — D631 Anemia in chronic kidney disease: Secondary | ICD-10-CM | POA: Diagnosis not present

## 2018-03-03 DIAGNOSIS — O479 False labor, unspecified: Secondary | ICD-10-CM | POA: Diagnosis not present

## 2018-03-03 DIAGNOSIS — Z418 Encounter for other procedures for purposes other than remedying health state: Secondary | ICD-10-CM | POA: Diagnosis not present

## 2018-03-03 DIAGNOSIS — Z23 Encounter for immunization: Secondary | ICD-10-CM | POA: Diagnosis not present

## 2018-03-03 DIAGNOSIS — Z7401 Bed confinement status: Secondary | ICD-10-CM | POA: Diagnosis not present

## 2018-03-03 DIAGNOSIS — M2550 Pain in unspecified joint: Secondary | ICD-10-CM | POA: Diagnosis not present

## 2018-03-05 DIAGNOSIS — D631 Anemia in chronic kidney disease: Secondary | ICD-10-CM | POA: Diagnosis not present

## 2018-03-05 DIAGNOSIS — Z418 Encounter for other procedures for purposes other than remedying health state: Secondary | ICD-10-CM | POA: Diagnosis not present

## 2018-03-05 DIAGNOSIS — N186 End stage renal disease: Secondary | ICD-10-CM | POA: Diagnosis not present

## 2018-03-05 DIAGNOSIS — Z23 Encounter for immunization: Secondary | ICD-10-CM | POA: Diagnosis not present

## 2018-03-05 DIAGNOSIS — D509 Iron deficiency anemia, unspecified: Secondary | ICD-10-CM | POA: Diagnosis not present

## 2018-03-05 DIAGNOSIS — N2581 Secondary hyperparathyroidism of renal origin: Secondary | ICD-10-CM | POA: Diagnosis not present

## 2018-03-06 DIAGNOSIS — I69351 Hemiplegia and hemiparesis following cerebral infarction affecting right dominant side: Secondary | ICD-10-CM | POA: Diagnosis not present

## 2018-03-06 DIAGNOSIS — N186 End stage renal disease: Secondary | ICD-10-CM | POA: Diagnosis not present

## 2018-03-06 DIAGNOSIS — E1122 Type 2 diabetes mellitus with diabetic chronic kidney disease: Secondary | ICD-10-CM | POA: Diagnosis not present

## 2018-03-07 DIAGNOSIS — N186 End stage renal disease: Secondary | ICD-10-CM | POA: Diagnosis not present

## 2018-03-07 DIAGNOSIS — D509 Iron deficiency anemia, unspecified: Secondary | ICD-10-CM | POA: Diagnosis not present

## 2018-03-07 DIAGNOSIS — Z23 Encounter for immunization: Secondary | ICD-10-CM | POA: Diagnosis not present

## 2018-03-07 DIAGNOSIS — N2581 Secondary hyperparathyroidism of renal origin: Secondary | ICD-10-CM | POA: Diagnosis not present

## 2018-03-07 DIAGNOSIS — D631 Anemia in chronic kidney disease: Secondary | ICD-10-CM | POA: Diagnosis not present

## 2018-03-07 DIAGNOSIS — Z418 Encounter for other procedures for purposes other than remedying health state: Secondary | ICD-10-CM | POA: Diagnosis not present

## 2018-03-10 DIAGNOSIS — N2581 Secondary hyperparathyroidism of renal origin: Secondary | ICD-10-CM | POA: Diagnosis not present

## 2018-03-10 DIAGNOSIS — D631 Anemia in chronic kidney disease: Secondary | ICD-10-CM | POA: Diagnosis not present

## 2018-03-10 DIAGNOSIS — D509 Iron deficiency anemia, unspecified: Secondary | ICD-10-CM | POA: Diagnosis not present

## 2018-03-10 DIAGNOSIS — Z418 Encounter for other procedures for purposes other than remedying health state: Secondary | ICD-10-CM | POA: Diagnosis not present

## 2018-03-10 DIAGNOSIS — Z23 Encounter for immunization: Secondary | ICD-10-CM | POA: Diagnosis not present

## 2018-03-10 DIAGNOSIS — N186 End stage renal disease: Secondary | ICD-10-CM | POA: Diagnosis not present

## 2018-03-11 DIAGNOSIS — N186 End stage renal disease: Secondary | ICD-10-CM | POA: Diagnosis not present

## 2018-03-11 DIAGNOSIS — D631 Anemia in chronic kidney disease: Secondary | ICD-10-CM | POA: Diagnosis not present

## 2018-03-11 DIAGNOSIS — I10 Essential (primary) hypertension: Secondary | ICD-10-CM | POA: Diagnosis not present

## 2018-03-12 DIAGNOSIS — Z418 Encounter for other procedures for purposes other than remedying health state: Secondary | ICD-10-CM | POA: Diagnosis not present

## 2018-03-12 DIAGNOSIS — D631 Anemia in chronic kidney disease: Secondary | ICD-10-CM | POA: Diagnosis not present

## 2018-03-12 DIAGNOSIS — Z23 Encounter for immunization: Secondary | ICD-10-CM | POA: Diagnosis not present

## 2018-03-12 DIAGNOSIS — N186 End stage renal disease: Secondary | ICD-10-CM | POA: Diagnosis not present

## 2018-03-12 DIAGNOSIS — N2581 Secondary hyperparathyroidism of renal origin: Secondary | ICD-10-CM | POA: Diagnosis not present

## 2018-03-12 DIAGNOSIS — I10 Essential (primary) hypertension: Secondary | ICD-10-CM | POA: Diagnosis not present

## 2018-03-14 DIAGNOSIS — I10 Essential (primary) hypertension: Secondary | ICD-10-CM | POA: Diagnosis not present

## 2018-03-14 DIAGNOSIS — Z23 Encounter for immunization: Secondary | ICD-10-CM | POA: Diagnosis not present

## 2018-03-14 DIAGNOSIS — N2581 Secondary hyperparathyroidism of renal origin: Secondary | ICD-10-CM | POA: Diagnosis not present

## 2018-03-14 DIAGNOSIS — Z418 Encounter for other procedures for purposes other than remedying health state: Secondary | ICD-10-CM | POA: Diagnosis not present

## 2018-03-14 DIAGNOSIS — D631 Anemia in chronic kidney disease: Secondary | ICD-10-CM | POA: Diagnosis not present

## 2018-03-14 DIAGNOSIS — N186 End stage renal disease: Secondary | ICD-10-CM | POA: Diagnosis not present

## 2018-03-17 DIAGNOSIS — N2581 Secondary hyperparathyroidism of renal origin: Secondary | ICD-10-CM | POA: Diagnosis not present

## 2018-03-17 DIAGNOSIS — N186 End stage renal disease: Secondary | ICD-10-CM | POA: Diagnosis not present

## 2018-03-17 DIAGNOSIS — Z23 Encounter for immunization: Secondary | ICD-10-CM | POA: Diagnosis not present

## 2018-03-17 DIAGNOSIS — D631 Anemia in chronic kidney disease: Secondary | ICD-10-CM | POA: Diagnosis not present

## 2018-03-17 DIAGNOSIS — I10 Essential (primary) hypertension: Secondary | ICD-10-CM | POA: Diagnosis not present

## 2018-03-17 DIAGNOSIS — Z418 Encounter for other procedures for purposes other than remedying health state: Secondary | ICD-10-CM | POA: Diagnosis not present

## 2018-03-19 DIAGNOSIS — D631 Anemia in chronic kidney disease: Secondary | ICD-10-CM | POA: Diagnosis not present

## 2018-03-19 DIAGNOSIS — Z23 Encounter for immunization: Secondary | ICD-10-CM | POA: Diagnosis not present

## 2018-03-19 DIAGNOSIS — I10 Essential (primary) hypertension: Secondary | ICD-10-CM | POA: Diagnosis not present

## 2018-03-19 DIAGNOSIS — N186 End stage renal disease: Secondary | ICD-10-CM | POA: Diagnosis not present

## 2018-03-19 DIAGNOSIS — N2581 Secondary hyperparathyroidism of renal origin: Secondary | ICD-10-CM | POA: Diagnosis not present

## 2018-03-19 DIAGNOSIS — Z418 Encounter for other procedures for purposes other than remedying health state: Secondary | ICD-10-CM | POA: Diagnosis not present

## 2018-03-21 DIAGNOSIS — D631 Anemia in chronic kidney disease: Secondary | ICD-10-CM | POA: Diagnosis not present

## 2018-03-21 DIAGNOSIS — E875 Hyperkalemia: Secondary | ICD-10-CM | POA: Diagnosis not present

## 2018-03-21 DIAGNOSIS — K5900 Constipation, unspecified: Secondary | ICD-10-CM | POA: Diagnosis not present

## 2018-03-21 DIAGNOSIS — Z992 Dependence on renal dialysis: Secondary | ICD-10-CM | POA: Diagnosis not present

## 2018-03-21 DIAGNOSIS — Z418 Encounter for other procedures for purposes other than remedying health state: Secondary | ICD-10-CM | POA: Diagnosis not present

## 2018-03-21 DIAGNOSIS — E1122 Type 2 diabetes mellitus with diabetic chronic kidney disease: Secondary | ICD-10-CM | POA: Diagnosis not present

## 2018-03-21 DIAGNOSIS — H548 Legal blindness, as defined in USA: Secondary | ICD-10-CM | POA: Diagnosis not present

## 2018-03-21 DIAGNOSIS — F339 Major depressive disorder, recurrent, unspecified: Secondary | ICD-10-CM | POA: Diagnosis not present

## 2018-03-21 DIAGNOSIS — I69954 Hemiplegia and hemiparesis following unspecified cerebrovascular disease affecting left non-dominant side: Secondary | ICD-10-CM | POA: Diagnosis not present

## 2018-03-21 DIAGNOSIS — M24542 Contracture, left hand: Secondary | ICD-10-CM | POA: Diagnosis not present

## 2018-03-21 DIAGNOSIS — Z23 Encounter for immunization: Secondary | ICD-10-CM | POA: Diagnosis not present

## 2018-03-21 DIAGNOSIS — E559 Vitamin D deficiency, unspecified: Secondary | ICD-10-CM | POA: Diagnosis not present

## 2018-03-21 DIAGNOSIS — R1312 Dysphagia, oropharyngeal phase: Secondary | ICD-10-CM | POA: Diagnosis not present

## 2018-03-21 DIAGNOSIS — I429 Cardiomyopathy, unspecified: Secondary | ICD-10-CM | POA: Diagnosis not present

## 2018-03-21 DIAGNOSIS — I639 Cerebral infarction, unspecified: Secondary | ICD-10-CM | POA: Diagnosis not present

## 2018-03-21 DIAGNOSIS — I10 Essential (primary) hypertension: Secondary | ICD-10-CM | POA: Diagnosis not present

## 2018-03-21 DIAGNOSIS — M24522 Contracture, left elbow: Secondary | ICD-10-CM | POA: Diagnosis not present

## 2018-03-21 DIAGNOSIS — I482 Chronic atrial fibrillation, unspecified: Secondary | ICD-10-CM | POA: Diagnosis not present

## 2018-03-21 DIAGNOSIS — Z89511 Acquired absence of right leg below knee: Secondary | ICD-10-CM | POA: Diagnosis not present

## 2018-03-21 DIAGNOSIS — E785 Hyperlipidemia, unspecified: Secondary | ICD-10-CM | POA: Diagnosis not present

## 2018-03-21 DIAGNOSIS — I739 Peripheral vascular disease, unspecified: Secondary | ICD-10-CM | POA: Diagnosis not present

## 2018-03-21 DIAGNOSIS — N186 End stage renal disease: Secondary | ICD-10-CM | POA: Diagnosis not present

## 2018-03-21 DIAGNOSIS — I509 Heart failure, unspecified: Secondary | ICD-10-CM | POA: Diagnosis not present

## 2018-03-21 DIAGNOSIS — Z89612 Acquired absence of left leg above knee: Secondary | ICD-10-CM | POA: Diagnosis not present

## 2018-03-21 DIAGNOSIS — R569 Unspecified convulsions: Secondary | ICD-10-CM | POA: Diagnosis not present

## 2018-03-21 DIAGNOSIS — E1169 Type 2 diabetes mellitus with other specified complication: Secondary | ICD-10-CM | POA: Diagnosis not present

## 2018-03-21 DIAGNOSIS — N2581 Secondary hyperparathyroidism of renal origin: Secondary | ICD-10-CM | POA: Diagnosis not present

## 2018-03-24 DIAGNOSIS — N186 End stage renal disease: Secondary | ICD-10-CM | POA: Diagnosis not present

## 2018-03-24 DIAGNOSIS — I69351 Hemiplegia and hemiparesis following cerebral infarction affecting right dominant side: Secondary | ICD-10-CM | POA: Diagnosis not present

## 2018-03-24 DIAGNOSIS — R1312 Dysphagia, oropharyngeal phase: Secondary | ICD-10-CM | POA: Diagnosis not present

## 2018-03-24 DIAGNOSIS — E119 Type 2 diabetes mellitus without complications: Secondary | ICD-10-CM | POA: Diagnosis not present

## 2018-03-24 DIAGNOSIS — Z418 Encounter for other procedures for purposes other than remedying health state: Secondary | ICD-10-CM | POA: Diagnosis not present

## 2018-03-24 DIAGNOSIS — M24542 Contracture, left hand: Secondary | ICD-10-CM | POA: Diagnosis not present

## 2018-03-24 DIAGNOSIS — N2581 Secondary hyperparathyroidism of renal origin: Secondary | ICD-10-CM | POA: Diagnosis not present

## 2018-03-24 DIAGNOSIS — Z23 Encounter for immunization: Secondary | ICD-10-CM | POA: Diagnosis not present

## 2018-03-24 DIAGNOSIS — I10 Essential (primary) hypertension: Secondary | ICD-10-CM | POA: Diagnosis not present

## 2018-03-24 DIAGNOSIS — D631 Anemia in chronic kidney disease: Secondary | ICD-10-CM | POA: Diagnosis not present

## 2018-03-24 DIAGNOSIS — A4102 Sepsis due to Methicillin resistant Staphylococcus aureus: Secondary | ICD-10-CM | POA: Diagnosis not present

## 2018-03-24 DIAGNOSIS — I69954 Hemiplegia and hemiparesis following unspecified cerebrovascular disease affecting left non-dominant side: Secondary | ICD-10-CM | POA: Diagnosis not present

## 2018-03-24 DIAGNOSIS — I639 Cerebral infarction, unspecified: Secondary | ICD-10-CM | POA: Diagnosis not present

## 2018-03-24 DIAGNOSIS — M24522 Contracture, left elbow: Secondary | ICD-10-CM | POA: Diagnosis not present

## 2018-03-24 DIAGNOSIS — F339 Major depressive disorder, recurrent, unspecified: Secondary | ICD-10-CM | POA: Diagnosis not present

## 2018-03-26 DIAGNOSIS — Z418 Encounter for other procedures for purposes other than remedying health state: Secondary | ICD-10-CM | POA: Diagnosis not present

## 2018-03-26 DIAGNOSIS — N186 End stage renal disease: Secondary | ICD-10-CM | POA: Diagnosis not present

## 2018-03-26 DIAGNOSIS — R1312 Dysphagia, oropharyngeal phase: Secondary | ICD-10-CM | POA: Diagnosis not present

## 2018-03-26 DIAGNOSIS — I69954 Hemiplegia and hemiparesis following unspecified cerebrovascular disease affecting left non-dominant side: Secondary | ICD-10-CM | POA: Diagnosis not present

## 2018-03-26 DIAGNOSIS — I10 Essential (primary) hypertension: Secondary | ICD-10-CM | POA: Diagnosis not present

## 2018-03-26 DIAGNOSIS — Z23 Encounter for immunization: Secondary | ICD-10-CM | POA: Diagnosis not present

## 2018-03-26 DIAGNOSIS — N2581 Secondary hyperparathyroidism of renal origin: Secondary | ICD-10-CM | POA: Diagnosis not present

## 2018-03-26 DIAGNOSIS — D631 Anemia in chronic kidney disease: Secondary | ICD-10-CM | POA: Diagnosis not present

## 2018-03-26 DIAGNOSIS — M24522 Contracture, left elbow: Secondary | ICD-10-CM | POA: Diagnosis not present

## 2018-03-26 DIAGNOSIS — M24542 Contracture, left hand: Secondary | ICD-10-CM | POA: Diagnosis not present

## 2018-03-26 DIAGNOSIS — I639 Cerebral infarction, unspecified: Secondary | ICD-10-CM | POA: Diagnosis not present

## 2018-03-26 DIAGNOSIS — F339 Major depressive disorder, recurrent, unspecified: Secondary | ICD-10-CM | POA: Diagnosis not present

## 2018-03-27 DIAGNOSIS — I69954 Hemiplegia and hemiparesis following unspecified cerebrovascular disease affecting left non-dominant side: Secondary | ICD-10-CM | POA: Diagnosis not present

## 2018-03-27 DIAGNOSIS — M24522 Contracture, left elbow: Secondary | ICD-10-CM | POA: Diagnosis not present

## 2018-03-27 DIAGNOSIS — R1312 Dysphagia, oropharyngeal phase: Secondary | ICD-10-CM | POA: Diagnosis not present

## 2018-03-27 DIAGNOSIS — I639 Cerebral infarction, unspecified: Secondary | ICD-10-CM | POA: Diagnosis not present

## 2018-03-27 DIAGNOSIS — F339 Major depressive disorder, recurrent, unspecified: Secondary | ICD-10-CM | POA: Diagnosis not present

## 2018-03-27 DIAGNOSIS — M24542 Contracture, left hand: Secondary | ICD-10-CM | POA: Diagnosis not present

## 2018-03-28 DIAGNOSIS — M24542 Contracture, left hand: Secondary | ICD-10-CM | POA: Diagnosis not present

## 2018-03-28 DIAGNOSIS — Z23 Encounter for immunization: Secondary | ICD-10-CM | POA: Diagnosis not present

## 2018-03-28 DIAGNOSIS — D631 Anemia in chronic kidney disease: Secondary | ICD-10-CM | POA: Diagnosis not present

## 2018-03-28 DIAGNOSIS — M24522 Contracture, left elbow: Secondary | ICD-10-CM | POA: Diagnosis not present

## 2018-03-28 DIAGNOSIS — F339 Major depressive disorder, recurrent, unspecified: Secondary | ICD-10-CM | POA: Diagnosis not present

## 2018-03-28 DIAGNOSIS — R1312 Dysphagia, oropharyngeal phase: Secondary | ICD-10-CM | POA: Diagnosis not present

## 2018-03-28 DIAGNOSIS — N2581 Secondary hyperparathyroidism of renal origin: Secondary | ICD-10-CM | POA: Diagnosis not present

## 2018-03-28 DIAGNOSIS — I69954 Hemiplegia and hemiparesis following unspecified cerebrovascular disease affecting left non-dominant side: Secondary | ICD-10-CM | POA: Diagnosis not present

## 2018-03-28 DIAGNOSIS — Z418 Encounter for other procedures for purposes other than remedying health state: Secondary | ICD-10-CM | POA: Diagnosis not present

## 2018-03-28 DIAGNOSIS — I639 Cerebral infarction, unspecified: Secondary | ICD-10-CM | POA: Diagnosis not present

## 2018-03-28 DIAGNOSIS — N186 End stage renal disease: Secondary | ICD-10-CM | POA: Diagnosis not present

## 2018-03-28 DIAGNOSIS — I10 Essential (primary) hypertension: Secondary | ICD-10-CM | POA: Diagnosis not present

## 2018-03-31 DIAGNOSIS — Z23 Encounter for immunization: Secondary | ICD-10-CM | POA: Diagnosis not present

## 2018-03-31 DIAGNOSIS — Z418 Encounter for other procedures for purposes other than remedying health state: Secondary | ICD-10-CM | POA: Diagnosis not present

## 2018-03-31 DIAGNOSIS — N186 End stage renal disease: Secondary | ICD-10-CM | POA: Diagnosis not present

## 2018-03-31 DIAGNOSIS — R1312 Dysphagia, oropharyngeal phase: Secondary | ICD-10-CM | POA: Diagnosis not present

## 2018-03-31 DIAGNOSIS — I69954 Hemiplegia and hemiparesis following unspecified cerebrovascular disease affecting left non-dominant side: Secondary | ICD-10-CM | POA: Diagnosis not present

## 2018-03-31 DIAGNOSIS — I639 Cerebral infarction, unspecified: Secondary | ICD-10-CM | POA: Diagnosis not present

## 2018-03-31 DIAGNOSIS — D631 Anemia in chronic kidney disease: Secondary | ICD-10-CM | POA: Diagnosis not present

## 2018-03-31 DIAGNOSIS — N2581 Secondary hyperparathyroidism of renal origin: Secondary | ICD-10-CM | POA: Diagnosis not present

## 2018-03-31 DIAGNOSIS — M24542 Contracture, left hand: Secondary | ICD-10-CM | POA: Diagnosis not present

## 2018-03-31 DIAGNOSIS — M24522 Contracture, left elbow: Secondary | ICD-10-CM | POA: Diagnosis not present

## 2018-03-31 DIAGNOSIS — I10 Essential (primary) hypertension: Secondary | ICD-10-CM | POA: Diagnosis not present

## 2018-03-31 DIAGNOSIS — F339 Major depressive disorder, recurrent, unspecified: Secondary | ICD-10-CM | POA: Diagnosis not present

## 2018-04-01 DIAGNOSIS — I639 Cerebral infarction, unspecified: Secondary | ICD-10-CM | POA: Diagnosis not present

## 2018-04-01 DIAGNOSIS — I69954 Hemiplegia and hemiparesis following unspecified cerebrovascular disease affecting left non-dominant side: Secondary | ICD-10-CM | POA: Diagnosis not present

## 2018-04-01 DIAGNOSIS — M24542 Contracture, left hand: Secondary | ICD-10-CM | POA: Diagnosis not present

## 2018-04-01 DIAGNOSIS — M24522 Contracture, left elbow: Secondary | ICD-10-CM | POA: Diagnosis not present

## 2018-04-01 DIAGNOSIS — R1312 Dysphagia, oropharyngeal phase: Secondary | ICD-10-CM | POA: Diagnosis not present

## 2018-04-01 DIAGNOSIS — F339 Major depressive disorder, recurrent, unspecified: Secondary | ICD-10-CM | POA: Diagnosis not present

## 2018-04-02 DIAGNOSIS — N186 End stage renal disease: Secondary | ICD-10-CM | POA: Diagnosis not present

## 2018-04-02 DIAGNOSIS — I10 Essential (primary) hypertension: Secondary | ICD-10-CM | POA: Diagnosis not present

## 2018-04-02 DIAGNOSIS — Z23 Encounter for immunization: Secondary | ICD-10-CM | POA: Diagnosis not present

## 2018-04-02 DIAGNOSIS — R1312 Dysphagia, oropharyngeal phase: Secondary | ICD-10-CM | POA: Diagnosis not present

## 2018-04-02 DIAGNOSIS — I69954 Hemiplegia and hemiparesis following unspecified cerebrovascular disease affecting left non-dominant side: Secondary | ICD-10-CM | POA: Diagnosis not present

## 2018-04-02 DIAGNOSIS — Z418 Encounter for other procedures for purposes other than remedying health state: Secondary | ICD-10-CM | POA: Diagnosis not present

## 2018-04-02 DIAGNOSIS — M24542 Contracture, left hand: Secondary | ICD-10-CM | POA: Diagnosis not present

## 2018-04-02 DIAGNOSIS — M24522 Contracture, left elbow: Secondary | ICD-10-CM | POA: Diagnosis not present

## 2018-04-02 DIAGNOSIS — I639 Cerebral infarction, unspecified: Secondary | ICD-10-CM | POA: Diagnosis not present

## 2018-04-02 DIAGNOSIS — D631 Anemia in chronic kidney disease: Secondary | ICD-10-CM | POA: Diagnosis not present

## 2018-04-02 DIAGNOSIS — N2581 Secondary hyperparathyroidism of renal origin: Secondary | ICD-10-CM | POA: Diagnosis not present

## 2018-04-02 DIAGNOSIS — F339 Major depressive disorder, recurrent, unspecified: Secondary | ICD-10-CM | POA: Diagnosis not present

## 2018-04-03 DIAGNOSIS — M24542 Contracture, left hand: Secondary | ICD-10-CM | POA: Diagnosis not present

## 2018-04-03 DIAGNOSIS — R1312 Dysphagia, oropharyngeal phase: Secondary | ICD-10-CM | POA: Diagnosis not present

## 2018-04-03 DIAGNOSIS — N186 End stage renal disease: Secondary | ICD-10-CM | POA: Diagnosis not present

## 2018-04-03 DIAGNOSIS — F339 Major depressive disorder, recurrent, unspecified: Secondary | ICD-10-CM | POA: Diagnosis not present

## 2018-04-03 DIAGNOSIS — I639 Cerebral infarction, unspecified: Secondary | ICD-10-CM | POA: Diagnosis not present

## 2018-04-03 DIAGNOSIS — I5022 Chronic systolic (congestive) heart failure: Secondary | ICD-10-CM | POA: Diagnosis not present

## 2018-04-03 DIAGNOSIS — E1122 Type 2 diabetes mellitus with diabetic chronic kidney disease: Secondary | ICD-10-CM | POA: Diagnosis not present

## 2018-04-03 DIAGNOSIS — G40909 Epilepsy, unspecified, not intractable, without status epilepticus: Secondary | ICD-10-CM | POA: Diagnosis not present

## 2018-04-03 DIAGNOSIS — M24522 Contracture, left elbow: Secondary | ICD-10-CM | POA: Diagnosis not present

## 2018-04-03 DIAGNOSIS — I69954 Hemiplegia and hemiparesis following unspecified cerebrovascular disease affecting left non-dominant side: Secondary | ICD-10-CM | POA: Diagnosis not present

## 2018-04-04 DIAGNOSIS — N2581 Secondary hyperparathyroidism of renal origin: Secondary | ICD-10-CM | POA: Diagnosis not present

## 2018-04-04 DIAGNOSIS — Z418 Encounter for other procedures for purposes other than remedying health state: Secondary | ICD-10-CM | POA: Diagnosis not present

## 2018-04-04 DIAGNOSIS — F339 Major depressive disorder, recurrent, unspecified: Secondary | ICD-10-CM | POA: Diagnosis not present

## 2018-04-04 DIAGNOSIS — M24542 Contracture, left hand: Secondary | ICD-10-CM | POA: Diagnosis not present

## 2018-04-04 DIAGNOSIS — I10 Essential (primary) hypertension: Secondary | ICD-10-CM | POA: Diagnosis not present

## 2018-04-04 DIAGNOSIS — D631 Anemia in chronic kidney disease: Secondary | ICD-10-CM | POA: Diagnosis not present

## 2018-04-04 DIAGNOSIS — I69954 Hemiplegia and hemiparesis following unspecified cerebrovascular disease affecting left non-dominant side: Secondary | ICD-10-CM | POA: Diagnosis not present

## 2018-04-04 DIAGNOSIS — I639 Cerebral infarction, unspecified: Secondary | ICD-10-CM | POA: Diagnosis not present

## 2018-04-04 DIAGNOSIS — N186 End stage renal disease: Secondary | ICD-10-CM | POA: Diagnosis not present

## 2018-04-04 DIAGNOSIS — Z23 Encounter for immunization: Secondary | ICD-10-CM | POA: Diagnosis not present

## 2018-04-04 DIAGNOSIS — M24522 Contracture, left elbow: Secondary | ICD-10-CM | POA: Diagnosis not present

## 2018-04-04 DIAGNOSIS — R1312 Dysphagia, oropharyngeal phase: Secondary | ICD-10-CM | POA: Diagnosis not present

## 2018-04-05 DIAGNOSIS — M24542 Contracture, left hand: Secondary | ICD-10-CM | POA: Diagnosis not present

## 2018-04-05 DIAGNOSIS — I639 Cerebral infarction, unspecified: Secondary | ICD-10-CM | POA: Diagnosis not present

## 2018-04-05 DIAGNOSIS — I69954 Hemiplegia and hemiparesis following unspecified cerebrovascular disease affecting left non-dominant side: Secondary | ICD-10-CM | POA: Diagnosis not present

## 2018-04-05 DIAGNOSIS — R1312 Dysphagia, oropharyngeal phase: Secondary | ICD-10-CM | POA: Diagnosis not present

## 2018-04-05 DIAGNOSIS — F339 Major depressive disorder, recurrent, unspecified: Secondary | ICD-10-CM | POA: Diagnosis not present

## 2018-04-05 DIAGNOSIS — M24522 Contracture, left elbow: Secondary | ICD-10-CM | POA: Diagnosis not present

## 2018-04-07 DIAGNOSIS — I10 Essential (primary) hypertension: Secondary | ICD-10-CM | POA: Diagnosis not present

## 2018-04-07 DIAGNOSIS — N2581 Secondary hyperparathyroidism of renal origin: Secondary | ICD-10-CM | POA: Diagnosis not present

## 2018-04-07 DIAGNOSIS — Z23 Encounter for immunization: Secondary | ICD-10-CM | POA: Diagnosis not present

## 2018-04-07 DIAGNOSIS — M2550 Pain in unspecified joint: Secondary | ICD-10-CM | POA: Diagnosis not present

## 2018-04-07 DIAGNOSIS — Z7401 Bed confinement status: Secondary | ICD-10-CM | POA: Diagnosis not present

## 2018-04-07 DIAGNOSIS — N186 End stage renal disease: Secondary | ICD-10-CM | POA: Diagnosis not present

## 2018-04-07 DIAGNOSIS — D631 Anemia in chronic kidney disease: Secondary | ICD-10-CM | POA: Diagnosis not present

## 2018-04-07 DIAGNOSIS — Z418 Encounter for other procedures for purposes other than remedying health state: Secondary | ICD-10-CM | POA: Diagnosis not present

## 2018-04-08 DIAGNOSIS — I639 Cerebral infarction, unspecified: Secondary | ICD-10-CM | POA: Diagnosis not present

## 2018-04-08 DIAGNOSIS — M24542 Contracture, left hand: Secondary | ICD-10-CM | POA: Diagnosis not present

## 2018-04-08 DIAGNOSIS — F339 Major depressive disorder, recurrent, unspecified: Secondary | ICD-10-CM | POA: Diagnosis not present

## 2018-04-08 DIAGNOSIS — M24522 Contracture, left elbow: Secondary | ICD-10-CM | POA: Diagnosis not present

## 2018-04-08 DIAGNOSIS — R1312 Dysphagia, oropharyngeal phase: Secondary | ICD-10-CM | POA: Diagnosis not present

## 2018-04-08 DIAGNOSIS — I69954 Hemiplegia and hemiparesis following unspecified cerebrovascular disease affecting left non-dominant side: Secondary | ICD-10-CM | POA: Diagnosis not present

## 2018-04-09 DIAGNOSIS — M24542 Contracture, left hand: Secondary | ICD-10-CM | POA: Diagnosis not present

## 2018-04-09 DIAGNOSIS — N2581 Secondary hyperparathyroidism of renal origin: Secondary | ICD-10-CM | POA: Diagnosis not present

## 2018-04-09 DIAGNOSIS — M24522 Contracture, left elbow: Secondary | ICD-10-CM | POA: Diagnosis not present

## 2018-04-09 DIAGNOSIS — D631 Anemia in chronic kidney disease: Secondary | ICD-10-CM | POA: Diagnosis not present

## 2018-04-09 DIAGNOSIS — Z418 Encounter for other procedures for purposes other than remedying health state: Secondary | ICD-10-CM | POA: Diagnosis not present

## 2018-04-09 DIAGNOSIS — F339 Major depressive disorder, recurrent, unspecified: Secondary | ICD-10-CM | POA: Diagnosis not present

## 2018-04-09 DIAGNOSIS — I639 Cerebral infarction, unspecified: Secondary | ICD-10-CM | POA: Diagnosis not present

## 2018-04-09 DIAGNOSIS — N186 End stage renal disease: Secondary | ICD-10-CM | POA: Diagnosis not present

## 2018-04-09 DIAGNOSIS — I10 Essential (primary) hypertension: Secondary | ICD-10-CM | POA: Diagnosis not present

## 2018-04-09 DIAGNOSIS — Z23 Encounter for immunization: Secondary | ICD-10-CM | POA: Diagnosis not present

## 2018-04-09 DIAGNOSIS — R1312 Dysphagia, oropharyngeal phase: Secondary | ICD-10-CM | POA: Diagnosis not present

## 2018-04-09 DIAGNOSIS — I69954 Hemiplegia and hemiparesis following unspecified cerebrovascular disease affecting left non-dominant side: Secondary | ICD-10-CM | POA: Diagnosis not present

## 2018-04-10 DIAGNOSIS — I639 Cerebral infarction, unspecified: Secondary | ICD-10-CM | POA: Diagnosis not present

## 2018-04-10 DIAGNOSIS — I69954 Hemiplegia and hemiparesis following unspecified cerebrovascular disease affecting left non-dominant side: Secondary | ICD-10-CM | POA: Diagnosis not present

## 2018-04-10 DIAGNOSIS — R1312 Dysphagia, oropharyngeal phase: Secondary | ICD-10-CM | POA: Diagnosis not present

## 2018-04-10 DIAGNOSIS — M24542 Contracture, left hand: Secondary | ICD-10-CM | POA: Diagnosis not present

## 2018-04-10 DIAGNOSIS — M24522 Contracture, left elbow: Secondary | ICD-10-CM | POA: Diagnosis not present

## 2018-04-10 DIAGNOSIS — F339 Major depressive disorder, recurrent, unspecified: Secondary | ICD-10-CM | POA: Diagnosis not present

## 2018-04-11 DIAGNOSIS — Z89612 Acquired absence of left leg above knee: Secondary | ICD-10-CM | POA: Diagnosis not present

## 2018-04-11 DIAGNOSIS — I69954 Hemiplegia and hemiparesis following unspecified cerebrovascular disease affecting left non-dominant side: Secondary | ICD-10-CM | POA: Diagnosis not present

## 2018-04-11 DIAGNOSIS — E1169 Type 2 diabetes mellitus with other specified complication: Secondary | ICD-10-CM | POA: Diagnosis not present

## 2018-04-11 DIAGNOSIS — K5900 Constipation, unspecified: Secondary | ICD-10-CM | POA: Diagnosis not present

## 2018-04-11 DIAGNOSIS — R569 Unspecified convulsions: Secondary | ICD-10-CM | POA: Diagnosis not present

## 2018-04-11 DIAGNOSIS — M24542 Contracture, left hand: Secondary | ICD-10-CM | POA: Diagnosis not present

## 2018-04-11 DIAGNOSIS — M24522 Contracture, left elbow: Secondary | ICD-10-CM | POA: Diagnosis not present

## 2018-04-11 DIAGNOSIS — Z418 Encounter for other procedures for purposes other than remedying health state: Secondary | ICD-10-CM | POA: Diagnosis not present

## 2018-04-11 DIAGNOSIS — N2581 Secondary hyperparathyroidism of renal origin: Secondary | ICD-10-CM | POA: Diagnosis not present

## 2018-04-11 DIAGNOSIS — N186 End stage renal disease: Secondary | ICD-10-CM | POA: Diagnosis not present

## 2018-04-11 DIAGNOSIS — D631 Anemia in chronic kidney disease: Secondary | ICD-10-CM | POA: Diagnosis not present

## 2018-04-11 DIAGNOSIS — I739 Peripheral vascular disease, unspecified: Secondary | ICD-10-CM | POA: Diagnosis not present

## 2018-04-11 DIAGNOSIS — I639 Cerebral infarction, unspecified: Secondary | ICD-10-CM | POA: Diagnosis not present

## 2018-04-11 DIAGNOSIS — I482 Chronic atrial fibrillation, unspecified: Secondary | ICD-10-CM | POA: Diagnosis not present

## 2018-04-11 DIAGNOSIS — E1122 Type 2 diabetes mellitus with diabetic chronic kidney disease: Secondary | ICD-10-CM | POA: Diagnosis not present

## 2018-04-11 DIAGNOSIS — I429 Cardiomyopathy, unspecified: Secondary | ICD-10-CM | POA: Diagnosis not present

## 2018-04-11 DIAGNOSIS — Z992 Dependence on renal dialysis: Secondary | ICD-10-CM | POA: Diagnosis not present

## 2018-04-11 DIAGNOSIS — Z89511 Acquired absence of right leg below knee: Secondary | ICD-10-CM | POA: Diagnosis not present

## 2018-04-11 DIAGNOSIS — E559 Vitamin D deficiency, unspecified: Secondary | ICD-10-CM | POA: Diagnosis not present

## 2018-04-11 DIAGNOSIS — I10 Essential (primary) hypertension: Secondary | ICD-10-CM | POA: Diagnosis not present

## 2018-04-11 DIAGNOSIS — H548 Legal blindness, as defined in USA: Secondary | ICD-10-CM | POA: Diagnosis not present

## 2018-04-11 DIAGNOSIS — F339 Major depressive disorder, recurrent, unspecified: Secondary | ICD-10-CM | POA: Diagnosis not present

## 2018-04-11 DIAGNOSIS — I509 Heart failure, unspecified: Secondary | ICD-10-CM | POA: Diagnosis not present

## 2018-04-11 DIAGNOSIS — E875 Hyperkalemia: Secondary | ICD-10-CM | POA: Diagnosis not present

## 2018-04-11 DIAGNOSIS — E785 Hyperlipidemia, unspecified: Secondary | ICD-10-CM | POA: Diagnosis not present

## 2018-04-11 DIAGNOSIS — T8249XA Other complication of vascular dialysis catheter, initial encounter: Secondary | ICD-10-CM | POA: Diagnosis not present

## 2018-04-11 DIAGNOSIS — R1312 Dysphagia, oropharyngeal phase: Secondary | ICD-10-CM | POA: Diagnosis not present

## 2018-04-14 DIAGNOSIS — I69954 Hemiplegia and hemiparesis following unspecified cerebrovascular disease affecting left non-dominant side: Secondary | ICD-10-CM | POA: Diagnosis not present

## 2018-04-14 DIAGNOSIS — D631 Anemia in chronic kidney disease: Secondary | ICD-10-CM | POA: Diagnosis not present

## 2018-04-14 DIAGNOSIS — I639 Cerebral infarction, unspecified: Secondary | ICD-10-CM | POA: Diagnosis not present

## 2018-04-14 DIAGNOSIS — R1312 Dysphagia, oropharyngeal phase: Secondary | ICD-10-CM | POA: Diagnosis not present

## 2018-04-14 DIAGNOSIS — M24522 Contracture, left elbow: Secondary | ICD-10-CM | POA: Diagnosis not present

## 2018-04-14 DIAGNOSIS — M24542 Contracture, left hand: Secondary | ICD-10-CM | POA: Diagnosis not present

## 2018-04-14 DIAGNOSIS — N2581 Secondary hyperparathyroidism of renal origin: Secondary | ICD-10-CM | POA: Diagnosis not present

## 2018-04-14 DIAGNOSIS — N186 End stage renal disease: Secondary | ICD-10-CM | POA: Diagnosis not present

## 2018-04-14 DIAGNOSIS — F339 Major depressive disorder, recurrent, unspecified: Secondary | ICD-10-CM | POA: Diagnosis not present

## 2018-04-14 DIAGNOSIS — T8249XA Other complication of vascular dialysis catheter, initial encounter: Secondary | ICD-10-CM | POA: Diagnosis not present

## 2018-04-14 DIAGNOSIS — Z418 Encounter for other procedures for purposes other than remedying health state: Secondary | ICD-10-CM | POA: Diagnosis not present

## 2018-04-15 DIAGNOSIS — M24522 Contracture, left elbow: Secondary | ICD-10-CM | POA: Diagnosis not present

## 2018-04-15 DIAGNOSIS — R1312 Dysphagia, oropharyngeal phase: Secondary | ICD-10-CM | POA: Diagnosis not present

## 2018-04-15 DIAGNOSIS — M24542 Contracture, left hand: Secondary | ICD-10-CM | POA: Diagnosis not present

## 2018-04-15 DIAGNOSIS — I639 Cerebral infarction, unspecified: Secondary | ICD-10-CM | POA: Diagnosis not present

## 2018-04-15 DIAGNOSIS — I69954 Hemiplegia and hemiparesis following unspecified cerebrovascular disease affecting left non-dominant side: Secondary | ICD-10-CM | POA: Diagnosis not present

## 2018-04-15 DIAGNOSIS — F339 Major depressive disorder, recurrent, unspecified: Secondary | ICD-10-CM | POA: Diagnosis not present

## 2018-04-16 DIAGNOSIS — M24542 Contracture, left hand: Secondary | ICD-10-CM | POA: Diagnosis not present

## 2018-04-16 DIAGNOSIS — N186 End stage renal disease: Secondary | ICD-10-CM | POA: Diagnosis not present

## 2018-04-16 DIAGNOSIS — I69954 Hemiplegia and hemiparesis following unspecified cerebrovascular disease affecting left non-dominant side: Secondary | ICD-10-CM | POA: Diagnosis not present

## 2018-04-16 DIAGNOSIS — R1312 Dysphagia, oropharyngeal phase: Secondary | ICD-10-CM | POA: Diagnosis not present

## 2018-04-16 DIAGNOSIS — Z418 Encounter for other procedures for purposes other than remedying health state: Secondary | ICD-10-CM | POA: Diagnosis not present

## 2018-04-16 DIAGNOSIS — T8249XA Other complication of vascular dialysis catheter, initial encounter: Secondary | ICD-10-CM | POA: Diagnosis not present

## 2018-04-16 DIAGNOSIS — M24522 Contracture, left elbow: Secondary | ICD-10-CM | POA: Diagnosis not present

## 2018-04-16 DIAGNOSIS — F339 Major depressive disorder, recurrent, unspecified: Secondary | ICD-10-CM | POA: Diagnosis not present

## 2018-04-16 DIAGNOSIS — I639 Cerebral infarction, unspecified: Secondary | ICD-10-CM | POA: Diagnosis not present

## 2018-04-16 DIAGNOSIS — N2581 Secondary hyperparathyroidism of renal origin: Secondary | ICD-10-CM | POA: Diagnosis not present

## 2018-04-16 DIAGNOSIS — D631 Anemia in chronic kidney disease: Secondary | ICD-10-CM | POA: Diagnosis not present

## 2018-04-17 DIAGNOSIS — I69954 Hemiplegia and hemiparesis following unspecified cerebrovascular disease affecting left non-dominant side: Secondary | ICD-10-CM | POA: Diagnosis not present

## 2018-04-17 DIAGNOSIS — R1312 Dysphagia, oropharyngeal phase: Secondary | ICD-10-CM | POA: Diagnosis not present

## 2018-04-17 DIAGNOSIS — M24542 Contracture, left hand: Secondary | ICD-10-CM | POA: Diagnosis not present

## 2018-04-17 DIAGNOSIS — M24522 Contracture, left elbow: Secondary | ICD-10-CM | POA: Diagnosis not present

## 2018-04-17 DIAGNOSIS — F339 Major depressive disorder, recurrent, unspecified: Secondary | ICD-10-CM | POA: Diagnosis not present

## 2018-04-17 DIAGNOSIS — I639 Cerebral infarction, unspecified: Secondary | ICD-10-CM | POA: Diagnosis not present

## 2018-04-18 DIAGNOSIS — M24542 Contracture, left hand: Secondary | ICD-10-CM | POA: Diagnosis not present

## 2018-04-18 DIAGNOSIS — T8249XA Other complication of vascular dialysis catheter, initial encounter: Secondary | ICD-10-CM | POA: Diagnosis not present

## 2018-04-18 DIAGNOSIS — Z418 Encounter for other procedures for purposes other than remedying health state: Secondary | ICD-10-CM | POA: Diagnosis not present

## 2018-04-18 DIAGNOSIS — D631 Anemia in chronic kidney disease: Secondary | ICD-10-CM | POA: Diagnosis not present

## 2018-04-18 DIAGNOSIS — F339 Major depressive disorder, recurrent, unspecified: Secondary | ICD-10-CM | POA: Diagnosis not present

## 2018-04-18 DIAGNOSIS — R1312 Dysphagia, oropharyngeal phase: Secondary | ICD-10-CM | POA: Diagnosis not present

## 2018-04-18 DIAGNOSIS — I69954 Hemiplegia and hemiparesis following unspecified cerebrovascular disease affecting left non-dominant side: Secondary | ICD-10-CM | POA: Diagnosis not present

## 2018-04-18 DIAGNOSIS — N186 End stage renal disease: Secondary | ICD-10-CM | POA: Diagnosis not present

## 2018-04-18 DIAGNOSIS — N2581 Secondary hyperparathyroidism of renal origin: Secondary | ICD-10-CM | POA: Diagnosis not present

## 2018-04-18 DIAGNOSIS — M24522 Contracture, left elbow: Secondary | ICD-10-CM | POA: Diagnosis not present

## 2018-04-18 DIAGNOSIS — I639 Cerebral infarction, unspecified: Secondary | ICD-10-CM | POA: Diagnosis not present

## 2018-04-19 DIAGNOSIS — M24522 Contracture, left elbow: Secondary | ICD-10-CM | POA: Diagnosis not present

## 2018-04-19 DIAGNOSIS — R1312 Dysphagia, oropharyngeal phase: Secondary | ICD-10-CM | POA: Diagnosis not present

## 2018-04-19 DIAGNOSIS — F339 Major depressive disorder, recurrent, unspecified: Secondary | ICD-10-CM | POA: Diagnosis not present

## 2018-04-19 DIAGNOSIS — I69954 Hemiplegia and hemiparesis following unspecified cerebrovascular disease affecting left non-dominant side: Secondary | ICD-10-CM | POA: Diagnosis not present

## 2018-04-19 DIAGNOSIS — M24542 Contracture, left hand: Secondary | ICD-10-CM | POA: Diagnosis not present

## 2018-04-19 DIAGNOSIS — I639 Cerebral infarction, unspecified: Secondary | ICD-10-CM | POA: Diagnosis not present

## 2018-04-21 DIAGNOSIS — D631 Anemia in chronic kidney disease: Secondary | ICD-10-CM | POA: Diagnosis not present

## 2018-04-21 DIAGNOSIS — F339 Major depressive disorder, recurrent, unspecified: Secondary | ICD-10-CM | POA: Diagnosis not present

## 2018-04-21 DIAGNOSIS — I69954 Hemiplegia and hemiparesis following unspecified cerebrovascular disease affecting left non-dominant side: Secondary | ICD-10-CM | POA: Diagnosis not present

## 2018-04-21 DIAGNOSIS — T8249XA Other complication of vascular dialysis catheter, initial encounter: Secondary | ICD-10-CM | POA: Diagnosis not present

## 2018-04-21 DIAGNOSIS — M24542 Contracture, left hand: Secondary | ICD-10-CM | POA: Diagnosis not present

## 2018-04-21 DIAGNOSIS — R7881 Bacteremia: Secondary | ICD-10-CM | POA: Diagnosis not present

## 2018-04-21 DIAGNOSIS — N2581 Secondary hyperparathyroidism of renal origin: Secondary | ICD-10-CM | POA: Diagnosis not present

## 2018-04-21 DIAGNOSIS — Z418 Encounter for other procedures for purposes other than remedying health state: Secondary | ICD-10-CM | POA: Diagnosis not present

## 2018-04-21 DIAGNOSIS — M24522 Contracture, left elbow: Secondary | ICD-10-CM | POA: Diagnosis not present

## 2018-04-21 DIAGNOSIS — R1312 Dysphagia, oropharyngeal phase: Secondary | ICD-10-CM | POA: Diagnosis not present

## 2018-04-21 DIAGNOSIS — I639 Cerebral infarction, unspecified: Secondary | ICD-10-CM | POA: Diagnosis not present

## 2018-04-21 DIAGNOSIS — N186 End stage renal disease: Secondary | ICD-10-CM | POA: Diagnosis not present

## 2018-04-22 DIAGNOSIS — M24542 Contracture, left hand: Secondary | ICD-10-CM | POA: Diagnosis not present

## 2018-04-22 DIAGNOSIS — I639 Cerebral infarction, unspecified: Secondary | ICD-10-CM | POA: Diagnosis not present

## 2018-04-22 DIAGNOSIS — M24522 Contracture, left elbow: Secondary | ICD-10-CM | POA: Diagnosis not present

## 2018-04-22 DIAGNOSIS — F339 Major depressive disorder, recurrent, unspecified: Secondary | ICD-10-CM | POA: Diagnosis not present

## 2018-04-22 DIAGNOSIS — I69954 Hemiplegia and hemiparesis following unspecified cerebrovascular disease affecting left non-dominant side: Secondary | ICD-10-CM | POA: Diagnosis not present

## 2018-04-22 DIAGNOSIS — R1312 Dysphagia, oropharyngeal phase: Secondary | ICD-10-CM | POA: Diagnosis not present

## 2018-04-23 DIAGNOSIS — D631 Anemia in chronic kidney disease: Secondary | ICD-10-CM | POA: Diagnosis not present

## 2018-04-23 DIAGNOSIS — Z418 Encounter for other procedures for purposes other than remedying health state: Secondary | ICD-10-CM | POA: Diagnosis not present

## 2018-04-23 DIAGNOSIS — T8249XA Other complication of vascular dialysis catheter, initial encounter: Secondary | ICD-10-CM | POA: Diagnosis not present

## 2018-04-23 DIAGNOSIS — N186 End stage renal disease: Secondary | ICD-10-CM | POA: Diagnosis not present

## 2018-04-23 DIAGNOSIS — N2581 Secondary hyperparathyroidism of renal origin: Secondary | ICD-10-CM | POA: Diagnosis not present

## 2018-04-24 DIAGNOSIS — M24522 Contracture, left elbow: Secondary | ICD-10-CM | POA: Diagnosis not present

## 2018-04-24 DIAGNOSIS — I69954 Hemiplegia and hemiparesis following unspecified cerebrovascular disease affecting left non-dominant side: Secondary | ICD-10-CM | POA: Diagnosis not present

## 2018-04-24 DIAGNOSIS — M24542 Contracture, left hand: Secondary | ICD-10-CM | POA: Diagnosis not present

## 2018-04-24 DIAGNOSIS — F339 Major depressive disorder, recurrent, unspecified: Secondary | ICD-10-CM | POA: Diagnosis not present

## 2018-04-24 DIAGNOSIS — I639 Cerebral infarction, unspecified: Secondary | ICD-10-CM | POA: Diagnosis not present

## 2018-04-24 DIAGNOSIS — R1312 Dysphagia, oropharyngeal phase: Secondary | ICD-10-CM | POA: Diagnosis not present

## 2018-04-25 DIAGNOSIS — I639 Cerebral infarction, unspecified: Secondary | ICD-10-CM | POA: Diagnosis not present

## 2018-04-25 DIAGNOSIS — R1312 Dysphagia, oropharyngeal phase: Secondary | ICD-10-CM | POA: Diagnosis not present

## 2018-04-25 DIAGNOSIS — T8249XA Other complication of vascular dialysis catheter, initial encounter: Secondary | ICD-10-CM | POA: Diagnosis not present

## 2018-04-25 DIAGNOSIS — I69954 Hemiplegia and hemiparesis following unspecified cerebrovascular disease affecting left non-dominant side: Secondary | ICD-10-CM | POA: Diagnosis not present

## 2018-04-25 DIAGNOSIS — N2581 Secondary hyperparathyroidism of renal origin: Secondary | ICD-10-CM | POA: Diagnosis not present

## 2018-04-25 DIAGNOSIS — F339 Major depressive disorder, recurrent, unspecified: Secondary | ICD-10-CM | POA: Diagnosis not present

## 2018-04-25 DIAGNOSIS — D631 Anemia in chronic kidney disease: Secondary | ICD-10-CM | POA: Diagnosis not present

## 2018-04-25 DIAGNOSIS — N186 End stage renal disease: Secondary | ICD-10-CM | POA: Diagnosis not present

## 2018-04-25 DIAGNOSIS — M24522 Contracture, left elbow: Secondary | ICD-10-CM | POA: Diagnosis not present

## 2018-04-25 DIAGNOSIS — M24542 Contracture, left hand: Secondary | ICD-10-CM | POA: Diagnosis not present

## 2018-04-25 DIAGNOSIS — Z418 Encounter for other procedures for purposes other than remedying health state: Secondary | ICD-10-CM | POA: Diagnosis not present

## 2018-04-28 DIAGNOSIS — I69954 Hemiplegia and hemiparesis following unspecified cerebrovascular disease affecting left non-dominant side: Secondary | ICD-10-CM | POA: Diagnosis not present

## 2018-04-28 DIAGNOSIS — F339 Major depressive disorder, recurrent, unspecified: Secondary | ICD-10-CM | POA: Diagnosis not present

## 2018-04-28 DIAGNOSIS — I639 Cerebral infarction, unspecified: Secondary | ICD-10-CM | POA: Diagnosis not present

## 2018-04-28 DIAGNOSIS — M24542 Contracture, left hand: Secondary | ICD-10-CM | POA: Diagnosis not present

## 2018-04-28 DIAGNOSIS — Z418 Encounter for other procedures for purposes other than remedying health state: Secondary | ICD-10-CM | POA: Diagnosis not present

## 2018-04-28 DIAGNOSIS — R1312 Dysphagia, oropharyngeal phase: Secondary | ICD-10-CM | POA: Diagnosis not present

## 2018-04-28 DIAGNOSIS — M24522 Contracture, left elbow: Secondary | ICD-10-CM | POA: Diagnosis not present

## 2018-04-28 DIAGNOSIS — N186 End stage renal disease: Secondary | ICD-10-CM | POA: Diagnosis not present

## 2018-04-28 DIAGNOSIS — T8249XA Other complication of vascular dialysis catheter, initial encounter: Secondary | ICD-10-CM | POA: Diagnosis not present

## 2018-04-28 DIAGNOSIS — N2581 Secondary hyperparathyroidism of renal origin: Secondary | ICD-10-CM | POA: Diagnosis not present

## 2018-04-28 DIAGNOSIS — D631 Anemia in chronic kidney disease: Secondary | ICD-10-CM | POA: Diagnosis not present

## 2018-04-28 DIAGNOSIS — R7881 Bacteremia: Secondary | ICD-10-CM | POA: Diagnosis not present

## 2018-04-29 DIAGNOSIS — I69954 Hemiplegia and hemiparesis following unspecified cerebrovascular disease affecting left non-dominant side: Secondary | ICD-10-CM | POA: Diagnosis not present

## 2018-04-29 DIAGNOSIS — I639 Cerebral infarction, unspecified: Secondary | ICD-10-CM | POA: Diagnosis not present

## 2018-04-29 DIAGNOSIS — F339 Major depressive disorder, recurrent, unspecified: Secondary | ICD-10-CM | POA: Diagnosis not present

## 2018-04-29 DIAGNOSIS — M24542 Contracture, left hand: Secondary | ICD-10-CM | POA: Diagnosis not present

## 2018-04-29 DIAGNOSIS — M24522 Contracture, left elbow: Secondary | ICD-10-CM | POA: Diagnosis not present

## 2018-04-29 DIAGNOSIS — I69351 Hemiplegia and hemiparesis following cerebral infarction affecting right dominant side: Secondary | ICD-10-CM | POA: Diagnosis not present

## 2018-04-29 DIAGNOSIS — N186 End stage renal disease: Secondary | ICD-10-CM | POA: Diagnosis not present

## 2018-04-29 DIAGNOSIS — R1312 Dysphagia, oropharyngeal phase: Secondary | ICD-10-CM | POA: Diagnosis not present

## 2018-04-30 DIAGNOSIS — Z418 Encounter for other procedures for purposes other than remedying health state: Secondary | ICD-10-CM | POA: Diagnosis not present

## 2018-04-30 DIAGNOSIS — D631 Anemia in chronic kidney disease: Secondary | ICD-10-CM | POA: Diagnosis not present

## 2018-04-30 DIAGNOSIS — N2581 Secondary hyperparathyroidism of renal origin: Secondary | ICD-10-CM | POA: Diagnosis not present

## 2018-04-30 DIAGNOSIS — I739 Peripheral vascular disease, unspecified: Secondary | ICD-10-CM | POA: Diagnosis not present

## 2018-04-30 DIAGNOSIS — T8249XA Other complication of vascular dialysis catheter, initial encounter: Secondary | ICD-10-CM | POA: Diagnosis not present

## 2018-04-30 DIAGNOSIS — N250 Renal osteodystrophy: Secondary | ICD-10-CM | POA: Diagnosis not present

## 2018-04-30 DIAGNOSIS — I69351 Hemiplegia and hemiparesis following cerebral infarction affecting right dominant side: Secondary | ICD-10-CM | POA: Diagnosis not present

## 2018-04-30 DIAGNOSIS — N186 End stage renal disease: Secondary | ICD-10-CM | POA: Diagnosis not present

## 2018-05-02 DIAGNOSIS — N186 End stage renal disease: Secondary | ICD-10-CM | POA: Diagnosis not present

## 2018-05-02 DIAGNOSIS — Z418 Encounter for other procedures for purposes other than remedying health state: Secondary | ICD-10-CM | POA: Diagnosis not present

## 2018-05-02 DIAGNOSIS — N2581 Secondary hyperparathyroidism of renal origin: Secondary | ICD-10-CM | POA: Diagnosis not present

## 2018-05-02 DIAGNOSIS — T8249XA Other complication of vascular dialysis catheter, initial encounter: Secondary | ICD-10-CM | POA: Diagnosis not present

## 2018-05-02 DIAGNOSIS — D631 Anemia in chronic kidney disease: Secondary | ICD-10-CM | POA: Diagnosis not present

## 2018-05-04 DIAGNOSIS — R7881 Bacteremia: Secondary | ICD-10-CM | POA: Diagnosis not present

## 2018-05-04 DIAGNOSIS — D631 Anemia in chronic kidney disease: Secondary | ICD-10-CM | POA: Diagnosis not present

## 2018-05-04 DIAGNOSIS — T8249XA Other complication of vascular dialysis catheter, initial encounter: Secondary | ICD-10-CM | POA: Diagnosis not present

## 2018-05-04 DIAGNOSIS — N2581 Secondary hyperparathyroidism of renal origin: Secondary | ICD-10-CM | POA: Diagnosis not present

## 2018-05-04 DIAGNOSIS — Z418 Encounter for other procedures for purposes other than remedying health state: Secondary | ICD-10-CM | POA: Diagnosis not present

## 2018-05-04 DIAGNOSIS — N186 End stage renal disease: Secondary | ICD-10-CM | POA: Diagnosis not present

## 2018-05-06 DIAGNOSIS — Z79899 Other long term (current) drug therapy: Secondary | ICD-10-CM | POA: Diagnosis not present

## 2018-05-06 DIAGNOSIS — T8249XA Other complication of vascular dialysis catheter, initial encounter: Secondary | ICD-10-CM | POA: Diagnosis not present

## 2018-05-06 DIAGNOSIS — Z418 Encounter for other procedures for purposes other than remedying health state: Secondary | ICD-10-CM | POA: Diagnosis not present

## 2018-05-06 DIAGNOSIS — N2581 Secondary hyperparathyroidism of renal origin: Secondary | ICD-10-CM | POA: Diagnosis not present

## 2018-05-06 DIAGNOSIS — N186 End stage renal disease: Secondary | ICD-10-CM | POA: Diagnosis not present

## 2018-05-06 DIAGNOSIS — D631 Anemia in chronic kidney disease: Secondary | ICD-10-CM | POA: Diagnosis not present

## 2018-05-09 DIAGNOSIS — D631 Anemia in chronic kidney disease: Secondary | ICD-10-CM | POA: Diagnosis not present

## 2018-05-09 DIAGNOSIS — Z418 Encounter for other procedures for purposes other than remedying health state: Secondary | ICD-10-CM | POA: Diagnosis not present

## 2018-05-09 DIAGNOSIS — N2581 Secondary hyperparathyroidism of renal origin: Secondary | ICD-10-CM | POA: Diagnosis not present

## 2018-05-09 DIAGNOSIS — N186 End stage renal disease: Secondary | ICD-10-CM | POA: Diagnosis not present

## 2018-05-09 DIAGNOSIS — T8249XA Other complication of vascular dialysis catheter, initial encounter: Secondary | ICD-10-CM | POA: Diagnosis not present

## 2018-05-11 DIAGNOSIS — N186 End stage renal disease: Secondary | ICD-10-CM | POA: Diagnosis not present

## 2018-05-11 DIAGNOSIS — I10 Essential (primary) hypertension: Secondary | ICD-10-CM | POA: Diagnosis not present

## 2018-05-11 DIAGNOSIS — D631 Anemia in chronic kidney disease: Secondary | ICD-10-CM | POA: Diagnosis not present

## 2018-05-12 DIAGNOSIS — D631 Anemia in chronic kidney disease: Secondary | ICD-10-CM | POA: Diagnosis not present

## 2018-05-12 DIAGNOSIS — Z418 Encounter for other procedures for purposes other than remedying health state: Secondary | ICD-10-CM | POA: Diagnosis not present

## 2018-05-12 DIAGNOSIS — N186 End stage renal disease: Secondary | ICD-10-CM | POA: Diagnosis not present

## 2018-05-12 DIAGNOSIS — N2581 Secondary hyperparathyroidism of renal origin: Secondary | ICD-10-CM | POA: Diagnosis not present

## 2018-05-14 DIAGNOSIS — Z418 Encounter for other procedures for purposes other than remedying health state: Secondary | ICD-10-CM | POA: Diagnosis not present

## 2018-05-14 DIAGNOSIS — N186 End stage renal disease: Secondary | ICD-10-CM | POA: Diagnosis not present

## 2018-05-14 DIAGNOSIS — N2581 Secondary hyperparathyroidism of renal origin: Secondary | ICD-10-CM | POA: Diagnosis not present

## 2018-05-14 DIAGNOSIS — D631 Anemia in chronic kidney disease: Secondary | ICD-10-CM | POA: Diagnosis not present

## 2018-05-16 DIAGNOSIS — N186 End stage renal disease: Secondary | ICD-10-CM | POA: Diagnosis not present

## 2018-05-16 DIAGNOSIS — D631 Anemia in chronic kidney disease: Secondary | ICD-10-CM | POA: Diagnosis not present

## 2018-05-16 DIAGNOSIS — Z418 Encounter for other procedures for purposes other than remedying health state: Secondary | ICD-10-CM | POA: Diagnosis not present

## 2018-05-16 DIAGNOSIS — N2581 Secondary hyperparathyroidism of renal origin: Secondary | ICD-10-CM | POA: Diagnosis not present

## 2018-05-19 DIAGNOSIS — N2581 Secondary hyperparathyroidism of renal origin: Secondary | ICD-10-CM | POA: Diagnosis not present

## 2018-05-19 DIAGNOSIS — N186 End stage renal disease: Secondary | ICD-10-CM | POA: Diagnosis not present

## 2018-05-19 DIAGNOSIS — D631 Anemia in chronic kidney disease: Secondary | ICD-10-CM | POA: Diagnosis not present

## 2018-05-19 DIAGNOSIS — Z418 Encounter for other procedures for purposes other than remedying health state: Secondary | ICD-10-CM | POA: Diagnosis not present

## 2018-05-21 DIAGNOSIS — N2581 Secondary hyperparathyroidism of renal origin: Secondary | ICD-10-CM | POA: Diagnosis not present

## 2018-05-21 DIAGNOSIS — D631 Anemia in chronic kidney disease: Secondary | ICD-10-CM | POA: Diagnosis not present

## 2018-05-21 DIAGNOSIS — Z418 Encounter for other procedures for purposes other than remedying health state: Secondary | ICD-10-CM | POA: Diagnosis not present

## 2018-05-21 DIAGNOSIS — N186 End stage renal disease: Secondary | ICD-10-CM | POA: Diagnosis not present

## 2018-05-23 DIAGNOSIS — N186 End stage renal disease: Secondary | ICD-10-CM | POA: Diagnosis not present

## 2018-05-23 DIAGNOSIS — D631 Anemia in chronic kidney disease: Secondary | ICD-10-CM | POA: Diagnosis not present

## 2018-05-23 DIAGNOSIS — N2581 Secondary hyperparathyroidism of renal origin: Secondary | ICD-10-CM | POA: Diagnosis not present

## 2018-05-23 DIAGNOSIS — Z418 Encounter for other procedures for purposes other than remedying health state: Secondary | ICD-10-CM | POA: Diagnosis not present

## 2018-05-26 DIAGNOSIS — D631 Anemia in chronic kidney disease: Secondary | ICD-10-CM | POA: Diagnosis not present

## 2018-05-26 DIAGNOSIS — N186 End stage renal disease: Secondary | ICD-10-CM | POA: Diagnosis not present

## 2018-05-26 DIAGNOSIS — I69351 Hemiplegia and hemiparesis following cerebral infarction affecting right dominant side: Secondary | ICD-10-CM | POA: Diagnosis not present

## 2018-05-26 DIAGNOSIS — E119 Type 2 diabetes mellitus without complications: Secondary | ICD-10-CM | POA: Diagnosis not present

## 2018-05-26 DIAGNOSIS — Z418 Encounter for other procedures for purposes other than remedying health state: Secondary | ICD-10-CM | POA: Diagnosis not present

## 2018-05-26 DIAGNOSIS — N2581 Secondary hyperparathyroidism of renal origin: Secondary | ICD-10-CM | POA: Diagnosis not present

## 2018-05-28 DIAGNOSIS — D631 Anemia in chronic kidney disease: Secondary | ICD-10-CM | POA: Diagnosis not present

## 2018-05-28 DIAGNOSIS — Z418 Encounter for other procedures for purposes other than remedying health state: Secondary | ICD-10-CM | POA: Diagnosis not present

## 2018-05-28 DIAGNOSIS — N2581 Secondary hyperparathyroidism of renal origin: Secondary | ICD-10-CM | POA: Diagnosis not present

## 2018-05-28 DIAGNOSIS — N186 End stage renal disease: Secondary | ICD-10-CM | POA: Diagnosis not present

## 2018-05-29 DIAGNOSIS — I132 Hypertensive heart and chronic kidney disease with heart failure and with stage 5 chronic kidney disease, or end stage renal disease: Secondary | ICD-10-CM | POA: Diagnosis not present

## 2018-05-29 DIAGNOSIS — E1122 Type 2 diabetes mellitus with diabetic chronic kidney disease: Secondary | ICD-10-CM | POA: Diagnosis not present

## 2018-05-29 DIAGNOSIS — I5022 Chronic systolic (congestive) heart failure: Secondary | ICD-10-CM | POA: Diagnosis not present

## 2018-05-29 DIAGNOSIS — N186 End stage renal disease: Secondary | ICD-10-CM | POA: Diagnosis not present

## 2018-05-31 DIAGNOSIS — D631 Anemia in chronic kidney disease: Secondary | ICD-10-CM | POA: Diagnosis not present

## 2018-05-31 DIAGNOSIS — Z418 Encounter for other procedures for purposes other than remedying health state: Secondary | ICD-10-CM | POA: Diagnosis not present

## 2018-05-31 DIAGNOSIS — N2581 Secondary hyperparathyroidism of renal origin: Secondary | ICD-10-CM | POA: Diagnosis not present

## 2018-05-31 DIAGNOSIS — N186 End stage renal disease: Secondary | ICD-10-CM | POA: Diagnosis not present

## 2018-06-02 DIAGNOSIS — N186 End stage renal disease: Secondary | ICD-10-CM | POA: Diagnosis not present

## 2018-06-02 DIAGNOSIS — D631 Anemia in chronic kidney disease: Secondary | ICD-10-CM | POA: Diagnosis not present

## 2018-06-02 DIAGNOSIS — N2581 Secondary hyperparathyroidism of renal origin: Secondary | ICD-10-CM | POA: Diagnosis not present

## 2018-06-02 DIAGNOSIS — Z418 Encounter for other procedures for purposes other than remedying health state: Secondary | ICD-10-CM | POA: Diagnosis not present

## 2018-06-03 DIAGNOSIS — N2581 Secondary hyperparathyroidism of renal origin: Secondary | ICD-10-CM | POA: Diagnosis not present

## 2018-06-03 DIAGNOSIS — N186 End stage renal disease: Secondary | ICD-10-CM | POA: Diagnosis not present

## 2018-06-03 DIAGNOSIS — D631 Anemia in chronic kidney disease: Secondary | ICD-10-CM | POA: Diagnosis not present

## 2018-06-03 DIAGNOSIS — Z418 Encounter for other procedures for purposes other than remedying health state: Secondary | ICD-10-CM | POA: Diagnosis not present

## 2018-06-06 DIAGNOSIS — Z418 Encounter for other procedures for purposes other than remedying health state: Secondary | ICD-10-CM | POA: Diagnosis not present

## 2018-06-06 DIAGNOSIS — N186 End stage renal disease: Secondary | ICD-10-CM | POA: Diagnosis not present

## 2018-06-06 DIAGNOSIS — N2581 Secondary hyperparathyroidism of renal origin: Secondary | ICD-10-CM | POA: Diagnosis not present

## 2018-06-06 DIAGNOSIS — D631 Anemia in chronic kidney disease: Secondary | ICD-10-CM | POA: Diagnosis not present

## 2018-06-08 DIAGNOSIS — N2581 Secondary hyperparathyroidism of renal origin: Secondary | ICD-10-CM | POA: Diagnosis not present

## 2018-06-08 DIAGNOSIS — Z418 Encounter for other procedures for purposes other than remedying health state: Secondary | ICD-10-CM | POA: Diagnosis not present

## 2018-06-08 DIAGNOSIS — N186 End stage renal disease: Secondary | ICD-10-CM | POA: Diagnosis not present

## 2018-06-08 DIAGNOSIS — D631 Anemia in chronic kidney disease: Secondary | ICD-10-CM | POA: Diagnosis not present

## 2018-06-10 DIAGNOSIS — D631 Anemia in chronic kidney disease: Secondary | ICD-10-CM | POA: Diagnosis not present

## 2018-06-10 DIAGNOSIS — Z418 Encounter for other procedures for purposes other than remedying health state: Secondary | ICD-10-CM | POA: Diagnosis not present

## 2018-06-10 DIAGNOSIS — N2581 Secondary hyperparathyroidism of renal origin: Secondary | ICD-10-CM | POA: Diagnosis not present

## 2018-06-10 DIAGNOSIS — N186 End stage renal disease: Secondary | ICD-10-CM | POA: Diagnosis not present

## 2018-06-11 DIAGNOSIS — I10 Essential (primary) hypertension: Secondary | ICD-10-CM | POA: Diagnosis not present

## 2018-06-11 DIAGNOSIS — D631 Anemia in chronic kidney disease: Secondary | ICD-10-CM | POA: Diagnosis not present

## 2018-06-11 DIAGNOSIS — N186 End stage renal disease: Secondary | ICD-10-CM | POA: Diagnosis not present

## 2018-06-13 DIAGNOSIS — Z418 Encounter for other procedures for purposes other than remedying health state: Secondary | ICD-10-CM | POA: Diagnosis not present

## 2018-06-13 DIAGNOSIS — D631 Anemia in chronic kidney disease: Secondary | ICD-10-CM | POA: Diagnosis not present

## 2018-06-13 DIAGNOSIS — N186 End stage renal disease: Secondary | ICD-10-CM | POA: Diagnosis not present

## 2018-06-13 DIAGNOSIS — N2581 Secondary hyperparathyroidism of renal origin: Secondary | ICD-10-CM | POA: Diagnosis not present

## 2018-06-13 DIAGNOSIS — I10 Essential (primary) hypertension: Secondary | ICD-10-CM | POA: Diagnosis not present

## 2018-06-13 DIAGNOSIS — T8249XA Other complication of vascular dialysis catheter, initial encounter: Secondary | ICD-10-CM | POA: Diagnosis not present

## 2018-06-16 DIAGNOSIS — I10 Essential (primary) hypertension: Secondary | ICD-10-CM | POA: Diagnosis not present

## 2018-06-16 DIAGNOSIS — T8249XA Other complication of vascular dialysis catheter, initial encounter: Secondary | ICD-10-CM | POA: Diagnosis not present

## 2018-06-16 DIAGNOSIS — D631 Anemia in chronic kidney disease: Secondary | ICD-10-CM | POA: Diagnosis not present

## 2018-06-16 DIAGNOSIS — R7881 Bacteremia: Secondary | ICD-10-CM | POA: Diagnosis not present

## 2018-06-16 DIAGNOSIS — N186 End stage renal disease: Secondary | ICD-10-CM | POA: Diagnosis not present

## 2018-06-16 DIAGNOSIS — N2581 Secondary hyperparathyroidism of renal origin: Secondary | ICD-10-CM | POA: Diagnosis not present

## 2018-06-16 DIAGNOSIS — Z418 Encounter for other procedures for purposes other than remedying health state: Secondary | ICD-10-CM | POA: Diagnosis not present

## 2018-06-18 DIAGNOSIS — N186 End stage renal disease: Secondary | ICD-10-CM | POA: Diagnosis not present

## 2018-06-18 DIAGNOSIS — N2581 Secondary hyperparathyroidism of renal origin: Secondary | ICD-10-CM | POA: Diagnosis not present

## 2018-06-18 DIAGNOSIS — I10 Essential (primary) hypertension: Secondary | ICD-10-CM | POA: Diagnosis not present

## 2018-06-18 DIAGNOSIS — Z418 Encounter for other procedures for purposes other than remedying health state: Secondary | ICD-10-CM | POA: Diagnosis not present

## 2018-06-18 DIAGNOSIS — T8249XA Other complication of vascular dialysis catheter, initial encounter: Secondary | ICD-10-CM | POA: Diagnosis not present

## 2018-06-18 DIAGNOSIS — D631 Anemia in chronic kidney disease: Secondary | ICD-10-CM | POA: Diagnosis not present

## 2018-06-20 DIAGNOSIS — Z418 Encounter for other procedures for purposes other than remedying health state: Secondary | ICD-10-CM | POA: Diagnosis not present

## 2018-06-20 DIAGNOSIS — T8249XA Other complication of vascular dialysis catheter, initial encounter: Secondary | ICD-10-CM | POA: Diagnosis not present

## 2018-06-20 DIAGNOSIS — N2581 Secondary hyperparathyroidism of renal origin: Secondary | ICD-10-CM | POA: Diagnosis not present

## 2018-06-20 DIAGNOSIS — N186 End stage renal disease: Secondary | ICD-10-CM | POA: Diagnosis not present

## 2018-06-20 DIAGNOSIS — I10 Essential (primary) hypertension: Secondary | ICD-10-CM | POA: Diagnosis not present

## 2018-06-20 DIAGNOSIS — D631 Anemia in chronic kidney disease: Secondary | ICD-10-CM | POA: Diagnosis not present

## 2018-06-23 DIAGNOSIS — T8249XA Other complication of vascular dialysis catheter, initial encounter: Secondary | ICD-10-CM | POA: Diagnosis not present

## 2018-06-23 DIAGNOSIS — I10 Essential (primary) hypertension: Secondary | ICD-10-CM | POA: Diagnosis not present

## 2018-06-23 DIAGNOSIS — N2581 Secondary hyperparathyroidism of renal origin: Secondary | ICD-10-CM | POA: Diagnosis not present

## 2018-06-23 DIAGNOSIS — Z418 Encounter for other procedures for purposes other than remedying health state: Secondary | ICD-10-CM | POA: Diagnosis not present

## 2018-06-23 DIAGNOSIS — D631 Anemia in chronic kidney disease: Secondary | ICD-10-CM | POA: Diagnosis not present

## 2018-06-23 DIAGNOSIS — R7881 Bacteremia: Secondary | ICD-10-CM | POA: Diagnosis not present

## 2018-06-23 DIAGNOSIS — N186 End stage renal disease: Secondary | ICD-10-CM | POA: Diagnosis not present

## 2018-06-25 DIAGNOSIS — N2581 Secondary hyperparathyroidism of renal origin: Secondary | ICD-10-CM | POA: Diagnosis not present

## 2018-06-25 DIAGNOSIS — D631 Anemia in chronic kidney disease: Secondary | ICD-10-CM | POA: Diagnosis not present

## 2018-06-25 DIAGNOSIS — I10 Essential (primary) hypertension: Secondary | ICD-10-CM | POA: Diagnosis not present

## 2018-06-25 DIAGNOSIS — Z418 Encounter for other procedures for purposes other than remedying health state: Secondary | ICD-10-CM | POA: Diagnosis not present

## 2018-06-25 DIAGNOSIS — T8249XA Other complication of vascular dialysis catheter, initial encounter: Secondary | ICD-10-CM | POA: Diagnosis not present

## 2018-06-25 DIAGNOSIS — N186 End stage renal disease: Secondary | ICD-10-CM | POA: Diagnosis not present

## 2018-06-26 DIAGNOSIS — G40909 Epilepsy, unspecified, not intractable, without status epilepticus: Secondary | ICD-10-CM | POA: Diagnosis not present

## 2018-06-26 DIAGNOSIS — I69351 Hemiplegia and hemiparesis following cerebral infarction affecting right dominant side: Secondary | ICD-10-CM | POA: Diagnosis not present

## 2018-06-26 DIAGNOSIS — N186 End stage renal disease: Secondary | ICD-10-CM | POA: Diagnosis not present

## 2018-06-27 DIAGNOSIS — N2581 Secondary hyperparathyroidism of renal origin: Secondary | ICD-10-CM | POA: Diagnosis not present

## 2018-06-27 DIAGNOSIS — I69351 Hemiplegia and hemiparesis following cerebral infarction affecting right dominant side: Secondary | ICD-10-CM | POA: Diagnosis not present

## 2018-06-27 DIAGNOSIS — Z418 Encounter for other procedures for purposes other than remedying health state: Secondary | ICD-10-CM | POA: Diagnosis not present

## 2018-06-27 DIAGNOSIS — E119 Type 2 diabetes mellitus without complications: Secondary | ICD-10-CM | POA: Diagnosis not present

## 2018-06-27 DIAGNOSIS — N186 End stage renal disease: Secondary | ICD-10-CM | POA: Diagnosis not present

## 2018-06-27 DIAGNOSIS — T8249XA Other complication of vascular dialysis catheter, initial encounter: Secondary | ICD-10-CM | POA: Diagnosis not present

## 2018-06-27 DIAGNOSIS — D631 Anemia in chronic kidney disease: Secondary | ICD-10-CM | POA: Diagnosis not present

## 2018-06-27 DIAGNOSIS — I739 Peripheral vascular disease, unspecified: Secondary | ICD-10-CM | POA: Diagnosis not present

## 2018-06-27 DIAGNOSIS — I10 Essential (primary) hypertension: Secondary | ICD-10-CM | POA: Diagnosis not present

## 2018-06-30 DIAGNOSIS — Z418 Encounter for other procedures for purposes other than remedying health state: Secondary | ICD-10-CM | POA: Diagnosis not present

## 2018-06-30 DIAGNOSIS — N186 End stage renal disease: Secondary | ICD-10-CM | POA: Diagnosis not present

## 2018-06-30 DIAGNOSIS — D631 Anemia in chronic kidney disease: Secondary | ICD-10-CM | POA: Diagnosis not present

## 2018-06-30 DIAGNOSIS — R7881 Bacteremia: Secondary | ICD-10-CM | POA: Diagnosis not present

## 2018-06-30 DIAGNOSIS — T8249XA Other complication of vascular dialysis catheter, initial encounter: Secondary | ICD-10-CM | POA: Diagnosis not present

## 2018-06-30 DIAGNOSIS — N2581 Secondary hyperparathyroidism of renal origin: Secondary | ICD-10-CM | POA: Diagnosis not present

## 2018-06-30 DIAGNOSIS — I10 Essential (primary) hypertension: Secondary | ICD-10-CM | POA: Diagnosis not present

## 2018-07-01 DIAGNOSIS — I482 Chronic atrial fibrillation, unspecified: Secondary | ICD-10-CM | POA: Diagnosis not present

## 2018-07-01 DIAGNOSIS — I10 Essential (primary) hypertension: Secondary | ICD-10-CM | POA: Diagnosis not present

## 2018-07-01 DIAGNOSIS — Z89511 Acquired absence of right leg below knee: Secondary | ICD-10-CM | POA: Diagnosis not present

## 2018-07-01 DIAGNOSIS — R569 Unspecified convulsions: Secondary | ICD-10-CM | POA: Diagnosis not present

## 2018-07-01 DIAGNOSIS — R1312 Dysphagia, oropharyngeal phase: Secondary | ICD-10-CM | POA: Diagnosis not present

## 2018-07-01 DIAGNOSIS — Z89612 Acquired absence of left leg above knee: Secondary | ICD-10-CM | POA: Diagnosis not present

## 2018-07-01 DIAGNOSIS — K5900 Constipation, unspecified: Secondary | ICD-10-CM | POA: Diagnosis not present

## 2018-07-01 DIAGNOSIS — E1122 Type 2 diabetes mellitus with diabetic chronic kidney disease: Secondary | ICD-10-CM | POA: Diagnosis not present

## 2018-07-01 DIAGNOSIS — D631 Anemia in chronic kidney disease: Secondary | ICD-10-CM | POA: Diagnosis not present

## 2018-07-01 DIAGNOSIS — M24522 Contracture, left elbow: Secondary | ICD-10-CM | POA: Diagnosis not present

## 2018-07-01 DIAGNOSIS — I429 Cardiomyopathy, unspecified: Secondary | ICD-10-CM | POA: Diagnosis not present

## 2018-07-01 DIAGNOSIS — H548 Legal blindness, as defined in USA: Secondary | ICD-10-CM | POA: Diagnosis not present

## 2018-07-01 DIAGNOSIS — E559 Vitamin D deficiency, unspecified: Secondary | ICD-10-CM | POA: Diagnosis not present

## 2018-07-01 DIAGNOSIS — I69954 Hemiplegia and hemiparesis following unspecified cerebrovascular disease affecting left non-dominant side: Secondary | ICD-10-CM | POA: Diagnosis not present

## 2018-07-01 DIAGNOSIS — E875 Hyperkalemia: Secondary | ICD-10-CM | POA: Diagnosis not present

## 2018-07-01 DIAGNOSIS — Z992 Dependence on renal dialysis: Secondary | ICD-10-CM | POA: Diagnosis not present

## 2018-07-01 DIAGNOSIS — F339 Major depressive disorder, recurrent, unspecified: Secondary | ICD-10-CM | POA: Diagnosis not present

## 2018-07-01 DIAGNOSIS — E785 Hyperlipidemia, unspecified: Secondary | ICD-10-CM | POA: Diagnosis not present

## 2018-07-01 DIAGNOSIS — I639 Cerebral infarction, unspecified: Secondary | ICD-10-CM | POA: Diagnosis not present

## 2018-07-01 DIAGNOSIS — I739 Peripheral vascular disease, unspecified: Secondary | ICD-10-CM | POA: Diagnosis not present

## 2018-07-01 DIAGNOSIS — N186 End stage renal disease: Secondary | ICD-10-CM | POA: Diagnosis not present

## 2018-07-01 DIAGNOSIS — M24542 Contracture, left hand: Secondary | ICD-10-CM | POA: Diagnosis not present

## 2018-07-01 DIAGNOSIS — E1169 Type 2 diabetes mellitus with other specified complication: Secondary | ICD-10-CM | POA: Diagnosis not present

## 2018-07-01 DIAGNOSIS — I509 Heart failure, unspecified: Secondary | ICD-10-CM | POA: Diagnosis not present

## 2018-07-02 DIAGNOSIS — N186 End stage renal disease: Secondary | ICD-10-CM | POA: Diagnosis not present

## 2018-07-02 DIAGNOSIS — I10 Essential (primary) hypertension: Secondary | ICD-10-CM | POA: Diagnosis not present

## 2018-07-02 DIAGNOSIS — T8249XA Other complication of vascular dialysis catheter, initial encounter: Secondary | ICD-10-CM | POA: Diagnosis not present

## 2018-07-02 DIAGNOSIS — Z418 Encounter for other procedures for purposes other than remedying health state: Secondary | ICD-10-CM | POA: Diagnosis not present

## 2018-07-02 DIAGNOSIS — N2581 Secondary hyperparathyroidism of renal origin: Secondary | ICD-10-CM | POA: Diagnosis not present

## 2018-07-02 DIAGNOSIS — D631 Anemia in chronic kidney disease: Secondary | ICD-10-CM | POA: Diagnosis not present

## 2018-07-03 DIAGNOSIS — M24542 Contracture, left hand: Secondary | ICD-10-CM | POA: Diagnosis not present

## 2018-07-03 DIAGNOSIS — I69954 Hemiplegia and hemiparesis following unspecified cerebrovascular disease affecting left non-dominant side: Secondary | ICD-10-CM | POA: Diagnosis not present

## 2018-07-03 DIAGNOSIS — I639 Cerebral infarction, unspecified: Secondary | ICD-10-CM | POA: Diagnosis not present

## 2018-07-03 DIAGNOSIS — F339 Major depressive disorder, recurrent, unspecified: Secondary | ICD-10-CM | POA: Diagnosis not present

## 2018-07-03 DIAGNOSIS — R1312 Dysphagia, oropharyngeal phase: Secondary | ICD-10-CM | POA: Diagnosis not present

## 2018-07-03 DIAGNOSIS — M24522 Contracture, left elbow: Secondary | ICD-10-CM | POA: Diagnosis not present

## 2018-07-04 DIAGNOSIS — T8249XA Other complication of vascular dialysis catheter, initial encounter: Secondary | ICD-10-CM | POA: Diagnosis not present

## 2018-07-04 DIAGNOSIS — M24542 Contracture, left hand: Secondary | ICD-10-CM | POA: Diagnosis not present

## 2018-07-04 DIAGNOSIS — I10 Essential (primary) hypertension: Secondary | ICD-10-CM | POA: Diagnosis not present

## 2018-07-04 DIAGNOSIS — I69954 Hemiplegia and hemiparesis following unspecified cerebrovascular disease affecting left non-dominant side: Secondary | ICD-10-CM | POA: Diagnosis not present

## 2018-07-04 DIAGNOSIS — F339 Major depressive disorder, recurrent, unspecified: Secondary | ICD-10-CM | POA: Diagnosis not present

## 2018-07-04 DIAGNOSIS — Z418 Encounter for other procedures for purposes other than remedying health state: Secondary | ICD-10-CM | POA: Diagnosis not present

## 2018-07-04 DIAGNOSIS — D631 Anemia in chronic kidney disease: Secondary | ICD-10-CM | POA: Diagnosis not present

## 2018-07-04 DIAGNOSIS — R1312 Dysphagia, oropharyngeal phase: Secondary | ICD-10-CM | POA: Diagnosis not present

## 2018-07-04 DIAGNOSIS — N186 End stage renal disease: Secondary | ICD-10-CM | POA: Diagnosis not present

## 2018-07-04 DIAGNOSIS — N2581 Secondary hyperparathyroidism of renal origin: Secondary | ICD-10-CM | POA: Diagnosis not present

## 2018-07-04 DIAGNOSIS — M24522 Contracture, left elbow: Secondary | ICD-10-CM | POA: Diagnosis not present

## 2018-07-04 DIAGNOSIS — I639 Cerebral infarction, unspecified: Secondary | ICD-10-CM | POA: Diagnosis not present

## 2018-07-07 DIAGNOSIS — N2581 Secondary hyperparathyroidism of renal origin: Secondary | ICD-10-CM | POA: Diagnosis not present

## 2018-07-07 DIAGNOSIS — I10 Essential (primary) hypertension: Secondary | ICD-10-CM | POA: Diagnosis not present

## 2018-07-07 DIAGNOSIS — Z418 Encounter for other procedures for purposes other than remedying health state: Secondary | ICD-10-CM | POA: Diagnosis not present

## 2018-07-07 DIAGNOSIS — D631 Anemia in chronic kidney disease: Secondary | ICD-10-CM | POA: Diagnosis not present

## 2018-07-07 DIAGNOSIS — I69954 Hemiplegia and hemiparesis following unspecified cerebrovascular disease affecting left non-dominant side: Secondary | ICD-10-CM | POA: Diagnosis not present

## 2018-07-07 DIAGNOSIS — R1312 Dysphagia, oropharyngeal phase: Secondary | ICD-10-CM | POA: Diagnosis not present

## 2018-07-07 DIAGNOSIS — T8249XA Other complication of vascular dialysis catheter, initial encounter: Secondary | ICD-10-CM | POA: Diagnosis not present

## 2018-07-07 DIAGNOSIS — F339 Major depressive disorder, recurrent, unspecified: Secondary | ICD-10-CM | POA: Diagnosis not present

## 2018-07-07 DIAGNOSIS — M24522 Contracture, left elbow: Secondary | ICD-10-CM | POA: Diagnosis not present

## 2018-07-07 DIAGNOSIS — I639 Cerebral infarction, unspecified: Secondary | ICD-10-CM | POA: Diagnosis not present

## 2018-07-07 DIAGNOSIS — M24542 Contracture, left hand: Secondary | ICD-10-CM | POA: Diagnosis not present

## 2018-07-07 DIAGNOSIS — N186 End stage renal disease: Secondary | ICD-10-CM | POA: Diagnosis not present

## 2018-07-07 DIAGNOSIS — R7881 Bacteremia: Secondary | ICD-10-CM | POA: Diagnosis not present

## 2018-07-08 DIAGNOSIS — F339 Major depressive disorder, recurrent, unspecified: Secondary | ICD-10-CM | POA: Diagnosis not present

## 2018-07-08 DIAGNOSIS — R1312 Dysphagia, oropharyngeal phase: Secondary | ICD-10-CM | POA: Diagnosis not present

## 2018-07-08 DIAGNOSIS — M24522 Contracture, left elbow: Secondary | ICD-10-CM | POA: Diagnosis not present

## 2018-07-08 DIAGNOSIS — M24542 Contracture, left hand: Secondary | ICD-10-CM | POA: Diagnosis not present

## 2018-07-08 DIAGNOSIS — I639 Cerebral infarction, unspecified: Secondary | ICD-10-CM | POA: Diagnosis not present

## 2018-07-08 DIAGNOSIS — I69954 Hemiplegia and hemiparesis following unspecified cerebrovascular disease affecting left non-dominant side: Secondary | ICD-10-CM | POA: Diagnosis not present

## 2018-07-09 DIAGNOSIS — N186 End stage renal disease: Secondary | ICD-10-CM | POA: Diagnosis not present

## 2018-07-09 DIAGNOSIS — T8249XA Other complication of vascular dialysis catheter, initial encounter: Secondary | ICD-10-CM | POA: Diagnosis not present

## 2018-07-09 DIAGNOSIS — M24542 Contracture, left hand: Secondary | ICD-10-CM | POA: Diagnosis not present

## 2018-07-09 DIAGNOSIS — R1312 Dysphagia, oropharyngeal phase: Secondary | ICD-10-CM | POA: Diagnosis not present

## 2018-07-09 DIAGNOSIS — N2581 Secondary hyperparathyroidism of renal origin: Secondary | ICD-10-CM | POA: Diagnosis not present

## 2018-07-09 DIAGNOSIS — I639 Cerebral infarction, unspecified: Secondary | ICD-10-CM | POA: Diagnosis not present

## 2018-07-09 DIAGNOSIS — D631 Anemia in chronic kidney disease: Secondary | ICD-10-CM | POA: Diagnosis not present

## 2018-07-09 DIAGNOSIS — F339 Major depressive disorder, recurrent, unspecified: Secondary | ICD-10-CM | POA: Diagnosis not present

## 2018-07-09 DIAGNOSIS — M24522 Contracture, left elbow: Secondary | ICD-10-CM | POA: Diagnosis not present

## 2018-07-09 DIAGNOSIS — I10 Essential (primary) hypertension: Secondary | ICD-10-CM | POA: Diagnosis not present

## 2018-07-09 DIAGNOSIS — Z418 Encounter for other procedures for purposes other than remedying health state: Secondary | ICD-10-CM | POA: Diagnosis not present

## 2018-07-09 DIAGNOSIS — I69954 Hemiplegia and hemiparesis following unspecified cerebrovascular disease affecting left non-dominant side: Secondary | ICD-10-CM | POA: Diagnosis not present

## 2018-07-10 DIAGNOSIS — R1312 Dysphagia, oropharyngeal phase: Secondary | ICD-10-CM | POA: Diagnosis not present

## 2018-07-10 DIAGNOSIS — M24522 Contracture, left elbow: Secondary | ICD-10-CM | POA: Diagnosis not present

## 2018-07-10 DIAGNOSIS — I69954 Hemiplegia and hemiparesis following unspecified cerebrovascular disease affecting left non-dominant side: Secondary | ICD-10-CM | POA: Diagnosis not present

## 2018-07-10 DIAGNOSIS — I639 Cerebral infarction, unspecified: Secondary | ICD-10-CM | POA: Diagnosis not present

## 2018-07-10 DIAGNOSIS — F339 Major depressive disorder, recurrent, unspecified: Secondary | ICD-10-CM | POA: Diagnosis not present

## 2018-07-10 DIAGNOSIS — M24542 Contracture, left hand: Secondary | ICD-10-CM | POA: Diagnosis not present

## 2018-07-11 DIAGNOSIS — F339 Major depressive disorder, recurrent, unspecified: Secondary | ICD-10-CM | POA: Diagnosis not present

## 2018-07-11 DIAGNOSIS — N186 End stage renal disease: Secondary | ICD-10-CM | POA: Diagnosis not present

## 2018-07-11 DIAGNOSIS — N2581 Secondary hyperparathyroidism of renal origin: Secondary | ICD-10-CM | POA: Diagnosis not present

## 2018-07-11 DIAGNOSIS — I69954 Hemiplegia and hemiparesis following unspecified cerebrovascular disease affecting left non-dominant side: Secondary | ICD-10-CM | POA: Diagnosis not present

## 2018-07-11 DIAGNOSIS — Z418 Encounter for other procedures for purposes other than remedying health state: Secondary | ICD-10-CM | POA: Diagnosis not present

## 2018-07-11 DIAGNOSIS — M24542 Contracture, left hand: Secondary | ICD-10-CM | POA: Diagnosis not present

## 2018-07-11 DIAGNOSIS — R1312 Dysphagia, oropharyngeal phase: Secondary | ICD-10-CM | POA: Diagnosis not present

## 2018-07-11 DIAGNOSIS — I639 Cerebral infarction, unspecified: Secondary | ICD-10-CM | POA: Diagnosis not present

## 2018-07-11 DIAGNOSIS — M24522 Contracture, left elbow: Secondary | ICD-10-CM | POA: Diagnosis not present

## 2018-07-11 DIAGNOSIS — T8249XA Other complication of vascular dialysis catheter, initial encounter: Secondary | ICD-10-CM | POA: Diagnosis not present

## 2018-07-11 DIAGNOSIS — D631 Anemia in chronic kidney disease: Secondary | ICD-10-CM | POA: Diagnosis not present

## 2018-07-11 DIAGNOSIS — I10 Essential (primary) hypertension: Secondary | ICD-10-CM | POA: Diagnosis not present

## 2018-07-12 DIAGNOSIS — I10 Essential (primary) hypertension: Secondary | ICD-10-CM | POA: Diagnosis not present

## 2018-07-12 DIAGNOSIS — N186 End stage renal disease: Secondary | ICD-10-CM | POA: Diagnosis not present

## 2018-07-12 DIAGNOSIS — D631 Anemia in chronic kidney disease: Secondary | ICD-10-CM | POA: Diagnosis not present

## 2018-07-14 DIAGNOSIS — N186 End stage renal disease: Secondary | ICD-10-CM | POA: Diagnosis not present

## 2018-07-14 DIAGNOSIS — Z418 Encounter for other procedures for purposes other than remedying health state: Secondary | ICD-10-CM | POA: Diagnosis not present

## 2018-07-14 DIAGNOSIS — N2581 Secondary hyperparathyroidism of renal origin: Secondary | ICD-10-CM | POA: Diagnosis not present

## 2018-07-14 DIAGNOSIS — T8249XA Other complication of vascular dialysis catheter, initial encounter: Secondary | ICD-10-CM | POA: Diagnosis not present

## 2018-07-14 DIAGNOSIS — D631 Anemia in chronic kidney disease: Secondary | ICD-10-CM | POA: Diagnosis not present

## 2018-07-15 DIAGNOSIS — F339 Major depressive disorder, recurrent, unspecified: Secondary | ICD-10-CM | POA: Diagnosis not present

## 2018-07-15 DIAGNOSIS — R1312 Dysphagia, oropharyngeal phase: Secondary | ICD-10-CM | POA: Diagnosis not present

## 2018-07-15 DIAGNOSIS — Z89511 Acquired absence of right leg below knee: Secondary | ICD-10-CM | POA: Diagnosis not present

## 2018-07-15 DIAGNOSIS — E875 Hyperkalemia: Secondary | ICD-10-CM | POA: Diagnosis not present

## 2018-07-15 DIAGNOSIS — N186 End stage renal disease: Secondary | ICD-10-CM | POA: Diagnosis not present

## 2018-07-15 DIAGNOSIS — M24522 Contracture, left elbow: Secondary | ICD-10-CM | POA: Diagnosis not present

## 2018-07-15 DIAGNOSIS — E559 Vitamin D deficiency, unspecified: Secondary | ICD-10-CM | POA: Diagnosis not present

## 2018-07-15 DIAGNOSIS — M24542 Contracture, left hand: Secondary | ICD-10-CM | POA: Diagnosis not present

## 2018-07-15 DIAGNOSIS — I10 Essential (primary) hypertension: Secondary | ICD-10-CM | POA: Diagnosis not present

## 2018-07-15 DIAGNOSIS — E1169 Type 2 diabetes mellitus with other specified complication: Secondary | ICD-10-CM | POA: Diagnosis not present

## 2018-07-15 DIAGNOSIS — H548 Legal blindness, as defined in USA: Secondary | ICD-10-CM | POA: Diagnosis not present

## 2018-07-15 DIAGNOSIS — I739 Peripheral vascular disease, unspecified: Secondary | ICD-10-CM | POA: Diagnosis not present

## 2018-07-15 DIAGNOSIS — I69954 Hemiplegia and hemiparesis following unspecified cerebrovascular disease affecting left non-dominant side: Secondary | ICD-10-CM | POA: Diagnosis not present

## 2018-07-15 DIAGNOSIS — E785 Hyperlipidemia, unspecified: Secondary | ICD-10-CM | POA: Diagnosis not present

## 2018-07-15 DIAGNOSIS — K5900 Constipation, unspecified: Secondary | ICD-10-CM | POA: Diagnosis not present

## 2018-07-15 DIAGNOSIS — R569 Unspecified convulsions: Secondary | ICD-10-CM | POA: Diagnosis not present

## 2018-07-15 DIAGNOSIS — I482 Chronic atrial fibrillation, unspecified: Secondary | ICD-10-CM | POA: Diagnosis not present

## 2018-07-15 DIAGNOSIS — I509 Heart failure, unspecified: Secondary | ICD-10-CM | POA: Diagnosis not present

## 2018-07-15 DIAGNOSIS — E1122 Type 2 diabetes mellitus with diabetic chronic kidney disease: Secondary | ICD-10-CM | POA: Diagnosis not present

## 2018-07-15 DIAGNOSIS — Z89612 Acquired absence of left leg above knee: Secondary | ICD-10-CM | POA: Diagnosis not present

## 2018-07-15 DIAGNOSIS — I639 Cerebral infarction, unspecified: Secondary | ICD-10-CM | POA: Diagnosis not present

## 2018-07-15 DIAGNOSIS — I429 Cardiomyopathy, unspecified: Secondary | ICD-10-CM | POA: Diagnosis not present

## 2018-07-15 DIAGNOSIS — Z992 Dependence on renal dialysis: Secondary | ICD-10-CM | POA: Diagnosis not present

## 2018-07-15 DIAGNOSIS — D631 Anemia in chronic kidney disease: Secondary | ICD-10-CM | POA: Diagnosis not present

## 2018-07-16 DIAGNOSIS — I639 Cerebral infarction, unspecified: Secondary | ICD-10-CM | POA: Diagnosis not present

## 2018-07-16 DIAGNOSIS — M24522 Contracture, left elbow: Secondary | ICD-10-CM | POA: Diagnosis not present

## 2018-07-16 DIAGNOSIS — I69954 Hemiplegia and hemiparesis following unspecified cerebrovascular disease affecting left non-dominant side: Secondary | ICD-10-CM | POA: Diagnosis not present

## 2018-07-16 DIAGNOSIS — D631 Anemia in chronic kidney disease: Secondary | ICD-10-CM | POA: Diagnosis not present

## 2018-07-16 DIAGNOSIS — Z418 Encounter for other procedures for purposes other than remedying health state: Secondary | ICD-10-CM | POA: Diagnosis not present

## 2018-07-16 DIAGNOSIS — M24542 Contracture, left hand: Secondary | ICD-10-CM | POA: Diagnosis not present

## 2018-07-16 DIAGNOSIS — N2581 Secondary hyperparathyroidism of renal origin: Secondary | ICD-10-CM | POA: Diagnosis not present

## 2018-07-16 DIAGNOSIS — F339 Major depressive disorder, recurrent, unspecified: Secondary | ICD-10-CM | POA: Diagnosis not present

## 2018-07-16 DIAGNOSIS — N186 End stage renal disease: Secondary | ICD-10-CM | POA: Diagnosis not present

## 2018-07-16 DIAGNOSIS — T8249XA Other complication of vascular dialysis catheter, initial encounter: Secondary | ICD-10-CM | POA: Diagnosis not present

## 2018-07-16 DIAGNOSIS — R1312 Dysphagia, oropharyngeal phase: Secondary | ICD-10-CM | POA: Diagnosis not present

## 2018-07-17 DIAGNOSIS — M24522 Contracture, left elbow: Secondary | ICD-10-CM | POA: Diagnosis not present

## 2018-07-17 DIAGNOSIS — I69954 Hemiplegia and hemiparesis following unspecified cerebrovascular disease affecting left non-dominant side: Secondary | ICD-10-CM | POA: Diagnosis not present

## 2018-07-17 DIAGNOSIS — F339 Major depressive disorder, recurrent, unspecified: Secondary | ICD-10-CM | POA: Diagnosis not present

## 2018-07-17 DIAGNOSIS — I639 Cerebral infarction, unspecified: Secondary | ICD-10-CM | POA: Diagnosis not present

## 2018-07-17 DIAGNOSIS — M24542 Contracture, left hand: Secondary | ICD-10-CM | POA: Diagnosis not present

## 2018-07-17 DIAGNOSIS — R1312 Dysphagia, oropharyngeal phase: Secondary | ICD-10-CM | POA: Diagnosis not present

## 2018-07-18 DIAGNOSIS — T8249XA Other complication of vascular dialysis catheter, initial encounter: Secondary | ICD-10-CM | POA: Diagnosis not present

## 2018-07-18 DIAGNOSIS — Z418 Encounter for other procedures for purposes other than remedying health state: Secondary | ICD-10-CM | POA: Diagnosis not present

## 2018-07-18 DIAGNOSIS — D631 Anemia in chronic kidney disease: Secondary | ICD-10-CM | POA: Diagnosis not present

## 2018-07-18 DIAGNOSIS — R1312 Dysphagia, oropharyngeal phase: Secondary | ICD-10-CM | POA: Diagnosis not present

## 2018-07-18 DIAGNOSIS — M24522 Contracture, left elbow: Secondary | ICD-10-CM | POA: Diagnosis not present

## 2018-07-18 DIAGNOSIS — N186 End stage renal disease: Secondary | ICD-10-CM | POA: Diagnosis not present

## 2018-07-18 DIAGNOSIS — F339 Major depressive disorder, recurrent, unspecified: Secondary | ICD-10-CM | POA: Diagnosis not present

## 2018-07-18 DIAGNOSIS — I69954 Hemiplegia and hemiparesis following unspecified cerebrovascular disease affecting left non-dominant side: Secondary | ICD-10-CM | POA: Diagnosis not present

## 2018-07-18 DIAGNOSIS — I639 Cerebral infarction, unspecified: Secondary | ICD-10-CM | POA: Diagnosis not present

## 2018-07-18 DIAGNOSIS — N2581 Secondary hyperparathyroidism of renal origin: Secondary | ICD-10-CM | POA: Diagnosis not present

## 2018-07-18 DIAGNOSIS — M24542 Contracture, left hand: Secondary | ICD-10-CM | POA: Diagnosis not present

## 2018-07-21 DIAGNOSIS — D631 Anemia in chronic kidney disease: Secondary | ICD-10-CM | POA: Diagnosis not present

## 2018-07-21 DIAGNOSIS — R1312 Dysphagia, oropharyngeal phase: Secondary | ICD-10-CM | POA: Diagnosis not present

## 2018-07-21 DIAGNOSIS — M24522 Contracture, left elbow: Secondary | ICD-10-CM | POA: Diagnosis not present

## 2018-07-21 DIAGNOSIS — M24542 Contracture, left hand: Secondary | ICD-10-CM | POA: Diagnosis not present

## 2018-07-21 DIAGNOSIS — T8249XA Other complication of vascular dialysis catheter, initial encounter: Secondary | ICD-10-CM | POA: Diagnosis not present

## 2018-07-21 DIAGNOSIS — F339 Major depressive disorder, recurrent, unspecified: Secondary | ICD-10-CM | POA: Diagnosis not present

## 2018-07-21 DIAGNOSIS — Z418 Encounter for other procedures for purposes other than remedying health state: Secondary | ICD-10-CM | POA: Diagnosis not present

## 2018-07-21 DIAGNOSIS — N2581 Secondary hyperparathyroidism of renal origin: Secondary | ICD-10-CM | POA: Diagnosis not present

## 2018-07-21 DIAGNOSIS — I69954 Hemiplegia and hemiparesis following unspecified cerebrovascular disease affecting left non-dominant side: Secondary | ICD-10-CM | POA: Diagnosis not present

## 2018-07-21 DIAGNOSIS — I639 Cerebral infarction, unspecified: Secondary | ICD-10-CM | POA: Diagnosis not present

## 2018-07-21 DIAGNOSIS — N186 End stage renal disease: Secondary | ICD-10-CM | POA: Diagnosis not present

## 2018-07-22 DIAGNOSIS — M24522 Contracture, left elbow: Secondary | ICD-10-CM | POA: Diagnosis not present

## 2018-07-22 DIAGNOSIS — I69954 Hemiplegia and hemiparesis following unspecified cerebrovascular disease affecting left non-dominant side: Secondary | ICD-10-CM | POA: Diagnosis not present

## 2018-07-22 DIAGNOSIS — R1312 Dysphagia, oropharyngeal phase: Secondary | ICD-10-CM | POA: Diagnosis not present

## 2018-07-22 DIAGNOSIS — F339 Major depressive disorder, recurrent, unspecified: Secondary | ICD-10-CM | POA: Diagnosis not present

## 2018-07-22 DIAGNOSIS — I639 Cerebral infarction, unspecified: Secondary | ICD-10-CM | POA: Diagnosis not present

## 2018-07-22 DIAGNOSIS — M24542 Contracture, left hand: Secondary | ICD-10-CM | POA: Diagnosis not present

## 2018-07-23 DIAGNOSIS — R1312 Dysphagia, oropharyngeal phase: Secondary | ICD-10-CM | POA: Diagnosis not present

## 2018-07-23 DIAGNOSIS — M24522 Contracture, left elbow: Secondary | ICD-10-CM | POA: Diagnosis not present

## 2018-07-23 DIAGNOSIS — I69954 Hemiplegia and hemiparesis following unspecified cerebrovascular disease affecting left non-dominant side: Secondary | ICD-10-CM | POA: Diagnosis not present

## 2018-07-23 DIAGNOSIS — D631 Anemia in chronic kidney disease: Secondary | ICD-10-CM | POA: Diagnosis not present

## 2018-07-23 DIAGNOSIS — N186 End stage renal disease: Secondary | ICD-10-CM | POA: Diagnosis not present

## 2018-07-23 DIAGNOSIS — T8249XA Other complication of vascular dialysis catheter, initial encounter: Secondary | ICD-10-CM | POA: Diagnosis not present

## 2018-07-23 DIAGNOSIS — N2581 Secondary hyperparathyroidism of renal origin: Secondary | ICD-10-CM | POA: Diagnosis not present

## 2018-07-23 DIAGNOSIS — F339 Major depressive disorder, recurrent, unspecified: Secondary | ICD-10-CM | POA: Diagnosis not present

## 2018-07-23 DIAGNOSIS — I639 Cerebral infarction, unspecified: Secondary | ICD-10-CM | POA: Diagnosis not present

## 2018-07-23 DIAGNOSIS — M24542 Contracture, left hand: Secondary | ICD-10-CM | POA: Diagnosis not present

## 2018-07-23 DIAGNOSIS — Z418 Encounter for other procedures for purposes other than remedying health state: Secondary | ICD-10-CM | POA: Diagnosis not present

## 2018-07-24 DIAGNOSIS — M24522 Contracture, left elbow: Secondary | ICD-10-CM | POA: Diagnosis not present

## 2018-07-24 DIAGNOSIS — I69954 Hemiplegia and hemiparesis following unspecified cerebrovascular disease affecting left non-dominant side: Secondary | ICD-10-CM | POA: Diagnosis not present

## 2018-07-24 DIAGNOSIS — I69354 Hemiplegia and hemiparesis following cerebral infarction affecting left non-dominant side: Secondary | ICD-10-CM | POA: Diagnosis not present

## 2018-07-24 DIAGNOSIS — R1312 Dysphagia, oropharyngeal phase: Secondary | ICD-10-CM | POA: Diagnosis not present

## 2018-07-24 DIAGNOSIS — I639 Cerebral infarction, unspecified: Secondary | ICD-10-CM | POA: Diagnosis not present

## 2018-07-24 DIAGNOSIS — E1122 Type 2 diabetes mellitus with diabetic chronic kidney disease: Secondary | ICD-10-CM | POA: Diagnosis not present

## 2018-07-24 DIAGNOSIS — F339 Major depressive disorder, recurrent, unspecified: Secondary | ICD-10-CM | POA: Diagnosis not present

## 2018-07-24 DIAGNOSIS — I5022 Chronic systolic (congestive) heart failure: Secondary | ICD-10-CM | POA: Diagnosis not present

## 2018-07-24 DIAGNOSIS — N186 End stage renal disease: Secondary | ICD-10-CM | POA: Diagnosis not present

## 2018-07-24 DIAGNOSIS — M24542 Contracture, left hand: Secondary | ICD-10-CM | POA: Diagnosis not present

## 2018-07-25 DIAGNOSIS — M24542 Contracture, left hand: Secondary | ICD-10-CM | POA: Diagnosis not present

## 2018-07-25 DIAGNOSIS — F339 Major depressive disorder, recurrent, unspecified: Secondary | ICD-10-CM | POA: Diagnosis not present

## 2018-07-25 DIAGNOSIS — D631 Anemia in chronic kidney disease: Secondary | ICD-10-CM | POA: Diagnosis not present

## 2018-07-25 DIAGNOSIS — T8249XA Other complication of vascular dialysis catheter, initial encounter: Secondary | ICD-10-CM | POA: Diagnosis not present

## 2018-07-25 DIAGNOSIS — N2581 Secondary hyperparathyroidism of renal origin: Secondary | ICD-10-CM | POA: Diagnosis not present

## 2018-07-25 DIAGNOSIS — I69954 Hemiplegia and hemiparesis following unspecified cerebrovascular disease affecting left non-dominant side: Secondary | ICD-10-CM | POA: Diagnosis not present

## 2018-07-25 DIAGNOSIS — R1312 Dysphagia, oropharyngeal phase: Secondary | ICD-10-CM | POA: Diagnosis not present

## 2018-07-25 DIAGNOSIS — N186 End stage renal disease: Secondary | ICD-10-CM | POA: Diagnosis not present

## 2018-07-25 DIAGNOSIS — M24522 Contracture, left elbow: Secondary | ICD-10-CM | POA: Diagnosis not present

## 2018-07-25 DIAGNOSIS — I639 Cerebral infarction, unspecified: Secondary | ICD-10-CM | POA: Diagnosis not present

## 2018-07-25 DIAGNOSIS — Z418 Encounter for other procedures for purposes other than remedying health state: Secondary | ICD-10-CM | POA: Diagnosis not present

## 2018-07-28 DIAGNOSIS — I639 Cerebral infarction, unspecified: Secondary | ICD-10-CM | POA: Diagnosis not present

## 2018-07-28 DIAGNOSIS — N2581 Secondary hyperparathyroidism of renal origin: Secondary | ICD-10-CM | POA: Diagnosis not present

## 2018-07-28 DIAGNOSIS — I69351 Hemiplegia and hemiparesis following cerebral infarction affecting right dominant side: Secondary | ICD-10-CM | POA: Diagnosis not present

## 2018-07-28 DIAGNOSIS — N186 End stage renal disease: Secondary | ICD-10-CM | POA: Diagnosis not present

## 2018-07-28 DIAGNOSIS — I739 Peripheral vascular disease, unspecified: Secondary | ICD-10-CM | POA: Diagnosis not present

## 2018-07-28 DIAGNOSIS — D631 Anemia in chronic kidney disease: Secondary | ICD-10-CM | POA: Diagnosis not present

## 2018-07-28 DIAGNOSIS — T8249XA Other complication of vascular dialysis catheter, initial encounter: Secondary | ICD-10-CM | POA: Diagnosis not present

## 2018-07-28 DIAGNOSIS — I69954 Hemiplegia and hemiparesis following unspecified cerebrovascular disease affecting left non-dominant side: Secondary | ICD-10-CM | POA: Diagnosis not present

## 2018-07-28 DIAGNOSIS — E119 Type 2 diabetes mellitus without complications: Secondary | ICD-10-CM | POA: Diagnosis not present

## 2018-07-28 DIAGNOSIS — Z418 Encounter for other procedures for purposes other than remedying health state: Secondary | ICD-10-CM | POA: Diagnosis not present

## 2018-07-28 DIAGNOSIS — R1312 Dysphagia, oropharyngeal phase: Secondary | ICD-10-CM | POA: Diagnosis not present

## 2018-07-28 DIAGNOSIS — M24522 Contracture, left elbow: Secondary | ICD-10-CM | POA: Diagnosis not present

## 2018-07-28 DIAGNOSIS — M24542 Contracture, left hand: Secondary | ICD-10-CM | POA: Diagnosis not present

## 2018-07-28 DIAGNOSIS — F339 Major depressive disorder, recurrent, unspecified: Secondary | ICD-10-CM | POA: Diagnosis not present

## 2018-07-30 DIAGNOSIS — N186 End stage renal disease: Secondary | ICD-10-CM | POA: Diagnosis not present

## 2018-07-30 DIAGNOSIS — T8249XA Other complication of vascular dialysis catheter, initial encounter: Secondary | ICD-10-CM | POA: Diagnosis not present

## 2018-07-30 DIAGNOSIS — Z418 Encounter for other procedures for purposes other than remedying health state: Secondary | ICD-10-CM | POA: Diagnosis not present

## 2018-07-30 DIAGNOSIS — N2581 Secondary hyperparathyroidism of renal origin: Secondary | ICD-10-CM | POA: Diagnosis not present

## 2018-07-30 DIAGNOSIS — D631 Anemia in chronic kidney disease: Secondary | ICD-10-CM | POA: Diagnosis not present

## 2018-08-01 DIAGNOSIS — N186 End stage renal disease: Secondary | ICD-10-CM | POA: Diagnosis not present

## 2018-08-01 DIAGNOSIS — D631 Anemia in chronic kidney disease: Secondary | ICD-10-CM | POA: Diagnosis not present

## 2018-08-01 DIAGNOSIS — N2581 Secondary hyperparathyroidism of renal origin: Secondary | ICD-10-CM | POA: Diagnosis not present

## 2018-08-01 DIAGNOSIS — Z418 Encounter for other procedures for purposes other than remedying health state: Secondary | ICD-10-CM | POA: Diagnosis not present

## 2018-08-01 DIAGNOSIS — T8249XA Other complication of vascular dialysis catheter, initial encounter: Secondary | ICD-10-CM | POA: Diagnosis not present

## 2018-08-04 DIAGNOSIS — N186 End stage renal disease: Secondary | ICD-10-CM | POA: Diagnosis not present

## 2018-08-04 DIAGNOSIS — D631 Anemia in chronic kidney disease: Secondary | ICD-10-CM | POA: Diagnosis not present

## 2018-08-04 DIAGNOSIS — Z418 Encounter for other procedures for purposes other than remedying health state: Secondary | ICD-10-CM | POA: Diagnosis not present

## 2018-08-04 DIAGNOSIS — N2581 Secondary hyperparathyroidism of renal origin: Secondary | ICD-10-CM | POA: Diagnosis not present

## 2018-08-04 DIAGNOSIS — T8249XA Other complication of vascular dialysis catheter, initial encounter: Secondary | ICD-10-CM | POA: Diagnosis not present

## 2018-08-06 DIAGNOSIS — D631 Anemia in chronic kidney disease: Secondary | ICD-10-CM | POA: Diagnosis not present

## 2018-08-06 DIAGNOSIS — Z418 Encounter for other procedures for purposes other than remedying health state: Secondary | ICD-10-CM | POA: Diagnosis not present

## 2018-08-06 DIAGNOSIS — N186 End stage renal disease: Secondary | ICD-10-CM | POA: Diagnosis not present

## 2018-08-06 DIAGNOSIS — T8249XA Other complication of vascular dialysis catheter, initial encounter: Secondary | ICD-10-CM | POA: Diagnosis not present

## 2018-08-06 DIAGNOSIS — N2581 Secondary hyperparathyroidism of renal origin: Secondary | ICD-10-CM | POA: Diagnosis not present

## 2018-08-08 DIAGNOSIS — T8249XA Other complication of vascular dialysis catheter, initial encounter: Secondary | ICD-10-CM | POA: Diagnosis not present

## 2018-08-08 DIAGNOSIS — D631 Anemia in chronic kidney disease: Secondary | ICD-10-CM | POA: Diagnosis not present

## 2018-08-08 DIAGNOSIS — N186 End stage renal disease: Secondary | ICD-10-CM | POA: Diagnosis not present

## 2018-08-08 DIAGNOSIS — Z418 Encounter for other procedures for purposes other than remedying health state: Secondary | ICD-10-CM | POA: Diagnosis not present

## 2018-08-08 DIAGNOSIS — N2581 Secondary hyperparathyroidism of renal origin: Secondary | ICD-10-CM | POA: Diagnosis not present

## 2018-08-10 DIAGNOSIS — I10 Essential (primary) hypertension: Secondary | ICD-10-CM | POA: Diagnosis not present

## 2018-08-10 DIAGNOSIS — N186 End stage renal disease: Secondary | ICD-10-CM | POA: Diagnosis not present

## 2018-08-10 DIAGNOSIS — D631 Anemia in chronic kidney disease: Secondary | ICD-10-CM | POA: Diagnosis not present

## 2018-08-11 DIAGNOSIS — Z23 Encounter for immunization: Secondary | ICD-10-CM | POA: Diagnosis not present

## 2018-08-11 DIAGNOSIS — Z418 Encounter for other procedures for purposes other than remedying health state: Secondary | ICD-10-CM | POA: Diagnosis not present

## 2018-08-11 DIAGNOSIS — N2581 Secondary hyperparathyroidism of renal origin: Secondary | ICD-10-CM | POA: Diagnosis not present

## 2018-08-11 DIAGNOSIS — N186 End stage renal disease: Secondary | ICD-10-CM | POA: Diagnosis not present

## 2018-08-11 DIAGNOSIS — D631 Anemia in chronic kidney disease: Secondary | ICD-10-CM | POA: Diagnosis not present

## 2018-08-13 DIAGNOSIS — Z418 Encounter for other procedures for purposes other than remedying health state: Secondary | ICD-10-CM | POA: Diagnosis not present

## 2018-08-13 DIAGNOSIS — N2581 Secondary hyperparathyroidism of renal origin: Secondary | ICD-10-CM | POA: Diagnosis not present

## 2018-08-13 DIAGNOSIS — Z23 Encounter for immunization: Secondary | ICD-10-CM | POA: Diagnosis not present

## 2018-08-13 DIAGNOSIS — D631 Anemia in chronic kidney disease: Secondary | ICD-10-CM | POA: Diagnosis not present

## 2018-08-13 DIAGNOSIS — N186 End stage renal disease: Secondary | ICD-10-CM | POA: Diagnosis not present

## 2018-08-15 DIAGNOSIS — N2581 Secondary hyperparathyroidism of renal origin: Secondary | ICD-10-CM | POA: Diagnosis not present

## 2018-08-15 DIAGNOSIS — Z23 Encounter for immunization: Secondary | ICD-10-CM | POA: Diagnosis not present

## 2018-08-15 DIAGNOSIS — N186 End stage renal disease: Secondary | ICD-10-CM | POA: Diagnosis not present

## 2018-08-15 DIAGNOSIS — Z418 Encounter for other procedures for purposes other than remedying health state: Secondary | ICD-10-CM | POA: Diagnosis not present

## 2018-08-15 DIAGNOSIS — D631 Anemia in chronic kidney disease: Secondary | ICD-10-CM | POA: Diagnosis not present

## 2018-08-18 DIAGNOSIS — N186 End stage renal disease: Secondary | ICD-10-CM | POA: Diagnosis not present

## 2018-08-18 DIAGNOSIS — Z23 Encounter for immunization: Secondary | ICD-10-CM | POA: Diagnosis not present

## 2018-08-18 DIAGNOSIS — D631 Anemia in chronic kidney disease: Secondary | ICD-10-CM | POA: Diagnosis not present

## 2018-08-18 DIAGNOSIS — Z418 Encounter for other procedures for purposes other than remedying health state: Secondary | ICD-10-CM | POA: Diagnosis not present

## 2018-08-18 DIAGNOSIS — N2581 Secondary hyperparathyroidism of renal origin: Secondary | ICD-10-CM | POA: Diagnosis not present

## 2018-08-20 DIAGNOSIS — Z23 Encounter for immunization: Secondary | ICD-10-CM | POA: Diagnosis not present

## 2018-08-20 DIAGNOSIS — Z418 Encounter for other procedures for purposes other than remedying health state: Secondary | ICD-10-CM | POA: Diagnosis not present

## 2018-08-20 DIAGNOSIS — N2581 Secondary hyperparathyroidism of renal origin: Secondary | ICD-10-CM | POA: Diagnosis not present

## 2018-08-20 DIAGNOSIS — N186 End stage renal disease: Secondary | ICD-10-CM | POA: Diagnosis not present

## 2018-08-20 DIAGNOSIS — D631 Anemia in chronic kidney disease: Secondary | ICD-10-CM | POA: Diagnosis not present

## 2018-08-21 DIAGNOSIS — G40909 Epilepsy, unspecified, not intractable, without status epilepticus: Secondary | ICD-10-CM | POA: Diagnosis not present

## 2018-08-21 DIAGNOSIS — N186 End stage renal disease: Secondary | ICD-10-CM | POA: Diagnosis not present

## 2018-08-21 DIAGNOSIS — I69351 Hemiplegia and hemiparesis following cerebral infarction affecting right dominant side: Secondary | ICD-10-CM | POA: Diagnosis not present

## 2018-08-22 DIAGNOSIS — N2581 Secondary hyperparathyroidism of renal origin: Secondary | ICD-10-CM | POA: Diagnosis not present

## 2018-08-22 DIAGNOSIS — Z418 Encounter for other procedures for purposes other than remedying health state: Secondary | ICD-10-CM | POA: Diagnosis not present

## 2018-08-22 DIAGNOSIS — N186 End stage renal disease: Secondary | ICD-10-CM | POA: Diagnosis not present

## 2018-08-22 DIAGNOSIS — D631 Anemia in chronic kidney disease: Secondary | ICD-10-CM | POA: Diagnosis not present

## 2018-08-22 DIAGNOSIS — Z23 Encounter for immunization: Secondary | ICD-10-CM | POA: Diagnosis not present

## 2018-08-25 DIAGNOSIS — D631 Anemia in chronic kidney disease: Secondary | ICD-10-CM | POA: Diagnosis not present

## 2018-08-25 DIAGNOSIS — N2581 Secondary hyperparathyroidism of renal origin: Secondary | ICD-10-CM | POA: Diagnosis not present

## 2018-08-25 DIAGNOSIS — Z23 Encounter for immunization: Secondary | ICD-10-CM | POA: Diagnosis not present

## 2018-08-25 DIAGNOSIS — I69351 Hemiplegia and hemiparesis following cerebral infarction affecting right dominant side: Secondary | ICD-10-CM | POA: Diagnosis not present

## 2018-08-25 DIAGNOSIS — E119 Type 2 diabetes mellitus without complications: Secondary | ICD-10-CM | POA: Diagnosis not present

## 2018-08-25 DIAGNOSIS — N186 End stage renal disease: Secondary | ICD-10-CM | POA: Diagnosis not present

## 2018-08-25 DIAGNOSIS — I739 Peripheral vascular disease, unspecified: Secondary | ICD-10-CM | POA: Diagnosis not present

## 2018-08-25 DIAGNOSIS — Z418 Encounter for other procedures for purposes other than remedying health state: Secondary | ICD-10-CM | POA: Diagnosis not present

## 2018-08-27 DIAGNOSIS — D631 Anemia in chronic kidney disease: Secondary | ICD-10-CM | POA: Diagnosis not present

## 2018-08-27 DIAGNOSIS — Z418 Encounter for other procedures for purposes other than remedying health state: Secondary | ICD-10-CM | POA: Diagnosis not present

## 2018-08-27 DIAGNOSIS — N186 End stage renal disease: Secondary | ICD-10-CM | POA: Diagnosis not present

## 2018-08-27 DIAGNOSIS — N2581 Secondary hyperparathyroidism of renal origin: Secondary | ICD-10-CM | POA: Diagnosis not present

## 2018-08-27 DIAGNOSIS — Z23 Encounter for immunization: Secondary | ICD-10-CM | POA: Diagnosis not present

## 2018-08-29 DIAGNOSIS — N2581 Secondary hyperparathyroidism of renal origin: Secondary | ICD-10-CM | POA: Diagnosis not present

## 2018-08-29 DIAGNOSIS — D631 Anemia in chronic kidney disease: Secondary | ICD-10-CM | POA: Diagnosis not present

## 2018-08-29 DIAGNOSIS — Z418 Encounter for other procedures for purposes other than remedying health state: Secondary | ICD-10-CM | POA: Diagnosis not present

## 2018-08-29 DIAGNOSIS — Z23 Encounter for immunization: Secondary | ICD-10-CM | POA: Diagnosis not present

## 2018-08-29 DIAGNOSIS — N186 End stage renal disease: Secondary | ICD-10-CM | POA: Diagnosis not present

## 2018-09-01 DIAGNOSIS — Z418 Encounter for other procedures for purposes other than remedying health state: Secondary | ICD-10-CM | POA: Diagnosis not present

## 2018-09-01 DIAGNOSIS — N2581 Secondary hyperparathyroidism of renal origin: Secondary | ICD-10-CM | POA: Diagnosis not present

## 2018-09-01 DIAGNOSIS — Z23 Encounter for immunization: Secondary | ICD-10-CM | POA: Diagnosis not present

## 2018-09-01 DIAGNOSIS — N186 End stage renal disease: Secondary | ICD-10-CM | POA: Diagnosis not present

## 2018-09-01 DIAGNOSIS — D631 Anemia in chronic kidney disease: Secondary | ICD-10-CM | POA: Diagnosis not present

## 2018-09-03 DIAGNOSIS — D631 Anemia in chronic kidney disease: Secondary | ICD-10-CM | POA: Diagnosis not present

## 2018-09-03 DIAGNOSIS — Z23 Encounter for immunization: Secondary | ICD-10-CM | POA: Diagnosis not present

## 2018-09-03 DIAGNOSIS — N2581 Secondary hyperparathyroidism of renal origin: Secondary | ICD-10-CM | POA: Diagnosis not present

## 2018-09-03 DIAGNOSIS — N186 End stage renal disease: Secondary | ICD-10-CM | POA: Diagnosis not present

## 2018-09-03 DIAGNOSIS — Z418 Encounter for other procedures for purposes other than remedying health state: Secondary | ICD-10-CM | POA: Diagnosis not present

## 2018-09-05 DIAGNOSIS — D631 Anemia in chronic kidney disease: Secondary | ICD-10-CM | POA: Diagnosis not present

## 2018-09-05 DIAGNOSIS — Z23 Encounter for immunization: Secondary | ICD-10-CM | POA: Diagnosis not present

## 2018-09-05 DIAGNOSIS — N186 End stage renal disease: Secondary | ICD-10-CM | POA: Diagnosis not present

## 2018-09-05 DIAGNOSIS — Z418 Encounter for other procedures for purposes other than remedying health state: Secondary | ICD-10-CM | POA: Diagnosis not present

## 2018-09-05 DIAGNOSIS — N2581 Secondary hyperparathyroidism of renal origin: Secondary | ICD-10-CM | POA: Diagnosis not present

## 2018-09-08 DIAGNOSIS — D631 Anemia in chronic kidney disease: Secondary | ICD-10-CM | POA: Diagnosis not present

## 2018-09-08 DIAGNOSIS — Z23 Encounter for immunization: Secondary | ICD-10-CM | POA: Diagnosis not present

## 2018-09-08 DIAGNOSIS — N186 End stage renal disease: Secondary | ICD-10-CM | POA: Diagnosis not present

## 2018-09-08 DIAGNOSIS — Z418 Encounter for other procedures for purposes other than remedying health state: Secondary | ICD-10-CM | POA: Diagnosis not present

## 2018-09-08 DIAGNOSIS — N2581 Secondary hyperparathyroidism of renal origin: Secondary | ICD-10-CM | POA: Diagnosis not present

## 2018-09-10 DIAGNOSIS — D509 Iron deficiency anemia, unspecified: Secondary | ICD-10-CM | POA: Diagnosis not present

## 2018-09-10 DIAGNOSIS — N186 End stage renal disease: Secondary | ICD-10-CM | POA: Diagnosis not present

## 2018-09-10 DIAGNOSIS — D631 Anemia in chronic kidney disease: Secondary | ICD-10-CM | POA: Diagnosis not present

## 2018-09-10 DIAGNOSIS — N2581 Secondary hyperparathyroidism of renal origin: Secondary | ICD-10-CM | POA: Diagnosis not present

## 2018-09-10 DIAGNOSIS — I10 Essential (primary) hypertension: Secondary | ICD-10-CM | POA: Diagnosis not present

## 2018-09-10 DIAGNOSIS — Z418 Encounter for other procedures for purposes other than remedying health state: Secondary | ICD-10-CM | POA: Diagnosis not present

## 2018-09-12 DIAGNOSIS — D631 Anemia in chronic kidney disease: Secondary | ICD-10-CM | POA: Diagnosis not present

## 2018-09-12 DIAGNOSIS — N2581 Secondary hyperparathyroidism of renal origin: Secondary | ICD-10-CM | POA: Diagnosis not present

## 2018-09-12 DIAGNOSIS — N186 End stage renal disease: Secondary | ICD-10-CM | POA: Diagnosis not present

## 2018-09-12 DIAGNOSIS — I10 Essential (primary) hypertension: Secondary | ICD-10-CM | POA: Diagnosis not present

## 2018-09-12 DIAGNOSIS — D509 Iron deficiency anemia, unspecified: Secondary | ICD-10-CM | POA: Diagnosis not present

## 2018-09-12 DIAGNOSIS — Z418 Encounter for other procedures for purposes other than remedying health state: Secondary | ICD-10-CM | POA: Diagnosis not present

## 2018-09-15 DIAGNOSIS — Z418 Encounter for other procedures for purposes other than remedying health state: Secondary | ICD-10-CM | POA: Diagnosis not present

## 2018-09-15 DIAGNOSIS — N186 End stage renal disease: Secondary | ICD-10-CM | POA: Diagnosis not present

## 2018-09-15 DIAGNOSIS — N2581 Secondary hyperparathyroidism of renal origin: Secondary | ICD-10-CM | POA: Diagnosis not present

## 2018-09-15 DIAGNOSIS — I10 Essential (primary) hypertension: Secondary | ICD-10-CM | POA: Diagnosis not present

## 2018-09-15 DIAGNOSIS — D509 Iron deficiency anemia, unspecified: Secondary | ICD-10-CM | POA: Diagnosis not present

## 2018-09-15 DIAGNOSIS — D631 Anemia in chronic kidney disease: Secondary | ICD-10-CM | POA: Diagnosis not present

## 2018-09-17 DIAGNOSIS — N2581 Secondary hyperparathyroidism of renal origin: Secondary | ICD-10-CM | POA: Diagnosis not present

## 2018-09-17 DIAGNOSIS — D509 Iron deficiency anemia, unspecified: Secondary | ICD-10-CM | POA: Diagnosis not present

## 2018-09-17 DIAGNOSIS — I10 Essential (primary) hypertension: Secondary | ICD-10-CM | POA: Diagnosis not present

## 2018-09-17 DIAGNOSIS — D631 Anemia in chronic kidney disease: Secondary | ICD-10-CM | POA: Diagnosis not present

## 2018-09-17 DIAGNOSIS — Z418 Encounter for other procedures for purposes other than remedying health state: Secondary | ICD-10-CM | POA: Diagnosis not present

## 2018-09-17 DIAGNOSIS — N186 End stage renal disease: Secondary | ICD-10-CM | POA: Diagnosis not present

## 2018-09-19 DIAGNOSIS — D509 Iron deficiency anemia, unspecified: Secondary | ICD-10-CM | POA: Diagnosis not present

## 2018-09-19 DIAGNOSIS — I10 Essential (primary) hypertension: Secondary | ICD-10-CM | POA: Diagnosis not present

## 2018-09-19 DIAGNOSIS — N186 End stage renal disease: Secondary | ICD-10-CM | POA: Diagnosis not present

## 2018-09-19 DIAGNOSIS — Z418 Encounter for other procedures for purposes other than remedying health state: Secondary | ICD-10-CM | POA: Diagnosis not present

## 2018-09-19 DIAGNOSIS — D631 Anemia in chronic kidney disease: Secondary | ICD-10-CM | POA: Diagnosis not present

## 2018-09-19 DIAGNOSIS — N2581 Secondary hyperparathyroidism of renal origin: Secondary | ICD-10-CM | POA: Diagnosis not present

## 2018-09-22 DIAGNOSIS — I69351 Hemiplegia and hemiparesis following cerebral infarction affecting right dominant side: Secondary | ICD-10-CM | POA: Diagnosis not present

## 2018-09-22 DIAGNOSIS — E119 Type 2 diabetes mellitus without complications: Secondary | ICD-10-CM | POA: Diagnosis not present

## 2018-09-22 DIAGNOSIS — Z418 Encounter for other procedures for purposes other than remedying health state: Secondary | ICD-10-CM | POA: Diagnosis not present

## 2018-09-22 DIAGNOSIS — N186 End stage renal disease: Secondary | ICD-10-CM | POA: Diagnosis not present

## 2018-09-22 DIAGNOSIS — I10 Essential (primary) hypertension: Secondary | ICD-10-CM | POA: Diagnosis not present

## 2018-09-22 DIAGNOSIS — D631 Anemia in chronic kidney disease: Secondary | ICD-10-CM | POA: Diagnosis not present

## 2018-09-22 DIAGNOSIS — D509 Iron deficiency anemia, unspecified: Secondary | ICD-10-CM | POA: Diagnosis not present

## 2018-09-22 DIAGNOSIS — N2581 Secondary hyperparathyroidism of renal origin: Secondary | ICD-10-CM | POA: Diagnosis not present

## 2018-09-22 DIAGNOSIS — I739 Peripheral vascular disease, unspecified: Secondary | ICD-10-CM | POA: Diagnosis not present

## 2018-09-24 DIAGNOSIS — Z418 Encounter for other procedures for purposes other than remedying health state: Secondary | ICD-10-CM | POA: Diagnosis not present

## 2018-09-24 DIAGNOSIS — N2581 Secondary hyperparathyroidism of renal origin: Secondary | ICD-10-CM | POA: Diagnosis not present

## 2018-09-24 DIAGNOSIS — N186 End stage renal disease: Secondary | ICD-10-CM | POA: Diagnosis not present

## 2018-09-24 DIAGNOSIS — D631 Anemia in chronic kidney disease: Secondary | ICD-10-CM | POA: Diagnosis not present

## 2018-09-24 DIAGNOSIS — I10 Essential (primary) hypertension: Secondary | ICD-10-CM | POA: Diagnosis not present

## 2018-09-24 DIAGNOSIS — D509 Iron deficiency anemia, unspecified: Secondary | ICD-10-CM | POA: Diagnosis not present

## 2018-09-26 DIAGNOSIS — Z418 Encounter for other procedures for purposes other than remedying health state: Secondary | ICD-10-CM | POA: Diagnosis not present

## 2018-09-26 DIAGNOSIS — N2581 Secondary hyperparathyroidism of renal origin: Secondary | ICD-10-CM | POA: Diagnosis not present

## 2018-09-26 DIAGNOSIS — D631 Anemia in chronic kidney disease: Secondary | ICD-10-CM | POA: Diagnosis not present

## 2018-09-26 DIAGNOSIS — D509 Iron deficiency anemia, unspecified: Secondary | ICD-10-CM | POA: Diagnosis not present

## 2018-09-26 DIAGNOSIS — I10 Essential (primary) hypertension: Secondary | ICD-10-CM | POA: Diagnosis not present

## 2018-09-26 DIAGNOSIS — N186 End stage renal disease: Secondary | ICD-10-CM | POA: Diagnosis not present

## 2018-09-29 DIAGNOSIS — D509 Iron deficiency anemia, unspecified: Secondary | ICD-10-CM | POA: Diagnosis not present

## 2018-09-29 DIAGNOSIS — N186 End stage renal disease: Secondary | ICD-10-CM | POA: Diagnosis not present

## 2018-09-29 DIAGNOSIS — N2581 Secondary hyperparathyroidism of renal origin: Secondary | ICD-10-CM | POA: Diagnosis not present

## 2018-09-29 DIAGNOSIS — I10 Essential (primary) hypertension: Secondary | ICD-10-CM | POA: Diagnosis not present

## 2018-09-29 DIAGNOSIS — D631 Anemia in chronic kidney disease: Secondary | ICD-10-CM | POA: Diagnosis not present

## 2018-09-29 DIAGNOSIS — Z418 Encounter for other procedures for purposes other than remedying health state: Secondary | ICD-10-CM | POA: Diagnosis not present

## 2018-10-01 DIAGNOSIS — I5022 Chronic systolic (congestive) heart failure: Secondary | ICD-10-CM | POA: Diagnosis not present

## 2018-10-01 DIAGNOSIS — I10 Essential (primary) hypertension: Secondary | ICD-10-CM | POA: Diagnosis not present

## 2018-10-01 DIAGNOSIS — E119 Type 2 diabetes mellitus without complications: Secondary | ICD-10-CM | POA: Diagnosis not present

## 2018-10-01 DIAGNOSIS — I69351 Hemiplegia and hemiparesis following cerebral infarction affecting right dominant side: Secondary | ICD-10-CM | POA: Diagnosis not present

## 2018-10-01 DIAGNOSIS — N186 End stage renal disease: Secondary | ICD-10-CM | POA: Diagnosis not present

## 2018-10-01 DIAGNOSIS — D631 Anemia in chronic kidney disease: Secondary | ICD-10-CM | POA: Diagnosis not present

## 2018-10-01 DIAGNOSIS — Z418 Encounter for other procedures for purposes other than remedying health state: Secondary | ICD-10-CM | POA: Diagnosis not present

## 2018-10-01 DIAGNOSIS — N2581 Secondary hyperparathyroidism of renal origin: Secondary | ICD-10-CM | POA: Diagnosis not present

## 2018-10-01 DIAGNOSIS — D509 Iron deficiency anemia, unspecified: Secondary | ICD-10-CM | POA: Diagnosis not present

## 2018-10-02 DIAGNOSIS — E559 Vitamin D deficiency, unspecified: Secondary | ICD-10-CM | POA: Diagnosis not present

## 2018-10-02 DIAGNOSIS — I739 Peripheral vascular disease, unspecified: Secondary | ICD-10-CM | POA: Diagnosis not present

## 2018-10-02 DIAGNOSIS — I69954 Hemiplegia and hemiparesis following unspecified cerebrovascular disease affecting left non-dominant side: Secondary | ICD-10-CM | POA: Diagnosis not present

## 2018-10-02 DIAGNOSIS — I429 Cardiomyopathy, unspecified: Secondary | ICD-10-CM | POA: Diagnosis not present

## 2018-10-02 DIAGNOSIS — H548 Legal blindness, as defined in USA: Secondary | ICD-10-CM | POA: Diagnosis not present

## 2018-10-02 DIAGNOSIS — E1122 Type 2 diabetes mellitus with diabetic chronic kidney disease: Secondary | ICD-10-CM | POA: Diagnosis not present

## 2018-10-02 DIAGNOSIS — E785 Hyperlipidemia, unspecified: Secondary | ICD-10-CM | POA: Diagnosis not present

## 2018-10-02 DIAGNOSIS — R1312 Dysphagia, oropharyngeal phase: Secondary | ICD-10-CM | POA: Diagnosis not present

## 2018-10-02 DIAGNOSIS — M24542 Contracture, left hand: Secondary | ICD-10-CM | POA: Diagnosis not present

## 2018-10-02 DIAGNOSIS — K5900 Constipation, unspecified: Secondary | ICD-10-CM | POA: Diagnosis not present

## 2018-10-02 DIAGNOSIS — I509 Heart failure, unspecified: Secondary | ICD-10-CM | POA: Diagnosis not present

## 2018-10-02 DIAGNOSIS — I10 Essential (primary) hypertension: Secondary | ICD-10-CM | POA: Diagnosis not present

## 2018-10-02 DIAGNOSIS — Z89511 Acquired absence of right leg below knee: Secondary | ICD-10-CM | POA: Diagnosis not present

## 2018-10-02 DIAGNOSIS — M24522 Contracture, left elbow: Secondary | ICD-10-CM | POA: Diagnosis not present

## 2018-10-02 DIAGNOSIS — F339 Major depressive disorder, recurrent, unspecified: Secondary | ICD-10-CM | POA: Diagnosis not present

## 2018-10-02 DIAGNOSIS — Z89612 Acquired absence of left leg above knee: Secondary | ICD-10-CM | POA: Diagnosis not present

## 2018-10-02 DIAGNOSIS — E875 Hyperkalemia: Secondary | ICD-10-CM | POA: Diagnosis not present

## 2018-10-02 DIAGNOSIS — N186 End stage renal disease: Secondary | ICD-10-CM | POA: Diagnosis not present

## 2018-10-02 DIAGNOSIS — D631 Anemia in chronic kidney disease: Secondary | ICD-10-CM | POA: Diagnosis not present

## 2018-10-02 DIAGNOSIS — E1169 Type 2 diabetes mellitus with other specified complication: Secondary | ICD-10-CM | POA: Diagnosis not present

## 2018-10-02 DIAGNOSIS — I639 Cerebral infarction, unspecified: Secondary | ICD-10-CM | POA: Diagnosis not present

## 2018-10-02 DIAGNOSIS — R569 Unspecified convulsions: Secondary | ICD-10-CM | POA: Diagnosis not present

## 2018-10-02 DIAGNOSIS — Z992 Dependence on renal dialysis: Secondary | ICD-10-CM | POA: Diagnosis not present

## 2018-10-02 DIAGNOSIS — I482 Chronic atrial fibrillation, unspecified: Secondary | ICD-10-CM | POA: Diagnosis not present

## 2018-10-03 DIAGNOSIS — R1312 Dysphagia, oropharyngeal phase: Secondary | ICD-10-CM | POA: Diagnosis not present

## 2018-10-03 DIAGNOSIS — M24522 Contracture, left elbow: Secondary | ICD-10-CM | POA: Diagnosis not present

## 2018-10-03 DIAGNOSIS — M24542 Contracture, left hand: Secondary | ICD-10-CM | POA: Diagnosis not present

## 2018-10-03 DIAGNOSIS — D509 Iron deficiency anemia, unspecified: Secondary | ICD-10-CM | POA: Diagnosis not present

## 2018-10-03 DIAGNOSIS — D631 Anemia in chronic kidney disease: Secondary | ICD-10-CM | POA: Diagnosis not present

## 2018-10-03 DIAGNOSIS — N186 End stage renal disease: Secondary | ICD-10-CM | POA: Diagnosis not present

## 2018-10-03 DIAGNOSIS — Z418 Encounter for other procedures for purposes other than remedying health state: Secondary | ICD-10-CM | POA: Diagnosis not present

## 2018-10-03 DIAGNOSIS — N2581 Secondary hyperparathyroidism of renal origin: Secondary | ICD-10-CM | POA: Diagnosis not present

## 2018-10-03 DIAGNOSIS — I10 Essential (primary) hypertension: Secondary | ICD-10-CM | POA: Diagnosis not present

## 2018-10-03 DIAGNOSIS — I69954 Hemiplegia and hemiparesis following unspecified cerebrovascular disease affecting left non-dominant side: Secondary | ICD-10-CM | POA: Diagnosis not present

## 2018-10-03 DIAGNOSIS — I639 Cerebral infarction, unspecified: Secondary | ICD-10-CM | POA: Diagnosis not present

## 2018-10-03 DIAGNOSIS — F339 Major depressive disorder, recurrent, unspecified: Secondary | ICD-10-CM | POA: Diagnosis not present

## 2018-10-06 DIAGNOSIS — Z418 Encounter for other procedures for purposes other than remedying health state: Secondary | ICD-10-CM | POA: Diagnosis not present

## 2018-10-06 DIAGNOSIS — D631 Anemia in chronic kidney disease: Secondary | ICD-10-CM | POA: Diagnosis not present

## 2018-10-06 DIAGNOSIS — I10 Essential (primary) hypertension: Secondary | ICD-10-CM | POA: Diagnosis not present

## 2018-10-06 DIAGNOSIS — N2581 Secondary hyperparathyroidism of renal origin: Secondary | ICD-10-CM | POA: Diagnosis not present

## 2018-10-06 DIAGNOSIS — R1312 Dysphagia, oropharyngeal phase: Secondary | ICD-10-CM | POA: Diagnosis not present

## 2018-10-06 DIAGNOSIS — I639 Cerebral infarction, unspecified: Secondary | ICD-10-CM | POA: Diagnosis not present

## 2018-10-06 DIAGNOSIS — M24522 Contracture, left elbow: Secondary | ICD-10-CM | POA: Diagnosis not present

## 2018-10-06 DIAGNOSIS — D509 Iron deficiency anemia, unspecified: Secondary | ICD-10-CM | POA: Diagnosis not present

## 2018-10-06 DIAGNOSIS — M24542 Contracture, left hand: Secondary | ICD-10-CM | POA: Diagnosis not present

## 2018-10-06 DIAGNOSIS — I69954 Hemiplegia and hemiparesis following unspecified cerebrovascular disease affecting left non-dominant side: Secondary | ICD-10-CM | POA: Diagnosis not present

## 2018-10-06 DIAGNOSIS — N186 End stage renal disease: Secondary | ICD-10-CM | POA: Diagnosis not present

## 2018-10-06 DIAGNOSIS — F339 Major depressive disorder, recurrent, unspecified: Secondary | ICD-10-CM | POA: Diagnosis not present

## 2018-10-07 DIAGNOSIS — I639 Cerebral infarction, unspecified: Secondary | ICD-10-CM | POA: Diagnosis not present

## 2018-10-07 DIAGNOSIS — R1312 Dysphagia, oropharyngeal phase: Secondary | ICD-10-CM | POA: Diagnosis not present

## 2018-10-07 DIAGNOSIS — M24522 Contracture, left elbow: Secondary | ICD-10-CM | POA: Diagnosis not present

## 2018-10-07 DIAGNOSIS — M24542 Contracture, left hand: Secondary | ICD-10-CM | POA: Diagnosis not present

## 2018-10-07 DIAGNOSIS — F339 Major depressive disorder, recurrent, unspecified: Secondary | ICD-10-CM | POA: Diagnosis not present

## 2018-10-07 DIAGNOSIS — I69954 Hemiplegia and hemiparesis following unspecified cerebrovascular disease affecting left non-dominant side: Secondary | ICD-10-CM | POA: Diagnosis not present

## 2018-10-08 DIAGNOSIS — N2581 Secondary hyperparathyroidism of renal origin: Secondary | ICD-10-CM | POA: Diagnosis not present

## 2018-10-08 DIAGNOSIS — R1312 Dysphagia, oropharyngeal phase: Secondary | ICD-10-CM | POA: Diagnosis not present

## 2018-10-08 DIAGNOSIS — D509 Iron deficiency anemia, unspecified: Secondary | ICD-10-CM | POA: Diagnosis not present

## 2018-10-08 DIAGNOSIS — I69954 Hemiplegia and hemiparesis following unspecified cerebrovascular disease affecting left non-dominant side: Secondary | ICD-10-CM | POA: Diagnosis not present

## 2018-10-08 DIAGNOSIS — N186 End stage renal disease: Secondary | ICD-10-CM | POA: Diagnosis not present

## 2018-10-08 DIAGNOSIS — F339 Major depressive disorder, recurrent, unspecified: Secondary | ICD-10-CM | POA: Diagnosis not present

## 2018-10-08 DIAGNOSIS — Z418 Encounter for other procedures for purposes other than remedying health state: Secondary | ICD-10-CM | POA: Diagnosis not present

## 2018-10-08 DIAGNOSIS — I639 Cerebral infarction, unspecified: Secondary | ICD-10-CM | POA: Diagnosis not present

## 2018-10-08 DIAGNOSIS — M24522 Contracture, left elbow: Secondary | ICD-10-CM | POA: Diagnosis not present

## 2018-10-08 DIAGNOSIS — D631 Anemia in chronic kidney disease: Secondary | ICD-10-CM | POA: Diagnosis not present

## 2018-10-08 DIAGNOSIS — M24542 Contracture, left hand: Secondary | ICD-10-CM | POA: Diagnosis not present

## 2018-10-08 DIAGNOSIS — I10 Essential (primary) hypertension: Secondary | ICD-10-CM | POA: Diagnosis not present

## 2018-10-09 DIAGNOSIS — M24542 Contracture, left hand: Secondary | ICD-10-CM | POA: Diagnosis not present

## 2018-10-09 DIAGNOSIS — I639 Cerebral infarction, unspecified: Secondary | ICD-10-CM | POA: Diagnosis not present

## 2018-10-09 DIAGNOSIS — F339 Major depressive disorder, recurrent, unspecified: Secondary | ICD-10-CM | POA: Diagnosis not present

## 2018-10-09 DIAGNOSIS — R1312 Dysphagia, oropharyngeal phase: Secondary | ICD-10-CM | POA: Diagnosis not present

## 2018-10-09 DIAGNOSIS — I69954 Hemiplegia and hemiparesis following unspecified cerebrovascular disease affecting left non-dominant side: Secondary | ICD-10-CM | POA: Diagnosis not present

## 2018-10-09 DIAGNOSIS — M24522 Contracture, left elbow: Secondary | ICD-10-CM | POA: Diagnosis not present

## 2018-10-10 DIAGNOSIS — Z89612 Acquired absence of left leg above knee: Secondary | ICD-10-CM | POA: Diagnosis not present

## 2018-10-10 DIAGNOSIS — F339 Major depressive disorder, recurrent, unspecified: Secondary | ICD-10-CM | POA: Diagnosis not present

## 2018-10-10 DIAGNOSIS — I739 Peripheral vascular disease, unspecified: Secondary | ICD-10-CM | POA: Diagnosis not present

## 2018-10-10 DIAGNOSIS — I482 Chronic atrial fibrillation, unspecified: Secondary | ICD-10-CM | POA: Diagnosis not present

## 2018-10-10 DIAGNOSIS — E559 Vitamin D deficiency, unspecified: Secondary | ICD-10-CM | POA: Diagnosis not present

## 2018-10-10 DIAGNOSIS — Z89511 Acquired absence of right leg below knee: Secondary | ICD-10-CM | POA: Diagnosis not present

## 2018-10-10 DIAGNOSIS — M24542 Contracture, left hand: Secondary | ICD-10-CM | POA: Diagnosis not present

## 2018-10-10 DIAGNOSIS — I69954 Hemiplegia and hemiparesis following unspecified cerebrovascular disease affecting left non-dominant side: Secondary | ICD-10-CM | POA: Diagnosis not present

## 2018-10-10 DIAGNOSIS — Z418 Encounter for other procedures for purposes other than remedying health state: Secondary | ICD-10-CM | POA: Diagnosis not present

## 2018-10-10 DIAGNOSIS — K5900 Constipation, unspecified: Secondary | ICD-10-CM | POA: Diagnosis not present

## 2018-10-10 DIAGNOSIS — D631 Anemia in chronic kidney disease: Secondary | ICD-10-CM | POA: Diagnosis not present

## 2018-10-10 DIAGNOSIS — I10 Essential (primary) hypertension: Secondary | ICD-10-CM | POA: Diagnosis not present

## 2018-10-10 DIAGNOSIS — E1169 Type 2 diabetes mellitus with other specified complication: Secondary | ICD-10-CM | POA: Diagnosis not present

## 2018-10-10 DIAGNOSIS — M24522 Contracture, left elbow: Secondary | ICD-10-CM | POA: Diagnosis not present

## 2018-10-10 DIAGNOSIS — I509 Heart failure, unspecified: Secondary | ICD-10-CM | POA: Diagnosis not present

## 2018-10-10 DIAGNOSIS — I639 Cerebral infarction, unspecified: Secondary | ICD-10-CM | POA: Diagnosis not present

## 2018-10-10 DIAGNOSIS — N2581 Secondary hyperparathyroidism of renal origin: Secondary | ICD-10-CM | POA: Diagnosis not present

## 2018-10-10 DIAGNOSIS — H548 Legal blindness, as defined in USA: Secondary | ICD-10-CM | POA: Diagnosis not present

## 2018-10-10 DIAGNOSIS — R1312 Dysphagia, oropharyngeal phase: Secondary | ICD-10-CM | POA: Diagnosis not present

## 2018-10-10 DIAGNOSIS — R569 Unspecified convulsions: Secondary | ICD-10-CM | POA: Diagnosis not present

## 2018-10-10 DIAGNOSIS — Z992 Dependence on renal dialysis: Secondary | ICD-10-CM | POA: Diagnosis not present

## 2018-10-10 DIAGNOSIS — E785 Hyperlipidemia, unspecified: Secondary | ICD-10-CM | POA: Diagnosis not present

## 2018-10-10 DIAGNOSIS — N186 End stage renal disease: Secondary | ICD-10-CM | POA: Diagnosis not present

## 2018-10-10 DIAGNOSIS — I429 Cardiomyopathy, unspecified: Secondary | ICD-10-CM | POA: Diagnosis not present

## 2018-10-10 DIAGNOSIS — E875 Hyperkalemia: Secondary | ICD-10-CM | POA: Diagnosis not present

## 2018-10-10 DIAGNOSIS — E1122 Type 2 diabetes mellitus with diabetic chronic kidney disease: Secondary | ICD-10-CM | POA: Diagnosis not present

## 2018-10-12 DIAGNOSIS — F339 Major depressive disorder, recurrent, unspecified: Secondary | ICD-10-CM | POA: Diagnosis not present

## 2018-10-12 DIAGNOSIS — M24542 Contracture, left hand: Secondary | ICD-10-CM | POA: Diagnosis not present

## 2018-10-12 DIAGNOSIS — I69954 Hemiplegia and hemiparesis following unspecified cerebrovascular disease affecting left non-dominant side: Secondary | ICD-10-CM | POA: Diagnosis not present

## 2018-10-12 DIAGNOSIS — R1312 Dysphagia, oropharyngeal phase: Secondary | ICD-10-CM | POA: Diagnosis not present

## 2018-10-12 DIAGNOSIS — I639 Cerebral infarction, unspecified: Secondary | ICD-10-CM | POA: Diagnosis not present

## 2018-10-12 DIAGNOSIS — M24522 Contracture, left elbow: Secondary | ICD-10-CM | POA: Diagnosis not present

## 2018-10-13 DIAGNOSIS — Z418 Encounter for other procedures for purposes other than remedying health state: Secondary | ICD-10-CM | POA: Diagnosis not present

## 2018-10-13 DIAGNOSIS — N186 End stage renal disease: Secondary | ICD-10-CM | POA: Diagnosis not present

## 2018-10-13 DIAGNOSIS — D631 Anemia in chronic kidney disease: Secondary | ICD-10-CM | POA: Diagnosis not present

## 2018-10-13 DIAGNOSIS — N2581 Secondary hyperparathyroidism of renal origin: Secondary | ICD-10-CM | POA: Diagnosis not present

## 2018-10-14 DIAGNOSIS — I639 Cerebral infarction, unspecified: Secondary | ICD-10-CM | POA: Diagnosis not present

## 2018-10-14 DIAGNOSIS — F339 Major depressive disorder, recurrent, unspecified: Secondary | ICD-10-CM | POA: Diagnosis not present

## 2018-10-14 DIAGNOSIS — M24542 Contracture, left hand: Secondary | ICD-10-CM | POA: Diagnosis not present

## 2018-10-14 DIAGNOSIS — R1312 Dysphagia, oropharyngeal phase: Secondary | ICD-10-CM | POA: Diagnosis not present

## 2018-10-14 DIAGNOSIS — I69954 Hemiplegia and hemiparesis following unspecified cerebrovascular disease affecting left non-dominant side: Secondary | ICD-10-CM | POA: Diagnosis not present

## 2018-10-14 DIAGNOSIS — M24522 Contracture, left elbow: Secondary | ICD-10-CM | POA: Diagnosis not present

## 2018-10-15 DIAGNOSIS — I69954 Hemiplegia and hemiparesis following unspecified cerebrovascular disease affecting left non-dominant side: Secondary | ICD-10-CM | POA: Diagnosis not present

## 2018-10-15 DIAGNOSIS — I639 Cerebral infarction, unspecified: Secondary | ICD-10-CM | POA: Diagnosis not present

## 2018-10-15 DIAGNOSIS — R1312 Dysphagia, oropharyngeal phase: Secondary | ICD-10-CM | POA: Diagnosis not present

## 2018-10-15 DIAGNOSIS — F339 Major depressive disorder, recurrent, unspecified: Secondary | ICD-10-CM | POA: Diagnosis not present

## 2018-10-15 DIAGNOSIS — N186 End stage renal disease: Secondary | ICD-10-CM | POA: Diagnosis not present

## 2018-10-15 DIAGNOSIS — D631 Anemia in chronic kidney disease: Secondary | ICD-10-CM | POA: Diagnosis not present

## 2018-10-15 DIAGNOSIS — M24522 Contracture, left elbow: Secondary | ICD-10-CM | POA: Diagnosis not present

## 2018-10-15 DIAGNOSIS — N2581 Secondary hyperparathyroidism of renal origin: Secondary | ICD-10-CM | POA: Diagnosis not present

## 2018-10-15 DIAGNOSIS — Z418 Encounter for other procedures for purposes other than remedying health state: Secondary | ICD-10-CM | POA: Diagnosis not present

## 2018-10-15 DIAGNOSIS — M24542 Contracture, left hand: Secondary | ICD-10-CM | POA: Diagnosis not present

## 2018-10-16 DIAGNOSIS — I69954 Hemiplegia and hemiparesis following unspecified cerebrovascular disease affecting left non-dominant side: Secondary | ICD-10-CM | POA: Diagnosis not present

## 2018-10-16 DIAGNOSIS — I639 Cerebral infarction, unspecified: Secondary | ICD-10-CM | POA: Diagnosis not present

## 2018-10-16 DIAGNOSIS — M24542 Contracture, left hand: Secondary | ICD-10-CM | POA: Diagnosis not present

## 2018-10-16 DIAGNOSIS — M24522 Contracture, left elbow: Secondary | ICD-10-CM | POA: Diagnosis not present

## 2018-10-16 DIAGNOSIS — F339 Major depressive disorder, recurrent, unspecified: Secondary | ICD-10-CM | POA: Diagnosis not present

## 2018-10-16 DIAGNOSIS — R1312 Dysphagia, oropharyngeal phase: Secondary | ICD-10-CM | POA: Diagnosis not present

## 2018-10-17 DIAGNOSIS — I69954 Hemiplegia and hemiparesis following unspecified cerebrovascular disease affecting left non-dominant side: Secondary | ICD-10-CM | POA: Diagnosis not present

## 2018-10-17 DIAGNOSIS — D631 Anemia in chronic kidney disease: Secondary | ICD-10-CM | POA: Diagnosis not present

## 2018-10-17 DIAGNOSIS — R1312 Dysphagia, oropharyngeal phase: Secondary | ICD-10-CM | POA: Diagnosis not present

## 2018-10-17 DIAGNOSIS — I639 Cerebral infarction, unspecified: Secondary | ICD-10-CM | POA: Diagnosis not present

## 2018-10-17 DIAGNOSIS — N2581 Secondary hyperparathyroidism of renal origin: Secondary | ICD-10-CM | POA: Diagnosis not present

## 2018-10-17 DIAGNOSIS — N186 End stage renal disease: Secondary | ICD-10-CM | POA: Diagnosis not present

## 2018-10-17 DIAGNOSIS — Z418 Encounter for other procedures for purposes other than remedying health state: Secondary | ICD-10-CM | POA: Diagnosis not present

## 2018-10-17 DIAGNOSIS — M24542 Contracture, left hand: Secondary | ICD-10-CM | POA: Diagnosis not present

## 2018-10-17 DIAGNOSIS — F339 Major depressive disorder, recurrent, unspecified: Secondary | ICD-10-CM | POA: Diagnosis not present

## 2018-10-17 DIAGNOSIS — M24522 Contracture, left elbow: Secondary | ICD-10-CM | POA: Diagnosis not present

## 2018-10-20 DIAGNOSIS — N185 Chronic kidney disease, stage 5: Secondary | ICD-10-CM | POA: Diagnosis not present

## 2018-10-20 DIAGNOSIS — I639 Cerebral infarction, unspecified: Secondary | ICD-10-CM | POA: Diagnosis not present

## 2018-10-20 DIAGNOSIS — N186 End stage renal disease: Secondary | ICD-10-CM | POA: Diagnosis not present

## 2018-10-20 DIAGNOSIS — I69351 Hemiplegia and hemiparesis following cerebral infarction affecting right dominant side: Secondary | ICD-10-CM | POA: Diagnosis not present

## 2018-10-20 DIAGNOSIS — I5022 Chronic systolic (congestive) heart failure: Secondary | ICD-10-CM | POA: Diagnosis not present

## 2018-10-20 DIAGNOSIS — N2581 Secondary hyperparathyroidism of renal origin: Secondary | ICD-10-CM | POA: Diagnosis not present

## 2018-10-20 DIAGNOSIS — I69954 Hemiplegia and hemiparesis following unspecified cerebrovascular disease affecting left non-dominant side: Secondary | ICD-10-CM | POA: Diagnosis not present

## 2018-10-20 DIAGNOSIS — E119 Type 2 diabetes mellitus without complications: Secondary | ICD-10-CM | POA: Diagnosis not present

## 2018-10-20 DIAGNOSIS — F339 Major depressive disorder, recurrent, unspecified: Secondary | ICD-10-CM | POA: Diagnosis not present

## 2018-10-20 DIAGNOSIS — Z418 Encounter for other procedures for purposes other than remedying health state: Secondary | ICD-10-CM | POA: Diagnosis not present

## 2018-10-20 DIAGNOSIS — M24542 Contracture, left hand: Secondary | ICD-10-CM | POA: Diagnosis not present

## 2018-10-20 DIAGNOSIS — M24522 Contracture, left elbow: Secondary | ICD-10-CM | POA: Diagnosis not present

## 2018-10-20 DIAGNOSIS — R1312 Dysphagia, oropharyngeal phase: Secondary | ICD-10-CM | POA: Diagnosis not present

## 2018-10-20 DIAGNOSIS — D631 Anemia in chronic kidney disease: Secondary | ICD-10-CM | POA: Diagnosis not present

## 2018-10-21 DIAGNOSIS — F339 Major depressive disorder, recurrent, unspecified: Secondary | ICD-10-CM | POA: Diagnosis not present

## 2018-10-21 DIAGNOSIS — M24522 Contracture, left elbow: Secondary | ICD-10-CM | POA: Diagnosis not present

## 2018-10-21 DIAGNOSIS — I639 Cerebral infarction, unspecified: Secondary | ICD-10-CM | POA: Diagnosis not present

## 2018-10-21 DIAGNOSIS — M24542 Contracture, left hand: Secondary | ICD-10-CM | POA: Diagnosis not present

## 2018-10-21 DIAGNOSIS — R1312 Dysphagia, oropharyngeal phase: Secondary | ICD-10-CM | POA: Diagnosis not present

## 2018-10-21 DIAGNOSIS — I69954 Hemiplegia and hemiparesis following unspecified cerebrovascular disease affecting left non-dominant side: Secondary | ICD-10-CM | POA: Diagnosis not present

## 2018-10-22 DIAGNOSIS — E1122 Type 2 diabetes mellitus with diabetic chronic kidney disease: Secondary | ICD-10-CM | POA: Diagnosis not present

## 2018-10-22 DIAGNOSIS — Z4901 Encounter for fitting and adjustment of extracorporeal dialysis catheter: Secondary | ICD-10-CM | POA: Diagnosis not present

## 2018-10-22 DIAGNOSIS — N186 End stage renal disease: Secondary | ICD-10-CM | POA: Diagnosis not present

## 2018-10-22 DIAGNOSIS — D631 Anemia in chronic kidney disease: Secondary | ICD-10-CM | POA: Diagnosis not present

## 2018-10-22 DIAGNOSIS — M24522 Contracture, left elbow: Secondary | ICD-10-CM | POA: Diagnosis not present

## 2018-10-22 DIAGNOSIS — F339 Major depressive disorder, recurrent, unspecified: Secondary | ICD-10-CM | POA: Diagnosis not present

## 2018-10-22 DIAGNOSIS — I639 Cerebral infarction, unspecified: Secondary | ICD-10-CM | POA: Diagnosis not present

## 2018-10-22 DIAGNOSIS — Z418 Encounter for other procedures for purposes other than remedying health state: Secondary | ICD-10-CM | POA: Diagnosis not present

## 2018-10-22 DIAGNOSIS — I132 Hypertensive heart and chronic kidney disease with heart failure and with stage 5 chronic kidney disease, or end stage renal disease: Secondary | ICD-10-CM | POA: Diagnosis not present

## 2018-10-22 DIAGNOSIS — T8241XA Breakdown (mechanical) of vascular dialysis catheter, initial encounter: Secondary | ICD-10-CM | POA: Diagnosis not present

## 2018-10-22 DIAGNOSIS — Z1159 Encounter for screening for other viral diseases: Secondary | ICD-10-CM | POA: Diagnosis not present

## 2018-10-22 DIAGNOSIS — I509 Heart failure, unspecified: Secondary | ICD-10-CM | POA: Diagnosis not present

## 2018-10-22 DIAGNOSIS — Z992 Dependence on renal dialysis: Secondary | ICD-10-CM | POA: Diagnosis not present

## 2018-10-22 DIAGNOSIS — N2581 Secondary hyperparathyroidism of renal origin: Secondary | ICD-10-CM | POA: Diagnosis not present

## 2018-10-22 DIAGNOSIS — R1312 Dysphagia, oropharyngeal phase: Secondary | ICD-10-CM | POA: Diagnosis not present

## 2018-10-22 DIAGNOSIS — M24542 Contracture, left hand: Secondary | ICD-10-CM | POA: Diagnosis not present

## 2018-10-22 DIAGNOSIS — I69954 Hemiplegia and hemiparesis following unspecified cerebrovascular disease affecting left non-dominant side: Secondary | ICD-10-CM | POA: Diagnosis not present

## 2018-10-22 DIAGNOSIS — T82898A Other specified complication of vascular prosthetic devices, implants and grafts, initial encounter: Secondary | ICD-10-CM | POA: Diagnosis not present

## 2018-10-23 DIAGNOSIS — I69954 Hemiplegia and hemiparesis following unspecified cerebrovascular disease affecting left non-dominant side: Secondary | ICD-10-CM | POA: Diagnosis not present

## 2018-10-23 DIAGNOSIS — I639 Cerebral infarction, unspecified: Secondary | ICD-10-CM | POA: Diagnosis not present

## 2018-10-23 DIAGNOSIS — M24542 Contracture, left hand: Secondary | ICD-10-CM | POA: Diagnosis not present

## 2018-10-23 DIAGNOSIS — M24522 Contracture, left elbow: Secondary | ICD-10-CM | POA: Diagnosis not present

## 2018-10-23 DIAGNOSIS — R1312 Dysphagia, oropharyngeal phase: Secondary | ICD-10-CM | POA: Diagnosis not present

## 2018-10-23 DIAGNOSIS — F339 Major depressive disorder, recurrent, unspecified: Secondary | ICD-10-CM | POA: Diagnosis not present

## 2018-10-24 DIAGNOSIS — I69954 Hemiplegia and hemiparesis following unspecified cerebrovascular disease affecting left non-dominant side: Secondary | ICD-10-CM | POA: Diagnosis not present

## 2018-10-24 DIAGNOSIS — I639 Cerebral infarction, unspecified: Secondary | ICD-10-CM | POA: Diagnosis not present

## 2018-10-24 DIAGNOSIS — F339 Major depressive disorder, recurrent, unspecified: Secondary | ICD-10-CM | POA: Diagnosis not present

## 2018-10-24 DIAGNOSIS — M24522 Contracture, left elbow: Secondary | ICD-10-CM | POA: Diagnosis not present

## 2018-10-24 DIAGNOSIS — D631 Anemia in chronic kidney disease: Secondary | ICD-10-CM | POA: Diagnosis not present

## 2018-10-24 DIAGNOSIS — R1312 Dysphagia, oropharyngeal phase: Secondary | ICD-10-CM | POA: Diagnosis not present

## 2018-10-24 DIAGNOSIS — M24542 Contracture, left hand: Secondary | ICD-10-CM | POA: Diagnosis not present

## 2018-10-24 DIAGNOSIS — N2581 Secondary hyperparathyroidism of renal origin: Secondary | ICD-10-CM | POA: Diagnosis not present

## 2018-10-24 DIAGNOSIS — N186 End stage renal disease: Secondary | ICD-10-CM | POA: Diagnosis not present

## 2018-10-24 DIAGNOSIS — Z418 Encounter for other procedures for purposes other than remedying health state: Secondary | ICD-10-CM | POA: Diagnosis not present

## 2018-10-27 DIAGNOSIS — I69954 Hemiplegia and hemiparesis following unspecified cerebrovascular disease affecting left non-dominant side: Secondary | ICD-10-CM | POA: Diagnosis not present

## 2018-10-27 DIAGNOSIS — I639 Cerebral infarction, unspecified: Secondary | ICD-10-CM | POA: Diagnosis not present

## 2018-10-27 DIAGNOSIS — M24542 Contracture, left hand: Secondary | ICD-10-CM | POA: Diagnosis not present

## 2018-10-27 DIAGNOSIS — R1312 Dysphagia, oropharyngeal phase: Secondary | ICD-10-CM | POA: Diagnosis not present

## 2018-10-27 DIAGNOSIS — Z418 Encounter for other procedures for purposes other than remedying health state: Secondary | ICD-10-CM | POA: Diagnosis not present

## 2018-10-27 DIAGNOSIS — M24522 Contracture, left elbow: Secondary | ICD-10-CM | POA: Diagnosis not present

## 2018-10-27 DIAGNOSIS — D631 Anemia in chronic kidney disease: Secondary | ICD-10-CM | POA: Diagnosis not present

## 2018-10-27 DIAGNOSIS — F339 Major depressive disorder, recurrent, unspecified: Secondary | ICD-10-CM | POA: Diagnosis not present

## 2018-10-27 DIAGNOSIS — N186 End stage renal disease: Secondary | ICD-10-CM | POA: Diagnosis not present

## 2018-10-27 DIAGNOSIS — N2581 Secondary hyperparathyroidism of renal origin: Secondary | ICD-10-CM | POA: Diagnosis not present

## 2018-10-28 DIAGNOSIS — I69954 Hemiplegia and hemiparesis following unspecified cerebrovascular disease affecting left non-dominant side: Secondary | ICD-10-CM | POA: Diagnosis not present

## 2018-10-28 DIAGNOSIS — R1312 Dysphagia, oropharyngeal phase: Secondary | ICD-10-CM | POA: Diagnosis not present

## 2018-10-28 DIAGNOSIS — M24542 Contracture, left hand: Secondary | ICD-10-CM | POA: Diagnosis not present

## 2018-10-28 DIAGNOSIS — M24522 Contracture, left elbow: Secondary | ICD-10-CM | POA: Diagnosis not present

## 2018-10-28 DIAGNOSIS — F339 Major depressive disorder, recurrent, unspecified: Secondary | ICD-10-CM | POA: Diagnosis not present

## 2018-10-28 DIAGNOSIS — I639 Cerebral infarction, unspecified: Secondary | ICD-10-CM | POA: Diagnosis not present

## 2018-10-29 DIAGNOSIS — F339 Major depressive disorder, recurrent, unspecified: Secondary | ICD-10-CM | POA: Diagnosis not present

## 2018-10-29 DIAGNOSIS — R1312 Dysphagia, oropharyngeal phase: Secondary | ICD-10-CM | POA: Diagnosis not present

## 2018-10-29 DIAGNOSIS — N2581 Secondary hyperparathyroidism of renal origin: Secondary | ICD-10-CM | POA: Diagnosis not present

## 2018-10-29 DIAGNOSIS — D631 Anemia in chronic kidney disease: Secondary | ICD-10-CM | POA: Diagnosis not present

## 2018-10-29 DIAGNOSIS — M24542 Contracture, left hand: Secondary | ICD-10-CM | POA: Diagnosis not present

## 2018-10-29 DIAGNOSIS — N186 End stage renal disease: Secondary | ICD-10-CM | POA: Diagnosis not present

## 2018-10-29 DIAGNOSIS — I639 Cerebral infarction, unspecified: Secondary | ICD-10-CM | POA: Diagnosis not present

## 2018-10-29 DIAGNOSIS — M24522 Contracture, left elbow: Secondary | ICD-10-CM | POA: Diagnosis not present

## 2018-10-29 DIAGNOSIS — Z418 Encounter for other procedures for purposes other than remedying health state: Secondary | ICD-10-CM | POA: Diagnosis not present

## 2018-10-29 DIAGNOSIS — I69954 Hemiplegia and hemiparesis following unspecified cerebrovascular disease affecting left non-dominant side: Secondary | ICD-10-CM | POA: Diagnosis not present

## 2018-10-30 DIAGNOSIS — I639 Cerebral infarction, unspecified: Secondary | ICD-10-CM | POA: Diagnosis not present

## 2018-10-30 DIAGNOSIS — I69954 Hemiplegia and hemiparesis following unspecified cerebrovascular disease affecting left non-dominant side: Secondary | ICD-10-CM | POA: Diagnosis not present

## 2018-10-30 DIAGNOSIS — M24522 Contracture, left elbow: Secondary | ICD-10-CM | POA: Diagnosis not present

## 2018-10-30 DIAGNOSIS — M24542 Contracture, left hand: Secondary | ICD-10-CM | POA: Diagnosis not present

## 2018-10-30 DIAGNOSIS — F339 Major depressive disorder, recurrent, unspecified: Secondary | ICD-10-CM | POA: Diagnosis not present

## 2018-10-30 DIAGNOSIS — R1312 Dysphagia, oropharyngeal phase: Secondary | ICD-10-CM | POA: Diagnosis not present

## 2018-10-31 DIAGNOSIS — Z418 Encounter for other procedures for purposes other than remedying health state: Secondary | ICD-10-CM | POA: Diagnosis not present

## 2018-10-31 DIAGNOSIS — N186 End stage renal disease: Secondary | ICD-10-CM | POA: Diagnosis not present

## 2018-10-31 DIAGNOSIS — N2581 Secondary hyperparathyroidism of renal origin: Secondary | ICD-10-CM | POA: Diagnosis not present

## 2018-10-31 DIAGNOSIS — D631 Anemia in chronic kidney disease: Secondary | ICD-10-CM | POA: Diagnosis not present

## 2018-11-03 DIAGNOSIS — N186 End stage renal disease: Secondary | ICD-10-CM | POA: Diagnosis not present

## 2018-11-03 DIAGNOSIS — D631 Anemia in chronic kidney disease: Secondary | ICD-10-CM | POA: Diagnosis not present

## 2018-11-03 DIAGNOSIS — N2581 Secondary hyperparathyroidism of renal origin: Secondary | ICD-10-CM | POA: Diagnosis not present

## 2018-11-03 DIAGNOSIS — Z418 Encounter for other procedures for purposes other than remedying health state: Secondary | ICD-10-CM | POA: Diagnosis not present

## 2018-11-05 DIAGNOSIS — E1122 Type 2 diabetes mellitus with diabetic chronic kidney disease: Secondary | ICD-10-CM | POA: Diagnosis not present

## 2018-11-05 DIAGNOSIS — N186 End stage renal disease: Secondary | ICD-10-CM | POA: Diagnosis not present

## 2018-11-05 DIAGNOSIS — I10 Essential (primary) hypertension: Secondary | ICD-10-CM | POA: Diagnosis not present

## 2018-11-05 DIAGNOSIS — Z418 Encounter for other procedures for purposes other than remedying health state: Secondary | ICD-10-CM | POA: Diagnosis not present

## 2018-11-05 DIAGNOSIS — D631 Anemia in chronic kidney disease: Secondary | ICD-10-CM | POA: Diagnosis not present

## 2018-11-05 DIAGNOSIS — N2581 Secondary hyperparathyroidism of renal origin: Secondary | ICD-10-CM | POA: Diagnosis not present

## 2018-11-05 DIAGNOSIS — I5022 Chronic systolic (congestive) heart failure: Secondary | ICD-10-CM | POA: Diagnosis not present

## 2018-11-07 DIAGNOSIS — Z418 Encounter for other procedures for purposes other than remedying health state: Secondary | ICD-10-CM | POA: Diagnosis not present

## 2018-11-07 DIAGNOSIS — D631 Anemia in chronic kidney disease: Secondary | ICD-10-CM | POA: Diagnosis not present

## 2018-11-07 DIAGNOSIS — N2581 Secondary hyperparathyroidism of renal origin: Secondary | ICD-10-CM | POA: Diagnosis not present

## 2018-11-07 DIAGNOSIS — N186 End stage renal disease: Secondary | ICD-10-CM | POA: Diagnosis not present

## 2018-11-10 DIAGNOSIS — N2581 Secondary hyperparathyroidism of renal origin: Secondary | ICD-10-CM | POA: Diagnosis not present

## 2018-11-10 DIAGNOSIS — D631 Anemia in chronic kidney disease: Secondary | ICD-10-CM | POA: Diagnosis not present

## 2018-11-10 DIAGNOSIS — Z418 Encounter for other procedures for purposes other than remedying health state: Secondary | ICD-10-CM | POA: Diagnosis not present

## 2018-11-10 DIAGNOSIS — Z23 Encounter for immunization: Secondary | ICD-10-CM | POA: Diagnosis not present

## 2018-11-10 DIAGNOSIS — N186 End stage renal disease: Secondary | ICD-10-CM | POA: Diagnosis not present

## 2018-11-10 DIAGNOSIS — I10 Essential (primary) hypertension: Secondary | ICD-10-CM | POA: Diagnosis not present

## 2018-11-12 DIAGNOSIS — D631 Anemia in chronic kidney disease: Secondary | ICD-10-CM | POA: Diagnosis not present

## 2018-11-12 DIAGNOSIS — Z418 Encounter for other procedures for purposes other than remedying health state: Secondary | ICD-10-CM | POA: Diagnosis not present

## 2018-11-12 DIAGNOSIS — N186 End stage renal disease: Secondary | ICD-10-CM | POA: Diagnosis not present

## 2018-11-12 DIAGNOSIS — Z23 Encounter for immunization: Secondary | ICD-10-CM | POA: Diagnosis not present

## 2018-11-12 DIAGNOSIS — N2581 Secondary hyperparathyroidism of renal origin: Secondary | ICD-10-CM | POA: Diagnosis not present

## 2018-11-14 DIAGNOSIS — D631 Anemia in chronic kidney disease: Secondary | ICD-10-CM | POA: Diagnosis not present

## 2018-11-14 DIAGNOSIS — N2581 Secondary hyperparathyroidism of renal origin: Secondary | ICD-10-CM | POA: Diagnosis not present

## 2018-11-14 DIAGNOSIS — Z418 Encounter for other procedures for purposes other than remedying health state: Secondary | ICD-10-CM | POA: Diagnosis not present

## 2018-11-14 DIAGNOSIS — N186 End stage renal disease: Secondary | ICD-10-CM | POA: Diagnosis not present

## 2018-11-14 DIAGNOSIS — Z23 Encounter for immunization: Secondary | ICD-10-CM | POA: Diagnosis not present

## 2018-11-17 DIAGNOSIS — E1122 Type 2 diabetes mellitus with diabetic chronic kidney disease: Secondary | ICD-10-CM | POA: Diagnosis not present

## 2018-11-17 DIAGNOSIS — I739 Peripheral vascular disease, unspecified: Secondary | ICD-10-CM | POA: Diagnosis not present

## 2018-11-17 DIAGNOSIS — I5022 Chronic systolic (congestive) heart failure: Secondary | ICD-10-CM | POA: Diagnosis not present

## 2018-11-17 DIAGNOSIS — N186 End stage renal disease: Secondary | ICD-10-CM | POA: Diagnosis not present

## 2018-11-17 DIAGNOSIS — Z418 Encounter for other procedures for purposes other than remedying health state: Secondary | ICD-10-CM | POA: Diagnosis not present

## 2018-11-17 DIAGNOSIS — N2581 Secondary hyperparathyroidism of renal origin: Secondary | ICD-10-CM | POA: Diagnosis not present

## 2018-11-17 DIAGNOSIS — Z23 Encounter for immunization: Secondary | ICD-10-CM | POA: Diagnosis not present

## 2018-11-17 DIAGNOSIS — D631 Anemia in chronic kidney disease: Secondary | ICD-10-CM | POA: Diagnosis not present

## 2018-11-19 DIAGNOSIS — Z23 Encounter for immunization: Secondary | ICD-10-CM | POA: Diagnosis not present

## 2018-11-19 DIAGNOSIS — N186 End stage renal disease: Secondary | ICD-10-CM | POA: Diagnosis not present

## 2018-11-19 DIAGNOSIS — Z418 Encounter for other procedures for purposes other than remedying health state: Secondary | ICD-10-CM | POA: Diagnosis not present

## 2018-11-19 DIAGNOSIS — D631 Anemia in chronic kidney disease: Secondary | ICD-10-CM | POA: Diagnosis not present

## 2018-11-19 DIAGNOSIS — N2581 Secondary hyperparathyroidism of renal origin: Secondary | ICD-10-CM | POA: Diagnosis not present

## 2018-11-21 DIAGNOSIS — Z418 Encounter for other procedures for purposes other than remedying health state: Secondary | ICD-10-CM | POA: Diagnosis not present

## 2018-11-21 DIAGNOSIS — N2581 Secondary hyperparathyroidism of renal origin: Secondary | ICD-10-CM | POA: Diagnosis not present

## 2018-11-21 DIAGNOSIS — N186 End stage renal disease: Secondary | ICD-10-CM | POA: Diagnosis not present

## 2018-11-21 DIAGNOSIS — D631 Anemia in chronic kidney disease: Secondary | ICD-10-CM | POA: Diagnosis not present

## 2018-11-21 DIAGNOSIS — Z23 Encounter for immunization: Secondary | ICD-10-CM | POA: Diagnosis not present

## 2018-11-24 DIAGNOSIS — N2581 Secondary hyperparathyroidism of renal origin: Secondary | ICD-10-CM | POA: Diagnosis not present

## 2018-11-24 DIAGNOSIS — D631 Anemia in chronic kidney disease: Secondary | ICD-10-CM | POA: Diagnosis not present

## 2018-11-24 DIAGNOSIS — N186 End stage renal disease: Secondary | ICD-10-CM | POA: Diagnosis not present

## 2018-11-24 DIAGNOSIS — Z23 Encounter for immunization: Secondary | ICD-10-CM | POA: Diagnosis not present

## 2018-11-24 DIAGNOSIS — Z418 Encounter for other procedures for purposes other than remedying health state: Secondary | ICD-10-CM | POA: Diagnosis not present

## 2018-11-26 DIAGNOSIS — Z23 Encounter for immunization: Secondary | ICD-10-CM | POA: Diagnosis not present

## 2018-11-26 DIAGNOSIS — D631 Anemia in chronic kidney disease: Secondary | ICD-10-CM | POA: Diagnosis not present

## 2018-11-26 DIAGNOSIS — Z418 Encounter for other procedures for purposes other than remedying health state: Secondary | ICD-10-CM | POA: Diagnosis not present

## 2018-11-26 DIAGNOSIS — N2581 Secondary hyperparathyroidism of renal origin: Secondary | ICD-10-CM | POA: Diagnosis not present

## 2018-11-26 DIAGNOSIS — N186 End stage renal disease: Secondary | ICD-10-CM | POA: Diagnosis not present

## 2018-11-28 DIAGNOSIS — D631 Anemia in chronic kidney disease: Secondary | ICD-10-CM | POA: Diagnosis not present

## 2018-11-28 DIAGNOSIS — Z418 Encounter for other procedures for purposes other than remedying health state: Secondary | ICD-10-CM | POA: Diagnosis not present

## 2018-11-28 DIAGNOSIS — N186 End stage renal disease: Secondary | ICD-10-CM | POA: Diagnosis not present

## 2018-11-28 DIAGNOSIS — Z23 Encounter for immunization: Secondary | ICD-10-CM | POA: Diagnosis not present

## 2018-11-28 DIAGNOSIS — N2581 Secondary hyperparathyroidism of renal origin: Secondary | ICD-10-CM | POA: Diagnosis not present

## 2018-12-01 DIAGNOSIS — Z23 Encounter for immunization: Secondary | ICD-10-CM | POA: Diagnosis not present

## 2018-12-01 DIAGNOSIS — D631 Anemia in chronic kidney disease: Secondary | ICD-10-CM | POA: Diagnosis not present

## 2018-12-01 DIAGNOSIS — N2581 Secondary hyperparathyroidism of renal origin: Secondary | ICD-10-CM | POA: Diagnosis not present

## 2018-12-01 DIAGNOSIS — N186 End stage renal disease: Secondary | ICD-10-CM | POA: Diagnosis not present

## 2018-12-01 DIAGNOSIS — Z418 Encounter for other procedures for purposes other than remedying health state: Secondary | ICD-10-CM | POA: Diagnosis not present

## 2018-12-03 DIAGNOSIS — D631 Anemia in chronic kidney disease: Secondary | ICD-10-CM | POA: Diagnosis not present

## 2018-12-03 DIAGNOSIS — N2581 Secondary hyperparathyroidism of renal origin: Secondary | ICD-10-CM | POA: Diagnosis not present

## 2018-12-03 DIAGNOSIS — Z23 Encounter for immunization: Secondary | ICD-10-CM | POA: Diagnosis not present

## 2018-12-03 DIAGNOSIS — Z418 Encounter for other procedures for purposes other than remedying health state: Secondary | ICD-10-CM | POA: Diagnosis not present

## 2018-12-03 DIAGNOSIS — N186 End stage renal disease: Secondary | ICD-10-CM | POA: Diagnosis not present

## 2018-12-04 DIAGNOSIS — M150 Primary generalized (osteo)arthritis: Secondary | ICD-10-CM | POA: Diagnosis not present

## 2018-12-04 DIAGNOSIS — I482 Chronic atrial fibrillation, unspecified: Secondary | ICD-10-CM | POA: Diagnosis not present

## 2018-12-04 DIAGNOSIS — E1140 Type 2 diabetes mellitus with diabetic neuropathy, unspecified: Secondary | ICD-10-CM | POA: Diagnosis not present

## 2018-12-04 DIAGNOSIS — E1122 Type 2 diabetes mellitus with diabetic chronic kidney disease: Secondary | ICD-10-CM | POA: Diagnosis not present

## 2018-12-05 DIAGNOSIS — Z23 Encounter for immunization: Secondary | ICD-10-CM | POA: Diagnosis not present

## 2018-12-05 DIAGNOSIS — Z418 Encounter for other procedures for purposes other than remedying health state: Secondary | ICD-10-CM | POA: Diagnosis not present

## 2018-12-05 DIAGNOSIS — N2581 Secondary hyperparathyroidism of renal origin: Secondary | ICD-10-CM | POA: Diagnosis not present

## 2018-12-05 DIAGNOSIS — D631 Anemia in chronic kidney disease: Secondary | ICD-10-CM | POA: Diagnosis not present

## 2018-12-05 DIAGNOSIS — N186 End stage renal disease: Secondary | ICD-10-CM | POA: Diagnosis not present

## 2018-12-06 DIAGNOSIS — R569 Unspecified convulsions: Secondary | ICD-10-CM | POA: Diagnosis not present

## 2018-12-06 DIAGNOSIS — I10 Essential (primary) hypertension: Secondary | ICD-10-CM | POA: Diagnosis not present

## 2018-12-08 DIAGNOSIS — Z418 Encounter for other procedures for purposes other than remedying health state: Secondary | ICD-10-CM | POA: Diagnosis not present

## 2018-12-08 DIAGNOSIS — D631 Anemia in chronic kidney disease: Secondary | ICD-10-CM | POA: Diagnosis not present

## 2018-12-08 DIAGNOSIS — N2581 Secondary hyperparathyroidism of renal origin: Secondary | ICD-10-CM | POA: Diagnosis not present

## 2018-12-08 DIAGNOSIS — N186 End stage renal disease: Secondary | ICD-10-CM | POA: Diagnosis not present

## 2018-12-08 DIAGNOSIS — Z23 Encounter for immunization: Secondary | ICD-10-CM | POA: Diagnosis not present

## 2018-12-09 DIAGNOSIS — Z79899 Other long term (current) drug therapy: Secondary | ICD-10-CM | POA: Diagnosis not present

## 2018-12-10 DIAGNOSIS — N186 End stage renal disease: Secondary | ICD-10-CM | POA: Diagnosis not present

## 2018-12-10 DIAGNOSIS — Z418 Encounter for other procedures for purposes other than remedying health state: Secondary | ICD-10-CM | POA: Diagnosis not present

## 2018-12-10 DIAGNOSIS — I10 Essential (primary) hypertension: Secondary | ICD-10-CM | POA: Diagnosis not present

## 2018-12-10 DIAGNOSIS — D631 Anemia in chronic kidney disease: Secondary | ICD-10-CM | POA: Diagnosis not present

## 2018-12-10 DIAGNOSIS — N2581 Secondary hyperparathyroidism of renal origin: Secondary | ICD-10-CM | POA: Diagnosis not present

## 2018-12-10 DIAGNOSIS — Z23 Encounter for immunization: Secondary | ICD-10-CM | POA: Diagnosis not present

## 2018-12-11 DIAGNOSIS — I69354 Hemiplegia and hemiparesis following cerebral infarction affecting left non-dominant side: Secondary | ICD-10-CM | POA: Diagnosis not present

## 2018-12-11 DIAGNOSIS — G40909 Epilepsy, unspecified, not intractable, without status epilepticus: Secondary | ICD-10-CM | POA: Diagnosis not present

## 2018-12-11 DIAGNOSIS — N186 End stage renal disease: Secondary | ICD-10-CM | POA: Diagnosis not present

## 2018-12-11 DIAGNOSIS — I132 Hypertensive heart and chronic kidney disease with heart failure and with stage 5 chronic kidney disease, or end stage renal disease: Secondary | ICD-10-CM | POA: Diagnosis not present

## 2018-12-12 DIAGNOSIS — I10 Essential (primary) hypertension: Secondary | ICD-10-CM | POA: Diagnosis not present

## 2018-12-12 DIAGNOSIS — D631 Anemia in chronic kidney disease: Secondary | ICD-10-CM | POA: Diagnosis not present

## 2018-12-12 DIAGNOSIS — Z418 Encounter for other procedures for purposes other than remedying health state: Secondary | ICD-10-CM | POA: Diagnosis not present

## 2018-12-12 DIAGNOSIS — N186 End stage renal disease: Secondary | ICD-10-CM | POA: Diagnosis not present

## 2018-12-12 DIAGNOSIS — N2581 Secondary hyperparathyroidism of renal origin: Secondary | ICD-10-CM | POA: Diagnosis not present

## 2018-12-12 DIAGNOSIS — Z23 Encounter for immunization: Secondary | ICD-10-CM | POA: Diagnosis not present

## 2018-12-15 DIAGNOSIS — I10 Essential (primary) hypertension: Secondary | ICD-10-CM | POA: Diagnosis not present

## 2018-12-15 DIAGNOSIS — Z23 Encounter for immunization: Secondary | ICD-10-CM | POA: Diagnosis not present

## 2018-12-15 DIAGNOSIS — E119 Type 2 diabetes mellitus without complications: Secondary | ICD-10-CM | POA: Diagnosis not present

## 2018-12-15 DIAGNOSIS — D631 Anemia in chronic kidney disease: Secondary | ICD-10-CM | POA: Diagnosis not present

## 2018-12-15 DIAGNOSIS — N186 End stage renal disease: Secondary | ICD-10-CM | POA: Diagnosis not present

## 2018-12-15 DIAGNOSIS — N2581 Secondary hyperparathyroidism of renal origin: Secondary | ICD-10-CM | POA: Diagnosis not present

## 2018-12-15 DIAGNOSIS — I739 Peripheral vascular disease, unspecified: Secondary | ICD-10-CM | POA: Diagnosis not present

## 2018-12-15 DIAGNOSIS — I69351 Hemiplegia and hemiparesis following cerebral infarction affecting right dominant side: Secondary | ICD-10-CM | POA: Diagnosis not present

## 2018-12-15 DIAGNOSIS — Z418 Encounter for other procedures for purposes other than remedying health state: Secondary | ICD-10-CM | POA: Diagnosis not present

## 2018-12-17 DIAGNOSIS — N2581 Secondary hyperparathyroidism of renal origin: Secondary | ICD-10-CM | POA: Diagnosis not present

## 2018-12-17 DIAGNOSIS — N186 End stage renal disease: Secondary | ICD-10-CM | POA: Diagnosis not present

## 2018-12-17 DIAGNOSIS — Z23 Encounter for immunization: Secondary | ICD-10-CM | POA: Diagnosis not present

## 2018-12-17 DIAGNOSIS — Z418 Encounter for other procedures for purposes other than remedying health state: Secondary | ICD-10-CM | POA: Diagnosis not present

## 2018-12-17 DIAGNOSIS — I10 Essential (primary) hypertension: Secondary | ICD-10-CM | POA: Diagnosis not present

## 2018-12-17 DIAGNOSIS — D631 Anemia in chronic kidney disease: Secondary | ICD-10-CM | POA: Diagnosis not present

## 2018-12-19 DIAGNOSIS — Z23 Encounter for immunization: Secondary | ICD-10-CM | POA: Diagnosis not present

## 2018-12-19 DIAGNOSIS — D631 Anemia in chronic kidney disease: Secondary | ICD-10-CM | POA: Diagnosis not present

## 2018-12-19 DIAGNOSIS — N2581 Secondary hyperparathyroidism of renal origin: Secondary | ICD-10-CM | POA: Diagnosis not present

## 2018-12-19 DIAGNOSIS — Z418 Encounter for other procedures for purposes other than remedying health state: Secondary | ICD-10-CM | POA: Diagnosis not present

## 2018-12-19 DIAGNOSIS — I10 Essential (primary) hypertension: Secondary | ICD-10-CM | POA: Diagnosis not present

## 2018-12-19 DIAGNOSIS — N186 End stage renal disease: Secondary | ICD-10-CM | POA: Diagnosis not present

## 2018-12-22 DIAGNOSIS — D631 Anemia in chronic kidney disease: Secondary | ICD-10-CM | POA: Diagnosis not present

## 2018-12-22 DIAGNOSIS — N186 End stage renal disease: Secondary | ICD-10-CM | POA: Diagnosis not present

## 2018-12-22 DIAGNOSIS — Z23 Encounter for immunization: Secondary | ICD-10-CM | POA: Diagnosis not present

## 2018-12-22 DIAGNOSIS — Z418 Encounter for other procedures for purposes other than remedying health state: Secondary | ICD-10-CM | POA: Diagnosis not present

## 2018-12-22 DIAGNOSIS — N2581 Secondary hyperparathyroidism of renal origin: Secondary | ICD-10-CM | POA: Diagnosis not present

## 2018-12-22 DIAGNOSIS — I10 Essential (primary) hypertension: Secondary | ICD-10-CM | POA: Diagnosis not present

## 2018-12-24 DIAGNOSIS — Z23 Encounter for immunization: Secondary | ICD-10-CM | POA: Diagnosis not present

## 2018-12-24 DIAGNOSIS — N186 End stage renal disease: Secondary | ICD-10-CM | POA: Diagnosis not present

## 2018-12-24 DIAGNOSIS — G40909 Epilepsy, unspecified, not intractable, without status epilepticus: Secondary | ICD-10-CM | POA: Diagnosis not present

## 2018-12-24 DIAGNOSIS — Z418 Encounter for other procedures for purposes other than remedying health state: Secondary | ICD-10-CM | POA: Diagnosis not present

## 2018-12-24 DIAGNOSIS — R899 Unspecified abnormal finding in specimens from other organs, systems and tissues: Secondary | ICD-10-CM | POA: Diagnosis not present

## 2018-12-24 DIAGNOSIS — I10 Essential (primary) hypertension: Secondary | ICD-10-CM | POA: Diagnosis not present

## 2018-12-24 DIAGNOSIS — D631 Anemia in chronic kidney disease: Secondary | ICD-10-CM | POA: Diagnosis not present

## 2018-12-24 DIAGNOSIS — N2581 Secondary hyperparathyroidism of renal origin: Secondary | ICD-10-CM | POA: Diagnosis not present

## 2018-12-26 DIAGNOSIS — I10 Essential (primary) hypertension: Secondary | ICD-10-CM | POA: Diagnosis not present

## 2018-12-26 DIAGNOSIS — Z418 Encounter for other procedures for purposes other than remedying health state: Secondary | ICD-10-CM | POA: Diagnosis not present

## 2018-12-26 DIAGNOSIS — D631 Anemia in chronic kidney disease: Secondary | ICD-10-CM | POA: Diagnosis not present

## 2018-12-26 DIAGNOSIS — N186 End stage renal disease: Secondary | ICD-10-CM | POA: Diagnosis not present

## 2018-12-26 DIAGNOSIS — N2581 Secondary hyperparathyroidism of renal origin: Secondary | ICD-10-CM | POA: Diagnosis not present

## 2018-12-26 DIAGNOSIS — Z23 Encounter for immunization: Secondary | ICD-10-CM | POA: Diagnosis not present

## 2018-12-27 DIAGNOSIS — Z79899 Other long term (current) drug therapy: Secondary | ICD-10-CM | POA: Diagnosis not present

## 2018-12-29 DIAGNOSIS — D631 Anemia in chronic kidney disease: Secondary | ICD-10-CM | POA: Diagnosis not present

## 2018-12-29 DIAGNOSIS — Z418 Encounter for other procedures for purposes other than remedying health state: Secondary | ICD-10-CM | POA: Diagnosis not present

## 2018-12-29 DIAGNOSIS — I10 Essential (primary) hypertension: Secondary | ICD-10-CM | POA: Diagnosis not present

## 2018-12-29 DIAGNOSIS — N186 End stage renal disease: Secondary | ICD-10-CM | POA: Diagnosis not present

## 2018-12-29 DIAGNOSIS — Z23 Encounter for immunization: Secondary | ICD-10-CM | POA: Diagnosis not present

## 2018-12-29 DIAGNOSIS — N2581 Secondary hyperparathyroidism of renal origin: Secondary | ICD-10-CM | POA: Diagnosis not present

## 2018-12-31 DIAGNOSIS — N2581 Secondary hyperparathyroidism of renal origin: Secondary | ICD-10-CM | POA: Diagnosis not present

## 2018-12-31 DIAGNOSIS — Z23 Encounter for immunization: Secondary | ICD-10-CM | POA: Diagnosis not present

## 2018-12-31 DIAGNOSIS — D631 Anemia in chronic kidney disease: Secondary | ICD-10-CM | POA: Diagnosis not present

## 2018-12-31 DIAGNOSIS — N186 End stage renal disease: Secondary | ICD-10-CM | POA: Diagnosis not present

## 2018-12-31 DIAGNOSIS — Z418 Encounter for other procedures for purposes other than remedying health state: Secondary | ICD-10-CM | POA: Diagnosis not present

## 2018-12-31 DIAGNOSIS — I10 Essential (primary) hypertension: Secondary | ICD-10-CM | POA: Diagnosis not present

## 2019-01-02 DIAGNOSIS — M150 Primary generalized (osteo)arthritis: Secondary | ICD-10-CM | POA: Diagnosis not present

## 2019-01-02 DIAGNOSIS — E1122 Type 2 diabetes mellitus with diabetic chronic kidney disease: Secondary | ICD-10-CM | POA: Diagnosis not present

## 2019-01-02 DIAGNOSIS — N2581 Secondary hyperparathyroidism of renal origin: Secondary | ICD-10-CM | POA: Diagnosis not present

## 2019-01-02 DIAGNOSIS — Z418 Encounter for other procedures for purposes other than remedying health state: Secondary | ICD-10-CM | POA: Diagnosis not present

## 2019-01-02 DIAGNOSIS — D631 Anemia in chronic kidney disease: Secondary | ICD-10-CM | POA: Diagnosis not present

## 2019-01-02 DIAGNOSIS — I10 Essential (primary) hypertension: Secondary | ICD-10-CM | POA: Diagnosis not present

## 2019-01-02 DIAGNOSIS — I5022 Chronic systolic (congestive) heart failure: Secondary | ICD-10-CM | POA: Diagnosis not present

## 2019-01-02 DIAGNOSIS — Z23 Encounter for immunization: Secondary | ICD-10-CM | POA: Diagnosis not present

## 2019-01-02 DIAGNOSIS — N186 End stage renal disease: Secondary | ICD-10-CM | POA: Diagnosis not present

## 2019-01-05 DIAGNOSIS — Z23 Encounter for immunization: Secondary | ICD-10-CM | POA: Diagnosis not present

## 2019-01-05 DIAGNOSIS — I10 Essential (primary) hypertension: Secondary | ICD-10-CM | POA: Diagnosis not present

## 2019-01-05 DIAGNOSIS — N186 End stage renal disease: Secondary | ICD-10-CM | POA: Diagnosis not present

## 2019-01-05 DIAGNOSIS — D631 Anemia in chronic kidney disease: Secondary | ICD-10-CM | POA: Diagnosis not present

## 2019-01-05 DIAGNOSIS — N2581 Secondary hyperparathyroidism of renal origin: Secondary | ICD-10-CM | POA: Diagnosis not present

## 2019-01-05 DIAGNOSIS — Z418 Encounter for other procedures for purposes other than remedying health state: Secondary | ICD-10-CM | POA: Diagnosis not present

## 2019-01-07 DIAGNOSIS — Z89612 Acquired absence of left leg above knee: Secondary | ICD-10-CM | POA: Diagnosis not present

## 2019-01-07 DIAGNOSIS — M24542 Contracture, left hand: Secondary | ICD-10-CM | POA: Diagnosis not present

## 2019-01-07 DIAGNOSIS — I482 Chronic atrial fibrillation, unspecified: Secondary | ICD-10-CM | POA: Diagnosis not present

## 2019-01-07 DIAGNOSIS — Z23 Encounter for immunization: Secondary | ICD-10-CM | POA: Diagnosis not present

## 2019-01-07 DIAGNOSIS — H548 Legal blindness, as defined in USA: Secondary | ICD-10-CM | POA: Diagnosis not present

## 2019-01-07 DIAGNOSIS — N186 End stage renal disease: Secondary | ICD-10-CM | POA: Diagnosis not present

## 2019-01-07 DIAGNOSIS — I10 Essential (primary) hypertension: Secondary | ICD-10-CM | POA: Diagnosis not present

## 2019-01-07 DIAGNOSIS — R569 Unspecified convulsions: Secondary | ICD-10-CM | POA: Diagnosis not present

## 2019-01-07 DIAGNOSIS — E559 Vitamin D deficiency, unspecified: Secondary | ICD-10-CM | POA: Diagnosis not present

## 2019-01-07 DIAGNOSIS — I509 Heart failure, unspecified: Secondary | ICD-10-CM | POA: Diagnosis not present

## 2019-01-07 DIAGNOSIS — I429 Cardiomyopathy, unspecified: Secondary | ICD-10-CM | POA: Diagnosis not present

## 2019-01-07 DIAGNOSIS — E1122 Type 2 diabetes mellitus with diabetic chronic kidney disease: Secondary | ICD-10-CM | POA: Diagnosis not present

## 2019-01-07 DIAGNOSIS — E785 Hyperlipidemia, unspecified: Secondary | ICD-10-CM | POA: Diagnosis not present

## 2019-01-07 DIAGNOSIS — E875 Hyperkalemia: Secondary | ICD-10-CM | POA: Diagnosis not present

## 2019-01-07 DIAGNOSIS — E1169 Type 2 diabetes mellitus with other specified complication: Secondary | ICD-10-CM | POA: Diagnosis not present

## 2019-01-07 DIAGNOSIS — K5900 Constipation, unspecified: Secondary | ICD-10-CM | POA: Diagnosis not present

## 2019-01-07 DIAGNOSIS — R1312 Dysphagia, oropharyngeal phase: Secondary | ICD-10-CM | POA: Diagnosis not present

## 2019-01-07 DIAGNOSIS — I69954 Hemiplegia and hemiparesis following unspecified cerebrovascular disease affecting left non-dominant side: Secondary | ICD-10-CM | POA: Diagnosis not present

## 2019-01-07 DIAGNOSIS — Z89511 Acquired absence of right leg below knee: Secondary | ICD-10-CM | POA: Diagnosis not present

## 2019-01-07 DIAGNOSIS — I639 Cerebral infarction, unspecified: Secondary | ICD-10-CM | POA: Diagnosis not present

## 2019-01-07 DIAGNOSIS — M24522 Contracture, left elbow: Secondary | ICD-10-CM | POA: Diagnosis not present

## 2019-01-07 DIAGNOSIS — Z992 Dependence on renal dialysis: Secondary | ICD-10-CM | POA: Diagnosis not present

## 2019-01-07 DIAGNOSIS — N2581 Secondary hyperparathyroidism of renal origin: Secondary | ICD-10-CM | POA: Diagnosis not present

## 2019-01-07 DIAGNOSIS — D631 Anemia in chronic kidney disease: Secondary | ICD-10-CM | POA: Diagnosis not present

## 2019-01-07 DIAGNOSIS — I739 Peripheral vascular disease, unspecified: Secondary | ICD-10-CM | POA: Diagnosis not present

## 2019-01-07 DIAGNOSIS — Z418 Encounter for other procedures for purposes other than remedying health state: Secondary | ICD-10-CM | POA: Diagnosis not present

## 2019-01-07 DIAGNOSIS — F339 Major depressive disorder, recurrent, unspecified: Secondary | ICD-10-CM | POA: Diagnosis not present

## 2019-01-08 DIAGNOSIS — I639 Cerebral infarction, unspecified: Secondary | ICD-10-CM | POA: Diagnosis not present

## 2019-01-08 DIAGNOSIS — R1312 Dysphagia, oropharyngeal phase: Secondary | ICD-10-CM | POA: Diagnosis not present

## 2019-01-08 DIAGNOSIS — M24522 Contracture, left elbow: Secondary | ICD-10-CM | POA: Diagnosis not present

## 2019-01-08 DIAGNOSIS — I69954 Hemiplegia and hemiparesis following unspecified cerebrovascular disease affecting left non-dominant side: Secondary | ICD-10-CM | POA: Diagnosis not present

## 2019-01-08 DIAGNOSIS — M109 Gout, unspecified: Secondary | ICD-10-CM | POA: Diagnosis not present

## 2019-01-08 DIAGNOSIS — G40909 Epilepsy, unspecified, not intractable, without status epilepticus: Secondary | ICD-10-CM | POA: Diagnosis not present

## 2019-01-08 DIAGNOSIS — N186 End stage renal disease: Secondary | ICD-10-CM | POA: Diagnosis not present

## 2019-01-08 DIAGNOSIS — E1122 Type 2 diabetes mellitus with diabetic chronic kidney disease: Secondary | ICD-10-CM | POA: Diagnosis not present

## 2019-01-08 DIAGNOSIS — M24542 Contracture, left hand: Secondary | ICD-10-CM | POA: Diagnosis not present

## 2019-01-08 DIAGNOSIS — F339 Major depressive disorder, recurrent, unspecified: Secondary | ICD-10-CM | POA: Diagnosis not present

## 2019-01-09 DIAGNOSIS — M24522 Contracture, left elbow: Secondary | ICD-10-CM | POA: Diagnosis not present

## 2019-01-09 DIAGNOSIS — F339 Major depressive disorder, recurrent, unspecified: Secondary | ICD-10-CM | POA: Diagnosis not present

## 2019-01-09 DIAGNOSIS — I639 Cerebral infarction, unspecified: Secondary | ICD-10-CM | POA: Diagnosis not present

## 2019-01-09 DIAGNOSIS — I10 Essential (primary) hypertension: Secondary | ICD-10-CM | POA: Diagnosis not present

## 2019-01-09 DIAGNOSIS — R1312 Dysphagia, oropharyngeal phase: Secondary | ICD-10-CM | POA: Diagnosis not present

## 2019-01-09 DIAGNOSIS — M24542 Contracture, left hand: Secondary | ICD-10-CM | POA: Diagnosis not present

## 2019-01-09 DIAGNOSIS — I69954 Hemiplegia and hemiparesis following unspecified cerebrovascular disease affecting left non-dominant side: Secondary | ICD-10-CM | POA: Diagnosis not present

## 2019-01-09 DIAGNOSIS — N186 End stage renal disease: Secondary | ICD-10-CM | POA: Diagnosis not present

## 2019-01-09 DIAGNOSIS — Z418 Encounter for other procedures for purposes other than remedying health state: Secondary | ICD-10-CM | POA: Diagnosis not present

## 2019-01-09 DIAGNOSIS — D631 Anemia in chronic kidney disease: Secondary | ICD-10-CM | POA: Diagnosis not present

## 2019-01-09 DIAGNOSIS — Z23 Encounter for immunization: Secondary | ICD-10-CM | POA: Diagnosis not present

## 2019-01-09 DIAGNOSIS — N2581 Secondary hyperparathyroidism of renal origin: Secondary | ICD-10-CM | POA: Diagnosis not present

## 2019-01-10 DIAGNOSIS — D631 Anemia in chronic kidney disease: Secondary | ICD-10-CM | POA: Diagnosis not present

## 2019-01-10 DIAGNOSIS — N186 End stage renal disease: Secondary | ICD-10-CM | POA: Diagnosis not present

## 2019-01-10 DIAGNOSIS — I10 Essential (primary) hypertension: Secondary | ICD-10-CM | POA: Diagnosis not present

## 2019-01-11 DIAGNOSIS — I69351 Hemiplegia and hemiparesis following cerebral infarction affecting right dominant side: Secondary | ICD-10-CM | POA: Diagnosis not present

## 2019-01-11 DIAGNOSIS — I739 Peripheral vascular disease, unspecified: Secondary | ICD-10-CM | POA: Diagnosis not present

## 2019-01-11 DIAGNOSIS — E119 Type 2 diabetes mellitus without complications: Secondary | ICD-10-CM | POA: Diagnosis not present

## 2019-01-11 DIAGNOSIS — N186 End stage renal disease: Secondary | ICD-10-CM | POA: Diagnosis not present

## 2019-01-12 DIAGNOSIS — R569 Unspecified convulsions: Secondary | ICD-10-CM | POA: Diagnosis not present

## 2019-01-12 DIAGNOSIS — I429 Cardiomyopathy, unspecified: Secondary | ICD-10-CM | POA: Diagnosis not present

## 2019-01-12 DIAGNOSIS — N186 End stage renal disease: Secondary | ICD-10-CM | POA: Diagnosis not present

## 2019-01-12 DIAGNOSIS — Z89511 Acquired absence of right leg below knee: Secondary | ICD-10-CM | POA: Diagnosis not present

## 2019-01-12 DIAGNOSIS — E785 Hyperlipidemia, unspecified: Secondary | ICD-10-CM | POA: Diagnosis not present

## 2019-01-12 DIAGNOSIS — E1122 Type 2 diabetes mellitus with diabetic chronic kidney disease: Secondary | ICD-10-CM | POA: Diagnosis not present

## 2019-01-12 DIAGNOSIS — D631 Anemia in chronic kidney disease: Secondary | ICD-10-CM | POA: Diagnosis not present

## 2019-01-12 DIAGNOSIS — I69954 Hemiplegia and hemiparesis following unspecified cerebrovascular disease affecting left non-dominant side: Secondary | ICD-10-CM | POA: Diagnosis not present

## 2019-01-12 DIAGNOSIS — E875 Hyperkalemia: Secondary | ICD-10-CM | POA: Diagnosis not present

## 2019-01-12 DIAGNOSIS — Z89612 Acquired absence of left leg above knee: Secondary | ICD-10-CM | POA: Diagnosis not present

## 2019-01-12 DIAGNOSIS — R1312 Dysphagia, oropharyngeal phase: Secondary | ICD-10-CM | POA: Diagnosis not present

## 2019-01-12 DIAGNOSIS — F339 Major depressive disorder, recurrent, unspecified: Secondary | ICD-10-CM | POA: Diagnosis not present

## 2019-01-12 DIAGNOSIS — E559 Vitamin D deficiency, unspecified: Secondary | ICD-10-CM | POA: Diagnosis not present

## 2019-01-12 DIAGNOSIS — M24522 Contracture, left elbow: Secondary | ICD-10-CM | POA: Diagnosis not present

## 2019-01-12 DIAGNOSIS — Z992 Dependence on renal dialysis: Secondary | ICD-10-CM | POA: Diagnosis not present

## 2019-01-12 DIAGNOSIS — M24542 Contracture, left hand: Secondary | ICD-10-CM | POA: Diagnosis not present

## 2019-01-12 DIAGNOSIS — E1169 Type 2 diabetes mellitus with other specified complication: Secondary | ICD-10-CM | POA: Diagnosis not present

## 2019-01-12 DIAGNOSIS — Z418 Encounter for other procedures for purposes other than remedying health state: Secondary | ICD-10-CM | POA: Diagnosis not present

## 2019-01-12 DIAGNOSIS — I10 Essential (primary) hypertension: Secondary | ICD-10-CM | POA: Diagnosis not present

## 2019-01-12 DIAGNOSIS — I509 Heart failure, unspecified: Secondary | ICD-10-CM | POA: Diagnosis not present

## 2019-01-12 DIAGNOSIS — I739 Peripheral vascular disease, unspecified: Secondary | ICD-10-CM | POA: Diagnosis not present

## 2019-01-12 DIAGNOSIS — H548 Legal blindness, as defined in USA: Secondary | ICD-10-CM | POA: Diagnosis not present

## 2019-01-12 DIAGNOSIS — K5900 Constipation, unspecified: Secondary | ICD-10-CM | POA: Diagnosis not present

## 2019-01-12 DIAGNOSIS — I482 Chronic atrial fibrillation, unspecified: Secondary | ICD-10-CM | POA: Diagnosis not present

## 2019-01-12 DIAGNOSIS — N2581 Secondary hyperparathyroidism of renal origin: Secondary | ICD-10-CM | POA: Diagnosis not present

## 2019-01-12 DIAGNOSIS — I639 Cerebral infarction, unspecified: Secondary | ICD-10-CM | POA: Diagnosis not present

## 2019-01-13 DIAGNOSIS — F339 Major depressive disorder, recurrent, unspecified: Secondary | ICD-10-CM | POA: Diagnosis not present

## 2019-01-13 DIAGNOSIS — M24542 Contracture, left hand: Secondary | ICD-10-CM | POA: Diagnosis not present

## 2019-01-13 DIAGNOSIS — I69954 Hemiplegia and hemiparesis following unspecified cerebrovascular disease affecting left non-dominant side: Secondary | ICD-10-CM | POA: Diagnosis not present

## 2019-01-13 DIAGNOSIS — I639 Cerebral infarction, unspecified: Secondary | ICD-10-CM | POA: Diagnosis not present

## 2019-01-13 DIAGNOSIS — R1312 Dysphagia, oropharyngeal phase: Secondary | ICD-10-CM | POA: Diagnosis not present

## 2019-01-13 DIAGNOSIS — M24522 Contracture, left elbow: Secondary | ICD-10-CM | POA: Diagnosis not present

## 2019-01-14 DIAGNOSIS — M24522 Contracture, left elbow: Secondary | ICD-10-CM | POA: Diagnosis not present

## 2019-01-14 DIAGNOSIS — N2581 Secondary hyperparathyroidism of renal origin: Secondary | ICD-10-CM | POA: Diagnosis not present

## 2019-01-14 DIAGNOSIS — M24542 Contracture, left hand: Secondary | ICD-10-CM | POA: Diagnosis not present

## 2019-01-14 DIAGNOSIS — F339 Major depressive disorder, recurrent, unspecified: Secondary | ICD-10-CM | POA: Diagnosis not present

## 2019-01-14 DIAGNOSIS — E1122 Type 2 diabetes mellitus with diabetic chronic kidney disease: Secondary | ICD-10-CM | POA: Diagnosis not present

## 2019-01-14 DIAGNOSIS — Z418 Encounter for other procedures for purposes other than remedying health state: Secondary | ICD-10-CM | POA: Diagnosis not present

## 2019-01-14 DIAGNOSIS — E039 Hypothyroidism, unspecified: Secondary | ICD-10-CM | POA: Diagnosis not present

## 2019-01-14 DIAGNOSIS — D631 Anemia in chronic kidney disease: Secondary | ICD-10-CM | POA: Diagnosis not present

## 2019-01-14 DIAGNOSIS — I69954 Hemiplegia and hemiparesis following unspecified cerebrovascular disease affecting left non-dominant side: Secondary | ICD-10-CM | POA: Diagnosis not present

## 2019-01-14 DIAGNOSIS — Z79899 Other long term (current) drug therapy: Secondary | ICD-10-CM | POA: Diagnosis not present

## 2019-01-14 DIAGNOSIS — I639 Cerebral infarction, unspecified: Secondary | ICD-10-CM | POA: Diagnosis not present

## 2019-01-14 DIAGNOSIS — N186 End stage renal disease: Secondary | ICD-10-CM | POA: Diagnosis not present

## 2019-01-14 DIAGNOSIS — R1312 Dysphagia, oropharyngeal phase: Secondary | ICD-10-CM | POA: Diagnosis not present

## 2019-01-15 DIAGNOSIS — R1312 Dysphagia, oropharyngeal phase: Secondary | ICD-10-CM | POA: Diagnosis not present

## 2019-01-15 DIAGNOSIS — I639 Cerebral infarction, unspecified: Secondary | ICD-10-CM | POA: Diagnosis not present

## 2019-01-15 DIAGNOSIS — M24542 Contracture, left hand: Secondary | ICD-10-CM | POA: Diagnosis not present

## 2019-01-15 DIAGNOSIS — I69954 Hemiplegia and hemiparesis following unspecified cerebrovascular disease affecting left non-dominant side: Secondary | ICD-10-CM | POA: Diagnosis not present

## 2019-01-15 DIAGNOSIS — F339 Major depressive disorder, recurrent, unspecified: Secondary | ICD-10-CM | POA: Diagnosis not present

## 2019-01-15 DIAGNOSIS — M24522 Contracture, left elbow: Secondary | ICD-10-CM | POA: Diagnosis not present

## 2019-01-16 DIAGNOSIS — F339 Major depressive disorder, recurrent, unspecified: Secondary | ICD-10-CM | POA: Diagnosis not present

## 2019-01-16 DIAGNOSIS — N186 End stage renal disease: Secondary | ICD-10-CM | POA: Diagnosis not present

## 2019-01-16 DIAGNOSIS — I69954 Hemiplegia and hemiparesis following unspecified cerebrovascular disease affecting left non-dominant side: Secondary | ICD-10-CM | POA: Diagnosis not present

## 2019-01-16 DIAGNOSIS — D631 Anemia in chronic kidney disease: Secondary | ICD-10-CM | POA: Diagnosis not present

## 2019-01-16 DIAGNOSIS — R1312 Dysphagia, oropharyngeal phase: Secondary | ICD-10-CM | POA: Diagnosis not present

## 2019-01-16 DIAGNOSIS — M24542 Contracture, left hand: Secondary | ICD-10-CM | POA: Diagnosis not present

## 2019-01-16 DIAGNOSIS — M24522 Contracture, left elbow: Secondary | ICD-10-CM | POA: Diagnosis not present

## 2019-01-16 DIAGNOSIS — Z418 Encounter for other procedures for purposes other than remedying health state: Secondary | ICD-10-CM | POA: Diagnosis not present

## 2019-01-16 DIAGNOSIS — I639 Cerebral infarction, unspecified: Secondary | ICD-10-CM | POA: Diagnosis not present

## 2019-01-16 DIAGNOSIS — N2581 Secondary hyperparathyroidism of renal origin: Secondary | ICD-10-CM | POA: Diagnosis not present

## 2019-01-19 DIAGNOSIS — I69954 Hemiplegia and hemiparesis following unspecified cerebrovascular disease affecting left non-dominant side: Secondary | ICD-10-CM | POA: Diagnosis not present

## 2019-01-19 DIAGNOSIS — D631 Anemia in chronic kidney disease: Secondary | ICD-10-CM | POA: Diagnosis not present

## 2019-01-19 DIAGNOSIS — I639 Cerebral infarction, unspecified: Secondary | ICD-10-CM | POA: Diagnosis not present

## 2019-01-19 DIAGNOSIS — M24522 Contracture, left elbow: Secondary | ICD-10-CM | POA: Diagnosis not present

## 2019-01-19 DIAGNOSIS — N186 End stage renal disease: Secondary | ICD-10-CM | POA: Diagnosis not present

## 2019-01-19 DIAGNOSIS — M24542 Contracture, left hand: Secondary | ICD-10-CM | POA: Diagnosis not present

## 2019-01-19 DIAGNOSIS — R1312 Dysphagia, oropharyngeal phase: Secondary | ICD-10-CM | POA: Diagnosis not present

## 2019-01-19 DIAGNOSIS — Z418 Encounter for other procedures for purposes other than remedying health state: Secondary | ICD-10-CM | POA: Diagnosis not present

## 2019-01-19 DIAGNOSIS — N2581 Secondary hyperparathyroidism of renal origin: Secondary | ICD-10-CM | POA: Diagnosis not present

## 2019-01-19 DIAGNOSIS — F339 Major depressive disorder, recurrent, unspecified: Secondary | ICD-10-CM | POA: Diagnosis not present

## 2019-01-20 DIAGNOSIS — I69954 Hemiplegia and hemiparesis following unspecified cerebrovascular disease affecting left non-dominant side: Secondary | ICD-10-CM | POA: Diagnosis not present

## 2019-01-20 DIAGNOSIS — R1312 Dysphagia, oropharyngeal phase: Secondary | ICD-10-CM | POA: Diagnosis not present

## 2019-01-20 DIAGNOSIS — M24522 Contracture, left elbow: Secondary | ICD-10-CM | POA: Diagnosis not present

## 2019-01-20 DIAGNOSIS — F339 Major depressive disorder, recurrent, unspecified: Secondary | ICD-10-CM | POA: Diagnosis not present

## 2019-01-20 DIAGNOSIS — I639 Cerebral infarction, unspecified: Secondary | ICD-10-CM | POA: Diagnosis not present

## 2019-01-20 DIAGNOSIS — M24542 Contracture, left hand: Secondary | ICD-10-CM | POA: Diagnosis not present

## 2019-01-21 DIAGNOSIS — M24522 Contracture, left elbow: Secondary | ICD-10-CM | POA: Diagnosis not present

## 2019-01-21 DIAGNOSIS — N186 End stage renal disease: Secondary | ICD-10-CM | POA: Diagnosis not present

## 2019-01-21 DIAGNOSIS — Z418 Encounter for other procedures for purposes other than remedying health state: Secondary | ICD-10-CM | POA: Diagnosis not present

## 2019-01-21 DIAGNOSIS — I69954 Hemiplegia and hemiparesis following unspecified cerebrovascular disease affecting left non-dominant side: Secondary | ICD-10-CM | POA: Diagnosis not present

## 2019-01-21 DIAGNOSIS — D631 Anemia in chronic kidney disease: Secondary | ICD-10-CM | POA: Diagnosis not present

## 2019-01-21 DIAGNOSIS — I639 Cerebral infarction, unspecified: Secondary | ICD-10-CM | POA: Diagnosis not present

## 2019-01-21 DIAGNOSIS — F339 Major depressive disorder, recurrent, unspecified: Secondary | ICD-10-CM | POA: Diagnosis not present

## 2019-01-21 DIAGNOSIS — M24542 Contracture, left hand: Secondary | ICD-10-CM | POA: Diagnosis not present

## 2019-01-21 DIAGNOSIS — N2581 Secondary hyperparathyroidism of renal origin: Secondary | ICD-10-CM | POA: Diagnosis not present

## 2019-01-21 DIAGNOSIS — Z20828 Contact with and (suspected) exposure to other viral communicable diseases: Secondary | ICD-10-CM | POA: Diagnosis not present

## 2019-01-21 DIAGNOSIS — R1312 Dysphagia, oropharyngeal phase: Secondary | ICD-10-CM | POA: Diagnosis not present

## 2019-01-22 DIAGNOSIS — M24522 Contracture, left elbow: Secondary | ICD-10-CM | POA: Diagnosis not present

## 2019-01-22 DIAGNOSIS — I639 Cerebral infarction, unspecified: Secondary | ICD-10-CM | POA: Diagnosis not present

## 2019-01-22 DIAGNOSIS — I69954 Hemiplegia and hemiparesis following unspecified cerebrovascular disease affecting left non-dominant side: Secondary | ICD-10-CM | POA: Diagnosis not present

## 2019-01-22 DIAGNOSIS — M24542 Contracture, left hand: Secondary | ICD-10-CM | POA: Diagnosis not present

## 2019-01-22 DIAGNOSIS — F339 Major depressive disorder, recurrent, unspecified: Secondary | ICD-10-CM | POA: Diagnosis not present

## 2019-01-22 DIAGNOSIS — R1312 Dysphagia, oropharyngeal phase: Secondary | ICD-10-CM | POA: Diagnosis not present

## 2019-01-23 DIAGNOSIS — Z418 Encounter for other procedures for purposes other than remedying health state: Secondary | ICD-10-CM | POA: Diagnosis not present

## 2019-01-23 DIAGNOSIS — D631 Anemia in chronic kidney disease: Secondary | ICD-10-CM | POA: Diagnosis not present

## 2019-01-23 DIAGNOSIS — M24542 Contracture, left hand: Secondary | ICD-10-CM | POA: Diagnosis not present

## 2019-01-23 DIAGNOSIS — R1312 Dysphagia, oropharyngeal phase: Secondary | ICD-10-CM | POA: Diagnosis not present

## 2019-01-23 DIAGNOSIS — M24522 Contracture, left elbow: Secondary | ICD-10-CM | POA: Diagnosis not present

## 2019-01-23 DIAGNOSIS — N2581 Secondary hyperparathyroidism of renal origin: Secondary | ICD-10-CM | POA: Diagnosis not present

## 2019-01-23 DIAGNOSIS — F339 Major depressive disorder, recurrent, unspecified: Secondary | ICD-10-CM | POA: Diagnosis not present

## 2019-01-23 DIAGNOSIS — I639 Cerebral infarction, unspecified: Secondary | ICD-10-CM | POA: Diagnosis not present

## 2019-01-23 DIAGNOSIS — N186 End stage renal disease: Secondary | ICD-10-CM | POA: Diagnosis not present

## 2019-01-23 DIAGNOSIS — I69954 Hemiplegia and hemiparesis following unspecified cerebrovascular disease affecting left non-dominant side: Secondary | ICD-10-CM | POA: Diagnosis not present

## 2019-01-24 DIAGNOSIS — M24522 Contracture, left elbow: Secondary | ICD-10-CM | POA: Diagnosis not present

## 2019-01-24 DIAGNOSIS — M24542 Contracture, left hand: Secondary | ICD-10-CM | POA: Diagnosis not present

## 2019-01-24 DIAGNOSIS — F339 Major depressive disorder, recurrent, unspecified: Secondary | ICD-10-CM | POA: Diagnosis not present

## 2019-01-24 DIAGNOSIS — I639 Cerebral infarction, unspecified: Secondary | ICD-10-CM | POA: Diagnosis not present

## 2019-01-24 DIAGNOSIS — I69954 Hemiplegia and hemiparesis following unspecified cerebrovascular disease affecting left non-dominant side: Secondary | ICD-10-CM | POA: Diagnosis not present

## 2019-01-24 DIAGNOSIS — R1312 Dysphagia, oropharyngeal phase: Secondary | ICD-10-CM | POA: Diagnosis not present

## 2019-01-25 DIAGNOSIS — M24522 Contracture, left elbow: Secondary | ICD-10-CM | POA: Diagnosis not present

## 2019-01-25 DIAGNOSIS — R1312 Dysphagia, oropharyngeal phase: Secondary | ICD-10-CM | POA: Diagnosis not present

## 2019-01-25 DIAGNOSIS — M24542 Contracture, left hand: Secondary | ICD-10-CM | POA: Diagnosis not present

## 2019-01-25 DIAGNOSIS — I69954 Hemiplegia and hemiparesis following unspecified cerebrovascular disease affecting left non-dominant side: Secondary | ICD-10-CM | POA: Diagnosis not present

## 2019-01-25 DIAGNOSIS — F339 Major depressive disorder, recurrent, unspecified: Secondary | ICD-10-CM | POA: Diagnosis not present

## 2019-01-25 DIAGNOSIS — I639 Cerebral infarction, unspecified: Secondary | ICD-10-CM | POA: Diagnosis not present

## 2019-01-26 DIAGNOSIS — Z418 Encounter for other procedures for purposes other than remedying health state: Secondary | ICD-10-CM | POA: Diagnosis not present

## 2019-01-26 DIAGNOSIS — F339 Major depressive disorder, recurrent, unspecified: Secondary | ICD-10-CM | POA: Diagnosis not present

## 2019-01-26 DIAGNOSIS — D631 Anemia in chronic kidney disease: Secondary | ICD-10-CM | POA: Diagnosis not present

## 2019-01-26 DIAGNOSIS — N2581 Secondary hyperparathyroidism of renal origin: Secondary | ICD-10-CM | POA: Diagnosis not present

## 2019-01-26 DIAGNOSIS — M24542 Contracture, left hand: Secondary | ICD-10-CM | POA: Diagnosis not present

## 2019-01-26 DIAGNOSIS — M24522 Contracture, left elbow: Secondary | ICD-10-CM | POA: Diagnosis not present

## 2019-01-26 DIAGNOSIS — N186 End stage renal disease: Secondary | ICD-10-CM | POA: Diagnosis not present

## 2019-01-26 DIAGNOSIS — I639 Cerebral infarction, unspecified: Secondary | ICD-10-CM | POA: Diagnosis not present

## 2019-01-26 DIAGNOSIS — R1312 Dysphagia, oropharyngeal phase: Secondary | ICD-10-CM | POA: Diagnosis not present

## 2019-01-26 DIAGNOSIS — I69954 Hemiplegia and hemiparesis following unspecified cerebrovascular disease affecting left non-dominant side: Secondary | ICD-10-CM | POA: Diagnosis not present

## 2019-01-27 DIAGNOSIS — I639 Cerebral infarction, unspecified: Secondary | ICD-10-CM | POA: Diagnosis not present

## 2019-01-27 DIAGNOSIS — M24542 Contracture, left hand: Secondary | ICD-10-CM | POA: Diagnosis not present

## 2019-01-27 DIAGNOSIS — M24522 Contracture, left elbow: Secondary | ICD-10-CM | POA: Diagnosis not present

## 2019-01-27 DIAGNOSIS — F339 Major depressive disorder, recurrent, unspecified: Secondary | ICD-10-CM | POA: Diagnosis not present

## 2019-01-27 DIAGNOSIS — R1312 Dysphagia, oropharyngeal phase: Secondary | ICD-10-CM | POA: Diagnosis not present

## 2019-01-27 DIAGNOSIS — I69954 Hemiplegia and hemiparesis following unspecified cerebrovascular disease affecting left non-dominant side: Secondary | ICD-10-CM | POA: Diagnosis not present

## 2019-01-28 DIAGNOSIS — I639 Cerebral infarction, unspecified: Secondary | ICD-10-CM | POA: Diagnosis not present

## 2019-01-28 DIAGNOSIS — M24522 Contracture, left elbow: Secondary | ICD-10-CM | POA: Diagnosis not present

## 2019-01-28 DIAGNOSIS — M24542 Contracture, left hand: Secondary | ICD-10-CM | POA: Diagnosis not present

## 2019-01-28 DIAGNOSIS — D631 Anemia in chronic kidney disease: Secondary | ICD-10-CM | POA: Diagnosis not present

## 2019-01-28 DIAGNOSIS — I69954 Hemiplegia and hemiparesis following unspecified cerebrovascular disease affecting left non-dominant side: Secondary | ICD-10-CM | POA: Diagnosis not present

## 2019-01-28 DIAGNOSIS — F339 Major depressive disorder, recurrent, unspecified: Secondary | ICD-10-CM | POA: Diagnosis not present

## 2019-01-28 DIAGNOSIS — Z418 Encounter for other procedures for purposes other than remedying health state: Secondary | ICD-10-CM | POA: Diagnosis not present

## 2019-01-28 DIAGNOSIS — R1312 Dysphagia, oropharyngeal phase: Secondary | ICD-10-CM | POA: Diagnosis not present

## 2019-01-28 DIAGNOSIS — N2581 Secondary hyperparathyroidism of renal origin: Secondary | ICD-10-CM | POA: Diagnosis not present

## 2019-01-28 DIAGNOSIS — N186 End stage renal disease: Secondary | ICD-10-CM | POA: Diagnosis not present

## 2019-01-29 DIAGNOSIS — I639 Cerebral infarction, unspecified: Secondary | ICD-10-CM | POA: Diagnosis not present

## 2019-01-29 DIAGNOSIS — M24522 Contracture, left elbow: Secondary | ICD-10-CM | POA: Diagnosis not present

## 2019-01-29 DIAGNOSIS — I69954 Hemiplegia and hemiparesis following unspecified cerebrovascular disease affecting left non-dominant side: Secondary | ICD-10-CM | POA: Diagnosis not present

## 2019-01-29 DIAGNOSIS — M24542 Contracture, left hand: Secondary | ICD-10-CM | POA: Diagnosis not present

## 2019-01-29 DIAGNOSIS — F339 Major depressive disorder, recurrent, unspecified: Secondary | ICD-10-CM | POA: Diagnosis not present

## 2019-01-29 DIAGNOSIS — R1312 Dysphagia, oropharyngeal phase: Secondary | ICD-10-CM | POA: Diagnosis not present

## 2019-01-30 DIAGNOSIS — M24542 Contracture, left hand: Secondary | ICD-10-CM | POA: Diagnosis not present

## 2019-01-30 DIAGNOSIS — F339 Major depressive disorder, recurrent, unspecified: Secondary | ICD-10-CM | POA: Diagnosis not present

## 2019-01-30 DIAGNOSIS — D631 Anemia in chronic kidney disease: Secondary | ICD-10-CM | POA: Diagnosis not present

## 2019-01-30 DIAGNOSIS — M24522 Contracture, left elbow: Secondary | ICD-10-CM | POA: Diagnosis not present

## 2019-01-30 DIAGNOSIS — I69954 Hemiplegia and hemiparesis following unspecified cerebrovascular disease affecting left non-dominant side: Secondary | ICD-10-CM | POA: Diagnosis not present

## 2019-01-30 DIAGNOSIS — N2581 Secondary hyperparathyroidism of renal origin: Secondary | ICD-10-CM | POA: Diagnosis not present

## 2019-01-30 DIAGNOSIS — Z418 Encounter for other procedures for purposes other than remedying health state: Secondary | ICD-10-CM | POA: Diagnosis not present

## 2019-01-30 DIAGNOSIS — R1312 Dysphagia, oropharyngeal phase: Secondary | ICD-10-CM | POA: Diagnosis not present

## 2019-01-30 DIAGNOSIS — I639 Cerebral infarction, unspecified: Secondary | ICD-10-CM | POA: Diagnosis not present

## 2019-01-30 DIAGNOSIS — N186 End stage renal disease: Secondary | ICD-10-CM | POA: Diagnosis not present

## 2019-02-02 DIAGNOSIS — N2581 Secondary hyperparathyroidism of renal origin: Secondary | ICD-10-CM | POA: Diagnosis not present

## 2019-02-02 DIAGNOSIS — I639 Cerebral infarction, unspecified: Secondary | ICD-10-CM | POA: Diagnosis not present

## 2019-02-02 DIAGNOSIS — M24542 Contracture, left hand: Secondary | ICD-10-CM | POA: Diagnosis not present

## 2019-02-02 DIAGNOSIS — I69954 Hemiplegia and hemiparesis following unspecified cerebrovascular disease affecting left non-dominant side: Secondary | ICD-10-CM | POA: Diagnosis not present

## 2019-02-02 DIAGNOSIS — M24522 Contracture, left elbow: Secondary | ICD-10-CM | POA: Diagnosis not present

## 2019-02-02 DIAGNOSIS — R1312 Dysphagia, oropharyngeal phase: Secondary | ICD-10-CM | POA: Diagnosis not present

## 2019-02-02 DIAGNOSIS — F339 Major depressive disorder, recurrent, unspecified: Secondary | ICD-10-CM | POA: Diagnosis not present

## 2019-02-02 DIAGNOSIS — N186 End stage renal disease: Secondary | ICD-10-CM | POA: Diagnosis not present

## 2019-02-02 DIAGNOSIS — D631 Anemia in chronic kidney disease: Secondary | ICD-10-CM | POA: Diagnosis not present

## 2019-02-02 DIAGNOSIS — Z418 Encounter for other procedures for purposes other than remedying health state: Secondary | ICD-10-CM | POA: Diagnosis not present

## 2019-02-03 DIAGNOSIS — M24542 Contracture, left hand: Secondary | ICD-10-CM | POA: Diagnosis not present

## 2019-02-03 DIAGNOSIS — R1312 Dysphagia, oropharyngeal phase: Secondary | ICD-10-CM | POA: Diagnosis not present

## 2019-02-03 DIAGNOSIS — I69954 Hemiplegia and hemiparesis following unspecified cerebrovascular disease affecting left non-dominant side: Secondary | ICD-10-CM | POA: Diagnosis not present

## 2019-02-03 DIAGNOSIS — I639 Cerebral infarction, unspecified: Secondary | ICD-10-CM | POA: Diagnosis not present

## 2019-02-03 DIAGNOSIS — F339 Major depressive disorder, recurrent, unspecified: Secondary | ICD-10-CM | POA: Diagnosis not present

## 2019-02-03 DIAGNOSIS — M24522 Contracture, left elbow: Secondary | ICD-10-CM | POA: Diagnosis not present

## 2019-02-04 DIAGNOSIS — N2581 Secondary hyperparathyroidism of renal origin: Secondary | ICD-10-CM | POA: Diagnosis not present

## 2019-02-04 DIAGNOSIS — I5022 Chronic systolic (congestive) heart failure: Secondary | ICD-10-CM | POA: Diagnosis not present

## 2019-02-04 DIAGNOSIS — N186 End stage renal disease: Secondary | ICD-10-CM | POA: Diagnosis not present

## 2019-02-04 DIAGNOSIS — D631 Anemia in chronic kidney disease: Secondary | ICD-10-CM | POA: Diagnosis not present

## 2019-02-04 DIAGNOSIS — F339 Major depressive disorder, recurrent, unspecified: Secondary | ICD-10-CM | POA: Diagnosis not present

## 2019-02-04 DIAGNOSIS — I69954 Hemiplegia and hemiparesis following unspecified cerebrovascular disease affecting left non-dominant side: Secondary | ICD-10-CM | POA: Diagnosis not present

## 2019-02-04 DIAGNOSIS — R1312 Dysphagia, oropharyngeal phase: Secondary | ICD-10-CM | POA: Diagnosis not present

## 2019-02-04 DIAGNOSIS — M24542 Contracture, left hand: Secondary | ICD-10-CM | POA: Diagnosis not present

## 2019-02-04 DIAGNOSIS — M24522 Contracture, left elbow: Secondary | ICD-10-CM | POA: Diagnosis not present

## 2019-02-04 DIAGNOSIS — Z418 Encounter for other procedures for purposes other than remedying health state: Secondary | ICD-10-CM | POA: Diagnosis not present

## 2019-02-04 DIAGNOSIS — M150 Primary generalized (osteo)arthritis: Secondary | ICD-10-CM | POA: Diagnosis not present

## 2019-02-04 DIAGNOSIS — I10 Essential (primary) hypertension: Secondary | ICD-10-CM | POA: Diagnosis not present

## 2019-02-04 DIAGNOSIS — I639 Cerebral infarction, unspecified: Secondary | ICD-10-CM | POA: Diagnosis not present

## 2019-02-04 DIAGNOSIS — E1122 Type 2 diabetes mellitus with diabetic chronic kidney disease: Secondary | ICD-10-CM | POA: Diagnosis not present

## 2019-02-05 DIAGNOSIS — F339 Major depressive disorder, recurrent, unspecified: Secondary | ICD-10-CM | POA: Diagnosis not present

## 2019-02-05 DIAGNOSIS — I69954 Hemiplegia and hemiparesis following unspecified cerebrovascular disease affecting left non-dominant side: Secondary | ICD-10-CM | POA: Diagnosis not present

## 2019-02-05 DIAGNOSIS — M24522 Contracture, left elbow: Secondary | ICD-10-CM | POA: Diagnosis not present

## 2019-02-05 DIAGNOSIS — R1312 Dysphagia, oropharyngeal phase: Secondary | ICD-10-CM | POA: Diagnosis not present

## 2019-02-05 DIAGNOSIS — I639 Cerebral infarction, unspecified: Secondary | ICD-10-CM | POA: Diagnosis not present

## 2019-02-05 DIAGNOSIS — I5022 Chronic systolic (congestive) heart failure: Secondary | ICD-10-CM | POA: Diagnosis not present

## 2019-02-05 DIAGNOSIS — M24542 Contracture, left hand: Secondary | ICD-10-CM | POA: Diagnosis not present

## 2019-02-05 DIAGNOSIS — N186 End stage renal disease: Secondary | ICD-10-CM | POA: Diagnosis not present

## 2019-02-06 DIAGNOSIS — F339 Major depressive disorder, recurrent, unspecified: Secondary | ICD-10-CM | POA: Diagnosis not present

## 2019-02-06 DIAGNOSIS — R1312 Dysphagia, oropharyngeal phase: Secondary | ICD-10-CM | POA: Diagnosis not present

## 2019-02-06 DIAGNOSIS — Z418 Encounter for other procedures for purposes other than remedying health state: Secondary | ICD-10-CM | POA: Diagnosis not present

## 2019-02-06 DIAGNOSIS — M24542 Contracture, left hand: Secondary | ICD-10-CM | POA: Diagnosis not present

## 2019-02-06 DIAGNOSIS — N2581 Secondary hyperparathyroidism of renal origin: Secondary | ICD-10-CM | POA: Diagnosis not present

## 2019-02-06 DIAGNOSIS — I639 Cerebral infarction, unspecified: Secondary | ICD-10-CM | POA: Diagnosis not present

## 2019-02-06 DIAGNOSIS — M24522 Contracture, left elbow: Secondary | ICD-10-CM | POA: Diagnosis not present

## 2019-02-06 DIAGNOSIS — D631 Anemia in chronic kidney disease: Secondary | ICD-10-CM | POA: Diagnosis not present

## 2019-02-06 DIAGNOSIS — I69954 Hemiplegia and hemiparesis following unspecified cerebrovascular disease affecting left non-dominant side: Secondary | ICD-10-CM | POA: Diagnosis not present

## 2019-02-06 DIAGNOSIS — N186 End stage renal disease: Secondary | ICD-10-CM | POA: Diagnosis not present

## 2019-02-09 DIAGNOSIS — M24522 Contracture, left elbow: Secondary | ICD-10-CM | POA: Diagnosis not present

## 2019-02-09 DIAGNOSIS — F339 Major depressive disorder, recurrent, unspecified: Secondary | ICD-10-CM | POA: Diagnosis not present

## 2019-02-09 DIAGNOSIS — I69954 Hemiplegia and hemiparesis following unspecified cerebrovascular disease affecting left non-dominant side: Secondary | ICD-10-CM | POA: Diagnosis not present

## 2019-02-09 DIAGNOSIS — Z418 Encounter for other procedures for purposes other than remedying health state: Secondary | ICD-10-CM | POA: Diagnosis not present

## 2019-02-09 DIAGNOSIS — N186 End stage renal disease: Secondary | ICD-10-CM | POA: Diagnosis not present

## 2019-02-09 DIAGNOSIS — I639 Cerebral infarction, unspecified: Secondary | ICD-10-CM | POA: Diagnosis not present

## 2019-02-09 DIAGNOSIS — R1312 Dysphagia, oropharyngeal phase: Secondary | ICD-10-CM | POA: Diagnosis not present

## 2019-02-09 DIAGNOSIS — D631 Anemia in chronic kidney disease: Secondary | ICD-10-CM | POA: Diagnosis not present

## 2019-02-09 DIAGNOSIS — M24542 Contracture, left hand: Secondary | ICD-10-CM | POA: Diagnosis not present

## 2019-02-09 DIAGNOSIS — N2581 Secondary hyperparathyroidism of renal origin: Secondary | ICD-10-CM | POA: Diagnosis not present

## 2019-02-10 DIAGNOSIS — M24542 Contracture, left hand: Secondary | ICD-10-CM | POA: Diagnosis not present

## 2019-02-10 DIAGNOSIS — E559 Vitamin D deficiency, unspecified: Secondary | ICD-10-CM | POA: Diagnosis not present

## 2019-02-10 DIAGNOSIS — I482 Chronic atrial fibrillation, unspecified: Secondary | ICD-10-CM | POA: Diagnosis not present

## 2019-02-10 DIAGNOSIS — R1312 Dysphagia, oropharyngeal phase: Secondary | ICD-10-CM | POA: Diagnosis not present

## 2019-02-10 DIAGNOSIS — H548 Legal blindness, as defined in USA: Secondary | ICD-10-CM | POA: Diagnosis not present

## 2019-02-10 DIAGNOSIS — I509 Heart failure, unspecified: Secondary | ICD-10-CM | POA: Diagnosis not present

## 2019-02-10 DIAGNOSIS — N186 End stage renal disease: Secondary | ICD-10-CM | POA: Diagnosis not present

## 2019-02-10 DIAGNOSIS — I429 Cardiomyopathy, unspecified: Secondary | ICD-10-CM | POA: Diagnosis not present

## 2019-02-10 DIAGNOSIS — I639 Cerebral infarction, unspecified: Secondary | ICD-10-CM | POA: Diagnosis not present

## 2019-02-10 DIAGNOSIS — Z89612 Acquired absence of left leg above knee: Secondary | ICD-10-CM | POA: Diagnosis not present

## 2019-02-10 DIAGNOSIS — E875 Hyperkalemia: Secondary | ICD-10-CM | POA: Diagnosis not present

## 2019-02-10 DIAGNOSIS — E785 Hyperlipidemia, unspecified: Secondary | ICD-10-CM | POA: Diagnosis not present

## 2019-02-10 DIAGNOSIS — R569 Unspecified convulsions: Secondary | ICD-10-CM | POA: Diagnosis not present

## 2019-02-10 DIAGNOSIS — K5900 Constipation, unspecified: Secondary | ICD-10-CM | POA: Diagnosis not present

## 2019-02-10 DIAGNOSIS — E1169 Type 2 diabetes mellitus with other specified complication: Secondary | ICD-10-CM | POA: Diagnosis not present

## 2019-02-10 DIAGNOSIS — I69954 Hemiplegia and hemiparesis following unspecified cerebrovascular disease affecting left non-dominant side: Secondary | ICD-10-CM | POA: Diagnosis not present

## 2019-02-10 DIAGNOSIS — Z89511 Acquired absence of right leg below knee: Secondary | ICD-10-CM | POA: Diagnosis not present

## 2019-02-10 DIAGNOSIS — F339 Major depressive disorder, recurrent, unspecified: Secondary | ICD-10-CM | POA: Diagnosis not present

## 2019-02-10 DIAGNOSIS — E1122 Type 2 diabetes mellitus with diabetic chronic kidney disease: Secondary | ICD-10-CM | POA: Diagnosis not present

## 2019-02-10 DIAGNOSIS — M24522 Contracture, left elbow: Secondary | ICD-10-CM | POA: Diagnosis not present

## 2019-02-10 DIAGNOSIS — Z992 Dependence on renal dialysis: Secondary | ICD-10-CM | POA: Diagnosis not present

## 2019-02-10 DIAGNOSIS — I739 Peripheral vascular disease, unspecified: Secondary | ICD-10-CM | POA: Diagnosis not present

## 2019-02-10 DIAGNOSIS — I10 Essential (primary) hypertension: Secondary | ICD-10-CM | POA: Diagnosis not present

## 2019-02-10 DIAGNOSIS — D631 Anemia in chronic kidney disease: Secondary | ICD-10-CM | POA: Diagnosis not present

## 2019-02-11 DIAGNOSIS — N186 End stage renal disease: Secondary | ICD-10-CM | POA: Diagnosis not present

## 2019-02-11 DIAGNOSIS — Z418 Encounter for other procedures for purposes other than remedying health state: Secondary | ICD-10-CM | POA: Diagnosis not present

## 2019-02-11 DIAGNOSIS — T80219A Unspecified infection due to central venous catheter, initial encounter: Secondary | ICD-10-CM | POA: Diagnosis not present

## 2019-02-11 DIAGNOSIS — Z23 Encounter for immunization: Secondary | ICD-10-CM | POA: Diagnosis not present

## 2019-02-11 DIAGNOSIS — D631 Anemia in chronic kidney disease: Secondary | ICD-10-CM | POA: Diagnosis not present

## 2019-02-11 DIAGNOSIS — N2581 Secondary hyperparathyroidism of renal origin: Secondary | ICD-10-CM | POA: Diagnosis not present

## 2019-02-12 DIAGNOSIS — R1312 Dysphagia, oropharyngeal phase: Secondary | ICD-10-CM | POA: Diagnosis not present

## 2019-02-12 DIAGNOSIS — M24542 Contracture, left hand: Secondary | ICD-10-CM | POA: Diagnosis not present

## 2019-02-12 DIAGNOSIS — I639 Cerebral infarction, unspecified: Secondary | ICD-10-CM | POA: Diagnosis not present

## 2019-02-12 DIAGNOSIS — I69954 Hemiplegia and hemiparesis following unspecified cerebrovascular disease affecting left non-dominant side: Secondary | ICD-10-CM | POA: Diagnosis not present

## 2019-02-12 DIAGNOSIS — F339 Major depressive disorder, recurrent, unspecified: Secondary | ICD-10-CM | POA: Diagnosis not present

## 2019-02-12 DIAGNOSIS — M24522 Contracture, left elbow: Secondary | ICD-10-CM | POA: Diagnosis not present

## 2019-02-13 DIAGNOSIS — M24542 Contracture, left hand: Secondary | ICD-10-CM | POA: Diagnosis not present

## 2019-02-13 DIAGNOSIS — Z23 Encounter for immunization: Secondary | ICD-10-CM | POA: Diagnosis not present

## 2019-02-13 DIAGNOSIS — N2581 Secondary hyperparathyroidism of renal origin: Secondary | ICD-10-CM | POA: Diagnosis not present

## 2019-02-13 DIAGNOSIS — I639 Cerebral infarction, unspecified: Secondary | ICD-10-CM | POA: Diagnosis not present

## 2019-02-13 DIAGNOSIS — I69954 Hemiplegia and hemiparesis following unspecified cerebrovascular disease affecting left non-dominant side: Secondary | ICD-10-CM | POA: Diagnosis not present

## 2019-02-13 DIAGNOSIS — R1312 Dysphagia, oropharyngeal phase: Secondary | ICD-10-CM | POA: Diagnosis not present

## 2019-02-13 DIAGNOSIS — N186 End stage renal disease: Secondary | ICD-10-CM | POA: Diagnosis not present

## 2019-02-13 DIAGNOSIS — Z418 Encounter for other procedures for purposes other than remedying health state: Secondary | ICD-10-CM | POA: Diagnosis not present

## 2019-02-13 DIAGNOSIS — M24522 Contracture, left elbow: Secondary | ICD-10-CM | POA: Diagnosis not present

## 2019-02-13 DIAGNOSIS — D631 Anemia in chronic kidney disease: Secondary | ICD-10-CM | POA: Diagnosis not present

## 2019-02-13 DIAGNOSIS — T80219A Unspecified infection due to central venous catheter, initial encounter: Secondary | ICD-10-CM | POA: Diagnosis not present

## 2019-02-13 DIAGNOSIS — F339 Major depressive disorder, recurrent, unspecified: Secondary | ICD-10-CM | POA: Diagnosis not present

## 2019-02-16 DIAGNOSIS — N185 Chronic kidney disease, stage 5: Secondary | ICD-10-CM | POA: Diagnosis not present

## 2019-02-16 DIAGNOSIS — I5022 Chronic systolic (congestive) heart failure: Secondary | ICD-10-CM | POA: Diagnosis not present

## 2019-02-16 DIAGNOSIS — Z418 Encounter for other procedures for purposes other than remedying health state: Secondary | ICD-10-CM | POA: Diagnosis not present

## 2019-02-16 DIAGNOSIS — T80219A Unspecified infection due to central venous catheter, initial encounter: Secondary | ICD-10-CM | POA: Diagnosis not present

## 2019-02-16 DIAGNOSIS — E1122 Type 2 diabetes mellitus with diabetic chronic kidney disease: Secondary | ICD-10-CM | POA: Diagnosis not present

## 2019-02-16 DIAGNOSIS — Z23 Encounter for immunization: Secondary | ICD-10-CM | POA: Diagnosis not present

## 2019-02-16 DIAGNOSIS — N186 End stage renal disease: Secondary | ICD-10-CM | POA: Diagnosis not present

## 2019-02-16 DIAGNOSIS — I69351 Hemiplegia and hemiparesis following cerebral infarction affecting right dominant side: Secondary | ICD-10-CM | POA: Diagnosis not present

## 2019-02-16 DIAGNOSIS — N2581 Secondary hyperparathyroidism of renal origin: Secondary | ICD-10-CM | POA: Diagnosis not present

## 2019-02-16 DIAGNOSIS — D631 Anemia in chronic kidney disease: Secondary | ICD-10-CM | POA: Diagnosis not present

## 2019-02-17 DIAGNOSIS — I639 Cerebral infarction, unspecified: Secondary | ICD-10-CM | POA: Diagnosis not present

## 2019-02-17 DIAGNOSIS — M24522 Contracture, left elbow: Secondary | ICD-10-CM | POA: Diagnosis not present

## 2019-02-17 DIAGNOSIS — F339 Major depressive disorder, recurrent, unspecified: Secondary | ICD-10-CM | POA: Diagnosis not present

## 2019-02-17 DIAGNOSIS — R1312 Dysphagia, oropharyngeal phase: Secondary | ICD-10-CM | POA: Diagnosis not present

## 2019-02-17 DIAGNOSIS — I69954 Hemiplegia and hemiparesis following unspecified cerebrovascular disease affecting left non-dominant side: Secondary | ICD-10-CM | POA: Diagnosis not present

## 2019-02-17 DIAGNOSIS — M24542 Contracture, left hand: Secondary | ICD-10-CM | POA: Diagnosis not present

## 2019-02-18 DIAGNOSIS — Z23 Encounter for immunization: Secondary | ICD-10-CM | POA: Diagnosis not present

## 2019-02-18 DIAGNOSIS — N186 End stage renal disease: Secondary | ICD-10-CM | POA: Diagnosis not present

## 2019-02-18 DIAGNOSIS — T80219A Unspecified infection due to central venous catheter, initial encounter: Secondary | ICD-10-CM | POA: Diagnosis not present

## 2019-02-18 DIAGNOSIS — N2581 Secondary hyperparathyroidism of renal origin: Secondary | ICD-10-CM | POA: Diagnosis not present

## 2019-02-18 DIAGNOSIS — Z418 Encounter for other procedures for purposes other than remedying health state: Secondary | ICD-10-CM | POA: Diagnosis not present

## 2019-02-18 DIAGNOSIS — D631 Anemia in chronic kidney disease: Secondary | ICD-10-CM | POA: Diagnosis not present

## 2019-02-20 DIAGNOSIS — T80219A Unspecified infection due to central venous catheter, initial encounter: Secondary | ICD-10-CM | POA: Diagnosis not present

## 2019-02-20 DIAGNOSIS — N2581 Secondary hyperparathyroidism of renal origin: Secondary | ICD-10-CM | POA: Diagnosis not present

## 2019-02-20 DIAGNOSIS — Z23 Encounter for immunization: Secondary | ICD-10-CM | POA: Diagnosis not present

## 2019-02-20 DIAGNOSIS — N186 End stage renal disease: Secondary | ICD-10-CM | POA: Diagnosis not present

## 2019-02-20 DIAGNOSIS — D631 Anemia in chronic kidney disease: Secondary | ICD-10-CM | POA: Diagnosis not present

## 2019-02-20 DIAGNOSIS — Z418 Encounter for other procedures for purposes other than remedying health state: Secondary | ICD-10-CM | POA: Diagnosis not present

## 2019-02-23 DIAGNOSIS — Z23 Encounter for immunization: Secondary | ICD-10-CM | POA: Diagnosis not present

## 2019-02-23 DIAGNOSIS — T80219A Unspecified infection due to central venous catheter, initial encounter: Secondary | ICD-10-CM | POA: Diagnosis not present

## 2019-02-23 DIAGNOSIS — N2581 Secondary hyperparathyroidism of renal origin: Secondary | ICD-10-CM | POA: Diagnosis not present

## 2019-02-23 DIAGNOSIS — Z418 Encounter for other procedures for purposes other than remedying health state: Secondary | ICD-10-CM | POA: Diagnosis not present

## 2019-02-23 DIAGNOSIS — N186 End stage renal disease: Secondary | ICD-10-CM | POA: Diagnosis not present

## 2019-02-23 DIAGNOSIS — D631 Anemia in chronic kidney disease: Secondary | ICD-10-CM | POA: Diagnosis not present

## 2019-02-25 DIAGNOSIS — N2581 Secondary hyperparathyroidism of renal origin: Secondary | ICD-10-CM | POA: Diagnosis not present

## 2019-02-25 DIAGNOSIS — T80219A Unspecified infection due to central venous catheter, initial encounter: Secondary | ICD-10-CM | POA: Diagnosis not present

## 2019-02-25 DIAGNOSIS — N186 End stage renal disease: Secondary | ICD-10-CM | POA: Diagnosis not present

## 2019-02-25 DIAGNOSIS — Z23 Encounter for immunization: Secondary | ICD-10-CM | POA: Diagnosis not present

## 2019-02-25 DIAGNOSIS — Z418 Encounter for other procedures for purposes other than remedying health state: Secondary | ICD-10-CM | POA: Diagnosis not present

## 2019-02-25 DIAGNOSIS — D631 Anemia in chronic kidney disease: Secondary | ICD-10-CM | POA: Diagnosis not present

## 2019-02-26 DIAGNOSIS — I10 Essential (primary) hypertension: Secondary | ICD-10-CM | POA: Diagnosis not present

## 2019-02-26 DIAGNOSIS — I517 Cardiomegaly: Secondary | ICD-10-CM | POA: Diagnosis not present

## 2019-02-26 DIAGNOSIS — D649 Anemia, unspecified: Secondary | ICD-10-CM | POA: Diagnosis not present

## 2019-02-26 DIAGNOSIS — R5381 Other malaise: Secondary | ICD-10-CM | POA: Diagnosis not present

## 2019-02-26 DIAGNOSIS — R05 Cough: Secondary | ICD-10-CM | POA: Diagnosis not present

## 2019-02-27 DIAGNOSIS — Z418 Encounter for other procedures for purposes other than remedying health state: Secondary | ICD-10-CM | POA: Diagnosis not present

## 2019-02-27 DIAGNOSIS — D631 Anemia in chronic kidney disease: Secondary | ICD-10-CM | POA: Diagnosis not present

## 2019-02-27 DIAGNOSIS — N2581 Secondary hyperparathyroidism of renal origin: Secondary | ICD-10-CM | POA: Diagnosis not present

## 2019-02-27 DIAGNOSIS — N186 End stage renal disease: Secondary | ICD-10-CM | POA: Diagnosis not present

## 2019-02-27 DIAGNOSIS — Z23 Encounter for immunization: Secondary | ICD-10-CM | POA: Diagnosis not present

## 2019-02-27 DIAGNOSIS — T80219A Unspecified infection due to central venous catheter, initial encounter: Secondary | ICD-10-CM | POA: Diagnosis not present

## 2019-03-02 DIAGNOSIS — N186 End stage renal disease: Secondary | ICD-10-CM | POA: Diagnosis not present

## 2019-03-02 DIAGNOSIS — T80219A Unspecified infection due to central venous catheter, initial encounter: Secondary | ICD-10-CM | POA: Diagnosis not present

## 2019-03-02 DIAGNOSIS — N2581 Secondary hyperparathyroidism of renal origin: Secondary | ICD-10-CM | POA: Diagnosis not present

## 2019-03-02 DIAGNOSIS — D631 Anemia in chronic kidney disease: Secondary | ICD-10-CM | POA: Diagnosis not present

## 2019-03-02 DIAGNOSIS — Z418 Encounter for other procedures for purposes other than remedying health state: Secondary | ICD-10-CM | POA: Diagnosis not present

## 2019-03-02 DIAGNOSIS — Z23 Encounter for immunization: Secondary | ICD-10-CM | POA: Diagnosis not present

## 2019-03-04 DIAGNOSIS — D631 Anemia in chronic kidney disease: Secondary | ICD-10-CM | POA: Diagnosis not present

## 2019-03-04 DIAGNOSIS — I5022 Chronic systolic (congestive) heart failure: Secondary | ICD-10-CM | POA: Diagnosis not present

## 2019-03-04 DIAGNOSIS — T80219A Unspecified infection due to central venous catheter, initial encounter: Secondary | ICD-10-CM | POA: Diagnosis not present

## 2019-03-04 DIAGNOSIS — Z23 Encounter for immunization: Secondary | ICD-10-CM | POA: Diagnosis not present

## 2019-03-04 DIAGNOSIS — Z418 Encounter for other procedures for purposes other than remedying health state: Secondary | ICD-10-CM | POA: Diagnosis not present

## 2019-03-04 DIAGNOSIS — N186 End stage renal disease: Secondary | ICD-10-CM | POA: Diagnosis not present

## 2019-03-04 DIAGNOSIS — I10 Essential (primary) hypertension: Secondary | ICD-10-CM | POA: Diagnosis not present

## 2019-03-04 DIAGNOSIS — M150 Primary generalized (osteo)arthritis: Secondary | ICD-10-CM | POA: Diagnosis not present

## 2019-03-04 DIAGNOSIS — N2581 Secondary hyperparathyroidism of renal origin: Secondary | ICD-10-CM | POA: Diagnosis not present

## 2019-03-04 DIAGNOSIS — E1122 Type 2 diabetes mellitus with diabetic chronic kidney disease: Secondary | ICD-10-CM | POA: Diagnosis not present

## 2019-03-05 DIAGNOSIS — N186 End stage renal disease: Secondary | ICD-10-CM | POA: Diagnosis not present

## 2019-03-05 DIAGNOSIS — I10 Essential (primary) hypertension: Secondary | ICD-10-CM | POA: Diagnosis not present

## 2019-03-05 DIAGNOSIS — I5022 Chronic systolic (congestive) heart failure: Secondary | ICD-10-CM | POA: Diagnosis not present

## 2019-03-05 DIAGNOSIS — G40909 Epilepsy, unspecified, not intractable, without status epilepticus: Secondary | ICD-10-CM | POA: Diagnosis not present

## 2019-03-06 DIAGNOSIS — N186 End stage renal disease: Secondary | ICD-10-CM | POA: Diagnosis not present

## 2019-03-06 DIAGNOSIS — D631 Anemia in chronic kidney disease: Secondary | ICD-10-CM | POA: Diagnosis not present

## 2019-03-06 DIAGNOSIS — Z418 Encounter for other procedures for purposes other than remedying health state: Secondary | ICD-10-CM | POA: Diagnosis not present

## 2019-03-06 DIAGNOSIS — N2581 Secondary hyperparathyroidism of renal origin: Secondary | ICD-10-CM | POA: Diagnosis not present

## 2019-03-06 DIAGNOSIS — Z23 Encounter for immunization: Secondary | ICD-10-CM | POA: Diagnosis not present

## 2019-03-06 DIAGNOSIS — T80219A Unspecified infection due to central venous catheter, initial encounter: Secondary | ICD-10-CM | POA: Diagnosis not present

## 2019-03-09 DIAGNOSIS — T80219A Unspecified infection due to central venous catheter, initial encounter: Secondary | ICD-10-CM | POA: Diagnosis not present

## 2019-03-09 DIAGNOSIS — N2581 Secondary hyperparathyroidism of renal origin: Secondary | ICD-10-CM | POA: Diagnosis not present

## 2019-03-09 DIAGNOSIS — Z418 Encounter for other procedures for purposes other than remedying health state: Secondary | ICD-10-CM | POA: Diagnosis not present

## 2019-03-09 DIAGNOSIS — Z23 Encounter for immunization: Secondary | ICD-10-CM | POA: Diagnosis not present

## 2019-03-09 DIAGNOSIS — D631 Anemia in chronic kidney disease: Secondary | ICD-10-CM | POA: Diagnosis not present

## 2019-03-09 DIAGNOSIS — N186 End stage renal disease: Secondary | ICD-10-CM | POA: Diagnosis not present

## 2019-03-11 DIAGNOSIS — T80219A Unspecified infection due to central venous catheter, initial encounter: Secondary | ICD-10-CM | POA: Diagnosis not present

## 2019-03-11 DIAGNOSIS — D631 Anemia in chronic kidney disease: Secondary | ICD-10-CM | POA: Diagnosis not present

## 2019-03-11 DIAGNOSIS — N186 End stage renal disease: Secondary | ICD-10-CM | POA: Diagnosis not present

## 2019-03-11 DIAGNOSIS — Z23 Encounter for immunization: Secondary | ICD-10-CM | POA: Diagnosis not present

## 2019-03-11 DIAGNOSIS — N2581 Secondary hyperparathyroidism of renal origin: Secondary | ICD-10-CM | POA: Diagnosis not present

## 2019-03-11 DIAGNOSIS — Z418 Encounter for other procedures for purposes other than remedying health state: Secondary | ICD-10-CM | POA: Diagnosis not present

## 2019-03-12 DIAGNOSIS — N186 End stage renal disease: Secondary | ICD-10-CM | POA: Diagnosis not present

## 2019-03-12 DIAGNOSIS — I10 Essential (primary) hypertension: Secondary | ICD-10-CM | POA: Diagnosis not present

## 2019-03-12 DIAGNOSIS — D631 Anemia in chronic kidney disease: Secondary | ICD-10-CM | POA: Diagnosis not present

## 2019-03-13 DIAGNOSIS — N186 End stage renal disease: Secondary | ICD-10-CM | POA: Diagnosis not present

## 2019-03-13 DIAGNOSIS — Z418 Encounter for other procedures for purposes other than remedying health state: Secondary | ICD-10-CM | POA: Diagnosis not present

## 2019-03-13 DIAGNOSIS — T80219A Unspecified infection due to central venous catheter, initial encounter: Secondary | ICD-10-CM | POA: Diagnosis not present

## 2019-03-13 DIAGNOSIS — N2581 Secondary hyperparathyroidism of renal origin: Secondary | ICD-10-CM | POA: Diagnosis not present

## 2019-03-13 DIAGNOSIS — D631 Anemia in chronic kidney disease: Secondary | ICD-10-CM | POA: Diagnosis not present

## 2019-03-13 DIAGNOSIS — I10 Essential (primary) hypertension: Secondary | ICD-10-CM | POA: Diagnosis not present

## 2019-03-16 DIAGNOSIS — E119 Type 2 diabetes mellitus without complications: Secondary | ICD-10-CM | POA: Diagnosis not present

## 2019-03-16 DIAGNOSIS — N2581 Secondary hyperparathyroidism of renal origin: Secondary | ICD-10-CM | POA: Diagnosis not present

## 2019-03-16 DIAGNOSIS — I739 Peripheral vascular disease, unspecified: Secondary | ICD-10-CM | POA: Diagnosis not present

## 2019-03-16 DIAGNOSIS — N186 End stage renal disease: Secondary | ICD-10-CM | POA: Diagnosis not present

## 2019-03-16 DIAGNOSIS — Z418 Encounter for other procedures for purposes other than remedying health state: Secondary | ICD-10-CM | POA: Diagnosis not present

## 2019-03-16 DIAGNOSIS — I10 Essential (primary) hypertension: Secondary | ICD-10-CM | POA: Diagnosis not present

## 2019-03-16 DIAGNOSIS — T80219A Unspecified infection due to central venous catheter, initial encounter: Secondary | ICD-10-CM | POA: Diagnosis not present

## 2019-03-16 DIAGNOSIS — D631 Anemia in chronic kidney disease: Secondary | ICD-10-CM | POA: Diagnosis not present

## 2019-03-16 DIAGNOSIS — I69351 Hemiplegia and hemiparesis following cerebral infarction affecting right dominant side: Secondary | ICD-10-CM | POA: Diagnosis not present

## 2019-03-18 DIAGNOSIS — N186 End stage renal disease: Secondary | ICD-10-CM | POA: Diagnosis not present

## 2019-03-18 DIAGNOSIS — Z418 Encounter for other procedures for purposes other than remedying health state: Secondary | ICD-10-CM | POA: Diagnosis not present

## 2019-03-18 DIAGNOSIS — N2581 Secondary hyperparathyroidism of renal origin: Secondary | ICD-10-CM | POA: Diagnosis not present

## 2019-03-18 DIAGNOSIS — T80219A Unspecified infection due to central venous catheter, initial encounter: Secondary | ICD-10-CM | POA: Diagnosis not present

## 2019-03-18 DIAGNOSIS — D631 Anemia in chronic kidney disease: Secondary | ICD-10-CM | POA: Diagnosis not present

## 2019-03-18 DIAGNOSIS — I10 Essential (primary) hypertension: Secondary | ICD-10-CM | POA: Diagnosis not present

## 2019-03-20 DIAGNOSIS — I5022 Chronic systolic (congestive) heart failure: Secondary | ICD-10-CM | POA: Diagnosis not present

## 2019-03-20 DIAGNOSIS — Z23 Encounter for immunization: Secondary | ICD-10-CM | POA: Diagnosis not present

## 2019-03-20 DIAGNOSIS — T80219A Unspecified infection due to central venous catheter, initial encounter: Secondary | ICD-10-CM | POA: Diagnosis not present

## 2019-03-20 DIAGNOSIS — N186 End stage renal disease: Secondary | ICD-10-CM | POA: Diagnosis not present

## 2019-03-20 DIAGNOSIS — D631 Anemia in chronic kidney disease: Secondary | ICD-10-CM | POA: Diagnosis not present

## 2019-03-20 DIAGNOSIS — I10 Essential (primary) hypertension: Secondary | ICD-10-CM | POA: Diagnosis not present

## 2019-03-20 DIAGNOSIS — N2581 Secondary hyperparathyroidism of renal origin: Secondary | ICD-10-CM | POA: Diagnosis not present

## 2019-03-20 DIAGNOSIS — M150 Primary generalized (osteo)arthritis: Secondary | ICD-10-CM | POA: Diagnosis not present

## 2019-03-20 DIAGNOSIS — Z418 Encounter for other procedures for purposes other than remedying health state: Secondary | ICD-10-CM | POA: Diagnosis not present

## 2019-03-20 DIAGNOSIS — E1122 Type 2 diabetes mellitus with diabetic chronic kidney disease: Secondary | ICD-10-CM | POA: Diagnosis not present

## 2019-03-23 DIAGNOSIS — I10 Essential (primary) hypertension: Secondary | ICD-10-CM | POA: Diagnosis not present

## 2019-03-23 DIAGNOSIS — T80219A Unspecified infection due to central venous catheter, initial encounter: Secondary | ICD-10-CM | POA: Diagnosis not present

## 2019-03-23 DIAGNOSIS — N2581 Secondary hyperparathyroidism of renal origin: Secondary | ICD-10-CM | POA: Diagnosis not present

## 2019-03-23 DIAGNOSIS — D631 Anemia in chronic kidney disease: Secondary | ICD-10-CM | POA: Diagnosis not present

## 2019-03-23 DIAGNOSIS — N186 End stage renal disease: Secondary | ICD-10-CM | POA: Diagnosis not present

## 2019-03-23 DIAGNOSIS — Z418 Encounter for other procedures for purposes other than remedying health state: Secondary | ICD-10-CM | POA: Diagnosis not present

## 2019-03-25 DIAGNOSIS — N186 End stage renal disease: Secondary | ICD-10-CM | POA: Diagnosis not present

## 2019-03-25 DIAGNOSIS — Z418 Encounter for other procedures for purposes other than remedying health state: Secondary | ICD-10-CM | POA: Diagnosis not present

## 2019-03-25 DIAGNOSIS — D631 Anemia in chronic kidney disease: Secondary | ICD-10-CM | POA: Diagnosis not present

## 2019-03-25 DIAGNOSIS — I10 Essential (primary) hypertension: Secondary | ICD-10-CM | POA: Diagnosis not present

## 2019-03-25 DIAGNOSIS — T80219A Unspecified infection due to central venous catheter, initial encounter: Secondary | ICD-10-CM | POA: Diagnosis not present

## 2019-03-25 DIAGNOSIS — N2581 Secondary hyperparathyroidism of renal origin: Secondary | ICD-10-CM | POA: Diagnosis not present

## 2019-03-27 DIAGNOSIS — N2581 Secondary hyperparathyroidism of renal origin: Secondary | ICD-10-CM | POA: Diagnosis not present

## 2019-03-27 DIAGNOSIS — N186 End stage renal disease: Secondary | ICD-10-CM | POA: Diagnosis not present

## 2019-03-27 DIAGNOSIS — D631 Anemia in chronic kidney disease: Secondary | ICD-10-CM | POA: Diagnosis not present

## 2019-03-27 DIAGNOSIS — I10 Essential (primary) hypertension: Secondary | ICD-10-CM | POA: Diagnosis not present

## 2019-03-27 DIAGNOSIS — T80219A Unspecified infection due to central venous catheter, initial encounter: Secondary | ICD-10-CM | POA: Diagnosis not present

## 2019-03-27 DIAGNOSIS — Z418 Encounter for other procedures for purposes other than remedying health state: Secondary | ICD-10-CM | POA: Diagnosis not present

## 2019-03-30 DIAGNOSIS — T80219A Unspecified infection due to central venous catheter, initial encounter: Secondary | ICD-10-CM | POA: Diagnosis not present

## 2019-03-30 DIAGNOSIS — N186 End stage renal disease: Secondary | ICD-10-CM | POA: Diagnosis not present

## 2019-03-30 DIAGNOSIS — I10 Essential (primary) hypertension: Secondary | ICD-10-CM | POA: Diagnosis not present

## 2019-03-30 DIAGNOSIS — D631 Anemia in chronic kidney disease: Secondary | ICD-10-CM | POA: Diagnosis not present

## 2019-03-30 DIAGNOSIS — Z418 Encounter for other procedures for purposes other than remedying health state: Secondary | ICD-10-CM | POA: Diagnosis not present

## 2019-03-30 DIAGNOSIS — N2581 Secondary hyperparathyroidism of renal origin: Secondary | ICD-10-CM | POA: Diagnosis not present

## 2019-04-01 DIAGNOSIS — I10 Essential (primary) hypertension: Secondary | ICD-10-CM | POA: Diagnosis not present

## 2019-04-01 DIAGNOSIS — T80219A Unspecified infection due to central venous catheter, initial encounter: Secondary | ICD-10-CM | POA: Diagnosis not present

## 2019-04-01 DIAGNOSIS — Z418 Encounter for other procedures for purposes other than remedying health state: Secondary | ICD-10-CM | POA: Diagnosis not present

## 2019-04-01 DIAGNOSIS — N2581 Secondary hyperparathyroidism of renal origin: Secondary | ICD-10-CM | POA: Diagnosis not present

## 2019-04-01 DIAGNOSIS — N186 End stage renal disease: Secondary | ICD-10-CM | POA: Diagnosis not present

## 2019-04-01 DIAGNOSIS — D631 Anemia in chronic kidney disease: Secondary | ICD-10-CM | POA: Diagnosis not present

## 2019-04-02 DIAGNOSIS — E1122 Type 2 diabetes mellitus with diabetic chronic kidney disease: Secondary | ICD-10-CM | POA: Diagnosis not present

## 2019-04-02 DIAGNOSIS — I69354 Hemiplegia and hemiparesis following cerebral infarction affecting left non-dominant side: Secondary | ICD-10-CM | POA: Diagnosis not present

## 2019-04-02 DIAGNOSIS — N186 End stage renal disease: Secondary | ICD-10-CM | POA: Diagnosis not present

## 2019-04-03 DIAGNOSIS — G8929 Other chronic pain: Secondary | ICD-10-CM | POA: Diagnosis present

## 2019-04-03 DIAGNOSIS — R1310 Dysphagia, unspecified: Secondary | ICD-10-CM | POA: Diagnosis not present

## 2019-04-03 DIAGNOSIS — F339 Major depressive disorder, recurrent, unspecified: Secondary | ICD-10-CM | POA: Diagnosis not present

## 2019-04-03 DIAGNOSIS — Z992 Dependence on renal dialysis: Secondary | ICD-10-CM | POA: Diagnosis not present

## 2019-04-03 DIAGNOSIS — E876 Hypokalemia: Secondary | ICD-10-CM | POA: Diagnosis present

## 2019-04-03 DIAGNOSIS — M109 Gout, unspecified: Secondary | ICD-10-CM | POA: Diagnosis present

## 2019-04-03 DIAGNOSIS — E860 Dehydration: Secondary | ICD-10-CM | POA: Diagnosis present

## 2019-04-03 DIAGNOSIS — I10 Essential (primary) hypertension: Secondary | ICD-10-CM | POA: Diagnosis not present

## 2019-04-03 DIAGNOSIS — R9431 Abnormal electrocardiogram [ECG] [EKG]: Secondary | ICD-10-CM | POA: Diagnosis not present

## 2019-04-03 DIAGNOSIS — Z4901 Encounter for fitting and adjustment of extracorporeal dialysis catheter: Secondary | ICD-10-CM | POA: Diagnosis not present

## 2019-04-03 DIAGNOSIS — B965 Pseudomonas (aeruginosa) (mallei) (pseudomallei) as the cause of diseases classified elsewhere: Secondary | ICD-10-CM | POA: Diagnosis present

## 2019-04-03 DIAGNOSIS — D638 Anemia in other chronic diseases classified elsewhere: Secondary | ICD-10-CM | POA: Diagnosis present

## 2019-04-03 DIAGNOSIS — E1122 Type 2 diabetes mellitus with diabetic chronic kidney disease: Secondary | ICD-10-CM | POA: Diagnosis not present

## 2019-04-03 DIAGNOSIS — Z89612 Acquired absence of left leg above knee: Secondary | ICD-10-CM | POA: Diagnosis not present

## 2019-04-03 DIAGNOSIS — R4182 Altered mental status, unspecified: Secondary | ICD-10-CM | POA: Diagnosis not present

## 2019-04-03 DIAGNOSIS — Z6841 Body Mass Index (BMI) 40.0 and over, adult: Secondary | ICD-10-CM | POA: Diagnosis not present

## 2019-04-03 DIAGNOSIS — I080 Rheumatic disorders of both mitral and aortic valves: Secondary | ICD-10-CM | POA: Diagnosis not present

## 2019-04-03 DIAGNOSIS — E1169 Type 2 diabetes mellitus with other specified complication: Secondary | ICD-10-CM | POA: Diagnosis not present

## 2019-04-03 DIAGNOSIS — Z23 Encounter for immunization: Secondary | ICD-10-CM | POA: Diagnosis not present

## 2019-04-03 DIAGNOSIS — A4181 Sepsis due to Enterococcus: Secondary | ICD-10-CM | POA: Diagnosis not present

## 2019-04-03 DIAGNOSIS — M2550 Pain in unspecified joint: Secondary | ICD-10-CM | POA: Diagnosis not present

## 2019-04-03 DIAGNOSIS — I639 Cerebral infarction, unspecified: Secondary | ICD-10-CM | POA: Diagnosis not present

## 2019-04-03 DIAGNOSIS — I132 Hypertensive heart and chronic kidney disease with heart failure and with stage 5 chronic kidney disease, or end stage renal disease: Secondary | ICD-10-CM | POA: Diagnosis present

## 2019-04-03 DIAGNOSIS — A4189 Other specified sepsis: Secondary | ICD-10-CM | POA: Diagnosis not present

## 2019-04-03 DIAGNOSIS — I739 Peripheral vascular disease, unspecified: Secondary | ICD-10-CM | POA: Diagnosis not present

## 2019-04-03 DIAGNOSIS — R1311 Dysphagia, oral phase: Secondary | ICD-10-CM | POA: Diagnosis not present

## 2019-04-03 DIAGNOSIS — I428 Other cardiomyopathies: Secondary | ICD-10-CM | POA: Diagnosis not present

## 2019-04-03 DIAGNOSIS — Z418 Encounter for other procedures for purposes other than remedying health state: Secondary | ICD-10-CM | POA: Diagnosis not present

## 2019-04-03 DIAGNOSIS — K219 Gastro-esophageal reflux disease without esophagitis: Secondary | ICD-10-CM | POA: Diagnosis present

## 2019-04-03 DIAGNOSIS — R6520 Severe sepsis without septic shock: Secondary | ICD-10-CM | POA: Diagnosis not present

## 2019-04-03 DIAGNOSIS — A419 Sepsis, unspecified organism: Secondary | ICD-10-CM | POA: Diagnosis not present

## 2019-04-03 DIAGNOSIS — R404 Transient alteration of awareness: Secondary | ICD-10-CM | POA: Diagnosis not present

## 2019-04-03 DIAGNOSIS — H16323 Diffuse interstitial keratitis, bilateral: Secondary | ICD-10-CM | POA: Diagnosis not present

## 2019-04-03 DIAGNOSIS — R1312 Dysphagia, oropharyngeal phase: Secondary | ICD-10-CM | POA: Diagnosis not present

## 2019-04-03 DIAGNOSIS — E559 Vitamin D deficiency, unspecified: Secondary | ICD-10-CM | POA: Diagnosis not present

## 2019-04-03 DIAGNOSIS — K5900 Constipation, unspecified: Secondary | ICD-10-CM | POA: Diagnosis not present

## 2019-04-03 DIAGNOSIS — A4150 Gram-negative sepsis, unspecified: Secondary | ICD-10-CM | POA: Diagnosis not present

## 2019-04-03 DIAGNOSIS — E785 Hyperlipidemia, unspecified: Secondary | ICD-10-CM | POA: Diagnosis not present

## 2019-04-03 DIAGNOSIS — T80211A Bloodstream infection due to central venous catheter, initial encounter: Secondary | ICD-10-CM | POA: Diagnosis not present

## 2019-04-03 DIAGNOSIS — R531 Weakness: Secondary | ICD-10-CM | POA: Diagnosis not present

## 2019-04-03 DIAGNOSIS — H548 Legal blindness, as defined in USA: Secondary | ICD-10-CM | POA: Diagnosis not present

## 2019-04-03 DIAGNOSIS — I69954 Hemiplegia and hemiparesis following unspecified cerebrovascular disease affecting left non-dominant side: Secondary | ICD-10-CM | POA: Diagnosis not present

## 2019-04-03 DIAGNOSIS — I482 Chronic atrial fibrillation, unspecified: Secondary | ICD-10-CM | POA: Diagnosis not present

## 2019-04-03 DIAGNOSIS — J449 Chronic obstructive pulmonary disease, unspecified: Secondary | ICD-10-CM | POA: Diagnosis present

## 2019-04-03 DIAGNOSIS — Z89511 Acquired absence of right leg below knee: Secondary | ICD-10-CM | POA: Diagnosis not present

## 2019-04-03 DIAGNOSIS — Z1621 Resistance to vancomycin: Secondary | ICD-10-CM | POA: Diagnosis present

## 2019-04-03 DIAGNOSIS — I444 Left anterior fascicular block: Secondary | ICD-10-CM | POA: Diagnosis not present

## 2019-04-03 DIAGNOSIS — R569 Unspecified convulsions: Secondary | ICD-10-CM | POA: Diagnosis not present

## 2019-04-03 DIAGNOSIS — M24542 Contracture, left hand: Secondary | ICD-10-CM | POA: Diagnosis not present

## 2019-04-03 DIAGNOSIS — I5022 Chronic systolic (congestive) heart failure: Secondary | ICD-10-CM | POA: Diagnosis present

## 2019-04-03 DIAGNOSIS — M24522 Contracture, left elbow: Secondary | ICD-10-CM | POA: Diagnosis not present

## 2019-04-03 DIAGNOSIS — T827XXS Infection and inflammatory reaction due to other cardiac and vascular devices, implants and grafts, sequela: Secondary | ICD-10-CM | POA: Diagnosis not present

## 2019-04-03 DIAGNOSIS — Z7401 Bed confinement status: Secondary | ICD-10-CM | POA: Diagnosis not present

## 2019-04-03 DIAGNOSIS — N2581 Secondary hyperparathyroidism of renal origin: Secondary | ICD-10-CM | POA: Diagnosis not present

## 2019-04-03 DIAGNOSIS — R7881 Bacteremia: Secondary | ICD-10-CM | POA: Diagnosis not present

## 2019-04-03 DIAGNOSIS — M545 Low back pain: Secondary | ICD-10-CM | POA: Diagnosis not present

## 2019-04-03 DIAGNOSIS — A4152 Sepsis due to Pseudomonas: Secondary | ICD-10-CM | POA: Diagnosis not present

## 2019-04-03 DIAGNOSIS — T80219A Unspecified infection due to central venous catheter, initial encounter: Secondary | ICD-10-CM | POA: Diagnosis not present

## 2019-04-03 DIAGNOSIS — N186 End stage renal disease: Secondary | ICD-10-CM | POA: Diagnosis present

## 2019-04-03 DIAGNOSIS — R000 Tachycardia, unspecified: Secondary | ICD-10-CM | POA: Diagnosis not present

## 2019-04-03 DIAGNOSIS — I959 Hypotension, unspecified: Secondary | ICD-10-CM | POA: Diagnosis not present

## 2019-04-03 DIAGNOSIS — Z20828 Contact with and (suspected) exposure to other viral communicable diseases: Secondary | ICD-10-CM | POA: Diagnosis not present

## 2019-04-03 DIAGNOSIS — L89222 Pressure ulcer of left hip, stage 2: Secondary | ICD-10-CM | POA: Diagnosis present

## 2019-04-03 DIAGNOSIS — G40909 Epilepsy, unspecified, not intractable, without status epilepticus: Secondary | ICD-10-CM | POA: Diagnosis present

## 2019-04-03 DIAGNOSIS — T8241XA Breakdown (mechanical) of vascular dialysis catheter, initial encounter: Secondary | ICD-10-CM | POA: Diagnosis not present

## 2019-04-03 DIAGNOSIS — D649 Anemia, unspecified: Secondary | ICD-10-CM | POA: Diagnosis not present

## 2019-04-03 DIAGNOSIS — K8020 Calculus of gallbladder without cholecystitis without obstruction: Secondary | ICD-10-CM | POA: Diagnosis not present

## 2019-04-03 DIAGNOSIS — E213 Hyperparathyroidism, unspecified: Secondary | ICD-10-CM | POA: Diagnosis present

## 2019-04-03 DIAGNOSIS — D631 Anemia in chronic kidney disease: Secondary | ICD-10-CM | POA: Diagnosis not present

## 2019-04-03 DIAGNOSIS — T827XXA Infection and inflammatory reaction due to other cardiac and vascular devices, implants and grafts, initial encounter: Secondary | ICD-10-CM | POA: Diagnosis not present

## 2019-04-03 DIAGNOSIS — L89212 Pressure ulcer of right hip, stage 2: Secondary | ICD-10-CM | POA: Diagnosis present

## 2019-04-03 DIAGNOSIS — B952 Enterococcus as the cause of diseases classified elsewhere: Secondary | ICD-10-CM | POA: Diagnosis present

## 2019-04-12 DIAGNOSIS — N186 End stage renal disease: Secondary | ICD-10-CM | POA: Diagnosis not present

## 2019-04-12 DIAGNOSIS — I10 Essential (primary) hypertension: Secondary | ICD-10-CM | POA: Diagnosis not present

## 2019-04-12 DIAGNOSIS — D631 Anemia in chronic kidney disease: Secondary | ICD-10-CM | POA: Diagnosis not present

## 2019-04-16 DIAGNOSIS — T80211A Bloodstream infection due to central venous catheter, initial encounter: Secondary | ICD-10-CM | POA: Diagnosis not present

## 2019-04-16 DIAGNOSIS — Z8673 Personal history of transient ischemic attack (TIA), and cerebral infarction without residual deficits: Secondary | ICD-10-CM | POA: Diagnosis not present

## 2019-04-16 DIAGNOSIS — R569 Unspecified convulsions: Secondary | ICD-10-CM | POA: Diagnosis not present

## 2019-04-16 DIAGNOSIS — Z23 Encounter for immunization: Secondary | ICD-10-CM | POA: Diagnosis not present

## 2019-04-16 DIAGNOSIS — I482 Chronic atrial fibrillation, unspecified: Secondary | ICD-10-CM | POA: Diagnosis not present

## 2019-04-16 DIAGNOSIS — Z91040 Latex allergy status: Secondary | ICD-10-CM | POA: Diagnosis not present

## 2019-04-16 DIAGNOSIS — Z992 Dependence on renal dialysis: Secondary | ICD-10-CM | POA: Diagnosis not present

## 2019-04-16 DIAGNOSIS — M24522 Contracture, left elbow: Secondary | ICD-10-CM | POA: Diagnosis not present

## 2019-04-16 DIAGNOSIS — E1122 Type 2 diabetes mellitus with diabetic chronic kidney disease: Secondary | ICD-10-CM | POA: Diagnosis not present

## 2019-04-16 DIAGNOSIS — M545 Low back pain: Secondary | ICD-10-CM | POA: Diagnosis not present

## 2019-04-16 DIAGNOSIS — E785 Hyperlipidemia, unspecified: Secondary | ICD-10-CM | POA: Diagnosis not present

## 2019-04-16 DIAGNOSIS — Z9981 Dependence on supplemental oxygen: Secondary | ICD-10-CM | POA: Diagnosis not present

## 2019-04-16 DIAGNOSIS — E213 Hyperparathyroidism, unspecified: Secondary | ICD-10-CM | POA: Diagnosis present

## 2019-04-16 DIAGNOSIS — F339 Major depressive disorder, recurrent, unspecified: Secondary | ICD-10-CM | POA: Diagnosis not present

## 2019-04-16 DIAGNOSIS — K219 Gastro-esophageal reflux disease without esophagitis: Secondary | ICD-10-CM | POA: Diagnosis present

## 2019-04-16 DIAGNOSIS — H548 Legal blindness, as defined in USA: Secondary | ICD-10-CM | POA: Diagnosis not present

## 2019-04-16 DIAGNOSIS — M150 Primary generalized (osteo)arthritis: Secondary | ICD-10-CM | POA: Diagnosis not present

## 2019-04-16 DIAGNOSIS — D631 Anemia in chronic kidney disease: Secondary | ICD-10-CM | POA: Diagnosis not present

## 2019-04-16 DIAGNOSIS — R7881 Bacteremia: Secondary | ICD-10-CM | POA: Diagnosis not present

## 2019-04-16 DIAGNOSIS — J449 Chronic obstructive pulmonary disease, unspecified: Secondary | ICD-10-CM | POA: Diagnosis present

## 2019-04-16 DIAGNOSIS — I10 Essential (primary) hypertension: Secondary | ICD-10-CM | POA: Diagnosis not present

## 2019-04-16 DIAGNOSIS — G40909 Epilepsy, unspecified, not intractable, without status epilepticus: Secondary | ICD-10-CM | POA: Diagnosis not present

## 2019-04-16 DIAGNOSIS — I132 Hypertensive heart and chronic kidney disease with heart failure and with stage 5 chronic kidney disease, or end stage renal disease: Secondary | ICD-10-CM | POA: Diagnosis present

## 2019-04-16 DIAGNOSIS — R404 Transient alteration of awareness: Secondary | ICD-10-CM | POA: Diagnosis not present

## 2019-04-16 DIAGNOSIS — I5022 Chronic systolic (congestive) heart failure: Secondary | ICD-10-CM | POA: Diagnosis not present

## 2019-04-16 DIAGNOSIS — R52 Pain, unspecified: Secondary | ICD-10-CM | POA: Diagnosis not present

## 2019-04-16 DIAGNOSIS — M2550 Pain in unspecified joint: Secondary | ICD-10-CM | POA: Diagnosis not present

## 2019-04-16 DIAGNOSIS — Z79899 Other long term (current) drug therapy: Secondary | ICD-10-CM | POA: Diagnosis not present

## 2019-04-16 DIAGNOSIS — Z418 Encounter for other procedures for purposes other than remedying health state: Secondary | ICD-10-CM | POA: Diagnosis not present

## 2019-04-16 DIAGNOSIS — Z89611 Acquired absence of right leg above knee: Secondary | ICD-10-CM | POA: Diagnosis not present

## 2019-04-16 DIAGNOSIS — Z20828 Contact with and (suspected) exposure to other viral communicable diseases: Secondary | ICD-10-CM | POA: Diagnosis present

## 2019-04-16 DIAGNOSIS — Z7401 Bed confinement status: Secondary | ICD-10-CM | POA: Diagnosis not present

## 2019-04-16 DIAGNOSIS — N2581 Secondary hyperparathyroidism of renal origin: Secondary | ICD-10-CM | POA: Diagnosis not present

## 2019-04-16 DIAGNOSIS — E119 Type 2 diabetes mellitus without complications: Secondary | ICD-10-CM | POA: Diagnosis not present

## 2019-04-16 DIAGNOSIS — I2510 Atherosclerotic heart disease of native coronary artery without angina pectoris: Secondary | ICD-10-CM | POA: Diagnosis present

## 2019-04-16 DIAGNOSIS — Z4901 Encounter for fitting and adjustment of extracorporeal dialysis catheter: Secondary | ICD-10-CM | POA: Diagnosis not present

## 2019-04-16 DIAGNOSIS — I9589 Other hypotension: Secondary | ICD-10-CM | POA: Diagnosis present

## 2019-04-16 DIAGNOSIS — R000 Tachycardia, unspecified: Secondary | ICD-10-CM | POA: Diagnosis not present

## 2019-04-16 DIAGNOSIS — K5900 Constipation, unspecified: Secondary | ICD-10-CM | POA: Diagnosis not present

## 2019-04-16 DIAGNOSIS — N186 End stage renal disease: Secondary | ICD-10-CM | POA: Diagnosis not present

## 2019-04-16 DIAGNOSIS — R4182 Altered mental status, unspecified: Secondary | ICD-10-CM | POA: Diagnosis not present

## 2019-04-16 DIAGNOSIS — H547 Unspecified visual loss: Secondary | ICD-10-CM | POA: Diagnosis present

## 2019-04-16 DIAGNOSIS — G9349 Other encephalopathy: Secondary | ICD-10-CM | POA: Diagnosis present

## 2019-04-16 DIAGNOSIS — Z89511 Acquired absence of right leg below knee: Secondary | ICD-10-CM | POA: Diagnosis not present

## 2019-04-16 DIAGNOSIS — A4181 Sepsis due to Enterococcus: Secondary | ICD-10-CM | POA: Diagnosis not present

## 2019-04-16 DIAGNOSIS — I4891 Unspecified atrial fibrillation: Secondary | ICD-10-CM | POA: Diagnosis present

## 2019-04-16 DIAGNOSIS — A4102 Sepsis due to Methicillin resistant Staphylococcus aureus: Secondary | ICD-10-CM | POA: Diagnosis not present

## 2019-04-16 DIAGNOSIS — I69954 Hemiplegia and hemiparesis following unspecified cerebrovascular disease affecting left non-dominant side: Secondary | ICD-10-CM | POA: Diagnosis not present

## 2019-04-16 DIAGNOSIS — I428 Other cardiomyopathies: Secondary | ICD-10-CM | POA: Diagnosis not present

## 2019-04-16 DIAGNOSIS — H16323 Diffuse interstitial keratitis, bilateral: Secondary | ICD-10-CM | POA: Diagnosis not present

## 2019-04-16 DIAGNOSIS — R531 Weakness: Secondary | ICD-10-CM | POA: Diagnosis not present

## 2019-04-16 DIAGNOSIS — I120 Hypertensive chronic kidney disease with stage 5 chronic kidney disease or end stage renal disease: Secondary | ICD-10-CM | POA: Diagnosis not present

## 2019-04-16 DIAGNOSIS — Z89612 Acquired absence of left leg above knee: Secondary | ICD-10-CM | POA: Diagnosis not present

## 2019-04-16 DIAGNOSIS — D72829 Elevated white blood cell count, unspecified: Secondary | ICD-10-CM | POA: Diagnosis not present

## 2019-04-16 DIAGNOSIS — M24542 Contracture, left hand: Secondary | ICD-10-CM | POA: Diagnosis not present

## 2019-04-16 DIAGNOSIS — R1312 Dysphagia, oropharyngeal phase: Secondary | ICD-10-CM | POA: Diagnosis not present

## 2019-04-16 DIAGNOSIS — I69351 Hemiplegia and hemiparesis following cerebral infarction affecting right dominant side: Secondary | ICD-10-CM | POA: Diagnosis not present

## 2019-04-16 DIAGNOSIS — I959 Hypotension, unspecified: Secondary | ICD-10-CM | POA: Diagnosis not present

## 2019-04-16 DIAGNOSIS — F0390 Unspecified dementia without behavioral disturbance: Secondary | ICD-10-CM | POA: Diagnosis present

## 2019-04-16 DIAGNOSIS — R1311 Dysphagia, oral phase: Secondary | ICD-10-CM | POA: Diagnosis not present

## 2019-04-16 DIAGNOSIS — G8929 Other chronic pain: Secondary | ICD-10-CM | POA: Diagnosis present

## 2019-04-16 DIAGNOSIS — F419 Anxiety disorder, unspecified: Secondary | ICD-10-CM | POA: Diagnosis present

## 2019-04-16 DIAGNOSIS — A4152 Sepsis due to Pseudomonas: Secondary | ICD-10-CM | POA: Diagnosis not present

## 2019-04-16 DIAGNOSIS — A419 Sepsis, unspecified organism: Secondary | ICD-10-CM | POA: Diagnosis not present

## 2019-04-16 DIAGNOSIS — I739 Peripheral vascular disease, unspecified: Secondary | ICD-10-CM | POA: Diagnosis not present

## 2019-04-16 DIAGNOSIS — T827XXA Infection and inflammatory reaction due to other cardiac and vascular devices, implants and grafts, initial encounter: Secondary | ICD-10-CM | POA: Diagnosis not present

## 2019-04-16 DIAGNOSIS — E559 Vitamin D deficiency, unspecified: Secondary | ICD-10-CM | POA: Diagnosis not present

## 2019-04-16 DIAGNOSIS — I639 Cerebral infarction, unspecified: Secondary | ICD-10-CM | POA: Diagnosis not present

## 2019-04-16 DIAGNOSIS — R5383 Other fatigue: Secondary | ICD-10-CM | POA: Diagnosis not present

## 2019-04-16 DIAGNOSIS — Z6841 Body Mass Index (BMI) 40.0 and over, adult: Secondary | ICD-10-CM | POA: Diagnosis not present

## 2019-04-16 DIAGNOSIS — T827XXS Infection and inflammatory reaction due to other cardiac and vascular devices, implants and grafts, sequela: Secondary | ICD-10-CM | POA: Diagnosis not present

## 2019-04-16 DIAGNOSIS — N185 Chronic kidney disease, stage 5: Secondary | ICD-10-CM | POA: Diagnosis not present

## 2019-04-16 DIAGNOSIS — T80219A Unspecified infection due to central venous catheter, initial encounter: Secondary | ICD-10-CM | POA: Diagnosis not present

## 2019-04-16 DIAGNOSIS — I444 Left anterior fascicular block: Secondary | ICD-10-CM | POA: Diagnosis present

## 2019-04-17 DIAGNOSIS — Z418 Encounter for other procedures for purposes other than remedying health state: Secondary | ICD-10-CM | POA: Diagnosis not present

## 2019-04-17 DIAGNOSIS — N2581 Secondary hyperparathyroidism of renal origin: Secondary | ICD-10-CM | POA: Diagnosis not present

## 2019-04-17 DIAGNOSIS — N186 End stage renal disease: Secondary | ICD-10-CM | POA: Diagnosis not present

## 2019-04-17 DIAGNOSIS — D631 Anemia in chronic kidney disease: Secondary | ICD-10-CM | POA: Diagnosis not present

## 2019-04-17 DIAGNOSIS — A4102 Sepsis due to Methicillin resistant Staphylococcus aureus: Secondary | ICD-10-CM | POA: Diagnosis not present

## 2019-04-17 DIAGNOSIS — T80219A Unspecified infection due to central venous catheter, initial encounter: Secondary | ICD-10-CM | POA: Diagnosis not present

## 2019-04-17 DIAGNOSIS — I69351 Hemiplegia and hemiparesis following cerebral infarction affecting right dominant side: Secondary | ICD-10-CM | POA: Diagnosis not present

## 2019-04-17 DIAGNOSIS — E119 Type 2 diabetes mellitus without complications: Secondary | ICD-10-CM | POA: Diagnosis not present

## 2019-04-17 DIAGNOSIS — A419 Sepsis, unspecified organism: Secondary | ICD-10-CM | POA: Diagnosis not present

## 2019-04-20 DIAGNOSIS — Z418 Encounter for other procedures for purposes other than remedying health state: Secondary | ICD-10-CM | POA: Diagnosis not present

## 2019-04-20 DIAGNOSIS — A419 Sepsis, unspecified organism: Secondary | ICD-10-CM | POA: Diagnosis not present

## 2019-04-20 DIAGNOSIS — N2581 Secondary hyperparathyroidism of renal origin: Secondary | ICD-10-CM | POA: Diagnosis not present

## 2019-04-20 DIAGNOSIS — T80219A Unspecified infection due to central venous catheter, initial encounter: Secondary | ICD-10-CM | POA: Diagnosis not present

## 2019-04-20 DIAGNOSIS — D631 Anemia in chronic kidney disease: Secondary | ICD-10-CM | POA: Diagnosis not present

## 2019-04-20 DIAGNOSIS — N186 End stage renal disease: Secondary | ICD-10-CM | POA: Diagnosis not present

## 2019-04-22 DIAGNOSIS — D631 Anemia in chronic kidney disease: Secondary | ICD-10-CM | POA: Diagnosis not present

## 2019-04-22 DIAGNOSIS — Z418 Encounter for other procedures for purposes other than remedying health state: Secondary | ICD-10-CM | POA: Diagnosis not present

## 2019-04-22 DIAGNOSIS — A419 Sepsis, unspecified organism: Secondary | ICD-10-CM | POA: Diagnosis not present

## 2019-04-22 DIAGNOSIS — N2581 Secondary hyperparathyroidism of renal origin: Secondary | ICD-10-CM | POA: Diagnosis not present

## 2019-04-22 DIAGNOSIS — N186 End stage renal disease: Secondary | ICD-10-CM | POA: Diagnosis not present

## 2019-04-22 DIAGNOSIS — T80219A Unspecified infection due to central venous catheter, initial encounter: Secondary | ICD-10-CM | POA: Diagnosis not present

## 2019-04-23 DIAGNOSIS — I5022 Chronic systolic (congestive) heart failure: Secondary | ICD-10-CM | POA: Diagnosis not present

## 2019-04-23 DIAGNOSIS — I10 Essential (primary) hypertension: Secondary | ICD-10-CM | POA: Diagnosis not present

## 2019-04-23 DIAGNOSIS — E1122 Type 2 diabetes mellitus with diabetic chronic kidney disease: Secondary | ICD-10-CM | POA: Diagnosis not present

## 2019-04-23 DIAGNOSIS — M150 Primary generalized (osteo)arthritis: Secondary | ICD-10-CM | POA: Diagnosis not present

## 2019-04-24 DIAGNOSIS — Z418 Encounter for other procedures for purposes other than remedying health state: Secondary | ICD-10-CM | POA: Diagnosis not present

## 2019-04-24 DIAGNOSIS — T80219A Unspecified infection due to central venous catheter, initial encounter: Secondary | ICD-10-CM | POA: Diagnosis not present

## 2019-04-24 DIAGNOSIS — D631 Anemia in chronic kidney disease: Secondary | ICD-10-CM | POA: Diagnosis not present

## 2019-04-24 DIAGNOSIS — A419 Sepsis, unspecified organism: Secondary | ICD-10-CM | POA: Diagnosis not present

## 2019-04-24 DIAGNOSIS — N186 End stage renal disease: Secondary | ICD-10-CM | POA: Diagnosis not present

## 2019-04-24 DIAGNOSIS — N2581 Secondary hyperparathyroidism of renal origin: Secondary | ICD-10-CM | POA: Diagnosis not present

## 2019-04-27 DIAGNOSIS — Z418 Encounter for other procedures for purposes other than remedying health state: Secondary | ICD-10-CM | POA: Diagnosis not present

## 2019-04-27 DIAGNOSIS — D631 Anemia in chronic kidney disease: Secondary | ICD-10-CM | POA: Diagnosis not present

## 2019-04-27 DIAGNOSIS — N2581 Secondary hyperparathyroidism of renal origin: Secondary | ICD-10-CM | POA: Diagnosis not present

## 2019-04-27 DIAGNOSIS — N186 End stage renal disease: Secondary | ICD-10-CM | POA: Diagnosis not present

## 2019-04-27 DIAGNOSIS — T80219A Unspecified infection due to central venous catheter, initial encounter: Secondary | ICD-10-CM | POA: Diagnosis not present

## 2019-04-27 DIAGNOSIS — A419 Sepsis, unspecified organism: Secondary | ICD-10-CM | POA: Diagnosis not present

## 2019-04-29 DIAGNOSIS — N186 End stage renal disease: Secondary | ICD-10-CM | POA: Diagnosis not present

## 2019-04-29 DIAGNOSIS — Z418 Encounter for other procedures for purposes other than remedying health state: Secondary | ICD-10-CM | POA: Diagnosis not present

## 2019-04-29 DIAGNOSIS — A419 Sepsis, unspecified organism: Secondary | ICD-10-CM | POA: Diagnosis not present

## 2019-04-29 DIAGNOSIS — T80219A Unspecified infection due to central venous catheter, initial encounter: Secondary | ICD-10-CM | POA: Diagnosis not present

## 2019-04-29 DIAGNOSIS — D631 Anemia in chronic kidney disease: Secondary | ICD-10-CM | POA: Diagnosis not present

## 2019-04-29 DIAGNOSIS — N2581 Secondary hyperparathyroidism of renal origin: Secondary | ICD-10-CM | POA: Diagnosis not present

## 2019-04-30 DIAGNOSIS — A4102 Sepsis due to Methicillin resistant Staphylococcus aureus: Secondary | ICD-10-CM | POA: Diagnosis not present

## 2019-04-30 DIAGNOSIS — G40909 Epilepsy, unspecified, not intractable, without status epilepticus: Secondary | ICD-10-CM | POA: Diagnosis not present

## 2019-04-30 DIAGNOSIS — T827XXS Infection and inflammatory reaction due to other cardiac and vascular devices, implants and grafts, sequela: Secondary | ICD-10-CM | POA: Diagnosis not present

## 2019-04-30 DIAGNOSIS — I5022 Chronic systolic (congestive) heart failure: Secondary | ICD-10-CM | POA: Diagnosis not present

## 2019-05-01 DIAGNOSIS — T80219A Unspecified infection due to central venous catheter, initial encounter: Secondary | ICD-10-CM | POA: Diagnosis not present

## 2019-05-01 DIAGNOSIS — N2581 Secondary hyperparathyroidism of renal origin: Secondary | ICD-10-CM | POA: Diagnosis not present

## 2019-05-01 DIAGNOSIS — Z418 Encounter for other procedures for purposes other than remedying health state: Secondary | ICD-10-CM | POA: Diagnosis not present

## 2019-05-01 DIAGNOSIS — A419 Sepsis, unspecified organism: Secondary | ICD-10-CM | POA: Diagnosis not present

## 2019-05-01 DIAGNOSIS — D631 Anemia in chronic kidney disease: Secondary | ICD-10-CM | POA: Diagnosis not present

## 2019-05-01 DIAGNOSIS — N186 End stage renal disease: Secondary | ICD-10-CM | POA: Diagnosis not present

## 2019-05-03 DIAGNOSIS — D631 Anemia in chronic kidney disease: Secondary | ICD-10-CM | POA: Diagnosis not present

## 2019-05-03 DIAGNOSIS — N2581 Secondary hyperparathyroidism of renal origin: Secondary | ICD-10-CM | POA: Diagnosis not present

## 2019-05-03 DIAGNOSIS — Z418 Encounter for other procedures for purposes other than remedying health state: Secondary | ICD-10-CM | POA: Diagnosis not present

## 2019-05-03 DIAGNOSIS — T80219A Unspecified infection due to central venous catheter, initial encounter: Secondary | ICD-10-CM | POA: Diagnosis not present

## 2019-05-03 DIAGNOSIS — N186 End stage renal disease: Secondary | ICD-10-CM | POA: Diagnosis not present

## 2019-05-03 DIAGNOSIS — A419 Sepsis, unspecified organism: Secondary | ICD-10-CM | POA: Diagnosis not present

## 2019-05-05 DIAGNOSIS — F419 Anxiety disorder, unspecified: Secondary | ICD-10-CM | POA: Diagnosis present

## 2019-05-05 DIAGNOSIS — G8929 Other chronic pain: Secondary | ICD-10-CM | POA: Diagnosis present

## 2019-05-05 DIAGNOSIS — F0390 Unspecified dementia without behavioral disturbance: Secondary | ICD-10-CM | POA: Diagnosis present

## 2019-05-05 DIAGNOSIS — M24542 Contracture, left hand: Secondary | ICD-10-CM | POA: Diagnosis not present

## 2019-05-05 DIAGNOSIS — Z89511 Acquired absence of right leg below knee: Secondary | ICD-10-CM | POA: Diagnosis not present

## 2019-05-05 DIAGNOSIS — N2581 Secondary hyperparathyroidism of renal origin: Secondary | ICD-10-CM | POA: Diagnosis not present

## 2019-05-05 DIAGNOSIS — N185 Chronic kidney disease, stage 5: Secondary | ICD-10-CM | POA: Diagnosis not present

## 2019-05-05 DIAGNOSIS — Z418 Encounter for other procedures for purposes other than remedying health state: Secondary | ICD-10-CM | POA: Diagnosis not present

## 2019-05-05 DIAGNOSIS — I5022 Chronic systolic (congestive) heart failure: Secondary | ICD-10-CM | POA: Diagnosis not present

## 2019-05-05 DIAGNOSIS — R5383 Other fatigue: Secondary | ICD-10-CM | POA: Diagnosis not present

## 2019-05-05 DIAGNOSIS — D631 Anemia in chronic kidney disease: Secondary | ICD-10-CM | POA: Diagnosis present

## 2019-05-05 DIAGNOSIS — J449 Chronic obstructive pulmonary disease, unspecified: Secondary | ICD-10-CM | POA: Diagnosis present

## 2019-05-05 DIAGNOSIS — R000 Tachycardia, unspecified: Secondary | ICD-10-CM | POA: Diagnosis not present

## 2019-05-05 DIAGNOSIS — G40909 Epilepsy, unspecified, not intractable, without status epilepticus: Secondary | ICD-10-CM | POA: Diagnosis not present

## 2019-05-05 DIAGNOSIS — I959 Hypotension, unspecified: Secondary | ICD-10-CM | POA: Diagnosis not present

## 2019-05-05 DIAGNOSIS — G3189 Other specified degenerative diseases of nervous system: Secondary | ICD-10-CM | POA: Diagnosis not present

## 2019-05-05 DIAGNOSIS — R4182 Altered mental status, unspecified: Secondary | ICD-10-CM | POA: Diagnosis not present

## 2019-05-05 DIAGNOSIS — H547 Unspecified visual loss: Secondary | ICD-10-CM | POA: Diagnosis present

## 2019-05-05 DIAGNOSIS — I672 Cerebral atherosclerosis: Secondary | ICD-10-CM | POA: Diagnosis not present

## 2019-05-05 DIAGNOSIS — I444 Left anterior fascicular block: Secondary | ICD-10-CM | POA: Diagnosis present

## 2019-05-05 DIAGNOSIS — Z89612 Acquired absence of left leg above knee: Secondary | ICD-10-CM | POA: Diagnosis not present

## 2019-05-05 DIAGNOSIS — I739 Peripheral vascular disease, unspecified: Secondary | ICD-10-CM | POA: Diagnosis not present

## 2019-05-05 DIAGNOSIS — T80219A Unspecified infection due to central venous catheter, initial encounter: Secondary | ICD-10-CM | POA: Diagnosis not present

## 2019-05-05 DIAGNOSIS — Z91040 Latex allergy status: Secondary | ICD-10-CM | POA: Diagnosis not present

## 2019-05-05 DIAGNOSIS — Z89611 Acquired absence of right leg above knee: Secondary | ICD-10-CM | POA: Diagnosis not present

## 2019-05-05 DIAGNOSIS — I9589 Other hypotension: Secondary | ICD-10-CM | POA: Diagnosis present

## 2019-05-05 DIAGNOSIS — R52 Pain, unspecified: Secondary | ICD-10-CM | POA: Diagnosis not present

## 2019-05-05 DIAGNOSIS — E1122 Type 2 diabetes mellitus with diabetic chronic kidney disease: Secondary | ICD-10-CM | POA: Diagnosis present

## 2019-05-05 DIAGNOSIS — Z9981 Dependence on supplemental oxygen: Secondary | ICD-10-CM | POA: Diagnosis not present

## 2019-05-05 DIAGNOSIS — G9349 Other encephalopathy: Secondary | ICD-10-CM | POA: Diagnosis present

## 2019-05-05 DIAGNOSIS — M24522 Contracture, left elbow: Secondary | ICD-10-CM | POA: Diagnosis not present

## 2019-05-05 DIAGNOSIS — R404 Transient alteration of awareness: Secondary | ICD-10-CM | POA: Diagnosis not present

## 2019-05-05 DIAGNOSIS — D72829 Elevated white blood cell count, unspecified: Secondary | ICD-10-CM | POA: Diagnosis not present

## 2019-05-05 DIAGNOSIS — I4891 Unspecified atrial fibrillation: Secondary | ICD-10-CM | POA: Diagnosis not present

## 2019-05-05 DIAGNOSIS — I10 Essential (primary) hypertension: Secondary | ICD-10-CM | POA: Diagnosis not present

## 2019-05-05 DIAGNOSIS — M545 Low back pain: Secondary | ICD-10-CM | POA: Diagnosis not present

## 2019-05-05 DIAGNOSIS — Z23 Encounter for immunization: Secondary | ICD-10-CM | POA: Diagnosis not present

## 2019-05-05 DIAGNOSIS — K219 Gastro-esophageal reflux disease without esophagitis: Secondary | ICD-10-CM | POA: Diagnosis present

## 2019-05-05 DIAGNOSIS — I6381 Other cerebral infarction due to occlusion or stenosis of small artery: Secondary | ICD-10-CM | POA: Diagnosis not present

## 2019-05-05 DIAGNOSIS — E213 Hyperparathyroidism, unspecified: Secondary | ICD-10-CM | POA: Diagnosis present

## 2019-05-05 DIAGNOSIS — Z20828 Contact with and (suspected) exposure to other viral communicable diseases: Secondary | ICD-10-CM | POA: Diagnosis present

## 2019-05-05 DIAGNOSIS — K5900 Constipation, unspecified: Secondary | ICD-10-CM | POA: Diagnosis not present

## 2019-05-05 DIAGNOSIS — Z8673 Personal history of transient ischemic attack (TIA), and cerebral infarction without residual deficits: Secondary | ICD-10-CM | POA: Diagnosis not present

## 2019-05-05 DIAGNOSIS — N186 End stage renal disease: Secondary | ICD-10-CM | POA: Diagnosis present

## 2019-05-05 DIAGNOSIS — A419 Sepsis, unspecified organism: Secondary | ICD-10-CM | POA: Diagnosis not present

## 2019-05-05 DIAGNOSIS — R7881 Bacteremia: Secondary | ICD-10-CM | POA: Diagnosis not present

## 2019-05-05 DIAGNOSIS — G9340 Encephalopathy, unspecified: Secondary | ICD-10-CM | POA: Diagnosis not present

## 2019-05-05 DIAGNOSIS — F339 Major depressive disorder, recurrent, unspecified: Secondary | ICD-10-CM | POA: Diagnosis not present

## 2019-05-05 DIAGNOSIS — Z79899 Other long term (current) drug therapy: Secondary | ICD-10-CM | POA: Diagnosis not present

## 2019-05-05 DIAGNOSIS — I132 Hypertensive heart and chronic kidney disease with heart failure and with stage 5 chronic kidney disease, or end stage renal disease: Secondary | ICD-10-CM | POA: Diagnosis present

## 2019-05-05 DIAGNOSIS — D649 Anemia, unspecified: Secondary | ICD-10-CM | POA: Diagnosis not present

## 2019-05-05 DIAGNOSIS — R1311 Dysphagia, oral phase: Secondary | ICD-10-CM | POA: Diagnosis present

## 2019-05-05 DIAGNOSIS — I482 Chronic atrial fibrillation, unspecified: Secondary | ICD-10-CM | POA: Diagnosis not present

## 2019-05-05 DIAGNOSIS — I120 Hypertensive chronic kidney disease with stage 5 chronic kidney disease or end stage renal disease: Secondary | ICD-10-CM | POA: Diagnosis not present

## 2019-05-05 DIAGNOSIS — Z992 Dependence on renal dialysis: Secondary | ICD-10-CM | POA: Diagnosis not present

## 2019-05-05 DIAGNOSIS — R1312 Dysphagia, oropharyngeal phase: Secondary | ICD-10-CM | POA: Diagnosis not present

## 2019-05-05 DIAGNOSIS — E559 Vitamin D deficiency, unspecified: Secondary | ICD-10-CM | POA: Diagnosis not present

## 2019-05-05 DIAGNOSIS — I2510 Atherosclerotic heart disease of native coronary artery without angina pectoris: Secondary | ICD-10-CM | POA: Diagnosis present

## 2019-05-05 DIAGNOSIS — R6520 Severe sepsis without septic shock: Secondary | ICD-10-CM | POA: Diagnosis not present

## 2019-05-05 DIAGNOSIS — I6782 Cerebral ischemia: Secondary | ICD-10-CM | POA: Diagnosis not present

## 2019-05-05 DIAGNOSIS — I639 Cerebral infarction, unspecified: Secondary | ICD-10-CM | POA: Diagnosis not present

## 2019-05-05 DIAGNOSIS — I69954 Hemiplegia and hemiparesis following unspecified cerebrovascular disease affecting left non-dominant side: Secondary | ICD-10-CM | POA: Diagnosis not present

## 2019-05-05 DIAGNOSIS — H16323 Diffuse interstitial keratitis, bilateral: Secondary | ICD-10-CM | POA: Diagnosis not present

## 2019-05-05 DIAGNOSIS — H548 Legal blindness, as defined in USA: Secondary | ICD-10-CM | POA: Diagnosis not present

## 2019-05-05 DIAGNOSIS — E785 Hyperlipidemia, unspecified: Secondary | ICD-10-CM | POA: Diagnosis not present

## 2019-05-10 DIAGNOSIS — T80219A Unspecified infection due to central venous catheter, initial encounter: Secondary | ICD-10-CM | POA: Diagnosis not present

## 2019-05-10 DIAGNOSIS — D649 Anemia, unspecified: Secondary | ICD-10-CM | POA: Diagnosis not present

## 2019-05-10 DIAGNOSIS — A419 Sepsis, unspecified organism: Secondary | ICD-10-CM | POA: Diagnosis not present

## 2019-05-10 DIAGNOSIS — N2581 Secondary hyperparathyroidism of renal origin: Secondary | ICD-10-CM | POA: Diagnosis not present

## 2019-05-10 DIAGNOSIS — Z418 Encounter for other procedures for purposes other than remedying health state: Secondary | ICD-10-CM | POA: Diagnosis not present

## 2019-05-10 DIAGNOSIS — E559 Vitamin D deficiency, unspecified: Secondary | ICD-10-CM | POA: Diagnosis not present

## 2019-05-10 DIAGNOSIS — I482 Chronic atrial fibrillation, unspecified: Secondary | ICD-10-CM | POA: Diagnosis not present

## 2019-05-10 DIAGNOSIS — I120 Hypertensive chronic kidney disease with stage 5 chronic kidney disease or end stage renal disease: Secondary | ICD-10-CM | POA: Diagnosis not present

## 2019-05-10 DIAGNOSIS — M545 Low back pain: Secondary | ICD-10-CM | POA: Diagnosis not present

## 2019-05-10 DIAGNOSIS — R5383 Other fatigue: Secondary | ICD-10-CM | POA: Diagnosis not present

## 2019-05-10 DIAGNOSIS — E785 Hyperlipidemia, unspecified: Secondary | ICD-10-CM | POA: Diagnosis not present

## 2019-05-10 DIAGNOSIS — K5900 Constipation, unspecified: Secondary | ICD-10-CM | POA: Diagnosis not present

## 2019-05-10 DIAGNOSIS — G40909 Epilepsy, unspecified, not intractable, without status epilepticus: Secondary | ICD-10-CM | POA: Diagnosis not present

## 2019-05-10 DIAGNOSIS — E1122 Type 2 diabetes mellitus with diabetic chronic kidney disease: Secondary | ICD-10-CM | POA: Diagnosis not present

## 2019-05-10 DIAGNOSIS — I10 Essential (primary) hypertension: Secondary | ICD-10-CM | POA: Diagnosis not present

## 2019-05-10 DIAGNOSIS — I69954 Hemiplegia and hemiparesis following unspecified cerebrovascular disease affecting left non-dominant side: Secondary | ICD-10-CM | POA: Diagnosis not present

## 2019-05-10 DIAGNOSIS — I4891 Unspecified atrial fibrillation: Secondary | ICD-10-CM | POA: Diagnosis not present

## 2019-05-10 DIAGNOSIS — F339 Major depressive disorder, recurrent, unspecified: Secondary | ICD-10-CM | POA: Diagnosis not present

## 2019-05-10 DIAGNOSIS — H548 Legal blindness, as defined in USA: Secondary | ICD-10-CM | POA: Diagnosis not present

## 2019-05-10 DIAGNOSIS — M24542 Contracture, left hand: Secondary | ICD-10-CM | POA: Diagnosis not present

## 2019-05-10 DIAGNOSIS — R7881 Bacteremia: Secondary | ICD-10-CM | POA: Diagnosis not present

## 2019-05-10 DIAGNOSIS — M24522 Contracture, left elbow: Secondary | ICD-10-CM | POA: Diagnosis not present

## 2019-05-10 DIAGNOSIS — Z89612 Acquired absence of left leg above knee: Secondary | ICD-10-CM | POA: Diagnosis not present

## 2019-05-10 DIAGNOSIS — I639 Cerebral infarction, unspecified: Secondary | ICD-10-CM | POA: Diagnosis not present

## 2019-05-10 DIAGNOSIS — Z89511 Acquired absence of right leg below knee: Secondary | ICD-10-CM | POA: Diagnosis not present

## 2019-05-10 DIAGNOSIS — N186 End stage renal disease: Secondary | ICD-10-CM | POA: Diagnosis not present

## 2019-05-10 DIAGNOSIS — Z23 Encounter for immunization: Secondary | ICD-10-CM | POA: Diagnosis not present

## 2019-05-10 DIAGNOSIS — I5022 Chronic systolic (congestive) heart failure: Secondary | ICD-10-CM | POA: Diagnosis not present

## 2019-05-10 DIAGNOSIS — R1312 Dysphagia, oropharyngeal phase: Secondary | ICD-10-CM | POA: Diagnosis not present

## 2019-05-10 DIAGNOSIS — D631 Anemia in chronic kidney disease: Secondary | ICD-10-CM | POA: Diagnosis not present

## 2019-05-10 DIAGNOSIS — H16323 Diffuse interstitial keratitis, bilateral: Secondary | ICD-10-CM | POA: Diagnosis not present

## 2019-05-10 DIAGNOSIS — I739 Peripheral vascular disease, unspecified: Secondary | ICD-10-CM | POA: Diagnosis not present

## 2019-05-10 DIAGNOSIS — Z992 Dependence on renal dialysis: Secondary | ICD-10-CM | POA: Diagnosis not present

## 2019-05-11 DIAGNOSIS — N2581 Secondary hyperparathyroidism of renal origin: Secondary | ICD-10-CM | POA: Diagnosis not present

## 2019-05-11 DIAGNOSIS — T80219A Unspecified infection due to central venous catheter, initial encounter: Secondary | ICD-10-CM | POA: Diagnosis not present

## 2019-05-11 DIAGNOSIS — Z418 Encounter for other procedures for purposes other than remedying health state: Secondary | ICD-10-CM | POA: Diagnosis not present

## 2019-05-11 DIAGNOSIS — A419 Sepsis, unspecified organism: Secondary | ICD-10-CM | POA: Diagnosis not present

## 2019-05-11 DIAGNOSIS — D631 Anemia in chronic kidney disease: Secondary | ICD-10-CM | POA: Diagnosis not present

## 2019-05-11 DIAGNOSIS — N186 End stage renal disease: Secondary | ICD-10-CM | POA: Diagnosis not present

## 2019-05-21 DIAGNOSIS — A09 Infectious gastroenteritis and colitis, unspecified: Secondary | ICD-10-CM | POA: Diagnosis not present

## 2019-05-21 DIAGNOSIS — Z20828 Contact with and (suspected) exposure to other viral communicable diseases: Secondary | ICD-10-CM | POA: Diagnosis not present

## 2019-05-21 DIAGNOSIS — N390 Urinary tract infection, site not specified: Secondary | ICD-10-CM | POA: Diagnosis not present

## 2019-05-21 DIAGNOSIS — D6489 Other specified anemias: Secondary | ICD-10-CM | POA: Diagnosis present

## 2019-05-21 DIAGNOSIS — A419 Sepsis, unspecified organism: Secondary | ICD-10-CM | POA: Diagnosis not present

## 2019-05-21 DIAGNOSIS — R197 Diarrhea, unspecified: Secondary | ICD-10-CM | POA: Diagnosis not present

## 2019-05-21 DIAGNOSIS — K219 Gastro-esophageal reflux disease without esophagitis: Secondary | ICD-10-CM | POA: Diagnosis present

## 2019-05-21 DIAGNOSIS — Z9981 Dependence on supplemental oxygen: Secondary | ICD-10-CM | POA: Diagnosis not present

## 2019-05-21 DIAGNOSIS — R531 Weakness: Secondary | ICD-10-CM | POA: Diagnosis not present

## 2019-05-21 DIAGNOSIS — Z992 Dependence on renal dialysis: Secondary | ICD-10-CM | POA: Diagnosis not present

## 2019-05-21 DIAGNOSIS — G8929 Other chronic pain: Secondary | ICD-10-CM | POA: Diagnosis present

## 2019-05-21 DIAGNOSIS — F0390 Unspecified dementia without behavioral disturbance: Secondary | ICD-10-CM | POA: Diagnosis present

## 2019-05-21 DIAGNOSIS — I9589 Other hypotension: Secondary | ICD-10-CM | POA: Diagnosis present

## 2019-05-21 DIAGNOSIS — I132 Hypertensive heart and chronic kidney disease with heart failure and with stage 5 chronic kidney disease, or end stage renal disease: Secondary | ICD-10-CM | POA: Diagnosis present

## 2019-05-21 DIAGNOSIS — I509 Heart failure, unspecified: Secondary | ICD-10-CM | POA: Diagnosis present

## 2019-05-21 DIAGNOSIS — I4891 Unspecified atrial fibrillation: Secondary | ICD-10-CM | POA: Diagnosis present

## 2019-05-21 DIAGNOSIS — D72829 Elevated white blood cell count, unspecified: Secondary | ICD-10-CM | POA: Diagnosis not present

## 2019-05-21 DIAGNOSIS — I959 Hypotension, unspecified: Secondary | ICD-10-CM | POA: Diagnosis not present

## 2019-05-21 DIAGNOSIS — K529 Noninfective gastroenteritis and colitis, unspecified: Secondary | ICD-10-CM | POA: Diagnosis not present

## 2019-05-21 DIAGNOSIS — R000 Tachycardia, unspecified: Secondary | ICD-10-CM | POA: Diagnosis not present

## 2019-05-21 DIAGNOSIS — G40909 Epilepsy, unspecified, not intractable, without status epilepticus: Secondary | ICD-10-CM | POA: Diagnosis present

## 2019-05-21 DIAGNOSIS — R079 Chest pain, unspecified: Secondary | ICD-10-CM | POA: Diagnosis not present

## 2019-05-21 DIAGNOSIS — R0602 Shortness of breath: Secondary | ICD-10-CM | POA: Diagnosis not present

## 2019-05-21 DIAGNOSIS — E785 Hyperlipidemia, unspecified: Secondary | ICD-10-CM | POA: Diagnosis present

## 2019-05-21 DIAGNOSIS — N10 Acute tubulo-interstitial nephritis: Secondary | ICD-10-CM | POA: Diagnosis not present

## 2019-05-21 DIAGNOSIS — Z89511 Acquired absence of right leg below knee: Secondary | ICD-10-CM | POA: Diagnosis not present

## 2019-05-21 DIAGNOSIS — J449 Chronic obstructive pulmonary disease, unspecified: Secondary | ICD-10-CM | POA: Diagnosis present

## 2019-05-21 DIAGNOSIS — E669 Obesity, unspecified: Secondary | ICD-10-CM | POA: Diagnosis present

## 2019-05-21 DIAGNOSIS — I2510 Atherosclerotic heart disease of native coronary artery without angina pectoris: Secondary | ICD-10-CM | POA: Diagnosis present

## 2019-05-21 DIAGNOSIS — N2581 Secondary hyperparathyroidism of renal origin: Secondary | ICD-10-CM | POA: Diagnosis not present

## 2019-05-21 DIAGNOSIS — D631 Anemia in chronic kidney disease: Secondary | ICD-10-CM | POA: Diagnosis not present

## 2019-05-21 DIAGNOSIS — D649 Anemia, unspecified: Secondary | ICD-10-CM | POA: Diagnosis not present

## 2019-05-21 DIAGNOSIS — E1140 Type 2 diabetes mellitus with diabetic neuropathy, unspecified: Secondary | ICD-10-CM | POA: Diagnosis present

## 2019-05-21 DIAGNOSIS — R6520 Severe sepsis without septic shock: Secondary | ICD-10-CM | POA: Diagnosis present

## 2019-05-21 DIAGNOSIS — Z89612 Acquired absence of left leg above knee: Secondary | ICD-10-CM | POA: Diagnosis not present

## 2019-05-21 DIAGNOSIS — R5383 Other fatigue: Secondary | ICD-10-CM | POA: Diagnosis not present

## 2019-05-21 DIAGNOSIS — N186 End stage renal disease: Secondary | ICD-10-CM | POA: Diagnosis present

## 2019-05-21 DIAGNOSIS — R4189 Other symptoms and signs involving cognitive functions and awareness: Secondary | ICD-10-CM | POA: Diagnosis not present

## 2019-05-21 DIAGNOSIS — Z6841 Body Mass Index (BMI) 40.0 and over, adult: Secondary | ICD-10-CM | POA: Diagnosis not present

## 2019-05-21 DIAGNOSIS — I10 Essential (primary) hypertension: Secondary | ICD-10-CM | POA: Diagnosis not present

## 2019-05-21 DIAGNOSIS — E872 Acidosis: Secondary | ICD-10-CM | POA: Diagnosis not present

## 2019-05-26 DIAGNOSIS — I10 Essential (primary) hypertension: Secondary | ICD-10-CM | POA: Diagnosis not present

## 2019-05-26 DIAGNOSIS — D631 Anemia in chronic kidney disease: Secondary | ICD-10-CM | POA: Diagnosis not present

## 2019-05-26 DIAGNOSIS — N186 End stage renal disease: Secondary | ICD-10-CM | POA: Diagnosis not present

## 2019-05-29 DIAGNOSIS — K529 Noninfective gastroenteritis and colitis, unspecified: Secondary | ICD-10-CM | POA: Diagnosis not present

## 2019-05-29 DIAGNOSIS — I5022 Chronic systolic (congestive) heart failure: Secondary | ICD-10-CM | POA: Diagnosis not present

## 2019-05-29 DIAGNOSIS — N186 End stage renal disease: Secondary | ICD-10-CM | POA: Diagnosis not present

## 2019-05-29 DIAGNOSIS — I69351 Hemiplegia and hemiparesis following cerebral infarction affecting right dominant side: Secondary | ICD-10-CM | POA: Diagnosis not present

## 2019-05-29 DIAGNOSIS — E119 Type 2 diabetes mellitus without complications: Secondary | ICD-10-CM | POA: Diagnosis not present

## 2019-06-12 DIAGNOSIS — D631 Anemia in chronic kidney disease: Secondary | ICD-10-CM | POA: Diagnosis not present

## 2019-06-12 DIAGNOSIS — I10 Essential (primary) hypertension: Secondary | ICD-10-CM | POA: Diagnosis not present

## 2019-06-12 DIAGNOSIS — N186 End stage renal disease: Secondary | ICD-10-CM | POA: Diagnosis not present

## 2019-06-13 DIAGNOSIS — D631 Anemia in chronic kidney disease: Secondary | ICD-10-CM | POA: Diagnosis not present

## 2019-06-13 DIAGNOSIS — Z418 Encounter for other procedures for purposes other than remedying health state: Secondary | ICD-10-CM | POA: Diagnosis not present

## 2019-06-13 DIAGNOSIS — E213 Hyperparathyroidism, unspecified: Secondary | ICD-10-CM | POA: Diagnosis not present

## 2019-06-13 DIAGNOSIS — T8249XA Other complication of vascular dialysis catheter, initial encounter: Secondary | ICD-10-CM | POA: Diagnosis not present

## 2019-06-13 DIAGNOSIS — N186 End stage renal disease: Secondary | ICD-10-CM | POA: Diagnosis not present

## 2019-06-13 DIAGNOSIS — I10 Essential (primary) hypertension: Secondary | ICD-10-CM | POA: Diagnosis not present

## 2019-06-13 DIAGNOSIS — N2581 Secondary hyperparathyroidism of renal origin: Secondary | ICD-10-CM | POA: Diagnosis not present

## 2019-06-15 DIAGNOSIS — Z418 Encounter for other procedures for purposes other than remedying health state: Secondary | ICD-10-CM | POA: Diagnosis not present

## 2019-06-15 DIAGNOSIS — N2581 Secondary hyperparathyroidism of renal origin: Secondary | ICD-10-CM | POA: Diagnosis not present

## 2019-06-15 DIAGNOSIS — N186 End stage renal disease: Secondary | ICD-10-CM | POA: Diagnosis not present

## 2019-06-15 DIAGNOSIS — I10 Essential (primary) hypertension: Secondary | ICD-10-CM | POA: Diagnosis not present

## 2019-06-15 DIAGNOSIS — D631 Anemia in chronic kidney disease: Secondary | ICD-10-CM | POA: Diagnosis not present

## 2019-06-15 DIAGNOSIS — E213 Hyperparathyroidism, unspecified: Secondary | ICD-10-CM | POA: Diagnosis not present

## 2019-06-17 DIAGNOSIS — I10 Essential (primary) hypertension: Secondary | ICD-10-CM | POA: Diagnosis not present

## 2019-06-17 DIAGNOSIS — E213 Hyperparathyroidism, unspecified: Secondary | ICD-10-CM | POA: Diagnosis not present

## 2019-06-17 DIAGNOSIS — N186 End stage renal disease: Secondary | ICD-10-CM | POA: Diagnosis not present

## 2019-06-17 DIAGNOSIS — Z418 Encounter for other procedures for purposes other than remedying health state: Secondary | ICD-10-CM | POA: Diagnosis not present

## 2019-06-17 DIAGNOSIS — N2581 Secondary hyperparathyroidism of renal origin: Secondary | ICD-10-CM | POA: Diagnosis not present

## 2019-06-17 DIAGNOSIS — D631 Anemia in chronic kidney disease: Secondary | ICD-10-CM | POA: Diagnosis not present

## 2019-06-19 DIAGNOSIS — N186 End stage renal disease: Secondary | ICD-10-CM | POA: Diagnosis not present

## 2019-06-19 DIAGNOSIS — N2581 Secondary hyperparathyroidism of renal origin: Secondary | ICD-10-CM | POA: Diagnosis not present

## 2019-06-19 DIAGNOSIS — Z418 Encounter for other procedures for purposes other than remedying health state: Secondary | ICD-10-CM | POA: Diagnosis not present

## 2019-06-19 DIAGNOSIS — D631 Anemia in chronic kidney disease: Secondary | ICD-10-CM | POA: Diagnosis not present

## 2019-06-19 DIAGNOSIS — I10 Essential (primary) hypertension: Secondary | ICD-10-CM | POA: Diagnosis not present

## 2019-06-19 DIAGNOSIS — E213 Hyperparathyroidism, unspecified: Secondary | ICD-10-CM | POA: Diagnosis not present

## 2019-06-22 DIAGNOSIS — E213 Hyperparathyroidism, unspecified: Secondary | ICD-10-CM | POA: Diagnosis not present

## 2019-06-22 DIAGNOSIS — E1122 Type 2 diabetes mellitus with diabetic chronic kidney disease: Secondary | ICD-10-CM | POA: Diagnosis not present

## 2019-06-22 DIAGNOSIS — N186 End stage renal disease: Secondary | ICD-10-CM | POA: Diagnosis not present

## 2019-06-22 DIAGNOSIS — D631 Anemia in chronic kidney disease: Secondary | ICD-10-CM | POA: Diagnosis not present

## 2019-06-22 DIAGNOSIS — M150 Primary generalized (osteo)arthritis: Secondary | ICD-10-CM | POA: Diagnosis not present

## 2019-06-22 DIAGNOSIS — Z418 Encounter for other procedures for purposes other than remedying health state: Secondary | ICD-10-CM | POA: Diagnosis not present

## 2019-06-22 DIAGNOSIS — I10 Essential (primary) hypertension: Secondary | ICD-10-CM | POA: Diagnosis not present

## 2019-06-22 DIAGNOSIS — N2581 Secondary hyperparathyroidism of renal origin: Secondary | ICD-10-CM | POA: Diagnosis not present

## 2019-06-22 DIAGNOSIS — I5022 Chronic systolic (congestive) heart failure: Secondary | ICD-10-CM | POA: Diagnosis not present

## 2019-06-24 DIAGNOSIS — I10 Essential (primary) hypertension: Secondary | ICD-10-CM | POA: Diagnosis not present

## 2019-06-24 DIAGNOSIS — E213 Hyperparathyroidism, unspecified: Secondary | ICD-10-CM | POA: Diagnosis not present

## 2019-06-24 DIAGNOSIS — N2581 Secondary hyperparathyroidism of renal origin: Secondary | ICD-10-CM | POA: Diagnosis not present

## 2019-06-24 DIAGNOSIS — Z418 Encounter for other procedures for purposes other than remedying health state: Secondary | ICD-10-CM | POA: Diagnosis not present

## 2019-06-24 DIAGNOSIS — D631 Anemia in chronic kidney disease: Secondary | ICD-10-CM | POA: Diagnosis not present

## 2019-06-24 DIAGNOSIS — N186 End stage renal disease: Secondary | ICD-10-CM | POA: Diagnosis not present

## 2019-06-25 DIAGNOSIS — I69354 Hemiplegia and hemiparesis following cerebral infarction affecting left non-dominant side: Secondary | ICD-10-CM | POA: Diagnosis not present

## 2019-06-25 DIAGNOSIS — I132 Hypertensive heart and chronic kidney disease with heart failure and with stage 5 chronic kidney disease, or end stage renal disease: Secondary | ICD-10-CM | POA: Diagnosis not present

## 2019-06-25 DIAGNOSIS — N186 End stage renal disease: Secondary | ICD-10-CM | POA: Diagnosis not present

## 2019-06-25 DIAGNOSIS — I5022 Chronic systolic (congestive) heart failure: Secondary | ICD-10-CM | POA: Diagnosis not present

## 2019-06-26 DIAGNOSIS — E213 Hyperparathyroidism, unspecified: Secondary | ICD-10-CM | POA: Diagnosis not present

## 2019-06-26 DIAGNOSIS — N186 End stage renal disease: Secondary | ICD-10-CM | POA: Diagnosis not present

## 2019-06-26 DIAGNOSIS — Z418 Encounter for other procedures for purposes other than remedying health state: Secondary | ICD-10-CM | POA: Diagnosis not present

## 2019-06-26 DIAGNOSIS — I10 Essential (primary) hypertension: Secondary | ICD-10-CM | POA: Diagnosis not present

## 2019-06-26 DIAGNOSIS — N2581 Secondary hyperparathyroidism of renal origin: Secondary | ICD-10-CM | POA: Diagnosis not present

## 2019-06-26 DIAGNOSIS — D631 Anemia in chronic kidney disease: Secondary | ICD-10-CM | POA: Diagnosis not present

## 2019-06-29 DIAGNOSIS — N186 End stage renal disease: Secondary | ICD-10-CM | POA: Diagnosis not present

## 2019-06-29 DIAGNOSIS — D631 Anemia in chronic kidney disease: Secondary | ICD-10-CM | POA: Diagnosis not present

## 2019-06-29 DIAGNOSIS — Z418 Encounter for other procedures for purposes other than remedying health state: Secondary | ICD-10-CM | POA: Diagnosis not present

## 2019-06-29 DIAGNOSIS — N2581 Secondary hyperparathyroidism of renal origin: Secondary | ICD-10-CM | POA: Diagnosis not present

## 2019-06-29 DIAGNOSIS — E213 Hyperparathyroidism, unspecified: Secondary | ICD-10-CM | POA: Diagnosis not present

## 2019-06-29 DIAGNOSIS — I10 Essential (primary) hypertension: Secondary | ICD-10-CM | POA: Diagnosis not present

## 2019-07-01 DIAGNOSIS — D631 Anemia in chronic kidney disease: Secondary | ICD-10-CM | POA: Diagnosis not present

## 2019-07-01 DIAGNOSIS — I10 Essential (primary) hypertension: Secondary | ICD-10-CM | POA: Diagnosis not present

## 2019-07-01 DIAGNOSIS — Z418 Encounter for other procedures for purposes other than remedying health state: Secondary | ICD-10-CM | POA: Diagnosis not present

## 2019-07-01 DIAGNOSIS — E213 Hyperparathyroidism, unspecified: Secondary | ICD-10-CM | POA: Diagnosis not present

## 2019-07-01 DIAGNOSIS — N2581 Secondary hyperparathyroidism of renal origin: Secondary | ICD-10-CM | POA: Diagnosis not present

## 2019-07-01 DIAGNOSIS — N186 End stage renal disease: Secondary | ICD-10-CM | POA: Diagnosis not present

## 2019-07-02 DIAGNOSIS — E119 Type 2 diabetes mellitus without complications: Secondary | ICD-10-CM | POA: Diagnosis not present

## 2019-07-02 DIAGNOSIS — I69351 Hemiplegia and hemiparesis following cerebral infarction affecting right dominant side: Secondary | ICD-10-CM | POA: Diagnosis not present

## 2019-07-02 DIAGNOSIS — I5022 Chronic systolic (congestive) heart failure: Secondary | ICD-10-CM | POA: Diagnosis not present

## 2019-07-02 DIAGNOSIS — N186 End stage renal disease: Secondary | ICD-10-CM | POA: Diagnosis not present

## 2019-07-03 DIAGNOSIS — D631 Anemia in chronic kidney disease: Secondary | ICD-10-CM | POA: Diagnosis not present

## 2019-07-03 DIAGNOSIS — N186 End stage renal disease: Secondary | ICD-10-CM | POA: Diagnosis not present

## 2019-07-03 DIAGNOSIS — E213 Hyperparathyroidism, unspecified: Secondary | ICD-10-CM | POA: Diagnosis not present

## 2019-07-03 DIAGNOSIS — I10 Essential (primary) hypertension: Secondary | ICD-10-CM | POA: Diagnosis not present

## 2019-07-03 DIAGNOSIS — N2581 Secondary hyperparathyroidism of renal origin: Secondary | ICD-10-CM | POA: Diagnosis not present

## 2019-07-03 DIAGNOSIS — Z418 Encounter for other procedures for purposes other than remedying health state: Secondary | ICD-10-CM | POA: Diagnosis not present

## 2019-07-06 DIAGNOSIS — N2581 Secondary hyperparathyroidism of renal origin: Secondary | ICD-10-CM | POA: Diagnosis not present

## 2019-07-06 DIAGNOSIS — N186 End stage renal disease: Secondary | ICD-10-CM | POA: Diagnosis not present

## 2019-07-06 DIAGNOSIS — Z418 Encounter for other procedures for purposes other than remedying health state: Secondary | ICD-10-CM | POA: Diagnosis not present

## 2019-07-06 DIAGNOSIS — I10 Essential (primary) hypertension: Secondary | ICD-10-CM | POA: Diagnosis not present

## 2019-07-06 DIAGNOSIS — E213 Hyperparathyroidism, unspecified: Secondary | ICD-10-CM | POA: Diagnosis not present

## 2019-07-06 DIAGNOSIS — D631 Anemia in chronic kidney disease: Secondary | ICD-10-CM | POA: Diagnosis not present

## 2019-07-07 DIAGNOSIS — A419 Sepsis, unspecified organism: Secondary | ICD-10-CM | POA: Diagnosis not present

## 2019-07-07 DIAGNOSIS — I482 Chronic atrial fibrillation, unspecified: Secondary | ICD-10-CM | POA: Diagnosis not present

## 2019-07-07 DIAGNOSIS — F339 Major depressive disorder, recurrent, unspecified: Secondary | ICD-10-CM | POA: Diagnosis not present

## 2019-07-07 DIAGNOSIS — G40909 Epilepsy, unspecified, not intractable, without status epilepticus: Secondary | ICD-10-CM | POA: Diagnosis not present

## 2019-07-07 DIAGNOSIS — Z89612 Acquired absence of left leg above knee: Secondary | ICD-10-CM | POA: Diagnosis not present

## 2019-07-07 DIAGNOSIS — K5900 Constipation, unspecified: Secondary | ICD-10-CM | POA: Diagnosis not present

## 2019-07-07 DIAGNOSIS — I69954 Hemiplegia and hemiparesis following unspecified cerebrovascular disease affecting left non-dominant side: Secondary | ICD-10-CM | POA: Diagnosis not present

## 2019-07-07 DIAGNOSIS — E559 Vitamin D deficiency, unspecified: Secondary | ICD-10-CM | POA: Diagnosis not present

## 2019-07-07 DIAGNOSIS — Z23 Encounter for immunization: Secondary | ICD-10-CM | POA: Diagnosis not present

## 2019-07-07 DIAGNOSIS — D631 Anemia in chronic kidney disease: Secondary | ICD-10-CM | POA: Diagnosis not present

## 2019-07-07 DIAGNOSIS — M24522 Contracture, left elbow: Secondary | ICD-10-CM | POA: Diagnosis not present

## 2019-07-07 DIAGNOSIS — H548 Legal blindness, as defined in USA: Secondary | ICD-10-CM | POA: Diagnosis not present

## 2019-07-07 DIAGNOSIS — E785 Hyperlipidemia, unspecified: Secondary | ICD-10-CM | POA: Diagnosis not present

## 2019-07-07 DIAGNOSIS — Z992 Dependence on renal dialysis: Secondary | ICD-10-CM | POA: Diagnosis not present

## 2019-07-07 DIAGNOSIS — M24542 Contracture, left hand: Secondary | ICD-10-CM | POA: Diagnosis not present

## 2019-07-07 DIAGNOSIS — E1122 Type 2 diabetes mellitus with diabetic chronic kidney disease: Secondary | ICD-10-CM | POA: Diagnosis not present

## 2019-07-07 DIAGNOSIS — I639 Cerebral infarction, unspecified: Secondary | ICD-10-CM | POA: Diagnosis not present

## 2019-07-07 DIAGNOSIS — I10 Essential (primary) hypertension: Secondary | ICD-10-CM | POA: Diagnosis not present

## 2019-07-07 DIAGNOSIS — I5022 Chronic systolic (congestive) heart failure: Secondary | ICD-10-CM | POA: Diagnosis not present

## 2019-07-07 DIAGNOSIS — R1312 Dysphagia, oropharyngeal phase: Secondary | ICD-10-CM | POA: Diagnosis not present

## 2019-07-07 DIAGNOSIS — Z89511 Acquired absence of right leg below knee: Secondary | ICD-10-CM | POA: Diagnosis not present

## 2019-07-07 DIAGNOSIS — I429 Cardiomyopathy, unspecified: Secondary | ICD-10-CM | POA: Diagnosis not present

## 2019-07-07 DIAGNOSIS — N186 End stage renal disease: Secondary | ICD-10-CM | POA: Diagnosis not present

## 2019-07-07 DIAGNOSIS — I739 Peripheral vascular disease, unspecified: Secondary | ICD-10-CM | POA: Diagnosis not present

## 2019-07-08 DIAGNOSIS — R000 Tachycardia, unspecified: Secondary | ICD-10-CM | POA: Diagnosis not present

## 2019-07-08 DIAGNOSIS — R404 Transient alteration of awareness: Secondary | ICD-10-CM | POA: Diagnosis not present

## 2019-07-08 DIAGNOSIS — G459 Transient cerebral ischemic attack, unspecified: Secondary | ICD-10-CM | POA: Diagnosis not present

## 2019-07-08 DIAGNOSIS — M2550 Pain in unspecified joint: Secondary | ICD-10-CM | POA: Diagnosis not present

## 2019-07-08 DIAGNOSIS — I129 Hypertensive chronic kidney disease with stage 1 through stage 4 chronic kidney disease, or unspecified chronic kidney disease: Secondary | ICD-10-CM | POA: Diagnosis not present

## 2019-07-08 DIAGNOSIS — R4182 Altered mental status, unspecified: Secondary | ICD-10-CM | POA: Diagnosis not present

## 2019-07-08 DIAGNOSIS — Z7401 Bed confinement status: Secondary | ICD-10-CM | POA: Diagnosis not present

## 2019-07-08 DIAGNOSIS — N189 Chronic kidney disease, unspecified: Secondary | ICD-10-CM | POA: Diagnosis not present

## 2019-07-08 DIAGNOSIS — N186 End stage renal disease: Secondary | ICD-10-CM | POA: Diagnosis not present

## 2019-07-08 DIAGNOSIS — I959 Hypotension, unspecified: Secondary | ICD-10-CM | POA: Diagnosis not present

## 2019-07-08 DIAGNOSIS — G40909 Epilepsy, unspecified, not intractable, without status epilepticus: Secondary | ICD-10-CM | POA: Diagnosis not present

## 2019-07-08 DIAGNOSIS — R569 Unspecified convulsions: Secondary | ICD-10-CM | POA: Diagnosis not present

## 2019-07-08 DIAGNOSIS — E213 Hyperparathyroidism, unspecified: Secondary | ICD-10-CM | POA: Diagnosis not present

## 2019-07-08 DIAGNOSIS — N390 Urinary tract infection, site not specified: Secondary | ICD-10-CM | POA: Diagnosis not present

## 2019-07-08 DIAGNOSIS — Z418 Encounter for other procedures for purposes other than remedying health state: Secondary | ICD-10-CM | POA: Diagnosis not present

## 2019-07-08 DIAGNOSIS — I10 Essential (primary) hypertension: Secondary | ICD-10-CM | POA: Diagnosis not present

## 2019-07-08 DIAGNOSIS — N2581 Secondary hyperparathyroidism of renal origin: Secondary | ICD-10-CM | POA: Diagnosis not present

## 2019-07-08 DIAGNOSIS — R0902 Hypoxemia: Secondary | ICD-10-CM | POA: Diagnosis not present

## 2019-07-08 DIAGNOSIS — D631 Anemia in chronic kidney disease: Secondary | ICD-10-CM | POA: Diagnosis not present

## 2019-07-08 DIAGNOSIS — R531 Weakness: Secondary | ICD-10-CM | POA: Diagnosis not present

## 2019-07-08 DIAGNOSIS — I4519 Other right bundle-branch block: Secondary | ICD-10-CM | POA: Diagnosis not present

## 2019-07-09 DIAGNOSIS — R000 Tachycardia, unspecified: Secondary | ICD-10-CM | POA: Diagnosis not present

## 2019-07-09 DIAGNOSIS — I4510 Unspecified right bundle-branch block: Secondary | ICD-10-CM | POA: Diagnosis not present

## 2019-07-10 DIAGNOSIS — R1312 Dysphagia, oropharyngeal phase: Secondary | ICD-10-CM | POA: Diagnosis not present

## 2019-07-10 DIAGNOSIS — E213 Hyperparathyroidism, unspecified: Secondary | ICD-10-CM | POA: Diagnosis not present

## 2019-07-10 DIAGNOSIS — A419 Sepsis, unspecified organism: Secondary | ICD-10-CM | POA: Diagnosis not present

## 2019-07-10 DIAGNOSIS — N2581 Secondary hyperparathyroidism of renal origin: Secondary | ICD-10-CM | POA: Diagnosis not present

## 2019-07-10 DIAGNOSIS — M24542 Contracture, left hand: Secondary | ICD-10-CM | POA: Diagnosis not present

## 2019-07-10 DIAGNOSIS — I10 Essential (primary) hypertension: Secondary | ICD-10-CM | POA: Diagnosis not present

## 2019-07-10 DIAGNOSIS — Z418 Encounter for other procedures for purposes other than remedying health state: Secondary | ICD-10-CM | POA: Diagnosis not present

## 2019-07-10 DIAGNOSIS — I69954 Hemiplegia and hemiparesis following unspecified cerebrovascular disease affecting left non-dominant side: Secondary | ICD-10-CM | POA: Diagnosis not present

## 2019-07-10 DIAGNOSIS — N186 End stage renal disease: Secondary | ICD-10-CM | POA: Diagnosis not present

## 2019-07-10 DIAGNOSIS — I639 Cerebral infarction, unspecified: Secondary | ICD-10-CM | POA: Diagnosis not present

## 2019-07-10 DIAGNOSIS — M24522 Contracture, left elbow: Secondary | ICD-10-CM | POA: Diagnosis not present

## 2019-07-10 DIAGNOSIS — D631 Anemia in chronic kidney disease: Secondary | ICD-10-CM | POA: Diagnosis not present

## 2019-07-12 DIAGNOSIS — R899 Unspecified abnormal finding in specimens from other organs, systems and tissues: Secondary | ICD-10-CM | POA: Diagnosis not present

## 2019-07-13 DIAGNOSIS — D509 Iron deficiency anemia, unspecified: Secondary | ICD-10-CM | POA: Diagnosis not present

## 2019-07-13 DIAGNOSIS — I639 Cerebral infarction, unspecified: Secondary | ICD-10-CM | POA: Diagnosis not present

## 2019-07-13 DIAGNOSIS — E559 Vitamin D deficiency, unspecified: Secondary | ICD-10-CM | POA: Diagnosis not present

## 2019-07-13 DIAGNOSIS — I482 Chronic atrial fibrillation, unspecified: Secondary | ICD-10-CM | POA: Diagnosis not present

## 2019-07-13 DIAGNOSIS — Z89511 Acquired absence of right leg below knee: Secondary | ICD-10-CM | POA: Diagnosis not present

## 2019-07-13 DIAGNOSIS — I10 Essential (primary) hypertension: Secondary | ICD-10-CM | POA: Diagnosis not present

## 2019-07-13 DIAGNOSIS — Z418 Encounter for other procedures for purposes other than remedying health state: Secondary | ICD-10-CM | POA: Diagnosis not present

## 2019-07-13 DIAGNOSIS — E785 Hyperlipidemia, unspecified: Secondary | ICD-10-CM | POA: Diagnosis not present

## 2019-07-13 DIAGNOSIS — D631 Anemia in chronic kidney disease: Secondary | ICD-10-CM | POA: Diagnosis not present

## 2019-07-13 DIAGNOSIS — N186 End stage renal disease: Secondary | ICD-10-CM | POA: Diagnosis not present

## 2019-07-13 DIAGNOSIS — I5022 Chronic systolic (congestive) heart failure: Secondary | ICD-10-CM | POA: Diagnosis not present

## 2019-07-13 DIAGNOSIS — R1312 Dysphagia, oropharyngeal phase: Secondary | ICD-10-CM | POA: Diagnosis not present

## 2019-07-13 DIAGNOSIS — Z992 Dependence on renal dialysis: Secondary | ICD-10-CM | POA: Diagnosis not present

## 2019-07-13 DIAGNOSIS — G40909 Epilepsy, unspecified, not intractable, without status epilepticus: Secondary | ICD-10-CM | POA: Diagnosis not present

## 2019-07-13 DIAGNOSIS — N2581 Secondary hyperparathyroidism of renal origin: Secondary | ICD-10-CM | POA: Diagnosis not present

## 2019-07-13 DIAGNOSIS — I69954 Hemiplegia and hemiparesis following unspecified cerebrovascular disease affecting left non-dominant side: Secondary | ICD-10-CM | POA: Diagnosis not present

## 2019-07-13 DIAGNOSIS — Z89612 Acquired absence of left leg above knee: Secondary | ICD-10-CM | POA: Diagnosis not present

## 2019-07-13 DIAGNOSIS — K5900 Constipation, unspecified: Secondary | ICD-10-CM | POA: Diagnosis not present

## 2019-07-13 DIAGNOSIS — E1169 Type 2 diabetes mellitus with other specified complication: Secondary | ICD-10-CM | POA: Diagnosis not present

## 2019-07-13 DIAGNOSIS — T8249XA Other complication of vascular dialysis catheter, initial encounter: Secondary | ICD-10-CM | POA: Diagnosis not present

## 2019-07-13 DIAGNOSIS — M24542 Contracture, left hand: Secondary | ICD-10-CM | POA: Diagnosis not present

## 2019-07-13 DIAGNOSIS — I739 Peripheral vascular disease, unspecified: Secondary | ICD-10-CM | POA: Diagnosis not present

## 2019-07-13 DIAGNOSIS — I429 Cardiomyopathy, unspecified: Secondary | ICD-10-CM | POA: Diagnosis not present

## 2019-07-13 DIAGNOSIS — H548 Legal blindness, as defined in USA: Secondary | ICD-10-CM | POA: Diagnosis not present

## 2019-07-13 DIAGNOSIS — Z8673 Personal history of transient ischemic attack (TIA), and cerebral infarction without residual deficits: Secondary | ICD-10-CM | POA: Diagnosis not present

## 2019-07-13 DIAGNOSIS — M24522 Contracture, left elbow: Secondary | ICD-10-CM | POA: Diagnosis not present

## 2019-07-13 DIAGNOSIS — F339 Major depressive disorder, recurrent, unspecified: Secondary | ICD-10-CM | POA: Diagnosis not present

## 2019-07-13 DIAGNOSIS — E1122 Type 2 diabetes mellitus with diabetic chronic kidney disease: Secondary | ICD-10-CM | POA: Diagnosis not present

## 2019-07-14 DIAGNOSIS — Z8673 Personal history of transient ischemic attack (TIA), and cerebral infarction without residual deficits: Secondary | ICD-10-CM | POA: Diagnosis not present

## 2019-07-14 DIAGNOSIS — Z89612 Acquired absence of left leg above knee: Secondary | ICD-10-CM | POA: Diagnosis not present

## 2019-07-14 DIAGNOSIS — R1312 Dysphagia, oropharyngeal phase: Secondary | ICD-10-CM | POA: Diagnosis not present

## 2019-07-14 DIAGNOSIS — M24522 Contracture, left elbow: Secondary | ICD-10-CM | POA: Diagnosis not present

## 2019-07-14 DIAGNOSIS — M24542 Contracture, left hand: Secondary | ICD-10-CM | POA: Diagnosis not present

## 2019-07-14 DIAGNOSIS — I69954 Hemiplegia and hemiparesis following unspecified cerebrovascular disease affecting left non-dominant side: Secondary | ICD-10-CM | POA: Diagnosis not present

## 2019-07-15 DIAGNOSIS — N2581 Secondary hyperparathyroidism of renal origin: Secondary | ICD-10-CM | POA: Diagnosis not present

## 2019-07-15 DIAGNOSIS — M24522 Contracture, left elbow: Secondary | ICD-10-CM | POA: Diagnosis not present

## 2019-07-15 DIAGNOSIS — I69954 Hemiplegia and hemiparesis following unspecified cerebrovascular disease affecting left non-dominant side: Secondary | ICD-10-CM | POA: Diagnosis not present

## 2019-07-15 DIAGNOSIS — Z8673 Personal history of transient ischemic attack (TIA), and cerebral infarction without residual deficits: Secondary | ICD-10-CM | POA: Diagnosis not present

## 2019-07-15 DIAGNOSIS — Z89612 Acquired absence of left leg above knee: Secondary | ICD-10-CM | POA: Diagnosis not present

## 2019-07-15 DIAGNOSIS — Z418 Encounter for other procedures for purposes other than remedying health state: Secondary | ICD-10-CM | POA: Diagnosis not present

## 2019-07-15 DIAGNOSIS — D631 Anemia in chronic kidney disease: Secondary | ICD-10-CM | POA: Diagnosis not present

## 2019-07-15 DIAGNOSIS — N186 End stage renal disease: Secondary | ICD-10-CM | POA: Diagnosis not present

## 2019-07-15 DIAGNOSIS — T8249XA Other complication of vascular dialysis catheter, initial encounter: Secondary | ICD-10-CM | POA: Diagnosis not present

## 2019-07-15 DIAGNOSIS — R1312 Dysphagia, oropharyngeal phase: Secondary | ICD-10-CM | POA: Diagnosis not present

## 2019-07-15 DIAGNOSIS — D509 Iron deficiency anemia, unspecified: Secondary | ICD-10-CM | POA: Diagnosis not present

## 2019-07-15 DIAGNOSIS — M24542 Contracture, left hand: Secondary | ICD-10-CM | POA: Diagnosis not present

## 2019-07-16 DIAGNOSIS — M24542 Contracture, left hand: Secondary | ICD-10-CM | POA: Diagnosis not present

## 2019-07-16 DIAGNOSIS — I69954 Hemiplegia and hemiparesis following unspecified cerebrovascular disease affecting left non-dominant side: Secondary | ICD-10-CM | POA: Diagnosis not present

## 2019-07-16 DIAGNOSIS — Z89612 Acquired absence of left leg above knee: Secondary | ICD-10-CM | POA: Diagnosis not present

## 2019-07-16 DIAGNOSIS — N390 Urinary tract infection, site not specified: Secondary | ICD-10-CM | POA: Diagnosis not present

## 2019-07-16 DIAGNOSIS — R1312 Dysphagia, oropharyngeal phase: Secondary | ICD-10-CM | POA: Diagnosis not present

## 2019-07-16 DIAGNOSIS — Z8673 Personal history of transient ischemic attack (TIA), and cerebral infarction without residual deficits: Secondary | ICD-10-CM | POA: Diagnosis not present

## 2019-07-16 DIAGNOSIS — M24522 Contracture, left elbow: Secondary | ICD-10-CM | POA: Diagnosis not present

## 2019-07-17 DIAGNOSIS — Z89612 Acquired absence of left leg above knee: Secondary | ICD-10-CM | POA: Diagnosis not present

## 2019-07-17 DIAGNOSIS — I69954 Hemiplegia and hemiparesis following unspecified cerebrovascular disease affecting left non-dominant side: Secondary | ICD-10-CM | POA: Diagnosis not present

## 2019-07-17 DIAGNOSIS — N186 End stage renal disease: Secondary | ICD-10-CM | POA: Diagnosis not present

## 2019-07-17 DIAGNOSIS — T8249XA Other complication of vascular dialysis catheter, initial encounter: Secondary | ICD-10-CM | POA: Diagnosis not present

## 2019-07-17 DIAGNOSIS — D631 Anemia in chronic kidney disease: Secondary | ICD-10-CM | POA: Diagnosis not present

## 2019-07-17 DIAGNOSIS — Z8673 Personal history of transient ischemic attack (TIA), and cerebral infarction without residual deficits: Secondary | ICD-10-CM | POA: Diagnosis not present

## 2019-07-17 DIAGNOSIS — D509 Iron deficiency anemia, unspecified: Secondary | ICD-10-CM | POA: Diagnosis not present

## 2019-07-17 DIAGNOSIS — Z418 Encounter for other procedures for purposes other than remedying health state: Secondary | ICD-10-CM | POA: Diagnosis not present

## 2019-07-17 DIAGNOSIS — N2581 Secondary hyperparathyroidism of renal origin: Secondary | ICD-10-CM | POA: Diagnosis not present

## 2019-07-17 DIAGNOSIS — R1312 Dysphagia, oropharyngeal phase: Secondary | ICD-10-CM | POA: Diagnosis not present

## 2019-07-17 DIAGNOSIS — M24522 Contracture, left elbow: Secondary | ICD-10-CM | POA: Diagnosis not present

## 2019-07-17 DIAGNOSIS — M24542 Contracture, left hand: Secondary | ICD-10-CM | POA: Diagnosis not present

## 2019-07-20 DIAGNOSIS — Z8673 Personal history of transient ischemic attack (TIA), and cerebral infarction without residual deficits: Secondary | ICD-10-CM | POA: Diagnosis not present

## 2019-07-20 DIAGNOSIS — N2581 Secondary hyperparathyroidism of renal origin: Secondary | ICD-10-CM | POA: Diagnosis not present

## 2019-07-20 DIAGNOSIS — M24542 Contracture, left hand: Secondary | ICD-10-CM | POA: Diagnosis not present

## 2019-07-20 DIAGNOSIS — N186 End stage renal disease: Secondary | ICD-10-CM | POA: Diagnosis not present

## 2019-07-20 DIAGNOSIS — T8249XA Other complication of vascular dialysis catheter, initial encounter: Secondary | ICD-10-CM | POA: Diagnosis not present

## 2019-07-20 DIAGNOSIS — M24522 Contracture, left elbow: Secondary | ICD-10-CM | POA: Diagnosis not present

## 2019-07-20 DIAGNOSIS — Z418 Encounter for other procedures for purposes other than remedying health state: Secondary | ICD-10-CM | POA: Diagnosis not present

## 2019-07-20 DIAGNOSIS — I69954 Hemiplegia and hemiparesis following unspecified cerebrovascular disease affecting left non-dominant side: Secondary | ICD-10-CM | POA: Diagnosis not present

## 2019-07-20 DIAGNOSIS — D631 Anemia in chronic kidney disease: Secondary | ICD-10-CM | POA: Diagnosis not present

## 2019-07-20 DIAGNOSIS — R1312 Dysphagia, oropharyngeal phase: Secondary | ICD-10-CM | POA: Diagnosis not present

## 2019-07-20 DIAGNOSIS — Z89612 Acquired absence of left leg above knee: Secondary | ICD-10-CM | POA: Diagnosis not present

## 2019-07-20 DIAGNOSIS — D509 Iron deficiency anemia, unspecified: Secondary | ICD-10-CM | POA: Diagnosis not present

## 2019-07-21 DIAGNOSIS — Z8673 Personal history of transient ischemic attack (TIA), and cerebral infarction without residual deficits: Secondary | ICD-10-CM | POA: Diagnosis not present

## 2019-07-21 DIAGNOSIS — I69954 Hemiplegia and hemiparesis following unspecified cerebrovascular disease affecting left non-dominant side: Secondary | ICD-10-CM | POA: Diagnosis not present

## 2019-07-21 DIAGNOSIS — M24542 Contracture, left hand: Secondary | ICD-10-CM | POA: Diagnosis not present

## 2019-07-21 DIAGNOSIS — R1312 Dysphagia, oropharyngeal phase: Secondary | ICD-10-CM | POA: Diagnosis not present

## 2019-07-21 DIAGNOSIS — Z89612 Acquired absence of left leg above knee: Secondary | ICD-10-CM | POA: Diagnosis not present

## 2019-07-21 DIAGNOSIS — M24522 Contracture, left elbow: Secondary | ICD-10-CM | POA: Diagnosis not present

## 2019-07-22 DIAGNOSIS — I5022 Chronic systolic (congestive) heart failure: Secondary | ICD-10-CM | POA: Diagnosis not present

## 2019-07-22 DIAGNOSIS — I10 Essential (primary) hypertension: Secondary | ICD-10-CM | POA: Diagnosis not present

## 2019-07-22 DIAGNOSIS — E1122 Type 2 diabetes mellitus with diabetic chronic kidney disease: Secondary | ICD-10-CM | POA: Diagnosis not present

## 2019-07-22 DIAGNOSIS — N186 End stage renal disease: Secondary | ICD-10-CM | POA: Diagnosis not present

## 2019-07-22 DIAGNOSIS — Z418 Encounter for other procedures for purposes other than remedying health state: Secondary | ICD-10-CM | POA: Diagnosis not present

## 2019-07-22 DIAGNOSIS — R1312 Dysphagia, oropharyngeal phase: Secondary | ICD-10-CM | POA: Diagnosis not present

## 2019-07-22 DIAGNOSIS — M24522 Contracture, left elbow: Secondary | ICD-10-CM | POA: Diagnosis not present

## 2019-07-22 DIAGNOSIS — Z89612 Acquired absence of left leg above knee: Secondary | ICD-10-CM | POA: Diagnosis not present

## 2019-07-22 DIAGNOSIS — M150 Primary generalized (osteo)arthritis: Secondary | ICD-10-CM | POA: Diagnosis not present

## 2019-07-22 DIAGNOSIS — D509 Iron deficiency anemia, unspecified: Secondary | ICD-10-CM | POA: Diagnosis not present

## 2019-07-22 DIAGNOSIS — D631 Anemia in chronic kidney disease: Secondary | ICD-10-CM | POA: Diagnosis not present

## 2019-07-22 DIAGNOSIS — Z8673 Personal history of transient ischemic attack (TIA), and cerebral infarction without residual deficits: Secondary | ICD-10-CM | POA: Diagnosis not present

## 2019-07-22 DIAGNOSIS — N2581 Secondary hyperparathyroidism of renal origin: Secondary | ICD-10-CM | POA: Diagnosis not present

## 2019-07-22 DIAGNOSIS — I69954 Hemiplegia and hemiparesis following unspecified cerebrovascular disease affecting left non-dominant side: Secondary | ICD-10-CM | POA: Diagnosis not present

## 2019-07-22 DIAGNOSIS — T8249XA Other complication of vascular dialysis catheter, initial encounter: Secondary | ICD-10-CM | POA: Diagnosis not present

## 2019-07-22 DIAGNOSIS — M24542 Contracture, left hand: Secondary | ICD-10-CM | POA: Diagnosis not present

## 2019-07-23 DIAGNOSIS — N186 End stage renal disease: Secondary | ICD-10-CM | POA: Diagnosis not present

## 2019-07-23 DIAGNOSIS — M24522 Contracture, left elbow: Secondary | ICD-10-CM | POA: Diagnosis not present

## 2019-07-23 DIAGNOSIS — M24542 Contracture, left hand: Secondary | ICD-10-CM | POA: Diagnosis not present

## 2019-07-23 DIAGNOSIS — I69954 Hemiplegia and hemiparesis following unspecified cerebrovascular disease affecting left non-dominant side: Secondary | ICD-10-CM | POA: Diagnosis not present

## 2019-07-23 DIAGNOSIS — G40909 Epilepsy, unspecified, not intractable, without status epilepticus: Secondary | ICD-10-CM | POA: Diagnosis not present

## 2019-07-23 DIAGNOSIS — R1312 Dysphagia, oropharyngeal phase: Secondary | ICD-10-CM | POA: Diagnosis not present

## 2019-07-23 DIAGNOSIS — I5022 Chronic systolic (congestive) heart failure: Secondary | ICD-10-CM | POA: Diagnosis not present

## 2019-07-23 DIAGNOSIS — I69391 Dysphagia following cerebral infarction: Secondary | ICD-10-CM | POA: Diagnosis not present

## 2019-07-23 DIAGNOSIS — Z89612 Acquired absence of left leg above knee: Secondary | ICD-10-CM | POA: Diagnosis not present

## 2019-07-23 DIAGNOSIS — Z8673 Personal history of transient ischemic attack (TIA), and cerebral infarction without residual deficits: Secondary | ICD-10-CM | POA: Diagnosis not present

## 2019-07-24 DIAGNOSIS — D509 Iron deficiency anemia, unspecified: Secondary | ICD-10-CM | POA: Diagnosis not present

## 2019-07-24 DIAGNOSIS — M24522 Contracture, left elbow: Secondary | ICD-10-CM | POA: Diagnosis not present

## 2019-07-24 DIAGNOSIS — M24542 Contracture, left hand: Secondary | ICD-10-CM | POA: Diagnosis not present

## 2019-07-24 DIAGNOSIS — N186 End stage renal disease: Secondary | ICD-10-CM | POA: Diagnosis not present

## 2019-07-24 DIAGNOSIS — N2581 Secondary hyperparathyroidism of renal origin: Secondary | ICD-10-CM | POA: Diagnosis not present

## 2019-07-24 DIAGNOSIS — Z89612 Acquired absence of left leg above knee: Secondary | ICD-10-CM | POA: Diagnosis not present

## 2019-07-24 DIAGNOSIS — Z8673 Personal history of transient ischemic attack (TIA), and cerebral infarction without residual deficits: Secondary | ICD-10-CM | POA: Diagnosis not present

## 2019-07-24 DIAGNOSIS — Z418 Encounter for other procedures for purposes other than remedying health state: Secondary | ICD-10-CM | POA: Diagnosis not present

## 2019-07-24 DIAGNOSIS — R1312 Dysphagia, oropharyngeal phase: Secondary | ICD-10-CM | POA: Diagnosis not present

## 2019-07-24 DIAGNOSIS — I69954 Hemiplegia and hemiparesis following unspecified cerebrovascular disease affecting left non-dominant side: Secondary | ICD-10-CM | POA: Diagnosis not present

## 2019-07-24 DIAGNOSIS — D631 Anemia in chronic kidney disease: Secondary | ICD-10-CM | POA: Diagnosis not present

## 2019-07-24 DIAGNOSIS — T8249XA Other complication of vascular dialysis catheter, initial encounter: Secondary | ICD-10-CM | POA: Diagnosis not present

## 2019-07-27 DIAGNOSIS — Z418 Encounter for other procedures for purposes other than remedying health state: Secondary | ICD-10-CM | POA: Diagnosis not present

## 2019-07-27 DIAGNOSIS — N2581 Secondary hyperparathyroidism of renal origin: Secondary | ICD-10-CM | POA: Diagnosis not present

## 2019-07-27 DIAGNOSIS — Z8673 Personal history of transient ischemic attack (TIA), and cerebral infarction without residual deficits: Secondary | ICD-10-CM | POA: Diagnosis not present

## 2019-07-27 DIAGNOSIS — D509 Iron deficiency anemia, unspecified: Secondary | ICD-10-CM | POA: Diagnosis not present

## 2019-07-27 DIAGNOSIS — N186 End stage renal disease: Secondary | ICD-10-CM | POA: Diagnosis not present

## 2019-07-27 DIAGNOSIS — Z89612 Acquired absence of left leg above knee: Secondary | ICD-10-CM | POA: Diagnosis not present

## 2019-07-27 DIAGNOSIS — R1312 Dysphagia, oropharyngeal phase: Secondary | ICD-10-CM | POA: Diagnosis not present

## 2019-07-27 DIAGNOSIS — I69954 Hemiplegia and hemiparesis following unspecified cerebrovascular disease affecting left non-dominant side: Secondary | ICD-10-CM | POA: Diagnosis not present

## 2019-07-27 DIAGNOSIS — T8249XA Other complication of vascular dialysis catheter, initial encounter: Secondary | ICD-10-CM | POA: Diagnosis not present

## 2019-07-27 DIAGNOSIS — M24542 Contracture, left hand: Secondary | ICD-10-CM | POA: Diagnosis not present

## 2019-07-27 DIAGNOSIS — D631 Anemia in chronic kidney disease: Secondary | ICD-10-CM | POA: Diagnosis not present

## 2019-07-27 DIAGNOSIS — M24522 Contracture, left elbow: Secondary | ICD-10-CM | POA: Diagnosis not present

## 2019-07-28 DIAGNOSIS — I69954 Hemiplegia and hemiparesis following unspecified cerebrovascular disease affecting left non-dominant side: Secondary | ICD-10-CM | POA: Diagnosis not present

## 2019-07-28 DIAGNOSIS — Z89612 Acquired absence of left leg above knee: Secondary | ICD-10-CM | POA: Diagnosis not present

## 2019-07-28 DIAGNOSIS — M24522 Contracture, left elbow: Secondary | ICD-10-CM | POA: Diagnosis not present

## 2019-07-28 DIAGNOSIS — R1312 Dysphagia, oropharyngeal phase: Secondary | ICD-10-CM | POA: Diagnosis not present

## 2019-07-28 DIAGNOSIS — Z8673 Personal history of transient ischemic attack (TIA), and cerebral infarction without residual deficits: Secondary | ICD-10-CM | POA: Diagnosis not present

## 2019-07-28 DIAGNOSIS — M24542 Contracture, left hand: Secondary | ICD-10-CM | POA: Diagnosis not present

## 2019-07-29 DIAGNOSIS — T8249XA Other complication of vascular dialysis catheter, initial encounter: Secondary | ICD-10-CM | POA: Diagnosis not present

## 2019-07-29 DIAGNOSIS — Z89612 Acquired absence of left leg above knee: Secondary | ICD-10-CM | POA: Diagnosis not present

## 2019-07-29 DIAGNOSIS — I69954 Hemiplegia and hemiparesis following unspecified cerebrovascular disease affecting left non-dominant side: Secondary | ICD-10-CM | POA: Diagnosis not present

## 2019-07-29 DIAGNOSIS — N2581 Secondary hyperparathyroidism of renal origin: Secondary | ICD-10-CM | POA: Diagnosis not present

## 2019-07-29 DIAGNOSIS — N186 End stage renal disease: Secondary | ICD-10-CM | POA: Diagnosis not present

## 2019-07-29 DIAGNOSIS — M24522 Contracture, left elbow: Secondary | ICD-10-CM | POA: Diagnosis not present

## 2019-07-29 DIAGNOSIS — D631 Anemia in chronic kidney disease: Secondary | ICD-10-CM | POA: Diagnosis not present

## 2019-07-29 DIAGNOSIS — R1312 Dysphagia, oropharyngeal phase: Secondary | ICD-10-CM | POA: Diagnosis not present

## 2019-07-29 DIAGNOSIS — M24542 Contracture, left hand: Secondary | ICD-10-CM | POA: Diagnosis not present

## 2019-07-29 DIAGNOSIS — D509 Iron deficiency anemia, unspecified: Secondary | ICD-10-CM | POA: Diagnosis not present

## 2019-07-29 DIAGNOSIS — Z418 Encounter for other procedures for purposes other than remedying health state: Secondary | ICD-10-CM | POA: Diagnosis not present

## 2019-07-29 DIAGNOSIS — Z8673 Personal history of transient ischemic attack (TIA), and cerebral infarction without residual deficits: Secondary | ICD-10-CM | POA: Diagnosis not present

## 2019-07-30 DIAGNOSIS — M24522 Contracture, left elbow: Secondary | ICD-10-CM | POA: Diagnosis not present

## 2019-07-30 DIAGNOSIS — M24542 Contracture, left hand: Secondary | ICD-10-CM | POA: Diagnosis not present

## 2019-07-30 DIAGNOSIS — Z8673 Personal history of transient ischemic attack (TIA), and cerebral infarction without residual deficits: Secondary | ICD-10-CM | POA: Diagnosis not present

## 2019-07-30 DIAGNOSIS — R1312 Dysphagia, oropharyngeal phase: Secondary | ICD-10-CM | POA: Diagnosis not present

## 2019-07-30 DIAGNOSIS — Z89612 Acquired absence of left leg above knee: Secondary | ICD-10-CM | POA: Diagnosis not present

## 2019-07-30 DIAGNOSIS — I69954 Hemiplegia and hemiparesis following unspecified cerebrovascular disease affecting left non-dominant side: Secondary | ICD-10-CM | POA: Diagnosis not present

## 2019-07-31 DIAGNOSIS — I69954 Hemiplegia and hemiparesis following unspecified cerebrovascular disease affecting left non-dominant side: Secondary | ICD-10-CM | POA: Diagnosis not present

## 2019-07-31 DIAGNOSIS — Z418 Encounter for other procedures for purposes other than remedying health state: Secondary | ICD-10-CM | POA: Diagnosis not present

## 2019-07-31 DIAGNOSIS — Z89612 Acquired absence of left leg above knee: Secondary | ICD-10-CM | POA: Diagnosis not present

## 2019-07-31 DIAGNOSIS — Z8673 Personal history of transient ischemic attack (TIA), and cerebral infarction without residual deficits: Secondary | ICD-10-CM | POA: Diagnosis not present

## 2019-07-31 DIAGNOSIS — D509 Iron deficiency anemia, unspecified: Secondary | ICD-10-CM | POA: Diagnosis not present

## 2019-07-31 DIAGNOSIS — R1312 Dysphagia, oropharyngeal phase: Secondary | ICD-10-CM | POA: Diagnosis not present

## 2019-07-31 DIAGNOSIS — M24542 Contracture, left hand: Secondary | ICD-10-CM | POA: Diagnosis not present

## 2019-07-31 DIAGNOSIS — D631 Anemia in chronic kidney disease: Secondary | ICD-10-CM | POA: Diagnosis not present

## 2019-07-31 DIAGNOSIS — N2581 Secondary hyperparathyroidism of renal origin: Secondary | ICD-10-CM | POA: Diagnosis not present

## 2019-07-31 DIAGNOSIS — M24522 Contracture, left elbow: Secondary | ICD-10-CM | POA: Diagnosis not present

## 2019-07-31 DIAGNOSIS — N186 End stage renal disease: Secondary | ICD-10-CM | POA: Diagnosis not present

## 2019-07-31 DIAGNOSIS — T8249XA Other complication of vascular dialysis catheter, initial encounter: Secondary | ICD-10-CM | POA: Diagnosis not present

## 2019-08-03 DIAGNOSIS — N2581 Secondary hyperparathyroidism of renal origin: Secondary | ICD-10-CM | POA: Diagnosis not present

## 2019-08-03 DIAGNOSIS — I69351 Hemiplegia and hemiparesis following cerebral infarction affecting right dominant side: Secondary | ICD-10-CM | POA: Diagnosis not present

## 2019-08-03 DIAGNOSIS — E119 Type 2 diabetes mellitus without complications: Secondary | ICD-10-CM | POA: Diagnosis not present

## 2019-08-03 DIAGNOSIS — Z8673 Personal history of transient ischemic attack (TIA), and cerebral infarction without residual deficits: Secondary | ICD-10-CM | POA: Diagnosis not present

## 2019-08-03 DIAGNOSIS — M24542 Contracture, left hand: Secondary | ICD-10-CM | POA: Diagnosis not present

## 2019-08-03 DIAGNOSIS — M24522 Contracture, left elbow: Secondary | ICD-10-CM | POA: Diagnosis not present

## 2019-08-03 DIAGNOSIS — T8249XA Other complication of vascular dialysis catheter, initial encounter: Secondary | ICD-10-CM | POA: Diagnosis not present

## 2019-08-03 DIAGNOSIS — I69954 Hemiplegia and hemiparesis following unspecified cerebrovascular disease affecting left non-dominant side: Secondary | ICD-10-CM | POA: Diagnosis not present

## 2019-08-03 DIAGNOSIS — N186 End stage renal disease: Secondary | ICD-10-CM | POA: Diagnosis not present

## 2019-08-03 DIAGNOSIS — D631 Anemia in chronic kidney disease: Secondary | ICD-10-CM | POA: Diagnosis not present

## 2019-08-03 DIAGNOSIS — I739 Peripheral vascular disease, unspecified: Secondary | ICD-10-CM | POA: Diagnosis not present

## 2019-08-03 DIAGNOSIS — Z89612 Acquired absence of left leg above knee: Secondary | ICD-10-CM | POA: Diagnosis not present

## 2019-08-03 DIAGNOSIS — Z418 Encounter for other procedures for purposes other than remedying health state: Secondary | ICD-10-CM | POA: Diagnosis not present

## 2019-08-03 DIAGNOSIS — R1312 Dysphagia, oropharyngeal phase: Secondary | ICD-10-CM | POA: Diagnosis not present

## 2019-08-03 DIAGNOSIS — D509 Iron deficiency anemia, unspecified: Secondary | ICD-10-CM | POA: Diagnosis not present

## 2019-08-04 DIAGNOSIS — Z89612 Acquired absence of left leg above knee: Secondary | ICD-10-CM | POA: Diagnosis not present

## 2019-08-04 DIAGNOSIS — M24522 Contracture, left elbow: Secondary | ICD-10-CM | POA: Diagnosis not present

## 2019-08-04 DIAGNOSIS — I69954 Hemiplegia and hemiparesis following unspecified cerebrovascular disease affecting left non-dominant side: Secondary | ICD-10-CM | POA: Diagnosis not present

## 2019-08-04 DIAGNOSIS — M24542 Contracture, left hand: Secondary | ICD-10-CM | POA: Diagnosis not present

## 2019-08-04 DIAGNOSIS — R1312 Dysphagia, oropharyngeal phase: Secondary | ICD-10-CM | POA: Diagnosis not present

## 2019-08-04 DIAGNOSIS — Z8673 Personal history of transient ischemic attack (TIA), and cerebral infarction without residual deficits: Secondary | ICD-10-CM | POA: Diagnosis not present

## 2019-08-05 DIAGNOSIS — R1312 Dysphagia, oropharyngeal phase: Secondary | ICD-10-CM | POA: Diagnosis not present

## 2019-08-05 DIAGNOSIS — D631 Anemia in chronic kidney disease: Secondary | ICD-10-CM | POA: Diagnosis not present

## 2019-08-05 DIAGNOSIS — T8249XA Other complication of vascular dialysis catheter, initial encounter: Secondary | ICD-10-CM | POA: Diagnosis not present

## 2019-08-05 DIAGNOSIS — M24542 Contracture, left hand: Secondary | ICD-10-CM | POA: Diagnosis not present

## 2019-08-05 DIAGNOSIS — Z418 Encounter for other procedures for purposes other than remedying health state: Secondary | ICD-10-CM | POA: Diagnosis not present

## 2019-08-05 DIAGNOSIS — Z8673 Personal history of transient ischemic attack (TIA), and cerebral infarction without residual deficits: Secondary | ICD-10-CM | POA: Diagnosis not present

## 2019-08-05 DIAGNOSIS — D509 Iron deficiency anemia, unspecified: Secondary | ICD-10-CM | POA: Diagnosis not present

## 2019-08-05 DIAGNOSIS — N186 End stage renal disease: Secondary | ICD-10-CM | POA: Diagnosis not present

## 2019-08-05 DIAGNOSIS — Z89612 Acquired absence of left leg above knee: Secondary | ICD-10-CM | POA: Diagnosis not present

## 2019-08-05 DIAGNOSIS — M24522 Contracture, left elbow: Secondary | ICD-10-CM | POA: Diagnosis not present

## 2019-08-05 DIAGNOSIS — N2581 Secondary hyperparathyroidism of renal origin: Secondary | ICD-10-CM | POA: Diagnosis not present

## 2019-08-05 DIAGNOSIS — I69954 Hemiplegia and hemiparesis following unspecified cerebrovascular disease affecting left non-dominant side: Secondary | ICD-10-CM | POA: Diagnosis not present

## 2019-08-06 DIAGNOSIS — M24542 Contracture, left hand: Secondary | ICD-10-CM | POA: Diagnosis not present

## 2019-08-06 DIAGNOSIS — M24522 Contracture, left elbow: Secondary | ICD-10-CM | POA: Diagnosis not present

## 2019-08-06 DIAGNOSIS — R1312 Dysphagia, oropharyngeal phase: Secondary | ICD-10-CM | POA: Diagnosis not present

## 2019-08-06 DIAGNOSIS — I69954 Hemiplegia and hemiparesis following unspecified cerebrovascular disease affecting left non-dominant side: Secondary | ICD-10-CM | POA: Diagnosis not present

## 2019-08-06 DIAGNOSIS — Z8673 Personal history of transient ischemic attack (TIA), and cerebral infarction without residual deficits: Secondary | ICD-10-CM | POA: Diagnosis not present

## 2019-08-06 DIAGNOSIS — Z89612 Acquired absence of left leg above knee: Secondary | ICD-10-CM | POA: Diagnosis not present

## 2019-08-07 DIAGNOSIS — Z8673 Personal history of transient ischemic attack (TIA), and cerebral infarction without residual deficits: Secondary | ICD-10-CM | POA: Diagnosis not present

## 2019-08-07 DIAGNOSIS — M24542 Contracture, left hand: Secondary | ICD-10-CM | POA: Diagnosis not present

## 2019-08-07 DIAGNOSIS — R1312 Dysphagia, oropharyngeal phase: Secondary | ICD-10-CM | POA: Diagnosis not present

## 2019-08-07 DIAGNOSIS — Z89612 Acquired absence of left leg above knee: Secondary | ICD-10-CM | POA: Diagnosis not present

## 2019-08-07 DIAGNOSIS — D631 Anemia in chronic kidney disease: Secondary | ICD-10-CM | POA: Diagnosis not present

## 2019-08-07 DIAGNOSIS — N2581 Secondary hyperparathyroidism of renal origin: Secondary | ICD-10-CM | POA: Diagnosis not present

## 2019-08-07 DIAGNOSIS — D509 Iron deficiency anemia, unspecified: Secondary | ICD-10-CM | POA: Diagnosis not present

## 2019-08-07 DIAGNOSIS — T8249XA Other complication of vascular dialysis catheter, initial encounter: Secondary | ICD-10-CM | POA: Diagnosis not present

## 2019-08-07 DIAGNOSIS — N186 End stage renal disease: Secondary | ICD-10-CM | POA: Diagnosis not present

## 2019-08-07 DIAGNOSIS — M24522 Contracture, left elbow: Secondary | ICD-10-CM | POA: Diagnosis not present

## 2019-08-07 DIAGNOSIS — Z418 Encounter for other procedures for purposes other than remedying health state: Secondary | ICD-10-CM | POA: Diagnosis not present

## 2019-08-07 DIAGNOSIS — I69954 Hemiplegia and hemiparesis following unspecified cerebrovascular disease affecting left non-dominant side: Secondary | ICD-10-CM | POA: Diagnosis not present

## 2019-08-10 DIAGNOSIS — E1122 Type 2 diabetes mellitus with diabetic chronic kidney disease: Secondary | ICD-10-CM | POA: Diagnosis not present

## 2019-08-10 DIAGNOSIS — I639 Cerebral infarction, unspecified: Secondary | ICD-10-CM | POA: Diagnosis not present

## 2019-08-10 DIAGNOSIS — Z8673 Personal history of transient ischemic attack (TIA), and cerebral infarction without residual deficits: Secondary | ICD-10-CM | POA: Diagnosis not present

## 2019-08-10 DIAGNOSIS — I429 Cardiomyopathy, unspecified: Secondary | ICD-10-CM | POA: Diagnosis not present

## 2019-08-10 DIAGNOSIS — I69954 Hemiplegia and hemiparesis following unspecified cerebrovascular disease affecting left non-dominant side: Secondary | ICD-10-CM | POA: Diagnosis not present

## 2019-08-10 DIAGNOSIS — Z89511 Acquired absence of right leg below knee: Secondary | ICD-10-CM | POA: Diagnosis not present

## 2019-08-10 DIAGNOSIS — I739 Peripheral vascular disease, unspecified: Secondary | ICD-10-CM | POA: Diagnosis not present

## 2019-08-10 DIAGNOSIS — I5022 Chronic systolic (congestive) heart failure: Secondary | ICD-10-CM | POA: Diagnosis not present

## 2019-08-10 DIAGNOSIS — E1169 Type 2 diabetes mellitus with other specified complication: Secondary | ICD-10-CM | POA: Diagnosis not present

## 2019-08-10 DIAGNOSIS — E559 Vitamin D deficiency, unspecified: Secondary | ICD-10-CM | POA: Diagnosis not present

## 2019-08-10 DIAGNOSIS — E785 Hyperlipidemia, unspecified: Secondary | ICD-10-CM | POA: Diagnosis not present

## 2019-08-10 DIAGNOSIS — N186 End stage renal disease: Secondary | ICD-10-CM | POA: Diagnosis not present

## 2019-08-10 DIAGNOSIS — M24522 Contracture, left elbow: Secondary | ICD-10-CM | POA: Diagnosis not present

## 2019-08-10 DIAGNOSIS — I482 Chronic atrial fibrillation, unspecified: Secondary | ICD-10-CM | POA: Diagnosis not present

## 2019-08-10 DIAGNOSIS — H548 Legal blindness, as defined in USA: Secondary | ICD-10-CM | POA: Diagnosis not present

## 2019-08-10 DIAGNOSIS — D509 Iron deficiency anemia, unspecified: Secondary | ICD-10-CM | POA: Diagnosis not present

## 2019-08-10 DIAGNOSIS — F339 Major depressive disorder, recurrent, unspecified: Secondary | ICD-10-CM | POA: Diagnosis not present

## 2019-08-10 DIAGNOSIS — Z992 Dependence on renal dialysis: Secondary | ICD-10-CM | POA: Diagnosis not present

## 2019-08-10 DIAGNOSIS — K5900 Constipation, unspecified: Secondary | ICD-10-CM | POA: Diagnosis not present

## 2019-08-10 DIAGNOSIS — I10 Essential (primary) hypertension: Secondary | ICD-10-CM | POA: Diagnosis not present

## 2019-08-10 DIAGNOSIS — M24542 Contracture, left hand: Secondary | ICD-10-CM | POA: Diagnosis not present

## 2019-08-10 DIAGNOSIS — R1312 Dysphagia, oropharyngeal phase: Secondary | ICD-10-CM | POA: Diagnosis not present

## 2019-08-10 DIAGNOSIS — G40909 Epilepsy, unspecified, not intractable, without status epilepticus: Secondary | ICD-10-CM | POA: Diagnosis not present

## 2019-08-10 DIAGNOSIS — D631 Anemia in chronic kidney disease: Secondary | ICD-10-CM | POA: Diagnosis not present

## 2019-08-10 DIAGNOSIS — Z89612 Acquired absence of left leg above knee: Secondary | ICD-10-CM | POA: Diagnosis not present

## 2019-08-10 DIAGNOSIS — Z418 Encounter for other procedures for purposes other than remedying health state: Secondary | ICD-10-CM | POA: Diagnosis not present

## 2019-08-10 DIAGNOSIS — T8249XA Other complication of vascular dialysis catheter, initial encounter: Secondary | ICD-10-CM | POA: Diagnosis not present

## 2019-08-10 DIAGNOSIS — N2581 Secondary hyperparathyroidism of renal origin: Secondary | ICD-10-CM | POA: Diagnosis not present

## 2019-08-11 DIAGNOSIS — M24542 Contracture, left hand: Secondary | ICD-10-CM | POA: Diagnosis not present

## 2019-08-11 DIAGNOSIS — M24522 Contracture, left elbow: Secondary | ICD-10-CM | POA: Diagnosis not present

## 2019-08-11 DIAGNOSIS — Z8673 Personal history of transient ischemic attack (TIA), and cerebral infarction without residual deficits: Secondary | ICD-10-CM | POA: Diagnosis not present

## 2019-08-11 DIAGNOSIS — R1312 Dysphagia, oropharyngeal phase: Secondary | ICD-10-CM | POA: Diagnosis not present

## 2019-08-11 DIAGNOSIS — Z89612 Acquired absence of left leg above knee: Secondary | ICD-10-CM | POA: Diagnosis not present

## 2019-08-11 DIAGNOSIS — I69954 Hemiplegia and hemiparesis following unspecified cerebrovascular disease affecting left non-dominant side: Secondary | ICD-10-CM | POA: Diagnosis not present

## 2019-08-12 DIAGNOSIS — E1121 Type 2 diabetes mellitus with diabetic nephropathy: Secondary | ICD-10-CM | POA: Diagnosis not present

## 2019-08-12 DIAGNOSIS — D509 Iron deficiency anemia, unspecified: Secondary | ICD-10-CM | POA: Diagnosis not present

## 2019-08-12 DIAGNOSIS — T8249XA Other complication of vascular dialysis catheter, initial encounter: Secondary | ICD-10-CM | POA: Diagnosis not present

## 2019-08-12 DIAGNOSIS — M24522 Contracture, left elbow: Secondary | ICD-10-CM | POA: Diagnosis not present

## 2019-08-12 DIAGNOSIS — Z89612 Acquired absence of left leg above knee: Secondary | ICD-10-CM | POA: Diagnosis not present

## 2019-08-12 DIAGNOSIS — I69954 Hemiplegia and hemiparesis following unspecified cerebrovascular disease affecting left non-dominant side: Secondary | ICD-10-CM | POA: Diagnosis not present

## 2019-08-12 DIAGNOSIS — R1312 Dysphagia, oropharyngeal phase: Secondary | ICD-10-CM | POA: Diagnosis not present

## 2019-08-12 DIAGNOSIS — Z418 Encounter for other procedures for purposes other than remedying health state: Secondary | ICD-10-CM | POA: Diagnosis not present

## 2019-08-12 DIAGNOSIS — N186 End stage renal disease: Secondary | ICD-10-CM | POA: Diagnosis not present

## 2019-08-12 DIAGNOSIS — N2581 Secondary hyperparathyroidism of renal origin: Secondary | ICD-10-CM | POA: Diagnosis not present

## 2019-08-12 DIAGNOSIS — D631 Anemia in chronic kidney disease: Secondary | ICD-10-CM | POA: Diagnosis not present

## 2019-08-12 DIAGNOSIS — M24542 Contracture, left hand: Secondary | ICD-10-CM | POA: Diagnosis not present

## 2019-08-12 DIAGNOSIS — Z8673 Personal history of transient ischemic attack (TIA), and cerebral infarction without residual deficits: Secondary | ICD-10-CM | POA: Diagnosis not present

## 2019-08-13 DIAGNOSIS — I69954 Hemiplegia and hemiparesis following unspecified cerebrovascular disease affecting left non-dominant side: Secondary | ICD-10-CM | POA: Diagnosis not present

## 2019-08-13 DIAGNOSIS — Z8673 Personal history of transient ischemic attack (TIA), and cerebral infarction without residual deficits: Secondary | ICD-10-CM | POA: Diagnosis not present

## 2019-08-13 DIAGNOSIS — M24542 Contracture, left hand: Secondary | ICD-10-CM | POA: Diagnosis not present

## 2019-08-13 DIAGNOSIS — Z89612 Acquired absence of left leg above knee: Secondary | ICD-10-CM | POA: Diagnosis not present

## 2019-08-13 DIAGNOSIS — R1312 Dysphagia, oropharyngeal phase: Secondary | ICD-10-CM | POA: Diagnosis not present

## 2019-08-13 DIAGNOSIS — M24522 Contracture, left elbow: Secondary | ICD-10-CM | POA: Diagnosis not present

## 2019-08-14 DIAGNOSIS — D631 Anemia in chronic kidney disease: Secondary | ICD-10-CM | POA: Diagnosis not present

## 2019-08-14 DIAGNOSIS — N2581 Secondary hyperparathyroidism of renal origin: Secondary | ICD-10-CM | POA: Diagnosis not present

## 2019-08-14 DIAGNOSIS — N186 End stage renal disease: Secondary | ICD-10-CM | POA: Diagnosis not present

## 2019-08-14 DIAGNOSIS — D509 Iron deficiency anemia, unspecified: Secondary | ICD-10-CM | POA: Diagnosis not present

## 2019-08-14 DIAGNOSIS — Z418 Encounter for other procedures for purposes other than remedying health state: Secondary | ICD-10-CM | POA: Diagnosis not present

## 2019-08-14 DIAGNOSIS — T8249XA Other complication of vascular dialysis catheter, initial encounter: Secondary | ICD-10-CM | POA: Diagnosis not present

## 2019-08-15 DIAGNOSIS — Z89612 Acquired absence of left leg above knee: Secondary | ICD-10-CM | POA: Diagnosis not present

## 2019-08-15 DIAGNOSIS — Z8673 Personal history of transient ischemic attack (TIA), and cerebral infarction without residual deficits: Secondary | ICD-10-CM | POA: Diagnosis not present

## 2019-08-15 DIAGNOSIS — M24522 Contracture, left elbow: Secondary | ICD-10-CM | POA: Diagnosis not present

## 2019-08-15 DIAGNOSIS — R1312 Dysphagia, oropharyngeal phase: Secondary | ICD-10-CM | POA: Diagnosis not present

## 2019-08-15 DIAGNOSIS — I69954 Hemiplegia and hemiparesis following unspecified cerebrovascular disease affecting left non-dominant side: Secondary | ICD-10-CM | POA: Diagnosis not present

## 2019-08-15 DIAGNOSIS — M24542 Contracture, left hand: Secondary | ICD-10-CM | POA: Diagnosis not present

## 2019-08-17 DIAGNOSIS — T8249XA Other complication of vascular dialysis catheter, initial encounter: Secondary | ICD-10-CM | POA: Diagnosis not present

## 2019-08-17 DIAGNOSIS — D631 Anemia in chronic kidney disease: Secondary | ICD-10-CM | POA: Diagnosis not present

## 2019-08-17 DIAGNOSIS — N2581 Secondary hyperparathyroidism of renal origin: Secondary | ICD-10-CM | POA: Diagnosis not present

## 2019-08-17 DIAGNOSIS — M24542 Contracture, left hand: Secondary | ICD-10-CM | POA: Diagnosis not present

## 2019-08-17 DIAGNOSIS — Z418 Encounter for other procedures for purposes other than remedying health state: Secondary | ICD-10-CM | POA: Diagnosis not present

## 2019-08-17 DIAGNOSIS — M24522 Contracture, left elbow: Secondary | ICD-10-CM | POA: Diagnosis not present

## 2019-08-17 DIAGNOSIS — R1312 Dysphagia, oropharyngeal phase: Secondary | ICD-10-CM | POA: Diagnosis not present

## 2019-08-17 DIAGNOSIS — N186 End stage renal disease: Secondary | ICD-10-CM | POA: Diagnosis not present

## 2019-08-17 DIAGNOSIS — Z8673 Personal history of transient ischemic attack (TIA), and cerebral infarction without residual deficits: Secondary | ICD-10-CM | POA: Diagnosis not present

## 2019-08-17 DIAGNOSIS — I69954 Hemiplegia and hemiparesis following unspecified cerebrovascular disease affecting left non-dominant side: Secondary | ICD-10-CM | POA: Diagnosis not present

## 2019-08-17 DIAGNOSIS — D509 Iron deficiency anemia, unspecified: Secondary | ICD-10-CM | POA: Diagnosis not present

## 2019-08-17 DIAGNOSIS — Z89612 Acquired absence of left leg above knee: Secondary | ICD-10-CM | POA: Diagnosis not present

## 2019-08-18 DIAGNOSIS — M24522 Contracture, left elbow: Secondary | ICD-10-CM | POA: Diagnosis not present

## 2019-08-18 DIAGNOSIS — Z8673 Personal history of transient ischemic attack (TIA), and cerebral infarction without residual deficits: Secondary | ICD-10-CM | POA: Diagnosis not present

## 2019-08-18 DIAGNOSIS — R1312 Dysphagia, oropharyngeal phase: Secondary | ICD-10-CM | POA: Diagnosis not present

## 2019-08-18 DIAGNOSIS — I69954 Hemiplegia and hemiparesis following unspecified cerebrovascular disease affecting left non-dominant side: Secondary | ICD-10-CM | POA: Diagnosis not present

## 2019-08-18 DIAGNOSIS — M24542 Contracture, left hand: Secondary | ICD-10-CM | POA: Diagnosis not present

## 2019-08-18 DIAGNOSIS — Z89612 Acquired absence of left leg above knee: Secondary | ICD-10-CM | POA: Diagnosis not present

## 2019-08-19 DIAGNOSIS — Z418 Encounter for other procedures for purposes other than remedying health state: Secondary | ICD-10-CM | POA: Diagnosis not present

## 2019-08-19 DIAGNOSIS — T8249XA Other complication of vascular dialysis catheter, initial encounter: Secondary | ICD-10-CM | POA: Diagnosis not present

## 2019-08-19 DIAGNOSIS — N2581 Secondary hyperparathyroidism of renal origin: Secondary | ICD-10-CM | POA: Diagnosis not present

## 2019-08-19 DIAGNOSIS — Z8673 Personal history of transient ischemic attack (TIA), and cerebral infarction without residual deficits: Secondary | ICD-10-CM | POA: Diagnosis not present

## 2019-08-19 DIAGNOSIS — N186 End stage renal disease: Secondary | ICD-10-CM | POA: Diagnosis not present

## 2019-08-19 DIAGNOSIS — M24522 Contracture, left elbow: Secondary | ICD-10-CM | POA: Diagnosis not present

## 2019-08-19 DIAGNOSIS — D509 Iron deficiency anemia, unspecified: Secondary | ICD-10-CM | POA: Diagnosis not present

## 2019-08-19 DIAGNOSIS — I69954 Hemiplegia and hemiparesis following unspecified cerebrovascular disease affecting left non-dominant side: Secondary | ICD-10-CM | POA: Diagnosis not present

## 2019-08-19 DIAGNOSIS — M24542 Contracture, left hand: Secondary | ICD-10-CM | POA: Diagnosis not present

## 2019-08-19 DIAGNOSIS — R1312 Dysphagia, oropharyngeal phase: Secondary | ICD-10-CM | POA: Diagnosis not present

## 2019-08-19 DIAGNOSIS — D631 Anemia in chronic kidney disease: Secondary | ICD-10-CM | POA: Diagnosis not present

## 2019-08-19 DIAGNOSIS — Z89612 Acquired absence of left leg above knee: Secondary | ICD-10-CM | POA: Diagnosis not present

## 2019-08-20 DIAGNOSIS — I69954 Hemiplegia and hemiparesis following unspecified cerebrovascular disease affecting left non-dominant side: Secondary | ICD-10-CM | POA: Diagnosis not present

## 2019-08-20 DIAGNOSIS — Z89612 Acquired absence of left leg above knee: Secondary | ICD-10-CM | POA: Diagnosis not present

## 2019-08-20 DIAGNOSIS — I69354 Hemiplegia and hemiparesis following cerebral infarction affecting left non-dominant side: Secondary | ICD-10-CM | POA: Diagnosis not present

## 2019-08-20 DIAGNOSIS — I5022 Chronic systolic (congestive) heart failure: Secondary | ICD-10-CM | POA: Diagnosis not present

## 2019-08-20 DIAGNOSIS — M24542 Contracture, left hand: Secondary | ICD-10-CM | POA: Diagnosis not present

## 2019-08-20 DIAGNOSIS — M24522 Contracture, left elbow: Secondary | ICD-10-CM | POA: Diagnosis not present

## 2019-08-20 DIAGNOSIS — Z8673 Personal history of transient ischemic attack (TIA), and cerebral infarction without residual deficits: Secondary | ICD-10-CM | POA: Diagnosis not present

## 2019-08-20 DIAGNOSIS — N186 End stage renal disease: Secondary | ICD-10-CM | POA: Diagnosis not present

## 2019-08-20 DIAGNOSIS — I10 Essential (primary) hypertension: Secondary | ICD-10-CM | POA: Diagnosis not present

## 2019-08-20 DIAGNOSIS — I739 Peripheral vascular disease, unspecified: Secondary | ICD-10-CM | POA: Diagnosis not present

## 2019-08-20 DIAGNOSIS — E1122 Type 2 diabetes mellitus with diabetic chronic kidney disease: Secondary | ICD-10-CM | POA: Diagnosis not present

## 2019-08-20 DIAGNOSIS — M150 Primary generalized (osteo)arthritis: Secondary | ICD-10-CM | POA: Diagnosis not present

## 2019-08-20 DIAGNOSIS — R1312 Dysphagia, oropharyngeal phase: Secondary | ICD-10-CM | POA: Diagnosis not present

## 2019-08-20 DIAGNOSIS — M109 Gout, unspecified: Secondary | ICD-10-CM | POA: Diagnosis not present

## 2019-08-21 DIAGNOSIS — R1312 Dysphagia, oropharyngeal phase: Secondary | ICD-10-CM | POA: Diagnosis not present

## 2019-08-21 DIAGNOSIS — Z418 Encounter for other procedures for purposes other than remedying health state: Secondary | ICD-10-CM | POA: Diagnosis not present

## 2019-08-21 DIAGNOSIS — M24522 Contracture, left elbow: Secondary | ICD-10-CM | POA: Diagnosis not present

## 2019-08-21 DIAGNOSIS — D631 Anemia in chronic kidney disease: Secondary | ICD-10-CM | POA: Diagnosis not present

## 2019-08-21 DIAGNOSIS — D509 Iron deficiency anemia, unspecified: Secondary | ICD-10-CM | POA: Diagnosis not present

## 2019-08-21 DIAGNOSIS — N186 End stage renal disease: Secondary | ICD-10-CM | POA: Diagnosis not present

## 2019-08-21 DIAGNOSIS — T8249XA Other complication of vascular dialysis catheter, initial encounter: Secondary | ICD-10-CM | POA: Diagnosis not present

## 2019-08-21 DIAGNOSIS — N2581 Secondary hyperparathyroidism of renal origin: Secondary | ICD-10-CM | POA: Diagnosis not present

## 2019-08-21 DIAGNOSIS — I69954 Hemiplegia and hemiparesis following unspecified cerebrovascular disease affecting left non-dominant side: Secondary | ICD-10-CM | POA: Diagnosis not present

## 2019-08-21 DIAGNOSIS — Z8673 Personal history of transient ischemic attack (TIA), and cerebral infarction without residual deficits: Secondary | ICD-10-CM | POA: Diagnosis not present

## 2019-08-21 DIAGNOSIS — Z89612 Acquired absence of left leg above knee: Secondary | ICD-10-CM | POA: Diagnosis not present

## 2019-08-21 DIAGNOSIS — M24542 Contracture, left hand: Secondary | ICD-10-CM | POA: Diagnosis not present

## 2019-08-24 DIAGNOSIS — Z89612 Acquired absence of left leg above knee: Secondary | ICD-10-CM | POA: Diagnosis not present

## 2019-08-24 DIAGNOSIS — M24542 Contracture, left hand: Secondary | ICD-10-CM | POA: Diagnosis not present

## 2019-08-24 DIAGNOSIS — D631 Anemia in chronic kidney disease: Secondary | ICD-10-CM | POA: Diagnosis not present

## 2019-08-24 DIAGNOSIS — Z418 Encounter for other procedures for purposes other than remedying health state: Secondary | ICD-10-CM | POA: Diagnosis not present

## 2019-08-24 DIAGNOSIS — T8249XA Other complication of vascular dialysis catheter, initial encounter: Secondary | ICD-10-CM | POA: Diagnosis not present

## 2019-08-24 DIAGNOSIS — I69954 Hemiplegia and hemiparesis following unspecified cerebrovascular disease affecting left non-dominant side: Secondary | ICD-10-CM | POA: Diagnosis not present

## 2019-08-24 DIAGNOSIS — M24522 Contracture, left elbow: Secondary | ICD-10-CM | POA: Diagnosis not present

## 2019-08-24 DIAGNOSIS — R1312 Dysphagia, oropharyngeal phase: Secondary | ICD-10-CM | POA: Diagnosis not present

## 2019-08-24 DIAGNOSIS — Z8673 Personal history of transient ischemic attack (TIA), and cerebral infarction without residual deficits: Secondary | ICD-10-CM | POA: Diagnosis not present

## 2019-08-24 DIAGNOSIS — N2581 Secondary hyperparathyroidism of renal origin: Secondary | ICD-10-CM | POA: Diagnosis not present

## 2019-08-24 DIAGNOSIS — N186 End stage renal disease: Secondary | ICD-10-CM | POA: Diagnosis not present

## 2019-08-24 DIAGNOSIS — D509 Iron deficiency anemia, unspecified: Secondary | ICD-10-CM | POA: Diagnosis not present

## 2019-08-25 DIAGNOSIS — M24522 Contracture, left elbow: Secondary | ICD-10-CM | POA: Diagnosis not present

## 2019-08-25 DIAGNOSIS — Z89612 Acquired absence of left leg above knee: Secondary | ICD-10-CM | POA: Diagnosis not present

## 2019-08-25 DIAGNOSIS — I69954 Hemiplegia and hemiparesis following unspecified cerebrovascular disease affecting left non-dominant side: Secondary | ICD-10-CM | POA: Diagnosis not present

## 2019-08-25 DIAGNOSIS — R1312 Dysphagia, oropharyngeal phase: Secondary | ICD-10-CM | POA: Diagnosis not present

## 2019-08-25 DIAGNOSIS — M24542 Contracture, left hand: Secondary | ICD-10-CM | POA: Diagnosis not present

## 2019-08-25 DIAGNOSIS — Z8673 Personal history of transient ischemic attack (TIA), and cerebral infarction without residual deficits: Secondary | ICD-10-CM | POA: Diagnosis not present

## 2019-08-26 DIAGNOSIS — Z418 Encounter for other procedures for purposes other than remedying health state: Secondary | ICD-10-CM | POA: Diagnosis not present

## 2019-08-26 DIAGNOSIS — N2581 Secondary hyperparathyroidism of renal origin: Secondary | ICD-10-CM | POA: Diagnosis not present

## 2019-08-26 DIAGNOSIS — D509 Iron deficiency anemia, unspecified: Secondary | ICD-10-CM | POA: Diagnosis not present

## 2019-08-26 DIAGNOSIS — T8249XA Other complication of vascular dialysis catheter, initial encounter: Secondary | ICD-10-CM | POA: Diagnosis not present

## 2019-08-26 DIAGNOSIS — D631 Anemia in chronic kidney disease: Secondary | ICD-10-CM | POA: Diagnosis not present

## 2019-08-26 DIAGNOSIS — N186 End stage renal disease: Secondary | ICD-10-CM | POA: Diagnosis not present

## 2019-08-28 DIAGNOSIS — Z418 Encounter for other procedures for purposes other than remedying health state: Secondary | ICD-10-CM | POA: Diagnosis not present

## 2019-08-28 DIAGNOSIS — D631 Anemia in chronic kidney disease: Secondary | ICD-10-CM | POA: Diagnosis not present

## 2019-08-28 DIAGNOSIS — N2581 Secondary hyperparathyroidism of renal origin: Secondary | ICD-10-CM | POA: Diagnosis not present

## 2019-08-28 DIAGNOSIS — T8249XA Other complication of vascular dialysis catheter, initial encounter: Secondary | ICD-10-CM | POA: Diagnosis not present

## 2019-08-28 DIAGNOSIS — N186 End stage renal disease: Secondary | ICD-10-CM | POA: Diagnosis not present

## 2019-08-28 DIAGNOSIS — D509 Iron deficiency anemia, unspecified: Secondary | ICD-10-CM | POA: Diagnosis not present

## 2019-08-31 DIAGNOSIS — Z418 Encounter for other procedures for purposes other than remedying health state: Secondary | ICD-10-CM | POA: Diagnosis not present

## 2019-08-31 DIAGNOSIS — D631 Anemia in chronic kidney disease: Secondary | ICD-10-CM | POA: Diagnosis not present

## 2019-08-31 DIAGNOSIS — T8249XA Other complication of vascular dialysis catheter, initial encounter: Secondary | ICD-10-CM | POA: Diagnosis not present

## 2019-08-31 DIAGNOSIS — D509 Iron deficiency anemia, unspecified: Secondary | ICD-10-CM | POA: Diagnosis not present

## 2019-08-31 DIAGNOSIS — N186 End stage renal disease: Secondary | ICD-10-CM | POA: Diagnosis not present

## 2019-08-31 DIAGNOSIS — N2581 Secondary hyperparathyroidism of renal origin: Secondary | ICD-10-CM | POA: Diagnosis not present

## 2019-09-02 DIAGNOSIS — Z1159 Encounter for screening for other viral diseases: Secondary | ICD-10-CM | POA: Diagnosis not present

## 2019-09-02 DIAGNOSIS — T8249XA Other complication of vascular dialysis catheter, initial encounter: Secondary | ICD-10-CM | POA: Diagnosis not present

## 2019-09-02 DIAGNOSIS — Z418 Encounter for other procedures for purposes other than remedying health state: Secondary | ICD-10-CM | POA: Diagnosis not present

## 2019-09-02 DIAGNOSIS — N186 End stage renal disease: Secondary | ICD-10-CM | POA: Diagnosis not present

## 2019-09-02 DIAGNOSIS — D631 Anemia in chronic kidney disease: Secondary | ICD-10-CM | POA: Diagnosis not present

## 2019-09-02 DIAGNOSIS — N2581 Secondary hyperparathyroidism of renal origin: Secondary | ICD-10-CM | POA: Diagnosis not present

## 2019-09-02 DIAGNOSIS — D509 Iron deficiency anemia, unspecified: Secondary | ICD-10-CM | POA: Diagnosis not present

## 2019-09-03 DIAGNOSIS — I739 Peripheral vascular disease, unspecified: Secondary | ICD-10-CM | POA: Diagnosis not present

## 2019-09-03 DIAGNOSIS — E119 Type 2 diabetes mellitus without complications: Secondary | ICD-10-CM | POA: Diagnosis not present

## 2019-09-03 DIAGNOSIS — N186 End stage renal disease: Secondary | ICD-10-CM | POA: Diagnosis not present

## 2019-09-03 DIAGNOSIS — I69351 Hemiplegia and hemiparesis following cerebral infarction affecting right dominant side: Secondary | ICD-10-CM | POA: Diagnosis not present

## 2019-09-04 DIAGNOSIS — D631 Anemia in chronic kidney disease: Secondary | ICD-10-CM | POA: Diagnosis not present

## 2019-09-04 DIAGNOSIS — D509 Iron deficiency anemia, unspecified: Secondary | ICD-10-CM | POA: Diagnosis not present

## 2019-09-04 DIAGNOSIS — N2581 Secondary hyperparathyroidism of renal origin: Secondary | ICD-10-CM | POA: Diagnosis not present

## 2019-09-04 DIAGNOSIS — T8249XA Other complication of vascular dialysis catheter, initial encounter: Secondary | ICD-10-CM | POA: Diagnosis not present

## 2019-09-04 DIAGNOSIS — N186 End stage renal disease: Secondary | ICD-10-CM | POA: Diagnosis not present

## 2019-09-04 DIAGNOSIS — Z418 Encounter for other procedures for purposes other than remedying health state: Secondary | ICD-10-CM | POA: Diagnosis not present

## 2019-09-07 DIAGNOSIS — Z418 Encounter for other procedures for purposes other than remedying health state: Secondary | ICD-10-CM | POA: Diagnosis not present

## 2019-09-07 DIAGNOSIS — D509 Iron deficiency anemia, unspecified: Secondary | ICD-10-CM | POA: Diagnosis not present

## 2019-09-07 DIAGNOSIS — D631 Anemia in chronic kidney disease: Secondary | ICD-10-CM | POA: Diagnosis not present

## 2019-09-07 DIAGNOSIS — N2581 Secondary hyperparathyroidism of renal origin: Secondary | ICD-10-CM | POA: Diagnosis not present

## 2019-09-07 DIAGNOSIS — T8249XA Other complication of vascular dialysis catheter, initial encounter: Secondary | ICD-10-CM | POA: Diagnosis not present

## 2019-09-07 DIAGNOSIS — N186 End stage renal disease: Secondary | ICD-10-CM | POA: Diagnosis not present

## 2019-09-09 DIAGNOSIS — N186 End stage renal disease: Secondary | ICD-10-CM | POA: Diagnosis not present

## 2019-09-09 DIAGNOSIS — T8249XA Other complication of vascular dialysis catheter, initial encounter: Secondary | ICD-10-CM | POA: Diagnosis not present

## 2019-09-09 DIAGNOSIS — D509 Iron deficiency anemia, unspecified: Secondary | ICD-10-CM | POA: Diagnosis not present

## 2019-09-09 DIAGNOSIS — N2581 Secondary hyperparathyroidism of renal origin: Secondary | ICD-10-CM | POA: Diagnosis not present

## 2019-09-09 DIAGNOSIS — D631 Anemia in chronic kidney disease: Secondary | ICD-10-CM | POA: Diagnosis not present

## 2019-09-09 DIAGNOSIS — Z418 Encounter for other procedures for purposes other than remedying health state: Secondary | ICD-10-CM | POA: Diagnosis not present

## 2019-09-10 DIAGNOSIS — D631 Anemia in chronic kidney disease: Secondary | ICD-10-CM | POA: Diagnosis not present

## 2019-09-10 DIAGNOSIS — N186 End stage renal disease: Secondary | ICD-10-CM | POA: Diagnosis not present

## 2019-09-10 DIAGNOSIS — I10 Essential (primary) hypertension: Secondary | ICD-10-CM | POA: Diagnosis not present

## 2019-09-11 DIAGNOSIS — D509 Iron deficiency anemia, unspecified: Secondary | ICD-10-CM | POA: Diagnosis not present

## 2019-09-11 DIAGNOSIS — T8249XA Other complication of vascular dialysis catheter, initial encounter: Secondary | ICD-10-CM | POA: Diagnosis not present

## 2019-09-11 DIAGNOSIS — N186 End stage renal disease: Secondary | ICD-10-CM | POA: Diagnosis not present

## 2019-09-11 DIAGNOSIS — N2581 Secondary hyperparathyroidism of renal origin: Secondary | ICD-10-CM | POA: Diagnosis not present

## 2019-09-11 DIAGNOSIS — Z418 Encounter for other procedures for purposes other than remedying health state: Secondary | ICD-10-CM | POA: Diagnosis not present

## 2019-09-11 DIAGNOSIS — D631 Anemia in chronic kidney disease: Secondary | ICD-10-CM | POA: Diagnosis not present

## 2019-09-14 DIAGNOSIS — Z418 Encounter for other procedures for purposes other than remedying health state: Secondary | ICD-10-CM | POA: Diagnosis not present

## 2019-09-14 DIAGNOSIS — N186 End stage renal disease: Secondary | ICD-10-CM | POA: Diagnosis not present

## 2019-09-14 DIAGNOSIS — T8249XA Other complication of vascular dialysis catheter, initial encounter: Secondary | ICD-10-CM | POA: Diagnosis not present

## 2019-09-14 DIAGNOSIS — I5022 Chronic systolic (congestive) heart failure: Secondary | ICD-10-CM | POA: Diagnosis not present

## 2019-09-14 DIAGNOSIS — M150 Primary generalized (osteo)arthritis: Secondary | ICD-10-CM | POA: Diagnosis not present

## 2019-09-14 DIAGNOSIS — D631 Anemia in chronic kidney disease: Secondary | ICD-10-CM | POA: Diagnosis not present

## 2019-09-14 DIAGNOSIS — D509 Iron deficiency anemia, unspecified: Secondary | ICD-10-CM | POA: Diagnosis not present

## 2019-09-14 DIAGNOSIS — E1122 Type 2 diabetes mellitus with diabetic chronic kidney disease: Secondary | ICD-10-CM | POA: Diagnosis not present

## 2019-09-14 DIAGNOSIS — I10 Essential (primary) hypertension: Secondary | ICD-10-CM | POA: Diagnosis not present

## 2019-09-14 DIAGNOSIS — N2581 Secondary hyperparathyroidism of renal origin: Secondary | ICD-10-CM | POA: Diagnosis not present

## 2019-09-16 DIAGNOSIS — D509 Iron deficiency anemia, unspecified: Secondary | ICD-10-CM | POA: Diagnosis not present

## 2019-09-16 DIAGNOSIS — D631 Anemia in chronic kidney disease: Secondary | ICD-10-CM | POA: Diagnosis not present

## 2019-09-16 DIAGNOSIS — N2581 Secondary hyperparathyroidism of renal origin: Secondary | ICD-10-CM | POA: Diagnosis not present

## 2019-09-16 DIAGNOSIS — N186 End stage renal disease: Secondary | ICD-10-CM | POA: Diagnosis not present

## 2019-09-16 DIAGNOSIS — T8249XA Other complication of vascular dialysis catheter, initial encounter: Secondary | ICD-10-CM | POA: Diagnosis not present

## 2019-09-16 DIAGNOSIS — Z418 Encounter for other procedures for purposes other than remedying health state: Secondary | ICD-10-CM | POA: Diagnosis not present

## 2019-09-17 DIAGNOSIS — G40909 Epilepsy, unspecified, not intractable, without status epilepticus: Secondary | ICD-10-CM | POA: Diagnosis not present

## 2019-09-17 DIAGNOSIS — N186 End stage renal disease: Secondary | ICD-10-CM | POA: Diagnosis not present

## 2019-09-17 DIAGNOSIS — I5022 Chronic systolic (congestive) heart failure: Secondary | ICD-10-CM | POA: Diagnosis not present

## 2019-09-18 DIAGNOSIS — T8249XA Other complication of vascular dialysis catheter, initial encounter: Secondary | ICD-10-CM | POA: Diagnosis not present

## 2019-09-18 DIAGNOSIS — D631 Anemia in chronic kidney disease: Secondary | ICD-10-CM | POA: Diagnosis not present

## 2019-09-18 DIAGNOSIS — N186 End stage renal disease: Secondary | ICD-10-CM | POA: Diagnosis not present

## 2019-09-18 DIAGNOSIS — N2581 Secondary hyperparathyroidism of renal origin: Secondary | ICD-10-CM | POA: Diagnosis not present

## 2019-09-18 DIAGNOSIS — Z418 Encounter for other procedures for purposes other than remedying health state: Secondary | ICD-10-CM | POA: Diagnosis not present

## 2019-09-18 DIAGNOSIS — D509 Iron deficiency anemia, unspecified: Secondary | ICD-10-CM | POA: Diagnosis not present

## 2019-09-21 DIAGNOSIS — D631 Anemia in chronic kidney disease: Secondary | ICD-10-CM | POA: Diagnosis not present

## 2019-09-21 DIAGNOSIS — D509 Iron deficiency anemia, unspecified: Secondary | ICD-10-CM | POA: Diagnosis not present

## 2019-09-21 DIAGNOSIS — N2581 Secondary hyperparathyroidism of renal origin: Secondary | ICD-10-CM | POA: Diagnosis not present

## 2019-09-21 DIAGNOSIS — T8249XA Other complication of vascular dialysis catheter, initial encounter: Secondary | ICD-10-CM | POA: Diagnosis not present

## 2019-09-21 DIAGNOSIS — N186 End stage renal disease: Secondary | ICD-10-CM | POA: Diagnosis not present

## 2019-09-21 DIAGNOSIS — Z418 Encounter for other procedures for purposes other than remedying health state: Secondary | ICD-10-CM | POA: Diagnosis not present

## 2019-09-23 DIAGNOSIS — D631 Anemia in chronic kidney disease: Secondary | ICD-10-CM | POA: Diagnosis not present

## 2019-09-23 DIAGNOSIS — T8249XA Other complication of vascular dialysis catheter, initial encounter: Secondary | ICD-10-CM | POA: Diagnosis not present

## 2019-09-23 DIAGNOSIS — D509 Iron deficiency anemia, unspecified: Secondary | ICD-10-CM | POA: Diagnosis not present

## 2019-09-23 DIAGNOSIS — N186 End stage renal disease: Secondary | ICD-10-CM | POA: Diagnosis not present

## 2019-09-23 DIAGNOSIS — Z418 Encounter for other procedures for purposes other than remedying health state: Secondary | ICD-10-CM | POA: Diagnosis not present

## 2019-09-23 DIAGNOSIS — N2581 Secondary hyperparathyroidism of renal origin: Secondary | ICD-10-CM | POA: Diagnosis not present

## 2019-09-24 DIAGNOSIS — E559 Vitamin D deficiency, unspecified: Secondary | ICD-10-CM | POA: Diagnosis not present

## 2019-09-24 DIAGNOSIS — Z79899 Other long term (current) drug therapy: Secondary | ICD-10-CM | POA: Diagnosis not present

## 2019-09-24 DIAGNOSIS — E119 Type 2 diabetes mellitus without complications: Secondary | ICD-10-CM | POA: Diagnosis not present

## 2019-09-24 DIAGNOSIS — E039 Hypothyroidism, unspecified: Secondary | ICD-10-CM | POA: Diagnosis not present

## 2019-09-25 DIAGNOSIS — D631 Anemia in chronic kidney disease: Secondary | ICD-10-CM | POA: Diagnosis not present

## 2019-09-25 DIAGNOSIS — N186 End stage renal disease: Secondary | ICD-10-CM | POA: Diagnosis not present

## 2019-09-25 DIAGNOSIS — T8249XA Other complication of vascular dialysis catheter, initial encounter: Secondary | ICD-10-CM | POA: Diagnosis not present

## 2019-09-25 DIAGNOSIS — Z418 Encounter for other procedures for purposes other than remedying health state: Secondary | ICD-10-CM | POA: Diagnosis not present

## 2019-09-25 DIAGNOSIS — D509 Iron deficiency anemia, unspecified: Secondary | ICD-10-CM | POA: Diagnosis not present

## 2019-09-25 DIAGNOSIS — N2581 Secondary hyperparathyroidism of renal origin: Secondary | ICD-10-CM | POA: Diagnosis not present

## 2019-09-28 DIAGNOSIS — N186 End stage renal disease: Secondary | ICD-10-CM | POA: Diagnosis not present

## 2019-09-28 DIAGNOSIS — D509 Iron deficiency anemia, unspecified: Secondary | ICD-10-CM | POA: Diagnosis not present

## 2019-09-28 DIAGNOSIS — D631 Anemia in chronic kidney disease: Secondary | ICD-10-CM | POA: Diagnosis not present

## 2019-09-28 DIAGNOSIS — Z418 Encounter for other procedures for purposes other than remedying health state: Secondary | ICD-10-CM | POA: Diagnosis not present

## 2019-09-28 DIAGNOSIS — N2581 Secondary hyperparathyroidism of renal origin: Secondary | ICD-10-CM | POA: Diagnosis not present

## 2019-09-28 DIAGNOSIS — T8249XA Other complication of vascular dialysis catheter, initial encounter: Secondary | ICD-10-CM | POA: Diagnosis not present

## 2019-09-28 DIAGNOSIS — I5022 Chronic systolic (congestive) heart failure: Secondary | ICD-10-CM | POA: Diagnosis not present

## 2019-09-28 DIAGNOSIS — E119 Type 2 diabetes mellitus without complications: Secondary | ICD-10-CM | POA: Diagnosis not present

## 2019-09-28 DIAGNOSIS — I69351 Hemiplegia and hemiparesis following cerebral infarction affecting right dominant side: Secondary | ICD-10-CM | POA: Diagnosis not present

## 2019-09-30 DIAGNOSIS — Z418 Encounter for other procedures for purposes other than remedying health state: Secondary | ICD-10-CM | POA: Diagnosis not present

## 2019-09-30 DIAGNOSIS — N2581 Secondary hyperparathyroidism of renal origin: Secondary | ICD-10-CM | POA: Diagnosis not present

## 2019-09-30 DIAGNOSIS — N186 End stage renal disease: Secondary | ICD-10-CM | POA: Diagnosis not present

## 2019-09-30 DIAGNOSIS — D631 Anemia in chronic kidney disease: Secondary | ICD-10-CM | POA: Diagnosis not present

## 2019-09-30 DIAGNOSIS — D509 Iron deficiency anemia, unspecified: Secondary | ICD-10-CM | POA: Diagnosis not present

## 2019-09-30 DIAGNOSIS — T8249XA Other complication of vascular dialysis catheter, initial encounter: Secondary | ICD-10-CM | POA: Diagnosis not present

## 2019-10-02 DIAGNOSIS — Z418 Encounter for other procedures for purposes other than remedying health state: Secondary | ICD-10-CM | POA: Diagnosis not present

## 2019-10-02 DIAGNOSIS — D509 Iron deficiency anemia, unspecified: Secondary | ICD-10-CM | POA: Diagnosis not present

## 2019-10-02 DIAGNOSIS — D631 Anemia in chronic kidney disease: Secondary | ICD-10-CM | POA: Diagnosis not present

## 2019-10-02 DIAGNOSIS — N186 End stage renal disease: Secondary | ICD-10-CM | POA: Diagnosis not present

## 2019-10-02 DIAGNOSIS — T8249XA Other complication of vascular dialysis catheter, initial encounter: Secondary | ICD-10-CM | POA: Diagnosis not present

## 2019-10-02 DIAGNOSIS — N2581 Secondary hyperparathyroidism of renal origin: Secondary | ICD-10-CM | POA: Diagnosis not present

## 2019-10-05 DIAGNOSIS — N2581 Secondary hyperparathyroidism of renal origin: Secondary | ICD-10-CM | POA: Diagnosis not present

## 2019-10-05 DIAGNOSIS — D509 Iron deficiency anemia, unspecified: Secondary | ICD-10-CM | POA: Diagnosis not present

## 2019-10-05 DIAGNOSIS — N186 End stage renal disease: Secondary | ICD-10-CM | POA: Diagnosis not present

## 2019-10-05 DIAGNOSIS — D631 Anemia in chronic kidney disease: Secondary | ICD-10-CM | POA: Diagnosis not present

## 2019-10-05 DIAGNOSIS — T8249XA Other complication of vascular dialysis catheter, initial encounter: Secondary | ICD-10-CM | POA: Diagnosis not present

## 2019-10-05 DIAGNOSIS — Z418 Encounter for other procedures for purposes other than remedying health state: Secondary | ICD-10-CM | POA: Diagnosis not present

## 2019-10-07 DIAGNOSIS — K5900 Constipation, unspecified: Secondary | ICD-10-CM | POA: Diagnosis not present

## 2019-10-07 DIAGNOSIS — M24522 Contracture, left elbow: Secondary | ICD-10-CM | POA: Diagnosis not present

## 2019-10-07 DIAGNOSIS — R1312 Dysphagia, oropharyngeal phase: Secondary | ICD-10-CM | POA: Diagnosis not present

## 2019-10-07 DIAGNOSIS — D509 Iron deficiency anemia, unspecified: Secondary | ICD-10-CM | POA: Diagnosis not present

## 2019-10-07 DIAGNOSIS — F339 Major depressive disorder, recurrent, unspecified: Secondary | ICD-10-CM | POA: Diagnosis not present

## 2019-10-07 DIAGNOSIS — N186 End stage renal disease: Secondary | ICD-10-CM | POA: Diagnosis not present

## 2019-10-07 DIAGNOSIS — N2581 Secondary hyperparathyroidism of renal origin: Secondary | ICD-10-CM | POA: Diagnosis not present

## 2019-10-07 DIAGNOSIS — E1169 Type 2 diabetes mellitus with other specified complication: Secondary | ICD-10-CM | POA: Diagnosis not present

## 2019-10-07 DIAGNOSIS — I10 Essential (primary) hypertension: Secondary | ICD-10-CM | POA: Diagnosis not present

## 2019-10-07 DIAGNOSIS — I5022 Chronic systolic (congestive) heart failure: Secondary | ICD-10-CM | POA: Diagnosis not present

## 2019-10-07 DIAGNOSIS — I482 Chronic atrial fibrillation, unspecified: Secondary | ICD-10-CM | POA: Diagnosis not present

## 2019-10-07 DIAGNOSIS — Z418 Encounter for other procedures for purposes other than remedying health state: Secondary | ICD-10-CM | POA: Diagnosis not present

## 2019-10-07 DIAGNOSIS — E785 Hyperlipidemia, unspecified: Secondary | ICD-10-CM | POA: Diagnosis not present

## 2019-10-07 DIAGNOSIS — G40909 Epilepsy, unspecified, not intractable, without status epilepticus: Secondary | ICD-10-CM | POA: Diagnosis not present

## 2019-10-07 DIAGNOSIS — I639 Cerebral infarction, unspecified: Secondary | ICD-10-CM | POA: Diagnosis not present

## 2019-10-07 DIAGNOSIS — I69954 Hemiplegia and hemiparesis following unspecified cerebrovascular disease affecting left non-dominant side: Secondary | ICD-10-CM | POA: Diagnosis not present

## 2019-10-07 DIAGNOSIS — E559 Vitamin D deficiency, unspecified: Secondary | ICD-10-CM | POA: Diagnosis not present

## 2019-10-07 DIAGNOSIS — Z8673 Personal history of transient ischemic attack (TIA), and cerebral infarction without residual deficits: Secondary | ICD-10-CM | POA: Diagnosis not present

## 2019-10-07 DIAGNOSIS — Z89511 Acquired absence of right leg below knee: Secondary | ICD-10-CM | POA: Diagnosis not present

## 2019-10-07 DIAGNOSIS — H548 Legal blindness, as defined in USA: Secondary | ICD-10-CM | POA: Diagnosis not present

## 2019-10-07 DIAGNOSIS — I429 Cardiomyopathy, unspecified: Secondary | ICD-10-CM | POA: Diagnosis not present

## 2019-10-07 DIAGNOSIS — Z89612 Acquired absence of left leg above knee: Secondary | ICD-10-CM | POA: Diagnosis not present

## 2019-10-07 DIAGNOSIS — Z992 Dependence on renal dialysis: Secondary | ICD-10-CM | POA: Diagnosis not present

## 2019-10-07 DIAGNOSIS — T8249XA Other complication of vascular dialysis catheter, initial encounter: Secondary | ICD-10-CM | POA: Diagnosis not present

## 2019-10-07 DIAGNOSIS — E1122 Type 2 diabetes mellitus with diabetic chronic kidney disease: Secondary | ICD-10-CM | POA: Diagnosis not present

## 2019-10-07 DIAGNOSIS — M24542 Contracture, left hand: Secondary | ICD-10-CM | POA: Diagnosis not present

## 2019-10-07 DIAGNOSIS — I739 Peripheral vascular disease, unspecified: Secondary | ICD-10-CM | POA: Diagnosis not present

## 2019-10-07 DIAGNOSIS — D631 Anemia in chronic kidney disease: Secondary | ICD-10-CM | POA: Diagnosis not present

## 2019-10-08 DIAGNOSIS — I69954 Hemiplegia and hemiparesis following unspecified cerebrovascular disease affecting left non-dominant side: Secondary | ICD-10-CM | POA: Diagnosis not present

## 2019-10-08 DIAGNOSIS — Z89612 Acquired absence of left leg above knee: Secondary | ICD-10-CM | POA: Diagnosis not present

## 2019-10-08 DIAGNOSIS — M24542 Contracture, left hand: Secondary | ICD-10-CM | POA: Diagnosis not present

## 2019-10-08 DIAGNOSIS — Z8673 Personal history of transient ischemic attack (TIA), and cerebral infarction without residual deficits: Secondary | ICD-10-CM | POA: Diagnosis not present

## 2019-10-08 DIAGNOSIS — M24522 Contracture, left elbow: Secondary | ICD-10-CM | POA: Diagnosis not present

## 2019-10-08 DIAGNOSIS — R1312 Dysphagia, oropharyngeal phase: Secondary | ICD-10-CM | POA: Diagnosis not present

## 2019-10-09 DIAGNOSIS — Z418 Encounter for other procedures for purposes other than remedying health state: Secondary | ICD-10-CM | POA: Diagnosis not present

## 2019-10-09 DIAGNOSIS — N186 End stage renal disease: Secondary | ICD-10-CM | POA: Diagnosis not present

## 2019-10-09 DIAGNOSIS — T8249XA Other complication of vascular dialysis catheter, initial encounter: Secondary | ICD-10-CM | POA: Diagnosis not present

## 2019-10-09 DIAGNOSIS — D631 Anemia in chronic kidney disease: Secondary | ICD-10-CM | POA: Diagnosis not present

## 2019-10-09 DIAGNOSIS — N2581 Secondary hyperparathyroidism of renal origin: Secondary | ICD-10-CM | POA: Diagnosis not present

## 2019-10-09 DIAGNOSIS — D509 Iron deficiency anemia, unspecified: Secondary | ICD-10-CM | POA: Diagnosis not present

## 2019-10-10 DIAGNOSIS — D631 Anemia in chronic kidney disease: Secondary | ICD-10-CM | POA: Diagnosis not present

## 2019-10-10 DIAGNOSIS — N186 End stage renal disease: Secondary | ICD-10-CM | POA: Diagnosis not present

## 2019-10-10 DIAGNOSIS — I10 Essential (primary) hypertension: Secondary | ICD-10-CM | POA: Diagnosis not present

## 2019-10-12 DIAGNOSIS — I10 Essential (primary) hypertension: Secondary | ICD-10-CM | POA: Diagnosis not present

## 2019-10-12 DIAGNOSIS — R1312 Dysphagia, oropharyngeal phase: Secondary | ICD-10-CM | POA: Diagnosis not present

## 2019-10-12 DIAGNOSIS — I639 Cerebral infarction, unspecified: Secondary | ICD-10-CM | POA: Diagnosis not present

## 2019-10-12 DIAGNOSIS — D509 Iron deficiency anemia, unspecified: Secondary | ICD-10-CM | POA: Diagnosis not present

## 2019-10-12 DIAGNOSIS — D631 Anemia in chronic kidney disease: Secondary | ICD-10-CM | POA: Diagnosis not present

## 2019-10-12 DIAGNOSIS — M24542 Contracture, left hand: Secondary | ICD-10-CM | POA: Diagnosis not present

## 2019-10-12 DIAGNOSIS — Z8673 Personal history of transient ischemic attack (TIA), and cerebral infarction without residual deficits: Secondary | ICD-10-CM | POA: Diagnosis not present

## 2019-10-12 DIAGNOSIS — I5022 Chronic systolic (congestive) heart failure: Secondary | ICD-10-CM | POA: Diagnosis not present

## 2019-10-12 DIAGNOSIS — G40909 Epilepsy, unspecified, not intractable, without status epilepticus: Secondary | ICD-10-CM | POA: Diagnosis not present

## 2019-10-12 DIAGNOSIS — I739 Peripheral vascular disease, unspecified: Secondary | ICD-10-CM | POA: Diagnosis not present

## 2019-10-12 DIAGNOSIS — E785 Hyperlipidemia, unspecified: Secondary | ICD-10-CM | POA: Diagnosis not present

## 2019-10-12 DIAGNOSIS — E1169 Type 2 diabetes mellitus with other specified complication: Secondary | ICD-10-CM | POA: Diagnosis not present

## 2019-10-12 DIAGNOSIS — I482 Chronic atrial fibrillation, unspecified: Secondary | ICD-10-CM | POA: Diagnosis not present

## 2019-10-12 DIAGNOSIS — M24522 Contracture, left elbow: Secondary | ICD-10-CM | POA: Diagnosis not present

## 2019-10-12 DIAGNOSIS — N2581 Secondary hyperparathyroidism of renal origin: Secondary | ICD-10-CM | POA: Diagnosis not present

## 2019-10-12 DIAGNOSIS — Z89511 Acquired absence of right leg below knee: Secondary | ICD-10-CM | POA: Diagnosis not present

## 2019-10-12 DIAGNOSIS — F339 Major depressive disorder, recurrent, unspecified: Secondary | ICD-10-CM | POA: Diagnosis not present

## 2019-10-12 DIAGNOSIS — Z992 Dependence on renal dialysis: Secondary | ICD-10-CM | POA: Diagnosis not present

## 2019-10-12 DIAGNOSIS — I429 Cardiomyopathy, unspecified: Secondary | ICD-10-CM | POA: Diagnosis not present

## 2019-10-12 DIAGNOSIS — H548 Legal blindness, as defined in USA: Secondary | ICD-10-CM | POA: Diagnosis not present

## 2019-10-12 DIAGNOSIS — I69954 Hemiplegia and hemiparesis following unspecified cerebrovascular disease affecting left non-dominant side: Secondary | ICD-10-CM | POA: Diagnosis not present

## 2019-10-12 DIAGNOSIS — N186 End stage renal disease: Secondary | ICD-10-CM | POA: Diagnosis not present

## 2019-10-12 DIAGNOSIS — K5900 Constipation, unspecified: Secondary | ICD-10-CM | POA: Diagnosis not present

## 2019-10-12 DIAGNOSIS — A419 Sepsis, unspecified organism: Secondary | ICD-10-CM | POA: Diagnosis not present

## 2019-10-12 DIAGNOSIS — E559 Vitamin D deficiency, unspecified: Secondary | ICD-10-CM | POA: Diagnosis not present

## 2019-10-12 DIAGNOSIS — Z89612 Acquired absence of left leg above knee: Secondary | ICD-10-CM | POA: Diagnosis not present

## 2019-10-12 DIAGNOSIS — T8249XA Other complication of vascular dialysis catheter, initial encounter: Secondary | ICD-10-CM | POA: Diagnosis not present

## 2019-10-12 DIAGNOSIS — E1122 Type 2 diabetes mellitus with diabetic chronic kidney disease: Secondary | ICD-10-CM | POA: Diagnosis not present

## 2019-10-14 DIAGNOSIS — D509 Iron deficiency anemia, unspecified: Secondary | ICD-10-CM | POA: Diagnosis not present

## 2019-10-14 DIAGNOSIS — N186 End stage renal disease: Secondary | ICD-10-CM | POA: Diagnosis not present

## 2019-10-14 DIAGNOSIS — R1312 Dysphagia, oropharyngeal phase: Secondary | ICD-10-CM | POA: Diagnosis not present

## 2019-10-14 DIAGNOSIS — M24542 Contracture, left hand: Secondary | ICD-10-CM | POA: Diagnosis not present

## 2019-10-14 DIAGNOSIS — I69954 Hemiplegia and hemiparesis following unspecified cerebrovascular disease affecting left non-dominant side: Secondary | ICD-10-CM | POA: Diagnosis not present

## 2019-10-14 DIAGNOSIS — M24522 Contracture, left elbow: Secondary | ICD-10-CM | POA: Diagnosis not present

## 2019-10-14 DIAGNOSIS — N2581 Secondary hyperparathyroidism of renal origin: Secondary | ICD-10-CM | POA: Diagnosis not present

## 2019-10-14 DIAGNOSIS — A419 Sepsis, unspecified organism: Secondary | ICD-10-CM | POA: Diagnosis not present

## 2019-10-14 DIAGNOSIS — D631 Anemia in chronic kidney disease: Secondary | ICD-10-CM | POA: Diagnosis not present

## 2019-10-14 DIAGNOSIS — T8249XA Other complication of vascular dialysis catheter, initial encounter: Secondary | ICD-10-CM | POA: Diagnosis not present

## 2019-10-14 DIAGNOSIS — Z8673 Personal history of transient ischemic attack (TIA), and cerebral infarction without residual deficits: Secondary | ICD-10-CM | POA: Diagnosis not present

## 2019-10-14 DIAGNOSIS — Z89612 Acquired absence of left leg above knee: Secondary | ICD-10-CM | POA: Diagnosis not present

## 2019-10-15 DIAGNOSIS — I132 Hypertensive heart and chronic kidney disease with heart failure and with stage 5 chronic kidney disease, or end stage renal disease: Secondary | ICD-10-CM | POA: Diagnosis not present

## 2019-10-15 DIAGNOSIS — I69354 Hemiplegia and hemiparesis following cerebral infarction affecting left non-dominant side: Secondary | ICD-10-CM | POA: Diagnosis not present

## 2019-10-15 DIAGNOSIS — I5022 Chronic systolic (congestive) heart failure: Secondary | ICD-10-CM | POA: Diagnosis not present

## 2019-10-15 DIAGNOSIS — N186 End stage renal disease: Secondary | ICD-10-CM | POA: Diagnosis not present

## 2019-10-16 DIAGNOSIS — D631 Anemia in chronic kidney disease: Secondary | ICD-10-CM | POA: Diagnosis not present

## 2019-10-16 DIAGNOSIS — D509 Iron deficiency anemia, unspecified: Secondary | ICD-10-CM | POA: Diagnosis not present

## 2019-10-16 DIAGNOSIS — N186 End stage renal disease: Secondary | ICD-10-CM | POA: Diagnosis not present

## 2019-10-16 DIAGNOSIS — T8249XA Other complication of vascular dialysis catheter, initial encounter: Secondary | ICD-10-CM | POA: Diagnosis not present

## 2019-10-16 DIAGNOSIS — N2581 Secondary hyperparathyroidism of renal origin: Secondary | ICD-10-CM | POA: Diagnosis not present

## 2019-10-16 DIAGNOSIS — A419 Sepsis, unspecified organism: Secondary | ICD-10-CM | POA: Diagnosis not present

## 2019-10-19 DIAGNOSIS — N186 End stage renal disease: Secondary | ICD-10-CM | POA: Diagnosis not present

## 2019-10-19 DIAGNOSIS — M24542 Contracture, left hand: Secondary | ICD-10-CM | POA: Diagnosis not present

## 2019-10-19 DIAGNOSIS — R1312 Dysphagia, oropharyngeal phase: Secondary | ICD-10-CM | POA: Diagnosis not present

## 2019-10-19 DIAGNOSIS — N2581 Secondary hyperparathyroidism of renal origin: Secondary | ICD-10-CM | POA: Diagnosis not present

## 2019-10-19 DIAGNOSIS — D509 Iron deficiency anemia, unspecified: Secondary | ICD-10-CM | POA: Diagnosis not present

## 2019-10-19 DIAGNOSIS — Z89612 Acquired absence of left leg above knee: Secondary | ICD-10-CM | POA: Diagnosis not present

## 2019-10-19 DIAGNOSIS — M24522 Contracture, left elbow: Secondary | ICD-10-CM | POA: Diagnosis not present

## 2019-10-19 DIAGNOSIS — A419 Sepsis, unspecified organism: Secondary | ICD-10-CM | POA: Diagnosis not present

## 2019-10-19 DIAGNOSIS — T8249XA Other complication of vascular dialysis catheter, initial encounter: Secondary | ICD-10-CM | POA: Diagnosis not present

## 2019-10-19 DIAGNOSIS — I69954 Hemiplegia and hemiparesis following unspecified cerebrovascular disease affecting left non-dominant side: Secondary | ICD-10-CM | POA: Diagnosis not present

## 2019-10-19 DIAGNOSIS — Z8673 Personal history of transient ischemic attack (TIA), and cerebral infarction without residual deficits: Secondary | ICD-10-CM | POA: Diagnosis not present

## 2019-10-19 DIAGNOSIS — D631 Anemia in chronic kidney disease: Secondary | ICD-10-CM | POA: Diagnosis not present

## 2019-10-20 DIAGNOSIS — Z79899 Other long term (current) drug therapy: Secondary | ICD-10-CM | POA: Diagnosis not present

## 2019-10-20 DIAGNOSIS — N390 Urinary tract infection, site not specified: Secondary | ICD-10-CM | POA: Diagnosis not present

## 2019-10-20 DIAGNOSIS — M24542 Contracture, left hand: Secondary | ICD-10-CM | POA: Diagnosis not present

## 2019-10-20 DIAGNOSIS — Z8673 Personal history of transient ischemic attack (TIA), and cerebral infarction without residual deficits: Secondary | ICD-10-CM | POA: Diagnosis not present

## 2019-10-20 DIAGNOSIS — R1312 Dysphagia, oropharyngeal phase: Secondary | ICD-10-CM | POA: Diagnosis not present

## 2019-10-20 DIAGNOSIS — Z89612 Acquired absence of left leg above knee: Secondary | ICD-10-CM | POA: Diagnosis not present

## 2019-10-20 DIAGNOSIS — I69954 Hemiplegia and hemiparesis following unspecified cerebrovascular disease affecting left non-dominant side: Secondary | ICD-10-CM | POA: Diagnosis not present

## 2019-10-20 DIAGNOSIS — R0989 Other specified symptoms and signs involving the circulatory and respiratory systems: Secondary | ICD-10-CM | POA: Diagnosis not present

## 2019-10-20 DIAGNOSIS — R319 Hematuria, unspecified: Secondary | ICD-10-CM | POA: Diagnosis not present

## 2019-10-20 DIAGNOSIS — D649 Anemia, unspecified: Secondary | ICD-10-CM | POA: Diagnosis not present

## 2019-10-20 DIAGNOSIS — M24522 Contracture, left elbow: Secondary | ICD-10-CM | POA: Diagnosis not present

## 2019-10-21 DIAGNOSIS — D509 Iron deficiency anemia, unspecified: Secondary | ICD-10-CM | POA: Diagnosis not present

## 2019-10-21 DIAGNOSIS — Z89612 Acquired absence of left leg above knee: Secondary | ICD-10-CM | POA: Diagnosis not present

## 2019-10-21 DIAGNOSIS — M24522 Contracture, left elbow: Secondary | ICD-10-CM | POA: Diagnosis not present

## 2019-10-21 DIAGNOSIS — A419 Sepsis, unspecified organism: Secondary | ICD-10-CM | POA: Diagnosis not present

## 2019-10-21 DIAGNOSIS — M24542 Contracture, left hand: Secondary | ICD-10-CM | POA: Diagnosis not present

## 2019-10-21 DIAGNOSIS — R1312 Dysphagia, oropharyngeal phase: Secondary | ICD-10-CM | POA: Diagnosis not present

## 2019-10-21 DIAGNOSIS — T8249XA Other complication of vascular dialysis catheter, initial encounter: Secondary | ICD-10-CM | POA: Diagnosis not present

## 2019-10-21 DIAGNOSIS — Z8673 Personal history of transient ischemic attack (TIA), and cerebral infarction without residual deficits: Secondary | ICD-10-CM | POA: Diagnosis not present

## 2019-10-21 DIAGNOSIS — N2581 Secondary hyperparathyroidism of renal origin: Secondary | ICD-10-CM | POA: Diagnosis not present

## 2019-10-21 DIAGNOSIS — D631 Anemia in chronic kidney disease: Secondary | ICD-10-CM | POA: Diagnosis not present

## 2019-10-21 DIAGNOSIS — N186 End stage renal disease: Secondary | ICD-10-CM | POA: Diagnosis not present

## 2019-10-21 DIAGNOSIS — I69954 Hemiplegia and hemiparesis following unspecified cerebrovascular disease affecting left non-dominant side: Secondary | ICD-10-CM | POA: Diagnosis not present

## 2019-10-22 DIAGNOSIS — M24522 Contracture, left elbow: Secondary | ICD-10-CM | POA: Diagnosis not present

## 2019-10-22 DIAGNOSIS — M24542 Contracture, left hand: Secondary | ICD-10-CM | POA: Diagnosis not present

## 2019-10-22 DIAGNOSIS — Z89612 Acquired absence of left leg above knee: Secondary | ICD-10-CM | POA: Diagnosis not present

## 2019-10-22 DIAGNOSIS — R1312 Dysphagia, oropharyngeal phase: Secondary | ICD-10-CM | POA: Diagnosis not present

## 2019-10-22 DIAGNOSIS — A499 Bacterial infection, unspecified: Secondary | ICD-10-CM | POA: Diagnosis not present

## 2019-10-22 DIAGNOSIS — I69954 Hemiplegia and hemiparesis following unspecified cerebrovascular disease affecting left non-dominant side: Secondary | ICD-10-CM | POA: Diagnosis not present

## 2019-10-22 DIAGNOSIS — Z8673 Personal history of transient ischemic attack (TIA), and cerebral infarction without residual deficits: Secondary | ICD-10-CM | POA: Diagnosis not present

## 2019-10-22 DIAGNOSIS — N390 Urinary tract infection, site not specified: Secondary | ICD-10-CM | POA: Diagnosis not present

## 2019-10-23 DIAGNOSIS — D631 Anemia in chronic kidney disease: Secondary | ICD-10-CM | POA: Diagnosis not present

## 2019-10-23 DIAGNOSIS — I69954 Hemiplegia and hemiparesis following unspecified cerebrovascular disease affecting left non-dominant side: Secondary | ICD-10-CM | POA: Diagnosis not present

## 2019-10-23 DIAGNOSIS — Z8673 Personal history of transient ischemic attack (TIA), and cerebral infarction without residual deficits: Secondary | ICD-10-CM | POA: Diagnosis not present

## 2019-10-23 DIAGNOSIS — N2581 Secondary hyperparathyroidism of renal origin: Secondary | ICD-10-CM | POA: Diagnosis not present

## 2019-10-23 DIAGNOSIS — R1312 Dysphagia, oropharyngeal phase: Secondary | ICD-10-CM | POA: Diagnosis not present

## 2019-10-23 DIAGNOSIS — Z89612 Acquired absence of left leg above knee: Secondary | ICD-10-CM | POA: Diagnosis not present

## 2019-10-23 DIAGNOSIS — T8249XA Other complication of vascular dialysis catheter, initial encounter: Secondary | ICD-10-CM | POA: Diagnosis not present

## 2019-10-23 DIAGNOSIS — D509 Iron deficiency anemia, unspecified: Secondary | ICD-10-CM | POA: Diagnosis not present

## 2019-10-23 DIAGNOSIS — N186 End stage renal disease: Secondary | ICD-10-CM | POA: Diagnosis not present

## 2019-10-23 DIAGNOSIS — A419 Sepsis, unspecified organism: Secondary | ICD-10-CM | POA: Diagnosis not present

## 2019-10-23 DIAGNOSIS — M24522 Contracture, left elbow: Secondary | ICD-10-CM | POA: Diagnosis not present

## 2019-10-23 DIAGNOSIS — M24542 Contracture, left hand: Secondary | ICD-10-CM | POA: Diagnosis not present

## 2019-10-24 DIAGNOSIS — I10 Essential (primary) hypertension: Secondary | ICD-10-CM | POA: Diagnosis not present

## 2019-10-24 DIAGNOSIS — N186 End stage renal disease: Secondary | ICD-10-CM | POA: Diagnosis not present

## 2019-10-26 DIAGNOSIS — I429 Cardiomyopathy, unspecified: Secondary | ICD-10-CM | POA: Diagnosis present

## 2019-10-26 DIAGNOSIS — Z89512 Acquired absence of left leg below knee: Secondary | ICD-10-CM | POA: Diagnosis not present

## 2019-10-26 DIAGNOSIS — I959 Hypotension, unspecified: Secondary | ICD-10-CM | POA: Diagnosis not present

## 2019-10-26 DIAGNOSIS — R0989 Other specified symptoms and signs involving the circulatory and respiratory systems: Secondary | ICD-10-CM | POA: Diagnosis not present

## 2019-10-26 DIAGNOSIS — R0689 Other abnormalities of breathing: Secondary | ICD-10-CM | POA: Diagnosis not present

## 2019-10-26 DIAGNOSIS — Z20822 Contact with and (suspected) exposure to covid-19: Secondary | ICD-10-CM | POA: Diagnosis not present

## 2019-10-26 DIAGNOSIS — R1311 Dysphagia, oral phase: Secondary | ICD-10-CM | POA: Diagnosis present

## 2019-10-26 DIAGNOSIS — M24522 Contracture, left elbow: Secondary | ICD-10-CM | POA: Diagnosis not present

## 2019-10-26 DIAGNOSIS — Z89612 Acquired absence of left leg above knee: Secondary | ICD-10-CM | POA: Diagnosis not present

## 2019-10-26 DIAGNOSIS — R6521 Severe sepsis with septic shock: Secondary | ICD-10-CM | POA: Diagnosis not present

## 2019-10-26 DIAGNOSIS — M24542 Contracture, left hand: Secondary | ICD-10-CM | POA: Diagnosis not present

## 2019-10-26 DIAGNOSIS — G40909 Epilepsy, unspecified, not intractable, without status epilepticus: Secondary | ICD-10-CM | POA: Diagnosis not present

## 2019-10-26 DIAGNOSIS — R1310 Dysphagia, unspecified: Secondary | ICD-10-CM | POA: Diagnosis not present

## 2019-10-26 DIAGNOSIS — I4891 Unspecified atrial fibrillation: Secondary | ICD-10-CM | POA: Diagnosis present

## 2019-10-26 DIAGNOSIS — A419 Sepsis, unspecified organism: Secondary | ICD-10-CM | POA: Diagnosis not present

## 2019-10-26 DIAGNOSIS — T68XXXA Hypothermia, initial encounter: Secondary | ICD-10-CM | POA: Diagnosis not present

## 2019-10-26 DIAGNOSIS — J9811 Atelectasis: Secondary | ICD-10-CM | POA: Diagnosis not present

## 2019-10-26 DIAGNOSIS — I120 Hypertensive chronic kidney disease with stage 5 chronic kidney disease or end stage renal disease: Secondary | ICD-10-CM | POA: Diagnosis not present

## 2019-10-26 DIAGNOSIS — J9611 Chronic respiratory failure with hypoxia: Secondary | ICD-10-CM | POA: Diagnosis not present

## 2019-10-26 DIAGNOSIS — D631 Anemia in chronic kidney disease: Secondary | ICD-10-CM | POA: Diagnosis not present

## 2019-10-26 DIAGNOSIS — D509 Iron deficiency anemia, unspecified: Secondary | ICD-10-CM | POA: Diagnosis not present

## 2019-10-26 DIAGNOSIS — Z992 Dependence on renal dialysis: Secondary | ICD-10-CM | POA: Diagnosis not present

## 2019-10-26 DIAGNOSIS — N186 End stage renal disease: Secondary | ICD-10-CM | POA: Diagnosis not present

## 2019-10-26 DIAGNOSIS — E872 Acidosis: Secondary | ICD-10-CM | POA: Diagnosis not present

## 2019-10-26 DIAGNOSIS — R404 Transient alteration of awareness: Secondary | ICD-10-CM | POA: Diagnosis not present

## 2019-10-26 DIAGNOSIS — E785 Hyperlipidemia, unspecified: Secondary | ICD-10-CM | POA: Diagnosis not present

## 2019-10-26 DIAGNOSIS — I452 Bifascicular block: Secondary | ICD-10-CM | POA: Diagnosis present

## 2019-10-26 DIAGNOSIS — Z89511 Acquired absence of right leg below knee: Secondary | ICD-10-CM | POA: Diagnosis not present

## 2019-10-26 DIAGNOSIS — Z89611 Acquired absence of right leg above knee: Secondary | ICD-10-CM | POA: Diagnosis not present

## 2019-10-26 DIAGNOSIS — N2581 Secondary hyperparathyroidism of renal origin: Secondary | ICD-10-CM | POA: Diagnosis present

## 2019-10-26 DIAGNOSIS — L89151 Pressure ulcer of sacral region, stage 1: Secondary | ICD-10-CM | POA: Diagnosis present

## 2019-10-26 DIAGNOSIS — T8249XA Other complication of vascular dialysis catheter, initial encounter: Secondary | ICD-10-CM | POA: Diagnosis not present

## 2019-10-26 DIAGNOSIS — J9621 Acute and chronic respiratory failure with hypoxia: Secondary | ICD-10-CM | POA: Diagnosis present

## 2019-10-26 DIAGNOSIS — H16323 Diffuse interstitial keratitis, bilateral: Secondary | ICD-10-CM | POA: Diagnosis not present

## 2019-10-26 DIAGNOSIS — E559 Vitamin D deficiency, unspecified: Secondary | ICD-10-CM | POA: Diagnosis not present

## 2019-10-26 DIAGNOSIS — I132 Hypertensive heart and chronic kidney disease with heart failure and with stage 5 chronic kidney disease, or end stage renal disease: Secondary | ICD-10-CM | POA: Diagnosis present

## 2019-10-26 DIAGNOSIS — L89102 Pressure ulcer of unspecified part of back, stage 2: Secondary | ICD-10-CM | POA: Diagnosis present

## 2019-10-26 DIAGNOSIS — R4182 Altered mental status, unspecified: Secondary | ICD-10-CM | POA: Diagnosis not present

## 2019-10-26 DIAGNOSIS — R0902 Hypoxemia: Secondary | ICD-10-CM | POA: Diagnosis not present

## 2019-10-26 DIAGNOSIS — Z8673 Personal history of transient ischemic attack (TIA), and cerebral infarction without residual deficits: Secondary | ICD-10-CM | POA: Diagnosis not present

## 2019-10-26 DIAGNOSIS — M545 Low back pain: Secondary | ICD-10-CM | POA: Diagnosis not present

## 2019-10-26 DIAGNOSIS — I482 Chronic atrial fibrillation, unspecified: Secondary | ICD-10-CM | POA: Diagnosis not present

## 2019-10-26 DIAGNOSIS — I69954 Hemiplegia and hemiparesis following unspecified cerebrovascular disease affecting left non-dominant side: Secondary | ICD-10-CM | POA: Diagnosis not present

## 2019-10-26 DIAGNOSIS — E1152 Type 2 diabetes mellitus with diabetic peripheral angiopathy with gangrene: Secondary | ICD-10-CM | POA: Diagnosis present

## 2019-10-26 DIAGNOSIS — I10 Essential (primary) hypertension: Secondary | ICD-10-CM | POA: Diagnosis not present

## 2019-10-26 DIAGNOSIS — I639 Cerebral infarction, unspecified: Secondary | ICD-10-CM | POA: Diagnosis not present

## 2019-10-26 DIAGNOSIS — G8929 Other chronic pain: Secondary | ICD-10-CM | POA: Diagnosis present

## 2019-10-26 DIAGNOSIS — F339 Major depressive disorder, recurrent, unspecified: Secondary | ICD-10-CM | POA: Diagnosis not present

## 2019-10-26 DIAGNOSIS — N390 Urinary tract infection, site not specified: Secondary | ICD-10-CM | POA: Diagnosis present

## 2019-10-26 DIAGNOSIS — R509 Fever, unspecified: Secondary | ICD-10-CM | POA: Diagnosis not present

## 2019-10-26 DIAGNOSIS — K5900 Constipation, unspecified: Secondary | ICD-10-CM | POA: Diagnosis not present

## 2019-10-26 DIAGNOSIS — J449 Chronic obstructive pulmonary disease, unspecified: Secondary | ICD-10-CM | POA: Diagnosis present

## 2019-10-26 DIAGNOSIS — I2510 Atherosclerotic heart disease of native coronary artery without angina pectoris: Secondary | ICD-10-CM | POA: Diagnosis present

## 2019-10-26 DIAGNOSIS — G4733 Obstructive sleep apnea (adult) (pediatric): Secondary | ICD-10-CM | POA: Diagnosis present

## 2019-10-26 DIAGNOSIS — N3001 Acute cystitis with hematuria: Secondary | ICD-10-CM | POA: Diagnosis not present

## 2019-10-26 DIAGNOSIS — Z9981 Dependence on supplemental oxygen: Secondary | ICD-10-CM | POA: Diagnosis not present

## 2019-10-26 DIAGNOSIS — I739 Peripheral vascular disease, unspecified: Secondary | ICD-10-CM | POA: Diagnosis not present

## 2019-10-26 DIAGNOSIS — I5022 Chronic systolic (congestive) heart failure: Secondary | ICD-10-CM | POA: Diagnosis present

## 2019-10-26 DIAGNOSIS — E1122 Type 2 diabetes mellitus with diabetic chronic kidney disease: Secondary | ICD-10-CM | POA: Diagnosis not present

## 2019-10-26 DIAGNOSIS — R1312 Dysphagia, oropharyngeal phase: Secondary | ICD-10-CM | POA: Diagnosis not present

## 2019-10-26 DIAGNOSIS — E876 Hypokalemia: Secondary | ICD-10-CM | POA: Diagnosis present

## 2019-10-26 DIAGNOSIS — H548 Legal blindness, as defined in USA: Secondary | ICD-10-CM | POA: Diagnosis not present

## 2019-10-28 DIAGNOSIS — I4891 Unspecified atrial fibrillation: Secondary | ICD-10-CM | POA: Diagnosis present

## 2019-10-28 DIAGNOSIS — B9620 Unspecified Escherichia coli [E. coli] as the cause of diseases classified elsewhere: Secondary | ICD-10-CM | POA: Diagnosis not present

## 2019-10-28 DIAGNOSIS — R7881 Bacteremia: Secondary | ICD-10-CM | POA: Diagnosis not present

## 2019-10-28 DIAGNOSIS — R404 Transient alteration of awareness: Secondary | ICD-10-CM | POA: Diagnosis not present

## 2019-10-28 DIAGNOSIS — J449 Chronic obstructive pulmonary disease, unspecified: Secondary | ICD-10-CM | POA: Diagnosis present

## 2019-10-28 DIAGNOSIS — Z89512 Acquired absence of left leg below knee: Secondary | ICD-10-CM | POA: Diagnosis not present

## 2019-10-28 DIAGNOSIS — E1152 Type 2 diabetes mellitus with diabetic peripheral angiopathy with gangrene: Secondary | ICD-10-CM | POA: Diagnosis present

## 2019-10-28 DIAGNOSIS — Z7401 Bed confinement status: Secondary | ICD-10-CM | POA: Diagnosis not present

## 2019-10-28 DIAGNOSIS — E1122 Type 2 diabetes mellitus with diabetic chronic kidney disease: Secondary | ICD-10-CM | POA: Diagnosis not present

## 2019-10-28 DIAGNOSIS — E872 Acidosis: Secondary | ICD-10-CM | POA: Diagnosis not present

## 2019-10-28 DIAGNOSIS — R000 Tachycardia, unspecified: Secondary | ICD-10-CM | POA: Diagnosis not present

## 2019-10-28 DIAGNOSIS — T80211A Bloodstream infection due to central venous catheter, initial encounter: Secondary | ICD-10-CM | POA: Diagnosis not present

## 2019-10-28 DIAGNOSIS — Z89511 Acquired absence of right leg below knee: Secondary | ICD-10-CM | POA: Diagnosis not present

## 2019-10-28 DIAGNOSIS — R1312 Dysphagia, oropharyngeal phase: Secondary | ICD-10-CM | POA: Diagnosis not present

## 2019-10-28 DIAGNOSIS — R1311 Dysphagia, oral phase: Secondary | ICD-10-CM | POA: Diagnosis present

## 2019-10-28 DIAGNOSIS — G8929 Other chronic pain: Secondary | ICD-10-CM | POA: Diagnosis present

## 2019-10-28 DIAGNOSIS — Z89612 Acquired absence of left leg above knee: Secondary | ICD-10-CM | POA: Diagnosis not present

## 2019-10-28 DIAGNOSIS — E1169 Type 2 diabetes mellitus with other specified complication: Secondary | ICD-10-CM | POA: Diagnosis not present

## 2019-10-28 DIAGNOSIS — J9621 Acute and chronic respiratory failure with hypoxia: Secondary | ICD-10-CM | POA: Diagnosis not present

## 2019-10-28 DIAGNOSIS — L89102 Pressure ulcer of unspecified part of back, stage 2: Secondary | ICD-10-CM | POA: Diagnosis present

## 2019-10-28 DIAGNOSIS — I5022 Chronic systolic (congestive) heart failure: Secondary | ICD-10-CM | POA: Diagnosis present

## 2019-10-28 DIAGNOSIS — I132 Hypertensive heart and chronic kidney disease with heart failure and with stage 5 chronic kidney disease, or end stage renal disease: Secondary | ICD-10-CM | POA: Diagnosis present

## 2019-10-28 DIAGNOSIS — E785 Hyperlipidemia, unspecified: Secondary | ICD-10-CM | POA: Diagnosis not present

## 2019-10-28 DIAGNOSIS — I639 Cerebral infarction, unspecified: Secondary | ICD-10-CM | POA: Diagnosis not present

## 2019-10-28 DIAGNOSIS — G4733 Obstructive sleep apnea (adult) (pediatric): Secondary | ICD-10-CM | POA: Diagnosis present

## 2019-10-28 DIAGNOSIS — R0989 Other specified symptoms and signs involving the circulatory and respiratory systems: Secondary | ICD-10-CM | POA: Diagnosis not present

## 2019-10-28 DIAGNOSIS — R0689 Other abnormalities of breathing: Secondary | ICD-10-CM | POA: Diagnosis not present

## 2019-10-28 DIAGNOSIS — K8020 Calculus of gallbladder without cholecystitis without obstruction: Secondary | ICD-10-CM | POA: Diagnosis not present

## 2019-10-28 DIAGNOSIS — R0902 Hypoxemia: Secondary | ICD-10-CM | POA: Diagnosis not present

## 2019-10-28 DIAGNOSIS — E876 Hypokalemia: Secondary | ICD-10-CM | POA: Diagnosis not present

## 2019-10-28 DIAGNOSIS — K5900 Constipation, unspecified: Secondary | ICD-10-CM | POA: Diagnosis not present

## 2019-10-28 DIAGNOSIS — T8249XA Other complication of vascular dialysis catheter, initial encounter: Secondary | ICD-10-CM | POA: Diagnosis not present

## 2019-10-28 DIAGNOSIS — L89151 Pressure ulcer of sacral region, stage 1: Secondary | ICD-10-CM | POA: Diagnosis present

## 2019-10-28 DIAGNOSIS — M2550 Pain in unspecified joint: Secondary | ICD-10-CM | POA: Diagnosis not present

## 2019-10-28 DIAGNOSIS — A4189 Other specified sepsis: Secondary | ICD-10-CM | POA: Diagnosis not present

## 2019-10-28 DIAGNOSIS — I482 Chronic atrial fibrillation, unspecified: Secondary | ICD-10-CM | POA: Diagnosis not present

## 2019-10-28 DIAGNOSIS — F339 Major depressive disorder, recurrent, unspecified: Secondary | ICD-10-CM | POA: Diagnosis not present

## 2019-10-28 DIAGNOSIS — Z8673 Personal history of transient ischemic attack (TIA), and cerebral infarction without residual deficits: Secondary | ICD-10-CM | POA: Diagnosis not present

## 2019-10-28 DIAGNOSIS — R6521 Severe sepsis with septic shock: Secondary | ICD-10-CM | POA: Diagnosis not present

## 2019-10-28 DIAGNOSIS — R531 Weakness: Secondary | ICD-10-CM | POA: Diagnosis not present

## 2019-10-28 DIAGNOSIS — D631 Anemia in chronic kidney disease: Secondary | ICD-10-CM | POA: Diagnosis not present

## 2019-10-28 DIAGNOSIS — N390 Urinary tract infection, site not specified: Secondary | ICD-10-CM | POA: Diagnosis not present

## 2019-10-28 DIAGNOSIS — R509 Fever, unspecified: Secondary | ICD-10-CM | POA: Diagnosis not present

## 2019-10-28 DIAGNOSIS — I452 Bifascicular block: Secondary | ICD-10-CM | POA: Diagnosis present

## 2019-10-28 DIAGNOSIS — I959 Hypotension, unspecified: Secondary | ICD-10-CM | POA: Diagnosis not present

## 2019-10-28 DIAGNOSIS — I429 Cardiomyopathy, unspecified: Secondary | ICD-10-CM | POA: Diagnosis present

## 2019-10-28 DIAGNOSIS — N2581 Secondary hyperparathyroidism of renal origin: Secondary | ICD-10-CM | POA: Diagnosis not present

## 2019-10-28 DIAGNOSIS — J9811 Atelectasis: Secondary | ICD-10-CM | POA: Diagnosis not present

## 2019-10-28 DIAGNOSIS — M24522 Contracture, left elbow: Secondary | ICD-10-CM | POA: Diagnosis not present

## 2019-10-28 DIAGNOSIS — E875 Hyperkalemia: Secondary | ICD-10-CM | POA: Diagnosis not present

## 2019-10-28 DIAGNOSIS — R1310 Dysphagia, unspecified: Secondary | ICD-10-CM | POA: Diagnosis not present

## 2019-10-28 DIAGNOSIS — D509 Iron deficiency anemia, unspecified: Secondary | ICD-10-CM | POA: Diagnosis not present

## 2019-10-28 DIAGNOSIS — J9611 Chronic respiratory failure with hypoxia: Secondary | ICD-10-CM | POA: Diagnosis not present

## 2019-10-28 DIAGNOSIS — Z89611 Acquired absence of right leg above knee: Secondary | ICD-10-CM | POA: Diagnosis not present

## 2019-10-28 DIAGNOSIS — A4101 Sepsis due to Methicillin susceptible Staphylococcus aureus: Secondary | ICD-10-CM | POA: Diagnosis not present

## 2019-10-28 DIAGNOSIS — B9562 Methicillin resistant Staphylococcus aureus infection as the cause of diseases classified elsewhere: Secondary | ICD-10-CM | POA: Diagnosis not present

## 2019-10-28 DIAGNOSIS — I2510 Atherosclerotic heart disease of native coronary artery without angina pectoris: Secondary | ICD-10-CM | POA: Diagnosis present

## 2019-10-28 DIAGNOSIS — H16323 Diffuse interstitial keratitis, bilateral: Secondary | ICD-10-CM | POA: Diagnosis not present

## 2019-10-28 DIAGNOSIS — Z20822 Contact with and (suspected) exposure to covid-19: Secondary | ICD-10-CM | POA: Diagnosis not present

## 2019-10-28 DIAGNOSIS — Z9981 Dependence on supplemental oxygen: Secondary | ICD-10-CM | POA: Diagnosis not present

## 2019-10-28 DIAGNOSIS — N186 End stage renal disease: Secondary | ICD-10-CM | POA: Diagnosis not present

## 2019-10-28 DIAGNOSIS — H548 Legal blindness, as defined in USA: Secondary | ICD-10-CM | POA: Diagnosis not present

## 2019-10-28 DIAGNOSIS — I10 Essential (primary) hypertension: Secondary | ICD-10-CM | POA: Diagnosis not present

## 2019-10-28 DIAGNOSIS — I444 Left anterior fascicular block: Secondary | ICD-10-CM | POA: Diagnosis not present

## 2019-10-28 DIAGNOSIS — N3001 Acute cystitis with hematuria: Secondary | ICD-10-CM | POA: Diagnosis not present

## 2019-10-28 DIAGNOSIS — M545 Low back pain: Secondary | ICD-10-CM | POA: Diagnosis not present

## 2019-10-28 DIAGNOSIS — T68XXXA Hypothermia, initial encounter: Secondary | ICD-10-CM | POA: Diagnosis not present

## 2019-10-28 DIAGNOSIS — G40909 Epilepsy, unspecified, not intractable, without status epilepticus: Secondary | ICD-10-CM | POA: Diagnosis not present

## 2019-10-28 DIAGNOSIS — R4182 Altered mental status, unspecified: Secondary | ICD-10-CM | POA: Diagnosis not present

## 2019-10-28 DIAGNOSIS — B957 Other staphylococcus as the cause of diseases classified elsewhere: Secondary | ICD-10-CM | POA: Diagnosis not present

## 2019-10-28 DIAGNOSIS — Z992 Dependence on renal dialysis: Secondary | ICD-10-CM | POA: Diagnosis not present

## 2019-10-28 DIAGNOSIS — A419 Sepsis, unspecified organism: Secondary | ICD-10-CM | POA: Diagnosis not present

## 2019-10-28 DIAGNOSIS — I120 Hypertensive chronic kidney disease with stage 5 chronic kidney disease or end stage renal disease: Secondary | ICD-10-CM | POA: Diagnosis not present

## 2019-10-28 DIAGNOSIS — D649 Anemia, unspecified: Secondary | ICD-10-CM | POA: Diagnosis not present

## 2019-10-28 DIAGNOSIS — I69954 Hemiplegia and hemiparesis following unspecified cerebrovascular disease affecting left non-dominant side: Secondary | ICD-10-CM | POA: Diagnosis not present

## 2019-10-28 DIAGNOSIS — M24542 Contracture, left hand: Secondary | ICD-10-CM | POA: Diagnosis not present

## 2019-10-28 DIAGNOSIS — E559 Vitamin D deficiency, unspecified: Secondary | ICD-10-CM | POA: Diagnosis not present

## 2019-10-28 DIAGNOSIS — M109 Gout, unspecified: Secondary | ICD-10-CM | POA: Diagnosis not present

## 2019-11-07 DIAGNOSIS — G40909 Epilepsy, unspecified, not intractable, without status epilepticus: Secondary | ICD-10-CM | POA: Diagnosis not present

## 2019-11-07 DIAGNOSIS — M24542 Contracture, left hand: Secondary | ICD-10-CM | POA: Diagnosis not present

## 2019-11-07 DIAGNOSIS — Z79899 Other long term (current) drug therapy: Secondary | ICD-10-CM | POA: Diagnosis not present

## 2019-11-07 DIAGNOSIS — Z7401 Bed confinement status: Secondary | ICD-10-CM | POA: Diagnosis not present

## 2019-11-07 DIAGNOSIS — I482 Chronic atrial fibrillation, unspecified: Secondary | ICD-10-CM | POA: Diagnosis not present

## 2019-11-07 DIAGNOSIS — E875 Hyperkalemia: Secondary | ICD-10-CM | POA: Diagnosis not present

## 2019-11-07 DIAGNOSIS — Z992 Dependence on renal dialysis: Secondary | ICD-10-CM | POA: Diagnosis not present

## 2019-11-07 DIAGNOSIS — E559 Vitamin D deficiency, unspecified: Secondary | ICD-10-CM | POA: Diagnosis not present

## 2019-11-07 DIAGNOSIS — T8249XA Other complication of vascular dialysis catheter, initial encounter: Secondary | ICD-10-CM | POA: Diagnosis not present

## 2019-11-07 DIAGNOSIS — Z8673 Personal history of transient ischemic attack (TIA), and cerebral infarction without residual deficits: Secondary | ICD-10-CM | POA: Diagnosis not present

## 2019-11-07 DIAGNOSIS — H16323 Diffuse interstitial keratitis, bilateral: Secondary | ICD-10-CM | POA: Diagnosis not present

## 2019-11-07 DIAGNOSIS — K5900 Constipation, unspecified: Secondary | ICD-10-CM | POA: Diagnosis not present

## 2019-11-07 DIAGNOSIS — M109 Gout, unspecified: Secondary | ICD-10-CM | POA: Diagnosis not present

## 2019-11-07 DIAGNOSIS — I5022 Chronic systolic (congestive) heart failure: Secondary | ICD-10-CM | POA: Diagnosis not present

## 2019-11-07 DIAGNOSIS — A4101 Sepsis due to Methicillin susceptible Staphylococcus aureus: Secondary | ICD-10-CM | POA: Diagnosis not present

## 2019-11-07 DIAGNOSIS — I429 Cardiomyopathy, unspecified: Secondary | ICD-10-CM | POA: Diagnosis not present

## 2019-11-07 DIAGNOSIS — M2550 Pain in unspecified joint: Secondary | ICD-10-CM | POA: Diagnosis not present

## 2019-11-07 DIAGNOSIS — N2581 Secondary hyperparathyroidism of renal origin: Secondary | ICD-10-CM | POA: Diagnosis not present

## 2019-11-07 DIAGNOSIS — D649 Anemia, unspecified: Secondary | ICD-10-CM | POA: Diagnosis not present

## 2019-11-07 DIAGNOSIS — Z89511 Acquired absence of right leg below knee: Secondary | ICD-10-CM | POA: Diagnosis not present

## 2019-11-07 DIAGNOSIS — N390 Urinary tract infection, site not specified: Secondary | ICD-10-CM | POA: Diagnosis not present

## 2019-11-07 DIAGNOSIS — E785 Hyperlipidemia, unspecified: Secondary | ICD-10-CM | POA: Diagnosis not present

## 2019-11-07 DIAGNOSIS — F339 Major depressive disorder, recurrent, unspecified: Secondary | ICD-10-CM | POA: Diagnosis not present

## 2019-11-07 DIAGNOSIS — A419 Sepsis, unspecified organism: Secondary | ICD-10-CM | POA: Diagnosis not present

## 2019-11-07 DIAGNOSIS — R4182 Altered mental status, unspecified: Secondary | ICD-10-CM | POA: Diagnosis not present

## 2019-11-07 DIAGNOSIS — N186 End stage renal disease: Secondary | ICD-10-CM | POA: Diagnosis not present

## 2019-11-07 DIAGNOSIS — E039 Hypothyroidism, unspecified: Secondary | ICD-10-CM | POA: Diagnosis not present

## 2019-11-07 DIAGNOSIS — I10 Essential (primary) hypertension: Secondary | ICD-10-CM | POA: Diagnosis not present

## 2019-11-07 DIAGNOSIS — R6521 Severe sepsis with septic shock: Secondary | ICD-10-CM | POA: Diagnosis not present

## 2019-11-07 DIAGNOSIS — E1169 Type 2 diabetes mellitus with other specified complication: Secondary | ICD-10-CM | POA: Diagnosis not present

## 2019-11-07 DIAGNOSIS — E119 Type 2 diabetes mellitus without complications: Secondary | ICD-10-CM | POA: Diagnosis not present

## 2019-11-07 DIAGNOSIS — R509 Fever, unspecified: Secondary | ICD-10-CM | POA: Diagnosis not present

## 2019-11-07 DIAGNOSIS — H548 Legal blindness, as defined in USA: Secondary | ICD-10-CM | POA: Diagnosis not present

## 2019-11-07 DIAGNOSIS — Z89612 Acquired absence of left leg above knee: Secondary | ICD-10-CM | POA: Diagnosis not present

## 2019-11-07 DIAGNOSIS — I69954 Hemiplegia and hemiparesis following unspecified cerebrovascular disease affecting left non-dominant side: Secondary | ICD-10-CM | POA: Diagnosis not present

## 2019-11-07 DIAGNOSIS — D509 Iron deficiency anemia, unspecified: Secondary | ICD-10-CM | POA: Diagnosis not present

## 2019-11-07 DIAGNOSIS — M545 Low back pain: Secondary | ICD-10-CM | POA: Diagnosis not present

## 2019-11-07 DIAGNOSIS — D631 Anemia in chronic kidney disease: Secondary | ICD-10-CM | POA: Diagnosis not present

## 2019-11-07 DIAGNOSIS — I639 Cerebral infarction, unspecified: Secondary | ICD-10-CM | POA: Diagnosis not present

## 2019-11-07 DIAGNOSIS — R531 Weakness: Secondary | ICD-10-CM | POA: Diagnosis not present

## 2019-11-09 DIAGNOSIS — N186 End stage renal disease: Secondary | ICD-10-CM | POA: Diagnosis not present

## 2019-11-09 DIAGNOSIS — T8249XA Other complication of vascular dialysis catheter, initial encounter: Secondary | ICD-10-CM | POA: Diagnosis not present

## 2019-11-09 DIAGNOSIS — A419 Sepsis, unspecified organism: Secondary | ICD-10-CM | POA: Diagnosis not present

## 2019-11-09 DIAGNOSIS — D509 Iron deficiency anemia, unspecified: Secondary | ICD-10-CM | POA: Diagnosis not present

## 2019-11-09 DIAGNOSIS — N2581 Secondary hyperparathyroidism of renal origin: Secondary | ICD-10-CM | POA: Diagnosis not present

## 2019-11-09 DIAGNOSIS — D631 Anemia in chronic kidney disease: Secondary | ICD-10-CM | POA: Diagnosis not present

## 2019-11-10 DIAGNOSIS — B9629 Other Escherichia coli [E. coli] as the cause of diseases classified elsewhere: Secondary | ICD-10-CM | POA: Diagnosis not present

## 2019-11-10 DIAGNOSIS — N390 Urinary tract infection, site not specified: Secondary | ICD-10-CM | POA: Diagnosis not present

## 2019-11-10 DIAGNOSIS — B958 Unspecified staphylococcus as the cause of diseases classified elsewhere: Secondary | ICD-10-CM | POA: Diagnosis not present

## 2019-11-10 DIAGNOSIS — R7881 Bacteremia: Secondary | ICD-10-CM | POA: Diagnosis not present

## 2019-11-12 DIAGNOSIS — N186 End stage renal disease: Secondary | ICD-10-CM | POA: Diagnosis not present

## 2019-11-12 DIAGNOSIS — I739 Peripheral vascular disease, unspecified: Secondary | ICD-10-CM | POA: Diagnosis not present

## 2019-11-12 DIAGNOSIS — E119 Type 2 diabetes mellitus without complications: Secondary | ICD-10-CM | POA: Diagnosis not present

## 2019-11-12 DIAGNOSIS — I69351 Hemiplegia and hemiparesis following cerebral infarction affecting right dominant side: Secondary | ICD-10-CM | POA: Diagnosis not present

## 2019-11-16 DIAGNOSIS — I132 Hypertensive heart and chronic kidney disease with heart failure and with stage 5 chronic kidney disease, or end stage renal disease: Secondary | ICD-10-CM | POA: Diagnosis not present

## 2019-11-16 DIAGNOSIS — E1122 Type 2 diabetes mellitus with diabetic chronic kidney disease: Secondary | ICD-10-CM | POA: Diagnosis not present

## 2019-11-16 DIAGNOSIS — E119 Type 2 diabetes mellitus without complications: Secondary | ICD-10-CM | POA: Diagnosis not present

## 2019-11-16 DIAGNOSIS — E1140 Type 2 diabetes mellitus with diabetic neuropathy, unspecified: Secondary | ICD-10-CM | POA: Diagnosis not present

## 2019-11-16 DIAGNOSIS — M150 Primary generalized (osteo)arthritis: Secondary | ICD-10-CM | POA: Diagnosis not present

## 2019-11-16 DIAGNOSIS — I5022 Chronic systolic (congestive) heart failure: Secondary | ICD-10-CM | POA: Diagnosis not present

## 2019-11-16 DIAGNOSIS — I10 Essential (primary) hypertension: Secondary | ICD-10-CM | POA: Diagnosis not present

## 2019-11-16 DIAGNOSIS — N186 End stage renal disease: Secondary | ICD-10-CM | POA: Diagnosis not present

## 2019-11-16 DIAGNOSIS — F322 Major depressive disorder, single episode, severe without psychotic features: Secondary | ICD-10-CM | POA: Diagnosis not present

## 2019-11-18 DIAGNOSIS — E119 Type 2 diabetes mellitus without complications: Secondary | ICD-10-CM | POA: Diagnosis not present

## 2019-11-20 DIAGNOSIS — H16323 Diffuse interstitial keratitis, bilateral: Secondary | ICD-10-CM | POA: Diagnosis not present

## 2019-11-20 DIAGNOSIS — M109 Gout, unspecified: Secondary | ICD-10-CM | POA: Diagnosis not present

## 2019-11-20 DIAGNOSIS — Z6841 Body Mass Index (BMI) 40.0 and over, adult: Secondary | ICD-10-CM | POA: Diagnosis not present

## 2019-11-20 DIAGNOSIS — E213 Hyperparathyroidism, unspecified: Secondary | ICD-10-CM | POA: Diagnosis present

## 2019-11-20 DIAGNOSIS — E559 Vitamin D deficiency, unspecified: Secondary | ICD-10-CM | POA: Diagnosis not present

## 2019-11-20 DIAGNOSIS — Z89612 Acquired absence of left leg above knee: Secondary | ICD-10-CM | POA: Diagnosis not present

## 2019-11-20 DIAGNOSIS — M545 Low back pain: Secondary | ICD-10-CM | POA: Diagnosis not present

## 2019-11-20 DIAGNOSIS — J986 Disorders of diaphragm: Secondary | ICD-10-CM | POA: Diagnosis not present

## 2019-11-20 DIAGNOSIS — N186 End stage renal disease: Secondary | ICD-10-CM | POA: Diagnosis not present

## 2019-11-20 DIAGNOSIS — T8249XA Other complication of vascular dialysis catheter, initial encounter: Secondary | ICD-10-CM | POA: Diagnosis not present

## 2019-11-20 DIAGNOSIS — A419 Sepsis, unspecified organism: Secondary | ICD-10-CM | POA: Diagnosis not present

## 2019-11-20 DIAGNOSIS — A4101 Sepsis due to Methicillin susceptible Staphylococcus aureus: Secondary | ICD-10-CM | POA: Diagnosis not present

## 2019-11-20 DIAGNOSIS — I429 Cardiomyopathy, unspecified: Secondary | ICD-10-CM | POA: Diagnosis not present

## 2019-11-20 DIAGNOSIS — E119 Type 2 diabetes mellitus without complications: Secondary | ICD-10-CM | POA: Diagnosis not present

## 2019-11-20 DIAGNOSIS — D509 Iron deficiency anemia, unspecified: Secondary | ICD-10-CM | POA: Diagnosis not present

## 2019-11-20 DIAGNOSIS — K5900 Constipation, unspecified: Secondary | ICD-10-CM | POA: Diagnosis not present

## 2019-11-20 DIAGNOSIS — R6520 Severe sepsis without septic shock: Secondary | ICD-10-CM | POA: Diagnosis present

## 2019-11-20 DIAGNOSIS — I255 Ischemic cardiomyopathy: Secondary | ICD-10-CM | POA: Diagnosis present

## 2019-11-20 DIAGNOSIS — I5022 Chronic systolic (congestive) heart failure: Secondary | ICD-10-CM | POA: Diagnosis present

## 2019-11-20 DIAGNOSIS — E1140 Type 2 diabetes mellitus with diabetic neuropathy, unspecified: Secondary | ICD-10-CM | POA: Diagnosis not present

## 2019-11-20 DIAGNOSIS — I639 Cerebral infarction, unspecified: Secondary | ICD-10-CM | POA: Diagnosis not present

## 2019-11-20 DIAGNOSIS — K920 Hematemesis: Secondary | ICD-10-CM | POA: Diagnosis not present

## 2019-11-20 DIAGNOSIS — J690 Pneumonitis due to inhalation of food and vomit: Secondary | ICD-10-CM | POA: Diagnosis not present

## 2019-11-20 DIAGNOSIS — E1122 Type 2 diabetes mellitus with diabetic chronic kidney disease: Secondary | ICD-10-CM | POA: Diagnosis not present

## 2019-11-20 DIAGNOSIS — Z20822 Contact with and (suspected) exposure to covid-19: Secondary | ICD-10-CM | POA: Diagnosis not present

## 2019-11-20 DIAGNOSIS — H548 Legal blindness, as defined in USA: Secondary | ICD-10-CM | POA: Diagnosis present

## 2019-11-20 DIAGNOSIS — G9341 Metabolic encephalopathy: Secondary | ICD-10-CM | POA: Diagnosis present

## 2019-11-20 DIAGNOSIS — I10 Essential (primary) hypertension: Secondary | ICD-10-CM | POA: Diagnosis not present

## 2019-11-20 DIAGNOSIS — Z89511 Acquired absence of right leg below knee: Secondary | ICD-10-CM | POA: Diagnosis not present

## 2019-11-20 DIAGNOSIS — E1169 Type 2 diabetes mellitus with other specified complication: Secondary | ICD-10-CM | POA: Diagnosis not present

## 2019-11-20 DIAGNOSIS — R509 Fever, unspecified: Secondary | ICD-10-CM | POA: Diagnosis not present

## 2019-11-20 DIAGNOSIS — Z992 Dependence on renal dialysis: Secondary | ICD-10-CM | POA: Diagnosis not present

## 2019-11-20 DIAGNOSIS — I69354 Hemiplegia and hemiparesis following cerebral infarction affecting left non-dominant side: Secondary | ICD-10-CM | POA: Diagnosis not present

## 2019-11-20 DIAGNOSIS — Z8673 Personal history of transient ischemic attack (TIA), and cerebral infarction without residual deficits: Secondary | ICD-10-CM | POA: Diagnosis not present

## 2019-11-20 DIAGNOSIS — M150 Primary generalized (osteo)arthritis: Secondary | ICD-10-CM | POA: Diagnosis not present

## 2019-11-20 DIAGNOSIS — I739 Peripheral vascular disease, unspecified: Secondary | ICD-10-CM | POA: Diagnosis not present

## 2019-11-20 DIAGNOSIS — N2581 Secondary hyperparathyroidism of renal origin: Secondary | ICD-10-CM | POA: Diagnosis not present

## 2019-11-20 DIAGNOSIS — I471 Supraventricular tachycardia: Secondary | ICD-10-CM | POA: Diagnosis present

## 2019-11-20 DIAGNOSIS — D696 Thrombocytopenia, unspecified: Secondary | ICD-10-CM | POA: Diagnosis present

## 2019-11-20 DIAGNOSIS — I482 Chronic atrial fibrillation, unspecified: Secondary | ICD-10-CM | POA: Diagnosis not present

## 2019-11-20 DIAGNOSIS — R1312 Dysphagia, oropharyngeal phase: Secondary | ICD-10-CM | POA: Diagnosis not present

## 2019-11-20 DIAGNOSIS — F339 Major depressive disorder, recurrent, unspecified: Secondary | ICD-10-CM | POA: Diagnosis not present

## 2019-11-20 DIAGNOSIS — R000 Tachycardia, unspecified: Secondary | ICD-10-CM | POA: Diagnosis not present

## 2019-11-20 DIAGNOSIS — M24522 Contracture, left elbow: Secondary | ICD-10-CM | POA: Diagnosis not present

## 2019-11-20 DIAGNOSIS — A411 Sepsis due to other specified staphylococcus: Secondary | ICD-10-CM | POA: Diagnosis present

## 2019-11-20 DIAGNOSIS — E875 Hyperkalemia: Secondary | ICD-10-CM | POA: Diagnosis present

## 2019-11-20 DIAGNOSIS — M24542 Contracture, left hand: Secondary | ICD-10-CM | POA: Diagnosis not present

## 2019-11-20 DIAGNOSIS — I132 Hypertensive heart and chronic kidney disease with heart failure and with stage 5 chronic kidney disease, or end stage renal disease: Secondary | ICD-10-CM | POA: Diagnosis not present

## 2019-11-20 DIAGNOSIS — D631 Anemia in chronic kidney disease: Secondary | ICD-10-CM | POA: Diagnosis not present

## 2019-11-20 DIAGNOSIS — I120 Hypertensive chronic kidney disease with stage 5 chronic kidney disease or end stage renal disease: Secondary | ICD-10-CM | POA: Diagnosis not present

## 2019-11-20 DIAGNOSIS — F322 Major depressive disorder, single episode, severe without psychotic features: Secondary | ICD-10-CM | POA: Diagnosis not present

## 2019-11-20 DIAGNOSIS — E876 Hypokalemia: Secondary | ICD-10-CM | POA: Diagnosis present

## 2019-11-20 DIAGNOSIS — N390 Urinary tract infection, site not specified: Secondary | ICD-10-CM | POA: Diagnosis not present

## 2019-11-20 DIAGNOSIS — I69954 Hemiplegia and hemiparesis following unspecified cerebrovascular disease affecting left non-dominant side: Secondary | ICD-10-CM | POA: Diagnosis not present

## 2019-11-20 DIAGNOSIS — Z79899 Other long term (current) drug therapy: Secondary | ICD-10-CM | POA: Diagnosis not present

## 2019-11-20 DIAGNOSIS — D72829 Elevated white blood cell count, unspecified: Secondary | ICD-10-CM | POA: Diagnosis not present

## 2019-11-20 DIAGNOSIS — J9611 Chronic respiratory failure with hypoxia: Secondary | ICD-10-CM | POA: Diagnosis present

## 2019-11-20 DIAGNOSIS — G40909 Epilepsy, unspecified, not intractable, without status epilepticus: Secondary | ICD-10-CM | POA: Diagnosis not present

## 2019-11-20 DIAGNOSIS — I480 Paroxysmal atrial fibrillation: Secondary | ICD-10-CM | POA: Diagnosis present

## 2019-12-10 DIAGNOSIS — N186 End stage renal disease: Secondary | ICD-10-CM | POA: Diagnosis not present

## 2019-12-10 DIAGNOSIS — D631 Anemia in chronic kidney disease: Secondary | ICD-10-CM | POA: Diagnosis not present

## 2019-12-10 DIAGNOSIS — I10 Essential (primary) hypertension: Secondary | ICD-10-CM | POA: Diagnosis not present

## 2019-12-11 DIAGNOSIS — D509 Iron deficiency anemia, unspecified: Secondary | ICD-10-CM | POA: Diagnosis not present

## 2019-12-11 DIAGNOSIS — N186 End stage renal disease: Secondary | ICD-10-CM | POA: Diagnosis not present

## 2019-12-11 DIAGNOSIS — T8249XA Other complication of vascular dialysis catheter, initial encounter: Secondary | ICD-10-CM | POA: Diagnosis not present

## 2019-12-11 DIAGNOSIS — N2581 Secondary hyperparathyroidism of renal origin: Secondary | ICD-10-CM | POA: Diagnosis not present

## 2019-12-11 DIAGNOSIS — D631 Anemia in chronic kidney disease: Secondary | ICD-10-CM | POA: Diagnosis not present

## 2019-12-14 DIAGNOSIS — D509 Iron deficiency anemia, unspecified: Secondary | ICD-10-CM | POA: Diagnosis not present

## 2019-12-14 DIAGNOSIS — N186 End stage renal disease: Secondary | ICD-10-CM | POA: Diagnosis not present

## 2019-12-14 DIAGNOSIS — N2581 Secondary hyperparathyroidism of renal origin: Secondary | ICD-10-CM | POA: Diagnosis not present

## 2019-12-14 DIAGNOSIS — D631 Anemia in chronic kidney disease: Secondary | ICD-10-CM | POA: Diagnosis not present

## 2019-12-14 DIAGNOSIS — T8249XA Other complication of vascular dialysis catheter, initial encounter: Secondary | ICD-10-CM | POA: Diagnosis not present

## 2019-12-16 DIAGNOSIS — I5022 Chronic systolic (congestive) heart failure: Secondary | ICD-10-CM | POA: Diagnosis not present

## 2019-12-16 DIAGNOSIS — M150 Primary generalized (osteo)arthritis: Secondary | ICD-10-CM | POA: Diagnosis not present

## 2019-12-16 DIAGNOSIS — N186 End stage renal disease: Secondary | ICD-10-CM | POA: Diagnosis not present

## 2019-12-16 DIAGNOSIS — N2581 Secondary hyperparathyroidism of renal origin: Secondary | ICD-10-CM | POA: Diagnosis not present

## 2019-12-16 DIAGNOSIS — E119 Type 2 diabetes mellitus without complications: Secondary | ICD-10-CM | POA: Diagnosis not present

## 2019-12-16 DIAGNOSIS — D509 Iron deficiency anemia, unspecified: Secondary | ICD-10-CM | POA: Diagnosis not present

## 2019-12-16 DIAGNOSIS — I132 Hypertensive heart and chronic kidney disease with heart failure and with stage 5 chronic kidney disease, or end stage renal disease: Secondary | ICD-10-CM | POA: Diagnosis not present

## 2019-12-16 DIAGNOSIS — E1122 Type 2 diabetes mellitus with diabetic chronic kidney disease: Secondary | ICD-10-CM | POA: Diagnosis not present

## 2019-12-16 DIAGNOSIS — E1140 Type 2 diabetes mellitus with diabetic neuropathy, unspecified: Secondary | ICD-10-CM | POA: Diagnosis not present

## 2019-12-16 DIAGNOSIS — T8249XA Other complication of vascular dialysis catheter, initial encounter: Secondary | ICD-10-CM | POA: Diagnosis not present

## 2019-12-16 DIAGNOSIS — D631 Anemia in chronic kidney disease: Secondary | ICD-10-CM | POA: Diagnosis not present

## 2019-12-16 DIAGNOSIS — F322 Major depressive disorder, single episode, severe without psychotic features: Secondary | ICD-10-CM | POA: Diagnosis not present

## 2019-12-16 DIAGNOSIS — I10 Essential (primary) hypertension: Secondary | ICD-10-CM | POA: Diagnosis not present

## 2019-12-18 DIAGNOSIS — D631 Anemia in chronic kidney disease: Secondary | ICD-10-CM | POA: Diagnosis not present

## 2019-12-18 DIAGNOSIS — T8249XA Other complication of vascular dialysis catheter, initial encounter: Secondary | ICD-10-CM | POA: Diagnosis not present

## 2019-12-18 DIAGNOSIS — D509 Iron deficiency anemia, unspecified: Secondary | ICD-10-CM | POA: Diagnosis not present

## 2019-12-18 DIAGNOSIS — N186 End stage renal disease: Secondary | ICD-10-CM | POA: Diagnosis not present

## 2019-12-18 DIAGNOSIS — N2581 Secondary hyperparathyroidism of renal origin: Secondary | ICD-10-CM | POA: Diagnosis not present

## 2019-12-21 DIAGNOSIS — N2581 Secondary hyperparathyroidism of renal origin: Secondary | ICD-10-CM | POA: Diagnosis not present

## 2019-12-21 DIAGNOSIS — D631 Anemia in chronic kidney disease: Secondary | ICD-10-CM | POA: Diagnosis not present

## 2019-12-21 DIAGNOSIS — D509 Iron deficiency anemia, unspecified: Secondary | ICD-10-CM | POA: Diagnosis not present

## 2019-12-21 DIAGNOSIS — N186 End stage renal disease: Secondary | ICD-10-CM | POA: Diagnosis not present

## 2019-12-21 DIAGNOSIS — T8249XA Other complication of vascular dialysis catheter, initial encounter: Secondary | ICD-10-CM | POA: Diagnosis not present

## 2019-12-23 DIAGNOSIS — D509 Iron deficiency anemia, unspecified: Secondary | ICD-10-CM | POA: Diagnosis not present

## 2019-12-23 DIAGNOSIS — N2581 Secondary hyperparathyroidism of renal origin: Secondary | ICD-10-CM | POA: Diagnosis not present

## 2019-12-23 DIAGNOSIS — T8249XA Other complication of vascular dialysis catheter, initial encounter: Secondary | ICD-10-CM | POA: Diagnosis not present

## 2019-12-23 DIAGNOSIS — D631 Anemia in chronic kidney disease: Secondary | ICD-10-CM | POA: Diagnosis not present

## 2019-12-23 DIAGNOSIS — N186 End stage renal disease: Secondary | ICD-10-CM | POA: Diagnosis not present

## 2019-12-24 DIAGNOSIS — E1122 Type 2 diabetes mellitus with diabetic chronic kidney disease: Secondary | ICD-10-CM | POA: Diagnosis not present

## 2019-12-24 DIAGNOSIS — N186 End stage renal disease: Secondary | ICD-10-CM | POA: Diagnosis not present

## 2019-12-24 DIAGNOSIS — I5022 Chronic systolic (congestive) heart failure: Secondary | ICD-10-CM | POA: Diagnosis not present

## 2019-12-24 DIAGNOSIS — I10 Essential (primary) hypertension: Secondary | ICD-10-CM | POA: Diagnosis not present

## 2019-12-25 DIAGNOSIS — D509 Iron deficiency anemia, unspecified: Secondary | ICD-10-CM | POA: Diagnosis not present

## 2019-12-25 DIAGNOSIS — N186 End stage renal disease: Secondary | ICD-10-CM | POA: Diagnosis not present

## 2019-12-25 DIAGNOSIS — T8249XA Other complication of vascular dialysis catheter, initial encounter: Secondary | ICD-10-CM | POA: Diagnosis not present

## 2019-12-25 DIAGNOSIS — D631 Anemia in chronic kidney disease: Secondary | ICD-10-CM | POA: Diagnosis not present

## 2019-12-25 DIAGNOSIS — N2581 Secondary hyperparathyroidism of renal origin: Secondary | ICD-10-CM | POA: Diagnosis not present

## 2019-12-28 DIAGNOSIS — N2581 Secondary hyperparathyroidism of renal origin: Secondary | ICD-10-CM | POA: Diagnosis not present

## 2019-12-28 DIAGNOSIS — N186 End stage renal disease: Secondary | ICD-10-CM | POA: Diagnosis not present

## 2019-12-28 DIAGNOSIS — T8249XA Other complication of vascular dialysis catheter, initial encounter: Secondary | ICD-10-CM | POA: Diagnosis not present

## 2019-12-28 DIAGNOSIS — D631 Anemia in chronic kidney disease: Secondary | ICD-10-CM | POA: Diagnosis not present

## 2019-12-28 DIAGNOSIS — D509 Iron deficiency anemia, unspecified: Secondary | ICD-10-CM | POA: Diagnosis not present

## 2019-12-30 DIAGNOSIS — D631 Anemia in chronic kidney disease: Secondary | ICD-10-CM | POA: Diagnosis not present

## 2019-12-30 DIAGNOSIS — T8249XA Other complication of vascular dialysis catheter, initial encounter: Secondary | ICD-10-CM | POA: Diagnosis not present

## 2019-12-30 DIAGNOSIS — N2581 Secondary hyperparathyroidism of renal origin: Secondary | ICD-10-CM | POA: Diagnosis not present

## 2019-12-30 DIAGNOSIS — D509 Iron deficiency anemia, unspecified: Secondary | ICD-10-CM | POA: Diagnosis not present

## 2019-12-30 DIAGNOSIS — N186 End stage renal disease: Secondary | ICD-10-CM | POA: Diagnosis not present

## 2020-01-01 DIAGNOSIS — N186 End stage renal disease: Secondary | ICD-10-CM | POA: Diagnosis not present

## 2020-01-01 DIAGNOSIS — D509 Iron deficiency anemia, unspecified: Secondary | ICD-10-CM | POA: Diagnosis not present

## 2020-01-01 DIAGNOSIS — D631 Anemia in chronic kidney disease: Secondary | ICD-10-CM | POA: Diagnosis not present

## 2020-01-01 DIAGNOSIS — N2581 Secondary hyperparathyroidism of renal origin: Secondary | ICD-10-CM | POA: Diagnosis not present

## 2020-01-01 DIAGNOSIS — T8249XA Other complication of vascular dialysis catheter, initial encounter: Secondary | ICD-10-CM | POA: Diagnosis not present

## 2020-01-04 DIAGNOSIS — N186 End stage renal disease: Secondary | ICD-10-CM | POA: Diagnosis not present

## 2020-01-04 DIAGNOSIS — T8249XA Other complication of vascular dialysis catheter, initial encounter: Secondary | ICD-10-CM | POA: Diagnosis not present

## 2020-01-04 DIAGNOSIS — D509 Iron deficiency anemia, unspecified: Secondary | ICD-10-CM | POA: Diagnosis not present

## 2020-01-04 DIAGNOSIS — N2581 Secondary hyperparathyroidism of renal origin: Secondary | ICD-10-CM | POA: Diagnosis not present

## 2020-01-04 DIAGNOSIS — D631 Anemia in chronic kidney disease: Secondary | ICD-10-CM | POA: Diagnosis not present

## 2020-01-06 DIAGNOSIS — N186 End stage renal disease: Secondary | ICD-10-CM | POA: Diagnosis not present

## 2020-01-06 DIAGNOSIS — T8249XA Other complication of vascular dialysis catheter, initial encounter: Secondary | ICD-10-CM | POA: Diagnosis not present

## 2020-01-06 DIAGNOSIS — D509 Iron deficiency anemia, unspecified: Secondary | ICD-10-CM | POA: Diagnosis not present

## 2020-01-06 DIAGNOSIS — N2581 Secondary hyperparathyroidism of renal origin: Secondary | ICD-10-CM | POA: Diagnosis not present

## 2020-01-06 DIAGNOSIS — D631 Anemia in chronic kidney disease: Secondary | ICD-10-CM | POA: Diagnosis not present

## 2020-01-08 DIAGNOSIS — N2581 Secondary hyperparathyroidism of renal origin: Secondary | ICD-10-CM | POA: Diagnosis not present

## 2020-01-08 DIAGNOSIS — N186 End stage renal disease: Secondary | ICD-10-CM | POA: Diagnosis not present

## 2020-01-08 DIAGNOSIS — T8249XA Other complication of vascular dialysis catheter, initial encounter: Secondary | ICD-10-CM | POA: Diagnosis not present

## 2020-01-08 DIAGNOSIS — D509 Iron deficiency anemia, unspecified: Secondary | ICD-10-CM | POA: Diagnosis not present

## 2020-01-08 DIAGNOSIS — D631 Anemia in chronic kidney disease: Secondary | ICD-10-CM | POA: Diagnosis not present

## 2020-01-10 DIAGNOSIS — N186 End stage renal disease: Secondary | ICD-10-CM | POA: Diagnosis not present

## 2020-01-10 DIAGNOSIS — D631 Anemia in chronic kidney disease: Secondary | ICD-10-CM | POA: Diagnosis not present

## 2020-01-10 DIAGNOSIS — I10 Essential (primary) hypertension: Secondary | ICD-10-CM | POA: Diagnosis not present

## 2020-01-11 DIAGNOSIS — T8249XA Other complication of vascular dialysis catheter, initial encounter: Secondary | ICD-10-CM | POA: Diagnosis not present

## 2020-01-11 DIAGNOSIS — D509 Iron deficiency anemia, unspecified: Secondary | ICD-10-CM | POA: Diagnosis not present

## 2020-01-11 DIAGNOSIS — N186 End stage renal disease: Secondary | ICD-10-CM | POA: Diagnosis not present

## 2020-01-11 DIAGNOSIS — N2581 Secondary hyperparathyroidism of renal origin: Secondary | ICD-10-CM | POA: Diagnosis not present

## 2020-01-11 DIAGNOSIS — D631 Anemia in chronic kidney disease: Secondary | ICD-10-CM | POA: Diagnosis not present

## 2020-01-12 DIAGNOSIS — M150 Primary generalized (osteo)arthritis: Secondary | ICD-10-CM | POA: Diagnosis not present

## 2020-01-12 DIAGNOSIS — I132 Hypertensive heart and chronic kidney disease with heart failure and with stage 5 chronic kidney disease, or end stage renal disease: Secondary | ICD-10-CM | POA: Diagnosis not present

## 2020-01-12 DIAGNOSIS — E1122 Type 2 diabetes mellitus with diabetic chronic kidney disease: Secondary | ICD-10-CM | POA: Diagnosis not present

## 2020-01-12 DIAGNOSIS — E1140 Type 2 diabetes mellitus with diabetic neuropathy, unspecified: Secondary | ICD-10-CM | POA: Diagnosis not present

## 2020-01-12 DIAGNOSIS — I10 Essential (primary) hypertension: Secondary | ICD-10-CM | POA: Diagnosis not present

## 2020-01-12 DIAGNOSIS — F322 Major depressive disorder, single episode, severe without psychotic features: Secondary | ICD-10-CM | POA: Diagnosis not present

## 2020-01-12 DIAGNOSIS — E119 Type 2 diabetes mellitus without complications: Secondary | ICD-10-CM | POA: Diagnosis not present

## 2020-01-12 DIAGNOSIS — N186 End stage renal disease: Secondary | ICD-10-CM | POA: Diagnosis not present

## 2020-01-12 DIAGNOSIS — I5022 Chronic systolic (congestive) heart failure: Secondary | ICD-10-CM | POA: Diagnosis not present

## 2020-01-13 DIAGNOSIS — T8249XA Other complication of vascular dialysis catheter, initial encounter: Secondary | ICD-10-CM | POA: Diagnosis not present

## 2020-01-13 DIAGNOSIS — D509 Iron deficiency anemia, unspecified: Secondary | ICD-10-CM | POA: Diagnosis not present

## 2020-01-13 DIAGNOSIS — D631 Anemia in chronic kidney disease: Secondary | ICD-10-CM | POA: Diagnosis not present

## 2020-01-13 DIAGNOSIS — N186 End stage renal disease: Secondary | ICD-10-CM | POA: Diagnosis not present

## 2020-01-13 DIAGNOSIS — N2581 Secondary hyperparathyroidism of renal origin: Secondary | ICD-10-CM | POA: Diagnosis not present

## 2020-01-15 DIAGNOSIS — D509 Iron deficiency anemia, unspecified: Secondary | ICD-10-CM | POA: Diagnosis not present

## 2020-01-15 DIAGNOSIS — T8249XA Other complication of vascular dialysis catheter, initial encounter: Secondary | ICD-10-CM | POA: Diagnosis not present

## 2020-01-15 DIAGNOSIS — N2581 Secondary hyperparathyroidism of renal origin: Secondary | ICD-10-CM | POA: Diagnosis not present

## 2020-01-15 DIAGNOSIS — N186 End stage renal disease: Secondary | ICD-10-CM | POA: Diagnosis not present

## 2020-01-15 DIAGNOSIS — D631 Anemia in chronic kidney disease: Secondary | ICD-10-CM | POA: Diagnosis not present

## 2020-01-18 DIAGNOSIS — T8249XA Other complication of vascular dialysis catheter, initial encounter: Secondary | ICD-10-CM | POA: Diagnosis not present

## 2020-01-18 DIAGNOSIS — N186 End stage renal disease: Secondary | ICD-10-CM | POA: Diagnosis not present

## 2020-01-18 DIAGNOSIS — D509 Iron deficiency anemia, unspecified: Secondary | ICD-10-CM | POA: Diagnosis not present

## 2020-01-18 DIAGNOSIS — D631 Anemia in chronic kidney disease: Secondary | ICD-10-CM | POA: Diagnosis not present

## 2020-01-18 DIAGNOSIS — N2581 Secondary hyperparathyroidism of renal origin: Secondary | ICD-10-CM | POA: Diagnosis not present

## 2020-01-20 DIAGNOSIS — D631 Anemia in chronic kidney disease: Secondary | ICD-10-CM | POA: Diagnosis not present

## 2020-01-20 DIAGNOSIS — N186 End stage renal disease: Secondary | ICD-10-CM | POA: Diagnosis not present

## 2020-01-20 DIAGNOSIS — T8249XA Other complication of vascular dialysis catheter, initial encounter: Secondary | ICD-10-CM | POA: Diagnosis not present

## 2020-01-20 DIAGNOSIS — D509 Iron deficiency anemia, unspecified: Secondary | ICD-10-CM | POA: Diagnosis not present

## 2020-01-20 DIAGNOSIS — N2581 Secondary hyperparathyroidism of renal origin: Secondary | ICD-10-CM | POA: Diagnosis not present

## 2020-01-21 DIAGNOSIS — N186 End stage renal disease: Secondary | ICD-10-CM | POA: Diagnosis not present

## 2020-01-21 DIAGNOSIS — I739 Peripheral vascular disease, unspecified: Secondary | ICD-10-CM | POA: Diagnosis not present

## 2020-01-21 DIAGNOSIS — G40909 Epilepsy, unspecified, not intractable, without status epilepticus: Secondary | ICD-10-CM | POA: Diagnosis not present

## 2020-01-21 DIAGNOSIS — I69354 Hemiplegia and hemiparesis following cerebral infarction affecting left non-dominant side: Secondary | ICD-10-CM | POA: Diagnosis not present

## 2020-01-22 DIAGNOSIS — T8249XA Other complication of vascular dialysis catheter, initial encounter: Secondary | ICD-10-CM | POA: Diagnosis not present

## 2020-01-22 DIAGNOSIS — N2581 Secondary hyperparathyroidism of renal origin: Secondary | ICD-10-CM | POA: Diagnosis not present

## 2020-01-22 DIAGNOSIS — N186 End stage renal disease: Secondary | ICD-10-CM | POA: Diagnosis not present

## 2020-01-22 DIAGNOSIS — D509 Iron deficiency anemia, unspecified: Secondary | ICD-10-CM | POA: Diagnosis not present

## 2020-01-22 DIAGNOSIS — D631 Anemia in chronic kidney disease: Secondary | ICD-10-CM | POA: Diagnosis not present

## 2020-01-25 DIAGNOSIS — T8249XA Other complication of vascular dialysis catheter, initial encounter: Secondary | ICD-10-CM | POA: Diagnosis not present

## 2020-01-25 DIAGNOSIS — N186 End stage renal disease: Secondary | ICD-10-CM | POA: Diagnosis not present

## 2020-01-25 DIAGNOSIS — D631 Anemia in chronic kidney disease: Secondary | ICD-10-CM | POA: Diagnosis not present

## 2020-01-25 DIAGNOSIS — N2581 Secondary hyperparathyroidism of renal origin: Secondary | ICD-10-CM | POA: Diagnosis not present

## 2020-01-25 DIAGNOSIS — D509 Iron deficiency anemia, unspecified: Secondary | ICD-10-CM | POA: Diagnosis not present

## 2020-01-26 DIAGNOSIS — I739 Peripheral vascular disease, unspecified: Secondary | ICD-10-CM | POA: Diagnosis not present

## 2020-01-26 DIAGNOSIS — N186 End stage renal disease: Secondary | ICD-10-CM | POA: Diagnosis not present

## 2020-01-26 DIAGNOSIS — E119 Type 2 diabetes mellitus without complications: Secondary | ICD-10-CM | POA: Diagnosis not present

## 2020-01-27 DIAGNOSIS — D631 Anemia in chronic kidney disease: Secondary | ICD-10-CM | POA: Diagnosis not present

## 2020-01-27 DIAGNOSIS — T8249XA Other complication of vascular dialysis catheter, initial encounter: Secondary | ICD-10-CM | POA: Diagnosis not present

## 2020-01-27 DIAGNOSIS — D509 Iron deficiency anemia, unspecified: Secondary | ICD-10-CM | POA: Diagnosis not present

## 2020-01-27 DIAGNOSIS — N2581 Secondary hyperparathyroidism of renal origin: Secondary | ICD-10-CM | POA: Diagnosis not present

## 2020-01-27 DIAGNOSIS — N186 End stage renal disease: Secondary | ICD-10-CM | POA: Diagnosis not present

## 2020-01-29 DIAGNOSIS — T8249XA Other complication of vascular dialysis catheter, initial encounter: Secondary | ICD-10-CM | POA: Diagnosis not present

## 2020-01-29 DIAGNOSIS — D631 Anemia in chronic kidney disease: Secondary | ICD-10-CM | POA: Diagnosis not present

## 2020-01-29 DIAGNOSIS — N186 End stage renal disease: Secondary | ICD-10-CM | POA: Diagnosis not present

## 2020-01-29 DIAGNOSIS — N2581 Secondary hyperparathyroidism of renal origin: Secondary | ICD-10-CM | POA: Diagnosis not present

## 2020-01-29 DIAGNOSIS — D509 Iron deficiency anemia, unspecified: Secondary | ICD-10-CM | POA: Diagnosis not present

## 2020-02-01 DIAGNOSIS — N2581 Secondary hyperparathyroidism of renal origin: Secondary | ICD-10-CM | POA: Diagnosis not present

## 2020-02-01 DIAGNOSIS — T8249XA Other complication of vascular dialysis catheter, initial encounter: Secondary | ICD-10-CM | POA: Diagnosis not present

## 2020-02-01 DIAGNOSIS — D631 Anemia in chronic kidney disease: Secondary | ICD-10-CM | POA: Diagnosis not present

## 2020-02-01 DIAGNOSIS — D509 Iron deficiency anemia, unspecified: Secondary | ICD-10-CM | POA: Diagnosis not present

## 2020-02-01 DIAGNOSIS — N186 End stage renal disease: Secondary | ICD-10-CM | POA: Diagnosis not present

## 2020-02-03 DIAGNOSIS — T8249XA Other complication of vascular dialysis catheter, initial encounter: Secondary | ICD-10-CM | POA: Diagnosis not present

## 2020-02-03 DIAGNOSIS — N186 End stage renal disease: Secondary | ICD-10-CM | POA: Diagnosis not present

## 2020-02-03 DIAGNOSIS — D509 Iron deficiency anemia, unspecified: Secondary | ICD-10-CM | POA: Diagnosis not present

## 2020-02-03 DIAGNOSIS — D631 Anemia in chronic kidney disease: Secondary | ICD-10-CM | POA: Diagnosis not present

## 2020-02-03 DIAGNOSIS — N2581 Secondary hyperparathyroidism of renal origin: Secondary | ICD-10-CM | POA: Diagnosis not present

## 2020-02-04 DIAGNOSIS — E559 Vitamin D deficiency, unspecified: Secondary | ICD-10-CM | POA: Diagnosis not present

## 2020-02-04 DIAGNOSIS — G9341 Metabolic encephalopathy: Secondary | ICD-10-CM | POA: Diagnosis present

## 2020-02-04 DIAGNOSIS — J986 Disorders of diaphragm: Secondary | ICD-10-CM | POA: Diagnosis not present

## 2020-02-04 DIAGNOSIS — K920 Hematemesis: Secondary | ICD-10-CM | POA: Diagnosis not present

## 2020-02-04 DIAGNOSIS — R509 Fever, unspecified: Secondary | ICD-10-CM | POA: Diagnosis not present

## 2020-02-04 DIAGNOSIS — R6520 Severe sepsis without septic shock: Secondary | ICD-10-CM | POA: Diagnosis present

## 2020-02-04 DIAGNOSIS — A419 Sepsis, unspecified organism: Secondary | ICD-10-CM | POA: Diagnosis not present

## 2020-02-04 DIAGNOSIS — E119 Type 2 diabetes mellitus without complications: Secondary | ICD-10-CM | POA: Diagnosis not present

## 2020-02-04 DIAGNOSIS — A4189 Other specified sepsis: Secondary | ICD-10-CM | POA: Diagnosis not present

## 2020-02-04 DIAGNOSIS — Z89511 Acquired absence of right leg below knee: Secondary | ICD-10-CM | POA: Diagnosis not present

## 2020-02-04 DIAGNOSIS — I255 Ischemic cardiomyopathy: Secondary | ICD-10-CM | POA: Diagnosis present

## 2020-02-04 DIAGNOSIS — I272 Pulmonary hypertension, unspecified: Secondary | ICD-10-CM | POA: Diagnosis not present

## 2020-02-04 DIAGNOSIS — F339 Major depressive disorder, recurrent, unspecified: Secondary | ICD-10-CM | POA: Diagnosis not present

## 2020-02-04 DIAGNOSIS — D631 Anemia in chronic kidney disease: Secondary | ICD-10-CM | POA: Diagnosis present

## 2020-02-04 DIAGNOSIS — G40909 Epilepsy, unspecified, not intractable, without status epilepticus: Secondary | ICD-10-CM | POA: Diagnosis not present

## 2020-02-04 DIAGNOSIS — I4510 Unspecified right bundle-branch block: Secondary | ICD-10-CM | POA: Diagnosis not present

## 2020-02-04 DIAGNOSIS — I120 Hypertensive chronic kidney disease with stage 5 chronic kidney disease or end stage renal disease: Secondary | ICD-10-CM | POA: Diagnosis not present

## 2020-02-04 DIAGNOSIS — M150 Primary generalized (osteo)arthritis: Secondary | ICD-10-CM | POA: Diagnosis not present

## 2020-02-04 DIAGNOSIS — Z992 Dependence on renal dialysis: Secondary | ICD-10-CM | POA: Diagnosis not present

## 2020-02-04 DIAGNOSIS — I739 Peripheral vascular disease, unspecified: Secondary | ICD-10-CM | POA: Diagnosis not present

## 2020-02-04 DIAGNOSIS — Z20822 Contact with and (suspected) exposure to covid-19: Secondary | ICD-10-CM | POA: Diagnosis not present

## 2020-02-04 DIAGNOSIS — K5900 Constipation, unspecified: Secondary | ICD-10-CM | POA: Diagnosis not present

## 2020-02-04 DIAGNOSIS — R000 Tachycardia, unspecified: Secondary | ICD-10-CM | POA: Diagnosis not present

## 2020-02-04 DIAGNOSIS — Z452 Encounter for adjustment and management of vascular access device: Secondary | ICD-10-CM | POA: Diagnosis not present

## 2020-02-04 DIAGNOSIS — K922 Gastrointestinal hemorrhage, unspecified: Secondary | ICD-10-CM | POA: Diagnosis not present

## 2020-02-04 DIAGNOSIS — R7881 Bacteremia: Secondary | ICD-10-CM | POA: Diagnosis not present

## 2020-02-04 DIAGNOSIS — R1312 Dysphagia, oropharyngeal phase: Secondary | ICD-10-CM | POA: Diagnosis not present

## 2020-02-04 DIAGNOSIS — D696 Thrombocytopenia, unspecified: Secondary | ICD-10-CM | POA: Diagnosis present

## 2020-02-04 DIAGNOSIS — E875 Hyperkalemia: Secondary | ICD-10-CM | POA: Diagnosis not present

## 2020-02-04 DIAGNOSIS — G9340 Encephalopathy, unspecified: Secondary | ICD-10-CM | POA: Diagnosis not present

## 2020-02-04 DIAGNOSIS — I132 Hypertensive heart and chronic kidney disease with heart failure and with stage 5 chronic kidney disease, or end stage renal disease: Secondary | ICD-10-CM | POA: Diagnosis present

## 2020-02-04 DIAGNOSIS — D72829 Elevated white blood cell count, unspecified: Secondary | ICD-10-CM | POA: Diagnosis not present

## 2020-02-04 DIAGNOSIS — J690 Pneumonitis due to inhalation of food and vomit: Secondary | ICD-10-CM | POA: Diagnosis not present

## 2020-02-04 DIAGNOSIS — F0151 Vascular dementia with behavioral disturbance: Secondary | ICD-10-CM | POA: Diagnosis not present

## 2020-02-04 DIAGNOSIS — R1310 Dysphagia, unspecified: Secondary | ICD-10-CM | POA: Diagnosis not present

## 2020-02-04 DIAGNOSIS — Z8673 Personal history of transient ischemic attack (TIA), and cerebral infarction without residual deficits: Secondary | ICD-10-CM | POA: Diagnosis not present

## 2020-02-04 DIAGNOSIS — Z89612 Acquired absence of left leg above knee: Secondary | ICD-10-CM | POA: Diagnosis not present

## 2020-02-04 DIAGNOSIS — I69354 Hemiplegia and hemiparesis following cerebral infarction affecting left non-dominant side: Secondary | ICD-10-CM | POA: Diagnosis not present

## 2020-02-04 DIAGNOSIS — I69954 Hemiplegia and hemiparesis following unspecified cerebrovascular disease affecting left non-dominant side: Secondary | ICD-10-CM | POA: Diagnosis not present

## 2020-02-04 DIAGNOSIS — H16323 Diffuse interstitial keratitis, bilateral: Secondary | ICD-10-CM | POA: Diagnosis not present

## 2020-02-04 DIAGNOSIS — I429 Cardiomyopathy, unspecified: Secondary | ICD-10-CM | POA: Diagnosis not present

## 2020-02-04 DIAGNOSIS — I482 Chronic atrial fibrillation, unspecified: Secondary | ICD-10-CM | POA: Diagnosis not present

## 2020-02-04 DIAGNOSIS — A4102 Sepsis due to Methicillin resistant Staphylococcus aureus: Secondary | ICD-10-CM | POA: Diagnosis not present

## 2020-02-04 DIAGNOSIS — J9611 Chronic respiratory failure with hypoxia: Secondary | ICD-10-CM | POA: Diagnosis present

## 2020-02-04 DIAGNOSIS — M24522 Contracture, left elbow: Secondary | ICD-10-CM | POA: Diagnosis not present

## 2020-02-04 DIAGNOSIS — I639 Cerebral infarction, unspecified: Secondary | ICD-10-CM | POA: Diagnosis not present

## 2020-02-04 DIAGNOSIS — M109 Gout, unspecified: Secondary | ICD-10-CM | POA: Diagnosis not present

## 2020-02-04 DIAGNOSIS — I5022 Chronic systolic (congestive) heart failure: Secondary | ICD-10-CM | POA: Diagnosis present

## 2020-02-04 DIAGNOSIS — I471 Supraventricular tachycardia: Secondary | ICD-10-CM | POA: Diagnosis present

## 2020-02-04 DIAGNOSIS — B957 Other staphylococcus as the cause of diseases classified elsewhere: Secondary | ICD-10-CM | POA: Diagnosis not present

## 2020-02-04 DIAGNOSIS — N186 End stage renal disease: Secondary | ICD-10-CM | POA: Diagnosis present

## 2020-02-04 DIAGNOSIS — E46 Unspecified protein-calorie malnutrition: Secondary | ICD-10-CM | POA: Diagnosis not present

## 2020-02-04 DIAGNOSIS — E1169 Type 2 diabetes mellitus with other specified complication: Secondary | ICD-10-CM | POA: Diagnosis not present

## 2020-02-04 DIAGNOSIS — E1122 Type 2 diabetes mellitus with diabetic chronic kidney disease: Secondary | ICD-10-CM | POA: Diagnosis not present

## 2020-02-04 DIAGNOSIS — E213 Hyperparathyroidism, unspecified: Secondary | ICD-10-CM | POA: Diagnosis present

## 2020-02-04 DIAGNOSIS — A4101 Sepsis due to Methicillin susceptible Staphylococcus aureus: Secondary | ICD-10-CM | POA: Diagnosis not present

## 2020-02-04 DIAGNOSIS — E876 Hypokalemia: Secondary | ICD-10-CM | POA: Diagnosis present

## 2020-02-04 DIAGNOSIS — A411 Sepsis due to other specified staphylococcus: Secondary | ICD-10-CM | POA: Diagnosis present

## 2020-02-04 DIAGNOSIS — Z6841 Body Mass Index (BMI) 40.0 and over, adult: Secondary | ICD-10-CM | POA: Diagnosis not present

## 2020-02-04 DIAGNOSIS — G4700 Insomnia, unspecified: Secondary | ICD-10-CM | POA: Diagnosis not present

## 2020-02-04 DIAGNOSIS — I480 Paroxysmal atrial fibrillation: Secondary | ICD-10-CM | POA: Diagnosis present

## 2020-02-04 DIAGNOSIS — I10 Essential (primary) hypertension: Secondary | ICD-10-CM | POA: Diagnosis not present

## 2020-02-04 DIAGNOSIS — H548 Legal blindness, as defined in USA: Secondary | ICD-10-CM | POA: Diagnosis not present

## 2020-02-04 DIAGNOSIS — F322 Major depressive disorder, single episode, severe without psychotic features: Secondary | ICD-10-CM | POA: Diagnosis not present

## 2020-02-04 DIAGNOSIS — Z79899 Other long term (current) drug therapy: Secondary | ICD-10-CM | POA: Diagnosis not present

## 2020-02-04 DIAGNOSIS — I080 Rheumatic disorders of both mitral and aortic valves: Secondary | ICD-10-CM | POA: Diagnosis not present

## 2020-02-04 DIAGNOSIS — T80211A Bloodstream infection due to central venous catheter, initial encounter: Secondary | ICD-10-CM | POA: Diagnosis not present

## 2020-02-04 DIAGNOSIS — Z515 Encounter for palliative care: Secondary | ICD-10-CM | POA: Diagnosis not present

## 2020-02-04 DIAGNOSIS — E1140 Type 2 diabetes mellitus with diabetic neuropathy, unspecified: Secondary | ICD-10-CM | POA: Diagnosis not present

## 2020-02-10 DIAGNOSIS — N186 End stage renal disease: Secondary | ICD-10-CM | POA: Diagnosis not present

## 2020-02-10 DIAGNOSIS — D631 Anemia in chronic kidney disease: Secondary | ICD-10-CM | POA: Diagnosis not present

## 2020-02-10 DIAGNOSIS — I10 Essential (primary) hypertension: Secondary | ICD-10-CM | POA: Diagnosis not present

## 2020-02-11 DIAGNOSIS — N186 End stage renal disease: Secondary | ICD-10-CM | POA: Diagnosis not present

## 2020-02-11 DIAGNOSIS — M150 Primary generalized (osteo)arthritis: Secondary | ICD-10-CM | POA: Diagnosis not present

## 2020-02-11 DIAGNOSIS — E1140 Type 2 diabetes mellitus with diabetic neuropathy, unspecified: Secondary | ICD-10-CM | POA: Diagnosis not present

## 2020-02-11 DIAGNOSIS — F322 Major depressive disorder, single episode, severe without psychotic features: Secondary | ICD-10-CM | POA: Diagnosis not present

## 2020-02-11 DIAGNOSIS — I10 Essential (primary) hypertension: Secondary | ICD-10-CM | POA: Diagnosis not present

## 2020-02-11 DIAGNOSIS — I5022 Chronic systolic (congestive) heart failure: Secondary | ICD-10-CM | POA: Diagnosis not present

## 2020-02-11 DIAGNOSIS — I132 Hypertensive heart and chronic kidney disease with heart failure and with stage 5 chronic kidney disease, or end stage renal disease: Secondary | ICD-10-CM | POA: Diagnosis not present

## 2020-02-11 DIAGNOSIS — E119 Type 2 diabetes mellitus without complications: Secondary | ICD-10-CM | POA: Diagnosis not present

## 2020-02-11 DIAGNOSIS — E1122 Type 2 diabetes mellitus with diabetic chronic kidney disease: Secondary | ICD-10-CM | POA: Diagnosis not present

## 2020-02-13 DIAGNOSIS — Z9115 Patient's noncompliance with renal dialysis: Secondary | ICD-10-CM | POA: Diagnosis not present

## 2020-02-13 DIAGNOSIS — M150 Primary generalized (osteo)arthritis: Secondary | ICD-10-CM | POA: Diagnosis not present

## 2020-02-13 DIAGNOSIS — F418 Other specified anxiety disorders: Secondary | ICD-10-CM | POA: Diagnosis present

## 2020-02-13 DIAGNOSIS — I272 Pulmonary hypertension, unspecified: Secondary | ICD-10-CM | POA: Diagnosis present

## 2020-02-13 DIAGNOSIS — A4102 Sepsis due to Methicillin resistant Staphylococcus aureus: Secondary | ICD-10-CM | POA: Diagnosis not present

## 2020-02-13 DIAGNOSIS — I509 Heart failure, unspecified: Secondary | ICD-10-CM | POA: Diagnosis not present

## 2020-02-13 DIAGNOSIS — G40901 Epilepsy, unspecified, not intractable, with status epilepticus: Secondary | ICD-10-CM | POA: Diagnosis present

## 2020-02-13 DIAGNOSIS — A4902 Methicillin resistant Staphylococcus aureus infection, unspecified site: Secondary | ICD-10-CM | POA: Diagnosis not present

## 2020-02-13 DIAGNOSIS — I120 Hypertensive chronic kidney disease with stage 5 chronic kidney disease or end stage renal disease: Secondary | ICD-10-CM | POA: Diagnosis not present

## 2020-02-13 DIAGNOSIS — E119 Type 2 diabetes mellitus without complications: Secondary | ICD-10-CM | POA: Diagnosis not present

## 2020-02-13 DIAGNOSIS — Z1612 Extended spectrum beta lactamase (ESBL) resistance: Secondary | ICD-10-CM | POA: Diagnosis present

## 2020-02-13 DIAGNOSIS — N2581 Secondary hyperparathyroidism of renal origin: Secondary | ICD-10-CM | POA: Diagnosis not present

## 2020-02-13 DIAGNOSIS — B958 Unspecified staphylococcus as the cause of diseases classified elsewhere: Secondary | ICD-10-CM | POA: Diagnosis not present

## 2020-02-13 DIAGNOSIS — G4700 Insomnia, unspecified: Secondary | ICD-10-CM | POA: Diagnosis not present

## 2020-02-13 DIAGNOSIS — F339 Major depressive disorder, recurrent, unspecified: Secondary | ICD-10-CM | POA: Diagnosis not present

## 2020-02-13 DIAGNOSIS — E46 Unspecified protein-calorie malnutrition: Secondary | ICD-10-CM | POA: Diagnosis not present

## 2020-02-13 DIAGNOSIS — H16323 Diffuse interstitial keratitis, bilateral: Secondary | ICD-10-CM | POA: Diagnosis not present

## 2020-02-13 DIAGNOSIS — D631 Anemia in chronic kidney disease: Secondary | ICD-10-CM | POA: Diagnosis not present

## 2020-02-13 DIAGNOSIS — H548 Legal blindness, as defined in USA: Secondary | ICD-10-CM | POA: Diagnosis not present

## 2020-02-13 DIAGNOSIS — I69954 Hemiplegia and hemiparesis following unspecified cerebrovascular disease affecting left non-dominant side: Secondary | ICD-10-CM | POA: Diagnosis not present

## 2020-02-13 DIAGNOSIS — Z8673 Personal history of transient ischemic attack (TIA), and cerebral infarction without residual deficits: Secondary | ICD-10-CM | POA: Diagnosis not present

## 2020-02-13 DIAGNOSIS — R6520 Severe sepsis without septic shock: Secondary | ICD-10-CM | POA: Diagnosis not present

## 2020-02-13 DIAGNOSIS — N390 Urinary tract infection, site not specified: Secondary | ICD-10-CM | POA: Diagnosis present

## 2020-02-13 DIAGNOSIS — D509 Iron deficiency anemia, unspecified: Secondary | ICD-10-CM | POA: Diagnosis not present

## 2020-02-13 DIAGNOSIS — R569 Unspecified convulsions: Secondary | ICD-10-CM | POA: Diagnosis not present

## 2020-02-13 DIAGNOSIS — R404 Transient alteration of awareness: Secondary | ICD-10-CM | POA: Diagnosis not present

## 2020-02-13 DIAGNOSIS — I255 Ischemic cardiomyopathy: Secondary | ICD-10-CM | POA: Diagnosis present

## 2020-02-13 DIAGNOSIS — F322 Major depressive disorder, single episode, severe without psychotic features: Secondary | ICD-10-CM | POA: Diagnosis not present

## 2020-02-13 DIAGNOSIS — R7881 Bacteremia: Secondary | ICD-10-CM | POA: Diagnosis not present

## 2020-02-13 DIAGNOSIS — E1142 Type 2 diabetes mellitus with diabetic polyneuropathy: Secondary | ICD-10-CM | POA: Diagnosis present

## 2020-02-13 DIAGNOSIS — J449 Chronic obstructive pulmonary disease, unspecified: Secondary | ICD-10-CM | POA: Diagnosis present

## 2020-02-13 DIAGNOSIS — M109 Gout, unspecified: Secondary | ICD-10-CM | POA: Diagnosis not present

## 2020-02-13 DIAGNOSIS — J9621 Acute and chronic respiratory failure with hypoxia: Secondary | ICD-10-CM | POA: Diagnosis present

## 2020-02-13 DIAGNOSIS — I482 Chronic atrial fibrillation, unspecified: Secondary | ICD-10-CM | POA: Diagnosis not present

## 2020-02-13 DIAGNOSIS — R4020 Unspecified coma: Secondary | ICD-10-CM | POA: Diagnosis not present

## 2020-02-13 DIAGNOSIS — I499 Cardiac arrhythmia, unspecified: Secondary | ICD-10-CM | POA: Diagnosis not present

## 2020-02-13 DIAGNOSIS — Z743 Need for continuous supervision: Secondary | ICD-10-CM | POA: Diagnosis not present

## 2020-02-13 DIAGNOSIS — Z781 Physical restraint status: Secondary | ICD-10-CM | POA: Diagnosis not present

## 2020-02-13 DIAGNOSIS — T8249XA Other complication of vascular dialysis catheter, initial encounter: Secondary | ICD-10-CM | POA: Diagnosis not present

## 2020-02-13 DIAGNOSIS — R5381 Other malaise: Secondary | ICD-10-CM | POA: Diagnosis present

## 2020-02-13 DIAGNOSIS — K5900 Constipation, unspecified: Secondary | ICD-10-CM | POA: Diagnosis not present

## 2020-02-13 DIAGNOSIS — G40909 Epilepsy, unspecified, not intractable, without status epilepticus: Secondary | ICD-10-CM | POA: Diagnosis not present

## 2020-02-13 DIAGNOSIS — G9340 Encephalopathy, unspecified: Secondary | ICD-10-CM | POA: Diagnosis not present

## 2020-02-13 DIAGNOSIS — Z89612 Acquired absence of left leg above knee: Secondary | ICD-10-CM | POA: Diagnosis not present

## 2020-02-13 DIAGNOSIS — A4101 Sepsis due to Methicillin susceptible Staphylococcus aureus: Secondary | ICD-10-CM | POA: Diagnosis not present

## 2020-02-13 DIAGNOSIS — F419 Anxiety disorder, unspecified: Secondary | ICD-10-CM | POA: Diagnosis not present

## 2020-02-13 DIAGNOSIS — G9341 Metabolic encephalopathy: Secondary | ICD-10-CM | POA: Diagnosis present

## 2020-02-13 DIAGNOSIS — G969 Disorder of central nervous system, unspecified: Secondary | ICD-10-CM | POA: Diagnosis not present

## 2020-02-13 DIAGNOSIS — E559 Vitamin D deficiency, unspecified: Secondary | ICD-10-CM | POA: Diagnosis not present

## 2020-02-13 DIAGNOSIS — E875 Hyperkalemia: Secondary | ICD-10-CM | POA: Diagnosis not present

## 2020-02-13 DIAGNOSIS — E1122 Type 2 diabetes mellitus with diabetic chronic kidney disease: Secondary | ICD-10-CM | POA: Diagnosis present

## 2020-02-13 DIAGNOSIS — R1312 Dysphagia, oropharyngeal phase: Secondary | ICD-10-CM | POA: Diagnosis not present

## 2020-02-13 DIAGNOSIS — L89152 Pressure ulcer of sacral region, stage 2: Secondary | ICD-10-CM | POA: Diagnosis present

## 2020-02-13 DIAGNOSIS — Z4682 Encounter for fitting and adjustment of non-vascular catheter: Secondary | ICD-10-CM | POA: Diagnosis not present

## 2020-02-13 DIAGNOSIS — I639 Cerebral infarction, unspecified: Secondary | ICD-10-CM | POA: Diagnosis not present

## 2020-02-13 DIAGNOSIS — Z89511 Acquired absence of right leg below knee: Secondary | ICD-10-CM | POA: Diagnosis not present

## 2020-02-13 DIAGNOSIS — D638 Anemia in other chronic diseases classified elsewhere: Secondary | ICD-10-CM | POA: Diagnosis present

## 2020-02-13 DIAGNOSIS — D696 Thrombocytopenia, unspecified: Secondary | ICD-10-CM | POA: Diagnosis not present

## 2020-02-13 DIAGNOSIS — Z20822 Contact with and (suspected) exposure to covid-19: Secondary | ICD-10-CM | POA: Diagnosis present

## 2020-02-13 DIAGNOSIS — E872 Acidosis: Secondary | ICD-10-CM | POA: Diagnosis present

## 2020-02-13 DIAGNOSIS — G40802 Other epilepsy, not intractable, without status epilepticus: Secondary | ICD-10-CM | POA: Diagnosis not present

## 2020-02-13 DIAGNOSIS — E785 Hyperlipidemia, unspecified: Secondary | ICD-10-CM | POA: Diagnosis present

## 2020-02-13 DIAGNOSIS — E1169 Type 2 diabetes mellitus with other specified complication: Secondary | ICD-10-CM | POA: Diagnosis not present

## 2020-02-13 DIAGNOSIS — E1140 Type 2 diabetes mellitus with diabetic neuropathy, unspecified: Secondary | ICD-10-CM | POA: Diagnosis not present

## 2020-02-13 DIAGNOSIS — I10 Essential (primary) hypertension: Secondary | ICD-10-CM | POA: Diagnosis not present

## 2020-02-13 DIAGNOSIS — M24522 Contracture, left elbow: Secondary | ICD-10-CM | POA: Diagnosis not present

## 2020-02-13 DIAGNOSIS — E441 Mild protein-calorie malnutrition: Secondary | ICD-10-CM | POA: Diagnosis present

## 2020-02-13 DIAGNOSIS — I160 Hypertensive urgency: Secondary | ICD-10-CM | POA: Diagnosis present

## 2020-02-13 DIAGNOSIS — I429 Cardiomyopathy, unspecified: Secondary | ICD-10-CM | POA: Diagnosis not present

## 2020-02-13 DIAGNOSIS — E8889 Other specified metabolic disorders: Secondary | ICD-10-CM | POA: Diagnosis not present

## 2020-02-13 DIAGNOSIS — G4089 Other seizures: Secondary | ICD-10-CM | POA: Diagnosis not present

## 2020-02-13 DIAGNOSIS — Z992 Dependence on renal dialysis: Secondary | ICD-10-CM | POA: Diagnosis not present

## 2020-02-13 DIAGNOSIS — E1151 Type 2 diabetes mellitus with diabetic peripheral angiopathy without gangrene: Secondary | ICD-10-CM | POA: Diagnosis present

## 2020-02-13 DIAGNOSIS — I69354 Hemiplegia and hemiparesis following cerebral infarction affecting left non-dominant side: Secondary | ICD-10-CM | POA: Diagnosis not present

## 2020-02-13 DIAGNOSIS — I5022 Chronic systolic (congestive) heart failure: Secondary | ICD-10-CM | POA: Diagnosis not present

## 2020-02-13 DIAGNOSIS — I739 Peripheral vascular disease, unspecified: Secondary | ICD-10-CM | POA: Diagnosis not present

## 2020-02-13 DIAGNOSIS — I132 Hypertensive heart and chronic kidney disease with heart failure and with stage 5 chronic kidney disease, or end stage renal disease: Secondary | ICD-10-CM | POA: Diagnosis present

## 2020-02-13 DIAGNOSIS — I5042 Chronic combined systolic (congestive) and diastolic (congestive) heart failure: Secondary | ICD-10-CM | POA: Diagnosis present

## 2020-02-13 DIAGNOSIS — N186 End stage renal disease: Secondary | ICD-10-CM | POA: Diagnosis present

## 2020-02-15 DIAGNOSIS — N186 End stage renal disease: Secondary | ICD-10-CM | POA: Diagnosis not present

## 2020-02-15 DIAGNOSIS — A4902 Methicillin resistant Staphylococcus aureus infection, unspecified site: Secondary | ICD-10-CM | POA: Diagnosis not present

## 2020-02-15 DIAGNOSIS — D509 Iron deficiency anemia, unspecified: Secondary | ICD-10-CM | POA: Diagnosis not present

## 2020-02-15 DIAGNOSIS — D631 Anemia in chronic kidney disease: Secondary | ICD-10-CM | POA: Diagnosis not present

## 2020-02-15 DIAGNOSIS — N2581 Secondary hyperparathyroidism of renal origin: Secondary | ICD-10-CM | POA: Diagnosis not present

## 2020-02-16 DIAGNOSIS — A4102 Sepsis due to Methicillin resistant Staphylococcus aureus: Secondary | ICD-10-CM | POA: Diagnosis not present

## 2020-02-16 DIAGNOSIS — I5022 Chronic systolic (congestive) heart failure: Secondary | ICD-10-CM | POA: Diagnosis not present

## 2020-02-16 DIAGNOSIS — N186 End stage renal disease: Secondary | ICD-10-CM | POA: Diagnosis not present

## 2020-02-16 DIAGNOSIS — G40909 Epilepsy, unspecified, not intractable, without status epilepticus: Secondary | ICD-10-CM | POA: Diagnosis not present

## 2020-02-17 DIAGNOSIS — N186 End stage renal disease: Secondary | ICD-10-CM | POA: Diagnosis not present

## 2020-02-17 DIAGNOSIS — D509 Iron deficiency anemia, unspecified: Secondary | ICD-10-CM | POA: Diagnosis not present

## 2020-02-17 DIAGNOSIS — D631 Anemia in chronic kidney disease: Secondary | ICD-10-CM | POA: Diagnosis not present

## 2020-02-17 DIAGNOSIS — N2581 Secondary hyperparathyroidism of renal origin: Secondary | ICD-10-CM | POA: Diagnosis not present

## 2020-02-17 DIAGNOSIS — A4902 Methicillin resistant Staphylococcus aureus infection, unspecified site: Secondary | ICD-10-CM | POA: Diagnosis not present

## 2020-02-19 DIAGNOSIS — A4902 Methicillin resistant Staphylococcus aureus infection, unspecified site: Secondary | ICD-10-CM | POA: Diagnosis not present

## 2020-02-19 DIAGNOSIS — N186 End stage renal disease: Secondary | ICD-10-CM | POA: Diagnosis not present

## 2020-02-19 DIAGNOSIS — D631 Anemia in chronic kidney disease: Secondary | ICD-10-CM | POA: Diagnosis not present

## 2020-02-19 DIAGNOSIS — D509 Iron deficiency anemia, unspecified: Secondary | ICD-10-CM | POA: Diagnosis not present

## 2020-02-19 DIAGNOSIS — N2581 Secondary hyperparathyroidism of renal origin: Secondary | ICD-10-CM | POA: Diagnosis not present

## 2020-02-22 DIAGNOSIS — D509 Iron deficiency anemia, unspecified: Secondary | ICD-10-CM | POA: Diagnosis not present

## 2020-02-22 DIAGNOSIS — A4902 Methicillin resistant Staphylococcus aureus infection, unspecified site: Secondary | ICD-10-CM | POA: Diagnosis not present

## 2020-02-22 DIAGNOSIS — N2581 Secondary hyperparathyroidism of renal origin: Secondary | ICD-10-CM | POA: Diagnosis not present

## 2020-02-22 DIAGNOSIS — N186 End stage renal disease: Secondary | ICD-10-CM | POA: Diagnosis not present

## 2020-02-22 DIAGNOSIS — D631 Anemia in chronic kidney disease: Secondary | ICD-10-CM | POA: Diagnosis not present

## 2020-02-24 DIAGNOSIS — N186 End stage renal disease: Secondary | ICD-10-CM | POA: Diagnosis not present

## 2020-02-24 DIAGNOSIS — N2581 Secondary hyperparathyroidism of renal origin: Secondary | ICD-10-CM | POA: Diagnosis not present

## 2020-02-24 DIAGNOSIS — D509 Iron deficiency anemia, unspecified: Secondary | ICD-10-CM | POA: Diagnosis not present

## 2020-02-24 DIAGNOSIS — D631 Anemia in chronic kidney disease: Secondary | ICD-10-CM | POA: Diagnosis not present

## 2020-02-24 DIAGNOSIS — A4902 Methicillin resistant Staphylococcus aureus infection, unspecified site: Secondary | ICD-10-CM | POA: Diagnosis not present

## 2020-02-26 DIAGNOSIS — N2581 Secondary hyperparathyroidism of renal origin: Secondary | ICD-10-CM | POA: Diagnosis not present

## 2020-02-26 DIAGNOSIS — A4902 Methicillin resistant Staphylococcus aureus infection, unspecified site: Secondary | ICD-10-CM | POA: Diagnosis not present

## 2020-02-26 DIAGNOSIS — D631 Anemia in chronic kidney disease: Secondary | ICD-10-CM | POA: Diagnosis not present

## 2020-02-26 DIAGNOSIS — D509 Iron deficiency anemia, unspecified: Secondary | ICD-10-CM | POA: Diagnosis not present

## 2020-02-26 DIAGNOSIS — N186 End stage renal disease: Secondary | ICD-10-CM | POA: Diagnosis not present

## 2020-02-29 DIAGNOSIS — N186 End stage renal disease: Secondary | ICD-10-CM | POA: Diagnosis not present

## 2020-02-29 DIAGNOSIS — A4902 Methicillin resistant Staphylococcus aureus infection, unspecified site: Secondary | ICD-10-CM | POA: Diagnosis not present

## 2020-02-29 DIAGNOSIS — N2581 Secondary hyperparathyroidism of renal origin: Secondary | ICD-10-CM | POA: Diagnosis not present

## 2020-02-29 DIAGNOSIS — D631 Anemia in chronic kidney disease: Secondary | ICD-10-CM | POA: Diagnosis not present

## 2020-02-29 DIAGNOSIS — D509 Iron deficiency anemia, unspecified: Secondary | ICD-10-CM | POA: Diagnosis not present

## 2020-03-02 DIAGNOSIS — A4902 Methicillin resistant Staphylococcus aureus infection, unspecified site: Secondary | ICD-10-CM | POA: Diagnosis not present

## 2020-03-02 DIAGNOSIS — N2581 Secondary hyperparathyroidism of renal origin: Secondary | ICD-10-CM | POA: Diagnosis not present

## 2020-03-02 DIAGNOSIS — N186 End stage renal disease: Secondary | ICD-10-CM | POA: Diagnosis not present

## 2020-03-02 DIAGNOSIS — D631 Anemia in chronic kidney disease: Secondary | ICD-10-CM | POA: Diagnosis not present

## 2020-03-02 DIAGNOSIS — D509 Iron deficiency anemia, unspecified: Secondary | ICD-10-CM | POA: Diagnosis not present

## 2020-03-04 DIAGNOSIS — A4902 Methicillin resistant Staphylococcus aureus infection, unspecified site: Secondary | ICD-10-CM | POA: Diagnosis not present

## 2020-03-04 DIAGNOSIS — D509 Iron deficiency anemia, unspecified: Secondary | ICD-10-CM | POA: Diagnosis not present

## 2020-03-04 DIAGNOSIS — D631 Anemia in chronic kidney disease: Secondary | ICD-10-CM | POA: Diagnosis not present

## 2020-03-04 DIAGNOSIS — N186 End stage renal disease: Secondary | ICD-10-CM | POA: Diagnosis not present

## 2020-03-04 DIAGNOSIS — N2581 Secondary hyperparathyroidism of renal origin: Secondary | ICD-10-CM | POA: Diagnosis not present

## 2020-03-07 DIAGNOSIS — N2581 Secondary hyperparathyroidism of renal origin: Secondary | ICD-10-CM | POA: Diagnosis not present

## 2020-03-07 DIAGNOSIS — D631 Anemia in chronic kidney disease: Secondary | ICD-10-CM | POA: Diagnosis not present

## 2020-03-07 DIAGNOSIS — A4902 Methicillin resistant Staphylococcus aureus infection, unspecified site: Secondary | ICD-10-CM | POA: Diagnosis not present

## 2020-03-07 DIAGNOSIS — D509 Iron deficiency anemia, unspecified: Secondary | ICD-10-CM | POA: Diagnosis not present

## 2020-03-07 DIAGNOSIS — N186 End stage renal disease: Secondary | ICD-10-CM | POA: Diagnosis not present

## 2020-03-08 DIAGNOSIS — I10 Essential (primary) hypertension: Secondary | ICD-10-CM | POA: Diagnosis not present

## 2020-03-08 DIAGNOSIS — E1122 Type 2 diabetes mellitus with diabetic chronic kidney disease: Secondary | ICD-10-CM | POA: Diagnosis not present

## 2020-03-08 DIAGNOSIS — G40909 Epilepsy, unspecified, not intractable, without status epilepticus: Secondary | ICD-10-CM | POA: Diagnosis not present

## 2020-03-08 DIAGNOSIS — N186 End stage renal disease: Secondary | ICD-10-CM | POA: Diagnosis not present

## 2020-03-08 DIAGNOSIS — I739 Peripheral vascular disease, unspecified: Secondary | ICD-10-CM | POA: Diagnosis not present

## 2020-03-10 DIAGNOSIS — F419 Anxiety disorder, unspecified: Secondary | ICD-10-CM | POA: Diagnosis not present

## 2020-03-11 DIAGNOSIS — T8249XA Other complication of vascular dialysis catheter, initial encounter: Secondary | ICD-10-CM | POA: Diagnosis not present

## 2020-03-11 DIAGNOSIS — D509 Iron deficiency anemia, unspecified: Secondary | ICD-10-CM | POA: Diagnosis not present

## 2020-03-11 DIAGNOSIS — N186 End stage renal disease: Secondary | ICD-10-CM | POA: Diagnosis not present

## 2020-03-11 DIAGNOSIS — I10 Essential (primary) hypertension: Secondary | ICD-10-CM | POA: Diagnosis not present

## 2020-03-11 DIAGNOSIS — D631 Anemia in chronic kidney disease: Secondary | ICD-10-CM | POA: Diagnosis not present

## 2020-03-11 DIAGNOSIS — N2581 Secondary hyperparathyroidism of renal origin: Secondary | ICD-10-CM | POA: Diagnosis not present

## 2020-03-14 DIAGNOSIS — D509 Iron deficiency anemia, unspecified: Secondary | ICD-10-CM | POA: Diagnosis not present

## 2020-03-14 DIAGNOSIS — N2581 Secondary hyperparathyroidism of renal origin: Secondary | ICD-10-CM | POA: Diagnosis not present

## 2020-03-14 DIAGNOSIS — T8249XA Other complication of vascular dialysis catheter, initial encounter: Secondary | ICD-10-CM | POA: Diagnosis not present

## 2020-03-14 DIAGNOSIS — N186 End stage renal disease: Secondary | ICD-10-CM | POA: Diagnosis not present

## 2020-03-14 DIAGNOSIS — D631 Anemia in chronic kidney disease: Secondary | ICD-10-CM | POA: Diagnosis not present

## 2020-03-15 DIAGNOSIS — I132 Hypertensive heart and chronic kidney disease with heart failure and with stage 5 chronic kidney disease, or end stage renal disease: Secondary | ICD-10-CM | POA: Diagnosis not present

## 2020-03-15 DIAGNOSIS — E1122 Type 2 diabetes mellitus with diabetic chronic kidney disease: Secondary | ICD-10-CM | POA: Diagnosis not present

## 2020-03-15 DIAGNOSIS — I10 Essential (primary) hypertension: Secondary | ICD-10-CM | POA: Diagnosis not present

## 2020-03-15 DIAGNOSIS — B958 Unspecified staphylococcus as the cause of diseases classified elsewhere: Secondary | ICD-10-CM | POA: Diagnosis not present

## 2020-03-15 DIAGNOSIS — R7881 Bacteremia: Secondary | ICD-10-CM | POA: Diagnosis not present

## 2020-03-15 DIAGNOSIS — N186 End stage renal disease: Secondary | ICD-10-CM | POA: Diagnosis not present

## 2020-03-15 DIAGNOSIS — M150 Primary generalized (osteo)arthritis: Secondary | ICD-10-CM | POA: Diagnosis not present

## 2020-03-15 DIAGNOSIS — E1140 Type 2 diabetes mellitus with diabetic neuropathy, unspecified: Secondary | ICD-10-CM | POA: Diagnosis not present

## 2020-03-15 DIAGNOSIS — I5022 Chronic systolic (congestive) heart failure: Secondary | ICD-10-CM | POA: Diagnosis not present

## 2020-03-15 DIAGNOSIS — E119 Type 2 diabetes mellitus without complications: Secondary | ICD-10-CM | POA: Diagnosis not present

## 2020-03-15 DIAGNOSIS — F322 Major depressive disorder, single episode, severe without psychotic features: Secondary | ICD-10-CM | POA: Diagnosis not present

## 2020-03-16 DIAGNOSIS — N186 End stage renal disease: Secondary | ICD-10-CM | POA: Diagnosis not present

## 2020-03-16 DIAGNOSIS — D631 Anemia in chronic kidney disease: Secondary | ICD-10-CM | POA: Diagnosis not present

## 2020-03-16 DIAGNOSIS — F419 Anxiety disorder, unspecified: Secondary | ICD-10-CM | POA: Diagnosis not present

## 2020-03-16 DIAGNOSIS — N2581 Secondary hyperparathyroidism of renal origin: Secondary | ICD-10-CM | POA: Diagnosis not present

## 2020-03-16 DIAGNOSIS — D509 Iron deficiency anemia, unspecified: Secondary | ICD-10-CM | POA: Diagnosis not present

## 2020-03-16 DIAGNOSIS — I5022 Chronic systolic (congestive) heart failure: Secondary | ICD-10-CM | POA: Diagnosis not present

## 2020-03-16 DIAGNOSIS — I132 Hypertensive heart and chronic kidney disease with heart failure and with stage 5 chronic kidney disease, or end stage renal disease: Secondary | ICD-10-CM | POA: Diagnosis not present

## 2020-03-16 DIAGNOSIS — T8249XA Other complication of vascular dialysis catheter, initial encounter: Secondary | ICD-10-CM | POA: Diagnosis not present

## 2020-03-18 DIAGNOSIS — T8249XA Other complication of vascular dialysis catheter, initial encounter: Secondary | ICD-10-CM | POA: Diagnosis not present

## 2020-03-18 DIAGNOSIS — D631 Anemia in chronic kidney disease: Secondary | ICD-10-CM | POA: Diagnosis not present

## 2020-03-18 DIAGNOSIS — D509 Iron deficiency anemia, unspecified: Secondary | ICD-10-CM | POA: Diagnosis not present

## 2020-03-18 DIAGNOSIS — N186 End stage renal disease: Secondary | ICD-10-CM | POA: Diagnosis not present

## 2020-03-18 DIAGNOSIS — N2581 Secondary hyperparathyroidism of renal origin: Secondary | ICD-10-CM | POA: Diagnosis not present

## 2020-03-20 DIAGNOSIS — I10 Essential (primary) hypertension: Secondary | ICD-10-CM | POA: Diagnosis not present

## 2020-03-20 DIAGNOSIS — I739 Peripheral vascular disease, unspecified: Secondary | ICD-10-CM | POA: Diagnosis not present

## 2020-03-20 DIAGNOSIS — E1122 Type 2 diabetes mellitus with diabetic chronic kidney disease: Secondary | ICD-10-CM | POA: Diagnosis not present

## 2020-03-23 DIAGNOSIS — T8249XA Other complication of vascular dialysis catheter, initial encounter: Secondary | ICD-10-CM | POA: Diagnosis not present

## 2020-03-23 DIAGNOSIS — N2581 Secondary hyperparathyroidism of renal origin: Secondary | ICD-10-CM | POA: Diagnosis not present

## 2020-03-23 DIAGNOSIS — D509 Iron deficiency anemia, unspecified: Secondary | ICD-10-CM | POA: Diagnosis not present

## 2020-03-23 DIAGNOSIS — D631 Anemia in chronic kidney disease: Secondary | ICD-10-CM | POA: Diagnosis not present

## 2020-03-23 DIAGNOSIS — N186 End stage renal disease: Secondary | ICD-10-CM | POA: Diagnosis not present

## 2020-03-25 DIAGNOSIS — D509 Iron deficiency anemia, unspecified: Secondary | ICD-10-CM | POA: Diagnosis not present

## 2020-03-25 DIAGNOSIS — D631 Anemia in chronic kidney disease: Secondary | ICD-10-CM | POA: Diagnosis not present

## 2020-03-25 DIAGNOSIS — N186 End stage renal disease: Secondary | ICD-10-CM | POA: Diagnosis not present

## 2020-03-25 DIAGNOSIS — N2581 Secondary hyperparathyroidism of renal origin: Secondary | ICD-10-CM | POA: Diagnosis not present

## 2020-03-25 DIAGNOSIS — T8249XA Other complication of vascular dialysis catheter, initial encounter: Secondary | ICD-10-CM | POA: Diagnosis not present

## 2020-03-27 DIAGNOSIS — J9691 Respiratory failure, unspecified with hypoxia: Secondary | ICD-10-CM | POA: Diagnosis not present

## 2020-03-27 DIAGNOSIS — I69954 Hemiplegia and hemiparesis following unspecified cerebrovascular disease affecting left non-dominant side: Secondary | ICD-10-CM | POA: Diagnosis not present

## 2020-03-27 DIAGNOSIS — E1140 Type 2 diabetes mellitus with diabetic neuropathy, unspecified: Secondary | ICD-10-CM | POA: Diagnosis not present

## 2020-03-27 DIAGNOSIS — Z781 Physical restraint status: Secondary | ICD-10-CM | POA: Diagnosis not present

## 2020-03-27 DIAGNOSIS — M24542 Contracture, left hand: Secondary | ICD-10-CM | POA: Diagnosis not present

## 2020-03-27 DIAGNOSIS — Z4682 Encounter for fitting and adjustment of non-vascular catheter: Secondary | ICD-10-CM | POA: Diagnosis not present

## 2020-03-27 DIAGNOSIS — I499 Cardiac arrhythmia, unspecified: Secondary | ICD-10-CM | POA: Diagnosis not present

## 2020-03-27 DIAGNOSIS — G4089 Other seizures: Secondary | ICD-10-CM | POA: Diagnosis not present

## 2020-03-27 DIAGNOSIS — E1151 Type 2 diabetes mellitus with diabetic peripheral angiopathy without gangrene: Secondary | ICD-10-CM | POA: Diagnosis present

## 2020-03-27 DIAGNOSIS — I160 Hypertensive urgency: Secondary | ICD-10-CM | POA: Diagnosis not present

## 2020-03-27 DIAGNOSIS — G969 Disorder of central nervous system, unspecified: Secondary | ICD-10-CM | POA: Diagnosis not present

## 2020-03-27 DIAGNOSIS — E119 Type 2 diabetes mellitus without complications: Secondary | ICD-10-CM | POA: Diagnosis not present

## 2020-03-27 DIAGNOSIS — H548 Legal blindness, as defined in USA: Secondary | ICD-10-CM | POA: Diagnosis not present

## 2020-03-27 DIAGNOSIS — R1312 Dysphagia, oropharyngeal phase: Secondary | ICD-10-CM | POA: Diagnosis not present

## 2020-03-27 DIAGNOSIS — R569 Unspecified convulsions: Secondary | ICD-10-CM | POA: Diagnosis not present

## 2020-03-27 DIAGNOSIS — Z9115 Patient's noncompliance with renal dialysis: Secondary | ICD-10-CM | POA: Diagnosis not present

## 2020-03-27 DIAGNOSIS — N390 Urinary tract infection, site not specified: Secondary | ICD-10-CM | POA: Diagnosis not present

## 2020-03-27 DIAGNOSIS — I739 Peripheral vascular disease, unspecified: Secondary | ICD-10-CM | POA: Diagnosis not present

## 2020-03-27 DIAGNOSIS — J690 Pneumonitis due to inhalation of food and vomit: Secondary | ICD-10-CM | POA: Diagnosis not present

## 2020-03-27 DIAGNOSIS — Z9981 Dependence on supplemental oxygen: Secondary | ICD-10-CM | POA: Diagnosis not present

## 2020-03-27 DIAGNOSIS — I120 Hypertensive chronic kidney disease with stage 5 chronic kidney disease or end stage renal disease: Secondary | ICD-10-CM | POA: Diagnosis not present

## 2020-03-27 DIAGNOSIS — G40901 Epilepsy, unspecified, not intractable, with status epilepticus: Secondary | ICD-10-CM | POA: Diagnosis not present

## 2020-03-27 DIAGNOSIS — I132 Hypertensive heart and chronic kidney disease with heart failure and with stage 5 chronic kidney disease, or end stage renal disease: Secondary | ICD-10-CM | POA: Diagnosis not present

## 2020-03-27 DIAGNOSIS — D638 Anemia in other chronic diseases classified elsewhere: Secondary | ICD-10-CM | POA: Diagnosis present

## 2020-03-27 DIAGNOSIS — I69354 Hemiplegia and hemiparesis following cerebral infarction affecting left non-dominant side: Secondary | ICD-10-CM | POA: Diagnosis not present

## 2020-03-27 DIAGNOSIS — M150 Primary generalized (osteo)arthritis: Secondary | ICD-10-CM | POA: Diagnosis not present

## 2020-03-27 DIAGNOSIS — N186 End stage renal disease: Secondary | ICD-10-CM | POA: Diagnosis not present

## 2020-03-27 DIAGNOSIS — F418 Other specified anxiety disorders: Secondary | ICD-10-CM | POA: Diagnosis present

## 2020-03-27 DIAGNOSIS — I272 Pulmonary hypertension, unspecified: Secondary | ICD-10-CM | POA: Diagnosis present

## 2020-03-27 DIAGNOSIS — A419 Sepsis, unspecified organism: Secondary | ICD-10-CM | POA: Diagnosis not present

## 2020-03-27 DIAGNOSIS — E441 Mild protein-calorie malnutrition: Secondary | ICD-10-CM | POA: Diagnosis present

## 2020-03-27 DIAGNOSIS — M24522 Contracture, left elbow: Secondary | ICD-10-CM | POA: Diagnosis not present

## 2020-03-27 DIAGNOSIS — Z8673 Personal history of transient ischemic attack (TIA), and cerebral infarction without residual deficits: Secondary | ICD-10-CM | POA: Diagnosis not present

## 2020-03-27 DIAGNOSIS — Z992 Dependence on renal dialysis: Secondary | ICD-10-CM | POA: Diagnosis not present

## 2020-03-27 DIAGNOSIS — I150 Renovascular hypertension: Secondary | ICD-10-CM | POA: Diagnosis not present

## 2020-03-27 DIAGNOSIS — B9620 Unspecified Escherichia coli [E. coli] as the cause of diseases classified elsewhere: Secondary | ICD-10-CM | POA: Diagnosis not present

## 2020-03-27 DIAGNOSIS — G40802 Other epilepsy, not intractable, without status epilepticus: Secondary | ICD-10-CM | POA: Diagnosis not present

## 2020-03-27 DIAGNOSIS — D631 Anemia in chronic kidney disease: Secondary | ICD-10-CM | POA: Diagnosis not present

## 2020-03-27 DIAGNOSIS — Z1612 Extended spectrum beta lactamase (ESBL) resistance: Secondary | ICD-10-CM | POA: Diagnosis present

## 2020-03-27 DIAGNOSIS — J449 Chronic obstructive pulmonary disease, unspecified: Secondary | ICD-10-CM | POA: Diagnosis not present

## 2020-03-27 DIAGNOSIS — Z794 Long term (current) use of insulin: Secondary | ICD-10-CM | POA: Diagnosis not present

## 2020-03-27 DIAGNOSIS — R404 Transient alteration of awareness: Secondary | ICD-10-CM | POA: Diagnosis not present

## 2020-03-27 DIAGNOSIS — H16323 Diffuse interstitial keratitis, bilateral: Secondary | ICD-10-CM | POA: Diagnosis not present

## 2020-03-27 DIAGNOSIS — I080 Rheumatic disorders of both mitral and aortic valves: Secondary | ICD-10-CM | POA: Diagnosis not present

## 2020-03-27 DIAGNOSIS — J9601 Acute respiratory failure with hypoxia: Secondary | ICD-10-CM | POA: Diagnosis not present

## 2020-03-27 DIAGNOSIS — R1310 Dysphagia, unspecified: Secondary | ICD-10-CM | POA: Diagnosis not present

## 2020-03-27 DIAGNOSIS — N250 Renal osteodystrophy: Secondary | ICD-10-CM | POA: Diagnosis not present

## 2020-03-27 DIAGNOSIS — E46 Unspecified protein-calorie malnutrition: Secondary | ICD-10-CM | POA: Diagnosis not present

## 2020-03-27 DIAGNOSIS — I482 Chronic atrial fibrillation, unspecified: Secondary | ICD-10-CM | POA: Diagnosis not present

## 2020-03-27 DIAGNOSIS — Z20822 Contact with and (suspected) exposure to covid-19: Secondary | ICD-10-CM | POA: Diagnosis present

## 2020-03-27 DIAGNOSIS — E1142 Type 2 diabetes mellitus with diabetic polyneuropathy: Secondary | ICD-10-CM | POA: Diagnosis present

## 2020-03-27 DIAGNOSIS — E8889 Other specified metabolic disorders: Secondary | ICD-10-CM | POA: Diagnosis not present

## 2020-03-27 DIAGNOSIS — G4700 Insomnia, unspecified: Secondary | ICD-10-CM | POA: Diagnosis not present

## 2020-03-27 DIAGNOSIS — L89152 Pressure ulcer of sacral region, stage 2: Secondary | ICD-10-CM | POA: Diagnosis present

## 2020-03-27 DIAGNOSIS — I255 Ischemic cardiomyopathy: Secondary | ICD-10-CM | POA: Diagnosis present

## 2020-03-27 DIAGNOSIS — G9341 Metabolic encephalopathy: Secondary | ICD-10-CM | POA: Diagnosis not present

## 2020-03-27 DIAGNOSIS — J9611 Chronic respiratory failure with hypoxia: Secondary | ICD-10-CM | POA: Diagnosis not present

## 2020-03-27 DIAGNOSIS — E1169 Type 2 diabetes mellitus with other specified complication: Secondary | ICD-10-CM | POA: Diagnosis not present

## 2020-03-27 DIAGNOSIS — J9621 Acute and chronic respiratory failure with hypoxia: Secondary | ICD-10-CM | POA: Diagnosis not present

## 2020-03-27 DIAGNOSIS — L89322 Pressure ulcer of left buttock, stage 2: Secondary | ICD-10-CM | POA: Diagnosis not present

## 2020-03-27 DIAGNOSIS — E785 Hyperlipidemia, unspecified: Secondary | ICD-10-CM | POA: Diagnosis present

## 2020-03-27 DIAGNOSIS — G40909 Epilepsy, unspecified, not intractable, without status epilepticus: Secondary | ICD-10-CM | POA: Diagnosis not present

## 2020-03-27 DIAGNOSIS — E1122 Type 2 diabetes mellitus with diabetic chronic kidney disease: Secondary | ICD-10-CM | POA: Diagnosis not present

## 2020-03-27 DIAGNOSIS — I4891 Unspecified atrial fibrillation: Secondary | ICD-10-CM | POA: Diagnosis not present

## 2020-03-27 DIAGNOSIS — B964 Proteus (mirabilis) (morganii) as the cause of diseases classified elsewhere: Secondary | ICD-10-CM | POA: Diagnosis not present

## 2020-03-27 DIAGNOSIS — F339 Major depressive disorder, recurrent, unspecified: Secondary | ICD-10-CM | POA: Diagnosis not present

## 2020-03-27 DIAGNOSIS — D696 Thrombocytopenia, unspecified: Secondary | ICD-10-CM | POA: Diagnosis not present

## 2020-03-27 DIAGNOSIS — G40801 Other epilepsy, not intractable, with status epilepticus: Secondary | ICD-10-CM | POA: Diagnosis not present

## 2020-03-27 DIAGNOSIS — E875 Hyperkalemia: Secondary | ICD-10-CM | POA: Diagnosis not present

## 2020-03-27 DIAGNOSIS — I429 Cardiomyopathy, unspecified: Secondary | ICD-10-CM | POA: Diagnosis not present

## 2020-03-27 DIAGNOSIS — Z89511 Acquired absence of right leg below knee: Secondary | ICD-10-CM | POA: Diagnosis not present

## 2020-03-27 DIAGNOSIS — R5381 Other malaise: Secondary | ICD-10-CM | POA: Diagnosis present

## 2020-03-27 DIAGNOSIS — I444 Left anterior fascicular block: Secondary | ICD-10-CM | POA: Diagnosis not present

## 2020-03-27 DIAGNOSIS — E559 Vitamin D deficiency, unspecified: Secondary | ICD-10-CM | POA: Diagnosis not present

## 2020-03-27 DIAGNOSIS — Z515 Encounter for palliative care: Secondary | ICD-10-CM | POA: Diagnosis not present

## 2020-03-27 DIAGNOSIS — I5022 Chronic systolic (congestive) heart failure: Secondary | ICD-10-CM | POA: Diagnosis not present

## 2020-03-27 DIAGNOSIS — A4101 Sepsis due to Methicillin susceptible Staphylococcus aureus: Secondary | ICD-10-CM | POA: Diagnosis not present

## 2020-03-27 DIAGNOSIS — Z89612 Acquired absence of left leg above knee: Secondary | ICD-10-CM | POA: Diagnosis not present

## 2020-03-27 DIAGNOSIS — I509 Heart failure, unspecified: Secondary | ICD-10-CM | POA: Diagnosis not present

## 2020-03-27 DIAGNOSIS — I5042 Chronic combined systolic (congestive) and diastolic (congestive) heart failure: Secondary | ICD-10-CM | POA: Diagnosis present

## 2020-03-27 DIAGNOSIS — E872 Acidosis: Secondary | ICD-10-CM | POA: Diagnosis not present

## 2020-03-27 DIAGNOSIS — I10 Essential (primary) hypertension: Secondary | ICD-10-CM | POA: Diagnosis not present

## 2020-03-27 DIAGNOSIS — Z743 Need for continuous supervision: Secondary | ICD-10-CM | POA: Diagnosis not present

## 2020-03-27 DIAGNOSIS — F322 Major depressive disorder, single episode, severe without psychotic features: Secondary | ICD-10-CM | POA: Diagnosis not present

## 2020-03-27 DIAGNOSIS — R4020 Unspecified coma: Secondary | ICD-10-CM | POA: Diagnosis not present

## 2020-04-11 DIAGNOSIS — I10 Essential (primary) hypertension: Secondary | ICD-10-CM | POA: Diagnosis not present

## 2020-04-11 DIAGNOSIS — D631 Anemia in chronic kidney disease: Secondary | ICD-10-CM | POA: Diagnosis not present

## 2020-04-11 DIAGNOSIS — N186 End stage renal disease: Secondary | ICD-10-CM | POA: Diagnosis not present

## 2020-04-12 DIAGNOSIS — M150 Primary generalized (osteo)arthritis: Secondary | ICD-10-CM | POA: Diagnosis not present

## 2020-04-12 DIAGNOSIS — I5022 Chronic systolic (congestive) heart failure: Secondary | ICD-10-CM | POA: Diagnosis not present

## 2020-04-12 DIAGNOSIS — I10 Essential (primary) hypertension: Secondary | ICD-10-CM | POA: Diagnosis not present

## 2020-04-12 DIAGNOSIS — F322 Major depressive disorder, single episode, severe without psychotic features: Secondary | ICD-10-CM | POA: Diagnosis not present

## 2020-04-12 DIAGNOSIS — I132 Hypertensive heart and chronic kidney disease with heart failure and with stage 5 chronic kidney disease, or end stage renal disease: Secondary | ICD-10-CM | POA: Diagnosis not present

## 2020-04-12 DIAGNOSIS — E119 Type 2 diabetes mellitus without complications: Secondary | ICD-10-CM | POA: Diagnosis not present

## 2020-04-12 DIAGNOSIS — E1122 Type 2 diabetes mellitus with diabetic chronic kidney disease: Secondary | ICD-10-CM | POA: Diagnosis not present

## 2020-04-12 DIAGNOSIS — N186 End stage renal disease: Secondary | ICD-10-CM | POA: Diagnosis not present

## 2020-04-12 DIAGNOSIS — E1140 Type 2 diabetes mellitus with diabetic neuropathy, unspecified: Secondary | ICD-10-CM | POA: Diagnosis not present

## 2020-04-13 DIAGNOSIS — M24522 Contracture, left elbow: Secondary | ICD-10-CM | POA: Diagnosis not present

## 2020-04-13 DIAGNOSIS — D649 Anemia, unspecified: Secondary | ICD-10-CM | POA: Diagnosis not present

## 2020-04-13 DIAGNOSIS — D696 Thrombocytopenia, unspecified: Secondary | ICD-10-CM | POA: Diagnosis not present

## 2020-04-13 DIAGNOSIS — D631 Anemia in chronic kidney disease: Secondary | ICD-10-CM | POA: Diagnosis not present

## 2020-04-13 DIAGNOSIS — A4101 Sepsis due to Methicillin susceptible Staphylococcus aureus: Secondary | ICD-10-CM | POA: Diagnosis not present

## 2020-04-13 DIAGNOSIS — N250 Renal osteodystrophy: Secondary | ICD-10-CM | POA: Diagnosis not present

## 2020-04-13 DIAGNOSIS — Z794 Long term (current) use of insulin: Secondary | ICD-10-CM | POA: Diagnosis not present

## 2020-04-13 DIAGNOSIS — Z89511 Acquired absence of right leg below knee: Secondary | ICD-10-CM | POA: Diagnosis not present

## 2020-04-13 DIAGNOSIS — I739 Peripheral vascular disease, unspecified: Secondary | ICD-10-CM | POA: Diagnosis not present

## 2020-04-13 DIAGNOSIS — F339 Major depressive disorder, recurrent, unspecified: Secondary | ICD-10-CM | POA: Diagnosis not present

## 2020-04-13 DIAGNOSIS — G9341 Metabolic encephalopathy: Secondary | ICD-10-CM | POA: Diagnosis not present

## 2020-04-13 DIAGNOSIS — N186 End stage renal disease: Secondary | ICD-10-CM | POA: Diagnosis not present

## 2020-04-13 DIAGNOSIS — E1122 Type 2 diabetes mellitus with diabetic chronic kidney disease: Secondary | ICD-10-CM | POA: Diagnosis not present

## 2020-04-13 DIAGNOSIS — E559 Vitamin D deficiency, unspecified: Secondary | ICD-10-CM | POA: Diagnosis not present

## 2020-04-13 DIAGNOSIS — I150 Renovascular hypertension: Secondary | ICD-10-CM | POA: Diagnosis not present

## 2020-04-13 DIAGNOSIS — I429 Cardiomyopathy, unspecified: Secondary | ICD-10-CM | POA: Diagnosis not present

## 2020-04-13 DIAGNOSIS — M150 Primary generalized (osteo)arthritis: Secondary | ICD-10-CM | POA: Diagnosis not present

## 2020-04-13 DIAGNOSIS — D509 Iron deficiency anemia, unspecified: Secondary | ICD-10-CM | POA: Diagnosis not present

## 2020-04-13 DIAGNOSIS — F322 Major depressive disorder, single episode, severe without psychotic features: Secondary | ICD-10-CM | POA: Diagnosis not present

## 2020-04-13 DIAGNOSIS — I69954 Hemiplegia and hemiparesis following unspecified cerebrovascular disease affecting left non-dominant side: Secondary | ICD-10-CM | POA: Diagnosis not present

## 2020-04-13 DIAGNOSIS — E1140 Type 2 diabetes mellitus with diabetic neuropathy, unspecified: Secondary | ICD-10-CM | POA: Diagnosis not present

## 2020-04-13 DIAGNOSIS — H16323 Diffuse interstitial keratitis, bilateral: Secondary | ICD-10-CM | POA: Diagnosis not present

## 2020-04-13 DIAGNOSIS — I132 Hypertensive heart and chronic kidney disease with heart failure and with stage 5 chronic kidney disease, or end stage renal disease: Secondary | ICD-10-CM | POA: Diagnosis not present

## 2020-04-13 DIAGNOSIS — I10 Essential (primary) hypertension: Secondary | ICD-10-CM | POA: Diagnosis not present

## 2020-04-13 DIAGNOSIS — H548 Legal blindness, as defined in USA: Secondary | ICD-10-CM | POA: Diagnosis not present

## 2020-04-13 DIAGNOSIS — Z8673 Personal history of transient ischemic attack (TIA), and cerebral infarction without residual deficits: Secondary | ICD-10-CM | POA: Diagnosis not present

## 2020-04-13 DIAGNOSIS — T8249XA Other complication of vascular dialysis catheter, initial encounter: Secondary | ICD-10-CM | POA: Diagnosis not present

## 2020-04-13 DIAGNOSIS — R569 Unspecified convulsions: Secondary | ICD-10-CM | POA: Diagnosis not present

## 2020-04-13 DIAGNOSIS — G4700 Insomnia, unspecified: Secondary | ICD-10-CM | POA: Diagnosis not present

## 2020-04-13 DIAGNOSIS — Z992 Dependence on renal dialysis: Secondary | ICD-10-CM | POA: Diagnosis not present

## 2020-04-13 DIAGNOSIS — R1312 Dysphagia, oropharyngeal phase: Secondary | ICD-10-CM | POA: Diagnosis not present

## 2020-04-13 DIAGNOSIS — J9611 Chronic respiratory failure with hypoxia: Secondary | ICD-10-CM | POA: Diagnosis not present

## 2020-04-13 DIAGNOSIS — L89322 Pressure ulcer of left buttock, stage 2: Secondary | ICD-10-CM | POA: Diagnosis not present

## 2020-04-13 DIAGNOSIS — J9621 Acute and chronic respiratory failure with hypoxia: Secondary | ICD-10-CM | POA: Diagnosis not present

## 2020-04-13 DIAGNOSIS — G40909 Epilepsy, unspecified, not intractable, without status epilepticus: Secondary | ICD-10-CM | POA: Diagnosis not present

## 2020-04-13 DIAGNOSIS — E119 Type 2 diabetes mellitus without complications: Secondary | ICD-10-CM | POA: Diagnosis not present

## 2020-04-13 DIAGNOSIS — E46 Unspecified protein-calorie malnutrition: Secondary | ICD-10-CM | POA: Diagnosis not present

## 2020-04-13 DIAGNOSIS — R1310 Dysphagia, unspecified: Secondary | ICD-10-CM | POA: Diagnosis not present

## 2020-04-13 DIAGNOSIS — N2581 Secondary hyperparathyroidism of renal origin: Secondary | ICD-10-CM | POA: Diagnosis not present

## 2020-04-13 DIAGNOSIS — I482 Chronic atrial fibrillation, unspecified: Secondary | ICD-10-CM | POA: Diagnosis not present

## 2020-04-13 DIAGNOSIS — M24542 Contracture, left hand: Secondary | ICD-10-CM | POA: Diagnosis not present

## 2020-04-13 DIAGNOSIS — I5022 Chronic systolic (congestive) heart failure: Secondary | ICD-10-CM | POA: Diagnosis not present

## 2020-04-13 DIAGNOSIS — Z9981 Dependence on supplemental oxygen: Secondary | ICD-10-CM | POA: Diagnosis not present

## 2020-04-13 DIAGNOSIS — Z89612 Acquired absence of left leg above knee: Secondary | ICD-10-CM | POA: Diagnosis not present

## 2020-04-14 DIAGNOSIS — E559 Vitamin D deficiency, unspecified: Secondary | ICD-10-CM | POA: Diagnosis not present

## 2020-04-14 DIAGNOSIS — D649 Anemia, unspecified: Secondary | ICD-10-CM | POA: Diagnosis not present

## 2020-04-14 DIAGNOSIS — I10 Essential (primary) hypertension: Secondary | ICD-10-CM | POA: Diagnosis not present

## 2020-04-14 DIAGNOSIS — E1122 Type 2 diabetes mellitus with diabetic chronic kidney disease: Secondary | ICD-10-CM | POA: Diagnosis not present

## 2020-04-14 DIAGNOSIS — E119 Type 2 diabetes mellitus without complications: Secondary | ICD-10-CM | POA: Diagnosis not present

## 2020-04-14 DIAGNOSIS — I132 Hypertensive heart and chronic kidney disease with heart failure and with stage 5 chronic kidney disease, or end stage renal disease: Secondary | ICD-10-CM | POA: Diagnosis not present

## 2020-04-14 DIAGNOSIS — I5022 Chronic systolic (congestive) heart failure: Secondary | ICD-10-CM | POA: Diagnosis not present

## 2020-04-14 DIAGNOSIS — G40909 Epilepsy, unspecified, not intractable, without status epilepticus: Secondary | ICD-10-CM | POA: Diagnosis not present

## 2020-04-14 DIAGNOSIS — N186 End stage renal disease: Secondary | ICD-10-CM | POA: Diagnosis not present

## 2020-04-15 DIAGNOSIS — T8249XA Other complication of vascular dialysis catheter, initial encounter: Secondary | ICD-10-CM | POA: Diagnosis not present

## 2020-04-15 DIAGNOSIS — N186 End stage renal disease: Secondary | ICD-10-CM | POA: Diagnosis not present

## 2020-04-15 DIAGNOSIS — N2581 Secondary hyperparathyroidism of renal origin: Secondary | ICD-10-CM | POA: Diagnosis not present

## 2020-04-15 DIAGNOSIS — D509 Iron deficiency anemia, unspecified: Secondary | ICD-10-CM | POA: Diagnosis not present

## 2020-04-15 DIAGNOSIS — D631 Anemia in chronic kidney disease: Secondary | ICD-10-CM | POA: Diagnosis not present

## 2020-04-18 DIAGNOSIS — N186 End stage renal disease: Secondary | ICD-10-CM | POA: Diagnosis not present

## 2020-04-18 DIAGNOSIS — D509 Iron deficiency anemia, unspecified: Secondary | ICD-10-CM | POA: Diagnosis not present

## 2020-04-18 DIAGNOSIS — N2581 Secondary hyperparathyroidism of renal origin: Secondary | ICD-10-CM | POA: Diagnosis not present

## 2020-04-18 DIAGNOSIS — D631 Anemia in chronic kidney disease: Secondary | ICD-10-CM | POA: Diagnosis not present

## 2020-04-18 DIAGNOSIS — T8249XA Other complication of vascular dialysis catheter, initial encounter: Secondary | ICD-10-CM | POA: Diagnosis not present

## 2020-04-19 DIAGNOSIS — I739 Peripheral vascular disease, unspecified: Secondary | ICD-10-CM | POA: Diagnosis not present

## 2020-04-19 DIAGNOSIS — E119 Type 2 diabetes mellitus without complications: Secondary | ICD-10-CM | POA: Diagnosis not present

## 2020-04-19 DIAGNOSIS — I5022 Chronic systolic (congestive) heart failure: Secondary | ICD-10-CM | POA: Diagnosis not present

## 2020-04-19 DIAGNOSIS — R569 Unspecified convulsions: Secondary | ICD-10-CM | POA: Diagnosis not present

## 2020-04-20 DIAGNOSIS — D509 Iron deficiency anemia, unspecified: Secondary | ICD-10-CM | POA: Diagnosis not present

## 2020-04-20 DIAGNOSIS — T8249XA Other complication of vascular dialysis catheter, initial encounter: Secondary | ICD-10-CM | POA: Diagnosis not present

## 2020-04-20 DIAGNOSIS — D631 Anemia in chronic kidney disease: Secondary | ICD-10-CM | POA: Diagnosis not present

## 2020-04-20 DIAGNOSIS — N186 End stage renal disease: Secondary | ICD-10-CM | POA: Diagnosis not present

## 2020-04-20 DIAGNOSIS — N2581 Secondary hyperparathyroidism of renal origin: Secondary | ICD-10-CM | POA: Diagnosis not present

## 2020-04-22 DIAGNOSIS — D509 Iron deficiency anemia, unspecified: Secondary | ICD-10-CM | POA: Diagnosis not present

## 2020-04-22 DIAGNOSIS — D631 Anemia in chronic kidney disease: Secondary | ICD-10-CM | POA: Diagnosis not present

## 2020-04-22 DIAGNOSIS — T8249XA Other complication of vascular dialysis catheter, initial encounter: Secondary | ICD-10-CM | POA: Diagnosis not present

## 2020-04-22 DIAGNOSIS — N2581 Secondary hyperparathyroidism of renal origin: Secondary | ICD-10-CM | POA: Diagnosis not present

## 2020-04-22 DIAGNOSIS — N186 End stage renal disease: Secondary | ICD-10-CM | POA: Diagnosis not present

## 2020-04-25 DIAGNOSIS — T8249XA Other complication of vascular dialysis catheter, initial encounter: Secondary | ICD-10-CM | POA: Diagnosis not present

## 2020-04-25 DIAGNOSIS — N2581 Secondary hyperparathyroidism of renal origin: Secondary | ICD-10-CM | POA: Diagnosis not present

## 2020-04-25 DIAGNOSIS — D509 Iron deficiency anemia, unspecified: Secondary | ICD-10-CM | POA: Diagnosis not present

## 2020-04-25 DIAGNOSIS — N186 End stage renal disease: Secondary | ICD-10-CM | POA: Diagnosis not present

## 2020-04-25 DIAGNOSIS — D631 Anemia in chronic kidney disease: Secondary | ICD-10-CM | POA: Diagnosis not present

## 2020-04-27 DIAGNOSIS — N2581 Secondary hyperparathyroidism of renal origin: Secondary | ICD-10-CM | POA: Diagnosis not present

## 2020-04-27 DIAGNOSIS — T8249XA Other complication of vascular dialysis catheter, initial encounter: Secondary | ICD-10-CM | POA: Diagnosis not present

## 2020-04-27 DIAGNOSIS — D631 Anemia in chronic kidney disease: Secondary | ICD-10-CM | POA: Diagnosis not present

## 2020-04-27 DIAGNOSIS — N186 End stage renal disease: Secondary | ICD-10-CM | POA: Diagnosis not present

## 2020-04-27 DIAGNOSIS — D509 Iron deficiency anemia, unspecified: Secondary | ICD-10-CM | POA: Diagnosis not present

## 2020-04-29 DIAGNOSIS — N186 End stage renal disease: Secondary | ICD-10-CM | POA: Diagnosis not present

## 2020-04-29 DIAGNOSIS — D509 Iron deficiency anemia, unspecified: Secondary | ICD-10-CM | POA: Diagnosis not present

## 2020-04-29 DIAGNOSIS — I5022 Chronic systolic (congestive) heart failure: Secondary | ICD-10-CM | POA: Diagnosis not present

## 2020-04-29 DIAGNOSIS — I739 Peripheral vascular disease, unspecified: Secondary | ICD-10-CM | POA: Diagnosis not present

## 2020-04-29 DIAGNOSIS — N2581 Secondary hyperparathyroidism of renal origin: Secondary | ICD-10-CM | POA: Diagnosis not present

## 2020-04-29 DIAGNOSIS — T8249XA Other complication of vascular dialysis catheter, initial encounter: Secondary | ICD-10-CM | POA: Diagnosis not present

## 2020-04-29 DIAGNOSIS — I150 Renovascular hypertension: Secondary | ICD-10-CM | POA: Diagnosis not present

## 2020-04-29 DIAGNOSIS — E119 Type 2 diabetes mellitus without complications: Secondary | ICD-10-CM | POA: Diagnosis not present

## 2020-04-29 DIAGNOSIS — D631 Anemia in chronic kidney disease: Secondary | ICD-10-CM | POA: Diagnosis not present

## 2020-05-03 DIAGNOSIS — D631 Anemia in chronic kidney disease: Secondary | ICD-10-CM | POA: Diagnosis not present

## 2020-05-03 DIAGNOSIS — N2581 Secondary hyperparathyroidism of renal origin: Secondary | ICD-10-CM | POA: Diagnosis not present

## 2020-05-03 DIAGNOSIS — D509 Iron deficiency anemia, unspecified: Secondary | ICD-10-CM | POA: Diagnosis not present

## 2020-05-03 DIAGNOSIS — N186 End stage renal disease: Secondary | ICD-10-CM | POA: Diagnosis not present

## 2020-05-03 DIAGNOSIS — T8249XA Other complication of vascular dialysis catheter, initial encounter: Secondary | ICD-10-CM | POA: Diagnosis not present

## 2020-05-06 DIAGNOSIS — D631 Anemia in chronic kidney disease: Secondary | ICD-10-CM | POA: Diagnosis not present

## 2020-05-06 DIAGNOSIS — D509 Iron deficiency anemia, unspecified: Secondary | ICD-10-CM | POA: Diagnosis not present

## 2020-05-06 DIAGNOSIS — N186 End stage renal disease: Secondary | ICD-10-CM | POA: Diagnosis not present

## 2020-05-06 DIAGNOSIS — N2581 Secondary hyperparathyroidism of renal origin: Secondary | ICD-10-CM | POA: Diagnosis not present

## 2020-05-06 DIAGNOSIS — T8249XA Other complication of vascular dialysis catheter, initial encounter: Secondary | ICD-10-CM | POA: Diagnosis not present

## 2020-05-09 DIAGNOSIS — T8249XA Other complication of vascular dialysis catheter, initial encounter: Secondary | ICD-10-CM | POA: Diagnosis not present

## 2020-05-09 DIAGNOSIS — N186 End stage renal disease: Secondary | ICD-10-CM | POA: Diagnosis not present

## 2020-05-09 DIAGNOSIS — D509 Iron deficiency anemia, unspecified: Secondary | ICD-10-CM | POA: Diagnosis not present

## 2020-05-09 DIAGNOSIS — D631 Anemia in chronic kidney disease: Secondary | ICD-10-CM | POA: Diagnosis not present

## 2020-05-09 DIAGNOSIS — N2581 Secondary hyperparathyroidism of renal origin: Secondary | ICD-10-CM | POA: Diagnosis not present

## 2020-05-11 DIAGNOSIS — D631 Anemia in chronic kidney disease: Secondary | ICD-10-CM | POA: Diagnosis not present

## 2020-05-11 DIAGNOSIS — N186 End stage renal disease: Secondary | ICD-10-CM | POA: Diagnosis not present

## 2020-05-11 DIAGNOSIS — I10 Essential (primary) hypertension: Secondary | ICD-10-CM | POA: Diagnosis not present

## 2020-05-11 DIAGNOSIS — D509 Iron deficiency anemia, unspecified: Secondary | ICD-10-CM | POA: Diagnosis not present

## 2020-05-11 DIAGNOSIS — N2581 Secondary hyperparathyroidism of renal origin: Secondary | ICD-10-CM | POA: Diagnosis not present

## 2020-05-12 DIAGNOSIS — I132 Hypertensive heart and chronic kidney disease with heart failure and with stage 5 chronic kidney disease, or end stage renal disease: Secondary | ICD-10-CM | POA: Diagnosis not present

## 2020-05-12 DIAGNOSIS — F322 Major depressive disorder, single episode, severe without psychotic features: Secondary | ICD-10-CM | POA: Diagnosis not present

## 2020-05-12 DIAGNOSIS — M150 Primary generalized (osteo)arthritis: Secondary | ICD-10-CM | POA: Diagnosis not present

## 2020-05-12 DIAGNOSIS — E1122 Type 2 diabetes mellitus with diabetic chronic kidney disease: Secondary | ICD-10-CM | POA: Diagnosis not present

## 2020-05-12 DIAGNOSIS — I10 Essential (primary) hypertension: Secondary | ICD-10-CM | POA: Diagnosis not present

## 2020-05-12 DIAGNOSIS — E119 Type 2 diabetes mellitus without complications: Secondary | ICD-10-CM | POA: Diagnosis not present

## 2020-05-12 DIAGNOSIS — I5022 Chronic systolic (congestive) heart failure: Secondary | ICD-10-CM | POA: Diagnosis not present

## 2020-05-12 DIAGNOSIS — N186 End stage renal disease: Secondary | ICD-10-CM | POA: Diagnosis not present

## 2020-05-12 DIAGNOSIS — E1140 Type 2 diabetes mellitus with diabetic neuropathy, unspecified: Secondary | ICD-10-CM | POA: Diagnosis not present

## 2020-05-12 DIAGNOSIS — I150 Renovascular hypertension: Secondary | ICD-10-CM | POA: Diagnosis not present

## 2020-05-13 DIAGNOSIS — N2581 Secondary hyperparathyroidism of renal origin: Secondary | ICD-10-CM | POA: Diagnosis not present

## 2020-05-13 DIAGNOSIS — N186 End stage renal disease: Secondary | ICD-10-CM | POA: Diagnosis not present

## 2020-05-13 DIAGNOSIS — D631 Anemia in chronic kidney disease: Secondary | ICD-10-CM | POA: Diagnosis not present

## 2020-05-13 DIAGNOSIS — D509 Iron deficiency anemia, unspecified: Secondary | ICD-10-CM | POA: Diagnosis not present

## 2020-05-16 DIAGNOSIS — N186 End stage renal disease: Secondary | ICD-10-CM | POA: Diagnosis not present

## 2020-05-16 DIAGNOSIS — R52 Pain, unspecified: Secondary | ICD-10-CM | POA: Diagnosis not present

## 2020-05-16 DIAGNOSIS — D631 Anemia in chronic kidney disease: Secondary | ICD-10-CM | POA: Diagnosis not present

## 2020-05-16 DIAGNOSIS — Z7401 Bed confinement status: Secondary | ICD-10-CM | POA: Diagnosis not present

## 2020-05-16 DIAGNOSIS — R6889 Other general symptoms and signs: Secondary | ICD-10-CM | POA: Diagnosis not present

## 2020-05-16 DIAGNOSIS — Z992 Dependence on renal dialysis: Secondary | ICD-10-CM | POA: Diagnosis not present

## 2020-05-16 DIAGNOSIS — D509 Iron deficiency anemia, unspecified: Secondary | ICD-10-CM | POA: Diagnosis not present

## 2020-05-16 DIAGNOSIS — I444 Left anterior fascicular block: Secondary | ICD-10-CM | POA: Diagnosis not present

## 2020-05-16 DIAGNOSIS — Z743 Need for continuous supervision: Secondary | ICD-10-CM | POA: Diagnosis not present

## 2020-05-16 DIAGNOSIS — E861 Hypovolemia: Secondary | ICD-10-CM | POA: Diagnosis not present

## 2020-05-16 DIAGNOSIS — R404 Transient alteration of awareness: Secondary | ICD-10-CM | POA: Diagnosis not present

## 2020-05-16 DIAGNOSIS — I9589 Other hypotension: Secondary | ICD-10-CM | POA: Diagnosis not present

## 2020-05-16 DIAGNOSIS — I959 Hypotension, unspecified: Secondary | ICD-10-CM | POA: Diagnosis not present

## 2020-05-16 DIAGNOSIS — E875 Hyperkalemia: Secondary | ICD-10-CM | POA: Diagnosis not present

## 2020-05-16 DIAGNOSIS — I120 Hypertensive chronic kidney disease with stage 5 chronic kidney disease or end stage renal disease: Secondary | ICD-10-CM | POA: Diagnosis not present

## 2020-05-16 DIAGNOSIS — N2581 Secondary hyperparathyroidism of renal origin: Secondary | ICD-10-CM | POA: Diagnosis not present

## 2020-05-16 DIAGNOSIS — M2550 Pain in unspecified joint: Secondary | ICD-10-CM | POA: Diagnosis not present

## 2020-05-17 DIAGNOSIS — I444 Left anterior fascicular block: Secondary | ICD-10-CM | POA: Diagnosis not present

## 2020-05-18 DIAGNOSIS — D631 Anemia in chronic kidney disease: Secondary | ICD-10-CM | POA: Diagnosis not present

## 2020-05-18 DIAGNOSIS — N2581 Secondary hyperparathyroidism of renal origin: Secondary | ICD-10-CM | POA: Diagnosis not present

## 2020-05-18 DIAGNOSIS — D509 Iron deficiency anemia, unspecified: Secondary | ICD-10-CM | POA: Diagnosis not present

## 2020-05-18 DIAGNOSIS — N186 End stage renal disease: Secondary | ICD-10-CM | POA: Diagnosis not present

## 2020-05-20 DIAGNOSIS — D631 Anemia in chronic kidney disease: Secondary | ICD-10-CM | POA: Diagnosis not present

## 2020-05-20 DIAGNOSIS — N2581 Secondary hyperparathyroidism of renal origin: Secondary | ICD-10-CM | POA: Diagnosis not present

## 2020-05-20 DIAGNOSIS — N186 End stage renal disease: Secondary | ICD-10-CM | POA: Diagnosis not present

## 2020-05-20 DIAGNOSIS — D509 Iron deficiency anemia, unspecified: Secondary | ICD-10-CM | POA: Diagnosis not present

## 2020-05-23 DIAGNOSIS — L89322 Pressure ulcer of left buttock, stage 2: Secondary | ICD-10-CM | POA: Diagnosis not present

## 2020-05-24 DIAGNOSIS — G40909 Epilepsy, unspecified, not intractable, without status epilepticus: Secondary | ICD-10-CM | POA: Diagnosis not present

## 2020-05-24 DIAGNOSIS — I63413 Cerebral infarction due to embolism of bilateral middle cerebral arteries: Secondary | ICD-10-CM | POA: Diagnosis not present

## 2020-05-24 DIAGNOSIS — I69398 Other sequelae of cerebral infarction: Secondary | ICD-10-CM | POA: Diagnosis not present

## 2020-05-24 DIAGNOSIS — I639 Cerebral infarction, unspecified: Secondary | ICD-10-CM | POA: Diagnosis not present

## 2020-05-24 DIAGNOSIS — N186 End stage renal disease: Secondary | ICD-10-CM | POA: Diagnosis not present

## 2020-05-25 DIAGNOSIS — N186 End stage renal disease: Secondary | ICD-10-CM | POA: Diagnosis not present

## 2020-05-25 DIAGNOSIS — D509 Iron deficiency anemia, unspecified: Secondary | ICD-10-CM | POA: Diagnosis not present

## 2020-05-25 DIAGNOSIS — N2581 Secondary hyperparathyroidism of renal origin: Secondary | ICD-10-CM | POA: Diagnosis not present

## 2020-05-25 DIAGNOSIS — D631 Anemia in chronic kidney disease: Secondary | ICD-10-CM | POA: Diagnosis not present

## 2020-05-26 DIAGNOSIS — G40909 Epilepsy, unspecified, not intractable, without status epilepticus: Secondary | ICD-10-CM | POA: Diagnosis not present

## 2020-05-26 DIAGNOSIS — I5022 Chronic systolic (congestive) heart failure: Secondary | ICD-10-CM | POA: Diagnosis not present

## 2020-05-26 DIAGNOSIS — I69354 Hemiplegia and hemiparesis following cerebral infarction affecting left non-dominant side: Secondary | ICD-10-CM | POA: Diagnosis not present

## 2020-05-26 DIAGNOSIS — N186 End stage renal disease: Secondary | ICD-10-CM | POA: Diagnosis not present

## 2020-05-27 DIAGNOSIS — N2581 Secondary hyperparathyroidism of renal origin: Secondary | ICD-10-CM | POA: Diagnosis not present

## 2020-05-27 DIAGNOSIS — D631 Anemia in chronic kidney disease: Secondary | ICD-10-CM | POA: Diagnosis not present

## 2020-05-27 DIAGNOSIS — N186 End stage renal disease: Secondary | ICD-10-CM | POA: Diagnosis not present

## 2020-05-27 DIAGNOSIS — D509 Iron deficiency anemia, unspecified: Secondary | ICD-10-CM | POA: Diagnosis not present

## 2020-05-30 DIAGNOSIS — L89322 Pressure ulcer of left buttock, stage 2: Secondary | ICD-10-CM | POA: Diagnosis not present

## 2020-06-01 DIAGNOSIS — D509 Iron deficiency anemia, unspecified: Secondary | ICD-10-CM | POA: Diagnosis not present

## 2020-06-01 DIAGNOSIS — N186 End stage renal disease: Secondary | ICD-10-CM | POA: Diagnosis not present

## 2020-06-01 DIAGNOSIS — N2581 Secondary hyperparathyroidism of renal origin: Secondary | ICD-10-CM | POA: Diagnosis not present

## 2020-06-01 DIAGNOSIS — D631 Anemia in chronic kidney disease: Secondary | ICD-10-CM | POA: Diagnosis not present

## 2020-06-03 DIAGNOSIS — D509 Iron deficiency anemia, unspecified: Secondary | ICD-10-CM | POA: Diagnosis not present

## 2020-06-03 DIAGNOSIS — D631 Anemia in chronic kidney disease: Secondary | ICD-10-CM | POA: Diagnosis not present

## 2020-06-03 DIAGNOSIS — N186 End stage renal disease: Secondary | ICD-10-CM | POA: Diagnosis not present

## 2020-06-03 DIAGNOSIS — N2581 Secondary hyperparathyroidism of renal origin: Secondary | ICD-10-CM | POA: Diagnosis not present

## 2020-06-06 DIAGNOSIS — L22 Diaper dermatitis: Secondary | ICD-10-CM | POA: Diagnosis not present

## 2020-06-06 DIAGNOSIS — D631 Anemia in chronic kidney disease: Secondary | ICD-10-CM | POA: Diagnosis not present

## 2020-06-06 DIAGNOSIS — D509 Iron deficiency anemia, unspecified: Secondary | ICD-10-CM | POA: Diagnosis not present

## 2020-06-06 DIAGNOSIS — N2581 Secondary hyperparathyroidism of renal origin: Secondary | ICD-10-CM | POA: Diagnosis not present

## 2020-06-06 DIAGNOSIS — N186 End stage renal disease: Secondary | ICD-10-CM | POA: Diagnosis not present

## 2020-06-08 DIAGNOSIS — N186 End stage renal disease: Secondary | ICD-10-CM | POA: Diagnosis not present

## 2020-06-08 DIAGNOSIS — D509 Iron deficiency anemia, unspecified: Secondary | ICD-10-CM | POA: Diagnosis not present

## 2020-06-08 DIAGNOSIS — D631 Anemia in chronic kidney disease: Secondary | ICD-10-CM | POA: Diagnosis not present

## 2020-06-08 DIAGNOSIS — N2581 Secondary hyperparathyroidism of renal origin: Secondary | ICD-10-CM | POA: Diagnosis not present

## 2020-06-09 DIAGNOSIS — N186 End stage renal disease: Secondary | ICD-10-CM | POA: Diagnosis not present

## 2020-06-09 DIAGNOSIS — I69351 Hemiplegia and hemiparesis following cerebral infarction affecting right dominant side: Secondary | ICD-10-CM | POA: Diagnosis not present

## 2020-06-09 DIAGNOSIS — I10 Essential (primary) hypertension: Secondary | ICD-10-CM | POA: Diagnosis not present

## 2020-06-09 DIAGNOSIS — I5022 Chronic systolic (congestive) heart failure: Secondary | ICD-10-CM | POA: Diagnosis not present

## 2020-06-10 DIAGNOSIS — N186 End stage renal disease: Secondary | ICD-10-CM | POA: Diagnosis not present

## 2020-06-10 DIAGNOSIS — D509 Iron deficiency anemia, unspecified: Secondary | ICD-10-CM | POA: Diagnosis not present

## 2020-06-10 DIAGNOSIS — N2581 Secondary hyperparathyroidism of renal origin: Secondary | ICD-10-CM | POA: Diagnosis not present

## 2020-06-10 DIAGNOSIS — D631 Anemia in chronic kidney disease: Secondary | ICD-10-CM | POA: Diagnosis not present

## 2020-08-09 DEATH — deceased
